# Patient Record
Sex: Female | Born: 1950 | Race: White | Hispanic: No | Marital: Married | Smoking: Never smoker
Health system: Southern US, Community
[De-identification: ages and names within clinical notes are randomized; demographics above are authoritative.]

## PROBLEM LIST (undated history)

## (undated) DIAGNOSIS — I10 Essential (primary) hypertension: Secondary | ICD-10-CM

## (undated) DIAGNOSIS — R233 Spontaneous ecchymoses: Secondary | ICD-10-CM

## (undated) DIAGNOSIS — Z98811 Dental restoration status: Secondary | ICD-10-CM

## (undated) DIAGNOSIS — I498 Other specified cardiac arrhythmias: Secondary | ICD-10-CM

## (undated) DIAGNOSIS — I499 Cardiac arrhythmia, unspecified: Secondary | ICD-10-CM

## (undated) DIAGNOSIS — Z8719 Personal history of other diseases of the digestive system: Secondary | ICD-10-CM

## (undated) DIAGNOSIS — R238 Other skin changes: Secondary | ICD-10-CM

## (undated) DIAGNOSIS — C50919 Malignant neoplasm of unspecified site of unspecified female breast: Secondary | ICD-10-CM

## (undated) DIAGNOSIS — J302 Other seasonal allergic rhinitis: Secondary | ICD-10-CM

## (undated) DIAGNOSIS — Z923 Personal history of irradiation: Secondary | ICD-10-CM

## (undated) DIAGNOSIS — N6022 Fibroadenosis of left breast: Secondary | ICD-10-CM

## (undated) HISTORY — DX: Other specified cardiac arrhythmias: I49.8

## (undated) HISTORY — PX: ABDOMINAL HYSTERECTOMY: SHX81

## (undated) HISTORY — DX: Cardiac arrhythmia, unspecified: I49.9

## (undated) HISTORY — PX: CATARACT EXTRACTION W/ INTRAOCULAR LENS  IMPLANT, BILATERAL: SHX1307

## (undated) HISTORY — DX: Essential (primary) hypertension: I10

## (undated) HISTORY — PX: TONSILLECTOMY: SUR1361

---

## 1999-04-29 ENCOUNTER — Other Ambulatory Visit: Admission: RE | Admit: 1999-04-29 | Discharge: 1999-04-29 | Payer: Self-pay | Admitting: Obstetrics and Gynecology

## 1999-07-04 ENCOUNTER — Encounter: Admission: RE | Admit: 1999-07-04 | Discharge: 1999-10-02 | Payer: Self-pay | Admitting: Obstetrics and Gynecology

## 2000-06-25 ENCOUNTER — Other Ambulatory Visit: Admission: RE | Admit: 2000-06-25 | Discharge: 2000-06-25 | Payer: Self-pay | Admitting: Obstetrics and Gynecology

## 2002-09-15 ENCOUNTER — Other Ambulatory Visit: Admission: RE | Admit: 2002-09-15 | Discharge: 2002-09-15 | Payer: Self-pay | Admitting: Obstetrics and Gynecology

## 2004-10-06 ENCOUNTER — Encounter: Admission: RE | Admit: 2004-10-06 | Discharge: 2004-10-06 | Payer: Self-pay | Admitting: Family Medicine

## 2006-01-18 ENCOUNTER — Other Ambulatory Visit: Admission: RE | Admit: 2006-01-18 | Discharge: 2006-01-18 | Payer: Self-pay | Admitting: Obstetrics and Gynecology

## 2008-10-16 ENCOUNTER — Encounter: Admission: RE | Admit: 2008-10-16 | Discharge: 2008-10-16 | Payer: Self-pay | Admitting: Emergency Medicine

## 2009-02-04 ENCOUNTER — Encounter: Admission: RE | Admit: 2009-02-04 | Discharge: 2009-02-04 | Payer: Self-pay | Admitting: Sports Medicine

## 2011-10-20 ENCOUNTER — Emergency Department (INDEPENDENT_AMBULATORY_CARE_PROVIDER_SITE_OTHER): Payer: BC Managed Care – PPO

## 2011-10-20 ENCOUNTER — Encounter (HOSPITAL_BASED_OUTPATIENT_CLINIC_OR_DEPARTMENT_OTHER): Payer: Self-pay | Admitting: *Deleted

## 2011-10-20 ENCOUNTER — Emergency Department (HOSPITAL_BASED_OUTPATIENT_CLINIC_OR_DEPARTMENT_OTHER)
Admission: EM | Admit: 2011-10-20 | Discharge: 2011-10-20 | Disposition: A | Discharge status: Home / Self Care | Payer: BC Managed Care – PPO | Attending: Emergency Medicine | Admitting: Emergency Medicine

## 2011-10-20 DIAGNOSIS — R1032 Left lower quadrant pain: Secondary | ICD-10-CM

## 2011-10-20 DIAGNOSIS — K5732 Diverticulitis of large intestine without perforation or abscess without bleeding: Secondary | ICD-10-CM | POA: Insufficient documentation

## 2011-10-20 DIAGNOSIS — Z79899 Other long term (current) drug therapy: Secondary | ICD-10-CM | POA: Insufficient documentation

## 2011-10-20 LAB — DIFFERENTIAL
Basophils Absolute: 0.1 10*3/uL (ref 0.0–0.1)
Basophils Relative: 2 % — ABNORMAL HIGH (ref 0–1)
Eosinophils Absolute: 0.2 10*3/uL (ref 0.0–0.7)
Monocytes Absolute: 0.5 10*3/uL (ref 0.1–1.0)
Monocytes Relative: 9 % (ref 3–12)
Neutrophils Relative %: 51 % (ref 43–77)

## 2011-10-20 LAB — CBC
Hemoglobin: 13.6 g/dL (ref 12.0–15.0)
MCH: 33.3 pg (ref 26.0–34.0)
MCHC: 34.3 g/dL (ref 30.0–36.0)
RDW: 11.9 % (ref 11.5–15.5)

## 2011-10-20 LAB — URINALYSIS, ROUTINE W REFLEX MICROSCOPIC
Bilirubin Urine: NEGATIVE
Ketones, ur: 15 mg/dL — AB
Leukocytes, UA: NEGATIVE
Nitrite: NEGATIVE
Protein, ur: NEGATIVE mg/dL
pH: 6.5 (ref 5.0–8.0)

## 2011-10-20 LAB — OCCULT BLOOD X 1 CARD TO LAB, STOOL: Fecal Occult Bld: NEGATIVE

## 2011-10-20 LAB — COMPREHENSIVE METABOLIC PANEL
AST: 25 U/L (ref 0–37)
Albumin: 4.4 g/dL (ref 3.5–5.2)
BUN: 9 mg/dL (ref 6–23)
Creatinine, Ser: 0.7 mg/dL (ref 0.50–1.10)
Potassium: 3.5 mEq/L (ref 3.5–5.1)
Total Protein: 7.7 g/dL (ref 6.0–8.3)

## 2011-10-20 MED ORDER — ONDANSETRON 8 MG PO TBDP: 8.0000 mg | ORAL_TABLET | Freq: Three times a day (TID) | ORAL | Status: AC | PRN

## 2011-10-20 MED ORDER — ONDANSETRON HCL 4 MG/2ML IJ SOLN
4.0000 mg | Freq: Once | INTRAMUSCULAR | Status: AC
Start: 2011-10-20 — End: 2011-10-20
  Administered 2011-10-20: 4 mg via INTRAVENOUS
  Filled 2011-10-20: qty 2

## 2011-10-20 MED ORDER — SODIUM CHLORIDE 0.9 % IV BOLUS (SEPSIS)
1000.0000 mL | Freq: Once | INTRAVENOUS | Status: AC
  Administered 2011-10-20: 1000 mL via INTRAVENOUS

## 2011-10-20 MED ORDER — HYDROMORPHONE HCL PF 1 MG/ML IJ SOLN
1.0000 mg | Freq: Once | INTRAMUSCULAR | Status: AC
  Administered 2011-10-20: 1 mg via INTRAVENOUS
  Filled 2011-10-20: qty 1

## 2011-10-20 MED ORDER — IOHEXOL 300 MG/ML  SOLN
100.0000 mL | Freq: Once | INTRAMUSCULAR | Status: AC | PRN
  Administered 2011-10-20: 100 mL via INTRAVENOUS

## 2011-10-20 MED ORDER — IOHEXOL 300 MG/ML  SOLN
40.0000 mL | Freq: Once | INTRAMUSCULAR | Status: AC | PRN
  Administered 2011-10-20: 40 mL via ORAL

## 2011-10-20 MED ORDER — OXYCODONE-ACETAMINOPHEN 5-325 MG PO TABS
1.0000 | ORAL_TABLET | Freq: Once | ORAL | Status: AC
  Administered 2011-10-20: 1 via ORAL
  Filled 2011-10-20: qty 1

## 2011-10-20 MED ORDER — OXYCODONE-ACETAMINOPHEN 5-325 MG PO TABS: 2.0000 | ORAL_TABLET | ORAL | Status: AC | PRN

## 2011-10-20 NOTE — ED Provider Notes (Addendum)
History     CSN: [id number]  Arrival date & time 10/20/11  2002   First MD Initiated Contact with Patient 10/20/11 2026      Chief Complaint  Patient presents with  . Abdominal Pain    (Consider location/radiation/quality/duration/timing/severity/associated sxs/prior treatment) HPI Patient with nausea and abdominal pain beginning 12/30.  Patient seen at [facility] walk in clinic that same day and diagnose with diverticulitis after blood work and urinalysis.  Patient placed on cipro and flagyl for 10 days.  The sharp pain subsided and continues to have llq pain.  Patient seen for follow up 1/11 by her pmd Dr. Paulino Rily.  Patient continued to have pain and was told to call in two weeks.  Yesterday pain worsened.  Called her doctor yesterday morning and they called back and told her to go to ed- patient did not go due to weather.  Pain continues sever todaya nd went to walk in clinic and they sent here for test.  Current pain 7, has been 8. Past Medical History  Diagnosis Date  . Diverticular disease     Past Surgical History  Procedure Date  . Abdominal hysterectomy   . Tonsillectomy     No family history on file.  History  Substance Use Topics  . Smoking status: Never Smoker   . Smokeless tobacco: Not on file  . Alcohol Use: No    OB History    Grav Para Term Preterm Abortions TAB SAB Ect Mult Living                  Review of Systems  Constitutional: Positive for appetite change. Negative for fever and unexpected weight change.  Respiratory: Negative for cough and shortness of breath.   Genitourinary: Negative for dysuria and difficulty urinating.  All other systems reviewed and are negative.    Allergies  Atenolol hydrochloride; Bisoprolol; and Prednisone  Home Medications   Current Outpatient Rx  Name Route Sig Dispense Refill  . ALPRAZOLAM 0.25 MG PO TABS Oral Take 0.125-0.25 mg by mouth 3 (three) times daily. Take 1/2 tab in the morning and afternoon and take 1  tab at bedtime    . B COMPLEX PO TABS Oral Take 1 tablet by mouth daily.    Marland Kitchen CALCIUM CITRATE 250 MG PO TABS Oral Take 250 mg by mouth 4 (four) times daily.    Marland Kitchen VITAMIN D-3 5000 UNITS PO TABS Oral Take 1 tablet by mouth daily.    Marland Kitchen ESTROGENS CONJUGATED 0.625 MG PO TABS Oral Take 0.625 mg by mouth daily. Take daily for 21 days then do not take for 7 days.    . RESTORA PO CAPS Oral Take 1 capsule by mouth daily.      BP 154/64  Pulse 80  Temp(Src) 97.8 F (36.6 C) (Oral)  Resp 20  SpO2 100%  Physical Exam  Nursing note and vitals reviewed. Constitutional: She is oriented to person, place, and time. She appears well-developed and well-nourished.  HENT:  Head: Normocephalic and atraumatic.  Cardiovascular: Normal rate.   Pulmonary/Chest: Effort normal and breath sounds normal.  Abdominal: There is tenderness.    Musculoskeletal: Normal range of motion.  Neurological: She is alert and oriented to person, place, and time. She has normal reflexes.  Skin: Skin is warm and dry.  Psychiatric: She has a normal mood and affect. Her behavior is normal. Judgment and thought content normal.    ED Course  Procedures (including critical care time)  Labs Reviewed  URINALYSIS, ROUTINE W REFLEX MICROSCOPIC - Abnormal; Notable for the following:    Ketones, ur 15 (*)    All other components within normal limits   No results found.   No diagnosis found.         Patient without acute abnormality on labs or ct- she feels much better after one dose of pain medicine and has taken po without emesis.     Hilario Quarry, MD 10/21/11 7829  Hilario Quarry, MD 10/21/11 5621  Hilario Quarry, MD 10/21/11 [phone]

## 2011-10-20 NOTE — ED Notes (Signed)
Abdominal pain lower left quad. Pain started December 30th. This is her 4th MD visit per pt. She was diagnosed with diverticulitis.

## 2012-04-15 ENCOUNTER — Other Ambulatory Visit: Payer: Self-pay

## 2012-05-29 ENCOUNTER — Telehealth: Payer: Self-pay | Admitting: Obstetrics and Gynecology

## 2012-05-29 MED ORDER — ESTROGENS CONJUGATED 0.625 MG PO TABS: 0.6250 mg | ORAL_TABLET | Freq: Every day | ORAL | Status: DC

## 2012-05-29 NOTE — Telephone Encounter (Signed)
Refill premarin. AEX sch for 06-18-12.  ld

## 2012-06-07 ENCOUNTER — Encounter: Payer: Self-pay | Admitting: Gastroenterology

## 2012-06-14 ENCOUNTER — Ambulatory Visit (INDEPENDENT_AMBULATORY_CARE_PROVIDER_SITE_OTHER): Payer: BC Managed Care – PPO | Admitting: Gastroenterology

## 2012-06-14 ENCOUNTER — Encounter: Payer: Self-pay | Admitting: Gastroenterology

## 2012-06-14 VITALS — BP 128/80 | HR 60 | Ht 61.0 in | Wt 115.0 lb

## 2012-06-14 DIAGNOSIS — N949 Unspecified condition associated with female genital organs and menstrual cycle: Secondary | ICD-10-CM

## 2012-06-14 DIAGNOSIS — R102 Pelvic and perineal pain: Secondary | ICD-10-CM

## 2012-06-14 MED ORDER — HYOSCYAMINE SULFATE ER 0.375 MG PO TB12: 0.3750 mg | ORAL_TABLET | Freq: Two times a day (BID) | ORAL | Status: DC

## 2012-06-14 NOTE — Progress Notes (Signed)
HPI: This is a  pleasant 61 year old woman whom I am meeting for the first time today  She had a Colonoscopy 03/2010 with Dr. Loreta Ave for [facility] screening.  Normal examination to TI, was recommended recall colonoscoyp at 10 year interval.  Has left sided abdominal burning, "on fire."  Worse with eating any types of food.   Hurts very lower left, almost low pelvis.  The burning is nearly constant, 80% of the time. Started December 30th.  Having a BM helps the pains.  No bowel changes.  No nausea or vomiting.    Was given an antibiotic for this pain 8-9 months ago at [facility] walk in clinic, was told she had an infection based on bloodwork.  2-3 weeks later the pains returned (was done with antibiotic course) ; repeat labs (cbc, cmet, UA were all normal) and CT scan then abd pelvis with IV and po contrast was all normal.  Was given pain med and sent home.  No fevers or chills, not constipated.  NO overt gi bleeding.      Had hysterectomy, ovaries still in place.  Feels scraping, burning, irritating feeling, clearly worse with eating.    Review of systems: Pertinent positive and negative review of systems were noted in the above HPI section. Complete review of systems was performed and was otherwise normal.    Past Medical History  Diagnosis Date  . Diverticular disease   . H/O varicella   . H/O sinusitis 2011  . Hayfever 2011  . Left hip pain 2011  . H/O mumps   . Yeast vaginitis 2006    Past Surgical History  Procedure Date  . Tonsillectomy   . Abdominal hysterectomy     Current Outpatient Prescriptions  Medication Sig Dispense Refill  . ALPRAZolam (XANAX) 0.25 MG tablet Take 0.125-0.25 mg by mouth 3 (three) times daily. Take 1/2 tab in the morning and afternoon and take 1 tab at bedtime      . b complex vitamins tablet Take 1 tablet by mouth daily.      . Calcium Citrate 250 MG TABS Take 250 mg by mouth 4 (four) times daily.      . Cholecalciferol (VITAMIN D-3) 5000 UNITS TABS  Take 1 tablet by mouth daily.      Marland Kitchen estrogens, conjugated, (PREMARIN) 0.625 MG tablet Take 1 tablet (0.625 mg total) by mouth daily. Take daily for 21 days then do not take for 7 days.  21 tablet  0    Allergies as of 06/14/2012 - Review Complete 06/14/2012  Allergen Reaction Noted  . Atenolol hydrochloride  10/20/2011  . Bisoprolol  10/20/2011  . Prednisone  10/20/2011    Family History  Problem Relation Age of Onset  . Hypertension Sister   . Dementia Father     History   Social History  . Marital Status: Single    Spouse Name: N/A    Number of Children: N/A  . Years of Education: N/A   Occupational History  . 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 Smoking status: Never Smoker   . Smokeless tobacco: Never Used  . Alcohol Use: No  . Drug Use: No  . Sexually Active:    Other Topics Concern  . Not on file   Social History Narrative  . No narrative on file       Physical Exam: BP 128/80  Pulse 60  Ht 5\' 1"  (1.549 m)  Wt 115 lb (52.164 kg)  BMI 21.73 kg/m2 Constitutional: generally well-appearing Psychiatric: alert and oriented x3 Eyes: extraocular movements intact Mouth: oral pharynx moist, no lesions Neck: supple no lymphadenopathy Cardiovascular: heart regular rate and rhythm Lungs: clear to auscultation bilaterally Abdomen: soft, very mildly tender in the low left pelvis, nondistended, no obvious ascites, no peritoneal signs, normal bowel sounds Extremities: no lower extremity edema bilaterally Skin: no lesions on visible extremities    Assessment and plan: 61 y.o. female with  left sided pelvic pain, worse after eating  It is pretty unusual to have a pelvic pain that is clearly worse after eating.  Stomach and pelvic organs are generally quite removed from each other. She is very clear that this is the case however. She is seeing a gynecologist next week and I'm recommending she mention her low pelvic pain to then to  consider pelvic ultrasound. He did have her uterus removed years ago but still has ovaries. Perhaps this is an ovarian etiology. I'm giving her antispasm medicine to take twice daily and she will return to see me in 4 weeks' time. She had a colonoscopy by a different gastroenterologist in town 2 years ago and I don't think that needs to be repeated just now. Since her pain is postprandial, I would consider EGD if there is still no clear etiology after gynecologic visit and trial of antispasm medicines.

## 2012-06-14 NOTE — Patient Instructions (Addendum)
Angela Lay, MD gynecologist appointment already set for next week, please go ahead with that appointment and mention your pelvic pain.  This may be gynecologic related, consider pelvic ultrasound. Trial of antispasm medicine, take one pill twice daily. Return to see Dr. Christella Hartigan in 4-5 weeks to follow up on your We will call Dr. Paulino Rily office to get your recent bloodwork sent over. You may need upper endoscopy for the post prandial symptoms.

## 2012-06-18 ENCOUNTER — Other Ambulatory Visit: Payer: Self-pay | Admitting: Obstetrics and Gynecology

## 2012-06-18 ENCOUNTER — Ambulatory Visit (INDEPENDENT_AMBULATORY_CARE_PROVIDER_SITE_OTHER): Payer: BC Managed Care – PPO

## 2012-06-18 ENCOUNTER — Encounter: Payer: Self-pay | Admitting: Obstetrics and Gynecology

## 2012-06-18 ENCOUNTER — Ambulatory Visit (INDEPENDENT_AMBULATORY_CARE_PROVIDER_SITE_OTHER): Payer: BC Managed Care – PPO | Admitting: Obstetrics and Gynecology

## 2012-06-18 VITALS — BP 104/58 | Ht 62.0 in | Wt 112.0 lb

## 2012-06-18 DIAGNOSIS — R102 Pelvic and perineal pain: Secondary | ICD-10-CM

## 2012-06-18 DIAGNOSIS — N949 Unspecified condition associated with female genital organs and menstrual cycle: Secondary | ICD-10-CM

## 2012-06-18 DIAGNOSIS — Z01419 Encounter for gynecological examination (general) (routine) without abnormal findings: Secondary | ICD-10-CM

## 2012-06-18 MED ORDER — ESTROGENS CONJUGATED 0.625 MG PO TABS: 0.6250 mg | ORAL_TABLET | Freq: Every day | ORAL | Status: DC

## 2012-06-18 NOTE — Progress Notes (Signed)
The patient is taking hormone replacement therapy The patient  is taking a Calcium supplement. Post-menopausal bleeding:no Hysterectomy   Last Pap: was normal February  2010 Last mammogram: was normal September  2012 Last DEXA scan : T= 2.03 Feb 2006  Last colonoscopy:normal May 2011  Urinary symptoms: none Normal bowel movements: Yes Reports abuse at home: No  Subjective:    Angela Buchanan is a 61 y.o. female No obstetric history on file. who presents for annual exam.  The patient c/o LLQ pain: since 09/30/12, burn,6/10, continuous, improved with Levbid, worsened by eating. Sometimes associated with nausea. Saw Dr Christella Hartigan last week, no testing, says he suspects it is GYN origin.  The following portions of the patient's history were reviewed and updated as appropriate: allergies, current medications, past family history, past medical history, past social history, past surgical history and problem list.  Review of Systems Pertinent items are noted in HPI. Gastrointestinal:No change in bowel habits, no abdominal pain, no rectal bleeding Genitourinary:negative for dysuria, frequency, hematuria, nocturia and urinary incontinence    Objective:     BP 104/58  Ht 5\' 2"  (1.575 m)  Wt 112 lb (50.803 kg)  BMI 20.49 kg/m2  Weight:  Wt Readings from Last 1 Encounters:  06/18/12 112 lb (50.803 kg)     BMI: Body mass index is 20.49 kg/(m^2). General Appearance: Alert, appropriate appearance for age. No acute distress HEENT: Grossly normal Neck / Thyroid: Supple, no masses, nodes or enlargement Lungs: clear to auscultation bilaterally Back: No CVA tenderness Breast Exam: No masses or nodes.No dimpling, nipple retraction or discharge. Cardiovascular: Regular rate and rhythm. S1, S2, no murmur Gastrointestinal: Soft, non-tender, no masses or organomegaly Pelvic Exam:VV normal. Pelvic reveals normal adnexa Rectovaginal: normal rectal, no masses Lymphatic Exam: Non-palpable nodes in neck,  clavicular, axillary, or inguinal regions Skin: no rash or abnormalities Neurologic: Normal gait and speech, no tremor  Psychiatric: Alert and oriented, appropriate affect.     Assessment:    Pelvic pain most likely GI in origin. Pelvic ultrasound today is normal   Plan:   mammogram return annually or prn Pt to return to GI for management of pain Follow-up:  for annual exam

## 2012-06-19 ENCOUNTER — Telehealth: Payer: Self-pay | Admitting: Obstetrics and Gynecology

## 2012-06-19 NOTE — Telephone Encounter (Signed)
LINDA/RECIEVED

## 2012-06-19 NOTE — Telephone Encounter (Signed)
TC TO PT PHARMACY REGARDING RX PREMARIN 0.625 MG. WITH THE CORRECTED DOSAGE. PREMARIN 0.625MG  #30 1 PO DAILY WITH REFILLS FOR ONE YEAR. PHARMACY UNDERSTANDS.

## 2012-06-21 ENCOUNTER — Telehealth: Payer: Self-pay | Admitting: Gastroenterology

## 2012-06-21 NOTE — Telephone Encounter (Signed)
Reviewed outside labs. Dated 06/06/2012. CBC was essentially normal, complete metabolic profile was normal

## 2012-07-23 ENCOUNTER — Ambulatory Visit: Payer: BC Managed Care – PPO | Admitting: Gastroenterology

## 2013-12-02 ENCOUNTER — Other Ambulatory Visit: Payer: Self-pay | Admitting: Cardiology

## 2013-12-02 MED ORDER — LISINOPRIL 20 MG PO TABS: 20.0000 mg | ORAL_TABLET | Freq: Every day | ORAL | Status: DC

## 2014-03-18 ENCOUNTER — Ambulatory Visit: Payer: BC Managed Care – PPO | Admitting: Neurology

## 2014-04-07 ENCOUNTER — Encounter: Payer: Self-pay | Admitting: Interventional Cardiology

## 2014-04-17 ENCOUNTER — Ambulatory Visit: Payer: BC Managed Care – PPO | Admitting: Cardiovascular Disease

## 2014-04-22 ENCOUNTER — Ambulatory Visit: Payer: BC Managed Care – PPO | Admitting: Cardiovascular Disease

## 2014-05-05 ENCOUNTER — Ambulatory Visit: Payer: BC Managed Care – PPO | Admitting: Interventional Cardiology

## 2014-06-03 ENCOUNTER — Ambulatory Visit: Payer: BC Managed Care – PPO | Admitting: Cardiovascular Disease

## 2014-07-14 ENCOUNTER — Other Ambulatory Visit: Payer: Self-pay | Admitting: Family Medicine

## 2014-07-14 ENCOUNTER — Ambulatory Visit
Admission: RE | Admit: 2014-07-14 | Discharge: 2014-07-14 | Disposition: A | Discharge status: Home / Self Care | Payer: BC Managed Care – PPO | Source: Ambulatory Visit | Attending: Family Medicine | Admitting: Family Medicine

## 2014-07-14 DIAGNOSIS — R079 Chest pain, unspecified: Secondary | ICD-10-CM

## 2014-07-22 ENCOUNTER — Ambulatory Visit (INDEPENDENT_AMBULATORY_CARE_PROVIDER_SITE_OTHER): Payer: BC Managed Care – PPO | Admitting: Cardiovascular Disease

## 2014-07-22 ENCOUNTER — Encounter: Payer: Self-pay | Admitting: Cardiovascular Disease

## 2014-07-22 VITALS — BP 148/70 | HR 61 | Ht 62.0 in | Wt 114.1 lb

## 2014-07-22 DIAGNOSIS — R079 Chest pain, unspecified: Secondary | ICD-10-CM | POA: Insufficient documentation

## 2014-07-22 DIAGNOSIS — I10 Essential (primary) hypertension: Secondary | ICD-10-CM

## 2014-07-22 DIAGNOSIS — Z8249 Family history of ischemic heart disease and other diseases of the circulatory system: Secondary | ICD-10-CM | POA: Insufficient documentation

## 2014-07-22 NOTE — Progress Notes (Signed)
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 She was referred by Angela Buchanan for cardiovascular evaluation because of chest pain. Risk factors include treated hypertension family history with a brother who had a myocardial infarction at age 34. She's never had a heart attack or stroke. She was evaluated by Angela Buchanan several years ago who did a an exercise stress test that showed ST segment depression. A followup imaging study was recommended but never pursued. Her pain occurs daily. It is substernal. It does not radiate.it is worse when she sits up and not to sleep on by exertion.   Current Outpatient Prescriptions  Medication Sig Dispense Refill  . ALPRAZolam (XANAX) 0.25 MG tablet Take 0.125-0.25 mg by mouth as needed.       Marland Kitchen b complex vitamins tablet Take 1 tablet by mouth daily.      . Calcium Citrate 250 MG TABS Take 250 mg by mouth 4 (four) times daily.      . Cholecalciferol (VITAMIN D-3) 5000 UNITS TABS Take 1 tablet by mouth daily.      Marland Kitchen estrogens, conjugated, (PREMARIN) 0.625 MG tablet Take 1 tablet (0.625 mg total) by mouth daily. Take daily for 21 days then do not take for 7 days.  21 tablet  11  . lisinopril (PRINIVIL,ZESTRIL) 20 MG tablet Take 1 tablet (20 mg total) by mouth daily.  90 tablet  1  . PAZEO 0.7 % SOLN Place 1 drop into the right eye as needed.       No current facility-administered medications for this visit.    Allergies  Allergen Reactions  . Atenolol Hydrochloride     Gas, heartburn, headache  . Bisoprolol     Feels weird  . Prednisone     Feels weird     History   Social History  . Marital Status: Single    Spouse Name: N/A    Number of Children: N/A  . Years of Education: N/A    Occupational History  . 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 Smoking status: Never Smoker   . Smokeless tobacco: Never Used  . Alcohol Use: No  . Drug Use: No  . Sexual Activity: No     Comment: hyst   Other Topics Concern  . Not on file   Social History Narrative  . No narrative on file     Review of Systems: General: negative for chills, fever, night sweats or weight changes.  Cardiovascular: negative for chest pain, dyspnea on exertion, edema, orthopnea, palpitations, paroxysmal nocturnal dyspnea or shortness of breath Dermatological: negative for rash Respiratory: negative for cough or wheezing Urologic: negative for hematuria Abdominal: negative for nausea, vomiting, diarrhea, bright red blood per rectum, melena, or hematemesis Neurologic: negative for visual changes, syncope, or dizziness All other systems reviewed and are otherwise negative except as noted above.    Blood pressure 148/70, pulse 61, height 5\' 2"  (1.575 m), weight 114 lb 1.6 oz (51.755 kg).  General appearance: alert and no distress Neck: no adenopathy, no carotid bruit, no JVD, supple, symmetrical, trachea midline and thyroid not enlarged, symmetric, no tenderness/mass/nodules Lungs: clear to auscultation bilaterally Heart: regular rate and rhythm, S1, S2 normal, no murmur, click, rub  or gallop Extremities: extremities normal, atraumatic, no cyanosis or edema  EKG normal sinus rhythm at 61 with bigeminal PVCs  ASSESSMENT AND PLAN:   Chest pain The patient has had chest pain for last several years. It occurs daily and worse when she sits up. She did have a exercise stress test performed by Angela Buchanan 10/29/12 that did show ST segment depression and a nuclear imaging study was recommended but never pursued. Her risk factors for heart disease include family history and hypertension. I'm going to get a from a cardiac Myoview stress test to rule out an ischemic  etiology.      Angela Harp MD FACP,FACC,FAHA, [facility] 07/22/2014 12:07 PM

## 2014-07-22 NOTE — Assessment & Plan Note (Signed)
The patient has had chest pain for last several years. It occurs daily and worse when she sits up. She did have a exercise stress test performed by Dr. Irish Lack 10/29/12 that did show ST segment depression and a nuclear imaging study was recommended but never pursued. Her risk factors for heart disease include family history and hypertension. I'm going to get a from a cardiac Myoview stress test to rule out an ischemic etiology.

## 2014-07-22 NOTE — Patient Instructions (Signed)
  We will see you back in follow up as needed.   Dr Berry has ordered: Lexiscan Myoview- this is a test that looks at the blood flow to your heart muscle.  It takes approximately 2 1/2 hours. Please follow instruction sheet, as given.      

## 2014-07-24 ENCOUNTER — Telehealth (HOSPITAL_COMMUNITY): Payer: Self-pay | Admitting: *Deleted

## 2014-07-28 ENCOUNTER — Telehealth (HOSPITAL_COMMUNITY): Payer: Self-pay

## 2014-07-28 NOTE — Telephone Encounter (Signed)
Encounter complete. 

## 2014-07-30 ENCOUNTER — Ambulatory Visit (HOSPITAL_COMMUNITY)
Admission: RE | Admit: 2014-07-30 | Discharge: 2014-07-30 | Disposition: A | Discharge status: Home / Self Care | Payer: BC Managed Care – PPO | Source: Ambulatory Visit | Attending: Cardiology | Admitting: Cardiology

## 2014-07-30 DIAGNOSIS — R079 Chest pain, unspecified: Secondary | ICD-10-CM | POA: Diagnosis present

## 2014-07-30 DIAGNOSIS — R42 Dizziness and giddiness: Secondary | ICD-10-CM | POA: Diagnosis not present

## 2014-07-30 DIAGNOSIS — R5383 Other fatigue: Secondary | ICD-10-CM | POA: Insufficient documentation

## 2014-07-30 DIAGNOSIS — R0600 Dyspnea, unspecified: Secondary | ICD-10-CM | POA: Diagnosis not present

## 2014-07-30 DIAGNOSIS — R002 Palpitations: Secondary | ICD-10-CM | POA: Diagnosis not present

## 2014-07-30 DIAGNOSIS — I10 Essential (primary) hypertension: Secondary | ICD-10-CM | POA: Diagnosis not present

## 2014-07-30 DIAGNOSIS — R0602 Shortness of breath: Secondary | ICD-10-CM | POA: Insufficient documentation

## 2014-07-30 DIAGNOSIS — Z8249 Family history of ischemic heart disease and other diseases of the circulatory system: Secondary | ICD-10-CM | POA: Diagnosis not present

## 2014-07-30 MED ORDER — TECHNETIUM TC 99M SESTAMIBI GENERIC - CARDIOLITE
10.6000 | Freq: Once | INTRAVENOUS | Status: AC | PRN
  Administered 2014-07-30: 11 via INTRAVENOUS

## 2014-07-30 MED ORDER — REGADENOSON 0.4 MG/5ML IV SOLN
0.4000 mg | Freq: Once | INTRAVENOUS | Status: AC
  Administered 2014-07-30: 0.4 mg via INTRAVENOUS

## 2014-07-30 MED ORDER — TECHNETIUM TC 99M SESTAMIBI GENERIC - CARDIOLITE
31.6000 | Freq: Once | INTRAVENOUS | Status: AC | PRN
  Administered 2014-07-30: 32 via INTRAVENOUS

## 2014-07-30 NOTE — Procedures (Addendum)
[place]  [facility] [address] [place] [address] [phone]  Cardiology Nuclear Med Study  Angela Buchanan is a 63 y.o. female     MRN : [id number]     DOB: 06/18/1951  Procedure Date: 07/30/2014  Nuclear Med Background Indication for Stress Test:  Evaluation for Ischemia History:  No prior respiratory or cardiac history reported;No priro NUC MPI for comparison. Cardiac Risk Factors: Family History - CAD and Hypertension  Symptoms:  Chest Pain, DOE, Fatigue, Light-Headedness, Palpitations and SOB   Nuclear Pre-Procedure Caffeine/Decaff Intake:  1:00am NPO After: 11am   IV Site: R Forearm  IV 0.9% NS with Angio Cath:  22g  Chest Size (in):  n/a IV Started by: Rolene Course, RN  Height: 5\' 2"  (1.575 m)  Cup Size: B  BMI:  Body mass index is 20.85 kg/(m^2). Weight:  114 lb (51.71 kg)   Tech Comments:  n/a    Nuclear Med Study 1 or 2 day study: 1 day  Stress Test Type:  [place] Provider:  Quay Burow, MD   Resting Radionuclide: Technetium 6m Sestamibi  Resting Radionuclide Dose: 10.6 mCi   Stress Radionuclide:  Technetium 26m Sestamibi  Stress Radionuclide Dose: 31.6 mCi           Stress Protocol Rest HR: 57 Stress HR: 107  Rest BP: 141/88 Stress BP: 152/82  Exercise Time (min): n/a METS: n/a          Dose of Adenosine (mg):  n/a Dose of Lexiscan: 0.4 mg  Dose of Atropine (mg): n/a Dose of Dobutamine: n/a mcg/kg/min (at max HR)  Stress Test Technologist: Mellody Memos, CCT Nuclear Technologist: Imagene Riches, CNMT   Rest Procedure:  Myocardial perfusion imaging was performed at rest 45 minutes following the intravenous administration of Technetium 68m Sestamibi. Stress Procedure:  The patient received IV Lexiscan 0.4 mg over 15-seconds.  Technetium 47m Sestamibi injected at 30-seconds.  There were no significant changes with Lexiscan.  Quantitative spect images were obtained after a 45 minute  delay.  Transient Ischemic Dilatation (Normal <1.22):  1.19  QGS EDV:  n/a ml QGS ESV:  n/a ml LV Ejection Fraction: Study not gated        Rest ECG: NSR - Normal EKG  Stress ECG: No significant change from baseline ECG  QPS Raw Data Images:  Normal; no motion artifact; normal heart/lung ratio. Stress Images:  Normal homogeneous uptake in all areas of the myocardium. Rest Images:  Normal homogeneous uptake in all areas of the myocardium. Subtraction (SDS):  No evidence of ischemia.  Impression Exercise Capacity:  Lexiscan with no exercise. BP Response:  Normal blood pressure response. Clinical Symptoms:  No significant symptoms noted. ECG Impression:  There are scattered PVCs. Comparison with Prior Nuclear Study: No images to compare  Overall Impression:  Normal stress nuclear study.  LV Wall Motion:  Gating not performed secondary to ectopy   Lorretta Harp, MD  07/30/2014 5:09 PM

## 2014-07-31 ENCOUNTER — Encounter: Payer: Self-pay | Admitting: *Deleted

## 2014-08-10 ENCOUNTER — Telehealth: Payer: Self-pay | Admitting: Cardiovascular Disease

## 2014-08-10 NOTE — Telephone Encounter (Signed)
Would like to know the results of her test. Please call  ° ° °Thanks  °

## 2014-08-10 NOTE — Telephone Encounter (Signed)
Left message to call back  

## 2014-08-11 NOTE — Telephone Encounter (Signed)
I spoke with patient.  She received letter saying that stress test was normal. She did request a copy of the test mailed to her. Results mailed to patient.

## 2014-09-17 ENCOUNTER — Encounter: Payer: Self-pay | Admitting: Neurology

## 2014-09-17 ENCOUNTER — Ambulatory Visit (INDEPENDENT_AMBULATORY_CARE_PROVIDER_SITE_OTHER): Payer: BC Managed Care – PPO | Admitting: Neurology

## 2014-09-17 VITALS — BP 144/83 | HR 67 | Ht 61.0 in | Wt 111.0 lb

## 2014-09-17 DIAGNOSIS — H532 Diplopia: Secondary | ICD-10-CM

## 2014-09-17 DIAGNOSIS — R42 Dizziness and giddiness: Secondary | ICD-10-CM

## 2014-09-17 NOTE — Progress Notes (Signed)
PATIENT: Angela Buchanan DOB: 05/04/1951  HISTORICAL  ALAJAH WITMAN is 63 yo right-handed female, referred by her primary care physician Dr. Justin Mend, accompanied by her Husband for evaluation of intermittent horizontal double vision   She had a history of hypertension since 2013, had bilateral cataract surgery in June 2015 by ophthalmologist Dr. Talbert Forest, right eye in June first 2015, she did reported improved vision, 2 weeks later around March 15 2014, while riding as a passenger, she suddenly noticed horizontal double vision of right [place], she continue have intermittent horizontal double vision on extreme gaze to the right, or left, she had left cataract surgery in March 18 2014, which improved her monocular vision,   She only has double vision with both eyes open, always horizontal, she has one episode of transient vertigo in early December 2015, lasting for few minutes, she denies hearing loss, no tinnitus, no droopy eyelid, no bulbar weakness, no gait difficulty,   Laboratory in September 2015 showed normal CBC, CMP, TSH, vitamin D 26.7, mild elevated LDL 122 class Chol 228  Over past 5 years, since 2010, she has intermittent horizontal double vision similar to what she has now, but much less frequent, less severe  REVIEW OF SYSTEMS: Full 14 system review of systems performed and notable only for as above   ALLERGIES: Allergies  Allergen Reactions  . Atenolol Hydrochloride     Gas, heartburn, headache  . Bisoprolol     Feels weird  . Prednisone     Feels weird     HOME MEDICATIONS: Current Outpatient Prescriptions on File Prior to Visit  Medication Sig Dispense Refill  . ALPRAZolam (XANAX) 0.25 MG tablet Take 0.125-0.25 mg by mouth as needed.     Marland Kitchen b complex vitamins tablet Take 1 tablet by mouth daily.    . Calcium Citrate 250 MG TABS Take 250 mg by mouth 4 (four) times daily.    . Cholecalciferol (VITAMIN D-3) 5000 UNITS TABS Take 1 tablet by mouth daily.    Marland Kitchen estrogens,  conjugated, (PREMARIN) 0.625 MG tablet Take 1 tablet (0.625 mg total) by mouth daily. Take daily for 21 days then do not take for 7 days. 21 tablet 11  . lisinopril (PRINIVIL,ZESTRIL) 20 MG tablet Take 1 tablet (20 mg total) by mouth daily. 90 tablet 1  . PAZEO 0.7 % SOLN Place 1 drop into the right eye as needed.     No current facility-administered medications on file prior to visit.    PAST MEDICAL HISTORY: Past Medical History  Diagnosis Date  . Diverticular disease   . H/O varicella   . H/O sinusitis 2011  . Hayfever 2011  . Left hip pain 2011  . H/O mumps   . Yeast vaginitis 2006  . Hypertension   . Family history of heart disease   . Chest pain     PAST SURGICAL HISTORY: Past Surgical History  Procedure Laterality Date  . Tonsillectomy    . Abdominal hysterectomy    . Cataract extraction Bilateral     FAMILY HISTORY: Family History  Problem Relation Age of Onset  . Hypertension Sister   . Dementia Father   . Stroke Sister     SOCIAL HISTORY:  History   Social History  . Marital Status: Single    Spouse Name: N/A    Number of Children: 0  . Years of Education: college   Occupational History  . 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  Smoking status: Never Smoker   . Smokeless tobacco: Never Used  . Alcohol Use: No  . Drug Use: No  . Sexual Activity: No     Comment: hyst   Other Topics Concern  . Not on file   Social History Narrative   Patient lives at home with husband  ([name]).    Patient is rtired caoll   Right handed.   Caffeine 8oz . Caffeine daily     PHYSICAL EXAM   Filed Vitals:   09/17/14 1240  BP: 144/83  Pulse: 67  Height: 5\' 1"  (1.549 m)  Weight: 111 lb (50.349 kg)    Not recorded      Body mass index is 20.98 kg/(m^2).   Generalized: In no acute distress  Neck: Supple, no carotid bruits   Cardiac: Regular rate rhythm  Pulmonary: Clear to auscultation bilaterally  Musculoskeletal: No  deformity  Neurological examination  Mentation: Alert oriented to time, place, history taking, and causual conversation  Cranial nerve II-XII: Pupils were equal round reactive to light. Extraocular movements were full.  Visual field were full on confrontational test. Bilateral fundi were sharp.  Facial sensation and strength were normal.  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 Uvula tongue midline.  Head turning and shoulder shrug and were normal and symmetric.Tongue protrusion into cheek strength was normal.  Motor: Normal tone, bulk and strength, mild neck flexion weakness   Sensory: Intact to fine touch, pinprick, preserved vibratory sensation, and proprioception at toes.  Coordination: Normal finger to nose, heel-to-shin bilaterally there was no truncal ataxia  Gait: Rising up from seated position without assistance, normal stance, without trunk ataxia, moderate stride, good arm swing, smooth turning, able to perform tiptoe, and heel walking without difficulty.   Romberg signs: Negative  Deep tendon reflexes: Brachioradialis 2/2, biceps 2/2, triceps 2/2, patellar 2/2, Achilles 2/2, plantar responses were flexor bilaterally.   DIAGNOSTIC DATA (LABS, IMAGING, TESTING) - I reviewed patient records, labs, notes, testing and imaging myself where available.  Lab Results  Component Value Date   WBC 4.9 10/20/2011   HGB 13.6 10/20/2011   HCT 39.6 10/20/2011   MCV 96.8 10/20/2011   PLT 402* 10/20/2011      Component Value Date/Time   NA 139 10/20/2011 2104   K 3.5 10/20/2011 2104   CL 103 10/20/2011 2104   CO2 25 10/20/2011 2104   GLUCOSE 98 10/20/2011 2104   BUN 9 10/20/2011 2104   CREATININE 0.70 10/20/2011 2104   CALCIUM 9.8 10/20/2011 2104   PROT 7.7 10/20/2011 2104   ALBUMIN 4.4 10/20/2011 2104   AST 25 10/20/2011 2104   ALT 15  10/20/2011 2104   ALKPHOS 63 10/20/2011 2104   BILITOT 0.3 10/20/2011 2104   GFRNONAA >90 10/20/2011 2104   GFRAA >90 10/20/2011 2104    ASSESSMENT AND PLAN  TALISHIA BETZLER is a 63 y.o. female complains of   intermittent binocular horizontal diplopia, especially for vision, consistent bilateral lateral rectus muscle weakness,    1, differentiation diagnosis including manifestation of previous esophoria, increased intracranial pressure, need to rule out central nervous system structural lesion 2, MRI of brain 3, laboratory evaluations including acetylcholine receptor antibody, 4. Return to clinic in 3 4 weeks    Marcial Pacas, M.D. Ph.D.  [facility] [address] [place], [place] [address] [phone]

## 2014-09-23 ENCOUNTER — Telehealth: Payer: Self-pay | Admitting: Neurology

## 2014-09-23 ENCOUNTER — Ambulatory Visit (INDEPENDENT_AMBULATORY_CARE_PROVIDER_SITE_OTHER): Payer: BC Managed Care – PPO

## 2014-09-23 DIAGNOSIS — R42 Dizziness and giddiness: Secondary | ICD-10-CM

## 2014-09-23 DIAGNOSIS — H532 Diplopia: Secondary | ICD-10-CM

## 2014-09-23 NOTE — Telephone Encounter (Signed)
Patient would like a call back regarding her recent lab results. Best number to call is [phone].

## 2014-09-28 ENCOUNTER — Telehealth: Payer: Self-pay | Admitting: Neurology

## 2014-09-28 NOTE — Telephone Encounter (Signed)
Patient is calling because she had an MRI on 09-23-14 and patient has an appointment on 09-29-14 and needs to know if she should reschedule the appointment tomorrow or do we have the MRI results back yet. Please call patient and advise.

## 2014-09-29 ENCOUNTER — Ambulatory Visit (INDEPENDENT_AMBULATORY_CARE_PROVIDER_SITE_OTHER): Payer: BC Managed Care – PPO | Admitting: Neurology

## 2014-09-29 ENCOUNTER — Encounter: Payer: Self-pay | Admitting: Neurology

## 2014-09-29 VITALS — BP 127/75 | HR 60 | Ht 61.0 in | Wt 111.0 lb

## 2014-09-29 DIAGNOSIS — I10 Essential (primary) hypertension: Secondary | ICD-10-CM

## 2014-09-29 DIAGNOSIS — H532 Diplopia: Secondary | ICD-10-CM

## 2014-09-29 NOTE — Progress Notes (Signed)
PATIENT: Angela Buchanan DOB: [birth year]  HISTORICAL  Angela Buchanan is 63 yo right-handed female, referred by her primary care physician Dr. Justin Mend, accompanied by her Husband for evaluation of intermittent horizontal double vision   She had a history of hypertension since 2013, had bilateral cataract surgery in June 2015 by ophthalmologist Dr. Talbert Forest, right eye in June first 2015, she did reported improved vision, 2 weeks later around March 15 2014, while riding as a passenger, she suddenly noticed horizontal double vision of right [place], she continue have intermittent horizontal double vision on extreme gaze to the right, or left, she had left cataract surgery in March 18 2014, which improved her monocular vision,   She only has double vision with both eyes open, always horizontal, she has one episode of transient vertigo in early December 2015, lasting for few minutes, she denies hearing loss, no tinnitus, no droopy eyelid, no bulbar weakness, no gait difficulty,   Laboratory in September 2015 showed normal CBC, CMP, TSH, vitamin D 26.7, mild elevated LDL 122 class Chol 228  Since 2010, she has intermittent horizontal double vision similar to what she has now, but much less frequent, less severe  UPDATE Dec 29th 2015: She continue have intermittent horizontal diplopia, especially when looking to far distance,  MRI of the brain reviewed with patient, mild atrophy, small vessel disease, no acute lesions  Laboratory evaluation showed no significant abnormality, negative acetylcholine receptor antibody,  REVIEW OF SYSTEMS: Full 14 system review of systems performed and notable only for as above   ALLERGIES: Allergies  Allergen Reactions  . Atenolol Hydrochloride     Gas, heartburn, headache  . Bisoprolol     Feels weird  . Prednisone     Feels weird     HOME MEDICATIONS: Current Outpatient Prescriptions on File Prior to Visit  Medication Sig Dispense Refill  . b complex vitamins  tablet Take 1 tablet by mouth daily.    . Calcium Citrate 250 MG TABS Take 250 mg by mouth 4 (four) times daily.    . Cholecalciferol (VITAMIN D-3) 5000 UNITS TABS Take 1 tablet by mouth daily.    Marland Kitchen estrogens, conjugated, (PREMARIN) 0.625 MG tablet Take 1 tablet (0.625 mg total) by mouth daily. Take daily for 21 days then do not take for 7 days. 21 tablet 11  . lisinopril (PRINIVIL,ZESTRIL) 20 MG tablet Take 1 tablet (20 mg total) by mouth daily. 90 tablet 1  . PAZEO 0.7 % SOLN Place 1 drop into the right eye as needed.     No current facility-administered medications on file prior to visit.    PAST MEDICAL HISTORY: Past Medical History  Diagnosis Date  . Diverticular disease   . H/O varicella   . H/O sinusitis 2011  . Hayfever 2011  . Left hip pain 2011  . H/O mumps   . Yeast vaginitis 2006  . Hypertension   . Family history of heart disease   . Chest pain     PAST SURGICAL HISTORY: Past Surgical History  Procedure Laterality Date  . Tonsillectomy    . Abdominal hysterectomy    . Cataract extraction Bilateral     FAMILY HISTORY: Family History  Problem Relation Age of Onset  . Hypertension Sister   . Dementia Father   . Stroke Sister     SOCIAL HISTORY:  History   Social History  . Marital Status: Single    Spouse Name: N/A    Number of Children: 0  .  Years of Education: college   Occupational History  . 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 Smokeless tobacco: Never Used  . Alcohol Use: No  . Drug Use: No  . Sexual Activity: No     Comment: hyst   Other Topics Concern  . Not on file   Social History Narrative   Patient lives at home with husband  ([name]).    Patient is rtired caoll   Right handed.   Caffeine 8oz . Caffeine daily     PHYSICAL EXAM   Filed Vitals:   09/29/14 1019  BP: 127/75  Pulse: 60  Height: 5\' 1"  (1.549 m)  Weight: 111 lb (50.349 kg)    Not recorded       Body mass index is 20.98 kg/(m^2).   Generalized: In no acute distress  Neck: Supple, no carotid bruits   Cardiac: Regular rate rhythm  Pulmonary: Clear to auscultation bilaterally  Musculoskeletal: No deformity  Neurological examination  Mentation: Alert oriented to time, place, history taking, and causual conversation  Cranial nerve II-XII: Pupils were equal round reactive to light. Extraocular movements were full.  Visual field were full on confrontational test. Bilateral fundi were sharp.  Facial sensation and strength were normal.  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 Uvula tongue midline.  Head turning and shoulder shrug and were normal and symmetric.Tongue protrusion into cheek strength was normal.  Motor: Normal tone, bulk and strength, mild neck flexion weakness   Sensory: Intact to fine touch, pinprick, preserved vibratory sensation, and proprioception at toes.  Coordination: Normal finger to nose, heel-to-shin bilaterally there was no truncal ataxia  Gait: Rising up from seated position without assistance, normal stance, without trunk ataxia, moderate stride, good arm swing, smooth turning, able to perform tiptoe, and heel walking without difficulty.   Romberg signs: Negative  Deep tendon reflexes: Brachioradialis 2/2, biceps 2/2, triceps 2/2, patellar 2/2, Achilles 2/2, plantar responses were flexor bilaterally.   DIAGNOSTIC DATA (LABS, IMAGING, TESTING) - I reviewed patient records, labs, notes, testing and imaging myself where available.  Lab Results  Component Value Date   WBC 4.9 10/20/2011   HGB 13.6 10/20/2011   HCT 39.6 10/20/2011   MCV 96.8 10/20/2011   PLT 402* 10/20/2011      Component Value Date/Time   NA 139 10/20/2011 2104   K 3.5 10/20/2011 2104   CL 103 10/20/2011 2104   CO2 25 10/20/2011 2104    GLUCOSE 98 10/20/2011 2104   BUN 9 10/20/2011 2104   CREATININE 0.70 10/20/2011 2104   CALCIUM 9.8 10/20/2011 2104   PROT 7.7 10/20/2011 2104   ALBUMIN 4.4 10/20/2011 2104   AST 25 10/20/2011 2104   ALT 15 10/20/2011 2104   ALKPHOS 63 10/20/2011 2104   BILITOT 0.3 10/20/2011 2104   GFRNONAA >90 10/20/2011 2104   GFRAA >90 10/20/2011 2104    ASSESSMENT AND PLAN  Angela Buchanan is a 63 y.o. female complains of   intermittent binocular horizontal diplopia, especially for vision, consistent bilateral lateral rectus muscle weakness,    1, differentiation diagnosis including manifestation of previous esophoria 2, I will refer her to [place] neuro-ophthalmologist Dr. Jolyn Nap 3. Return to clinic in 6 months   Marcial Pacas, M.D. Ph.D.  [facility] [address] [place], [place] [address] [phone]  [phone] 

## 2014-09-29 NOTE — Telephone Encounter (Signed)
Labs and MRI results were given to the patient.

## 2014-09-29 NOTE — Telephone Encounter (Signed)
Patient kept her follow with Dr.Yan 09-29-2014.

## 2014-10-05 LAB — SEDIMENTATION RATE: Sed Rate: 7 mm/hr (ref 0–40)

## 2014-10-05 LAB — VITAMIN B12: Vitamin B-12: 534 pg/mL (ref 211–946)

## 2014-10-05 LAB — C-REACTIVE PROTEIN: CRP: 3.3 mg/L (ref 0.0–4.9)

## 2014-10-05 LAB — TSH: TSH: 4.03 u[IU]/mL (ref 0.450–4.500)

## 2014-10-05 LAB — ACETYLCHOLINE RECEPTOR, BINDING: AChR Binding Ab, Serum: <0.03 nmol/L (ref 0.00–0.24)

## 2014-10-05 LAB — RPR: RPR: NONREACTIVE

## 2014-10-05 LAB — ACETYLCHOLINE RECEPTOR, MODULATING

## 2014-10-05 LAB — LYME, TOTAL AB TEST/REFLEX: Lyme IgG/IgM Ab: <0.91 {ISR} (ref 0.00–0.90)

## 2014-10-05 LAB — ANA W/REFLEX IF POSITIVE: ANA: NEGATIVE

## 2014-10-06 ENCOUNTER — Telehealth: Payer: Self-pay | Admitting: *Deleted

## 2014-10-06 NOTE — Telephone Encounter (Signed)
[place] calling from Dr Lorrene Reid office [phone]) stating that he reviewed patient's medical record and he cannot see patient any sooner than scheduled time 01/15/15, however if Dr Krista Blue feels that patient still needs to be seen sooner than this patient can be referred to Dr Johney Maine in [facility], which is the same specialty as Dr Hassell Done.

## 2014-10-06 NOTE — Telephone Encounter (Signed)
Dana: discuss with patient above info, if she wants to See Dr. Johney Maine,  [place], [place] [place], [place] [address] Phone: [phone] Fax: [phone]  Refax the refer, if she wants to stay with Dr. Hassell Done, the appt will be at April 2016, [place] to wait till then

## 2014-10-06 NOTE — Telephone Encounter (Signed)
Patient calling stating that someone called her, call was transferred to [facility]

## 2014-10-08 ENCOUNTER — Other Ambulatory Visit (HOSPITAL_COMMUNITY): Payer: Self-pay | Admitting: Obstetrics and Gynecology

## 2014-10-08 DIAGNOSIS — Z1231 Encounter for screening mammogram for malignant neoplasm of breast: Secondary | ICD-10-CM

## 2014-10-08 NOTE — Telephone Encounter (Signed)
Spoke to [place] and she has handled this Patient is aware of the process and she is fine.

## 2014-10-14 ENCOUNTER — Ambulatory Visit (HOSPITAL_COMMUNITY)
Admission: RE | Admit: 2014-10-14 | Discharge: 2014-10-14 | Disposition: A | Discharge status: Home / Self Care | Payer: BC Managed Care – PPO | Source: Ambulatory Visit | Attending: Obstetrics and Gynecology | Admitting: Obstetrics and Gynecology

## 2014-10-14 DIAGNOSIS — R928 Other abnormal and inconclusive findings on diagnostic imaging of breast: Secondary | ICD-10-CM | POA: Diagnosis not present

## 2014-10-14 DIAGNOSIS — Z1231 Encounter for screening mammogram for malignant neoplasm of breast: Secondary | ICD-10-CM | POA: Diagnosis present

## 2014-10-15 ENCOUNTER — Other Ambulatory Visit: Payer: Self-pay | Admitting: Obstetrics and Gynecology

## 2014-10-15 DIAGNOSIS — R928 Other abnormal and inconclusive findings on diagnostic imaging of breast: Secondary | ICD-10-CM

## 2014-10-26 ENCOUNTER — Other Ambulatory Visit: Payer: BC Managed Care – PPO

## 2014-11-03 ENCOUNTER — Other Ambulatory Visit: Payer: BC Managed Care – PPO

## 2015-01-25 DIAGNOSIS — H50011 Monocular esotropia, right eye: Secondary | ICD-10-CM | POA: Insufficient documentation

## 2015-03-31 ENCOUNTER — Ambulatory Visit (INDEPENDENT_AMBULATORY_CARE_PROVIDER_SITE_OTHER): Payer: BC Managed Care – PPO | Admitting: Neurology

## 2015-03-31 ENCOUNTER — Encounter: Payer: Self-pay | Admitting: Neurology

## 2015-03-31 VITALS — BP 126/70 | HR 72 | Resp 20 | Ht 61.42 in | Wt 117.0 lb

## 2015-03-31 DIAGNOSIS — R42 Dizziness and giddiness: Secondary | ICD-10-CM

## 2015-03-31 DIAGNOSIS — H532 Diplopia: Secondary | ICD-10-CM | POA: Diagnosis not present

## 2015-03-31 DIAGNOSIS — I10 Essential (primary) hypertension: Secondary | ICD-10-CM | POA: Diagnosis not present

## 2015-03-31 NOTE — Progress Notes (Signed)
Chief Complaint  Patient presents with  . Diplopia    She is here with her husband, Kennith Center. Her vision is unchanged.  She was evaluated by Dr. Jolyn Nap at [facility] and prescribed glasses with prisms.      PATIENT: Angela Buchanan DOB: [birth year]  HISTORICAL  Angela Buchanan is 64 yo right-handed female, referred by her primary care physician Dr. Justin Mend, accompanied by her Husband for evaluation of intermittent horizontal double vision   She had a history of hypertension since 2013, had bilateral cataract surgery in June 2015 by ophthalmologist Dr. Talbert Forest, right eye in June first 2015, she did reported improved vision, 2 weeks later around March 15 2014, while riding as a passenger, she suddenly noticed horizontal double vision of right [place], she continue have intermittent horizontal double vision on extreme gaze to the right, or left, she had left cataract surgery in March 18 2014, which improved her monocular vision,   She only has double vision with both eyes open, always horizontal, she has one episode of transient vertigo in early December 2015, lasting for few minutes, she denies hearing loss, no tinnitus, no droopy eyelid, no bulbar weakness, no gait difficulty,   Laboratory in September 2015 showed normal CBC, CMP, TSH, vitamin D 26.7, mild elevated LDL 122 class Chol 228  Since 2010, she has intermittent horizontal double vision similar to what she has now, but much less frequent, less severe  UPDATE Dec 29th 2015: She continue have intermittent horizontal diplopia, especially when looking to far distance,  MRI of the brain reviewed with patient, mild atrophy, small vessel disease, no acute lesions  Laboratory evaluation showed no significant abnormality, negative acetylcholine receptor antibody  UPDATE March 31 2015: She was evaluated by Dr. Nobie Putnam in January 25 2015, she is now prisma, which has helped her eye relaxed.   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 The distance esotropia increases in lateral gazes. Ocular rotations show mildly limited elevation, depression, adduction and abduction of each eye. She gains a large area of binocular single vision with 6 base out OD. 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 Also in the differential would be bilateral sixth nerve palsies and myasthenia gravis. There was no variability or fatigue present on exam today.   She was placed on Fresnel prism 6 base out on the right lens of plano glasses to be used for distance. She will continue to read with over-the-counter readers without prism.   Lab showed elevated thyroid peroxidase antibody 272 with normal being less than 9. Previously TSH was normal in December 2015, negative acetylcholine receptor binding and bodily antibody. She has appointment with endocrinologist Dr. Delrae Rend in April 08 2015   REVIEW OF SYSTEMS: Full 14 system review of systems performed and notable only for as above   ALLERGIES: Allergies  Allergen Reactions  . Atenolol Hydrochloride     Gas, heartburn, headache  . Bisoprolol     Feels weird  . Prednisone     Feels weird     HOME MEDICATIONS: Current Outpatient Prescriptions on File Prior to Visit  Medication Sig Dispense Refill  . aspirin 81 MG tablet Take 81 mg by mouth daily.    Marland Kitchen b complex vitamins tablet Take 1 tablet by mouth daily.    . Calcium Citrate 250 MG TABS Take 250  mg by mouth 4 (four) times daily.    . Cholecalciferol (VITAMIN D-3) 5000 UNITS TABS Take 1 tablet by mouth daily.    Marland Kitchen estrogens, conjugated, (PREMARIN) 0.625 MG tablet Take 1 tablet (0.625 mg total) by mouth daily. Take daily for 21 days then do not take for 7 days. 21 tablet 11  . lisinopril (PRINIVIL,ZESTRIL) 20 MG tablet Take 1 tablet (20 mg total) by mouth daily. 90 tablet 1  . PAZEO 0.7 % SOLN Place  1 drop into the right eye as needed.     No current facility-administered medications on file prior to visit.    PAST MEDICAL HISTORY: Past Medical History  Diagnosis Date  . Diverticular disease   . H/O varicella   . H/O sinusitis 2011  . Hayfever 2011  . Left hip pain 2011  . H/O mumps   . Yeast vaginitis 2006  . Hypertension   . Family history of heart disease   . Chest pain     PAST SURGICAL HISTORY: Past Surgical History  Procedure Laterality Date  . Tonsillectomy    . Abdominal hysterectomy    . Cataract extraction Bilateral     FAMILY HISTORY: Family History  Problem Relation Age of Onset  . Hypertension Sister   . Dementia Father   . Stroke Sister     SOCIAL HISTORY:  History   Social History  . Marital Status: Single    Spouse Name: N/A  . Number of Children: 0  . Years of Education: college   Occupational History  . 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 Smokeless tobacco: Never Used  . Alcohol Use: No  . Drug Use: No  . Sexual Activity: No     Comment: hyst   Other Topics Concern  . Not on file   Social History Narrative   Patient lives at home with husband  ([name]).    Patient is rtired caoll   Right handed.   Caffeine 8oz . Caffeine daily     PHYSICAL EXAM   Filed Vitals:   03/31/15 1119  BP: 126/70  Pulse: 72  Resp: 20  Height: 5' 1.42" (1.56 m)  Weight: 117 lb (53.071 kg)    Not recorded      Body mass index is 21.81 kg/(m^2).  PHYSICAL EXAMNIATION:  Gen: NAD, conversant, well nourised, obese, well groomed                     Cardiovascular: Regular rate rhythm, no peripheral edema, warm, nontender. Eyes: Conjunctivae clear without exudates or hemorrhage Neck: Supple, no carotid bruise. Pulmonary: Clear to auscultation bilaterally   NEUROLOGICAL EXAM:  MENTAL STATUS: Speech:    Speech is normal; fluent and spontaneous with normal comprehension.    Cognition:    The patient is oriented to person, place, and time;     recent and remote memory intact;     language fluent;     normal attention, concentration,     fund of knowledge.  CRANIAL NERVES: CN II: Visual fields are full to confrontation. Pupil equal round reactive to light CN III, IV, VI: extraocular movement are full, No ptosis. Cover and uncover testing showed mild bilateral esophoria CN V: Facial sensation is intact to pinprick in all 3 divisions bilaterally. Corneal responses are intact.  CN VII: Face is symmetric with normal eye closure and smile. CN VIII: Hearing is normal to rubbing fingers  CN IX, X: Palate elevates symmetrically. Phonation is normal. CN XI: Head turning and shoulder shrug are intact CN XII: Tongue is midline with normal movements and no atrophy.  MOTOR: There is no pronator drift of out-stretched arms. Muscle bulk and tone are normal. Muscle strength is normal.  REFLEXES: Reflexes are 2+ and symmetric at the biceps, triceps, knees, and ankles. Plantar responses are flexor.  SENSORY: Light touch, pinprick, position sense, and vibration sense are intact in fingers and toes.  COORDINATION: Rapid alternating movements and fine finger movements are intact. There is no dysmetria on finger-to-nose and heel-knee-shin. There are no abnormal or extraneous movements.   GAIT/STANCE: Posture is normal. Gait is steady with normal steps, base, arm swing, and turning. Heel and toe walking are normal. Tandem gait is normal.  Romberg is absent.   DIAGNOSTIC DATA (LABS, IMAGING, TESTING) - I reviewed patient records, labs, notes, testing and imaging myself where available.  Lab Results  Component Value Date   WBC 4.9 10/20/2011   HGB 13.6 10/20/2011   HCT 39.6 10/20/2011   MCV 96.8 10/20/2011   PLT 402* 10/20/2011      Component Value Date/Time   NA 139 10/20/2011 2104   K 3.5 10/20/2011 2104   CL 103 10/20/2011 2104   CO2 25 10/20/2011 2104    GLUCOSE 98 10/20/2011 2104   BUN 9 10/20/2011 2104   CREATININE 0.70 10/20/2011 2104   CALCIUM 9.8 10/20/2011 2104   PROT 7.7 10/20/2011 2104   ALBUMIN 4.4 10/20/2011 2104   AST 25 10/20/2011 2104   ALT 15 10/20/2011 2104   ALKPHOS 63 10/20/2011 2104   BILITOT 0.3 10/20/2011 2104   GFRNONAA >90 10/20/2011 2104   GFRAA >90 10/20/2011 2104    ASSESSMENT AND PLAN  ELENER CUSTODIO is a 64 y.o. female complains of   intermittent binocular horizontal diplopia, especially far vision, consistent bilateral lateral rectus muscle weakness,   1. Her symptoms has much improved with Prisma treatment, 2, laboratory showed elevated thyroid peroxidase antibody, continue evaluation with endocrinologist 3, return to clinic in 6-9 months  Marcial Pacas, M.D. Ph.D.  [facility] [address] [place], [place] [address] [phone]

## 2015-04-08 ENCOUNTER — Other Ambulatory Visit: Payer: Self-pay | Admitting: Internal Medicine

## 2015-04-08 DIAGNOSIS — E063 Autoimmune thyroiditis: Secondary | ICD-10-CM

## 2015-04-08 DIAGNOSIS — R946 Abnormal results of thyroid function studies: Secondary | ICD-10-CM

## 2015-04-12 ENCOUNTER — Ambulatory Visit
Admission: RE | Admit: 2015-04-12 | Discharge: 2015-04-12 | Disposition: A | Discharge status: Home / Self Care | Payer: BC Managed Care – PPO | Source: Ambulatory Visit | Attending: Internal Medicine | Admitting: Internal Medicine

## 2015-04-12 DIAGNOSIS — E063 Autoimmune thyroiditis: Secondary | ICD-10-CM

## 2015-04-12 DIAGNOSIS — R946 Abnormal results of thyroid function studies: Secondary | ICD-10-CM

## 2015-04-16 ENCOUNTER — Other Ambulatory Visit: Payer: Self-pay | Admitting: Internal Medicine

## 2015-04-16 DIAGNOSIS — E041 Nontoxic single thyroid nodule: Secondary | ICD-10-CM

## 2015-05-05 ENCOUNTER — Other Ambulatory Visit: Payer: BC Managed Care – PPO

## 2015-05-06 ENCOUNTER — Other Ambulatory Visit: Payer: BC Managed Care – PPO

## 2015-05-19 ENCOUNTER — Ambulatory Visit
Admission: RE | Admit: 2015-05-19 | Discharge: 2015-05-19 | Disposition: A | Discharge status: Home / Self Care | Payer: BC Managed Care – PPO | Source: Ambulatory Visit | Attending: Internal Medicine | Admitting: Internal Medicine

## 2015-05-19 ENCOUNTER — Other Ambulatory Visit (HOSPITAL_COMMUNITY)
Admission: RE | Admit: 2015-05-19 | Discharge: 2015-05-19 | Disposition: A | Discharge status: Home / Self Care | Payer: BC Managed Care – PPO | Source: Ambulatory Visit | Attending: Interventional Radiology | Admitting: Interventional Radiology

## 2015-05-19 DIAGNOSIS — E041 Nontoxic single thyroid nodule: Secondary | ICD-10-CM

## 2015-06-24 ENCOUNTER — Other Ambulatory Visit: Payer: Self-pay | Admitting: Obstetrics and Gynecology

## 2015-06-24 ENCOUNTER — Ambulatory Visit
Admission: RE | Admit: 2015-06-24 | Discharge: 2015-06-24 | Disposition: A | Discharge status: Home / Self Care | Payer: Medicare Other | Source: Ambulatory Visit | Attending: Obstetrics and Gynecology | Admitting: Obstetrics and Gynecology

## 2015-06-24 DIAGNOSIS — R928 Other abnormal and inconclusive findings on diagnostic imaging of breast: Secondary | ICD-10-CM

## 2015-07-02 ENCOUNTER — Other Ambulatory Visit: Payer: Self-pay

## 2015-07-28 ENCOUNTER — Other Ambulatory Visit: Payer: Self-pay | Admitting: Internal Medicine

## 2015-07-28 DIAGNOSIS — E041 Nontoxic single thyroid nodule: Secondary | ICD-10-CM

## 2015-08-02 ENCOUNTER — Other Ambulatory Visit: Payer: Medicare Other

## 2015-11-30 ENCOUNTER — Other Ambulatory Visit: Payer: Self-pay | Admitting: Family Medicine

## 2015-11-30 DIAGNOSIS — R1032 Left lower quadrant pain: Secondary | ICD-10-CM

## 2015-12-01 ENCOUNTER — Ambulatory Visit
Admission: RE | Admit: 2015-12-01 | Discharge: 2015-12-01 | Disposition: A | Discharge status: Home / Self Care | Payer: Medicare Other | Source: Ambulatory Visit | Attending: Family Medicine | Admitting: Family Medicine

## 2015-12-01 DIAGNOSIS — R1032 Left lower quadrant pain: Secondary | ICD-10-CM

## 2015-12-01 MED ORDER — IOPAMIDOL (ISOVUE-300) INJECTION 61%
100.0000 mL | Freq: Once | INTRAVENOUS | Status: AC | PRN
  Administered 2015-12-01: 100 mL via INTRAVENOUS

## 2016-01-11 ENCOUNTER — Ambulatory Visit: Payer: BC Managed Care – PPO | Admitting: Neurology

## 2016-01-12 ENCOUNTER — Other Ambulatory Visit: Payer: Medicare Other

## 2016-01-13 ENCOUNTER — Ambulatory Visit
Admission: RE | Admit: 2016-01-13 | Discharge: 2016-01-13 | Disposition: A | Discharge status: Home / Self Care | Payer: Medicare Other | Source: Ambulatory Visit | Attending: Internal Medicine | Admitting: Internal Medicine

## 2016-01-13 DIAGNOSIS — E041 Nontoxic single thyroid nodule: Secondary | ICD-10-CM

## 2016-01-26 ENCOUNTER — Encounter: Payer: Self-pay | Admitting: Neurology

## 2016-01-26 ENCOUNTER — Ambulatory Visit (INDEPENDENT_AMBULATORY_CARE_PROVIDER_SITE_OTHER): Payer: Medicare Other | Admitting: Neurology

## 2016-01-26 VITALS — BP 143/80 | HR 67 | Ht 61.5 in | Wt 110.0 lb

## 2016-01-26 DIAGNOSIS — H532 Diplopia: Secondary | ICD-10-CM

## 2016-01-26 NOTE — Progress Notes (Signed)
Chief Complaint  Patient presents with  . Diplopia    She is here with her husband, Kennith Center. She has had no further problems with dizziness or double vision since getting her glasses with prism correction.      PATIENT: Angela Buchanan DOB: 05-13-1951  HISTORICAL  Angela Buchanan is 65 yo right-handed female, referred by her primary care physician Dr. Justin Mend, accompanied by her Husband for evaluation of intermittent horizontal double vision   She had a history of hypertension since 2013, had bilateral cataract surgery in June 2015 by ophthalmologist Dr. Talbert Forest, right eye in June first 2015, she did reported improved vision, 2 weeks later around March 15 2014, while riding as a passenger, she suddenly noticed horizontal double vision of right [place], she continue have intermittent horizontal double vision on extreme gaze to the right, or left, she had left cataract surgery in March 18 2014, which improved her monocular vision,   She only has double vision with both eyes open, always horizontal, she has one episode of transient vertigo in early December 2015, lasting for few minutes, she denies hearing loss, no tinnitus, no droopy eyelid, no bulbar weakness, no gait difficulty,   Laboratory in September 2015 showed normal CBC, CMP, TSH, vitamin D 26.7, mild elevated LDL 122 class Chol 228  Since 2010, she has intermittent horizontal double vision similar to what she has now, but much less frequent, less severe  UPDATE Dec 29th 2015: She continue have intermittent horizontal diplopia, especially when looking to far distance,  MRI of the brain reviewed with patient, mild atrophy, small vessel disease, no acute lesions  Laboratory evaluation showed no significant abnormality, negative acetylcholine receptor antibody  UPDATE March 31 2015: She was evaluated by Dr. Nobie Putnam in January 25 2015, she is now on prisma, which has helped her eye relaxed.   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 The distance esotropia increases in lateral gazes. Ocular rotations show mildly limited elevation, depression, adduction and abduction of each eye. She gains a large area of binocular single vision with 6 base out OD. 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 Also in the differential would be bilateral sixth nerve palsies and myasthenia gravis. There was no variability or fatigue present on exam today.   She was placed on Fresnel prism 6 base out on the right lens of plano glasses to be used for distance. She will continue to read with over-the-counter readers without prism.   Lab showed elevated thyroid peroxidase antibody 272 with normal being less than 9. Previously TSH was normal in December 2015, negative acetylcholine receptor binding and bodily antibody. She has appointment with endocrinologist Dr. Delrae Rend in April 08 2015  UPDATE April 26th 2017: She now has new glasses with prisma, vision has much improved, she no longer has double vision with her glasses on, she was evaluated by endocrinologist, never has to take any medication for her thyroid, she has elevated thyroid peroxidase antibody  REVIEW OF SYSTEMS: Full 14 system review of systems performed and notable only for as above   ALLERGIES: Allergies  Allergen Reactions  . Atenolol Hydrochloride     Gas, heartburn, headache  . Bisoprolol     Feels weird  . Prednisone     Feels weird     HOME MEDICATIONS: Current Outpatient Prescriptions on File  Prior to Visit  Medication Sig Dispense Refill  . aspirin 81 MG tablet Take 81 mg by mouth daily.    Marland Kitchen b complex vitamins tablet Take 1 tablet by mouth daily.    . Calcium Citrate 250 MG TABS Take 250 mg by mouth 4 (four) times daily.    . Cholecalciferol (VITAMIN D-3) 5000 UNITS TABS Take 1 tablet by mouth daily.    . citalopram  (CELEXA) 10 MG tablet     . estrogens, conjugated, (PREMARIN) 0.625 MG tablet Take 1 tablet (0.625 mg total) by mouth daily. Take daily for 21 days then do not take for 7 days. 21 tablet 11  . fluticasone (FLONASE) 50 MCG/ACT nasal spray     . lisinopril (PRINIVIL,ZESTRIL) 20 MG tablet Take 1 tablet (20 mg total) by mouth daily. 90 tablet 1  . montelukast (SINGULAIR) 10 MG tablet     . PAZEO 0.7 % SOLN Place 1 drop into the right eye as needed.     No current facility-administered medications on file prior to visit.    PAST MEDICAL HISTORY: Past Medical History  Diagnosis Date  . Diverticular disease   . H/O varicella   . H/O sinusitis 2011  . Hayfever 2011  . Left hip pain 2011  . H/O mumps   . Yeast vaginitis 2006  . Hypertension   . Family history of heart disease   . Chest pain     PAST SURGICAL HISTORY: Past Surgical History  Procedure Laterality Date  . Tonsillectomy    . Abdominal hysterectomy    . Cataract extraction Bilateral     FAMILY HISTORY: Family History  Problem Relation Age of Onset  . Hypertension Sister   . Dementia Father   . Stroke Sister     SOCIAL HISTORY:  Social History   Social History  . Marital Status: Married    Spouse Name: N/A  . Number of Children: 0  . Years of Education: college   Occupational History  . Retried Teacher [employer] History Main Topics  . Smoking status: Never Smoker   . Smokeless tobacco: Never Used  . Alcohol Use: No  . Drug Use: No  . Sexual Activity: No     Comment: hyst   Other Topics Concern  . Not on file   Social History Narrative   Patient lives at home with husband  ([name]).    Patient is rtired caoll   Right handed.   Caffeine 8oz . Caffeine daily     PHYSICAL EXAM   Filed Vitals:   01/26/16 1403  BP: 143/80  Pulse: 67  Height: 5' 1.5" (1.562 m)  Weight: 110 lb (49.896 kg)    Not recorded      Body mass index is 20.45 kg/(m^2).  PHYSICAL EXAMNIATION:  Gen:  NAD, conversant, well nourised, obese, well groomed                     Cardiovascular: Regular rate rhythm, no peripheral edema, warm, nontender. Eyes: Conjunctivae clear without exudates or hemorrhage Neck: Supple, no carotid bruise. Pulmonary: Clear to auscultation bilaterally   NEUROLOGICAL EXAM:  MENTAL STATUS: Speech:    Speech is normal; fluent and spontaneous with normal comprehension.  Cognition:    The patient is oriented to person, place, and time;     recent and remote memory intact;     language fluent;     normal attention, concentration,     fund of  knowledge.  CRANIAL NERVES: CN II: Visual fields are full to confrontation. Pupil equal round reactive to light CN III, IV, VI: extraocular movement are full, No ptosis. Cover and uncover testing showed mild bilateral esophoria CN V: Facial sensation is intact to pinprick in all 3 divisions bilaterally. Corneal responses are intact.  CN VII: Face is symmetric with normal eye closure and smile. CN VIII: Hearing is normal to rubbing fingers CN IX, X: Palate elevates symmetrically. Phonation is normal. CN XI: Head turning and shoulder shrug are intact CN XII: Tongue is midline with normal movements and no atrophy.  MOTOR: There is no pronator drift of out-stretched arms. Muscle bulk and tone are normal. Muscle strength is normal.  REFLEXES: Reflexes are 2+ and symmetric at the biceps, triceps, knees, and ankles. Plantar responses are flexor.  SENSORY: Light touch, pinprick, position sense, and vibration sense are intact in fingers and toes.  COORDINATION: Rapid alternating movements and fine finger movements are intact. There is no dysmetria on finger-to-nose and heel-knee-shin. There are no abnormal or extraneous movements.   GAIT/STANCE: Posture is normal. Gait is steady with normal steps, base, arm swing, and turning. Heel and toe walking are normal. Tandem gait is normal.  Romberg is absent.   DIAGNOSTIC DATA  (LABS, IMAGING, TESTING) - I reviewed patient records, labs, notes, testing and imaging myself where available.  Lab Results  Component Value Date   WBC 4.9 10/20/2011   HGB 13.6 10/20/2011   HCT 39.6 10/20/2011   MCV 96.8 10/20/2011   PLT 402* 10/20/2011      Component Value Date/Time   NA 139 10/20/2011 2104   K 3.5 10/20/2011 2104   CL 103 10/20/2011 2104   CO2 25 10/20/2011 2104   GLUCOSE 98 10/20/2011 2104   BUN 9 10/20/2011 2104   CREATININE 0.70 10/20/2011 2104   CALCIUM 9.8 10/20/2011 2104   PROT 7.7 10/20/2011 2104   ALBUMIN 4.4 10/20/2011 2104   AST 25 10/20/2011 2104   ALT 15 10/20/2011 2104   ALKPHOS 63 10/20/2011 2104   BILITOT 0.3 10/20/2011 2104   GFRNONAA >90 10/20/2011 2104   GFRAA >90 10/20/2011 2104    ASSESSMENT AND PLAN  Angela Buchanan is a 65 y.o. female complains of   intermittent binocular horizontal diplopia, especially far vision, consistent bilateral lateral rectus muscle weakness,   1. Her symptoms has much improved with Prisma treatment, 2, laboratory showed elevated thyroid peroxidase antibody, 3. Continue daily aspirin for stroke prevention,  Marcial Pacas, M.D. Ph.D.  [facility] [address], [place], [place] [address] [phone]

## 2016-08-08 ENCOUNTER — Encounter: Payer: Self-pay | Admitting: Interventional Cardiology

## 2016-08-08 ENCOUNTER — Ambulatory Visit (INDEPENDENT_AMBULATORY_CARE_PROVIDER_SITE_OTHER): Payer: Medicare Other | Admitting: Interventional Cardiology

## 2016-08-08 ENCOUNTER — Encounter (INDEPENDENT_AMBULATORY_CARE_PROVIDER_SITE_OTHER): Payer: Self-pay

## 2016-08-08 VITALS — BP 134/80 | HR 58 | Ht 62.0 in | Wt 110.6 lb

## 2016-08-08 DIAGNOSIS — R002 Palpitations: Secondary | ICD-10-CM | POA: Diagnosis not present

## 2016-08-08 DIAGNOSIS — I10 Essential (primary) hypertension: Secondary | ICD-10-CM | POA: Diagnosis not present

## 2016-08-08 DIAGNOSIS — R0782 Intercostal pain: Secondary | ICD-10-CM | POA: Diagnosis not present

## 2016-08-08 NOTE — Patient Instructions (Signed)
**Note De-Identified [place] Rommel Obfuscation** Medication Instructions:  Same-no changes  Labwork: None  Testing/Procedures: Your physician has recommended that you wear a 30 day event monitor. Event monitors are medical devices that record the heart's electrical activity. Doctors most often [place] these monitors to diagnose arrhythmias. Arrhythmias are problems with the speed or rhythm of the heartbeat. The monitor is a small, portable device. You can wear one while you do your normal daily activities. This is usually used to diagnose what is causing palpitations/syncope (passing out).    Follow-Up: Based on results.     If you need a refill on your cardiac medications before your next appointment, please call your pharmacy.

## 2016-08-08 NOTE — Progress Notes (Signed)
Cardiology Office Note   Date:  08/08/2016   ID:  Angela Buchanan, DOB [birth year], MRN [id number]  PCP:  Jonathon Bellows, MD    No chief complaint on file. chest pain, palpitations   Wt Readings from Last 3 Encounters:  08/08/16 50.2 kg (110 lb 9.6 oz)  01/26/16 49.9 kg (110 lb)  03/31/15 53.1 kg (117 lb)       History of Present Illness: Angela Buchanan is a 65 y.o. female  Who has had some sharp chest pains.  It has occurred a few times per week over the last few weeks.  The pain is not related to exertion.  The pain lasts a few minutes at most.  At other times, it is only a few seconds in duration.   3-4 x/week, she has noted some rapid heart beats.  It happens with sitting or lying down, which can last up to a few minutes.   She had a cardiac w/u several years ago showing some mild mitral regurgitation.      Past Medical History:  Diagnosis Date  . Chest pain   . Diverticular disease   . Family history of heart disease   . H/O mumps   . H/O sinusitis 2011  . H/O varicella   . Hayfever 2011  . Hypertension   . Left hip pain 2011  . Yeast vaginitis 2006    Past Surgical History:  Procedure Laterality Date  . ABDOMINAL HYSTERECTOMY    . CATARACT EXTRACTION Bilateral   . TONSILLECTOMY       Current Outpatient Prescriptions  Medication Sig Dispense Refill  . aspirin 81 MG tablet Take 81 mg by mouth daily.    Marland Kitchen b complex vitamins tablet Take 1 tablet by mouth daily.    . Calcium Citrate 250 MG TABS Take 250 mg by mouth 4 (four) times daily.    . citalopram (CELEXA) 10 MG tablet Take 10 mg by mouth daily as needed (anxiety).     . fluticasone (FLONASE) 50 MCG/ACT nasal spray     . lisinopril (PRINIVIL,ZESTRIL) 20 MG tablet Take 1 tablet (20 mg total) by mouth daily. 90 tablet 1  . montelukast (SINGULAIR) 10 MG tablet Take 10 mg by mouth at bedtime.     Marland Kitchen PAZEO 0.7 % SOLN Place 1 drop into the right eye as needed.     No current facility-administered  medications for this visit.     Allergies:   Atenolol hydrochloride; Bisoprolol; and Prednisone    Social History:  The patient  reports that she has never smoked. She has never used smokeless tobacco. She reports that she does not drink alcohol or use drugs.   Family History:  The patient's family history includes Dementia in her father; Hypertension in her sister; Stroke in her sister.    ROS:  Please see the history of present illness.   Otherwise, review of systems are positive for palpitations.   All other systems are reviewed and negative.    PHYSICAL EXAM: VS:  BP 134/80   Pulse (!) 58   Ht 5\' 2"  (1.575 m)   Wt 50.2 kg (110 lb 9.6 oz)   LMP  (LMP Unknown)   BMI 20.23 kg/m  , BMI Body mass index is 20.23 kg/m. GEN: Well nourished, well developed, in no acute distress  HEENT: normal  Neck: no JVD, carotid bruits, or masses Cardiac: RRR; no murmurs, rubs, or gallops,no edema  Respiratory:  clear to auscultation  bilaterally, normal work of breathing GI: soft, nontender, nondistended, + BS MS: no deformity or atrophy  Skin: warm and dry, no rash Neuro:  Strength and sensation are intact Psych: euthymic mood, full affect   EKG:   The ekg ordered today demonstrates SB, no ST segment changes   Recent Labs: No results found for requested labs within last 8760 hours.   Lipid Panel No results found for: CHOL, TRIG, HDL, CHOLHDL, VLDL, LDLCALC, LDLDIRECT   Other studies Reviewed: Additional studies/ records that were reviewed today with results demonstrating: 2014 echo reviewed: normal LV function.   ASSESSMENT AND PLAN:  1. Palpitatons:  Plan for event montior.  No syncope.  Overall low risk symptoms.  EF normal in the past. 2. Chest pain: Atypical.  Negative nuclear stress test a few years ago.  No further w/u at this time.  3. HTN: COntinue lisinopril.  BP controlled.    Current medicines are reviewed at length with the patient today.  The patient concerns  regarding her medicines were addressed.  The following changes have been made:  No change  Labs/ tests ordered today include:  No orders of the defined types were placed in this encounter.   Recommend 150 minutes/week of aerobic exercise Low fat, low carb, high fiber diet recommended  Disposition:   FU in based on monitor results   Signed, Larae Grooms, MD  08/08/2016 11:59 AM    [address], [place], [place]  [address] Phone: [phone]; Fax: [phone]

## 2016-08-17 ENCOUNTER — Ambulatory Visit (INDEPENDENT_AMBULATORY_CARE_PROVIDER_SITE_OTHER): Payer: Medicare Other

## 2016-08-17 DIAGNOSIS — R002 Palpitations: Secondary | ICD-10-CM | POA: Diagnosis not present

## 2016-09-04 ENCOUNTER — Encounter: Payer: Self-pay | Admitting: *Deleted

## 2016-09-04 NOTE — Progress Notes (Signed)
Patient ID: Angela Buchanan, female   DOB: [birth year], 65 y.o.   MRN: [id number]  Patient brought in [employer] verite monitor and 2 chargers. She states monitor would not charge.  [employer] sent her another Games developer.  Monitor would still not charge.  Serial number of monitor brought in was [id number].  Mitch Danied from [employer] called. Gave patient a new monitor serial number [id number].  He will contact [employer] with new information.  Karn Pickler will pick up malfunctioning monitor from our office.

## 2016-09-21 ENCOUNTER — Telehealth: Payer: Self-pay | Admitting: *Deleted

## 2016-09-21 NOTE — Telephone Encounter (Signed)
Patient informed. 

## 2016-09-21 NOTE — Telephone Encounter (Signed)
-----   Message from Jettie Booze, MD sent at 09/21/2016  4:14 PM EST ----- No AFib. No other dangerous heart rhythm noted. Just some skipped beats as noted above.

## 2016-09-21 NOTE — Telephone Encounter (Signed)
Follow up  Pt voiced wanting nurse to contact her back.

## 2017-04-23 ENCOUNTER — Other Ambulatory Visit: Payer: Self-pay | Admitting: Family Medicine

## 2017-04-23 DIAGNOSIS — Z1231 Encounter for screening mammogram for malignant neoplasm of breast: Secondary | ICD-10-CM

## 2017-04-24 ENCOUNTER — Ambulatory Visit
Admission: RE | Admit: 2017-04-24 | Discharge: 2017-04-24 | Disposition: A | Discharge status: Home / Self Care | Payer: Medicare Other | Source: Ambulatory Visit | Attending: Family Medicine | Admitting: Family Medicine

## 2017-04-24 DIAGNOSIS — Z1231 Encounter for screening mammogram for malignant neoplasm of breast: Secondary | ICD-10-CM

## 2017-04-25 ENCOUNTER — Other Ambulatory Visit: Payer: Self-pay | Admitting: Family Medicine

## 2017-04-25 DIAGNOSIS — R928 Other abnormal and inconclusive findings on diagnostic imaging of breast: Secondary | ICD-10-CM

## 2017-05-01 ENCOUNTER — Ambulatory Visit
Admission: RE | Admit: 2017-05-01 | Discharge: 2017-05-01 | Disposition: A | Discharge status: Home / Self Care | Payer: Medicare Other | Source: Ambulatory Visit | Attending: Family Medicine | Admitting: Family Medicine

## 2017-05-01 ENCOUNTER — Other Ambulatory Visit: Payer: Self-pay | Admitting: Family Medicine

## 2017-05-01 DIAGNOSIS — R928 Other abnormal and inconclusive findings on diagnostic imaging of breast: Secondary | ICD-10-CM

## 2017-05-01 DIAGNOSIS — N6310 Unspecified lump in the right breast, unspecified quadrant: Secondary | ICD-10-CM

## 2017-05-01 DIAGNOSIS — N6489 Other specified disorders of breast: Secondary | ICD-10-CM

## 2017-05-04 ENCOUNTER — Other Ambulatory Visit: Payer: Medicare Other

## 2017-05-16 ENCOUNTER — Ambulatory Visit
Admission: RE | Admit: 2017-05-16 | Discharge: 2017-05-16 | Disposition: A | Discharge status: Home / Self Care | Payer: Medicare Other | Source: Ambulatory Visit | Attending: Family Medicine | Admitting: Family Medicine

## 2017-05-16 DIAGNOSIS — N6489 Other specified disorders of breast: Secondary | ICD-10-CM

## 2017-05-16 DIAGNOSIS — N6310 Unspecified lump in the right breast, unspecified quadrant: Secondary | ICD-10-CM

## 2017-05-18 ENCOUNTER — Telehealth: Payer: Self-pay | Admitting: *Deleted

## 2017-05-18 NOTE — Telephone Encounter (Signed)
Left message for a return phone call to schedule for [facility] 8/22.

## 2017-05-21 ENCOUNTER — Telehealth: Payer: Self-pay | Admitting: *Deleted

## 2017-05-21 NOTE — Telephone Encounter (Signed)
Confirmed [facility] for 05/23/17 at 1215pm. .  Instructions and contact information given.

## 2017-05-22 ENCOUNTER — Encounter: Payer: Self-pay | Admitting: General Surgery

## 2017-05-22 ENCOUNTER — Other Ambulatory Visit: Payer: Self-pay | Admitting: *Deleted

## 2017-05-22 DIAGNOSIS — Z170 Estrogen receptor positive status [ER+]: Secondary | ICD-10-CM | POA: Insufficient documentation

## 2017-05-22 DIAGNOSIS — C50411 Malignant neoplasm of upper-outer quadrant of right female breast: Secondary | ICD-10-CM

## 2017-05-23 ENCOUNTER — Encounter: Payer: Self-pay | Admitting: Physical Therapy

## 2017-05-23 ENCOUNTER — Ambulatory Visit (HOSPITAL_BASED_OUTPATIENT_CLINIC_OR_DEPARTMENT_OTHER): Payer: Medicare Other | Admitting: Hematology and Oncology

## 2017-05-23 ENCOUNTER — Other Ambulatory Visit (HOSPITAL_BASED_OUTPATIENT_CLINIC_OR_DEPARTMENT_OTHER): Payer: Medicare Other

## 2017-05-23 ENCOUNTER — Encounter: Payer: Self-pay | Admitting: Hematology and Oncology

## 2017-05-23 ENCOUNTER — Other Ambulatory Visit: Payer: Self-pay | Admitting: *Deleted

## 2017-05-23 ENCOUNTER — Ambulatory Visit
Admission: RE | Admit: 2017-05-23 | Discharge: 2017-05-23 | Disposition: A | Discharge status: Home / Self Care | Payer: Medicare Other | Source: Ambulatory Visit | Attending: Radiation Oncology | Admitting: Radiation Oncology

## 2017-05-23 ENCOUNTER — Ambulatory Visit: Payer: Medicare Other | Attending: General Surgery | Admitting: Physical Therapy

## 2017-05-23 ENCOUNTER — Other Ambulatory Visit: Payer: Self-pay | Admitting: General Surgery

## 2017-05-23 DIAGNOSIS — C50411 Malignant neoplasm of upper-outer quadrant of right female breast: Secondary | ICD-10-CM | POA: Insufficient documentation

## 2017-05-23 DIAGNOSIS — Z170 Estrogen receptor positive status [ER+]: Secondary | ICD-10-CM

## 2017-05-23 DIAGNOSIS — Z79899 Other long term (current) drug therapy: Secondary | ICD-10-CM | POA: Insufficient documentation

## 2017-05-23 DIAGNOSIS — Z888 Allergy status to other drugs, medicaments and biological substances status: Secondary | ICD-10-CM | POA: Insufficient documentation

## 2017-05-23 DIAGNOSIS — I10 Essential (primary) hypertension: Secondary | ICD-10-CM | POA: Insufficient documentation

## 2017-05-23 DIAGNOSIS — Z90710 Acquired absence of both cervix and uterus: Secondary | ICD-10-CM | POA: Insufficient documentation

## 2017-05-23 DIAGNOSIS — Z823 Family history of stroke: Secondary | ICD-10-CM | POA: Insufficient documentation

## 2017-05-23 DIAGNOSIS — N6489 Other specified disorders of breast: Secondary | ICD-10-CM | POA: Insufficient documentation

## 2017-05-23 DIAGNOSIS — Z9842 Cataract extraction status, left eye: Secondary | ICD-10-CM | POA: Insufficient documentation

## 2017-05-23 DIAGNOSIS — Z510 Encounter for antineoplastic radiation therapy: Secondary | ICD-10-CM | POA: Insufficient documentation

## 2017-05-23 DIAGNOSIS — Z7982 Long term (current) use of aspirin: Secondary | ICD-10-CM | POA: Insufficient documentation

## 2017-05-23 DIAGNOSIS — Z7951 Long term (current) use of inhaled steroids: Secondary | ICD-10-CM | POA: Insufficient documentation

## 2017-05-23 DIAGNOSIS — Z8249 Family history of ischemic heart disease and other diseases of the circulatory system: Secondary | ICD-10-CM | POA: Insufficient documentation

## 2017-05-23 DIAGNOSIS — R293 Abnormal posture: Secondary | ICD-10-CM

## 2017-05-23 DIAGNOSIS — Z961 Presence of intraocular lens: Secondary | ICD-10-CM | POA: Insufficient documentation

## 2017-05-23 DIAGNOSIS — R221 Localized swelling, mass and lump, neck: Secondary | ICD-10-CM

## 2017-05-23 DIAGNOSIS — Z9841 Cataract extraction status, right eye: Secondary | ICD-10-CM | POA: Insufficient documentation

## 2017-05-23 LAB — CBC WITH DIFFERENTIAL/PLATELET
BASO%: 1.4 % (ref 0.0–2.0)
Basophils Absolute: 0.1 10*3/uL (ref 0.0–0.1)
EOS%: 4.2 % (ref 0.0–7.0)
Eosinophils Absolute: 0.2 10*3/uL (ref 0.0–0.5)
HEMATOCRIT: 36.6 % (ref 34.8–46.6)
HEMOGLOBIN: 12.5 g/dL (ref 11.6–15.9)
LYMPH#: 1.7 10*3/uL (ref 0.9–3.3)
LYMPH%: 32.6 % (ref 14.0–49.7)
MCH: 33.4 pg (ref 25.1–34.0)
MCHC: 34.3 g/dL (ref 31.5–36.0)
MCV: 97.5 fL (ref 79.5–101.0)
MONO#: 0.5 10*3/uL (ref 0.1–0.9)
MONO%: 10.2 % (ref 0.0–14.0)
NEUT#: 2.7 10*3/uL (ref 1.5–6.5)
NEUT%: 51.6 % (ref 38.4–76.8)
Platelets: 433 10*3/uL — ABNORMAL HIGH (ref 145–400)
RBC: 3.75 10*6/uL (ref 3.70–5.45)
RDW: 12.9 % (ref 11.2–14.5)
WBC: 5.1 10*3/uL (ref 3.9–10.3)

## 2017-05-23 LAB — COMPREHENSIVE METABOLIC PANEL
ALT: 12 U/L (ref 0–55)
AST: 20 U/L (ref 5–34)
Albumin: 3.8 g/dL (ref 3.5–5.0)
Alkaline Phosphatase: 99 U/L (ref 40–150)
Anion Gap: 6 mEq/L (ref 3–11)
BUN: 11.9 mg/dL (ref 7.0–26.0)
CALCIUM: 9.5 mg/dL (ref 8.4–10.4)
CHLORIDE: 108 meq/L (ref 98–109)
CO2: 25 mEq/L (ref 22–29)
Creatinine: 0.8 mg/dL (ref 0.6–1.1)
EGFR: 73 mL/min/{1.73_m2} — ABNORMAL LOW (ref >90)
GLUCOSE: 101 mg/dL (ref 70–140)
Potassium: 3.8 mEq/L (ref 3.5–5.1)
Sodium: 138 mEq/L (ref 136–145)
Total Bilirubin: 0.5 mg/dL (ref 0.20–1.20)
Total Protein: 7.2 g/dL (ref 6.4–8.3)

## 2017-05-23 NOTE — Therapy (Signed)
[place], [place], [address] Phone: [phone]   Fax:  [phone]  Physical Therapy Evaluation  Patient Details  Name: CAELI LINEHAN MRN: [id number] Date of Birth: [birth year] Referring Provider: Dr. Fanny Skates  Encounter Date: 05/23/2017      PT End of Session - 05/23/17 1536    Visit Number 1   Number of Visits 1   PT Start Time 1422   PT Stop Time 1445   PT Time Calculation (min) 23 min   Activity Tolerance Patient tolerated treatment well   Behavior During Therapy [facility] for tasks assessed/performed      Past Medical History:  Diagnosis Date  . Bigeminy   . Chest pain   . Diverticular disease   . Family history of heart disease   . H/O mumps   . H/O sinusitis 2011  . H/O varicella   . Hayfever 2011  . Hypertension   . Left hip pain 2011  . Yeast vaginitis 2006    Past Surgical History:  Procedure Laterality Date  . ABDOMINAL HYSTERECTOMY    . CATARACT EXTRACTION Bilateral   . TONSILLECTOMY      There were no vitals filed for this visit.       Subjective Assessment - 05/23/17 1525    Subjective Patient reports she is here today to be seen by her medical team for her newly diagnosed right breast cancer. She also has an area in her left breast that is a complex sclerosing lesion.   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 She also has a complex sclerosing lesion in the upper inner quadrant of her left breast. The mass in her right breast measures 1.1 cm and is located in the upper outer quadrant. It is ER/PR positive and HER2 negative with a low Ki67.   Patient Stated Goals Reduce lymphedema risk and learn post op shoulder ROM HEP   Currently in Pain? No/denies            [facility] PT Assessment - 05/23/17 0001      Assessment   Medical Diagnosis Right  breast cancer   Referring Provider Dr. Fanny Skates   Onset Date/Surgical Date 04/24/17   Hand Dominance Right   Prior Therapy none     Precautions   Precautions Other (comment)   Precaution Comments active cancer     Restrictions   Weight Bearing Restrictions No     Balance Screen   Has the patient fallen in the past 6 months No   Has the patient had a decrease in activity level because of a fear of falling?  No   Is the patient reluctant to leave their home because of a fear of falling?  No     Home Ecologist residence   Living Arrangements Spouse/significant other   Available Help at Discharge Family     Prior Function   Level of [place] Retired   Biomedical scientist retired Pharmacist, hospital   Leisure She does Pilates 1x/week     Cognition   Overall Cognitive Status Within Functional Limits for tasks assessed     Posture/Postural Control   Posture/Postural Control Postural limitations   Postural Limitations Rounded Shoulders;Forward head;Increased thoracic kyphosis     ROM / Strength   AROM / PROM / Strength AROM;Strength  AROM   AROM Assessment Site Shoulder;Cervical   Right/Left Shoulder Right;Left   Right Shoulder Extension 61 Degrees   Right Shoulder Flexion 141 Degrees   Right Shoulder ABduction 147 Degrees   Right Shoulder Internal Rotation 65 Degrees   Right Shoulder External Rotation 80 Degrees   Left Shoulder Extension 55 Degrees   Left Shoulder Flexion 128 Degrees   Left Shoulder ABduction 145 Degrees   Left Shoulder Internal Rotation 73 Degrees   Left Shoulder External Rotation 70 Degrees   Cervical Flexion WNL   Cervical Extension WNL   Cervical - Right Side Bend WNL   Cervical - Left Side Bend WNL   Cervical - Right Rotation WNL   Cervical - Left Rotation WNL     Strength   Overall Strength Within functional limits for tasks performed           LYMPHEDEMA/ONCOLOGY QUESTIONNAIRE  - 05/23/17 1533      Type   Cancer Type Right breast cancer     Lymphedema Assessments   Lymphedema Assessments Upper extremities     Right Upper Extremity Lymphedema   10 cm Proximal to Olecranon Process 23 cm   Olecranon Process 21.4 cm   10 cm Proximal to Ulnar Styloid Process 18.7 cm   Just Proximal to Ulnar Styloid Process 13.5 cm   Across Hand at [employer] 15.9 cm   At [place] of 2nd Digit 5 cm     Left Upper Extremity Lymphedema   10 cm Proximal to Olecranon Process 23 cm   Olecranon Process 21.5 cm   10 cm Proximal to Ulnar Styloid Process 19 cm   Just Proximal to Ulnar Styloid Process 13.3 cm   Across Hand at [employer] 16 cm   At [place] of 2nd Digit 4.9 cm         Objective measurements completed on examination: See above findings.       Patient was instructed today in a home exercise program today for post op shoulder range of motion. These included active assist shoulder flexion in sitting, scapular retraction, wall walking with shoulder abduction, and hands behind head external rotation.  She was encouraged to do these twice a day, holding 3 seconds and repeating 5 times when permitted by her physician.               PT Education - 05/23/17 1536    Education provided Yes   Education Details Lymphedema risk reduction and post op shoulder ROM HEP   Person(s) Educated Patient;Spouse   Methods Explanation;Demonstration;Handout   Comprehension Returned demonstration;Verbalized understanding              Breast Clinic Goals - 05/23/17 1543      Patient will be able to verbalize understanding of pertinent lymphedema risk reduction practices relevant to her diagnosis specifically related to skin care.   Time 1   Period Days   Status Achieved     Patient will be able to return demonstrate and/or verbalize understanding of the post-op home exercise program related to regaining shoulder range of motion.   Time 1   Period Days   Status  Achieved     Patient will be able to verbalize understanding of the importance of attending the postoperative After Breast Cancer Class for further lymphedema risk reduction education and therapeutic exercise.   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 She also has a complex sclerosing lesion in the upper inner quadrant of her left breast. The mass in her right breast measures 1.1 cm and is located in the upper outer quadrant. It is ER/PR positive and HER2 negative with a low Ki67. Her multidisciplinary medical team met prior to her assessments to determine a recommended treatment plan. She is planning to have a right lumpectomy and sentinel node biopsy followed by Oncotype testing, radiation, and anti-estrogen therapy. She will benefit from post op PT to regain shoulder ROM and reduce lymphedema risk.   History and Personal Factors relevant to plan of care: Recently diagnosed with cancer; has osteoporosis and hypertension   Clinical Presentation Evolving   Clinical Presentation due to: Cancer newly diagnosed; awaiting surgery to determine from final pathology if she will need chemotherapy.   Clinical Decision Making Moderate   Rehab Potential Excellent   Clinical Impairments Affecting Rehab Potential None   PT Frequency One time visit   PT Treatment/Interventions Patient/family education;Therapeutic exercise   PT Next Visit Plan Will f/u after surgery to determine PT needs   PT Home Exercise Plan Post op shoulder ROM HEP   Consulted and Agree with Plan of Care Patient;Family member/caregiver   Family Member Consulted Husband      Patient will benefit from skilled therapeutic intervention in order to improve the following deficits and impairments:  Postural dysfunction, Decreased knowledge of precautions, Pain,  Impaired UE functional use, Decreased range of motion  Visit Diagnosis: Carcinoma of upper-outer quadrant of right breast in female, estrogen receptor positive ([place]) - Plan: PT plan of care cert/re-cert  Abnormal posture - Plan: PT plan of care cert/re-cert      G-Codes - 29/51/88 1543    Functional Assessment Tool Used (Outpatient Only) clinical judgement   Functional Limitation Other PT primary   Other PT Primary Current Status (C1660) At least 1 percent but less than 20 percent impaired, limited or restricted   Other PT Primary Goal Status (Y3016) At least 1 percent but less than 20 percent impaired, limited or restricted   Other PT Primary Discharge Status (W1093) At least 1 percent but less than 20 percent impaired, limited or restricted     Patient will follow up at outpatient cancer rehab if needed following surgery.  If the patient requires physical therapy at that time, a specific plan will be dictated and sent to the referring physician for approval. The patient was educated today on appropriate basic range of motion exercises to begin post operatively and the importance of attending the After Breast Cancer class following surgery.  Patient was educated today on lymphedema risk reduction practices as it pertains to recommendations that will benefit the patient immediately following surgery.  She verbalized good understanding.  No additional physical therapy is indicated at this time.     Problem List Patient Active Problem List   Diagnosis Date Noted  . Malignant neoplasm of upper-outer quadrant of right breast in female, estrogen receptor positive ([place]) 05/22/2017  . Diplopia 09/17/2014  . Dizziness and giddiness 09/17/2014  . Essential hypertension 07/22/2014  . Chest pain 07/22/2014  . Family history of heart disease 07/22/2014    Annia Friendly, PT 05/23/17 3:45 PM  [facility] [place],  [place], [address] Phone: [phone]   Fax:  [phone]  Name: SHAELA BOER MRN: [id number] Date of Birth: [birth year]

## 2017-05-23 NOTE — Assessment & Plan Note (Addendum)
05/16/2017: Left breast biopsy UIQ: Complex sclerosing lesion with calcifications and sclerosing adenosis, right breast biopsy 10:00: IDC grade 2, DCIS, ER/PR positive, HER-2 negative ratio 1.26,  11 mm lesion in the right breast at 10:00 position: T1b N0 stage IA clinical stage   Pathology and radiology counseling:Discussed with the patient, the details of pathology including the type of breast cancer,the clinical staging, the significance of ER, PR and HER-2/neu receptors and the implications for treatment. After reviewing the pathology in detail, we proceeded to discuss the different treatment options between surgery, radiation, chemotherapy, antiestrogen therapies.  Recommendations: 1. Breast conserving surgery (bilateral) followed by 2. Oncotype DX testing to determine if chemotherapy would be of any benefit followed by 3. Adjuvant radiation therapy followed by 4. Adjuvant antiestrogen therapy  Oncotype counseling: I discussed Oncotype DX test. I explained to the patient that this is a 21 gene panel to evaluate patient tumors DNA to calculate recurrence score. This would help determine whether patient has high risk or intermediate risk or low risk breast cancer. She understands that if her tumor was found to be high risk, she would benefit from systemic chemotherapy. If low risk, no need of chemotherapy. If she was found to be intermediate risk, we would need to evaluate the score as well as other risk factors and determine if an abbreviated chemotherapy may be of benefit.  Return to clinic after surgery to discuss final pathology report and then determine if Oncotype DX testing will need to be sent.

## 2017-05-23 NOTE — Progress Notes (Signed)
Radiation Oncology         [phone] ________________________________  Name: Angela Buchanan        MRN: [id number]  Date of Service: 05/23/2017 DOB: Oct 30, 1950  HW:EXHB, Arbie Cookey, MD  Fanny Skates, MD     REFERRING PHYSICIAN: Fanny Skates, MD   DIAGNOSIS: The encounter diagnosis was Malignant neoplasm of upper-outer quadrant of right breast in female, estrogen receptor positive ([place]).   HISTORY OF PRESENT ILLNESS: Angela Buchanan is a 66 y.o. female seen in the multidisciplinary breast clinic for a new diagnosis of right breast cancer. The patient was found to have a possible mass of the right breast and a distortion in the left breast. Diagnostic imaging on 05/01/17 revealed a 1.1 x .8 x 1.0 cm mass in the right breast at 10:00, and the axilla was negative for adenopathy. 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 She comes today to discuss options for treatment of her cancer.    PREVIOUS RADIATION THERAPY: No   PAST MEDICAL HISTORY:  Past Medical History:  Diagnosis Date  . Bigeminy   . Chest pain   . Diverticular disease   . Family history of heart disease   . H/O mumps   . H/O sinusitis 2011  . H/O varicella   . Hayfever 2011  . Hypertension   . Left hip pain 2011  . Yeast vaginitis 2006       PAST SURGICAL HISTORY: Past Surgical History:  Procedure Laterality Date  . ABDOMINAL HYSTERECTOMY    . CATARACT EXTRACTION Bilateral   . TONSILLECTOMY       FAMILY HISTORY:  Family History  Problem Relation Age of Onset  . Hypertension Sister   . Dementia Father   . Stroke Sister   . Breast cancer Neg Hx      SOCIAL HISTORY:  reports that she has never smoked. She has never used smokeless tobacco. She reports that she does not drink alcohol or use drugs.   ALLERGIES:  Atenolol hydrochloride; Bisoprolol; and Prednisone   MEDICATIONS:  Current Outpatient Prescriptions  Medication Sig Dispense Refill  . aspirin 81 MG tablet Take 81 mg by mouth daily.    Marland Kitchen b complex vitamins tablet Take 1 tablet by mouth daily.    . Calcium Citrate 250 MG TABS Take 250 mg by mouth 4 (four) times daily.    . cholecalciferol (VITAMIN D) 1000 units tablet Take 2,000 Units by mouth daily.    . citalopram (CELEXA) 10 MG tablet Take 10 mg by mouth daily as needed (anxiety).     . fluticasone (FLONASE) 50 MCG/ACT nasal spray     . lisinopril (PRINIVIL,ZESTRIL) 20 MG tablet Take 1 tablet (20 mg total) by mouth daily. 90 tablet 1  . montelukast (SINGULAIR) 10 MG tablet Take 10 mg by mouth at bedtime.     . Multiple Vitamin (MULTIVITAMIN WITH MINERALS) TABS tablet Take 1 tablet by mouth daily.    Marland Kitchen PAZEO 0.7 % SOLN Place 1 drop into the right eye as needed.     No current facility-administered medications for this encounter.      REVIEW OF SYSTEMS: On review of systems, the patient reports that she is doing well overall. She denies any chest pain, shortness of breath, cough, fevers, chills, night sweats, unintended weight changes.  She denies any bowel or bladder disturbances, and denies abdominal pain, nausea or vomiting. She denies any new musculoskeletal or joint aches or pains. A complete review of systems is obtained and is otherwise negative.     PHYSICAL EXAM:  Wt Readings from Last 3 Encounters:  05/23/17 115 lb 8 oz (52.4 kg)  08/08/16 110 lb 9.6 oz (50.2 kg)  01/26/16 110 lb (49.9 kg)   Temp Readings from Last 3 Encounters:  05/23/17 97.7 F (36.5 C) (Oral)  10/20/11 97.8 F (36.6 C) (Oral)   BP Readings from Last 3 Encounters:  05/23/17 (!) 149/61  08/08/16 134/80  01/26/16 (!) 143/80   Pulse Readings from Last 3 Encounters:  05/23/17 66  08/08/16 (!) 58  01/26/16 67      In general this is a well appearing caucasian female in no acute distress. She is  alert and oriented x4 and appropriate throughout the examination. HEENT reveals that the patient is normocephalic, atraumatic. EOMs are intact. PERRLA. Skin is intact without any evidence of gross lesions. Cardiovascular exam reveals a regular rate and rhythm, no clicks rubs or murmurs are auscultated. Chest is clear to auscultation bilaterally. Lymphatic assessment is performed and does not reveal any adenopathy in the cervical, or inguinal chains. There is a 1 cm mobile node in the right supraclavicular region on the right, none is appreciated on the left. Bilateral breast exam reveals ecchymotic changes over the majority of the lateral aspects of her breasts consistent with hematomas. No nipple bleeding or discharge is noted. Abdomen has active bowel sounds in all quadrants and is intact. The abdomen is soft, non tender, non distended. Lower extremities are negative for pretibial pitting edema, deep calf tenderness, cyanosis or clubbing.   ECOG = 1  0 - Asymptomatic (Fully active, able to carry on all predisease activities without restriction)  1 - Symptomatic but completely ambulatory (Restricted in physically strenuous activity but ambulatory and able to carry out work of a light or sedentary nature. For example, light housework, office work)  2 - Symptomatic, <50% in bed during the day (Ambulatory and capable of all self care but unable to carry out any work activities. Up and about more than 50% of waking hours)  3 - Symptomatic, >50% in bed, but not bedbound (Capable of only limited self-care, confined to bed or chair 50% or more of waking hours)  4 - Bedbound (Completely disabled. Cannot carry on any self-care. Totally confined to bed or chair)  5 - Death   Eustace Pen MM, Creech RH, Tormey DC, et al. [phone]). "Toxicity and response criteria of the [facility]". [place] Oncol. 5 (6): 649-55    LABORATORY DATA:  Lab Results  Component Value Date   WBC 5.1  05/23/2017   HGB 12.5 05/23/2017   HCT 36.6 05/23/2017   MCV 97.5 05/23/2017   PLT 433 (H) 05/23/2017   Lab Results  Component Value Date   NA 138 05/23/2017   K 3.8 05/23/2017   CL 103 10/20/2011   CO2 25 05/23/2017   Lab Results  Component Value Date   ALT 12 05/23/2017   AST 20 05/23/2017   ALKPHOS 99 05/23/2017   BILITOT 0.50 05/23/2017      RADIOGRAPHY: US Breast Ltd Uni Left Inc Axilla  Result Date: 05/01/2017 CLINICAL DATA:  Screening recall for a possible right breast mass and possible left breast distortion. EXAM: 2D DIGITAL DIAGNOSTIC BILATERAL MAMMOGRAM WITH CAD AND ADJUNCT TOMO BILATERAL BREAST ULTRASOUND  COMPARISON:  Previous exam(s). ACR Breast Density Category c: The breast tissue is heterogeneously dense, which may obscure small masses. FINDINGS: In the superficial upper outer quadrant of the right breast there is a small spiculated mass. In the upper slightly inner quadrant of the left breast there is a suspicious area of distortion. Mammographic images were processed with CAD. Physical exam of the upper-outer quadrant of the right breast demonstrates a firm fixed mass with indentation of the overlying skin. Physical exam of the upper inner left breast demonstrates no definite palpable masses. Ultrasound targeted to the right breast at 10 o'clock, 3 cm from the nipple demonstrates a superficial irregular hypoechoic vascular mass measuring 1.1 x 0.8 x 1.0 cm. Ultrasound of the right axilla demonstrates multiple normal-appearing lymph nodes. Ultrasound of the upper inner quadrant of the left breast demonstrates no definite mass or area of shadowing. Ultrasound of the left axilla demonstrates multiple normal-appearing lymph nodes. IMPRESSION: 1. There is a highly suspicious mass in the right breast at 10 o'clock. 2. There is a suspicious area of distortion without sonographic correlate in the upper inner left breast. 3.  No evidence of axillary lymphadenopathy. RECOMMENDATION:  1. Ultrasound-guided biopsy is recommended for the right breast mass at 10 o'clock. 2. Stereotactic biopsy is recommended for the left breast distortion. These procedures have been scheduled for 05/04/2017 at 1:45 p.m. I have discussed the findings and recommendations with the patient. Results were also provided in writing at the conclusion of the visit. If applicable, a reminder letter will be sent to the patient regarding the next appointment. BI-RADS CATEGORY  5: Highly suggestive of malignancy. Electronically Signed   By: Ammie Ferrier M.D.   On: 05/01/2017 16:15   US Breast Ltd Uni Right Inc Axilla  Result Date: 05/01/2017 CLINICAL DATA:  Screening recall for a possible right breast mass and possible left breast distortion. EXAM: 2D DIGITAL DIAGNOSTIC BILATERAL MAMMOGRAM WITH CAD AND ADJUNCT TOMO BILATERAL BREAST ULTRASOUND COMPARISON:  Previous exam(s). ACR Breast Density Category c: The breast tissue is heterogeneously dense, which may obscure small masses. FINDINGS: In the superficial upper outer quadrant of the right breast there is a small spiculated mass. In the upper slightly inner quadrant of the left breast there is a suspicious area of distortion. Mammographic images were processed with CAD. Physical exam of the upper-outer quadrant of the right breast demonstrates a firm fixed mass with indentation of the overlying skin. Physical exam of the upper inner left breast demonstrates no definite palpable masses. Ultrasound targeted to the right breast at 10 o'clock, 3 cm from the nipple demonstrates a superficial irregular hypoechoic vascular mass measuring 1.1 x 0.8 x 1.0 cm. Ultrasound of the right axilla demonstrates multiple normal-appearing lymph nodes. Ultrasound of the upper inner quadrant of the left breast demonstrates no definite mass or area of shadowing. Ultrasound of the left axilla demonstrates multiple normal-appearing lymph nodes. IMPRESSION: 1. There is a highly suspicious mass in  the right breast at 10 o'clock. 2. There is a suspicious area of distortion without sonographic correlate in the upper inner left breast. 3.  No evidence of axillary lymphadenopathy. RECOMMENDATION: 1. Ultrasound-guided biopsy is recommended for the right breast mass at 10 o'clock. 2. Stereotactic biopsy is recommended for the left breast distortion. These procedures have been scheduled for 05/04/2017 at 1:45 p.m. I have discussed the findings and recommendations with the patient. Results were also provided in writing at the conclusion of the visit. If applicable, a reminder letter will be sent  to the patient regarding the next appointment. BI-RADS CATEGORY  5: Highly suggestive of malignancy. Electronically Signed   By: Ammie Ferrier M.D.   On: 05/01/2017 16:15   Mm Diag Breast Tomo Bilateral  Result Date: 05/01/2017 CLINICAL DATA:  Screening recall for a possible right breast mass and possible left breast distortion. EXAM: 2D DIGITAL DIAGNOSTIC BILATERAL MAMMOGRAM WITH CAD AND ADJUNCT TOMO BILATERAL BREAST ULTRASOUND COMPARISON:  Previous exam(s). ACR Breast Density Category c: The breast tissue is heterogeneously dense, which may obscure small masses. FINDINGS: In the superficial upper outer quadrant of the right breast there is a small spiculated mass. In the upper slightly inner quadrant of the left breast there is a suspicious area of distortion. Mammographic images were processed with CAD. Physical exam of the upper-outer quadrant of the right breast demonstrates a firm fixed mass with indentation of the overlying skin. Physical exam of the upper inner left breast demonstrates no definite palpable masses. Ultrasound targeted to the right breast at 10 o'clock, 3 cm from the nipple demonstrates a superficial irregular hypoechoic vascular mass measuring 1.1 x 0.8 x 1.0 cm. Ultrasound of the right axilla demonstrates multiple normal-appearing lymph nodes. Ultrasound of the upper inner quadrant of the left  breast demonstrates no definite mass or area of shadowing. Ultrasound of the left axilla demonstrates multiple normal-appearing lymph nodes. IMPRESSION: 1. There is a highly suspicious mass in the right breast at 10 o'clock. 2. There is a suspicious area of distortion without sonographic correlate in the upper inner left breast. 3.  No evidence of axillary lymphadenopathy. RECOMMENDATION: 1. Ultrasound-guided biopsy is recommended for the right breast mass at 10 o'clock. 2. Stereotactic biopsy is recommended for the left breast distortion. These procedures have been scheduled for 05/04/2017 at 1:45 p.m. I have discussed the findings and recommendations with the patient. Results were also provided in writing at the conclusion of the visit. If applicable, a reminder letter will be sent to the patient regarding the next appointment. BI-RADS CATEGORY  5: Highly suggestive of malignancy. Electronically Signed   By: Ammie Ferrier M.D.   On: 05/01/2017 16:15   Mm Screening Breast Tomo Bilateral  Result Date: 04/24/2017 CLINICAL DATA:  Screening. EXAM: 2D DIGITAL SCREENING BILATERAL MAMMOGRAM WITH CAD AND ADJUNCT TOMO COMPARISON:  Previous exam(s). ACR Breast Density Category c: The breast tissue is heterogeneously dense, which may obscure small masses. FINDINGS: In the right breast a mass requires further evaluation. In the left breast an area of distortion requires further evaluation. Images were processed with CAD. IMPRESSION: Further evaluation is suggested for a mass in the right breast. Further evaluation is suggested for distortion in the left breast. RECOMMENDATION: Diagnostic mammogram and possibly ultrasound of both breasts. (Code:[id number]-[id number]) The patient will be contacted regarding the findings, and additional imaging will be scheduled. BI-RADS CATEGORY  0: Incomplete. Need additional imaging evaluation and/or prior mammograms for comparison. Electronically Signed   By: Claudie Revering M.D.   On: 04/24/2017  17:10   Mm Clip Placement Left  Result Date: 05/16/2017 CLINICAL DATA:  Status post 3D stereotactic guided core needle biopsy of an area of architectural distortion in upper inner quadrant of the left breast. EXAM: DIAGNOSTIC LEFT MAMMOGRAM POST STEREOTACTIC BIOPSY COMPARISON:  Previous exam(s). FINDINGS: Mammographic images were obtained following 3D stereotactic guided biopsy of the recently demonstrated architectural distortion in the upper inner quadrant of the left breast. These demonstrate a coil shaped biopsy marker clip 5 mm lateral to the center of the area of biopsied  distortion. There are post biopsy changes at the center of the biopsied area of distortion. IMPRESSION: Coil shaped biopsy marker clip 5 mm lateral to the center of biopsied distortion in the upper inner quadrant of the left breast. Final Assessment: Post Procedure Mammograms for Marker Placement Electronically Signed   By: Claudie Revering M.D.   On: 05/16/2017 13:39   Mm Clip Placement Right  Result Date: 05/16/2017 CLINICAL DATA:  Status post ultrasound-guided core needle biopsy of a 1.1 cm mass in the 10 o'clock position of the right breast. EXAM: DIAGNOSTIC RIGHT MAMMOGRAM POST ULTRASOUND BIOPSY COMPARISON:  Previous exam(s). FINDINGS: Mammographic images were obtained following ultrasound guided biopsy of the recently demonstrated 1.1 cm mass in the 10 o'clock position of the right breast. These demonstrate a ribbon shaped biopsy marker clip at the posterior margin of the biopsied mass. IMPRESSION: Appropriate clip deployment following right breast ultrasound-guided core needle biopsy. Final Assessment: Post Procedure Mammograms for Marker Placement Electronically Signed   By: Claudie Revering M.D.   On: 05/16/2017 14:18   Mm Lt Breast Bx W Loc Dev 1st Lesion Image Bx Spec Stereo Guide  Addendum Date: 05/18/2017   ADDENDUM REPORT: 05/18/2017 07:31 ADDENDUM: Pathology revealed a complex sclerosing lesion with calcifications and  sclerosing adenosis in the LEFT breast. Excision is recommended. Pathology reveals grade II invasive ductal carcinoma and ductal carcinoma in situ in the RIGHT breast. This was found to be concordant by Dr. Claudie Revering. Pathology results were discussed with the patient by telephone. The patient reported doing well after the biopsy. Post biopsy instructions and care were reviewed and questions were answered. The patient was encouraged to call [facility] for any additional concerns. The patient was referred to the [facility] at the [facility] on May 23, 2017. Pathology results reported by Susa Raring RN, BSN on 05/18/2017. Electronically Signed   By: Claudie Revering M.D.   On: 05/18/2017 07:31   Result Date: 05/18/2017 CLINICAL DATA:  Architectural distortion in the upper, slightly inner left breast at recent mammography, with no ultrasound correlate. EXAM: LEFT BREAST STEREOTACTIC CORE NEEDLE BIOPSY COMPARISON:  Previous exams. FINDINGS: The patient and I discussed the procedure of stereotactic-guided biopsy including benefits and alternatives. We discussed the high likelihood of a successful procedure. We discussed the risks of the procedure including infection, bleeding, tissue injury, clip migration, and inadequate sampling. Informed written consent was given. The usual time out protocol was performed immediately prior to the procedure. Using sterile technique and 1% Lidocaine as local anesthetic, under stereotactic guidance, a 9 gauge vacuum assisted device was used to perform core needle biopsy of the recently demonstrated architectural distortion in the upper, slightly inner left breast using a medial approach. Lesion quadrant: Upper inner quadrant At the conclusion of the procedure, a coil shaped tissue marker clip was deployed into the biopsy cavity. Follow-up 2-view mammogram was performed and dictated separately. IMPRESSION:  Stereotactic-guided biopsy of an area of architectural distortion in the upper, slightly inner left breast. No apparent complications. Electronically Signed: By: Claudie Revering M.D. On: 05/16/2017 13:24   [place] Rt Breast Bx W Loc Dev 1st Lesion Img Bx Spec US Guide  Addendum Date: 05/18/2017   ADDENDUM REPORT: 05/18/2017 07:32 ADDENDUM: Pathology revealed a complex sclerosing lesion with calcifications and sclerosing adenosis in the LEFT breast. Excision is recommended. Pathology reveals grade II invasive ductal carcinoma and ductal carcinoma in situ in the RIGHT breast. This was found  to be concordant by Dr. Claudie Revering. Pathology results were discussed with the patient by telephone. The patient reported doing well after the biopsy. Post biopsy instructions and care were reviewed and questions were answered. The patient was encouraged to call [facility] for any additional concerns. The patient was referred to the [facility] at the [facility] on May 23, 2017. Pathology results reported by Susa Raring RN, BSN on 05/18/2017. Electronically Signed   By: Claudie Revering M.D.   On: 05/18/2017 07:32   Result Date: 05/18/2017 CLINICAL DATA:  1.1 cm mass with recent imaging findings highly suspicious for malignancy in the 10 o'clock position of the right breast, 3 cm from the nipple. EXAM: ULTRASOUND GUIDED RIGHT BREAST CORE NEEDLE BIOPSY COMPARISON:  Previous exam(s). FINDINGS: I met with the patient and we discussed the procedure of ultrasound-guided biopsy, including benefits and alternatives. We discussed the high likelihood of a successful procedure. We discussed the risks of the procedure, including infection, bleeding, tissue injury, clip migration, and inadequate sampling. Informed written consent was given. The usual time-out protocol was performed immediately prior to the procedure. Lesion quadrant: Upper outer quadrant Using sterile  technique and 1% Lidocaine as local anesthetic, under direct ultrasound visualization, a 14 gauge spring-loaded device was used to perform biopsy of the recently demonstrated 1.1 cm mass in the 10 o'clock position of the right breast, 3 cm from the nipple, using a caudal approach. At the conclusion of the procedure a ribbon shaped tissue marker clip was deployed into the biopsy cavity. Follow up 2 view mammogram was performed and dictated separately. IMPRESSION: Ultrasound guided biopsy of a 1.1 cm mass in the 10 o'clock position of the right breast. No apparent complications. Electronically Signed: By: Claudie Revering M.D. On: 05/16/2017 14:02       IMPRESSION/PLAN: 1. Stage IA, cT1c, cN0, cM0, Grade 2, ER/PR positive invaisve ductal carcinoma of the right breast and a complex sclerosing lesion of the left breast. Dr. Lisbeth Renshaw discusses the pathology findings and reviews the nature of invasive breast cancer and sclerosing disease. The consensus from the breast conference include breast conservation with lumpectomy with sentinel mapping of the right breast, as well as a left lumpectomy to address the complex sclerosing lesion. Prior to this a CT neck will also be performed given the questionable right supraclavicular adenopathy. Oncotype testing will likely be performed depending on the final measurements of her specimen.  Provided that chemotherapy is not indicated, the patient's course would then be followed by external radiotherapy to the breast followed by antiestrogen therapy. We discussed the risks, benefits, short, and long term effects of radiotherapy, and the patient is interested in proceeding. Dr. Lisbeth Renshaw discusses the delivery and logistics of radiotherapy and would anticipate a course of 4 weeks of treatment. We will see her back about 2 weeks after surgery to move forward with the simulation and planning process and anticipate starting radiotherapy about 4 weeks after surgery.  2.  Possible right  supraclavicular adenopathy. Dr. Dalbert Batman recommends proceeding with CT of the neck to better classify if she has adenopathy.   The above documentation reflects my direct findings during this shared patient visit. Please see the separate note by Dr. Lisbeth Renshaw on this date for the remainder of the patient's plan of care.    Carola Rhine, PAC

## 2017-05-23 NOTE — Patient Instructions (Signed)

## 2017-05-23 NOTE — Progress Notes (Signed)
[place] NOTE  Patient Care Team: [name], MD as PCP - General (Family Medicine) Fanny Skates, MD as Consulting Physician (General Surgery) Nicholas Lose, MD as Consulting Physician (Hematology and Oncology) Kyung Rudd, MD as Consulting Physician (Radiation Oncology)  CHIEF COMPLAINTS/PURPOSE OF CONSULTATION:  Newly diagnosed breast cancer  HISTORY OF PRESENTING ILLNESS:  Angela Buchanan 65 y.o. female is here because of recent diagnosis of right breast cancer. Patient had a routine screening mammogram that detected a mass at 10:00 position measuring 11 mm in size. Axilla was negative. This was not a quadrant. She also had a left breast abnormality which on biopsy came back as complex sclerosing lesion. 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 She has bruising related to recent biopsy. She was presented this morning to the multidisciplinary tumor board and she is here today to discuss a treatment plan.  I reviewed her records extensively and collaborated the history with the patient.  SUMMARY OF ONCOLOGIC HISTORY:   Malignant neoplasm of upper-outer quadrant of right breast in female, estrogen receptor positive ([place])   05/16/2017 Initial Diagnosis    Left breast biopsy UIQ: Complex sclerosing lesion with calcifications and sclerosing adenosis, right breast biopsy 10:00: IDC grade 2, DCIS, ER/PR positive, HER-2 negative ratio 1.26,  11 mm lesion in the right breast at 10:00 position: T1b N0 stage IA clinical stage        MEDICAL HISTORY:  Past Medical History:  Diagnosis Date  . Bigeminy   . Chest pain   . Diverticular disease   . Family history of heart disease   . H/O mumps   . H/O sinusitis 2011  . H/O varicella   . Hayfever 2011  . Hypertension   . Left hip pain 2011  . Yeast vaginitis 2006    SURGICAL HISTORY: Past Surgical History:  Procedure  Laterality Date  . ABDOMINAL HYSTERECTOMY    . CATARACT EXTRACTION Bilateral   . TONSILLECTOMY      SOCIAL HISTORY: Social History   Social History  . Marital status: Married    Spouse name: N/A  . Number of children: 0  . Years of education: college   Occupational History  . 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 Smoking status: Never Smoker  . Smokeless tobacco: Never Used  . Alcohol use No  . Drug use: No  . Sexual activity: No     Comment: hyst   Other Topics Concern  . Not on file   Social History Narrative   Patient lives at home with husband  ([name]).    Patient is rtired caoll   Right handed.   Caffeine 8oz . Caffeine daily    FAMILY HISTORY: Family History  Problem Relation Age of Onset  . Hypertension Sister   . Dementia Father   . Stroke Sister   . Breast cancer Neg Hx     ALLERGIES:  is allergic to atenolol hydrochloride; bisoprolol; and prednisone.  MEDICATIONS:  Current Outpatient Prescriptions  Medication Sig Dispense Refill  . aspirin 81 MG tablet Take 81 mg by mouth daily.    . Calcium Citrate 250 MG TABS Take 250 mg by mouth 4 (four) times daily.    . cholecalciferol (VITAMIN D) 1000 units tablet Take 2,000 Units by mouth daily.    . citalopram (CELEXA) 10 MG tablet Take 10 mg by mouth  daily as needed (anxiety).     Marland Kitchen lisinopril (PRINIVIL,ZESTRIL) 20 MG tablet Take 1 tablet (20 mg total) by mouth daily. 90 tablet 1  . Multiple Vitamin (MULTIVITAMIN WITH MINERALS) TABS tablet Take 1 tablet by mouth daily.    Marland Kitchen b complex vitamins tablet Take 1 tablet by mouth daily.    . fluticasone (FLONASE) 50 MCG/ACT nasal spray     . montelukast (SINGULAIR) 10 MG tablet Take 10 mg by mouth at bedtime.     Marland Kitchen PAZEO 0.7 % SOLN Place 1 drop into the right eye as needed.     No current facility-administered medications for this visit.     REVIEW OF SYSTEMS:   Constitutional: Denies fevers, chills or abnormal night sweats Eyes:  Denies blurriness of vision, double vision or watery eyes Ears, nose, mouth, throat, and face: Denies mucositis or sore throat Respiratory: Denies cough, dyspnea or wheezes Cardiovascular: Denies palpitation, chest discomfort or lower extremity swelling Gastrointestinal:  Denies nausea, heartburn or change in bowel habits Skin: Denies abnormal skin rashes Lymphatics: Denies new lymphadenopathy or easy bruising Neurological:Denies numbness, tingling or new weaknesses Behavioral/Psych: Mood is stable, no new changes  Breast: Bruising related to recent biopsy All other systems were reviewed with the patient and are negative.  PHYSICAL EXAMINATION: ECOG PERFORMANCE STATUS: 1 - Symptomatic but completely ambulatory  Vitals:   05/23/17 1233  BP: (!) 149/61  Pulse: 66  Resp: 18  Temp: 97.7 F (36.5 C)  SpO2: 100%   Filed Weights   05/23/17 1233  Weight: 115 lb 8 oz (52.4 kg)    GENERAL:alert, no distress and comfortable SKIN: skin color, texture, turgor are normal, no rashes or significant lesions EYES: normal, conjunctiva are pink and non-injected, sclera clear OROPHARYNX:no exudate, no erythema and lips, buccal mucosa, and tongue normal  NECK: supple, thyroid normal size, non-tender, without nodularity LYMPH:  no palpable lymphadenopathy in the cervical, axillary or inguinal LUNGS: clear to auscultation and percussion with normal breathing effort HEART: regular rate & rhythm and no murmurs and no lower extremity edema ABDOMEN:abdomen soft, non-tender and normal bowel sounds Musculoskeletal:no cyanosis of digits and no clubbing  PSYCH: alert & oriented x 3 with fluent speech NEURO: no focal motor/sensory deficits BREAST:Bruising related to recent biopsy. No palpable axillary or supraclavicular lymphadenopathy (exam performed in the presence of a chaperone)   LABORATORY DATA:  I have reviewed the data as listed Lab Results  Component Value Date   WBC 5.1 05/23/2017   HGB 12.5  05/23/2017   HCT 36.6 05/23/2017   MCV 97.5 05/23/2017   PLT 433 (H) 05/23/2017   Lab Results  Component Value Date   NA 138 05/23/2017   K 3.8 05/23/2017   CL 103 10/20/2011   CO2 25 05/23/2017    RADIOGRAPHIC STUDIES: I have personally reviewed the radiological reports and agreed with the findings in the report.  ASSESSMENT AND PLAN:  Malignant neoplasm of upper-outer quadrant of right breast in female, estrogen receptor positive ([place]) 05/16/2017: Left breast biopsy UIQ: Complex sclerosing lesion with calcifications and sclerosing adenosis, right breast biopsy 10:00: IDC grade 2, DCIS, ER/PR positive, HER-2 negative ratio 1.26,  11 mm lesion in the right breast at 10:00 position: T1b N0 stage IA clinical stage   Pathology and radiology counseling:Discussed with the patient, the details of pathology including the type of breast cancer,the clinical staging, the significance of ER, PR and HER-2/neu receptors and the implications for treatment. After reviewing the pathology in detail, we proceeded  to discuss the different treatment options between surgery, radiation, chemotherapy, antiestrogen therapies.  Recommendations: 1. Breast conserving surgery (bilateral) followed by 2. Oncotype DX testing to determine if chemotherapy would be of any benefit followed by 3. Adjuvant radiation therapy followed by 4. Adjuvant antiestrogen therapy  Oncotype counseling: I discussed Oncotype DX test. I explained to the patient that this is a 21 gene panel to evaluate patient tumors DNA to calculate recurrence score. This would help determine whether patient has high risk or intermediate risk or low risk breast cancer. She understands that if her tumor was found to be high risk, she would benefit from systemic chemotherapy. If low risk, no need of chemotherapy. If she was found to be intermediate risk, we would need to evaluate the score as well as other risk factors and determine if an abbreviated  chemotherapy may be of benefit.  Return to clinic after surgery to discuss final pathology report and then determine if Oncotype DX testing will need to be sent.    All questions were answered. The patient knows to call the clinic with any problems, questions or concerns.    Rulon Eisenmenger, MD 05/23/17

## 2017-05-24 ENCOUNTER — Telehealth: Payer: Self-pay | Admitting: *Deleted

## 2017-05-24 ENCOUNTER — Other Ambulatory Visit: Payer: Self-pay | Admitting: General Surgery

## 2017-05-24 DIAGNOSIS — C50411 Malignant neoplasm of upper-outer quadrant of right female breast: Secondary | ICD-10-CM

## 2017-05-24 NOTE — Telephone Encounter (Signed)
  Oncology Nurse Navigator Documentation  Navigator Location: [facility] (05/24/17 1300)   )Navigator Encounter Type: Telephone (05/24/17 1300) Telephone: Outgoing Call;Clinic/[facility] Follow-up (05/24/17 1300)                                                  Time Spent with Patient: 15 (05/24/17 1300)

## 2017-05-25 ENCOUNTER — Ambulatory Visit (HOSPITAL_COMMUNITY)
Admission: RE | Admit: 2017-05-25 | Discharge: 2017-05-25 | Disposition: A | Discharge status: Home / Self Care | Payer: Medicare Other | Source: Ambulatory Visit | Attending: General Surgery | Admitting: General Surgery

## 2017-05-25 ENCOUNTER — Telehealth: Payer: Self-pay

## 2017-05-25 DIAGNOSIS — E041 Nontoxic single thyroid nodule: Secondary | ICD-10-CM | POA: Diagnosis present

## 2017-05-25 DIAGNOSIS — R221 Localized swelling, mass and lump, neck: Secondary | ICD-10-CM

## 2017-05-25 MED ORDER — IOPAMIDOL (ISOVUE-300) INJECTION 61%
75.0000 mL | Freq: Once | INTRAVENOUS | Status: AC | PRN
  Administered 2017-05-25: 75 mL via INTRAVENOUS

## 2017-05-25 MED ORDER — IOPAMIDOL (ISOVUE-300) INJECTION 61%
INTRAVENOUS | Status: AC
  Filled 2017-05-25: qty 75

## 2017-05-25 NOTE — Telephone Encounter (Signed)
Spoke with patient and she is aware of her f/u appt per 82/4 inbasket

## 2017-06-02 DIAGNOSIS — N6022 Fibroadenosis of left breast: Secondary | ICD-10-CM

## 2017-06-02 DIAGNOSIS — C50919 Malignant neoplasm of unspecified site of unspecified female breast: Secondary | ICD-10-CM

## 2017-06-02 HISTORY — DX: Fibroadenosis of left breast: N60.22

## 2017-06-02 HISTORY — DX: Malignant neoplasm of unspecified site of unspecified female breast: C50.919

## 2017-06-15 ENCOUNTER — Encounter (HOSPITAL_BASED_OUTPATIENT_CLINIC_OR_DEPARTMENT_OTHER): Payer: Self-pay | Admitting: *Deleted

## 2017-06-15 NOTE — Pre-Procedure Instructions (Signed)
To come pick up Ensure pre-surgery drink 10 oz. - to drink by 0515 DOS.

## 2017-06-21 ENCOUNTER — Ambulatory Visit
Admission: RE | Admit: 2017-06-21 | Discharge: 2017-06-21 | Disposition: A | Discharge status: Home / Self Care | Payer: Medicare Other | Source: Ambulatory Visit | Attending: General Surgery | Admitting: General Surgery

## 2017-06-21 DIAGNOSIS — C50411 Malignant neoplasm of upper-outer quadrant of right female breast: Secondary | ICD-10-CM

## 2017-06-21 NOTE — H&P (Signed)
Angela Buchanan Location: [facility] Patient #: [id number] DOB: [birth year] Married / Language: English / Race: White Female        History of Present Illness       The patient is a 66 year old female who presents with breast cancer. This is a 66 year old female who returns with her husband for a preoperative exam prior to bilateral breast surgery. She has cancer on the right and complex sclerosing lesion on the left and had large hematomas following image guided biopsies. She is scheduled for bilateral breast surgery on September 21. Dr. Lindi Adie and Dr. Lisbeth Renshaw or involved. Angela Buchanan is her PCP.     Recent imaging studies show a 1.1 cm spiculated mass in the right breast, upper outer quadrant, 1 o'clock position. Right axillary ultrasound was negative. Biopsy of the right breast mass shows invasive ductal carcinoma, grade 2, receptor positive, HER-2/neu negative, Ki-67 15%    Also noted was a suspicious distortion in the left breast, upper inner quadrant, posterior. Left axillary ultrasound negative. Stereotactic biopsy confirmed complex sclerosing lesion and excision recommended.      Thickening of the right supraclavicular area was question but CT scan is negative.     She desires breast conservation surgery and she is likely a good candidate for that   She had large hematomas bilaterally and so we postponed her surgery a little bit and reexamining her today. These include hypertension. TAH without BSO through Pfannenstiel incision. Mild anxiety Family history negative for cancer. Mother had a stroke. Father had dementia. 2 sisters living She is married and her husband is present with her. No children. Denies tobacco. Lives in [place]      Her exam today showed the hematomas were a good deal smaller. I think it's okay to go ahead with surgery on September 21. She is scheduled for injection blue dye right breast, right breast lumpectomy with RSL, right  axillary deep Sentinel lymph node biopsy, left breast lumpectomy with RSL.Marland Kitchen She understands the indications, techniques, and risk of the surgery in detail    Allergies  No Known Allergies (Marked as Inactive) Atenolol *BETA BLOCKERS*  PredniSONE (Pak) *CORTICOSTEROIDS*  Bisoprolol Fumarate *BETA BLOCKERS*  Allergies Reconciled   Medication History  Aspirin (81MG Tablet DR, Oral) Active. Calcium Citrate + D (250-200MG-UNIT Tablet, Oral) Active. Lisinopril (20MG Tablet, Oral) Active. Vitamin B Complex (Oral) Active. Vitamin D (2000UNIT Capsule, Oral) Active. Ibuprofen (600MG Tablet, Oral) Active. Selenium (100MCG Capsule, Oral) Active. CeleXA (10MG Tablet, Oral) Active. Flonase (50MCG/ACT Suspension, Nasal) Active. Singulair (10MG Tablet, Oral) Active. Medications Reconciled  Vitals  Weight: 113.13 lb Height: 62in Body Surface Area: 1.5 m Body Mass Index: 20.69 kg/m  Temp.: 98.83F  Pulse: 85 (Regular)  Resp.: 18 (Unlabored)  P.OX: 99% (Room air) BP: 128/82 (Sitting, Left Arm, Standard)   Physical Exam  General Mental Status-Alert. General Appearance-Not in acute distress. Build & Nutrition-Well nourished. Posture-Normal posture. Gait-Normal.  Head and Neck Head-normocephalic, atraumatic with no lesions or palpable masses. Trachea-midline. Thyroid Gland Characteristics - normal size and consistency and no palpable nodules.  Chest and Lung Exam Chest and lung exam reveals -on auscultation, normal breath sounds, no adventitious sounds and normal vocal resonance.  Breast Note: Breasts are small. 32-30 4B by history. 2 cm hematoma with ecchymoses left breast superiorly. 2 cm hematoma right breast upper outer quadrant. No axillary adenopathy.   Cardiovascular Cardiovascular examination reveals -normal heart sounds, regular rate and rhythm with no murmurs and femoral artery auscultation bilaterally reveals  normal pulses,  no bruits, no thrills.  Abdomen Inspection Inspection of the abdomen reveals - No Hernias. Palpation/Percussion Palpation and Percussion of the abdomen reveal - Soft, Non Tender, No Rigidity (guarding), No hepatosplenomegaly and No Palpable abdominal masses.  Neurologic Neurologic evaluation reveals -alert and oriented x 3 with no impairment of recent or remote memory, normal attention span and ability to concentrate, normal sensation and normal coordination.  Musculoskeletal Normal Exam - Bilateral-Upper Extremity Strength Normal and Lower Extremity Strength Normal.    Assessment & Plan PRIMARY CANCER OF UPPER OUTER QUADRANT OF RIGHT FEMALE BREAST (C50.411)   The hematomas in your right breast and a left breast are smaller I think it is safe to go ahead with your bilateral breast surgery on September 21 you will have radioactive seeds placed in both breasts before the surgery  You are scheduled for right breast lumpectomy and right axillary sentinel node biopsy as well as left breast lumpectomy on September 21 You will have bilateral radioactive seeds placed a day or 2 prior to the surgery We have discussed the indications, techniques, and risks of the surgery in detail  Decisions regarding chemotherapy and radiation therapy will be made later after we review all of the final pathology and testing.  ABNORMAL MAMMOGRAM OF LEFT BREAST (R92.8) HISTORY OF HYSTERECTOMY (Z90.710) HYPERTENSION, BENIGN (I10)    Angela Buchanan M. Dalbert Batman, M.D., [facility] General and Minimally invasive Surgery Breast and Colorectal Surgery Office:   [phone] Pager:   [phone]

## 2017-06-21 NOTE — Anesthesia Preprocedure Evaluation (Addendum)
Anesthesia Evaluation  Patient identified by MRN, date of birth, ID band Patient awake    Reviewed: Allergy & Precautions, NPO status , Patient's Chart, lab work & pertinent test results  Airway Mallampati: II  TM Distance: >3 FB Neck ROM: Full    Dental  (+) Dental Advisory Given   Pulmonary neg pulmonary ROS,    breath sounds clear to auscultation       Cardiovascular hypertension, Pt. on medications  Rhythm:Regular Rate:Normal     Neuro/Psych negative neurological ROS     GI/Hepatic negative GI ROS, Neg liver ROS,   Endo/Other  negative endocrine ROS  Renal/GU negative Renal ROS     Musculoskeletal   Abdominal   Peds  Hematology negative hematology ROS (+)   Anesthesia Other Findings   Reproductive/Obstetrics                            Anesthesia Physical Anesthesia Plan  ASA: II  Anesthesia Plan: General   Post-op Pain Management:  Regional for Post-op pain   Induction: Intravenous  PONV Risk Score and Plan: 3 and Ondansetron, Dexamethasone and Treatment may vary due to age or medical condition  Airway Management Planned: LMA  Additional Equipment:   Intra-op Plan:   Post-operative Plan: Extubation in OR  Informed Consent: I have reviewed the patients History and Physical, chart, labs and discussed the procedure including the risks, benefits and alternatives for the proposed anesthesia with the patient or authorized representative who has indicated his/her understanding and acceptance.   Dental advisory given  Plan Discussed with: CRNA  Anesthesia Plan Comments:        Anesthesia Quick Evaluation

## 2017-06-21 NOTE — Progress Notes (Signed)
Ensure pre surgery drink given with instructions to complete by 0515 dos, pt verbalized understanding. 

## 2017-06-22 ENCOUNTER — Ambulatory Visit (HOSPITAL_BASED_OUTPATIENT_CLINIC_OR_DEPARTMENT_OTHER): Payer: Medicare Other | Admitting: Anesthesiology

## 2017-06-22 ENCOUNTER — Encounter (HOSPITAL_BASED_OUTPATIENT_CLINIC_OR_DEPARTMENT_OTHER)
Admission: RE | Disposition: A | Discharge status: Home / Self Care | Payer: Self-pay | Source: Ambulatory Visit | Attending: General Surgery

## 2017-06-22 ENCOUNTER — Ambulatory Visit
Admission: RE | Admit: 2017-06-22 | Discharge: 2017-06-22 | Disposition: A | Discharge status: Home / Self Care | Payer: Medicare Other | Source: Ambulatory Visit | Attending: General Surgery | Admitting: General Surgery

## 2017-06-22 ENCOUNTER — Encounter (HOSPITAL_COMMUNITY)
Admission: RE | Admit: 2017-06-22 | Discharge: 2017-06-22 | Disposition: A | Discharge status: Home / Self Care | Payer: Medicare Other | Source: Ambulatory Visit | Attending: General Surgery | Admitting: General Surgery

## 2017-06-22 ENCOUNTER — Encounter (HOSPITAL_BASED_OUTPATIENT_CLINIC_OR_DEPARTMENT_OTHER): Payer: Self-pay | Admitting: Certified Registered"

## 2017-06-22 ENCOUNTER — Ambulatory Visit (HOSPITAL_BASED_OUTPATIENT_CLINIC_OR_DEPARTMENT_OTHER)
Admission: RE | Admit: 2017-06-22 | Discharge: 2017-06-22 | Disposition: A | Discharge status: Home / Self Care | Payer: Medicare Other | Source: Ambulatory Visit | Attending: General Surgery | Admitting: General Surgery

## 2017-06-22 DIAGNOSIS — Z90710 Acquired absence of both cervix and uterus: Secondary | ICD-10-CM | POA: Insufficient documentation

## 2017-06-22 DIAGNOSIS — N6022 Fibroadenosis of left breast: Secondary | ICD-10-CM | POA: Diagnosis present

## 2017-06-22 DIAGNOSIS — N6011 Diffuse cystic mastopathy of right breast: Secondary | ICD-10-CM | POA: Diagnosis not present

## 2017-06-22 DIAGNOSIS — C50411 Malignant neoplasm of upper-outer quadrant of right female breast: Secondary | ICD-10-CM

## 2017-06-22 DIAGNOSIS — Z823 Family history of stroke: Secondary | ICD-10-CM | POA: Insufficient documentation

## 2017-06-22 DIAGNOSIS — Z170 Estrogen receptor positive status [ER+]: Secondary | ICD-10-CM | POA: Diagnosis not present

## 2017-06-22 DIAGNOSIS — I10 Essential (primary) hypertension: Secondary | ICD-10-CM | POA: Insufficient documentation

## 2017-06-22 DIAGNOSIS — N6091 Unspecified benign mammary dysplasia of right breast: Secondary | ICD-10-CM | POA: Insufficient documentation

## 2017-06-22 DIAGNOSIS — N6012 Diffuse cystic mastopathy of left breast: Secondary | ICD-10-CM | POA: Diagnosis not present

## 2017-06-22 DIAGNOSIS — C773 Secondary and unspecified malignant neoplasm of axilla and upper limb lymph nodes: Secondary | ICD-10-CM | POA: Insufficient documentation

## 2017-06-22 DIAGNOSIS — Z91040 Latex allergy status: Secondary | ICD-10-CM | POA: Insufficient documentation

## 2017-06-22 DIAGNOSIS — Z79899 Other long term (current) drug therapy: Secondary | ICD-10-CM | POA: Insufficient documentation

## 2017-06-22 DIAGNOSIS — Z888 Allergy status to other drugs, medicaments and biological substances status: Secondary | ICD-10-CM | POA: Insufficient documentation

## 2017-06-22 DIAGNOSIS — Z7982 Long term (current) use of aspirin: Secondary | ICD-10-CM | POA: Diagnosis not present

## 2017-06-22 HISTORY — PX: BREAST LUMPECTOMY WITH RADIOACTIVE SEED LOCALIZATION: SHX6424

## 2017-06-22 HISTORY — DX: Dental restoration status: Z98.811

## 2017-06-22 HISTORY — DX: Spontaneous ecchymoses: R23.3

## 2017-06-22 HISTORY — PX: BREAST LUMPECTOMY: SHX2

## 2017-06-22 HISTORY — DX: Fibroadenosis of left breast: N60.22

## 2017-06-22 HISTORY — PX: BREAST LUMPECTOMY WITH RADIOACTIVE SEED AND SENTINEL LYMPH NODE BIOPSY: SHX6550

## 2017-06-22 HISTORY — PX: BREAST EXCISIONAL BIOPSY: SUR124

## 2017-06-22 HISTORY — PX: EXCISION OF BREAST BIOPSY: SHX5822

## 2017-06-22 HISTORY — DX: Other seasonal allergic rhinitis: J30.2

## 2017-06-22 HISTORY — DX: Personal history of other diseases of the digestive system: Z87.19

## 2017-06-22 HISTORY — DX: Other skin changes: R23.8

## 2017-06-22 HISTORY — DX: Malignant neoplasm of unspecified site of unspecified female breast: C50.919

## 2017-06-22 SURGERY — BREAST LUMPECTOMY WITH RADIOACTIVE SEED AND SENTINEL LYMPH NODE BIOPSY
Anesthesia: General | Site: Breast | Laterality: Right

## 2017-06-22 MED ORDER — EPHEDRINE SULFATE 50 MG/ML IJ SOLN
INTRAMUSCULAR | Status: DC | PRN
  Administered 2017-06-22: 10 mg via INTRAVENOUS

## 2017-06-22 MED ORDER — DEXAMETHASONE SODIUM PHOSPHATE 10 MG/ML IJ SOLN
INTRAMUSCULAR | Status: AC
  Filled 2017-06-22: qty 1

## 2017-06-22 MED ORDER — CHLORHEXIDINE GLUCONATE CLOTH 2 % EX PADS: 6.0000 | MEDICATED_PAD | Freq: Once | CUTANEOUS | Status: DC

## 2017-06-22 MED ORDER — CELECOXIB 400 MG PO CAPS
400.0000 mg | ORAL_CAPSULE | ORAL | Status: AC
  Administered 2017-06-22: 400 mg via ORAL

## 2017-06-22 MED ORDER — METHYLENE BLUE 0.5 % INJ SOLN
INTRAVENOUS | Status: DC | PRN
  Administered 2017-06-22: 5 mL via INTRAMUSCULAR

## 2017-06-22 MED ORDER — POTASSIUM CHLORIDE 2 MEQ/ML IV SOLN: INTRAVENOUS | Status: DC

## 2017-06-22 MED ORDER — BUPIVACAINE-EPINEPHRINE 0.5% -1:200000 IJ SOLN
INTRAMUSCULAR | Status: DC | PRN
  Administered 2017-06-22: 24 mL

## 2017-06-22 MED ORDER — CEFAZOLIN SODIUM-DEXTROSE 2-4 GM/100ML-% IV SOLN: 2.0000 g | INTRAVENOUS | Status: DC

## 2017-06-22 MED ORDER — PROPOFOL 10 MG/ML IV BOLUS
INTRAVENOUS | Status: AC
  Filled 2017-06-22: qty 20

## 2017-06-22 MED ORDER — MIDAZOLAM HCL 2 MG/2ML IJ SOLN
INTRAMUSCULAR | Status: AC
  Filled 2017-06-22: qty 2

## 2017-06-22 MED ORDER — FENTANYL CITRATE (PF) 100 MCG/2ML IJ SOLN
INTRAMUSCULAR | Status: AC
  Filled 2017-06-22: qty 2

## 2017-06-22 MED ORDER — HYDROMORPHONE HCL 1 MG/ML IJ SOLN
INTRAMUSCULAR | Status: AC
  Filled 2017-06-22: qty 0.5

## 2017-06-22 MED ORDER — SODIUM CHLORIDE 0.9 % IJ SOLN
INTRAMUSCULAR | Status: AC
  Filled 2017-06-22: qty 10

## 2017-06-22 MED ORDER — HYDROMORPHONE HCL 1 MG/ML IJ SOLN
0.2500 mg | INTRAMUSCULAR | Status: DC | PRN
  Administered 2017-06-22 (×2): 0.25 mg via INTRAVENOUS

## 2017-06-22 MED ORDER — SODIUM CHLORIDE 0.9% FLUSH: 3.0000 mL | INTRAVENOUS | Status: DC | PRN

## 2017-06-22 MED ORDER — GABAPENTIN 300 MG PO CAPS
ORAL_CAPSULE | ORAL | Status: AC
  Filled 2017-06-22: qty 1

## 2017-06-22 MED ORDER — ACETAMINOPHEN 325 MG PO TABS
ORAL_TABLET | ORAL | Status: AC
  Filled 2017-06-22: qty 3

## 2017-06-22 MED ORDER — BUPIVACAINE-EPINEPHRINE (PF) 0.5% -1:200000 IJ SOLN
INTRAMUSCULAR | Status: DC | PRN
  Administered 2017-06-22: 25 mL

## 2017-06-22 MED ORDER — EPHEDRINE 5 MG/ML INJ
INTRAVENOUS | Status: AC
  Filled 2017-06-22: qty 50

## 2017-06-22 MED ORDER — ACETAMINOPHEN 325 MG PO TABS: 650.0000 mg | ORAL_TABLET | ORAL | Status: DC | PRN

## 2017-06-22 MED ORDER — FENTANYL CITRATE (PF) 100 MCG/2ML IJ SOLN
50.0000 ug | INTRAMUSCULAR | Status: AC | PRN
  Administered 2017-06-22 (×2): 50 ug via INTRAVENOUS
  Administered 2017-06-22 (×5): 25 ug via INTRAVENOUS

## 2017-06-22 MED ORDER — SCOPOLAMINE 1 MG/3DAYS TD PT72: 1.0000 | MEDICATED_PATCH | Freq: Once | TRANSDERMAL | Status: DC | PRN

## 2017-06-22 MED ORDER — ONDANSETRON HCL 4 MG/2ML IJ SOLN
INTRAMUSCULAR | Status: DC | PRN
Start: 2017-06-22 — End: 2017-06-22
  Administered 2017-06-22: 4 mg via INTRAVENOUS

## 2017-06-22 MED ORDER — SODIUM CHLORIDE 0.9% FLUSH: 3.0000 mL | Freq: Two times a day (BID) | INTRAVENOUS | Status: DC

## 2017-06-22 MED ORDER — ACETAMINOPHEN 500 MG PO TABS
1000.0000 mg | ORAL_TABLET | ORAL | Status: AC
  Administered 2017-06-22: 975 mg via ORAL

## 2017-06-22 MED ORDER — PROPOFOL 10 MG/ML IV BOLUS
INTRAVENOUS | Status: DC | PRN
  Administered 2017-06-22: 100 mg via INTRAVENOUS

## 2017-06-22 MED ORDER — TECHNETIUM TC 99M SULFUR COLLOID FILTERED
1.0000 | Freq: Once | INTRAVENOUS | Status: AC | PRN
  Administered 2017-06-22: 1 via INTRADERMAL

## 2017-06-22 MED ORDER — MIDAZOLAM HCL 2 MG/2ML IJ SOLN
1.0000 mg | INTRAMUSCULAR | Status: DC | PRN
  Administered 2017-06-22: 0.5 mg via INTRAVENOUS

## 2017-06-22 MED ORDER — METHYLENE BLUE 0.5 % INJ SOLN
INTRAVENOUS | Status: AC
  Filled 2017-06-22: qty 10

## 2017-06-22 MED ORDER — HYDROCODONE-ACETAMINOPHEN 5-325 MG PO TABS: 1.0000 | ORAL_TABLET | Freq: Four times a day (QID) | ORAL | 0 refills | Status: DC | PRN

## 2017-06-22 MED ORDER — GABAPENTIN 300 MG PO CAPS
300.0000 mg | ORAL_CAPSULE | ORAL | Status: AC
  Administered 2017-06-22: 300 mg via ORAL

## 2017-06-22 MED ORDER — FENTANYL CITRATE (PF) 100 MCG/2ML IJ SOLN: 25.0000 ug | INTRAMUSCULAR | Status: DC | PRN

## 2017-06-22 MED ORDER — CEFAZOLIN SODIUM-DEXTROSE 2-3 GM-% IV SOLR
INTRAVENOUS | Status: DC | PRN
  Administered 2017-06-22: 2 g via INTRAVENOUS

## 2017-06-22 MED ORDER — ONDANSETRON HCL 4 MG/2ML IJ SOLN
INTRAMUSCULAR | Status: AC
  Filled 2017-06-22: qty 2

## 2017-06-22 MED ORDER — CELECOXIB 200 MG PO CAPS
ORAL_CAPSULE | ORAL | Status: AC
  Filled 2017-06-22: qty 2

## 2017-06-22 MED ORDER — BUPIVACAINE-EPINEPHRINE (PF) 0.5% -1:200000 IJ SOLN
INTRAMUSCULAR | Status: AC
  Filled 2017-06-22: qty 60

## 2017-06-22 MED ORDER — CEFAZOLIN SODIUM-DEXTROSE 2-4 GM/100ML-% IV SOLN
INTRAVENOUS | Status: AC
  Filled 2017-06-22: qty 100

## 2017-06-22 MED ORDER — ACETAMINOPHEN 650 MG RE SUPP: 650.0000 mg | RECTAL | Status: DC | PRN

## 2017-06-22 MED ORDER — OXYCODONE HCL 5 MG PO TABS
5.0000 mg | ORAL_TABLET | ORAL | Status: DC | PRN
Start: 2017-06-22 — End: 2017-06-22

## 2017-06-22 MED ORDER — PROPOFOL 500 MG/50ML IV EMUL
INTRAVENOUS | Status: DC | PRN
  Administered 2017-06-22: 25 ug/kg/min via INTRAVENOUS

## 2017-06-22 MED ORDER — GLYCOPYRROLATE 0.2 MG/ML IJ SOLN
INTRAMUSCULAR | Status: DC | PRN
  Administered 2017-06-22: 0.2 mg via INTRAVENOUS

## 2017-06-22 MED ORDER — SODIUM CHLORIDE 0.9 % IV SOLN
250.0000 mL | INTRAVENOUS | Status: DC | PRN
Start: 2017-06-22 — End: 2017-06-22

## 2017-06-22 MED ORDER — LACTATED RINGERS IV SOLN
INTRAVENOUS | Status: DC
  Administered 2017-06-22 (×2): via INTRAVENOUS

## 2017-06-22 MED ORDER — DEXAMETHASONE SODIUM PHOSPHATE 10 MG/ML IJ SOLN
INTRAMUSCULAR | Status: DC | PRN
  Administered 2017-06-22: 5 mg via INTRAVENOUS

## 2017-06-22 MED ORDER — LIDOCAINE 2% (20 MG/ML) 5 ML SYRINGE
INTRAMUSCULAR | Status: AC
  Filled 2017-06-22: qty 5

## 2017-06-22 MED ORDER — PROMETHAZINE HCL 25 MG/ML IJ SOLN: 6.2500 mg | INTRAMUSCULAR | Status: DC | PRN

## 2017-06-22 SURGICAL SUPPLY — 71 items
APPLIER CLIP 11 MED OPEN (CLIP)
APPLIER CLIP 9.375 MED OPEN (MISCELLANEOUS) ×3
BANDAGE ACE 6X5 VEL STRL LF (GAUZE/BANDAGES/DRESSINGS) IMPLANT
BENZOIN TINCTURE PRP APPL 2/3 (GAUZE/BANDAGES/DRESSINGS) IMPLANT
BINDER BREAST LRG (GAUZE/BANDAGES/DRESSINGS) IMPLANT
BINDER BREAST MEDIUM (GAUZE/BANDAGES/DRESSINGS) ×3 IMPLANT
BINDER BREAST XLRG (GAUZE/BANDAGES/DRESSINGS) IMPLANT
BINDER BREAST XXLRG (GAUZE/BANDAGES/DRESSINGS) IMPLANT
BLADE HEX COATED 2.75 (ELECTRODE) ×3 IMPLANT
BLADE SURG 10 STRL SS (BLADE) IMPLANT
BLADE SURG 15 STRL LF DISP TIS (BLADE) ×4 IMPLANT
BLADE SURG 15 STRL SS (BLADE) ×2
CANISTER SUC SOCK COL 7IN (MISCELLANEOUS) IMPLANT
CANISTER SUCT 1200ML W/VALVE (MISCELLANEOUS) ×3 IMPLANT
CHLORAPREP W/TINT 26ML (MISCELLANEOUS) ×3 IMPLANT
CLIP APPLIE 11 MED OPEN (CLIP) IMPLANT
CLIP APPLIE 9.375 MED OPEN (MISCELLANEOUS) ×2 IMPLANT
COVER BACK TABLE 60X90IN (DRAPES) ×3 IMPLANT
COVER MAYO STAND STRL (DRAPES) ×3 IMPLANT
COVER PROBE W GEL 5X96 (DRAPES) IMPLANT
DECANTER SPIKE VIAL GLASS SM (MISCELLANEOUS) IMPLANT
DERMABOND ADVANCED (GAUZE/BANDAGES/DRESSINGS) ×2
DERMABOND ADVANCED .7 DNX12 (GAUZE/BANDAGES/DRESSINGS) ×4 IMPLANT
DEVICE DUBIN W/COMP PLATE 8390 (MISCELLANEOUS) ×6 IMPLANT
DRAIN CHANNEL 19F RND (DRAIN) IMPLANT
DRAIN HEMOVAC 1/8 X 5 (WOUND CARE) IMPLANT
DRAPE LAPAROSCOPIC ABDOMINAL (DRAPES) ×3 IMPLANT
DRAPE UTILITY XL STRL (DRAPES) ×3 IMPLANT
DRSG PAD ABDOMINAL 8X10 ST (GAUZE/BANDAGES/DRESSINGS) ×6 IMPLANT
ELECT REM PT RETURN 9FT ADLT (ELECTROSURGICAL) ×3
ELECTRODE REM PT RTRN 9FT ADLT (ELECTROSURGICAL) ×2 IMPLANT
EVACUATOR SILICONE 100CC (DRAIN) IMPLANT
GAUZE SPONGE 4X4 12PLY STRL (GAUZE/BANDAGES/DRESSINGS) ×3 IMPLANT
GAUZE SPONGE 4X4 12PLY STRL LF (GAUZE/BANDAGES/DRESSINGS) IMPLANT
GAUZE SPONGE 4X4 16PLY XRAY LF (GAUZE/BANDAGES/DRESSINGS) IMPLANT
GLOVE EUDERMIC 7 POWDERFREE (GLOVE) IMPLANT
GLOVE EXAM NITRILE MD LF STRL (GLOVE) ×3 IMPLANT
GLOVE SURG SS PI 7.0 STRL IVOR (GLOVE) ×9 IMPLANT
GOWN STRL REUS W/ TWL LRG LVL3 (GOWN DISPOSABLE) IMPLANT
GOWN STRL REUS W/ TWL XL LVL3 (GOWN DISPOSABLE) ×4 IMPLANT
GOWN STRL REUS W/TWL LRG LVL3 (GOWN DISPOSABLE)
GOWN STRL REUS W/TWL XL LVL3 (GOWN DISPOSABLE) ×2
ILLUMINATOR WAVEGUIDE N/F (MISCELLANEOUS) IMPLANT
KIT MARKER MARGIN INK (KITS) ×6 IMPLANT
LIGHT WAVEGUIDE WIDE FLAT (MISCELLANEOUS) IMPLANT
NDL SAFETY ECLIPSE 18X1.5 (NEEDLE) IMPLANT
NEEDLE HYPO 18GX1.5 SHARP (NEEDLE)
NEEDLE HYPO 25X1 1.5 SAFETY (NEEDLE) ×6 IMPLANT
NS IRRIG 1000ML POUR BTL (IV SOLUTION) ×3 IMPLANT
PACK BASIN DAY SURGERY FS (CUSTOM PROCEDURE TRAY) ×3 IMPLANT
PAD ALCOHOL SWAB (MISCELLANEOUS) ×3 IMPLANT
PENCIL BUTTON HOLSTER BLD 10FT (ELECTRODE) ×3 IMPLANT
PIN SAFETY STERILE (MISCELLANEOUS) IMPLANT
SHEET MEDIUM DRAPE 40X70 STRL (DRAPES) ×3 IMPLANT
SLEEVE SCD COMPRESS KNEE MED (MISCELLANEOUS) ×3 IMPLANT
SPONGE LAP 18X18 X RAY DECT (DISPOSABLE) ×6 IMPLANT
SPONGE LAP 4X18 X RAY DECT (DISPOSABLE) IMPLANT
STRIP CLOSURE SKIN 1/2X4 (GAUZE/BANDAGES/DRESSINGS) IMPLANT
SUT ETHILON 3 0 FSL (SUTURE) IMPLANT
SUT MNCRL AB 4-0 PS2 18 (SUTURE) ×9 IMPLANT
SUT SILK 2 0 SH (SUTURE) ×6 IMPLANT
SUT VIC AB 2-0 CT1 27 (SUTURE)
SUT VIC AB 2-0 CT1 TAPERPNT 27 (SUTURE) IMPLANT
SUT VIC AB 3-0 SH 27 (SUTURE)
SUT VIC AB 3-0 SH 27X BRD (SUTURE) IMPLANT
SUT VICRYL 3-0 CR8 SH (SUTURE) ×6 IMPLANT
SYR 10ML LL (SYRINGE) ×6 IMPLANT
TOWEL OR 17X24 6PK STRL BLUE (TOWEL DISPOSABLE) ×3 IMPLANT
TOWEL OR NON WOVEN STRL DISP B (DISPOSABLE) ×3 IMPLANT
TUBE CONNECTING 20X1/4 (TUBING) ×3 IMPLANT
YANKAUER SUCT BULB TIP NO VENT (SUCTIONS) ×3 IMPLANT

## 2017-06-22 NOTE — Anesthesia Postprocedure Evaluation (Signed)
Anesthesia Post Note  Patient: Angela Buchanan  Procedure(s) Performed: Procedure(s) (LRB): RIGHT BREAST LUMPECTOMY WITH RADIOACTIVE SEED AND RIGHT AXILLARY DEEP SENTINEL LYMPH NODE BIOPSY WITH BLUE DYE INJECTION (Right) LEFT BREAST LUMPECTOMY WITH RADIOACTIVE SEED LOCALIZATION (Left)     Patient location during evaluation: PACU Anesthesia Type: General Level of consciousness: awake and alert Pain management: pain level controlled Vital Signs Assessment: post-procedure vital signs reviewed and stable Respiratory status: spontaneous breathing, nonlabored ventilation, respiratory function stable and patient connected to nasal cannula oxygen Cardiovascular status: blood pressure returned to baseline and stable Postop Assessment: no apparent nausea or vomiting Anesthetic complications: no    Last Vitals:  Vitals:   06/22/17 1238 06/22/17 1300  BP:  (!) 145/76  Pulse: 94 89  Resp:  16  Temp:  36.4 C  SpO2: 100% 100%    Last Pain:  Vitals:   06/22/17 1300  TempSrc:   PainSc: 2                  Tiajuana Amass

## 2017-06-22 NOTE — Progress Notes (Signed)
Nuclear Medicine at bedside. Performed time out.  Additional sedation medication given for comfort.  Patient tolerated procedure well.  At bedside to offer support and comfort.

## 2017-06-22 NOTE — Transfer of Care (Signed)
Immediate Anesthesia Transfer of Care Note  Patient: Angela Buchanan  Procedure(s) Performed: Procedure(s): RIGHT BREAST LUMPECTOMY WITH RADIOACTIVE SEED AND RIGHT AXILLARY DEEP SENTINEL LYMPH NODE BIOPSY WITH BLUE DYE INJECTION (Right) LEFT BREAST LUMPECTOMY WITH RADIOACTIVE SEED LOCALIZATION (Left)  Patient Location: PACU  Anesthesia Type:General  Level of Consciousness: sedated  Airway & Oxygen Therapy: Patient Spontanous Breathing and Patient connected to face mask oxygen  Post-op Assessment: Report given to RN  Post vital signs: Reviewed and stable  Last Vitals:  Vitals:   06/22/17 0835 06/22/17 0840  BP: (!) 152/79 (!) 146/69  Pulse: 63 84  Resp: (!) 22 13  Temp:    SpO2: 100% 100%    Last Pain:  Vitals:   06/22/17 0840  TempSrc:   PainSc: 0-No pain         Complications: No apparent anesthesia complications

## 2017-06-22 NOTE — Discharge Instructions (Signed)
[facility] °Office Phone Number [phone] ° °BREAST BIOPSY/ PARTIAL MASTECTOMY: POST OP INSTRUCTIONS ° °Always review your discharge instruction sheet given to you by the facility where your surgery was performed. ° °IF YOU HAVE DISABILITY OR FAMILY LEAVE FORMS, YOU MUST BRING THEM TO THE OFFICE FOR PROCESSING.  DO NOT GIVE THEM TO YOUR DOCTOR. ° °1. A prescription for pain medication may be given to you upon discharge.  Take your pain medication as prescribed, if needed.  If narcotic pain medicine is not needed, then you may take acetaminophen (Tylenol) or ibuprofen (Advil) as needed. °2. Take your usually prescribed medications unless otherwise directed °3. If you need a refill on your pain medication, please contact your pharmacy.  They will contact our office to request authorization.  Prescriptions will not be filled after 5pm or on week-ends. °4. You should eat very light the first 24 hours after surgery, such as soup, crackers, pudding, etc.  Resume your normal diet the day after surgery. °5. Most patients will experience some swelling and bruising in the breast.  Ice packs and a good support bra will help.  Swelling and bruising can take several days to resolve.  °6. It is common to experience some constipation if taking pain medication after surgery.  Increasing fluid intake and taking a stool softener will usually help or prevent this problem from occurring.  A mild laxative (Milk of Magnesia or Miralax) should be taken according to package directions if there are no bowel movements after 48 hours. °7. Unless discharge instructions indicate otherwise, you may remove your bandages 24-48 hours after surgery, and you may shower at that time.  You may have steri-strips (small skin tapes) in place directly over the incision.  These strips should be left on the skin for 7-10 days.  If your surgeon used skin glue on the incision, you may shower in 24 hours.  The glue will flake off over the  next 2-3 weeks.  Any sutures or staples will be removed at the office during your follow-up visit. °8. ACTIVITIES:  You may resume regular daily activities (gradually increasing) beginning the next day.  Wearing a good support bra or sports bra minimizes pain and swelling.  You may have sexual intercourse when it is comfortable. °a. You may drive when you no longer are taking prescription pain medication, you can comfortably wear a seatbelt, and you can safely maneuver your car and apply brakes. °b. RETURN TO WORK:  ______________________________________________________________________________________ °9. You should see your doctor in the office for a follow-up appointment approximately two weeks after your surgery.  Your doctor’s nurse will typically make your follow-up appointment when she calls you with your pathology report.  Expect your pathology report 2-3 business days after your surgery.  You may call to check if you do not hear from us after three days. °10. OTHER INSTRUCTIONS: _______________________________________________________________________________________________ _____________________________________________________________________________________________________________________________________ °_____________________________________________________________________________________________________________________________________ °_____________________________________________________________________________________________________________________________________ ° °WHEN TO CALL YOUR DOCTOR: °1. Fever over 101.0 °2. Nausea and/or vomiting. °3. Extreme swelling or bruising. °4. Continued bleeding from incision. °5. Increased pain, redness, or drainage from the incision. ° °The clinic staff is available to answer your questions during regular business hours.  Please don’t hesitate to call and ask to speak to one of the nurses for clinical concerns.  If you have a medical emergency, go to the nearest  emergency room or call 911.  A surgeon from [facility] is always on call at the hospital. ° °For further questions, please visit [email or web address]  ° ° ° ° °  Post Anesthesia Home Care Instructions ° °Activity: °Get plenty of rest for the remainder of the day. A responsible individual must stay with you for 24 hours following the procedure.  °For the next 24 hours, DO NOT: °-Drive a car °-Operate machinery °-Drink alcoholic beverages °-Take any medication unless instructed by your physician °-Make any legal decisions or sign important papers. ° °Meals: °Start with liquid foods such as gelatin or soup. Progress to regular foods as tolerated. Avoid greasy, spicy, heavy foods. If nausea and/or vomiting occur, drink only clear liquids until the nausea and/or vomiting subsides. Call your physician if vomiting continues. ° °Special Instructions/Symptoms: °Your throat may feel dry or sore from the anesthesia or the breathing tube placed in your throat during surgery. If this causes discomfort, gargle with warm salt water. The discomfort should disappear within 24 hours. ° °If you had a scopolamine patch placed behind your ear for the management of post- operative nausea and/or vomiting: ° °1. The medication in the patch is effective for 72 hours, after which it should be removed.  Wrap patch in a tissue and discard in the trash. Wash hands thoroughly with soap and water. °2. You may remove the patch earlier than 72 hours if you experience unpleasant side effects which may include dry mouth, dizziness or visual disturbances. °3. Avoid touching the patch. Wash your hands with soap and water after contact with the patch. °  ° °

## 2017-06-22 NOTE — Anesthesia Procedure Notes (Signed)
Procedure Name: LMA Insertion Performed by: Verita Lamb Pre-anesthesia Checklist: Patient identified, Emergency Drugs available, Suction available, Patient being monitored and Timeout performed Patient Re-evaluated:Patient Re-evaluated prior to induction Oxygen Delivery Method: Circle system utilized Preoxygenation: Pre-oxygenation with 100% oxygen Induction Type: IV induction LMA: LMA inserted LMA Size: 3.0 Number of attempts: 1 Placement Confirmation: positive ETCO2,  CO2 detector and breath sounds checked- equal and bilateral Tube secured with: Tape Dental Injury: Teeth and Oropharynx as per pre-operative assessment

## 2017-06-22 NOTE — Anesthesia Procedure Notes (Signed)
Anesthesia Regional Block: Pectoralis block   Pre-Anesthetic Checklist: ,, timeout performed, Correct Patient, Correct Site, Correct Laterality, Correct Procedure, Correct Position, site marked, Risks and benefits discussed,  Surgical consent,  Pre-op evaluation,  At surgeon's request and post-op pain management  Laterality: Right  Prep: chloraprep       Needles:  Injection technique: Single-shot  Needle Type: Echogenic Needle     Needle Length: 9cm  Needle Gauge: 21     Additional Needles:   Procedures:,,,, ultrasound used (permanent image in chart),,,,  Narrative:  Start time: 06/22/2017 8:34 AM End time: 06/22/2017 8:40 AM Injection made incrementally with aspirations every 5 mL.  Performed by: Personally  Anesthesiologist: Suzette Battiest

## 2017-06-22 NOTE — Op Note (Signed)
Patient Name:           Angela Buchanan   Date of Surgery:        06/22/2017  Pre op Diagnosis:      Complex sclerosing lesion left breast, central upper inner quadrant                                       Invasive ductal carcinoma right breast, upper outer quadrant, receptor positive  Post op Diagnosis:    Same  Procedure:                 Left breast lumpectomy with radioactive seed localization and margin assessment                                      Inject blue dye right breast                                      Right breast lumpectomy with radioactive seed localization and margin assessment                                      Right axillary deep sentinel lymph node mapping and biopsy  Surgeon:                     Edsel Petrin. Dalbert Batman, M.D., FACS  Assistant:                      OR staff   Indication for Assistant: N/A  Operative Indications:   . This is a 66 year old female who returns with her husband for a preoperative exam prior to bilateral breast surgery. She has cancer on the right and complex sclerosing lesion on the left and had large hematomas following image guided biopsies. She is scheduled for bilateral breast surgery on September 21. Dr. Lindi Adie and Dr. Lisbeth Renshaw or involved. Ileene Rubens is her PCP.     Recent imaging studies showed a 1.1 cm spiculated mass in the right breast, upper outer quadrant, 1 o'clock position. Right axillary ultrasound was negative. Biopsy of the right breast mass shows invasive ductal carcinoma, grade 2, receptor positive, HER-2/neu negative, Ki-67 15%    Also noted was a suspicious distortion in the left breast, upper inner quadrant, posterior. Left axillary ultrasound negative. Stereotactic biopsy confirmed complex sclerosing lesion and excision recommended.      Thickening of the right supraclavicular area was question but CT scan is negative.     She desires breast conservation surgery and she is likely a good candidate for that    She had large hematomas bilaterally and so we postponed her surgery a little bit.  The hematomas have mostly resolved. Family history negative for cancer.  She is scheduled for injection blue dye right breast, right breast lumpectomy with RSL, right axillary deep Sentinel lymph node biopsy, left breast lumpectomy with RSL.Marland Kitchen She understands the indications, techniques, and risk of the surgery in detail  Operative Findings:       On the right side, the cancer was fairly close to the skin and more at the 9:00 position.  This required  a radial ellipse incision to ensure an adequate anterior margin.  The breast were small so the posterior margin was pretty much the pectoralis fascia.   On the right side I found 3 sentinel lymph nodes.  On the left side the marker clip and radioactive seed were fairly central, just above the areolar margin at the 1:00 position.  On the left side the resected area was a little bit deeper and the broad posterior margin of the left side was the pectoralis muscle.  Both specimen mammogram looked very good with marker clip and seed in the center of the specimen and it looked like an adequate margin for both.  Procedure in Detail:          The patient underwent right pectoral block and injection of radionuclide into the right breast by the nuclear medicine technician.  She was taken to the operating room and underwent general anesthesia with LMA device.  Surgical timeout was performed.  Intravenous antibiotics were given.  Following alcohol prep I injected 5 mL of dilute methylene blue into the right breast subareolar area and massaged the breast for a few minutes.     The neck, entire chest bilateral chest and chest wall and axilla were then prepped and draped in a sterile fashion.  0.5% Marcaine with epinephrine was used as a local infiltration anesthetic.  Using the neoprobe I identified the radioactive signal on the left side which was just  above the areolar margin by about 1 cm.   I chose to make a circumareolar incision superiorly, a hidden scar technique.  Lumpectomy was performed with the neoprobe and electrocautery.  The specimen on the left side was removed with and marked with silk sutures and a 6 color ink kit  To orient the pathologist.  On the left of the specimen mammogram looked very good as described above.  Metallic marker clips were placed in the walls of the lumpectomy cavity.  The wound was irrigated and hemostasis was excellent.  The left lumpectomy breast tissue was closed in layers with interrupted sutures of 3-0 Vicryl and the skin was closed with a running subcuticular 4-0 Monocryl and Dermabond.    Attention was directed to the right side.  The radioactive seed in the cancer were actually at about the 9:00 position and was almost palpable.  I made a transverse radial elliptical incision with the knife and performed lumpectomy with electrocautery and the neoprobe.  The specimen mammogram looked very good as described above.  This wound was irrigated.  Hemostasis was excellent.  The walls of the lumpectomy cavity were marked with 5 metal marker clips.  I undermined the breast tissue superiorly and in fairly selective sliding together.  The breast tissues on the right side were closed with multiple layers of 3-0 Vicryl and the skin was closed with a running subcuticular 4-0 Monocryl and Dermabond.    I made a transverse incision in the right axilla at the hairline.  Dissection was carried down through the subcutaneous tissue and the clavipectoral fascia was incised and the axillary space entered.  Using the neoprobe on Tc-99  I mapped out and removed 3 sentinel lymph nodes, all very hot and very blue.  That was all that I found.  They were sent as separate specimens.  Hemostasis in the axilla was excellent.  The wound was irrigated.  The clavipectoral fascia was closed with 3-0 Vicryl sutures and skin closed with a running subcuticular 4-0 Monocryl and Dermabond.  All the  incisions were covered with 4 x 4's ABDs and a breast binder.  Patient tolerated the procedure well was taken to PACU in stable condition.  EBL 30 mL.  Counts correct.  Complications none.      Edsel Petrin. Dalbert Batman, M.D., FACS General and Minimally Invasive Surgery Breast and Colorectal Surgery   Addendum: I logged onto the [facility] website and reviewed her prescription medication history  06/22/2017 11:43 AM

## 2017-06-22 NOTE — Progress Notes (Signed)
Assisted Dr. Rob Fitzgerald with right, ultrasound guided, pectoralis block. Side rails up, monitors on throughout procedure. See vital signs in flow sheet. Tolerated Procedure well. 

## 2017-06-22 NOTE — Interval H&P Note (Signed)
History and Physical Interval Note:  06/22/2017 8:58 AM  Angela Buchanan  has presented today for surgery, with the diagnosis of RIGHT BREAST CANCER, LEFT BREAST COMPLEX SCLEROSING LESION  The various methods of treatment have been discussed with the patient and family. After consideration of risks, benefits and other options for treatment, the patient has consented to  Procedure(s): RIGHT BREAST LUMPECTOMY WITH RADIOACTIVE SEED AND RIGHT AXILLARY DEEP SENTINEL LYMPH NODE BIOPSY WITH BLUE DYE INJECTION (Right) LEFT BREAST LUMPECTOMY WITH RADIOACTIVE SEED LOCALIZATION (Left) as a surgical intervention .  The patient's history has been reviewed, patient examined, no change in status, stable for surgery.  I have reviewed the patient's chart and labs.  Questions were answered to the patient's satisfaction.     Adin Hector

## 2017-06-25 ENCOUNTER — Encounter (HOSPITAL_BASED_OUTPATIENT_CLINIC_OR_DEPARTMENT_OTHER): Payer: Self-pay | Admitting: General Surgery

## 2017-06-25 NOTE — Addendum Note (Signed)
Addendum  created 06/25/17 0910 by Tawni Millers, CRNA   Charge Capture section accepted

## 2017-07-02 ENCOUNTER — Ambulatory Visit (HOSPITAL_BASED_OUTPATIENT_CLINIC_OR_DEPARTMENT_OTHER): Payer: Medicare Other | Admitting: Hematology and Oncology

## 2017-07-02 ENCOUNTER — Telehealth: Payer: Self-pay | Admitting: *Deleted

## 2017-07-02 DIAGNOSIS — Z170 Estrogen receptor positive status [ER+]: Secondary | ICD-10-CM

## 2017-07-02 DIAGNOSIS — C50411 Malignant neoplasm of upper-outer quadrant of right female breast: Secondary | ICD-10-CM

## 2017-07-02 NOTE — Telephone Encounter (Signed)
Received order for oncotype testing. Requisition sent to pathology 

## 2017-07-02 NOTE — Assessment & Plan Note (Signed)
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 cm, 1/3 micrometastatic disease in lymph nodes ; Left lumpectomy: Complex sclerosing lesion; grade 2, ER +, PR 50%, HER-2 negative ratio 1.26, Ki-67 15%, T1 cN1 mic M0 stage IA AJCC 8  Pathology counseling: I discussed the final pathology report of the patient provided  a copy of this report. I discussed the margins as well as lymph node surgeries. We also discussed the final staging along with previously performed ER/PR and HER-2/neu testing.  Recommendation: 1. Oncotype DX testing to determine if chemotherapy would be of any benefit followed by 2. Adjuvant radiation therapy followed by 3. Adjuvant antiestrogen therapy   return to clinic based on Oncotype DX testing

## 2017-07-02 NOTE — Progress Notes (Signed)
 Patient Care Team: Webb, Carol, MD as PCP - General (Family Medicine) Ingram, Haywood, MD as Consulting Physician (General Surgery) Gudena, Vinay, MD as Consulting Physician (Hematology and Oncology) Moody, John, MD as Consulting Physician (Radiation Oncology)  DIAGNOSIS:  Encounter Diagnosis  Name Primary?  . Malignant neoplasm of upper-outer quadrant of right breast in female, estrogen receptor positive (HCC)     SUMMARY OF ONCOLOGIC HISTORY:   Malignant neoplasm of upper-outer quadrant of right breast in female, estrogen receptor positive (HCC)   05/16/2017 Initial Diagnosis    Left breast biopsy UIQ: Complex sclerosing lesion with calcifications and sclerosing adenosis, right breast biopsy 10:00: IDC grade 2, DCIS, ER/PR positive, HER-2 negative ratio 1.26,  11 mm lesion in the right breast at 10:00 position: T1b N0 stage IA clinical stage       06/22/2017 Surgery    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 cm, 1/3 micrometastatic disease in lymph nodes ; Left lumpectomy: Complex sclerosing lesion; grade 2, ER +, PR 50%, HER-2 negative ratio 1.26, Ki-67 15%, T1 cN1 mic M0 stage IA AJCC 8        CHIEF COMPLIANT: Follow-up after recent lumpectomy  INTERVAL HISTORY: Equilla B Summerhill is a 66-year-old with above-mentioned history of right breast cancer, who underwent lumpectomy and is here to discuss the pathology report. She tolerated surgery extremely well. She is healing very well from the recent surgery.  REVIEW OF SYSTEMS:   Constitutional: Denies fevers, chills or abnormal weight loss Eyes: Denies blurriness of vision Ears, nose, mouth, throat, and face: Denies mucositis or sore throat Respiratory: Denies cough, dyspnea or wheezes Cardiovascular: Denies palpitation, chest discomfort Gastrointestinal:  Denies nausea, heartburn or change in bowel habits Skin: Denies abnormal skin rashes Lymphatics: Denies new lymphadenopathy or easy bruising Neurological:Denies numbness, tingling  or new weaknesses Behavioral/Psych: Mood is stable, no new changes  Extremities: No lower extremity edema Breast:  denies any pain or lumps or nodules in either breasts All other systems were reviewed with the patient and are negative.  I have reviewed the past medical history, past surgical history, social history and family history with the patient and they are unchanged from previous note.  ALLERGIES:  is allergic to atenolol; bisoprolol; prednisone; and latex.  MEDICATIONS:  Current Outpatient Prescriptions  Medication Sig Dispense Refill  . aspirin 81 MG tablet Take 81 mg by mouth daily.    . b complex vitamins tablet Take 1 tablet by mouth daily.    . Calcium Citrate 250 MG TABS Take 250 mg by mouth 4 (four) times daily.    . cholecalciferol (VITAMIN D) 1000 units tablet Take 2,000 Units by mouth daily.    . citalopram (CELEXA) 10 MG tablet Take 10 mg by mouth daily as needed (anxiety).     . fluticasone (FLONASE) 50 MCG/ACT nasal spray     . HYDROcodone-acetaminophen (NORCO) 5-325 MG tablet Take 1-2 tablets by mouth every 6 (six) hours as needed for moderate pain or severe pain. 30 tablet 0  . lisinopril (PRINIVIL,ZESTRIL) 20 MG tablet Take 1 tablet (20 mg total) by mouth daily. 90 tablet 1  . montelukast (SINGULAIR) 10 MG tablet Take 10 mg by mouth at bedtime.     . Multiple Vitamin (MULTIVITAMIN WITH MINERALS) TABS tablet Take 1 tablet by mouth daily.     No current facility-administered medications for this visit.     PHYSICAL EXAMINATION: ECOG PERFORMANCE STATUS: 1 - Symptomatic but completely ambulatory  Vitals:   07/02/17 1459  BP: (!)   148/58  Pulse: (!) 55  Resp: 18  Temp: 98 F (36.7 C)  SpO2: 100%   Filed Weights   07/02/17 1459  Weight: 115 lb 3.2 oz (52.3 kg)    GENERAL:alert, no distress and comfortable SKIN: skin color, texture, turgor are normal, no rashes or significant lesions EYES: normal, Conjunctiva are pink and non-injected, sclera  clear OROPHARYNX:no exudate, no erythema and lips, buccal mucosa, and tongue normal  NECK: supple, thyroid normal size, non-tender, without nodularity LYMPH:  no palpable lymphadenopathy in the cervical, axillary or inguinal LUNGS: clear to auscultation and percussion with normal breathing effort HEART: regular rate & rhythm and no murmurs and no lower extremity edema ABDOMEN:abdomen soft, non-tender and normal bowel sounds MUSCULOSKELETAL:no cyanosis of digits and no clubbing  NEURO: alert & oriented x 3 with fluent speech, no focal motor/sensory deficits EXTREMITIES: No lower extremity edema BREAST: No palpable masses or nodules in either right or left breasts. No palpable axillary supraclavicular or infraclavicular adenopathy no breast tenderness or nipple discharge. (exam performed in the presence of a chaperone)  LABORATORY DATA:  I have reviewed the data as listed   Chemistry      Component Value Date/Time   NA 138 05/23/2017 1209   K 3.8 05/23/2017 1209   CL 103 10/20/2011 2104   CO2 25 05/23/2017 1209   BUN 11.9 05/23/2017 1209   CREATININE 0.8 05/23/2017 1209      Component Value Date/Time   CALCIUM 9.5 05/23/2017 1209   ALKPHOS 99 05/23/2017 1209   AST 20 05/23/2017 1209   ALT 12 05/23/2017 1209   BILITOT 0.50 05/23/2017 1209       Lab Results  Component Value Date   WBC 5.1 05/23/2017   HGB 12.5 05/23/2017   HCT 36.6 05/23/2017   MCV 97.5 05/23/2017   PLT 433 (H) 05/23/2017   NEUTROABS 2.7 05/23/2017    ASSESSMENT & PLAN:  Malignant neoplasm of upper-outer quadrant of right breast in female, estrogen receptor positive ([place]) 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 cm, 1/3 micrometastatic disease in lymph nodes ; Left lumpectomy: Complex sclerosing lesion; grade 2, ER +, PR 50%, HER-2 negative ratio 1.26, Ki-67 15%, T1 cN1 mic M0 stage IA AJCC 8  Pathology counseling: I discussed the final pathology report of the patient provided  a copy of this report. I  discussed the margins as well as lymph node surgeries. We also discussed the final staging along with previously performed ER/PR and HER-2/neu testing.  Recommendation: 1. Oncotype DX testing to determine if chemotherapy would be of any benefit followed by 2. Adjuvant radiation therapy followed by 3. Adjuvant antiestrogen therapy   return to clinic based on Oncotype DX testing     I spent 25 minutes talking to the patient of which more than half was spent in counseling and coordination of care.  No orders of the defined types were placed in this encounter.  The patient has a good understanding of the overall plan. she agrees with it. she will call with any problems that may develop before the next visit here.   Rulon Eisenmenger, MD 07/02/17

## 2017-07-17 ENCOUNTER — Telehealth: Payer: Self-pay | Admitting: *Deleted

## 2017-07-17 NOTE — Telephone Encounter (Signed)
Received oncotype results of 27.  Spoke with patient and informed her.  She will come in 07/19/17 at 3pm to discuss with Dr. Lindi Adie.

## 2017-07-18 ENCOUNTER — Encounter: Payer: Self-pay | Admitting: Hematology and Oncology

## 2017-07-19 ENCOUNTER — Ambulatory Visit (HOSPITAL_BASED_OUTPATIENT_CLINIC_OR_DEPARTMENT_OTHER): Payer: Medicare Other | Admitting: Hematology and Oncology

## 2017-07-19 DIAGNOSIS — Z170 Estrogen receptor positive status [ER+]: Secondary | ICD-10-CM | POA: Diagnosis not present

## 2017-07-19 DIAGNOSIS — C50411 Malignant neoplasm of upper-outer quadrant of right female breast: Secondary | ICD-10-CM

## 2017-07-19 NOTE — Assessment & Plan Note (Signed)
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 cm, 1/3 micrometastatic disease in lymph nodes ; Left lumpectomy: Complex sclerosing lesion; grade 2, ER +, PR 50%, HER-2 negative ratio 1.26, Ki-67 15%, T1 cN1 mic M0 stage IA AJCC 8  Recommendation: 1. Oncotype DX testing to determine if chemotherapy would be of any benefit followed by 2. Adjuvant radiation therapy followed by 3. Adjuvant antiestrogen therapy  ----------------------------------------------------------------------------------------------------------------------- Oncotype DX score 27: Intermediate risk, 10 year risk of distant recurrence with tamoxifen alone 12% and chemotherapy plus tamoxifen 8%  I discussed with the patient and there is about a 4% decrease in recurrence rate with chemotherapy. I discussed the pros and cons of adjuvant chemotherapy with Taxotere and Cytoxan every 3 weeks 4 cycles. Interestingly the Oncotype DX suggested that the progesterone receptors negative, that explains the intermediate risk prognostic score.   

## 2017-07-19 NOTE — Progress Notes (Signed)
Patient Care Team: [name], MD as PCP - General (Family Medicine) Fanny Skates, MD as Consulting Physician (General Surgery) Nicholas Lose, MD as Consulting Physician (Hematology and Oncology) Kyung Rudd, MD as Consulting Physician (Radiation Oncology)  DIAGNOSIS:  Encounter Diagnosis  Name Primary?  . Malignant neoplasm of upper-outer quadrant of right breast in female, estrogen receptor positive ([place])     SUMMARY OF ONCOLOGIC HISTORY:   Malignant neoplasm of upper-outer quadrant of right breast in female, estrogen receptor positive ([place])   05/16/2017 Initial Diagnosis    Left breast biopsy UIQ: Complex sclerosing lesion with calcifications and sclerosing adenosis, right breast biopsy 10:00: IDC grade 2, DCIS, ER/PR positive, HER-2 negative ratio 1.26,  11 mm lesion in the right breast at 10:00 position: T1b N0 stage IA clinical stage       06/22/2017 Surgery    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 cm, 1/3 micrometastatic disease in lymph nodes ; Left lumpectomy: Complex sclerosing lesion; grade 2, ER +, PR 50%, HER-2 negative ratio 1.26, Ki-67 15%, T1 cN1 mic M0 stage IA AJCC 8        CHIEF COMPLIANT: patient is here to discuss Oncotype test result  INTERVAL HISTORY: Angela Buchanan is a 66 year old with above-mentioned is right breast cancer treated with lumpectomy and had Oncotype DX testing done and is here today to discuss results. Oncotype DX came back as intermediate risk with a score of 27. She is here recommended by her husband to discuss the pros and cons of systemic chemotherapy.  REVIEW OF SYSTEMS:   Constitutional: Denies fevers, chills or abnormal weight loss Eyes: Denies blurriness of vision Ears, nose, mouth, throat, and face: Denies mucositis or sore throat Respiratory: Denies cough, dyspnea or wheezes Cardiovascular: Denies palpitation, chest discomfort Gastrointestinal:  Denies nausea, heartburn or change in bowel habits Skin: Denies abnormal skin  rashes Lymphatics: Denies new lymphadenopathy or easy bruising Neurological:Denies numbness, tingling or new weaknesses Behavioral/Psych: Mood is stable, no new changes  Extremities: No lower extremity edema Breast:  denies any pain or lumps or nodules in either breasts All other systems were reviewed with the patient and are negative.  I have reviewed the past medical history, past surgical history, social history and family history with the patient and they are unchanged from previous note.  ALLERGIES:  is allergic to atenolol; bisoprolol; prednisone; and latex.  MEDICATIONS:  Current Outpatient Prescriptions  Medication Sig Dispense Refill  . aspirin 81 MG tablet Take 81 mg by mouth daily.    Marland Kitchen b complex vitamins tablet Take 1 tablet by mouth daily.    . Calcium Citrate 250 MG TABS Take 250 mg by mouth 4 (four) times daily.    . cholecalciferol (VITAMIN D) 1000 units tablet Take 2,000 Units by mouth daily.    . citalopram (CELEXA) 10 MG tablet Take 10 mg by mouth daily as needed (anxiety).     . fluticasone (FLONASE) 50 MCG/ACT nasal spray     . HYDROcodone-acetaminophen (NORCO) 5-325 MG tablet Take 1-2 tablets by mouth every 6 (six) hours as needed for moderate pain or severe pain. 30 tablet 0  . lisinopril (PRINIVIL,ZESTRIL) 20 MG tablet Take 1 tablet (20 mg total) by mouth daily. 90 tablet 1  . montelukast (SINGULAIR) 10 MG tablet Take 10 mg by mouth at bedtime.     . Multiple Vitamin (MULTIVITAMIN WITH MINERALS) TABS tablet Take 1 tablet by mouth daily.     No current facility-administered medications for this visit.  PHYSICAL EXAMINATION: ECOG PERFORMANCE STATUS: 0 - Asymptomatic  Vitals:   07/19/17 1455  BP: (!) 144/51  Pulse: 60  Resp: 18  Temp: 98 F (36.7 C)  SpO2: 100%   Filed Weights   07/19/17 1455  Weight: 117 lb 8 oz (53.3 kg)    GENERAL:alert, no distress and comfortable SKIN: skin color, texture, turgor are normal, no rashes or significant  lesions EYES: normal, Conjunctiva are pink and non-injected, sclera clear OROPHARYNX:no exudate, no erythema and lips, buccal mucosa, and tongue normal  NECK: supple, thyroid normal size, non-tender, without nodularity LYMPH:  no palpable lymphadenopathy in the cervical, axillary or inguinal LUNGS: clear to auscultation and percussion with normal breathing effort HEART: regular rate & rhythm and no murmurs and no lower extremity edema ABDOMEN:abdomen soft, non-tender and normal bowel sounds MUSCULOSKELETAL:no cyanosis of digits and no clubbing  NEURO: alert & oriented x 3 with fluent speech, no focal motor/sensory deficits EXTREMITIES: No lower extremity edema  LABORATORY DATA:  I have reviewed the data as listed   Chemistry      Component Value Date/Time   NA 138 05/23/2017 1209   K 3.8 05/23/2017 1209   CL 103 10/20/2011 2104   CO2 25 05/23/2017 1209   BUN 11.9 05/23/2017 1209   CREATININE 0.8 05/23/2017 1209      Component Value Date/Time   CALCIUM 9.5 05/23/2017 1209   ALKPHOS 99 05/23/2017 1209   AST 20 05/23/2017 1209   ALT 12 05/23/2017 1209   BILITOT 0.50 05/23/2017 1209       Lab Results  Component Value Date   WBC 5.1 05/23/2017   HGB 12.5 05/23/2017   HCT 36.6 05/23/2017   MCV 97.5 05/23/2017   PLT 433 (H) 05/23/2017   NEUTROABS 2.7 05/23/2017    ASSESSMENT & PLAN:  Malignant neoplasm of upper-outer quadrant of right breast in female, estrogen receptor positive ([facility]) 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 cm, 1/3 micrometastatic disease in lymph nodes ; Left lumpectomy: Complex sclerosing lesion; grade 2, ER +, PR 50%, HER-2 negative ratio 1.26, Ki-67 15%, T1 cN1 mic M0 stage IA AJCC 8  Recommendation: 1. Oncotype DX testing to determine if chemotherapy would be of any benefit followed by 2. Adjuvant radiation therapy followed by 3. Adjuvant antiestrogen therapy   ----------------------------------------------------------------------------------------------------------------------- Oncotype DX score 27: Intermediate risk, 10 year risk of distant recurrence with tamoxifen alone 12% and chemotherapy plus tamoxifen 8%  I discussed with the patient and there is about a 4% decrease in recurrence rate with chemotherapy. I discussed the pros and cons of adjuvant chemotherapy with Taxotere and Cytoxan every 3 weeks 4 cycles. Interestingly the Oncotype DX suggested that the progesterone receptors negative, that explains the intermediate risk prognostic score.  Patient will inform us next Monday about her decision. We may be able to treat her with a peripheral IV. She understands the risks of peripheral intravenous chemotherapy.  I spent 25 minutes talking to the patient of which more than half was spent in counseling and coordination of care.  No orders of the defined types were placed in this encounter.  The patient has a good understanding of the overall plan. she agrees with it. she will call with any problems that may develop before the next visit here.   Rulon Eisenmenger, MD 07/19/17

## 2017-07-23 ENCOUNTER — Telehealth: Payer: Self-pay | Admitting: *Deleted

## 2017-07-23 ENCOUNTER — Other Ambulatory Visit: Payer: Self-pay | Admitting: *Deleted

## 2017-07-23 ENCOUNTER — Encounter: Payer: Self-pay | Admitting: Radiation Oncology

## 2017-07-23 DIAGNOSIS — Z170 Estrogen receptor positive status [ER+]: Secondary | ICD-10-CM

## 2017-07-23 DIAGNOSIS — C50411 Malignant neoplasm of upper-outer quadrant of right female breast: Secondary | ICD-10-CM

## 2017-07-23 NOTE — Telephone Encounter (Signed)
Spoke with patient and she has decided not to pursue chemotherapy.  I have referred her to Dr. Lisbeth Renshaw for XRT and Dr. Lindi Adie will see after XRT complete/

## 2017-07-25 NOTE — Progress Notes (Signed)
Location of Breast Cancer: upper-outer quadrant of right breast in female  Histology per Pathology Report:   FINAL DIAGNOSIS Diagnosis  06/22/2017 1. Breast, lumpectomy, Left w/seed - COMPLEX SCLEROSING LESION WITH CALCIFICATIONS AND SCLEROSING ADENOSIS. - FIBROCYSTIC CHANGES WITH CALCIFICATIONS AND SCLEROSING ADENOSIS. - PREVIOUS BIOPSY SITE. - NO EVIDENCE OF MALIGNANCY. 2. Breast, lumpectomy, Right w/seed - INVASIVE AND IN SITU DUCTAL CARCINOMA, 1.5 CM. - INVASIVE CARCINOMA FOCALLY 0.1 CM FROM ANTERIOR MARGIN. - FIBROCYSTIC CHANGES WITH CALCIFICATIONS AND USUAL DUCTAL HYPERPLASIA. - PREVIOUS BIOPSY SITE. 3. Lymph node, sentinel, biopsy, Right Axillary #1 - MICROSCOPIC FOCUS OF METASTATIC CARCINOMA (0.1 CM) IN ONE LYMPH NODE. 4. Lymph node, sentinel, biopsy, Right Axillary #2 - ONE BENIGN LYMPH NODE (0/1). 5. Lymph node, sentinel, biopsy, Right Axillary #3 - ONE BENIGN LYMPH NODE (0/1).  Receptor Status: ER(100% +), PR (15% +), Her2-neu (+), Ki-(15%)   Diagnosis   05-16-17 1. Breast, left, needle core biopsy, UIQ - COMPLEX SCLEROSING LESION WITH CALCIFICATIONS AND SCLEROSING ADENOSIS. - FIBROCYSTIC CHANGES. 2. Breast, right, needle core biopsy, 10:00 o'clock - INVASIVE DUCTAL CARCINOMA, GRADE 2. - DUCTAL CARCINOMA IN SITU.  Receptor Status: ER(100%), PR (15%), Her2-neu (NEG), Ki-(15%)  Did patient present with symptoms (if so, please note symptoms) or was this found on screening mammography?: Patient had a routine screening mammogram that detected a mass at 10:00 position measuring 11 mm in size.  Past/Anticipated interventions by surgeon, if any:Dr.  Fanny Skates 06/22/2017  Left breast lumpectomy with radioactive seed localization and margin assessment                                      Inject blue dye right breast                                      Right breast lumpectomy with radioactive seed localization and margin assessment                                       Right axillary deep sentinel lymph node mapping and biopsy  Past/Anticipated interventions by medical oncology, if any: 07/19/2017  Dr Lindi Adie Adjuvant radiation therapy, Adjuvant radiation therapy,07/04/2017 Oncotype DX score 27: Intermediate risk,   Lymphedema issues, if any:  No  Pain issues, if any: No  SAFETY ISSUES:  Prior radiation? No  Pacemaker/ICD? No  Possible current pregnancy? No  Is the patient on methotrexate?  No  Menarche 11  G0 P0    BC 14 years   Menopause 38 had hysterectomy    HRT Yes Current Complaints / other details:  She is married. No children.   05/23/2017  Dr. Lisbeth Renshaw discusses the delivery and logistics of radiotherapy and would anticipate a course of 4 weeks of treatment. We will see her back about 2 weeks after surgery to move forward with the simulation and planning process and anticipate starting radiotherapy about 4 weeks after surgery.  Wt Readings from Last 3 Encounters:  07/31/17 116 lb 12.8 oz (53 kg)  07/19/17 117 lb 8 oz (53.3 kg)  07/02/17 115 lb 3.2 oz (52.3 kg)  BP (!) 168/94   Pulse 75   Temp 97.8 F (36.6 C) (Oral)   Resp 18   Ht '5\' 2"'  (1.575 m)  Wt 116 lb 12.8 oz (53 kg)   LMP  (LMP Unknown)   SpO2 98%   BMI 21.36 kg/m  Mardene Sayer, LPN 63/81/7711,6:57 AM

## 2017-07-31 ENCOUNTER — Ambulatory Visit
Admission: RE | Admit: 2017-07-31 | Discharge: 2017-07-31 | Disposition: A | Discharge status: Home / Self Care | Payer: Medicare Other | Source: Ambulatory Visit | Attending: Radiation Oncology | Admitting: Radiation Oncology

## 2017-07-31 ENCOUNTER — Encounter: Payer: Self-pay | Admitting: Radiation Oncology

## 2017-07-31 VITALS — BP 168/94 | HR 75 | Temp 97.8°F | Resp 18 | Ht 62.0 in | Wt 116.8 lb

## 2017-07-31 DIAGNOSIS — Z510 Encounter for antineoplastic radiation therapy: Secondary | ICD-10-CM | POA: Diagnosis present

## 2017-07-31 DIAGNOSIS — Z8249 Family history of ischemic heart disease and other diseases of the circulatory system: Secondary | ICD-10-CM | POA: Diagnosis not present

## 2017-07-31 DIAGNOSIS — N6489 Other specified disorders of breast: Secondary | ICD-10-CM | POA: Diagnosis not present

## 2017-07-31 DIAGNOSIS — Z7951 Long term (current) use of inhaled steroids: Secondary | ICD-10-CM | POA: Diagnosis not present

## 2017-07-31 DIAGNOSIS — Z170 Estrogen receptor positive status [ER+]: Secondary | ICD-10-CM

## 2017-07-31 DIAGNOSIS — Z961 Presence of intraocular lens: Secondary | ICD-10-CM | POA: Diagnosis not present

## 2017-07-31 DIAGNOSIS — Z7982 Long term (current) use of aspirin: Secondary | ICD-10-CM | POA: Diagnosis not present

## 2017-07-31 DIAGNOSIS — Z90710 Acquired absence of both cervix and uterus: Secondary | ICD-10-CM | POA: Diagnosis not present

## 2017-07-31 DIAGNOSIS — Z79899 Other long term (current) drug therapy: Secondary | ICD-10-CM | POA: Diagnosis not present

## 2017-07-31 DIAGNOSIS — Z823 Family history of stroke: Secondary | ICD-10-CM | POA: Diagnosis not present

## 2017-07-31 DIAGNOSIS — C50411 Malignant neoplasm of upper-outer quadrant of right female breast: Secondary | ICD-10-CM

## 2017-07-31 DIAGNOSIS — Z9842 Cataract extraction status, left eye: Secondary | ICD-10-CM | POA: Diagnosis not present

## 2017-07-31 DIAGNOSIS — Z888 Allergy status to other drugs, medicaments and biological substances status: Secondary | ICD-10-CM | POA: Diagnosis not present

## 2017-07-31 DIAGNOSIS — Z9841 Cataract extraction status, right eye: Secondary | ICD-10-CM | POA: Diagnosis not present

## 2017-07-31 DIAGNOSIS — I10 Essential (primary) hypertension: Secondary | ICD-10-CM | POA: Diagnosis not present

## 2017-07-31 NOTE — Progress Notes (Signed)
Radiation Oncology         [phone] ________________________________  Name: Angela Buchanan        MRN: [id number]  Date of Service: 07/31/2017 DOB: Jan 15, 1951  SE:GBTD, Angela Cookey, MD  Angela Lose, MD     REFERRING PHYSICIAN: Nicholas Lose, MD   DIAGNOSIS: The encounter diagnosis was Malignant neoplasm of upper-outer quadrant of right breast in female, estrogen receptor positive ([place]).   HISTORY OF PRESENT ILLNESS: Angela Buchanan is a 66 y.o. female seen in the multidisciplinary breast clinic for a new diagnosis of right breast cancer. The patient was found to have a possible mass of the right breast and a distortion in the left breast. Diagnostic imaging on 05/01/17 revealed a 1.1 x .8 x 1.0 cm mass in the right breast at 10:00, and the axilla was negative for adenopathy. 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 She was noted to have a palpable nodule in the neck and underwent CT of the neck on 05/25/2017 which was negative for adenopathy. She subsequently proceeded with surgery on 06/22/2017 with bilateral lumpectomy. The left reveals a complex sclerosing lesion which is negative for malignancy. Her right lumpectomy revealed a 1.5 cm invasive ductal carcinoma with DCIS, grade 2. 1 of the 3 stable lymph nodes reveals micro-metastasis. She had an Oncotype performed and this was 27. She was offered chemotherapy but elected to forego this. She comes today to discuss starting adjuvant therapy with radiation. She is planning to take antiestrogen therapy as well  PREVIOUS RADIATION THERAPY: No   PAST MEDICAL HISTORY:  Past Medical History:  Diagnosis Date  . Bigeminy    no current med.  . Breast cancer ([place]) 06/2017   right  . Bruises easily   . Dental crowns present   . History of diverticulitis   .  Hypertension    states under control with med., has been on med. x 5 yr.  . 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 Seasonal allergies        PAST SURGICAL HISTORY: Past Surgical History:  Procedure Laterality Date  . ABDOMINAL HYSTERECTOMY     partial  . BREAST LUMPECTOMY WITH RADIOACTIVE SEED AND SENTINEL LYMPH NODE BIOPSY Right 06/22/2017   Procedure: RIGHT BREAST LUMPECTOMY WITH RADIOACTIVE SEED AND RIGHT AXILLARY DEEP SENTINEL LYMPH NODE BIOPSY WITH BLUE DYE INJECTION;  Surgeon: Fanny Skates, MD;  Location: [facility];  Service: General;  Laterality: Right;  . BREAST LUMPECTOMY WITH RADIOACTIVE SEED LOCALIZATION Left 06/22/2017   Procedure: LEFT BREAST LUMPECTOMY WITH RADIOACTIVE SEED LOCALIZATION;  Surgeon: Fanny Skates, MD;  Location: [facility];  Service: General;  Laterality: Left;  . CATARACT EXTRACTION W/ INTRAOCULAR LENS  IMPLANT, BILATERAL Bilateral   . TONSILLECTOMY     age 25     FAMILY HISTORY:  Family History  Problem Relation Age of Onset  . Hypertension Sister   . Dementia Father   . Stroke Sister      SOCIAL HISTORY:  reports that she has never smoked. She has never used smokeless tobacco. She reports that she does not drink alcohol or use drugs. The patient is married and resides in [place]. She is accompanied by her husband.   ALLERGIES: Atenolol; Bisoprolol; Prednisone; and Latex   MEDICATIONS:  Current Outpatient Prescriptions  Medication Sig Dispense Refill  . aspirin 81 MG tablet Take 81 mg by mouth daily.    Marland Kitchen b complex vitamins tablet Take 1 tablet by mouth daily.    . Calcium Citrate 250 MG TABS Take 250 mg by mouth 4 (four) times daily.    . cholecalciferol (VITAMIN D) 1000 units tablet Take 2,000 Units by mouth daily.    . citalopram (CELEXA) 10 MG tablet Take 10 mg by mouth daily as needed (anxiety).     Marland Kitchen lisinopril (PRINIVIL,ZESTRIL) 20 MG tablet Take 1 tablet (20 mg total) by mouth daily. 90  tablet 1  . montelukast (SINGULAIR) 10 MG tablet Take 10 mg by mouth at bedtime.     . Multiple Vitamin (MULTIVITAMIN WITH MINERALS) TABS tablet Take 1 tablet by mouth daily.    . fluticasone (FLONASE) 50 MCG/ACT nasal spray     . HYDROcodone-acetaminophen (NORCO) 5-325 MG tablet Take 1-2 tablets by mouth every 6 (six) hours as needed for moderate pain or severe pain. (Patient not taking: Reported on 07/31/2017) 30 tablet 0   No current facility-administered medications for this encounter.      REVIEW OF SYSTEMS: On review of systems, the patient reports that she is doing well overall. She denies any chest pain, shortness of breath, cough, fevers, chills, night sweats, unintended weight changes. She denies any bowel or bladder disturbances, and denies abdominal pain, nausea or vomiting. She denies any new musculoskeletal or joint aches or pains. A complete review of systems is obtained and is otherwise negative.     PHYSICAL EXAM:  Wt Readings from Last 3 Encounters:  07/31/17 116 lb 12.8 oz (53 kg)  07/19/17 117 lb 8 oz (53.3 kg)  07/02/17 115 lb 3.2 oz (52.3 kg)   Temp Readings from Last 3 Encounters:  07/31/17 97.8 F (36.6 C) (Oral)  07/19/17 98 F (36.7 C) (Oral)  07/02/17 98 F (36.7 C) (Oral)   BP Readings from Last 3 Encounters:  07/31/17 (!) 168/94  07/19/17 (!) 144/51  07/02/17 (!) 148/58   Pulse Readings from Last 3 Encounters:  07/31/17 75  07/19/17 60  07/02/17 (!) 55   Pain Assessment Pain Score: 0-No pain (left ankle for b/p)  In general this is a well appearing caucasian female in no acute distress. She's alert and oriented x4 and appropriate throughout the examination. Cardiopulmonary assessment is negative for acute distress and she exhibits normal effort. The right breast is evaluated in a well-healed lumpectomy scar as well as rectal well-healed axillary incision are noted. No evidence of cellulitic streaking bleeding or drainage is noted.    ECOG =  1  0 - Asymptomatic (Fully active, able to carry on all predisease activities without restriction)  1 - Symptomatic but completely ambulatory (Restricted in physically strenuous activity but ambulatory and able to carry out work of a light or sedentary nature. For example, light housework, office work)  2 - Symptomatic, <50% in bed during the day (Ambulatory and capable of all self care but unable to carry out any work activities. Up and about more than 50% of waking hours)  3 - Symptomatic, >50% in bed, but not bedbound (Capable of only limited self-care, confined to bed or chair 50% or more of waking hours)  4 - Bedbound (Completely disabled. Cannot carry on any self-care. Totally confined to bed or chair)  5 - Death   Eustace Pen MM, Creech RH, Tormey DC, et al. [phone]). "Toxicity and response criteria of the [facility]  Group". [place] Oncol. 5 (6): 649-55    LABORATORY DATA:  Lab Results  Component Value Date   WBC 5.1 05/23/2017   HGB 12.5 05/23/2017   HCT 36.6 05/23/2017   MCV 97.5 05/23/2017   PLT 433 (H) 05/23/2017   Lab Results  Component Value Date   NA 138 05/23/2017   K 3.8 05/23/2017   CL 103 10/20/2011   CO2 25 05/23/2017   Lab Results  Component Value Date   ALT 12 05/23/2017   AST 20 05/23/2017   ALKPHOS 99 05/23/2017   BILITOT 0.50 05/23/2017      RADIOGRAPHY: No results found.     IMPRESSION/PLAN: 1. 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 The patient's pathology was reviewed and we discussed the findings from her Oncotype testing. She is not complaining of chemotherapy and at this point would be ready to move forward with adjuvant radiotherapy. Although initially we thought that she would be a candidate for 4 weeks, given the  Micrometastasis,Dr. Lisbeth Renshaw would recommend 6 1/2 weeks to the right breast and regional lymph nodes. We discussed the risks, benefits, short, and long term  effects of radiotherapy, and the patient is interested in proceeding. Written consent is obtained and placed in the chart, a copy was provided to the patient. She will return for simulation tomorrow.  In a visit lasting 25 minutes, greater than 50% of the time was spent face to face discussing her pathology, and coordinating the patient's care.   The above documentation reflects my direct findings during this shared patient visit. Please see the separate note by Dr. Lisbeth Renshaw on this date for the remainder of the patient's plan of care.    Carola Rhine, PAC

## 2017-08-01 ENCOUNTER — Ambulatory Visit
Admission: RE | Admit: 2017-08-01 | Discharge: 2017-08-01 | Disposition: A | Discharge status: Home / Self Care | Payer: Medicare Other | Source: Ambulatory Visit | Attending: Radiation Oncology | Admitting: Radiation Oncology

## 2017-08-01 DIAGNOSIS — C50411 Malignant neoplasm of upper-outer quadrant of right female breast: Secondary | ICD-10-CM

## 2017-08-01 DIAGNOSIS — Z170 Estrogen receptor positive status [ER+]: Secondary | ICD-10-CM

## 2017-08-01 DIAGNOSIS — Z510 Encounter for antineoplastic radiation therapy: Secondary | ICD-10-CM | POA: Diagnosis not present

## 2017-08-02 NOTE — Progress Notes (Signed)
  Radiation Oncology         [phone] ________________________________  Name: Angela Buchanan MRN: [id number]  Date: 08/01/2017  DOB: 1951-01-29  Optical Surface Tracking Plan:  Since intensity modulated radiotherapy (IMRT) and 3D conformal radiation treatment methods are predicated on accurate and precise positioning for treatment, intrafraction motion monitoring is medically necessary to ensure accurate and safe treatment delivery.  The ability to quantify intrafraction motion without excessive ionizing radiation dose can only be performed with optical surface tracking. Accordingly, surface imaging offers the opportunity to obtain 3D measurements of patient position throughout IMRT and 3D treatments without excessive radiation exposure.  I am ordering optical surface tracking for this patient's upcoming course of radiotherapy. ________________________________  Kyung Rudd, MD 08/02/2017 6:49 PM    Reference:   Particia Jasper, et al. Surface imaging-based analysis of intrafraction motion for breast radiotherapy patients.Journal of [place], n. 6, nov. 2014. ISSN [id number].   Available at: <http://www.jacmp.org/index.php/jacmp/article/view/4957>.

## 2017-08-02 NOTE — Progress Notes (Signed)
  Radiation Oncology         [phone] ________________________________  Name: Angela Buchanan MRN: [id number]  Date: 08/01/2017  DOB: [birth year]  DIAGNOSIS:     ICD-10-CM   1. Malignant neoplasm of upper-outer quadrant of right breast in female, estrogen receptor positive ([place]) C50.411    Z17.0      SIMULATION AND TREATMENT PLANNING NOTE  The patient presented for simulation prior to beginning her course of radiation treatment for her diagnosis of right-sided breast cancer. The patient was placed in a supine position on a breast board. A customized vac-lock bag was also constructed and this complex treatment device will be used on a daily basis during her treatment. In this fashion, a CT scan was obtained through the chest area and an isocenter was placed near the chest wall at the upper aspect of the right chest.  The patient will be planned to receive a course of radiation initially to a dose of 50.4 gray. This will consist of a 4 field technique targeting the right chest wall as well as the supraclavicular region. Therefore 2 customized medial and lateral tangent fields have been created targeting the right breast, and also 2 additional customized fields have been designed to treat the supraclavicular region both with a right supraclavicular field and a right posterior axillary boost field. A forward planning/reduced field technique will also be evaluated to determine if this significantly improves the dose homogeneity of the overall plan. Therefore, additional customized blocks/fields may be necessary.  This initial treatment will be accomplished at 1.8 gray per fraction.   The initial plan will consist of a 3-D conformal technique. The target volume/scar, heart and lungs have been contoured and dose volume histograms of each of these structures will be evaluated as part of the 3-D conformal treatment planning process.   It is anticipated that the patient will then receive a 10 gray boost  to the surgical scar. This will be accomplished at 2 gray per fraction. The final anticipated total dose therefore will correspond to 60.4 gray.    _______________________________   Jodelle Gross, MD, PhD

## 2017-08-04 ENCOUNTER — Telehealth: Payer: Self-pay

## 2017-08-04 NOTE — Telephone Encounter (Signed)
Called and left a message with appt per inbasket

## 2017-08-07 DIAGNOSIS — Z510 Encounter for antineoplastic radiation therapy: Secondary | ICD-10-CM | POA: Diagnosis not present

## 2017-08-08 ENCOUNTER — Ambulatory Visit
Admission: RE | Admit: 2017-08-08 | Discharge: 2017-08-08 | Disposition: A | Discharge status: Home / Self Care | Payer: Medicare Other | Source: Ambulatory Visit | Attending: Radiation Oncology | Admitting: Radiation Oncology

## 2017-08-08 DIAGNOSIS — Z510 Encounter for antineoplastic radiation therapy: Secondary | ICD-10-CM | POA: Diagnosis not present

## 2017-08-09 ENCOUNTER — Ambulatory Visit
Admission: RE | Admit: 2017-08-09 | Discharge: 2017-08-09 | Disposition: A | Discharge status: Home / Self Care | Payer: Medicare Other | Source: Ambulatory Visit | Attending: Radiation Oncology | Admitting: Radiation Oncology

## 2017-08-09 DIAGNOSIS — Z510 Encounter for antineoplastic radiation therapy: Secondary | ICD-10-CM | POA: Diagnosis not present

## 2017-08-09 DIAGNOSIS — C50411 Malignant neoplasm of upper-outer quadrant of right female breast: Secondary | ICD-10-CM

## 2017-08-09 DIAGNOSIS — Z170 Estrogen receptor positive status [ER+]: Secondary | ICD-10-CM

## 2017-08-09 MED ORDER — RADIAPLEXRX EX GEL
Freq: Once | CUTANEOUS | Status: AC
  Administered 2017-08-09: 14:00:00 via TOPICAL

## 2017-08-09 MED ORDER — ALRA NON-METALLIC DEODORANT (RAD-ONC)
1.0000 "application " | Freq: Once | TOPICAL | Status: AC
  Administered 2017-08-09: 1 via TOPICAL

## 2017-08-09 NOTE — Progress Notes (Signed)
Pt education done, my business card, alra deodorant,radiaplex gel, Radiation thrapy and you book given, discussed skin irritation pain,fatigue, apply radiaplex to breast after rad tx and bedtimes, to moisturize and soothe, , alra after rad tx and prn, luke warm shower/barh,no rubbing,scrubbing, or scratching treated breast, pat dry,, no shaving axilla unless electric shaver,, nounder wire bras,  unscented soap, mild,  increase protein in diet,sty hydrated, exercise, and get plenty rest/sleep, Md sees patient weekly and prn, 1:44 PM

## 2017-08-10 ENCOUNTER — Ambulatory Visit
Admission: RE | Admit: 2017-08-10 | Discharge: 2017-08-10 | Disposition: A | Discharge status: Home / Self Care | Payer: Medicare Other | Source: Ambulatory Visit | Attending: Radiation Oncology | Admitting: Radiation Oncology

## 2017-08-10 DIAGNOSIS — Z510 Encounter for antineoplastic radiation therapy: Secondary | ICD-10-CM | POA: Diagnosis not present

## 2017-08-13 ENCOUNTER — Ambulatory Visit
Admission: RE | Admit: 2017-08-13 | Discharge: 2017-08-13 | Disposition: A | Discharge status: Home / Self Care | Payer: Medicare Other | Source: Ambulatory Visit | Attending: Radiation Oncology | Admitting: Radiation Oncology

## 2017-08-13 DIAGNOSIS — Z510 Encounter for antineoplastic radiation therapy: Secondary | ICD-10-CM | POA: Diagnosis not present

## 2017-08-14 ENCOUNTER — Ambulatory Visit
Admission: RE | Admit: 2017-08-14 | Discharge: 2017-08-14 | Disposition: A | Discharge status: Home / Self Care | Payer: Medicare Other | Source: Ambulatory Visit | Attending: Radiation Oncology | Admitting: Radiation Oncology

## 2017-08-14 DIAGNOSIS — Z510 Encounter for antineoplastic radiation therapy: Secondary | ICD-10-CM | POA: Diagnosis not present

## 2017-08-15 ENCOUNTER — Ambulatory Visit
Admission: RE | Admit: 2017-08-15 | Discharge: 2017-08-15 | Disposition: A | Discharge status: Home / Self Care | Payer: Medicare Other | Source: Ambulatory Visit | Attending: Radiation Oncology | Admitting: Radiation Oncology

## 2017-08-15 DIAGNOSIS — Z510 Encounter for antineoplastic radiation therapy: Secondary | ICD-10-CM | POA: Diagnosis not present

## 2017-08-16 ENCOUNTER — Ambulatory Visit
Admission: RE | Admit: 2017-08-16 | Discharge: 2017-08-16 | Disposition: A | Discharge status: Home / Self Care | Payer: Medicare Other | Source: Ambulatory Visit | Attending: Radiation Oncology | Admitting: Radiation Oncology

## 2017-08-16 DIAGNOSIS — Z510 Encounter for antineoplastic radiation therapy: Secondary | ICD-10-CM | POA: Diagnosis not present

## 2017-08-17 ENCOUNTER — Ambulatory Visit
Admission: RE | Admit: 2017-08-17 | Discharge: 2017-08-17 | Disposition: A | Discharge status: Home / Self Care | Payer: Medicare Other | Source: Ambulatory Visit | Attending: Radiation Oncology | Admitting: Radiation Oncology

## 2017-08-17 DIAGNOSIS — Z510 Encounter for antineoplastic radiation therapy: Secondary | ICD-10-CM | POA: Diagnosis not present

## 2017-08-19 ENCOUNTER — Ambulatory Visit
Admission: RE | Admit: 2017-08-19 | Discharge: 2017-08-19 | Disposition: A | Discharge status: Home / Self Care | Payer: Medicare Other | Source: Ambulatory Visit | Attending: Radiation Oncology | Admitting: Radiation Oncology

## 2017-08-19 DIAGNOSIS — Z510 Encounter for antineoplastic radiation therapy: Secondary | ICD-10-CM | POA: Diagnosis not present

## 2017-08-20 ENCOUNTER — Ambulatory Visit
Admission: RE | Admit: 2017-08-20 | Discharge: 2017-08-20 | Disposition: A | Discharge status: Home / Self Care | Payer: Medicare Other | Source: Ambulatory Visit | Attending: Radiation Oncology | Admitting: Radiation Oncology

## 2017-08-20 DIAGNOSIS — Z510 Encounter for antineoplastic radiation therapy: Secondary | ICD-10-CM | POA: Diagnosis not present

## 2017-08-21 ENCOUNTER — Ambulatory Visit
Admission: RE | Admit: 2017-08-21 | Discharge: 2017-08-21 | Disposition: A | Discharge status: Home / Self Care | Payer: Medicare Other | Source: Ambulatory Visit | Attending: Radiation Oncology | Admitting: Radiation Oncology

## 2017-08-21 DIAGNOSIS — Z170 Estrogen receptor positive status [ER+]: Secondary | ICD-10-CM | POA: Insufficient documentation

## 2017-08-21 DIAGNOSIS — C50411 Malignant neoplasm of upper-outer quadrant of right female breast: Secondary | ICD-10-CM | POA: Insufficient documentation

## 2017-08-21 DIAGNOSIS — Z510 Encounter for antineoplastic radiation therapy: Secondary | ICD-10-CM | POA: Insufficient documentation

## 2017-08-22 ENCOUNTER — Ambulatory Visit
Admission: RE | Admit: 2017-08-22 | Discharge: 2017-08-22 | Disposition: A | Discharge status: Home / Self Care | Payer: Medicare Other | Source: Ambulatory Visit | Attending: Radiation Oncology | Admitting: Radiation Oncology

## 2017-08-22 DIAGNOSIS — Z510 Encounter for antineoplastic radiation therapy: Secondary | ICD-10-CM | POA: Diagnosis not present

## 2017-08-27 ENCOUNTER — Ambulatory Visit
Admission: RE | Admit: 2017-08-27 | Discharge: 2017-08-27 | Disposition: A | Discharge status: Home / Self Care | Payer: Medicare Other | Source: Ambulatory Visit | Attending: Radiation Oncology | Admitting: Radiation Oncology

## 2017-08-27 DIAGNOSIS — Z510 Encounter for antineoplastic radiation therapy: Secondary | ICD-10-CM | POA: Diagnosis not present

## 2017-08-28 ENCOUNTER — Ambulatory Visit
Admission: RE | Admit: 2017-08-28 | Discharge: 2017-08-28 | Disposition: A | Discharge status: Home / Self Care | Payer: Medicare Other | Source: Ambulatory Visit | Attending: Radiation Oncology | Admitting: Radiation Oncology

## 2017-08-28 DIAGNOSIS — Z510 Encounter for antineoplastic radiation therapy: Secondary | ICD-10-CM | POA: Diagnosis not present

## 2017-08-29 ENCOUNTER — Ambulatory Visit
Admission: RE | Admit: 2017-08-29 | Discharge: 2017-08-29 | Disposition: A | Discharge status: Home / Self Care | Payer: Medicare Other | Source: Ambulatory Visit | Attending: Radiation Oncology | Admitting: Radiation Oncology

## 2017-08-29 DIAGNOSIS — Z510 Encounter for antineoplastic radiation therapy: Secondary | ICD-10-CM | POA: Diagnosis not present

## 2017-08-30 ENCOUNTER — Ambulatory Visit
Admission: RE | Admit: 2017-08-30 | Discharge: 2017-08-30 | Disposition: A | Discharge status: Home / Self Care | Payer: Medicare Other | Source: Ambulatory Visit | Attending: Radiation Oncology | Admitting: Radiation Oncology

## 2017-08-30 ENCOUNTER — Encounter: Payer: Self-pay | Admitting: General Practice

## 2017-08-30 DIAGNOSIS — Z510 Encounter for antineoplastic radiation therapy: Secondary | ICD-10-CM | POA: Diagnosis not present

## 2017-08-30 NOTE — Progress Notes (Addendum)
[facility] [place] Psychosocial Distress Screening Clinical Social Work  Clinical Social Work was referred by distress screening protocol.  The patient scored a 5 on the Psychosocial Distress Thermometer which indicates moderate distress. Clinical Social Worker Edwyna Shell to assess for distress and other psychosocial needs. CSW spoke w husband who stated patient was in Radiation and could be contacted there or tomorrow AM, CSW will follow up.    ONCBCN DISTRESS SCREENING 07/31/2017  Screening Type Initial Screening  Distress experienced in past week (1-10) 5  Emotional problem type Nervousness/Anxiety;Adjusting to illness  Physical Problem type Sleep/insomnia  Physician notified of physical symptoms Yes    Clinical Social Worker follow up needed: Yes.    If yes, follow up plan:  10/31/16:  Spoke w patient, is halfway through treatments.  "Its been a lot to deal with, husband has also been diagnosed w cancer as well."  "We only have each other, we are private and don't share our information."  Patient is unsure what needs will be after husband has defined treatment plan.  Will speak w MD about possible needs for home health assistance after husband enters treatment, sent  resource packet including Road to Recovery in case transportation becomes an issue for patient and/or husband.      Edwyna Shell, LCSW Clinical Social Worker Phone:  [phone]

## 2017-08-31 ENCOUNTER — Ambulatory Visit
Admission: RE | Admit: 2017-08-31 | Discharge: 2017-08-31 | Disposition: A | Discharge status: Home / Self Care | Payer: Medicare Other | Source: Ambulatory Visit | Attending: Radiation Oncology | Admitting: Radiation Oncology

## 2017-08-31 DIAGNOSIS — Z510 Encounter for antineoplastic radiation therapy: Secondary | ICD-10-CM | POA: Diagnosis not present

## 2017-09-03 ENCOUNTER — Ambulatory Visit
Admission: RE | Admit: 2017-09-03 | Discharge: 2017-09-03 | Disposition: A | Discharge status: Home / Self Care | Payer: Medicare Other | Source: Ambulatory Visit | Attending: Radiation Oncology | Admitting: Radiation Oncology

## 2017-09-03 DIAGNOSIS — Z510 Encounter for antineoplastic radiation therapy: Secondary | ICD-10-CM | POA: Diagnosis not present

## 2017-09-04 ENCOUNTER — Ambulatory Visit
Admission: RE | Admit: 2017-09-04 | Discharge: 2017-09-04 | Disposition: A | Discharge status: Home / Self Care | Payer: Medicare Other | Source: Ambulatory Visit | Attending: Radiation Oncology | Admitting: Radiation Oncology

## 2017-09-04 DIAGNOSIS — Z510 Encounter for antineoplastic radiation therapy: Secondary | ICD-10-CM | POA: Diagnosis not present

## 2017-09-04 DIAGNOSIS — C50411 Malignant neoplasm of upper-outer quadrant of right female breast: Secondary | ICD-10-CM

## 2017-09-04 DIAGNOSIS — Z170 Estrogen receptor positive status [ER+]: Secondary | ICD-10-CM

## 2017-09-04 MED ORDER — RADIAPLEXRX EX GEL
Freq: Two times a day (BID) | CUTANEOUS | Status: DC
  Administered 2017-09-04: 16:00:00 via TOPICAL

## 2017-09-05 ENCOUNTER — Ambulatory Visit
Admission: RE | Admit: 2017-09-05 | Discharge: 2017-09-05 | Disposition: A | Discharge status: Home / Self Care | Payer: Medicare Other | Source: Ambulatory Visit | Attending: Radiation Oncology | Admitting: Radiation Oncology

## 2017-09-05 DIAGNOSIS — Z510 Encounter for antineoplastic radiation therapy: Secondary | ICD-10-CM | POA: Diagnosis not present

## 2017-09-06 ENCOUNTER — Ambulatory Visit
Admission: RE | Admit: 2017-09-06 | Discharge: 2017-09-06 | Disposition: A | Discharge status: Home / Self Care | Payer: Medicare Other | Source: Ambulatory Visit | Attending: Radiation Oncology | Admitting: Radiation Oncology

## 2017-09-06 DIAGNOSIS — Z510 Encounter for antineoplastic radiation therapy: Secondary | ICD-10-CM | POA: Diagnosis not present

## 2017-09-07 ENCOUNTER — Ambulatory Visit
Admission: RE | Admit: 2017-09-07 | Discharge: 2017-09-07 | Disposition: A | Discharge status: Home / Self Care | Payer: Medicare Other | Source: Ambulatory Visit | Attending: Radiation Oncology | Admitting: Radiation Oncology

## 2017-09-07 DIAGNOSIS — Z510 Encounter for antineoplastic radiation therapy: Secondary | ICD-10-CM | POA: Diagnosis not present

## 2017-09-10 ENCOUNTER — Ambulatory Visit: Payer: Medicare Other

## 2017-09-11 ENCOUNTER — Ambulatory Visit
Admission: RE | Admit: 2017-09-11 | Discharge: 2017-09-11 | Disposition: A | Discharge status: Home / Self Care | Payer: Medicare Other | Source: Ambulatory Visit | Attending: Radiation Oncology | Admitting: Radiation Oncology

## 2017-09-11 DIAGNOSIS — Z510 Encounter for antineoplastic radiation therapy: Secondary | ICD-10-CM | POA: Diagnosis not present

## 2017-09-12 ENCOUNTER — Ambulatory Visit
Admission: RE | Admit: 2017-09-12 | Discharge: 2017-09-12 | Disposition: A | Discharge status: Home / Self Care | Payer: Medicare Other | Source: Ambulatory Visit | Attending: Radiation Oncology | Admitting: Radiation Oncology

## 2017-09-12 DIAGNOSIS — Z510 Encounter for antineoplastic radiation therapy: Secondary | ICD-10-CM | POA: Diagnosis not present

## 2017-09-13 ENCOUNTER — Ambulatory Visit
Admission: RE | Admit: 2017-09-13 | Discharge: 2017-09-13 | Disposition: A | Discharge status: Home / Self Care | Payer: Medicare Other | Source: Ambulatory Visit | Attending: Radiation Oncology | Admitting: Radiation Oncology

## 2017-09-13 DIAGNOSIS — Z510 Encounter for antineoplastic radiation therapy: Secondary | ICD-10-CM | POA: Diagnosis not present

## 2017-09-14 ENCOUNTER — Ambulatory Visit
Admission: RE | Admit: 2017-09-14 | Discharge: 2017-09-14 | Disposition: A | Discharge status: Home / Self Care | Payer: Medicare Other | Source: Ambulatory Visit | Attending: Radiation Oncology | Admitting: Radiation Oncology

## 2017-09-14 DIAGNOSIS — Z170 Estrogen receptor positive status [ER+]: Secondary | ICD-10-CM

## 2017-09-14 DIAGNOSIS — C50411 Malignant neoplasm of upper-outer quadrant of right female breast: Secondary | ICD-10-CM

## 2017-09-14 DIAGNOSIS — Z510 Encounter for antineoplastic radiation therapy: Secondary | ICD-10-CM | POA: Diagnosis not present

## 2017-09-17 ENCOUNTER — Ambulatory Visit
Admission: RE | Admit: 2017-09-17 | Discharge: 2017-09-17 | Disposition: A | Discharge status: Home / Self Care | Payer: Medicare Other | Source: Ambulatory Visit | Attending: Radiation Oncology | Admitting: Radiation Oncology

## 2017-09-17 DIAGNOSIS — Z510 Encounter for antineoplastic radiation therapy: Secondary | ICD-10-CM | POA: Diagnosis not present

## 2017-09-18 ENCOUNTER — Ambulatory Visit
Admission: RE | Admit: 2017-09-18 | Discharge: 2017-09-18 | Disposition: A | Discharge status: Home / Self Care | Payer: Medicare Other | Source: Ambulatory Visit | Attending: Radiation Oncology | Admitting: Radiation Oncology

## 2017-09-18 DIAGNOSIS — Z510 Encounter for antineoplastic radiation therapy: Secondary | ICD-10-CM | POA: Diagnosis not present

## 2017-09-19 ENCOUNTER — Ambulatory Visit: Payer: Medicare Other

## 2017-09-19 ENCOUNTER — Ambulatory Visit
Admission: RE | Admit: 2017-09-19 | Discharge: 2017-09-19 | Disposition: A | Discharge status: Home / Self Care | Payer: Medicare Other | Source: Ambulatory Visit | Attending: Radiation Oncology | Admitting: Radiation Oncology

## 2017-09-19 DIAGNOSIS — Z510 Encounter for antineoplastic radiation therapy: Secondary | ICD-10-CM | POA: Diagnosis not present

## 2017-09-20 ENCOUNTER — Ambulatory Visit: Payer: Medicare Other

## 2017-09-20 ENCOUNTER — Ambulatory Visit
Admission: RE | Admit: 2017-09-20 | Discharge: 2017-09-20 | Disposition: A | Discharge status: Home / Self Care | Payer: Medicare Other | Source: Ambulatory Visit | Attending: Radiation Oncology | Admitting: Radiation Oncology

## 2017-09-20 DIAGNOSIS — Z510 Encounter for antineoplastic radiation therapy: Secondary | ICD-10-CM | POA: Diagnosis not present

## 2017-09-21 ENCOUNTER — Ambulatory Visit: Payer: Medicare Other

## 2017-09-21 ENCOUNTER — Ambulatory Visit
Admission: RE | Admit: 2017-09-21 | Discharge: 2017-09-21 | Disposition: A | Discharge status: Home / Self Care | Payer: Medicare Other | Source: Ambulatory Visit | Attending: Radiation Oncology | Admitting: Radiation Oncology

## 2017-09-21 DIAGNOSIS — Z510 Encounter for antineoplastic radiation therapy: Secondary | ICD-10-CM | POA: Diagnosis not present

## 2017-09-24 ENCOUNTER — Ambulatory Visit: Payer: Medicare Other

## 2017-09-24 ENCOUNTER — Ambulatory Visit
Admission: RE | Admit: 2017-09-24 | Discharge: 2017-09-24 | Disposition: A | Discharge status: Home / Self Care | Payer: Medicare Other | Source: Ambulatory Visit | Attending: Radiation Oncology | Admitting: Radiation Oncology

## 2017-09-24 DIAGNOSIS — Z510 Encounter for antineoplastic radiation therapy: Secondary | ICD-10-CM | POA: Diagnosis not present

## 2017-09-26 ENCOUNTER — Ambulatory Visit: Payer: Medicare Other

## 2017-09-26 ENCOUNTER — Ambulatory Visit
Admission: RE | Admit: 2017-09-26 | Discharge: 2017-09-26 | Disposition: A | Discharge status: Home / Self Care | Payer: Medicare Other | Source: Ambulatory Visit | Attending: Radiation Oncology | Admitting: Radiation Oncology

## 2017-09-26 DIAGNOSIS — Z510 Encounter for antineoplastic radiation therapy: Secondary | ICD-10-CM | POA: Diagnosis not present

## 2017-09-27 ENCOUNTER — Encounter: Payer: Self-pay | Admitting: Radiation Oncology

## 2017-09-27 ENCOUNTER — Ambulatory Visit (HOSPITAL_BASED_OUTPATIENT_CLINIC_OR_DEPARTMENT_OTHER): Payer: Medicare Other | Admitting: Hematology and Oncology

## 2017-09-27 ENCOUNTER — Ambulatory Visit: Payer: Medicare Other

## 2017-09-27 ENCOUNTER — Ambulatory Visit
Admission: RE | Admit: 2017-09-27 | Discharge: 2017-09-27 | Disposition: A | Discharge status: Home / Self Care | Payer: Medicare Other | Source: Ambulatory Visit | Attending: Radiation Oncology | Admitting: Radiation Oncology

## 2017-09-27 ENCOUNTER — Telehealth: Payer: Self-pay | Admitting: Hematology and Oncology

## 2017-09-27 VITALS — HR 55 | Temp 98.1°F | Resp 18 | Ht 62.0 in | Wt 118.0 lb

## 2017-09-27 DIAGNOSIS — C50411 Malignant neoplasm of upper-outer quadrant of right female breast: Secondary | ICD-10-CM | POA: Diagnosis not present

## 2017-09-27 DIAGNOSIS — Z170 Estrogen receptor positive status [ER+]: Secondary | ICD-10-CM | POA: Diagnosis not present

## 2017-09-27 DIAGNOSIS — Z510 Encounter for antineoplastic radiation therapy: Secondary | ICD-10-CM | POA: Diagnosis not present

## 2017-09-27 DIAGNOSIS — Z780 Asymptomatic menopausal state: Secondary | ICD-10-CM

## 2017-09-27 MED ORDER — LETROZOLE 2.5 MG PO TABS: 2.5000 mg | ORAL_TABLET | Freq: Every day | ORAL | 3 refills | Status: DC

## 2017-09-27 NOTE — Assessment & Plan Note (Signed)
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 cm, 1/3 micrometastatic disease in lymph nodes ; Left lumpectomy: Complex sclerosing lesion; grade 2, ER +, PR 50%, HER-2 negative ratio 1.26, Ki-67 15%, T1 cN1 mic M0 stage IA AJCC 8  Oncotype DX score 27: Intermediate risk, ROR 12% with tamoxifen alone and 8% with tamoxifen with chemo  Adjuvant radiation therapy completed 09/27/2017 Treatment plan: Adjuvant letrozole 2.5 mg daily To be started 10/16/2017.  Letrozole counseling: We discussed the risks and benefits of anti-estrogen therapy with aromatase inhibitors. These include but not limited to insomnia, hot flashes, mood changes, vaginal dryness, bone density loss, and weight gain. We strongly believe that the benefits far outweigh the risks. Patient understands these risks and consented to starting treatment. Planned treatment duration is 5-7 years. Return to clinic in 3 months for survivorship care plan was

## 2017-09-27 NOTE — Progress Notes (Signed)
Patient Care Team: [name], MD as PCP - General (Family Medicine) Fanny Skates, MD as Consulting Physician (General Surgery) Nicholas Lose, MD as Consulting Physician (Hematology and Oncology) Kyung Rudd, MD as Consulting Physician (Radiation Oncology)  DIAGNOSIS:  Encounter Diagnoses  Name Primary?  . Post-menopausal Yes  . Malignant neoplasm of upper-outer quadrant of right breast in female, estrogen receptor positive ([facility])     SUMMARY OF ONCOLOGIC HISTORY:   Malignant neoplasm of upper-outer quadrant of right breast in female, estrogen receptor positive ([facility])   05/16/2017 Initial Diagnosis    Left breast biopsy UIQ: Complex sclerosing lesion with calcifications and sclerosing adenosis, right breast biopsy 10:00: IDC grade 2, DCIS, ER/PR positive, HER-2 negative ratio 1.26,  11 mm lesion in the right breast at 10:00 position: T1b N0 stage IA clinical stage       06/22/2017 Surgery    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 cm, 1/3 micrometastatic disease in lymph nodes ; Left lumpectomy: Complex sclerosing lesion; grade 2, ER +, PR 50%, HER-2 negative ratio 1.26, Ki-67 15%, T1 cN1 mic M0 stage IA AJCC 8       08/07/2017 Oncotype testing    Oncotype DX score 27: Intermediate risk, 10 year risk of distant recurrence with tamoxifen alone 12% and chemotherapy plus tamoxifen 8%        08/09/2017 - 09/27/2017 Radiation Therapy    Adjuvant radiation therapy       CHIEF COMPLIANT: Follow-up after adjuvant radiation  INTERVAL HISTORY: Angela Buchanan is a 66 year old with above-mentioned history of right breast cancer treated with lumpectomy and she completed adjuvant radiation therapy today.  She is here to discuss the role of adjuvant antiestrogen therapy.  She did not want to undergo chemotherapy with intermediate risk Oncotype DX score.  Her husband is going to head and neck cancer chemotherapy and radiation.  She is under a lot of stress.  REVIEW OF SYSTEMS:     Constitutional: Denies fevers, chills or abnormal weight loss Eyes: Denies blurriness of vision Ears, nose, mouth, throat, and face: Denies mucositis or sore throat Respiratory: Denies cough, dyspnea or wheezes Cardiovascular: Denies palpitation, chest discomfort Gastrointestinal:  Denies nausea, heartburn or change in bowel habits Skin: Denies abnormal skin rashes Lymphatics: Denies new lymphadenopathy or easy bruising Neurological:Denies numbness, tingling or new weaknesses Behavioral/Psych: Mood is stable, no new changes  Extremities: No lower extremity edema All other systems were reviewed with the patient and are negative.  I have reviewed the past medical history, past surgical history, social history and family history with the patient and they are unchanged from previous note.  ALLERGIES:  is allergic to atenolol; bisoprolol; prednisone; and latex.  MEDICATIONS:  Current Outpatient Medications  Medication Sig Dispense Refill  . aspirin 81 MG tablet Take 81 mg by mouth daily.    Marland Kitchen b complex vitamins tablet Take 1 tablet by mouth daily.    . Calcium Citrate 250 MG TABS Take 250 mg by mouth 4 (four) times daily.    . cholecalciferol (VITAMIN D) 1000 units tablet Take 2,000 Units by mouth daily.    . citalopram (CELEXA) 10 MG tablet Take 10 mg by mouth daily as needed (anxiety).     . fluticasone (FLONASE) 50 MCG/ACT nasal spray     . HYDROcodone-acetaminophen (NORCO) 5-325 MG tablet Take 1-2 tablets by mouth every 6 (six) hours as needed for moderate pain or severe pain. (Patient not taking: Reported on 07/31/2017) 30 tablet 0  . letrozole (FEMARA) 2.5  MG tablet Take 1 tablet (2.5 mg total) by mouth daily. 90 tablet 3  . lisinopril (PRINIVIL,ZESTRIL) 20 MG tablet Take 1 tablet (20 mg total) by mouth daily. 90 tablet 1  . montelukast (SINGULAIR) 10 MG tablet Take 10 mg by mouth at bedtime.     . Multiple Vitamin (MULTIVITAMIN WITH MINERALS) TABS tablet Take 1 tablet by mouth  daily.     No current facility-administered medications for this visit.     PHYSICAL EXAMINATION: ECOG PERFORMANCE STATUS: 1 - Symptomatic but completely ambulatory  Vitals:   09/27/17 1553  Pulse: (!) 55  Resp: 18  Temp: 98.1 F (36.7 C)  SpO2: 99%   Filed Weights   09/27/17 1553  Weight: 118 lb (53.5 kg)    GENERAL:alert, no distress and comfortable SKIN: skin color, texture, turgor are normal, no rashes or significant lesions EYES: normal, Conjunctiva are pink and non-injected, sclera clear OROPHARYNX:no exudate, no erythema and lips, buccal mucosa, and tongue normal  NECK: supple, thyroid normal size, non-tender, without nodularity LYMPH:  no palpable lymphadenopathy in the cervical, axillary or inguinal LUNGS: clear to auscultation and percussion with normal breathing effort HEART: regular rate & rhythm and no murmurs and no lower extremity edema ABDOMEN:abdomen soft, non-tender and normal bowel sounds MUSCULOSKELETAL:no cyanosis of digits and no clubbing  NEURO: alert & oriented x 3 with fluent speech, no focal motor/sensory deficits EXTREMITIES: No lower extremity edema  LABORATORY DATA:  I have reviewed the data as listed   Chemistry      Component Value Date/Time   NA 138 05/23/2017 1209   K 3.8 05/23/2017 1209   CL 103 10/20/2011 2104   CO2 25 05/23/2017 1209   BUN 11.9 05/23/2017 1209   CREATININE 0.8 05/23/2017 1209      Component Value Date/Time   CALCIUM 9.5 05/23/2017 1209   ALKPHOS 99 05/23/2017 1209   AST 20 05/23/2017 1209   ALT 12 05/23/2017 1209   BILITOT 0.50 05/23/2017 1209       Lab Results  Component Value Date   WBC 5.1 05/23/2017   HGB 12.5 05/23/2017   HCT 36.6 05/23/2017   MCV 97.5 05/23/2017   PLT 433 (H) 05/23/2017   NEUTROABS 2.7 05/23/2017    ASSESSMENT & PLAN:  Malignant neoplasm of upper-outer quadrant of right breast in female, estrogen receptor positive ([facility]) 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 cm, 1/3  micrometastatic disease in lymph nodes ; Left lumpectomy: Complex sclerosing lesion; grade 2, ER +, PR 50%, HER-2 negative ratio 1.26, Ki-67 15%, T1 cN1 mic M0 stage IA AJCC 8  Oncotype DX score 27: Intermediate risk, ROR 12% with tamoxifen alone and 8% with tamoxifen with chemo  Adjuvant radiation therapy completed 09/27/2017 Treatment plan: Adjuvant letrozole 2.5 mg daily To be started 10/16/2017.  Letrozole counseling: We discussed the risks and benefits of anti-estrogen therapy with aromatase inhibitors. These include but not limited to insomnia, hot flashes, mood changes, vaginal dryness, bone density loss, and weight gain. We strongly believe that the benefits far outweigh the risks. Patient understands these risks and consented to starting treatment. Planned treatment duration is 5-7 years.  Return to clinic in 3 months for survivorship care plan visit    I spent 25 minutes talking to the patient of which more than half was spent in counseling and coordination of care.  Orders Placed This Encounter  Procedures  . DG Bone Density    Standing Status:   Future    Standing Expiration Date:  09/27/2018    Order Specific Question:   Reason for Exam (SYMPTOM  OR DIAGNOSIS REQUIRED)    Answer:   Post menopausal on anti estrogen therapy    Order Specific Question:   Preferred imaging location?    Answer:   [facility]   The patient has a good understanding of the overall plan. she agrees with it. she will call with any problems that may develop before the next visit here.   Harriette Ohara, MD 09/27/17

## 2017-09-27 NOTE — Telephone Encounter (Signed)
Gave patient AVS and calendar of upcoming March appointments.  °

## 2017-10-05 ENCOUNTER — Other Ambulatory Visit: Payer: Self-pay

## 2017-10-05 MED ORDER — LETROZOLE 2.5 MG PO TABS: 2.5000 mg | ORAL_TABLET | Freq: Every day | ORAL | 3 refills | Status: DC

## 2017-10-05 NOTE — Telephone Encounter (Signed)
Received fax from pt pharmacy stating letrozole was on backorder for them. Called pt to see if there was another pharmacy she would like to use. Sent a script to [facility] on [address].  Cyndia Bent RN

## 2017-10-12 NOTE — Progress Notes (Signed)
  Radiation Oncology         [phone] ________________________________  Name: Angela Buchanan MRN: [id number]  Date: 09/27/2017  DOB: April 06, 1951  End of Treatment Note  Diagnosis:   67 y.o. female with Stage [id number], Grade 2, ER/PR positive invasive ductal carcinoma of the right breast    Indication for treatment:  Curative       Radiation treatment dates:   08/09/2017 - 09/27/2017  Site/dose:   The patient initially received a dose of 50.4 Gy in 28 fractions to the right breast and supraclavicular region using whole-breast tangent fields. This was delivered using a 3-D conformal technique. The patient then received a boost to the seroma. This delivered an additional 10 Gy in 5 fractions using a 3D technique. The total dose was 60.4 Gy.  Narrative: The patient tolerated radiation treatment relatively well.   The patient had some expected fatigue and skin irritation as she progressed during treatment. Moist desquamation was not present at the end of treatment.  Plan: The patient has completed radiation treatment.The patient will continue her skin care routine and return to radiation oncology clinic for routine followup in one month. I advised the patient to call or return sooner if they have any questions or concerns related to their recovery or treatment. ________________________________  Jodelle Gross, MD, PhD  This document serves as a record of services personally performed by Kyung Rudd, MD. It was created on his behalf by Rae Lips, a trained medical scribe. The creation of this record is based on the scribe's personal observations and the provider's statements to them. This document has been checked and approved by the attending provider.

## 2017-10-29 ENCOUNTER — Telehealth: Payer: Self-pay | Admitting: Radiation Oncology

## 2017-10-29 DIAGNOSIS — Z170 Estrogen receptor positive status [ER+]: Secondary | ICD-10-CM

## 2017-10-29 DIAGNOSIS — C50411 Malignant neoplasm of upper-outer quadrant of right female breast: Secondary | ICD-10-CM

## 2017-10-29 NOTE — Telephone Encounter (Signed)
I left a message for the patient and realized that she was here right now with her husband who is being treated by Dr. Isidore Moos. I examined her and she has some palpable cording of her right axilla. She has been taking ibuprofen without significant relief. I let her know that there was not evidence of edema or cellulitic appearing changes, and rather, we would recommend PT evaluation and no need for medication. She will try to alternate ibuprofen and tylenol and also consider using her pain medication if she has severe pain from her surgery.      Angela Buchanan, PAC

## 2017-10-30 ENCOUNTER — Inpatient Hospital Stay: Admission: RE | Admit: 2017-10-30 | Payer: Self-pay | Source: Ambulatory Visit | Admitting: Radiation Oncology

## 2017-10-30 ENCOUNTER — Other Ambulatory Visit: Payer: Self-pay | Admitting: Hematology and Oncology

## 2017-10-30 DIAGNOSIS — E2839 Other primary ovarian failure: Secondary | ICD-10-CM

## 2017-11-01 ENCOUNTER — Other Ambulatory Visit: Payer: Medicare Other

## 2017-11-01 NOTE — Progress Notes (Signed)
Simulation note The patient was brought to the treatment room for simulation for the patient's upcoming electron treatment. The patient was setup in the treatment position and the target region was delineated. The patient will receive treatment to the right breast/ chest wall using an en face electron field. One customized block/complex treatment device has been constructed for this purpose, and this will be used on a daily basis during the patient's treatment. After appropriate set up was confirmed, skin markings were placed to allow accurate targeting of the treatment area during the patient's course of therapy.  ------------------------------------------------  Jodelle Gross, MD, PhD

## 2017-11-01 NOTE — Addendum Note (Signed)
Encounter addended by: Kyung Rudd, MD on: 11/01/2017 3:04 PM  Actions taken: Visit diagnoses modified, Sign clinical note

## 2017-11-06 ENCOUNTER — Other Ambulatory Visit: Payer: Self-pay

## 2017-11-06 ENCOUNTER — Encounter: Payer: Self-pay | Admitting: Physical Therapy

## 2017-11-06 ENCOUNTER — Ambulatory Visit: Payer: Medicare Other | Attending: Radiation Oncology | Admitting: Physical Therapy

## 2017-11-06 DIAGNOSIS — L599 Disorder of the skin and subcutaneous tissue related to radiation, unspecified: Secondary | ICD-10-CM

## 2017-11-06 DIAGNOSIS — M25611 Stiffness of right shoulder, not elsewhere classified: Secondary | ICD-10-CM | POA: Diagnosis present

## 2017-11-06 DIAGNOSIS — R293 Abnormal posture: Secondary | ICD-10-CM | POA: Diagnosis not present

## 2017-11-06 DIAGNOSIS — M25511 Pain in right shoulder: Secondary | ICD-10-CM | POA: Insufficient documentation

## 2017-11-06 NOTE — Therapy (Signed)
[place], [place], [address] Phone: [phone]   Fax:  [phone]  Physical Therapy Evaluation  Patient Details  Name: Angela Buchanan MRN: [id number] Date of Birth: Sep 15, 1951 Referring Provider: Shona Simpson    Encounter Date: 11/06/2017  PT End of Session - 11/06/17 1750    Visit Number  1    Number of Visits  9    Date for PT Re-Evaluation  12/07/17    PT Start Time  0932    PT Stop Time  1600    PT Time Calculation (min)  45 min    Activity Tolerance  Patient tolerated treatment well    Behavior During Therapy  [facility] for tasks assessed/performed       Past Medical History:  Diagnosis Date  . Bigeminy    no current med.  . Breast cancer ([place]) 06/2017   right  . Bruises easily   . Dental crowns present   . History of diverticulitis   . Hypertension    states under control with med., has been on med. x 5 yr.  . 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 Seasonal allergies     Past Surgical History:  Procedure Laterality Date  . ABDOMINAL HYSTERECTOMY     partial  . BREAST LUMPECTOMY WITH RADIOACTIVE SEED AND SENTINEL LYMPH NODE BIOPSY Right 06/22/2017   Procedure: RIGHT BREAST LUMPECTOMY WITH RADIOACTIVE SEED AND RIGHT AXILLARY DEEP SENTINEL LYMPH NODE BIOPSY WITH BLUE DYE INJECTION;  Surgeon: Fanny Skates, MD;  Location: [place];  Service: General;  Laterality: Right;  . BREAST LUMPECTOMY WITH RADIOACTIVE SEED LOCALIZATION Left 06/22/2017   Procedure: LEFT BREAST LUMPECTOMY WITH RADIOACTIVE SEED LOCALIZATION;  Surgeon: Fanny Skates, MD;  Location: [place];  Service: General;  Laterality: Left;  . CATARACT EXTRACTION W/ INTRAOCULAR LENS  IMPLANT, BILATERAL Bilateral   . TONSILLECTOMY     age 67    There were no vitals filed for this visit.   Subjective Assessment - 11/06/17 1529    Subjective  Pain in right lateral chest  and axilla when  she tries to move her right arm     Pertinent History  right breast cancer with lumpectomy with 3 deep nodes removed and left breast sclerotic lesion with lumpectomy on 06/22/2017 followed by radition completed 09/27/2017 ( no chemotherapy)  Husband is now undergoing treatment for head and neck cancer and is having a difficulty time.  Pt has developed pain and limited range of motion of right shoulder     Patient Stated Goals  to get rid of pain and use her right arm normally     Currently in Pain?  Yes    Pain Score  7     Pain Location  Axilla    Pain Orientation  Right    Pain Descriptors / Indicators  Sharp    Pain Type  Acute pain    Pain Radiating Towards  towards arm to hands     Pain Onset  1 to 4 weeks ago    Pain Frequency  Intermittent    Aggravating Factors   pulling on her arm with or anything that requires effort with her right arm     Pain Relieving Factors  over the counter medicine helps     Effect of Pain on Daily Activities  pt has difficulty driving her husband to her appointments          [facility] PT  Assessment - 11/06/17 0001      Assessment   Medical Diagnosis  right breast cancer     Referring Provider  Shona Simpson     Onset Date/Surgical Date  06/22/17    Hand Dominance  Right      Precautions   Precautions  None    Precaution Comments  pt husbands is undergoing treatment for head and neck cancer  and requires significant physical assistance       Restrictions   Weight Bearing Restrictions  No      Balance Screen   Has the patient fallen in the past 6 months  No    Has the patient had a decrease in activity level because of a fear of falling?   No    Is the patient reluctant to leave their home because of a fear of falling?   No      Home Film/video editor residence    Living Arrangements  Spouse/significant other    Available Help at Discharge  -- no other help from family and friends at this time       Prior Function    Level of Independence  Independent    Vocation  Retired    Leisure  pt is taking care of husband who has a feeding tube and she has to help him get dressed  husband is very weak and gets confused at times       Cognition   Overall Cognitive Status  Within Functional Limits for tasks assessed      Observation/Other Assessments   Observations   thin woman with well healed scars in right axilla and breast  large skin tag at anterior axilla     Skin Integrity  no open areas     Quick DASH   56.82      Sensation   Light Touch  Not tested      Coordination   Gross Motor Movements are Fluid and Coordinated  Yes      Posture/Postural Control   Posture/Postural Control  Postural limitations    Postural Limitations  Rounded Shoulders;Forward head    Posture Comments  glenohumeral joints sit forward on trunk       ROM / Strength   AROM / PROM / Strength  AROM;PROM;Strength      AROM   Overall AROM Comments  right shoulder movements limited by pain     AROM Assessment Site  Shoulder    Right/Left Shoulder  Right;Left    Right Shoulder Flexion  110 Degrees    Right Shoulder ABduction  102 Degrees visible cording in axilla     Left Shoulder Flexion  135 Degrees    Left Shoulder ABduction  160 Degrees      PROM   PROM Assessment Site  --      Strength   Overall Strength  Deficits    Overall Strength Comments  generalized muscle atrophy,    Strength Assessment Site  Shoulder    Right/Left Shoulder  Right;Left    Right Shoulder Flexion  2+/5 limited by pain     Right Shoulder ABduction  2+/5 limited by pain     Left Shoulder Flexion  3+/5    Left Shoulder ABduction  3+/5      Palpation   Palpation comment  tender tightness and axillary cording in right axilla and right lateral chest near scars  very tight trigger points at right lateral scapula  LYMPHEDEMA/ONCOLOGY QUESTIONNAIRE - 11/06/17 1549      Right Upper Extremity Lymphedema   10 cm Proximal to Olecranon Process   23 cm    Olecranon Process  21.5 cm    10 cm Proximal to Ulnar Styloid Process  20 cm    Just Proximal to Ulnar Styloid Process  13.5 cm    Across Hand at [employer]  16 cm    At [place] of 2nd Digit  4.7 cm      Left Upper Extremity Lymphedema   10 cm Proximal to Olecranon Process  23 cm    Olecranon Process  22 cm    10 cm Proximal to Ulnar Styloid Process  19 cm    Just Proximal to Ulnar Styloid Process  13.5 cm    Across Hand at [employer]  16 cm    At [place] of 2nd Digit  4.7 cm        Quick Dash - 11/06/17 0001    Open a tight or new jar  Mild difficulty    Do heavy household chores (wash walls, wash floors)  Severe difficulty    Carry a shopping bag or briefcase  Moderate difficulty    Wash your back  Severe difficulty    Use a knife to cut food  Mild difficulty    Recreational activities in which you take some force or impact through your arm, shoulder, or hand (golf, hammering, tennis)  Severe difficulty    During the past week, to what extent has your arm, shoulder or hand problem interfered with your normal social activities with family, friends, neighbors, or groups?  Quite a bit    During the past week, to what extent has your arm, shoulder or hand problem limited your work or other regular daily activities  Quite a bit    Arm, shoulder, or hand pain.  Moderate    Tingling (pins and needles) in your arm, shoulder, or hand  Moderate    Difficulty Sleeping  Moderate difficulty    DASH Score  56.82 %       Objective measurements completed on examination: See above findings.      [facility] Adult PT Treatment/Exercise - 11/06/17 0001      Shoulder Exercises: Supine   Flexion  AAROM;Right;Left;5 reps with dowel     ABduction  AAROM;Right;5 reps with dowel              PT Education - 11/06/17 1749    Education provided  Yes    Education Details  supine dowel rod flexion and abduction     Person(s) Educated  Patient    Methods  Explanation;Demonstration     Comprehension  Verbalized understanding;Returned demonstration          PT Long Term Goals - 11/06/17 1758      PT LONG TERM GOAL #1   Title  Pt will report the pain in her right upper quadrant is decreased so that she can turn the steering wheel without pain while she is driving     Time  4    Period  Weeks    Status  New      PT LONG TERM GOAL #2   Title  Pt will be independent in a home exercise program     Time  4    Period  Weeks    Status  New      PT LONG TERM GOAL #3   Title  Pt will have 150 degrees of right shoulder abduction so that she can perform her household activities easier     Baseline  102 on eval     Time  4    Period  Weeks    Status  New             Plan - 11/06/17 1750    Clinical Impression Statement  67 yo female s/p surgery and radiation for right breast cancer and also lumpectomy on the left who presents with pain and tightness in rigth axilla with , weakness and decreased range of motion of right  shoulder     History and Personal Factors relevant to plan of care:  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 needs significant hands on care after treatment for head and neck cancer     Clinical Presentation  Stable    Clinical Presentation due to:  no more treatment     Clinical Decision Making  Low    Rehab Potential  Good    Clinical Impairments Affecting Rehab Potential  3 deep nodes removed on right side, radiation to right upper quadrant     PT Frequency  2x / week    PT Duration  4 weeks    PT Treatment/Interventions  ADLs/Self Care Home Management;Compression bandaging;Scar mobilization;Passive range of motion;Patient/family education;Therapeutic exercise;Therapeutic activities;Manual techniques;Taping;Orthotic Fit/Training;DME Instruction    PT Next Visit Plan  teach Meeks decompression and progress HEP.  try pulley, wall stretches, A/AA/ PROM, to right shoulder and scapula, myofascial techniqus and soft tissue work to right axilla and lateral  scapula     PT Home Exercise Plan  supine dowel for shoulder flexion and abduction     Consulted and Agree with Plan of Care  Patient       Patient will benefit from skilled therapeutic intervention in order to improve the following deficits and impairments:  Decreased scar mobility, Decreased knowledge of precautions, Decreased knowledge of use of DME, Decreased strength, Increased fascial restricitons, Impaired UE functional use, Pain, Decreased range of motion, Impaired perceived functional ability, Postural dysfunction  Visit Diagnosis: Abnormal posture - Plan: PT plan of care cert/re-cert  Disorder of the skin and subcutaneous tissue related to radiation, unspecified - Plan: PT plan of care cert/re-cert  Acute pain of right shoulder - Plan: PT plan of care cert/re-cert  Stiffness of right shoulder, not elsewhere classified - Plan: PT plan of care cert/re-cert     Problem List Patient Active Problem List   Diagnosis Date Noted  . Malignant neoplasm of upper-outer quadrant of right breast in female, estrogen receptor positive ([place]) 05/22/2017  . Diplopia 09/17/2014  . Dizziness and giddiness 09/17/2014  . Essential hypertension 07/22/2014  . Chest pain 07/22/2014  . Family history of heart disease 07/22/2014   Angela Buchanan PT  Angela Buchanan 11/06/2017, 6:02 PM  [place], [place], [address] Phone: [phone]   Fax:  [phone]  Name: Angela Buchanan MRN: [id number] Date of Birth: 1951-01-28

## 2017-11-06 NOTE — Patient Instructions (Signed)
SHOULDER: Flexion - Supine (Cane)        Cancer Rehab [phone]    Hold cane in both hands. Raise arms up overhead. Do not allow back to arch. Hold _5__ seconds. Do __5-10__ times; __1-2__ times a day.   SELF ASSISTED WITH OBJECT: Shoulder Abduction / Adduction - Supine    Hold cane with both hands. Move both arms from side to side, keep elbows straight.  Hold when stretch felt for __5__ seconds. Repeat __5-10__ times; __1-2__ times a day. Once this becomes easier progress to third picture bringing affected arm towards ear by staying out to side. Same hold for _5_seconds. Repeat  _5-10_ times, _1-2_ times/day.   

## 2017-11-08 ENCOUNTER — Ambulatory Visit: Payer: Medicare Other | Admitting: Physical Therapy

## 2017-11-08 ENCOUNTER — Encounter: Payer: Self-pay | Admitting: Physical Therapy

## 2017-11-08 ENCOUNTER — Ambulatory Visit: Payer: Self-pay | Admitting: Radiation Oncology

## 2017-11-08 DIAGNOSIS — M25511 Pain in right shoulder: Secondary | ICD-10-CM

## 2017-11-08 DIAGNOSIS — R293 Abnormal posture: Secondary | ICD-10-CM

## 2017-11-08 DIAGNOSIS — M25611 Stiffness of right shoulder, not elsewhere classified: Secondary | ICD-10-CM

## 2017-11-08 DIAGNOSIS — L599 Disorder of the skin and subcutaneous tissue related to radiation, unspecified: Secondary | ICD-10-CM

## 2017-11-08 NOTE — Therapy (Signed)
[place], [place], [address] Phone: [phone]   Fax:  [phone]  Physical Therapy Treatment  Patient Details  Name: Angela Buchanan MRN: [id number] Date of Birth: Mar 29, 1951 Referring Provider: Shona Simpson    Encounter Date: 11/08/2017  PT End of Session - 11/08/17 1728    Visit Number  2    Number of Visits  9    Date for PT Re-Evaluation  12/07/17    PT Start Time  1430    PT Stop Time  1515    PT Time Calculation (min)  45 min    Activity Tolerance  Patient tolerated treatment well    Behavior During Therapy  [facility] for tasks assessed/performed       Past Medical History:  Diagnosis Date  . Bigeminy    no current med.  . Breast cancer ([place]) 06/2017   right  . Bruises easily   . Dental crowns present   . History of diverticulitis   . Hypertension    states under control with med., has been on med. x 5 yr.  . 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 Seasonal allergies     Past Surgical History:  Procedure Laterality Date  . ABDOMINAL HYSTERECTOMY     partial  . BREAST LUMPECTOMY WITH RADIOACTIVE SEED AND SENTINEL LYMPH NODE BIOPSY Right 06/22/2017   Procedure: RIGHT BREAST LUMPECTOMY WITH RADIOACTIVE SEED AND RIGHT AXILLARY DEEP SENTINEL LYMPH NODE BIOPSY WITH BLUE DYE INJECTION;  Surgeon: Fanny Skates, MD;  Location: [place];  Service: General;  Laterality: Right;  . BREAST LUMPECTOMY WITH RADIOACTIVE SEED LOCALIZATION Left 06/22/2017   Procedure: LEFT BREAST LUMPECTOMY WITH RADIOACTIVE SEED LOCALIZATION;  Surgeon: Fanny Skates, MD;  Location: [place];  Service: General;  Laterality: Left;  . CATARACT EXTRACTION W/ INTRAOCULAR LENS  IMPLANT, BILATERAL Bilateral   . TONSILLECTOMY     age 67    There were no vitals filed for this visit.  Subjective Assessment - 11/08/17 1437    Subjective  Pt states she has been doing the exercises at  home "They are okay" she has lots of trouble with the vacuming at home and reaching     Pertinent History  right breast cancer with lumpectomy with 3 deep nodes removed and left breast sclerotic lesion with lumpectomy on 06/22/2017 followed by radition completed 09/27/2017 ( no chemotherapy)  Husband is now undergoing treatment for head and neck cancer and is having a difficulty time.  Pt has developed pain and limited range of motion of right shoulder     Patient Stated Goals  to get rid of pain and use her right arm normally     Currently in Pain?  Yes    Pain Score  5     Pain Location  Axilla    Pain Orientation  Right    Pain Type  Acute pain    Pain Radiating Towards  was radiating this morning.     Pain Onset  1 to 4 weeks ago    Aggravating Factors   reaching and vacuuming     Pain Relieving Factors  over the counter mediine                       [facility] Adult PT Treatment/Exercise - 11/08/17 0001      Shoulder Exercises: Supine   Protraction  AROM;Right;10 reps    Horizontal ABduction  AROM;5 reps  Flexion  AAROM;Right;Left;5 reps with dowel     ABduction  AAROM;Right;5 reps with dowel     Other Supine Exercises  meeks decompression,     Other Supine Exercises  small controlled circles X 10 in each direction       Shoulder Exercises: Sidelying   ABduction  AROM;Right overhead stretch with deep breath at the top     Other Sidelying Exercises  small circles in each direction       Manual Therapy   Manual Therapy  Soft tissue mobilization;Myofascial release    Manual therapy comments  --    Soft tissue mobilization  with biotone in in supine and left sidelying to lateral chest near axilla and scapular border     Myofascial Release  prolonged pressure to tender trigger points at lateral chest near axilla                   PT Long Term Goals - 11/06/17 1758      PT LONG TERM GOAL #1   Title  Pt will report the pain in her right upper quadrant is  decreased so that she can turn the steering wheel without pain while she is driving     Time  4    Period  Weeks    Status  New      PT LONG TERM GOAL #2   Title  Pt will be independent in a home exercise program     Time  4    Period  Weeks    Status  New      PT LONG TERM GOAL #3   Title  Pt will have 150 degrees of right shoulder abduction so that she can perform her household activities easier     Baseline  102 on eval     Time  4    Period  Weeks    Status  New            Plan - 11/08/17 1731    Clinical Impression Statement  Pt had some release of trigger points with treatment with increased painfree AROM  She was pleased  and felt better as she left.      Rehab Potential  Good    Clinical Impairments Affecting Rehab Potential  3 deep nodes removed on right side, radiation to right upper quadrant     PT Treatment/Interventions  ADLs/Self Care Home Management;Compression bandaging;Scar mobilization;Passive range of motion;Patient/family education;Therapeutic exercise;Therapeutic activities;Manual techniques;Taping;Orthotic Fit/Training;DME Instruction    PT Next Visit Plan    try pulley, wall stretches, A/AA/ PROM, to right shoulder and scapula, myofascial techniqus and soft tissue work to right axilla and lateral scapula     PT Home Exercise Plan  supine dowel for shoulder flexion and abduction , Meeks decompresssion     Consulted and Agree with Plan of Care  Patient       Patient will benefit from skilled therapeutic intervention in order to improve the following deficits and impairments:  Decreased scar mobility, Decreased knowledge of precautions, Decreased knowledge of use of DME, Decreased strength, Increased fascial restricitons, Impaired UE functional use, Pain, Decreased range of motion, Impaired perceived functional ability, Postural dysfunction  Visit Diagnosis: Abnormal posture  Disorder of the skin and subcutaneous tissue related to radiation,  unspecified  Acute pain of right shoulder  Stiffness of right shoulder, not elsewhere classified     Problem List Patient Active Problem List   Diagnosis Date Noted  . Malignant neoplasm  of upper-outer quadrant of right breast in female, estrogen receptor positive ([place]) 05/22/2017  . Diplopia 09/17/2014  . Dizziness and giddiness 09/17/2014  . Essential hypertension 07/22/2014  . Chest pain 07/22/2014  . Family history of heart disease 07/22/2014   Donato Heinz. Owens Shark PT  Norwood Levo 11/08/2017, 5:47 PM  [place], [place], [address] Phone: [phone]   Fax:  [phone]  Name: Angela Buchanan MRN: [id number] Date of Birth: [birth year]

## 2017-11-08 NOTE — Patient Instructions (Signed)

## 2017-11-13 ENCOUNTER — Encounter: Payer: Self-pay | Admitting: Physical Therapy

## 2017-11-13 ENCOUNTER — Ambulatory Visit: Payer: Medicare Other | Admitting: Physical Therapy

## 2017-11-13 DIAGNOSIS — R293 Abnormal posture: Secondary | ICD-10-CM | POA: Diagnosis not present

## 2017-11-13 DIAGNOSIS — M25511 Pain in right shoulder: Secondary | ICD-10-CM

## 2017-11-13 DIAGNOSIS — M25611 Stiffness of right shoulder, not elsewhere classified: Secondary | ICD-10-CM

## 2017-11-13 DIAGNOSIS — L599 Disorder of the skin and subcutaneous tissue related to radiation, unspecified: Secondary | ICD-10-CM

## 2017-11-13 NOTE — Therapy (Signed)
[place], [place], [address] Phone: [phone]   Fax:  [phone]  Physical Therapy Treatment  Patient Details  Name: Angela Buchanan MRN: [id number] Date of Birth: 05-26-1951 Referring Provider: Shona Simpson    Encounter Date: 11/13/2017  PT End of Session - 11/13/17 1233    Visit Number  3    Number of Visits  9    Date for PT Re-Evaluation  12/07/17    PT Start Time  1105    PT Stop Time  1145    PT Time Calculation (min)  40 min    Activity Tolerance  Patient tolerated treatment well    Behavior During Therapy  [facility] for tasks assessed/performed       Past Medical History:  Diagnosis Date  . Bigeminy    no current med.  . Breast cancer ([place]) 06/2017   right  . Bruises easily   . Dental crowns present   . History of diverticulitis   . Hypertension    states under control with med., has been on med. x 5 yr.  . 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 Seasonal allergies     Past Surgical History:  Procedure Laterality Date  . ABDOMINAL HYSTERECTOMY     partial  . BREAST LUMPECTOMY WITH RADIOACTIVE SEED AND SENTINEL LYMPH NODE BIOPSY Right 06/22/2017   Procedure: RIGHT BREAST LUMPECTOMY WITH RADIOACTIVE SEED AND RIGHT AXILLARY DEEP SENTINEL LYMPH NODE BIOPSY WITH BLUE DYE INJECTION;  Surgeon: Fanny Skates, MD;  Location: [place];  Service: General;  Laterality: Right;  . BREAST LUMPECTOMY WITH RADIOACTIVE SEED LOCALIZATION Left 06/22/2017   Procedure: LEFT BREAST LUMPECTOMY WITH RADIOACTIVE SEED LOCALIZATION;  Surgeon: Fanny Skates, MD;  Location: [place];  Service: General;  Laterality: Left;  . CATARACT EXTRACTION W/ INTRAOCULAR LENS  IMPLANT, BILATERAL Bilateral   . TONSILLECTOMY     age 67    There were no vitals filed for this visit.  Subjective Assessment - 11/13/17 1110    Subjective  Pt states she got relief from the last  treatment for at least 2 days.  She started to have symptoms again whenever she had to raise her arms and pull on anything   (Pended)     Pertinent History  right breast cancer with lumpectomy with 3 deep nodes removed and left breast sclerotic lesion with lumpectomy on 06/22/2017 followed by radition completed 09/27/2017 ( no chemotherapy)  Husband is now undergoing treatment for head and neck cancer and is having a difficulty time.  Pt has developed pain and limited range of motion of right shoulder   (Pended)     Patient Stated Goals  to get rid of pain and use her right arm normally   (Pended)     Currently in Pain?  Yes  (Pended)     Pain Score  5   (Pended)  pt took one tylenol this moring.     Pain Location  Axilla  (Pended)     Pain Orientation  Right  (Pended)     Pain Descriptors / Indicators  Aching  (Pended)     Pain Type  Acute pain  (Pended)                       [facility] Adult PT Treatment/Exercise - 11/13/17 0001      Lumbar Exercises: Supine   Other Supine Lumbar Exercises  lower trunk rotation  with goal post arms       Shoulder Exercises: Sidelying   ABduction  AROM;Right overhead stretch with deep breath at the top     Other Sidelying Exercises  small circles in each direction       Shoulder Exercises: Stretch   Other Shoulder Stretches  back up against the wall for scapular retraction and anterior chest openeing.  Pt needed significant cueing to keep scapula down away from ears and to relax neck and shoulders       Manual Therapy   Manual Therapy  Soft tissue mobilization;Myofascial release    Soft tissue mobilization  with biotone in in supine and left sidelying to lateral chest near axilla and scapular border  siginificant persistent rigger point in axillasubscapularis     Myofascial Release  prolonged pressure to tender trigger points at lateral chest near axilla                   PT Long Term Goals - 11/06/17 1758      PT LONG TERM GOAL #1    Title  Pt will report the pain in her right upper quadrant is decreased so that she can turn the steering wheel without pain while she is driving     Time  4    Period  Weeks    Status  New      PT LONG TERM GOAL #2   Title  Pt will be independent in a home exercise program     Time  4    Period  Weeks    Status  New      PT LONG TERM GOAL #3   Title  Pt will have 150 degrees of right shoulder abduction so that she can perform her household activities easier     Baseline  102 on eval     Time  4    Period  Weeks    Status  New            Plan - 11/13/17 1234    Clinical Impression Statement  Pt felt better at end of session.  Still has persistent trigger points that needs continued work. She needs frequent cueing for posture and is ready to progress exercise     Clinical Impairments Affecting Rehab Potential  3 deep nodes removed on right side, radiation to right upper quadrant     PT Treatment/Interventions  ADLs/Self Care Home Management;Compression bandaging;Scar mobilization;Passive range of motion;Patient/family education;Therapeutic exercise;Therapeutic activities;Manual techniques;Taping;Orthotic Fit/Training;DME Instruction    PT Next Visit Plan    try pulley, wall stretches, A/AA/ PROM, to right shoulder and scapula, myofascial techniqus and soft tissue work to right axilla and lateral scapula     PT Home Exercise Plan  supine dowel for shoulder flexion and abduction , Meeks decompresssion , standing posture and scapular retraction     Consulted and Agree with Plan of Care  Patient       Patient will benefit from skilled therapeutic intervention in order to improve the following deficits and impairments:  Decreased scar mobility, Decreased knowledge of precautions, Decreased knowledge of use of DME, Decreased strength, Increased fascial restricitons, Impaired UE functional use, Pain, Decreased range of motion, Impaired perceived functional ability, Postural  dysfunction  Visit Diagnosis: Abnormal posture  Disorder of the skin and subcutaneous tissue related to radiation, unspecified  Acute pain of right shoulder  Stiffness of right shoulder, not elsewhere classified     Problem List Patient Active Problem List  Diagnosis Date Noted  . Malignant neoplasm of upper-outer quadrant of right breast in female, estrogen receptor positive ([place]) 05/22/2017  . Diplopia 09/17/2014  . Dizziness and giddiness 09/17/2014  . Essential hypertension 07/22/2014  . Chest pain 07/22/2014  . Family history of heart disease 07/22/2014   Donato Heinz. Owens Shark PT  Norwood Levo 11/13/2017, 12:38 PM  [place], [place], [address] Phone: [phone]   Fax:  [phone]  Name: Angela Buchanan MRN: [id number] Date of Birth: Jun 23, 1951

## 2017-11-15 ENCOUNTER — Encounter: Payer: Self-pay | Admitting: Physical Therapy

## 2017-11-15 ENCOUNTER — Ambulatory Visit: Payer: Medicare Other | Admitting: Physical Therapy

## 2017-11-15 DIAGNOSIS — M25511 Pain in right shoulder: Secondary | ICD-10-CM

## 2017-11-15 DIAGNOSIS — R293 Abnormal posture: Secondary | ICD-10-CM

## 2017-11-15 DIAGNOSIS — L599 Disorder of the skin and subcutaneous tissue related to radiation, unspecified: Secondary | ICD-10-CM

## 2017-11-15 DIAGNOSIS — M25611 Stiffness of right shoulder, not elsewhere classified: Secondary | ICD-10-CM

## 2017-11-15 NOTE — Therapy (Signed)
[place], [place], [address] Phone: [phone]   Fax:  [phone]  Physical Therapy Treatment  Patient Details  Name: Angela Buchanan MRN: [id number] Date of Birth: 1951-04-01 Referring Provider: Shona Simpson    Encounter Date: 11/15/2017  PT End of Session - 11/15/17 1701    Visit Number  4    Number of Visits  9    Date for PT Re-Evaluation  12/07/17    PT Start Time  6546    PT Stop Time  1345    PT Time Calculation (min)  40 min    Activity Tolerance  Patient tolerated treatment well    Behavior During Therapy  [facility] for tasks assessed/performed       Past Medical History:  Diagnosis Date  . Bigeminy    no current med.  . Breast cancer ([place]) 06/2017   right  . Bruises easily   . Dental crowns present   . History of diverticulitis   . Hypertension    states under control with med., has been on med. x 5 yr.  . 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 Seasonal allergies     Past Surgical History:  Procedure Laterality Date  . ABDOMINAL HYSTERECTOMY     partial  . BREAST LUMPECTOMY WITH RADIOACTIVE SEED AND SENTINEL LYMPH NODE BIOPSY Right 06/22/2017   Procedure: RIGHT BREAST LUMPECTOMY WITH RADIOACTIVE SEED AND RIGHT AXILLARY DEEP SENTINEL LYMPH NODE BIOPSY WITH BLUE DYE INJECTION;  Surgeon: Fanny Skates, MD;  Location: [facility];  Service: General;  Laterality: Right;  . BREAST LUMPECTOMY WITH RADIOACTIVE SEED LOCALIZATION Left 06/22/2017   Procedure: LEFT BREAST LUMPECTOMY WITH RADIOACTIVE SEED LOCALIZATION;  Surgeon: Fanny Skates, MD;  Location: [facility];  Service: General;  Laterality: Left;  . CATARACT EXTRACTION W/ INTRAOCULAR LENS  IMPLANT, BILATERAL Bilateral   . TONSILLECTOMY     age 31    There were no vitals filed for this visit.  Subjective Assessment - 11/15/17 1308    Subjective  Pt states she was doing well until she had to  turn the steering wheel and pulled right axilla     Pertinent History  right breast cancer with lumpectomy with 3 deep nodes removed and left breast sclerotic lesion with lumpectomy on 06/22/2017 followed by radition completed 09/27/2017 ( no chemotherapy)  Husband is now undergoing treatment for head and neck cancer and is having a difficulty time.  Pt has developed pain and limited range of motion of right shoulder     Patient Stated Goals  to get rid of pain and use her right arm normally     Currently in Pain?  Yes    Pain Score  5     Pain Orientation  Right    Pain Descriptors / Indicators  Aching    Pain Type  Acute pain                      [facility] Adult PT Treatment/Exercise - 11/15/17 0001      Shoulder Exercises: Supine   Other Supine Exercises  purple ball under thoracic spine for anterior chest stretch  and also sacrum for core lower core strech     Other Supine Exercises  supine scapular series with yellow theraband x 5 reps       Shoulder Exercises: Pulleys   Flexion  2 minutes    ABduction  2 minutes  Shoulder Exercises: ROM/Strengthening   Ball on Wall  up into flexion with both hands on ball with extra stretch at the top     Other ROM/Strengthening Exercises  right forearm on ball for right lateral trunk stretch     Other ROM/Strengthening Exercises  low rows with elbow straight with yellow theraband x 10 reps       Shoulder Exercises: Stretch   Other Shoulder Stretches  back up against the wall for scapular retraction and anterior chest openeing.  Pt needed significant cueing to keep scapula down away from ears and to relax neck and shoulders  added purple ball at thoracic spine for more stretch .       Manual Therapy   Manual Therapy  Soft tissue mobilization;Myofascial release    Soft tissue mobilization  with biotone in in supine and left sidelying to lateral chest near axilla and scapular border  siginificant persistent rigger point in  axillasubscapularis                   PT Long Term Goals - 11/06/17 1758      PT LONG TERM GOAL #1   Title  Pt will report the pain in her right upper quadrant is decreased so that she can turn the steering wheel without pain while she is driving     Time  4    Period  Weeks    Status  New      PT LONG TERM GOAL #2   Title  Pt will be independent in a home exercise program     Time  4    Period  Weeks    Status  New      PT LONG TERM GOAL #3   Title  Pt will have 150 degrees of right shoulder abduction so that she can perform her household activities easier     Baseline  102 on eval     Time  4    Period  Weeks    Status  New            Plan - 11/15/17 1701    Clinical Impression Statement  Pt reports she is doing better.  Upgraded exercise for range of motion and strength and added to HEP. She continues to have some tightness in her right lateral chest and shoulder     Rehab Potential  Good    Clinical Impairments Affecting Rehab Potential  3 deep nodes removed on right side, radiation to right upper quadrant     PT Frequency  2x / week    PT Duration  4 weeks    PT Treatment/Interventions  ADLs/Self Care Home Management;Compression bandaging;Scar mobilization;Passive range of motion;Patient/family education;Therapeutic exercise;Therapeutic activities;Manual techniques;Taping;Orthotic Fit/Training;DME Instruction    PT Next Visit Plan    Assess effects of  new exercise and continue  pulley, wall stretches, A/AA/ PROM, to right shoulder and scapula, myofascial techniqus and soft tissue work to right axilla and lateral scapula     PT Home Exercise Plan  supine dowel for shoulder flexion and abduction , Meeks decompresssion , standing posture and scapular retraction  supine scapular series     Consulted and Agree with Plan of Care  Patient       Patient will benefit from skilled therapeutic intervention in order to improve the following deficits and impairments:   Decreased scar mobility, Decreased knowledge of precautions, Decreased knowledge of use of DME, Decreased strength, Increased fascial restricitons, Impaired UE functional use,  Pain, Decreased range of motion, Impaired perceived functional ability, Postural dysfunction  Visit Diagnosis: Abnormal posture  Disorder of the skin and subcutaneous tissue related to radiation, unspecified  Acute pain of right shoulder  Stiffness of right shoulder, not elsewhere classified     Problem List Patient Active Problem List   Diagnosis Date Noted  . Malignant neoplasm of upper-outer quadrant of right breast in female, estrogen receptor positive ([place]) 05/22/2017  . Diplopia 09/17/2014  . Dizziness and giddiness 09/17/2014  . Essential hypertension 07/22/2014  . Chest pain 07/22/2014  . Family history of heart disease 07/22/2014   Donato Heinz. Owens Shark PT  [place] Levo 11/15/2017, 5:04 PM  [place], [place], [address] Phone: [phone]   Fax:  [phone]  Name: Angela Buchanan MRN: [id number] Date of Birth: Sep 09, 1951

## 2017-11-16 ENCOUNTER — Encounter: Payer: Medicare Other | Admitting: Physical Therapy

## 2017-11-20 ENCOUNTER — Ambulatory Visit: Payer: Medicare Other | Admitting: Physical Therapy

## 2017-11-20 ENCOUNTER — Encounter: Payer: Self-pay | Admitting: Physical Therapy

## 2017-11-20 ENCOUNTER — Other Ambulatory Visit: Payer: Medicare Other

## 2017-11-20 DIAGNOSIS — R293 Abnormal posture: Secondary | ICD-10-CM

## 2017-11-20 DIAGNOSIS — M25611 Stiffness of right shoulder, not elsewhere classified: Secondary | ICD-10-CM

## 2017-11-20 DIAGNOSIS — L599 Disorder of the skin and subcutaneous tissue related to radiation, unspecified: Secondary | ICD-10-CM

## 2017-11-20 DIAGNOSIS — M25511 Pain in right shoulder: Secondary | ICD-10-CM

## 2017-11-20 NOTE — Therapy (Signed)
[place], [place], [address] Phone: [phone]   Fax:  [phone]  Physical Therapy Treatment  Patient Details  Name: Angela Buchanan MRN: [id number] Date of Birth: [birth year] Referring Provider: Shona Simpson    Encounter Date: 11/20/2017  PT End of Session - 11/20/17 1635    Visit Number  5    Number of Visits  9    Date for PT Re-Evaluation  12/07/17    PT Start Time  1430    PT Stop Time  1515    PT Time Calculation (min)  45 min       Past Medical History:  Diagnosis Date  . Bigeminy    no current med.  . Breast cancer ([place]) 06/2017   right  . Bruises easily   . Dental crowns present   . History of diverticulitis   . Hypertension    states under control with med., has been on med. x 5 yr.  . 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 Seasonal allergies     Past Surgical History:  Procedure Laterality Date  . ABDOMINAL HYSTERECTOMY     partial  . BREAST LUMPECTOMY WITH RADIOACTIVE SEED AND SENTINEL LYMPH NODE BIOPSY Right 06/22/2017   Procedure: RIGHT BREAST LUMPECTOMY WITH RADIOACTIVE SEED AND RIGHT AXILLARY DEEP SENTINEL LYMPH NODE BIOPSY WITH BLUE DYE INJECTION;  Surgeon: Fanny Skates, MD;  Location: [facility];  Service: General;  Laterality: Right;  . BREAST LUMPECTOMY WITH RADIOACTIVE SEED LOCALIZATION Left 06/22/2017   Procedure: LEFT BREAST LUMPECTOMY WITH RADIOACTIVE SEED LOCALIZATION;  Surgeon: Fanny Skates, MD;  Location: [facility];  Service: General;  Laterality: Left;  . CATARACT EXTRACTION W/ INTRAOCULAR LENS  IMPLANT, BILATERAL Bilateral   . TONSILLECTOMY     age 33    There were no vitals filed for this visit.  Subjective Assessment - 11/20/17 1440    Subjective  Pt states she was fine Friday and Saturday but on Sunday she hurt all the way down to her fingers.  she still has some tightness in her axilla     Pertinent History   right breast cancer with lumpectomy with 3 deep nodes removed and left breast sclerotic lesion with lumpectomy on 06/22/2017 followed by radition completed 09/27/2017 ( no chemotherapy)  Husband is now undergoing treatment for head and neck cancer and is having a difficulty time.  Pt has developed pain and limited range of motion of right shoulder     Patient Stated Goals  to get rid of pain and use her right arm normally     Currently in Pain?  Yes    Pain Score  4     Pain Location  Shoulder    Pain Type  Acute pain                      [facility] Adult PT Treatment/Exercise - 11/20/17 0001      Shoulder Exercises: Sidelying   ABduction  AROM;Right overhead stretch with deep breath at the top     Other Sidelying Exercises  small circles in each direction       Shoulder Exercises: Standing   Protraction  AROM;Right;10 reps      Shoulder Exercises: Pulleys   Flexion  2 minutes verbal cues to keep shoulders down     ABduction  2 minutes      Shoulder Exercises: Stretch   Other Shoulder Stretches  back  up against the wall for scapular retraction and anterior chest openeing.  Pt needed significant cueing to keep scapula down away from ears and to relax neck and shoulders  added purple ball at thoracic spine for more stretch .     Other Shoulder Stretches  hand low on doorframe and turn away to stretch the front of each shoulder       Manual Therapy   Manual Therapy  Soft tissue mobilization;Myofascial release    Soft tissue mobilization  with biotone in in supine and left sidelying to lateral chest near axilla and scapular border  siginificant persistent rigger point in axillasubscapularis     Myofascial Release  prolonged pressure to tender trigger points at lateral chest near axilla                   PT Long Term Goals - 11/06/17 1758      PT LONG TERM GOAL #1   Title  Pt will report the pain in her right upper quadrant is decreased so that she can turn the  steering wheel without pain while she is driving     Time  4    Period  Weeks    Status  New      PT LONG TERM GOAL #2   Title  Pt will be independent in a home exercise program     Time  4    Period  Weeks    Status  New      PT LONG TERM GOAL #3   Title  Pt will have 150 degrees of right shoulder abduction so that she can perform her household activities easier     Baseline  102 on eval     Time  4    Period  Weeks    Status  New            Plan - 11/20/17 1635    Clinical Impression Statement  Pt continues to do better with less pain, but has persistent trigger points in left posterior scapula.  She gets relief with soft tissue work and feels better at end of session     Rehab Potential  Good    Clinical Impairments Affecting Rehab Potential  3 deep nodes removed on right side, radiation to right upper quadrant     PT Frequency  2x / week    PT Treatment/Interventions  ADLs/Self Care Home Management;Compression bandaging;Scar mobilization;Passive range of motion;Patient/family education;Therapeutic exercise;Therapeutic activities;Manual techniques;Taping;Orthotic Fit/Training;DME Instruction    PT Next Visit Plan  review supine scapular series  and continue  pulley, wall stretches, A/AA/ PROM, to right shoulder and scapula, myofascial techniqus and soft tissue work to right axilla and lateral scapula     Consulted and Agree with Plan of Care  Patient       Patient will benefit from skilled therapeutic intervention in order to improve the following deficits and impairments:  Decreased scar mobility, Decreased knowledge of precautions, Decreased knowledge of use of DME, Decreased strength, Increased fascial restricitons, Impaired UE functional use, Pain, Decreased range of motion, Impaired perceived functional ability, Postural dysfunction  Visit Diagnosis: Abnormal posture  Disorder of the skin and subcutaneous tissue related to radiation, unspecified  Acute pain of right  shoulder  Stiffness of right shoulder, not elsewhere classified     Problem List Patient Active Problem List   Diagnosis Date Noted  . Malignant neoplasm of upper-outer quadrant of right breast in female, estrogen receptor positive ([place]) 05/22/2017  .  Diplopia 09/17/2014  . Dizziness and giddiness 09/17/2014  . Essential hypertension 07/22/2014  . Chest pain 07/22/2014  . Family history of heart disease 07/22/2014   Donato Heinz. Owens Shark PT  [place] Levo 11/20/2017, 4:37 PM  [place], [place], [address] Phone: [phone]   Fax:  [phone]  Name: ANIYIAH ZELL MRN: [id number] Date of Birth: 1950/12/03

## 2017-11-21 ENCOUNTER — Other Ambulatory Visit: Payer: Medicare Other

## 2017-11-23 ENCOUNTER — Ambulatory Visit: Payer: Medicare Other | Admitting: Physical Therapy

## 2017-11-23 ENCOUNTER — Encounter: Payer: Self-pay | Admitting: Physical Therapy

## 2017-11-23 DIAGNOSIS — M25611 Stiffness of right shoulder, not elsewhere classified: Secondary | ICD-10-CM

## 2017-11-23 DIAGNOSIS — M25511 Pain in right shoulder: Secondary | ICD-10-CM

## 2017-11-23 DIAGNOSIS — R293 Abnormal posture: Secondary | ICD-10-CM | POA: Diagnosis not present

## 2017-11-23 DIAGNOSIS — L599 Disorder of the skin and subcutaneous tissue related to radiation, unspecified: Secondary | ICD-10-CM

## 2017-11-23 NOTE — Patient Instructions (Signed)

## 2017-11-23 NOTE — Therapy (Signed)
[place], [place], [address] Phone: [phone]   Fax:  [phone]  Physical Therapy Treatment  Patient Details  Name: Angela Buchanan MRN: [id number] Date of Birth: 1951/09/20 Referring Provider: Shona Simpson    Encounter Date: 11/23/2017  PT End of Session - 11/23/17 1118    Visit Number  6    Number of Visits  9    Date for PT Re-Evaluation  12/07/17    PT Start Time  1020    PT Stop Time  1110    PT Time Calculation (min)  50 min    Activity Tolerance  Patient tolerated treatment well    Behavior During Therapy  [facility] for tasks assessed/performed       Past Medical History:  Diagnosis Date  . Bigeminy    no current med.  . Breast cancer ([place]) 06/2017   right  . Bruises easily   . Dental crowns present   . History of diverticulitis   . Hypertension    states under control with med., has been on med. x 5 yr.  . 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 Seasonal allergies     Past Surgical History:  Procedure Laterality Date  . ABDOMINAL HYSTERECTOMY     partial  . BREAST LUMPECTOMY WITH RADIOACTIVE SEED AND SENTINEL LYMPH NODE BIOPSY Right 06/22/2017   Procedure: RIGHT BREAST LUMPECTOMY WITH RADIOACTIVE SEED AND RIGHT AXILLARY DEEP SENTINEL LYMPH NODE BIOPSY WITH BLUE DYE INJECTION;  Surgeon: Fanny Skates, MD;  Location: [facility];  Service: General;  Laterality: Right;  . BREAST LUMPECTOMY WITH RADIOACTIVE SEED LOCALIZATION Left 06/22/2017   Procedure: LEFT BREAST LUMPECTOMY WITH RADIOACTIVE SEED LOCALIZATION;  Surgeon: Fanny Skates, MD;  Location: [place];  Service: General;  Laterality: Left;  . CATARACT EXTRACTION W/ INTRAOCULAR LENS  IMPLANT, BILATERAL Bilateral   . TONSILLECTOMY     age 67    There were no vitals filed for this visit.      [facility] PT Assessment - 11/23/17 0001      AROM   Right Shoulder ABduction  155 Degrees                   [facility] Adult PT Treatment/Exercise - 11/23/17 0001      Shoulder Exercises: Supine   Other Supine Exercises  dowel rod stretching x 10 reps       Shoulder Exercises: Standing   Row  Strengthening;Right;Left;20 reps;Theraband elbows straight     Theraband Level (Shoulder Row)  Level 2 (Red)      Shoulder Exercises: Pulleys   Flexion  2 minutes verbal cues to keep shoulders down     ABduction  -- Pt is able to do this more easily       Shoulder Exercises: ROM/Strengthening   Ball on Wall  up into flexion with both hands on ball with extra stretch at the top       Shoulder Exercises: Isometric Strengthening   Flexion  3X5"    Extension  3X5"    ABduction  3X5"    ABduction Limitations  needed extra verbal and tacitile cues for positon and performance with pt using fingers of opposite hand on shoulder to feel muscle contraction              PT Education - 11/23/17 1115    Education provided  Yes    Education Details  shoulder isometrics and low row  Person(s) Educated  Patient    Methods  Explanation;Demonstration;Handout    Comprehension  Verbalized understanding;Returned demonstration          PT Long Term Goals - 11/23/17 1122      PT LONG TERM GOAL #1   Title  Pt will report the pain in her right upper quadrant is decreased so that she can turn the steering wheel without pain while she is driving     Time  4    Period  Weeks    Status  On-going      PT LONG TERM GOAL #2   Title  Pt will be independent in a home exercise program     Time  4    Period  Weeks    Status  On-going      PT LONG TERM GOAL #3   Title  Pt will have 150 degrees of right shoulder abduction so that she can perform her household activities easier     Baseline  102 on eval, 155 on 11/23/2017    Status  Achieved            Plan - 11/23/17 1119    Clinical Impression Statement  Pt reports continued improvement in symptoms.  Upgraded home program today  with isometrics and low rows with red theraband She continues to have tightness in right posterior shoulder and lateral scapular area     Clinical Impairments Affecting Rehab Potential  3 deep nodes removed on right side, radiation to right upper quadrant     PT Treatment/Interventions  ADLs/Self Care Home Management;Compression bandaging;Scar mobilization;Passive range of motion;Patient/family education;Therapeutic exercise;Therapeutic activities;Manual techniques;Taping;Orthotic Fit/Training;DME Instruction    PT Next Visit Plan  review supine scapular series and isometrics  and continue  pulley, wall stretches, A/AA/ PROM, to right shoulder and scapula, myofascial techniqus and soft tissue work to right axilla and lateral scapula     PT Home Exercise Plan  supine dowel for shoulder flexion and abduction , Meeks decompresssion , standing posture and scapular retraction  supine scapular series , shoulder isometrics     Consulted and Agree with Plan of Care  Patient       Patient will benefit from skilled therapeutic intervention in order to improve the following deficits and impairments:  Decreased scar mobility, Decreased knowledge of precautions, Decreased knowledge of use of DME, Decreased strength, Increased fascial restricitons, Impaired UE functional use, Pain, Decreased range of motion, Impaired perceived functional ability, Postural dysfunction  Visit Diagnosis: Abnormal posture  Disorder of the skin and subcutaneous tissue related to radiation, unspecified  Acute pain of right shoulder  Stiffness of right shoulder, not elsewhere classified     Problem List Patient Active Problem List   Diagnosis Date Noted  . Malignant neoplasm of upper-outer quadrant of right breast in female, estrogen receptor positive ([place]) 05/22/2017  . Diplopia 09/17/2014  . Dizziness and giddiness 09/17/2014  . Essential hypertension 07/22/2014  . Chest pain 07/22/2014  . Family history of heart disease  07/22/2014  Donato Heinz. Owens Shark PT   Norwood Levo 11/23/2017, 11:24 AM  [place], [place], [address] Phone: [phone]   Fax:  [phone]  Name: Angela Buchanan MRN: [id number] Date of Birth: 06-01-1951

## 2017-11-27 ENCOUNTER — Ambulatory Visit: Payer: Medicare Other | Admitting: Physical Therapy

## 2017-11-27 ENCOUNTER — Encounter: Payer: Self-pay | Admitting: Physical Therapy

## 2017-11-27 DIAGNOSIS — L599 Disorder of the skin and subcutaneous tissue related to radiation, unspecified: Secondary | ICD-10-CM

## 2017-11-27 DIAGNOSIS — R293 Abnormal posture: Secondary | ICD-10-CM

## 2017-11-27 DIAGNOSIS — M25511 Pain in right shoulder: Secondary | ICD-10-CM

## 2017-11-27 NOTE — Therapy (Signed)
[place], [place], [address] Phone: [phone]   Fax:  [phone]  Physical Therapy Treatment  Patient Details  Name: Angela Buchanan MRN: [id number] Date of Birth: [birth year] Referring Provider: Shona Simpson    Encounter Date: 11/27/2017  PT End of Session - 11/27/17 1729    Visit Number  7    Number of Visits  9    Date for PT Re-Evaluation  12/07/17    PT Start Time  1351    PT Stop Time  1431    PT Time Calculation (min)  40 min    Activity Tolerance  Patient tolerated treatment well    Behavior During Therapy  [facility] for tasks assessed/performed       Past Medical History:  Diagnosis Date  . Bigeminy    no current med.  . Breast cancer ([place]) 06/2017   right  . Bruises easily   . Dental crowns present   . History of diverticulitis   . Hypertension    states under control with med., has been on med. x 5 yr.  . 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 Seasonal allergies     Past Surgical History:  Procedure Laterality Date  . ABDOMINAL HYSTERECTOMY     partial  . BREAST LUMPECTOMY WITH RADIOACTIVE SEED AND SENTINEL LYMPH NODE BIOPSY Right 06/22/2017   Procedure: RIGHT BREAST LUMPECTOMY WITH RADIOACTIVE SEED AND RIGHT AXILLARY DEEP SENTINEL LYMPH NODE BIOPSY WITH BLUE DYE INJECTION;  Surgeon: Fanny Skates, MD;  Location: [facility];  Service: General;  Laterality: Right;  . BREAST LUMPECTOMY WITH RADIOACTIVE SEED LOCALIZATION Left 06/22/2017   Procedure: LEFT BREAST LUMPECTOMY WITH RADIOACTIVE SEED LOCALIZATION;  Surgeon: Fanny Skates, MD;  Location: [facility];  Service: General;  Laterality: Left;  . CATARACT EXTRACTION W/ INTRAOCULAR LENS  IMPLANT, BILATERAL Bilateral   . TONSILLECTOMY     age 67    There were no vitals filed for this visit.  Subjective Assessment - 11/27/17 1354    Subjective  Pt has been rushing to get pt to get fluids.   She found out that her husband has an infection and will need to be on an antibiotic  She noticed that she was able to get dressed easier with less shoulder apin     Pertinent History  right breast cancer with lumpectomy with 3 deep nodes removed and left breast sclerotic lesion with lumpectomy on 06/22/2017 followed by radition completed 09/27/2017 ( no chemotherapy)  Husband is now undergoing treatment for head and neck cancer and is having a difficulty time.  Pt has developed pain and limited range of motion of right shoulder     Patient Stated Goals  to get rid of pain and use her right arm normally     Currently in Pain?  Yes    Pain Score  4     Pain Location  Shoulder    Pain Orientation  Right    Pain Descriptors / Indicators  Aching    Pain Type  Chronic pain    Pain Frequency  Intermittent                      [facility] Adult PT Treatment/Exercise - 11/27/17 0001      Manual Therapy   Soft tissue mobilization  with biotone in in supine and left sidelying to lateral chest near axilla and scapular border  siginificant persistent rigger point  in axillasubscapularis     Myofascial Release  prolonged pressure to tender trigger points at lateral chest near axilla                   PT Long Term Goals - 11/23/17 1122      PT LONG TERM GOAL #1   Title  Pt will report the pain in her right upper quadrant is decreased so that she can turn the steering wheel without pain while she is driving     Time  4    Period  Weeks    Status  On-going      PT LONG TERM GOAL #2   Title  Pt will be independent in a home exercise program     Time  4    Period  Weeks    Status  On-going      PT LONG TERM GOAL #3   Title  Pt will have 150 degrees of right shoulder abduction so that she can perform her household activities easier     Baseline  102 on eval, 155 on 11/23/2017    Status  Achieved            Plan - 11/27/17 1729    Clinical Impression Statement  Pt appears  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  She is doing the exercises at home.  Palpable improvement in soft tissue tightness with soft tissue work today     Rehab Potential  Good    Clinical Impairments Affecting Rehab Potential  3 deep nodes removed on right side, radiation to right upper quadrant     PT Frequency  2x / week    PT Duration  4 weeks    PT Treatment/Interventions  ADLs/Self Care Home Management;Compression bandaging;Scar mobilization;Passive range of motion;Patient/family education;Therapeutic exercise;Therapeutic activities;Manual techniques;Taping;Orthotic Fit/Training;DME Instruction    PT Next Visit Plan  review supine scapular series and isometrics  and continue  pulley, wall stretches, A/AA/ PROM, to right shoulder and scapula, myofascial techniqus and soft tissue work to right axilla and lateral scapula     Consulted and Agree with Plan of Care  Patient       Patient will benefit from skilled therapeutic intervention in order to improve the following deficits and impairments:  Decreased scar mobility, Decreased knowledge of precautions, Decreased knowledge of use of DME, Decreased strength, Increased fascial restricitons, Impaired UE functional use, Pain, Decreased range of motion, Impaired perceived functional ability, Postural dysfunction  Visit Diagnosis: Abnormal posture  Disorder of the skin and subcutaneous tissue related to radiation, unspecified  Acute pain of right shoulder     Problem List Patient Active Problem List   Diagnosis Date Noted  . Malignant neoplasm of upper-outer quadrant of right breast in female, estrogen receptor positive ([place]) 05/22/2017  . Diplopia 09/17/2014  . Dizziness and giddiness 09/17/2014  . Essential hypertension 07/22/2014  . Chest pain 07/22/2014  . Family history of heart disease 07/22/2014   Donato Heinz. Owens Shark PT  Norwood Levo 11/27/2017, 5:31 PM  [place], [place], [address] Phone: [phone]   Fax:  [phone]  Name: Angela Buchanan MRN: [id number] Date of Birth: 10/07/1950

## 2017-11-29 ENCOUNTER — Encounter: Payer: Self-pay | Admitting: Physical Therapy

## 2017-11-29 ENCOUNTER — Ambulatory Visit: Payer: Medicare Other | Admitting: Physical Therapy

## 2017-11-29 DIAGNOSIS — M25511 Pain in right shoulder: Secondary | ICD-10-CM

## 2017-11-29 DIAGNOSIS — L599 Disorder of the skin and subcutaneous tissue related to radiation, unspecified: Secondary | ICD-10-CM

## 2017-11-29 DIAGNOSIS — R293 Abnormal posture: Secondary | ICD-10-CM

## 2017-11-29 DIAGNOSIS — M25611 Stiffness of right shoulder, not elsewhere classified: Secondary | ICD-10-CM

## 2017-11-29 NOTE — Therapy (Signed)
[place], [place], [address] Phone: [phone]   Fax:  [phone]  Physical Therapy Treatment  Patient Details  Name: Angela Buchanan MRN: [id number] Date of Birth: 25-Mar-1951 Referring Provider: Shona Simpson    Encounter Date: 11/29/2017  PT End of Session - 11/29/17 1435    Visit Number  8    Number of Visits  9    Date for PT Re-Evaluation  12/07/17    PT Start Time  9767    PT Stop Time  1430    PT Time Calculation (min)  45 min    Activity Tolerance  Patient tolerated treatment well    Behavior During Therapy  [facility] for tasks assessed/performed       Past Medical History:  Diagnosis Date  . Bigeminy    no current med.  . Breast cancer ([place]) 06/2017   right  . Bruises easily   . Dental crowns present   . History of diverticulitis   . Hypertension    states under control with med., has been on med. x 5 yr.  . 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 Seasonal allergies     Past Surgical History:  Procedure Laterality Date  . ABDOMINAL HYSTERECTOMY     partial  . BREAST LUMPECTOMY WITH RADIOACTIVE SEED AND SENTINEL LYMPH NODE BIOPSY Right 06/22/2017   Procedure: RIGHT BREAST LUMPECTOMY WITH RADIOACTIVE SEED AND RIGHT AXILLARY DEEP SENTINEL LYMPH NODE BIOPSY WITH BLUE DYE INJECTION;  Surgeon: Fanny Skates, MD;  Location: [facility];  Service: General;  Laterality: Right;  . BREAST LUMPECTOMY WITH RADIOACTIVE SEED LOCALIZATION Left 06/22/2017   Procedure: LEFT BREAST LUMPECTOMY WITH RADIOACTIVE SEED LOCALIZATION;  Surgeon: Fanny Skates, MD;  Location: [facility];  Service: General;  Laterality: Left;  . CATARACT EXTRACTION W/ INTRAOCULAR LENS  IMPLANT, BILATERAL Bilateral   . TONSILLECTOMY     age 67    There were no vitals filed for this visit.  Subjective Assessment - 11/29/17 1347    Subjective  Pt states she is good.  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  Pt has developed pain and limited range of motion of right shoulder     Patient Stated Goals  to get rid of pain and use her right arm normally     Currently in Pain?  No/denies                      [facility] Adult PT Treatment/Exercise - 11/29/17 0001      Shoulder Exercises: Standing   External Rotation  Strengthening;Right;10 reps;Theraband    Theraband Level (Shoulder External Rotation)  Level 2 (Red)    Internal Rotation  Strengthening;Right;10 reps;Theraband    Theraband Level (Shoulder Internal Rotation)  Level 2 (Red)    Flexion  Strengthening;Right;10 reps;Theraband    Theraband Level (Shoulder Flexion)  Level 2 (Red)    Extension  Strengthening;Right;10 reps;Theraband    Theraband Level (Shoulder Extension)  Level 2 (Red)    Row  Strengthening;Right;Left;10 reps    Theraband Level (Shoulder Row)  Level 3 (Green)  Shoulder Exercises: Pulleys   Flexion  2 minutes verbal cues to keep shoulders down     ABduction  2 minutes      Shoulder Exercises: ROM/Strengthening   Wall Wash  with towel in all direction     Ball on Wall  up into flexion with both hands on ball with extra stretch at the top       Manual Therapy   Soft tissue mobilization  with biotone in in supine and left sidelying to lateral chest near axilla and scapular border  siginificant persistent rigger point in axillasubscapularis     Myofascial Release  prolonged pressure to tender trigger points at lateral chest near axilla              PT Education - 11/29/17 1434    Education Details  rockwood series with red theraband, low rows     Person(s) Educated   Patient    Methods  Explanation;Demonstration;Handout    Comprehension  Verbalized understanding;Returned demonstration          PT Long Term Goals - 11/23/17 1122      PT LONG TERM GOAL #1   Title  Pt will report the pain in her right upper quadrant is decreased so that she can turn the steering wheel without pain while she is driving     Time  4    Period  Weeks    Status  On-going      PT LONG TERM GOAL #2   Title  Pt will be independent in a home exercise program     Time  4    Period  Weeks    Status  On-going      PT LONG TERM GOAL #3   Title  Pt will have 150 degrees of right shoulder abduction so that she can perform her household activities easier     Baseline  102 on eval, 155 on 11/23/2017    Status  Achieved            Plan - 11/29/17 1619    Clinical Impression Statement  Pt is doing better and notices that she can do much more with her arm than she could before.  Upgraded strengthening.  Pt still has some tightness in right posterior axilla, but it appears to be better overall.     Clinical Impairments Affecting Rehab Potential  3 deep nodes removed on right side, radiation to right upper quadrant     PT Frequency  2x / week    PT Duration  4 weeks    PT Next Visit Plan  Assess for goals and discharge or renew as needed review rockwoods,  supine scapular series and isometrics  and continue  pulley, wall stretches,as needed  A/AA/ PROM, to right shoulder and scapula, myofascial techniqus and soft tissue work to right axilla and lateral scapula     PT Home Exercise Plan  supine dowel for shoulder flexion and abduction , Meeks decompresssion , standing posture and scapular retraction  supine scapular series , shoulder isometrics        Patient will benefit from skilled therapeutic intervention in order to improve the following deficits and impairments:  Decreased scar mobility, Decreased knowledge of precautions, Decreased knowledge of use of DME, Decreased  strength, Increased fascial restricitons, Impaired UE functional use, Pain, Decreased range of motion, Impaired perceived functional ability, Postural dysfunction  Visit Diagnosis: Abnormal posture  Disorder of the skin and subcutaneous tissue related to radiation, unspecified  Acute pain of right shoulder  Stiffness of right shoulder, not elsewhere classified     Problem List Patient Active Problem List   Diagnosis Date Noted  . Malignant neoplasm of upper-outer quadrant of right breast in female, estrogen receptor positive ([place]) 05/22/2017  . Diplopia 09/17/2014  . Dizziness and giddiness 09/17/2014  . Essential hypertension 07/22/2014  . Chest pain 07/22/2014  . Family history of heart disease 07/22/2014   Donato Heinz. Owens Shark PT  [place] Levo 11/29/2017, 4:22 PM  [place], [place], [address] Phone: [phone]   Fax:  [phone]  Name: PAILYNN Buchanan MRN: [id number] Date of Birth: October 10, 1950

## 2017-11-29 NOTE — Patient Instructions (Signed)
Strengthening: Resisted Flexion   Cancer Rehab [phone]    Hold tubing with right arm at side. Pull forward and up. Move shoulder through pain-free range of motion. Repeat __5-10__ times per set. Do _1-2___ sessions per day.  Strengthening: Resisted Internal Rotation    Hold tubing in right hand, elbow at side and forearm out. Rotate forearm in across body. Repeat __5-10__ times per set. Do _1-2___ sessions per day.  Strengthening: Resisted Extension    Hold tubing in right hand, arm forward. Pull arm back, elbow straight. Repeat _5-10___ times per set. Do _1-2___ sessions per day.  Strengthening: Resisted External Rotation    Hold tubing in right hand, elbow at side and forearm across body. Rotate forearm out. Repeat _5-10___ times per set. Do __1-2__ sessions per day.     

## 2017-12-05 ENCOUNTER — Ambulatory Visit: Payer: Medicare Other | Attending: Radiation Oncology

## 2017-12-05 DIAGNOSIS — R293 Abnormal posture: Secondary | ICD-10-CM | POA: Insufficient documentation

## 2017-12-05 DIAGNOSIS — M25611 Stiffness of right shoulder, not elsewhere classified: Secondary | ICD-10-CM | POA: Diagnosis present

## 2017-12-05 DIAGNOSIS — L599 Disorder of the skin and subcutaneous tissue related to radiation, unspecified: Secondary | ICD-10-CM | POA: Diagnosis present

## 2017-12-05 DIAGNOSIS — M25511 Pain in right shoulder: Secondary | ICD-10-CM

## 2017-12-05 NOTE — Therapy (Signed)
[place], [place], [address] Phone: [phone]   Fax:  [phone]  Physical Therapy Treatment  Patient Details  Name: Angela Buchanan MRN: [id number] Date of Birth: December 12, 1950 Referring Provider: Shona Simpson    Encounter Date: 12/05/2017  PT End of Session - 12/05/17 1606    Visit Number  9    Number of Visits  9    Date for PT Re-Evaluation  12/07/17    PT Start Time  1520    PT Stop Time  1605    PT Time Calculation (min)  45 min    Activity Tolerance  Patient tolerated treatment well    Behavior During Therapy  [facility] for tasks assessed/performed       Past Medical History:  Diagnosis Date  . Bigeminy    no current med.  . Breast cancer ([place]) 06/2017   right  . Bruises easily   . Dental crowns present   . History of diverticulitis   . Hypertension    states under control with med., has been on med. x 5 yr.  . 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 Seasonal allergies     Past Surgical History:  Procedure Laterality Date  . ABDOMINAL HYSTERECTOMY     partial  . BREAST LUMPECTOMY WITH RADIOACTIVE SEED AND SENTINEL LYMPH NODE BIOPSY Right 06/22/2017   Procedure: RIGHT BREAST LUMPECTOMY WITH RADIOACTIVE SEED AND RIGHT AXILLARY DEEP SENTINEL LYMPH NODE BIOPSY WITH BLUE DYE INJECTION;  Surgeon: Fanny Skates, MD;  Location: [place];  Service: General;  Laterality: Right;  . BREAST LUMPECTOMY WITH RADIOACTIVE SEED LOCALIZATION Left 06/22/2017   Procedure: LEFT BREAST LUMPECTOMY WITH RADIOACTIVE SEED LOCALIZATION;  Surgeon: Fanny Skates, MD;  Location: [place];  Service: General;  Laterality: Left;  . CATARACT EXTRACTION W/ INTRAOCULAR LENS  IMPLANT, BILATERAL Bilateral   . TONSILLECTOMY     age 51    There were no vitals filed for this visit.  Subjective Assessment - 12/05/17 1523    Subjective  Been doing my HEP, but I have a couple  questions about the [place] ones. Biggest c/o is I still can not sleep on my Rt side. If I end up on it then I wake up in pain.     Pertinent History  right breast cancer with lumpectomy with 3 deep nodes removed and left breast sclerotic lesion with lumpectomy on 06/22/2017 followed by radition completed 09/27/2017 ( no chemotherapy)  Husband is now undergoing treatment for head and neck cancer and is having a difficulty time.  Pt has developed pain and limited range of motion of right shoulder     Patient Stated Goals  to get rid of pain and use her right arm normally     Currently in Pain?  No/denies         [facility] PT Assessment - 12/05/17 0001      AROM   Right Shoulder Flexion  152 Degrees    Right Shoulder ABduction  156 Degrees                  [facility] Adult PT Treatment/Exercise - 12/05/17 0001      Shoulder Exercises: Standing   External Rotation  Strengthening;Right;Theraband;5 reps    Theraband Level (Shoulder External Rotation)  Level 2 (Red)    Internal Rotation  Strengthening;Right;Theraband;5 reps    Theraband Level (Shoulder Internal Rotation)  Level 2 (Red)    Flexion  Strengthening;Right;Theraband;5 reps    Theraband Level (Shoulder Flexion)  Level 2 (Red)    Extension  Strengthening;Right;Theraband;5 reps    Theraband Level (Shoulder Extension)  Level 2 (Red)    Row  Strengthening;Right;Left;5 reps    Theraband Level (Shoulder Row)  Level 2 (Red)      Manual Therapy   Manual Therapy  Passive ROM;Soft tissue mobilization;Myofascial release    Soft tissue mobilization  with biotone in supine to lateral chest near axilla and scapular border     Myofascial Release  prolonged pressure to tender trigger points at lateral chest near axilla     Passive ROM  In Supine to Rt shoulder into flexion, abduction and D2 to assist with myofascial release and trigger point release                  PT Long Term Goals - 11/23/17 1122      PT LONG TERM GOAL #1    Title  Pt will report the pain in her right upper quadrant is decreased so that she can turn the steering wheel without pain while she is driving     Time  4    Period  Weeks    Status  On-going      PT LONG TERM GOAL #2   Title  Pt will be independent in a home exercise program     Time  4    Period  Weeks    Status  On-going      PT LONG TERM GOAL #3   Title  Pt will have 150 degrees of right shoulder abduction so that she can perform her household activities easier     Baseline  102 on eval, 155 on 11/23/2017    Status  Achieved            Plan - 12/05/17 1606    Clinical Impression Statement  Pt cotniues to show excellent progress. Her A/ROM has improved well since last time measured and she is mostly independent with HEP. Required review today with [place] and minor cuing for correct technqiue but she tolreated this well. Her end ROM is still limited and cording, though improved from previous notes, is still palpable. Pt would benefit from continued therapy to help pt attain end ROM and to further decrease cording and trigger points along lateral border of Rt scapula that are interferring with pts sleep.      Rehab Potential  Good    Clinical Impairments Affecting Rehab Potential  3 deep nodes removed on right side, radiation to right upper quadrant     PT Frequency  2x / week    PT Duration  4 weeks    PT Treatment/Interventions  ADLs/Self Care Home Management;Compression bandaging;Scar mobilization;Passive range of motion;Patient/family education;Therapeutic exercise;Therapeutic activities;Manual techniques;Taping;Orthotic Fit/Training;DME Instruction    PT Next Visit Plan  Assess for goals and renew (pt would like to cont 1x/up to 4 wks) review rockwoods prn and cotnieu with focus on end ROM/decreasing cording and trigger points with manual therapies    Consulted and Agree with Plan of Care  Patient       Patient will benefit from skilled therapeutic intervention in  order to improve the following deficits and impairments:  Decreased scar mobility, Decreased knowledge of precautions, Decreased knowledge of use of DME, Decreased strength, Increased fascial restricitons, Impaired UE functional use, Pain, Decreased range of motion, Impaired perceived functional ability, Postural dysfunction  Visit Diagnosis: Abnormal posture  Disorder of the skin  and subcutaneous tissue related to radiation, unspecified  Acute pain of right shoulder  Stiffness of right shoulder, not elsewhere classified     Problem List Patient Active Problem List   Diagnosis Date Noted  . Malignant neoplasm of upper-outer quadrant of right breast in female, estrogen receptor positive ([place]) 05/22/2017  . Diplopia 09/17/2014  . Dizziness and giddiness 09/17/2014  . Essential hypertension 07/22/2014  . Chest pain 07/22/2014  . Family history of heart disease 07/22/2014    Otelia Limes, PTA 12/05/2017, 4:10 PM  [place], [place], [address] Phone: [phone]   Fax:  [phone]  Name: Angela Buchanan MRN: [id number] Date of Birth: [birth year]

## 2017-12-06 ENCOUNTER — Encounter: Payer: Medicare Other | Admitting: Physical Therapy

## 2017-12-11 ENCOUNTER — Encounter: Payer: Medicare Other | Admitting: Physical Therapy

## 2017-12-13 ENCOUNTER — Ambulatory Visit: Payer: Medicare Other | Admitting: Physical Therapy

## 2017-12-13 ENCOUNTER — Encounter: Payer: Self-pay | Admitting: Physical Therapy

## 2017-12-13 DIAGNOSIS — L599 Disorder of the skin and subcutaneous tissue related to radiation, unspecified: Secondary | ICD-10-CM

## 2017-12-13 DIAGNOSIS — R293 Abnormal posture: Secondary | ICD-10-CM | POA: Diagnosis not present

## 2017-12-13 DIAGNOSIS — M25511 Pain in right shoulder: Secondary | ICD-10-CM

## 2017-12-13 DIAGNOSIS — M25611 Stiffness of right shoulder, not elsewhere classified: Secondary | ICD-10-CM

## 2017-12-13 NOTE — Therapy (Signed)
[place], [place], [address] Phone: [phone]   Fax:  [phone]  Physical Therapy Treatment  Patient Details  Name: Angela Buchanan MRN: [id number] Date of Birth: May 01, 1951 Referring Provider: Shona Simpson    Encounter Date: 12/13/2017  PT End of Session - 12/13/17 1447    Visit Number  10    Number of Visits  17    Date for PT Re-Evaluation  01/14/18    PT Start Time  7741    PT Stop Time  1430    PT Time Calculation (min)  45 min    Activity Tolerance  Patient tolerated treatment well    Behavior During Therapy  [facility] for tasks assessed/performed       Past Medical History:  Diagnosis Date  . Bigeminy    no current med.  . Breast cancer ([place]) 06/2017   right  . Bruises easily   . Dental crowns present   . History of diverticulitis   . Hypertension    states under control with med., has been on med. x 5 yr.  . 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 Seasonal allergies     Past Surgical History:  Procedure Laterality Date  . ABDOMINAL HYSTERECTOMY     partial  . BREAST LUMPECTOMY WITH RADIOACTIVE SEED AND SENTINEL LYMPH NODE BIOPSY Right 06/22/2017   Procedure: RIGHT BREAST LUMPECTOMY WITH RADIOACTIVE SEED AND RIGHT AXILLARY DEEP SENTINEL LYMPH NODE BIOPSY WITH BLUE DYE INJECTION;  Surgeon: Fanny Skates, MD;  Location: [place];  Service: General;  Laterality: Right;  . BREAST LUMPECTOMY WITH RADIOACTIVE SEED LOCALIZATION Left 06/22/2017   Procedure: LEFT BREAST LUMPECTOMY WITH RADIOACTIVE SEED LOCALIZATION;  Surgeon: Fanny Skates, MD;  Location: [place];  Service: General;  Laterality: Left;  . CATARACT EXTRACTION W/ INTRAOCULAR LENS  IMPLANT, BILATERAL Bilateral   . TONSILLECTOMY     age 67    There were no vitals filed for this visit.  Subjective Assessment - 12/13/17 1348    Subjective  Pt states "things are looking up"   [name]  reprots has made significant improvement from when she first came but she feels she still is having some pain in her right posteior axilla and is not able to do all the things she used to do because her arm gets tired.  She wants to keep coming twice a week to realy work on increasing her strength  Pt states she always feels better when she leaves PT     Pertinent History  right breast cancer with lumpectomy with 3 deep nodes removed and left breast sclerotic lesion with lumpectomy on 06/22/2017 followed by radition completed 09/27/2017 ( no chemotherapy)  Husband is now undergoing treatment for head and neck cancer and is having a difficulty time.  Pt has developed pain and limited range of motion of right shoulder     Patient Stated Goals  to get rid of pain and use her right arm normally     Currently in Pain?  Yes    Pain Score  2     Pain Location  Shoulder    Pain Orientation  Right;Posterior    Pain Descriptors / Indicators  Aching    Pain Type  Chronic pain    Pain Radiating Towards  sometimes radiates to arm     Pain Onset  More than a month ago    Pain Frequency  Intermittent    Aggravating  Factors   cannot sleep on right side     Pain Relieving Factors  PT helps          [facility] PT Assessment - 12/13/17 0001      Assessment   Medical Diagnosis  right breast cancer     Referring Provider  Shona Simpson     Onset Date/Surgical Date  06/22/17      Prior Function   Level of Independence  Independent                  [facility] Adult PT Treatment/Exercise - 12/13/17 0001      Shoulder Exercises: Seated   Other Seated Exercises  long red band around shoulders with end in each handl forearm on purple ball to push down and reach other arm forward in scapular protraction execise 10 times with each arm     Other Seated Exercises  standing with theraband under right foot for resisted elbow flexion, shoulder flexion, scaption and abudction 5-10 reps to shoulder height only         Shoulder Exercises: Pulleys   Flexion  2 minutes verbal cues to keep shoulders down     ABduction  2 minutes      Manual Therapy   Manual Therapy  Joint mobilization;Soft tissue mobilization;Myofascial release;Scapular mobilization    Joint Mobilization  passve stretch to posterior gleno-humeral joint     Soft tissue mobilization  with biotone in in supine and left sidelying to lateral chest near axilla and scapular border  some reduction in trigger point at posterior axilla     Scapular Mobilization  to posterior tilt and depression                   PT Long Term Goals - 12/13/17 1349      PT LONG TERM GOAL #1   Title  Pt will report the pain in her right upper quadrant is decreased so that she can turn the steering wheel without pain while she is driving     Status  Achieved      PT LONG TERM GOAL #2   Title  Pt will be independent in a home exercise program     Baseline  Pt is ready to have HEP upgraded     Status  Partially Met      PT LONG TERM GOAL #3   Title  Pt will have 150 degrees of right shoulder abduction so that she can perform her household activities easier     Baseline  102 on eval, 155 on 11/23/2017    Status  Achieved      PT LONG TERM GOAL #4   Title  Pt reports that she wants to strengthen her arm so that she doesn't fatigue as easliy and can do more of the things she used to do before she got sick     Time  4    Period  Weeks    Status  New      PT LONG TERM GOAL #5   Title  Pt will tightness in right posterior shoulder decreased by 75%     Time  4    Period  Weeks    Status  New            Plan - 12/13/17 1448    Clinical Impression Statement  Angela Buchanan reports significant progress but feels that she still has more improvment to make.  She wants to get stronger and still  has persistent trigger points in posterior left shoulder muscles.  Upgraded exercises today and will send for recert for 4 more weeks.     Rehab Potential  Good     Clinical Impairments Affecting Rehab Potential  3 deep nodes removed on right side, radiation to right upper quadrant     PT Frequency  2x / week    PT Duration  4 weeks    PT Treatment/Interventions  ADLs/Self Care Home Management;Compression bandaging;Scar mobilization;Passive range of motion;Patient/family education;Therapeutic exercise;Therapeutic activities;Manual techniques;Taping;Orthotic Fit/Training;DME Instruction    PT Next Visit Plan  Continue with upgrades of scapular strengthening and joint and scapular mobilizaion  with focus on end ROM/decreasing cording and trigger points with manual therapies Ongoing postural work as well.     PT Home Exercise Plan  supine dowel for shoulder flexion and abduction , Meeks decompresssion , standing posture and scapular retraction  supine scapular series , shoulder isometrics  [place], scapular strengthening     Consulted and Agree with Plan of Care  Patient       Patient will benefit from skilled therapeutic intervention in order to improve the following deficits and impairments:  Decreased scar mobility, Decreased knowledge of precautions, Decreased knowledge of use of DME, Decreased strength, Increased fascial restricitons, Impaired UE functional use, Pain, Decreased range of motion, Impaired perceived functional ability, Postural dysfunction  Visit Diagnosis: Abnormal posture - Plan: PT plan of care cert/re-cert  Disorder of the skin and subcutaneous tissue related to radiation, unspecified - Plan: PT plan of care cert/re-cert  Acute pain of right shoulder - Plan: PT plan of care cert/re-cert  Stiffness of right shoulder, not elsewhere classified - Plan: PT plan of care cert/re-cert     Problem List Patient Active Problem List   Diagnosis Date Noted  . Malignant neoplasm of upper-outer quadrant of right breast in female, estrogen receptor positive ([place]) 05/22/2017  . Diplopia 09/17/2014  . Dizziness and giddiness 09/17/2014  .  Essential hypertension 07/22/2014  . Chest pain 07/22/2014  . Family history of heart disease 07/22/2014   Donato Heinz. Owens Shark PT  [place] Levo 12/13/2017, 2:55 PM  [place], [place], [address] Phone: [phone]   Fax:  [phone]  Name: Angela Buchanan MRN: [id number] Date of Birth: [birth year]

## 2017-12-19 ENCOUNTER — Encounter: Payer: Self-pay | Admitting: Physical Therapy

## 2017-12-19 ENCOUNTER — Ambulatory Visit: Payer: Medicare Other | Admitting: Physical Therapy

## 2017-12-19 DIAGNOSIS — R293 Abnormal posture: Secondary | ICD-10-CM

## 2017-12-19 DIAGNOSIS — M25611 Stiffness of right shoulder, not elsewhere classified: Secondary | ICD-10-CM

## 2017-12-19 DIAGNOSIS — L599 Disorder of the skin and subcutaneous tissue related to radiation, unspecified: Secondary | ICD-10-CM

## 2017-12-19 DIAGNOSIS — M25511 Pain in right shoulder: Secondary | ICD-10-CM

## 2017-12-19 NOTE — Therapy (Signed)
[place], [place], [address] Phone: [phone]   Fax:  [phone]  Physical Therapy Treatment  Patient Details  Name: Angela Buchanan MRN: [id number] Date of Birth: 17-Apr-1951 Referring Provider: Shona Simpson    Encounter Date: 12/19/2017  PT End of Session - 12/19/17 1159    Visit Number  11    Number of Visits  17    Date for PT Re-Evaluation  01/14/18    PT Start Time  1100    PT Stop Time  1145    PT Time Calculation (min)  45 min       Past Medical History:  Diagnosis Date  . Bigeminy    no current med.  . Breast cancer ([place]) 06/2017   right  . Bruises easily   . Dental crowns present   . History of diverticulitis   . Hypertension    states under control with med., has been on med. x 5 yr.  . 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 Seasonal allergies     Past Surgical History:  Procedure Laterality Date  . ABDOMINAL HYSTERECTOMY     partial  . BREAST LUMPECTOMY WITH RADIOACTIVE SEED AND SENTINEL LYMPH NODE BIOPSY Right 06/22/2017   Procedure: RIGHT BREAST LUMPECTOMY WITH RADIOACTIVE SEED AND RIGHT AXILLARY DEEP SENTINEL LYMPH NODE BIOPSY WITH BLUE DYE INJECTION;  Surgeon: Fanny Skates, MD;  Location: [place];  Service: General;  Laterality: Right;  . BREAST LUMPECTOMY WITH RADIOACTIVE SEED LOCALIZATION Left 06/22/2017   Procedure: LEFT BREAST LUMPECTOMY WITH RADIOACTIVE SEED LOCALIZATION;  Surgeon: Fanny Skates, MD;  Location: [place];  Service: General;  Laterality: Left;  . CATARACT EXTRACTION W/ INTRAOCULAR LENS  IMPLANT, BILATERAL Bilateral   . TONSILLECTOMY     age 67    There were no vitals filed for this visit.  Subjective Assessment - 12/19/17 1059    Subjective  Pt is doing well today, No pain except for sometimes when she gets up, "that's from age"  She says she feels better every time she leaves here     Pertinent  History  right breast cancer with lumpectomy with 3 deep nodes removed and left breast sclerotic lesion with lumpectomy on 06/22/2017 followed by radition completed 09/27/2017 ( no chemotherapy)  Husband is now undergoing treatment for head and neck cancer and is having a difficulty time.  Pt has developed pain and limited range of motion of right shoulder     Patient Stated Goals  to get rid of pain and use her right arm normally     Currently in Pain?  Yes    Pain Score  3     Pain Location  Axilla    Pain Orientation  Right;Posterior    Pain Descriptors / Indicators  Aching    Pain Type  Chronic pain    Pain Onset  More than a month ago    Pain Frequency  Intermittent    Aggravating Factors   still has trouble vaccuming                       [facility] Adult PT Treatment/Exercise - 12/19/17 0001      Shoulder Exercises: Seated   Other Seated Exercises  towel rolls under hands and "push up to activate scapular muscles, then did it with each hand on purple ball to create and unstable surface       Shoulder Exercises:  Sidelying   Other Sidelying Exercises  "open and close book" for thoracic stretch and chest opening. 5 reps on each side       Shoulder Exercises: Standing   Other Standing Exercises  forearms on foller roller and slide up and down to activate scapular muscles       Shoulder Exercises: Pulleys   Flexion  2 minutes verbal cues to keep shoulders down     ABduction  2 minutes      Manual Therapy   Soft tissue mobilization  with biotone in in supine and left sidelying to lateral chest near axilla and scapular border  some reduction in trigger point at posterior axilla     Myofascial Release  instructed in myofascial self release with tennis ball in doorframe     Scapular Mobilization  to posterior tilt and depression                   PT Long Term Goals - 12/13/17 1349      PT LONG TERM GOAL #1   Title  Pt will report the pain in her right upper  quadrant is decreased so that she can turn the steering wheel without pain while she is driving     Status  Achieved      PT LONG TERM GOAL #2   Title  Pt will be independent in a home exercise program     Baseline  Pt is ready to have HEP upgraded     Status  Partially Met      PT LONG TERM GOAL #3   Title  Pt will have 150 degrees of right shoulder abduction so that she can perform her household activities easier     Baseline  102 on eval, 155 on 11/23/2017    Status  Achieved      PT LONG TERM GOAL #4   Title  Pt reports that she wants to strengthen her arm so that she doesn't fatigue as easliy and can do more of the things she used to do before she got sick     Time  4    Period  Weeks    Status  New      PT LONG TERM GOAL #5   Title  Pt will tightness in right posterior shoulder decreased by 75%     Time  4    Period  Weeks    Status  New            Plan - 12/19/17 1159    Clinical Impression Statement  Pt is doing home exercises and notes functional improvement such as putting jacket on without pain and reaching into the back of the dryer.  She feels better after each treatment, but trigger points persist     Rehab Potential  Good    Clinical Impairments Affecting Rehab Potential  3 deep nodes removed on right side, radiation to right upper quadrant     PT Frequency  2x / week    PT Duration  4 weeks    PT Next Visit Plan  Continue with upgrades of scapular strengthening and joint and scapular mobilizaion  with focus on end ROM/decreasing cording and trigger points with manual therapies Ongoing postural work as well.     PT Home Exercise Plan  supine dowel for shoulder flexion and abduction , Meeks decompresssion , standing posture and scapular retraction  supine scapular series , shoulder isometrics  [place], scapular strengthening , self myofascial  release        Patient will benefit from skilled therapeutic intervention in order to improve the following deficits  and impairments:     Visit Diagnosis: Abnormal posture  Disorder of the skin and subcutaneous tissue related to radiation, unspecified  Acute pain of right shoulder  Stiffness of right shoulder, not elsewhere classified     Problem List Patient Active Problem List   Diagnosis Date Noted  . Malignant neoplasm of upper-outer quadrant of right breast in female, estrogen receptor positive ([place]) 05/22/2017  . Diplopia 09/17/2014  . Dizziness and giddiness 09/17/2014  . Essential hypertension 07/22/2014  . Chest pain 07/22/2014  . Family history of heart disease 07/22/2014   Donato Heinz. Owens Shark PT  [place] Levo 12/19/2017, 12:02 PM  [place], [place], [address] Phone: [phone]   Fax:  [phone]  Name: Angela Buchanan MRN: [id number] Date of Birth: 1951-07-28

## 2017-12-20 ENCOUNTER — Encounter: Payer: Self-pay | Admitting: Adult Health

## 2017-12-20 ENCOUNTER — Inpatient Hospital Stay: Payer: Medicare Other | Attending: Adult Health | Admitting: Adult Health

## 2017-12-20 ENCOUNTER — Ambulatory Visit: Payer: Medicare Other | Admitting: Physical Therapy

## 2017-12-20 VITALS — BP 144/77 | HR 62 | Temp 98.0°F | Resp 18 | Ht 62.0 in | Wt 118.8 lb

## 2017-12-20 DIAGNOSIS — M2550 Pain in unspecified joint: Secondary | ICD-10-CM | POA: Diagnosis not present

## 2017-12-20 DIAGNOSIS — Z7982 Long term (current) use of aspirin: Secondary | ICD-10-CM | POA: Diagnosis not present

## 2017-12-20 DIAGNOSIS — C50411 Malignant neoplasm of upper-outer quadrant of right female breast: Secondary | ICD-10-CM

## 2017-12-20 DIAGNOSIS — Z9221 Personal history of antineoplastic chemotherapy: Secondary | ICD-10-CM | POA: Diagnosis not present

## 2017-12-20 DIAGNOSIS — I82B11 Acute embolism and thrombosis of right subclavian vein: Secondary | ICD-10-CM | POA: Diagnosis not present

## 2017-12-20 DIAGNOSIS — Z79811 Long term (current) use of aromatase inhibitors: Secondary | ICD-10-CM

## 2017-12-20 DIAGNOSIS — Z923 Personal history of irradiation: Secondary | ICD-10-CM | POA: Diagnosis not present

## 2017-12-20 DIAGNOSIS — R221 Localized swelling, mass and lump, neck: Secondary | ICD-10-CM | POA: Diagnosis not present

## 2017-12-20 DIAGNOSIS — I10 Essential (primary) hypertension: Secondary | ICD-10-CM | POA: Diagnosis not present

## 2017-12-20 DIAGNOSIS — Z170 Estrogen receptor positive status [ER+]: Secondary | ICD-10-CM

## 2017-12-20 DIAGNOSIS — Z79899 Other long term (current) drug therapy: Secondary | ICD-10-CM | POA: Diagnosis not present

## 2017-12-20 DIAGNOSIS — Z7901 Long term (current) use of anticoagulants: Secondary | ICD-10-CM | POA: Diagnosis not present

## 2017-12-20 NOTE — Progress Notes (Signed)
CLINIC:  Survivorship   REASON FOR VISIT:  Routine follow-up post-treatment for a recent history of breast cancer.  BRIEF ONCOLOGIC HISTORY:    Malignant neoplasm of upper-outer quadrant of right breast in female, estrogen receptor positive ([place])   05/16/2017 Initial Diagnosis    Left breast biopsy UIQ: Complex sclerosing lesion with calcifications and sclerosing adenosis, right breast biopsy 10:00: IDC grade 2, DCIS, ER/PR positive, HER-2 negative ratio 1.26,  11 mm lesion in the right breast at 10:00 position: T1b N0 stage IA clinical stage       06/22/2017 Surgery    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 cm, 1/3 micrometastatic disease in lymph nodes ; Left lumpectomy: Complex sclerosing lesion; grade 2, ER +, PR 50%, HER-2 negative ratio 1.26, Ki-67 15%, T1 cN1 mic M0 stage IA AJCC 8       08/07/2017 Oncotype testing    Oncotype DX score 27: Intermediate risk, 10 year risk of distant recurrence with tamoxifen alone 12% and chemotherapy plus tamoxifen 8%        08/09/2017 - 09/27/2017 Radiation Therapy    Adjuvant radiation therapy      10/2017 -  Anti-estrogen oral therapy    Letrozole daily       INTERVAL HISTORY:  Angela Buchanan presents to the [facility] today for our initial meeting to review her survivorship care plan detailing her treatment course for breast cancer, as well as monitoring long-term side effects of that treatment, education regarding health maintenance, screening, and overall wellness and health promotion.     Overall, Angela Buchanan reports feeling quite well.  She is having difficulty with taking Letrozole.  Her joints in her hands are aching and she is having difficulty gripping things.  Her feet are aching as well.  Otherwise she is well and is without questions concerns.      REVIEW OF SYSTEMS:  Review of Systems  Constitutional: Negative for appetite change, chills, diaphoresis, fatigue and fever.  HENT:   Negative for hearing loss and lump/mass.    Eyes: Negative for eye problems and icterus.  Respiratory: Negative for chest tightness, cough and shortness of breath.   Cardiovascular: Negative for chest pain, leg swelling and palpitations.  Gastrointestinal: Negative for abdominal distention, abdominal pain, constipation, diarrhea, nausea and vomiting.  Endocrine: Negative for hot flashes.  Skin: Negative for itching and rash.  Neurological: Negative for dizziness, extremity weakness, headaches and numbness.  Hematological: Negative for adenopathy. Does not bruise/bleed easily.  Psychiatric/Behavioral: Negative for depression. The patient is not nervous/anxious.    Breast: Denies any new nodularity, masses, tenderness, nipple changes, or nipple discharge.      ONCOLOGY TREATMENT TEAM:  1. Surgeon:  Dr. Dalbert Batman at [facility] 2. Medical Oncologist: Dr. Lindi Adie  3. Radiation Oncologist: Dr. Lisbeth Renshaw    PAST MEDICAL/SURGICAL HISTORY:  Past Medical History:  Diagnosis Date  . Bigeminy    no current med.  . Breast cancer ([place]) 06/2017   right  . Bruises easily   . Dental crowns present   . History of diverticulitis   . Hypertension    states under control with med., has been on med. x 5 yr.  . 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 Seasonal allergies    Past Surgical History:  Procedure Laterality Date  . ABDOMINAL HYSTERECTOMY     partial  . BREAST LUMPECTOMY WITH RADIOACTIVE SEED AND SENTINEL LYMPH NODE BIOPSY Right 06/22/2017   Procedure: RIGHT BREAST LUMPECTOMY WITH RADIOACTIVE SEED AND  RIGHT AXILLARY DEEP SENTINEL LYMPH NODE BIOPSY WITH BLUE DYE INJECTION;  Surgeon: Fanny Skates, MD;  Location: [place];  Service: General;  Laterality: Right;  . BREAST LUMPECTOMY WITH RADIOACTIVE SEED LOCALIZATION Left 06/22/2017   Procedure: LEFT BREAST LUMPECTOMY WITH RADIOACTIVE SEED LOCALIZATION;  Surgeon: Fanny Skates, MD;  Location: [place];  Service: General;  Laterality:  Left;  . CATARACT EXTRACTION W/ INTRAOCULAR LENS  IMPLANT, BILATERAL Bilateral   . TONSILLECTOMY     age 79     ALLERGIES:  Allergies  Allergen Reactions  . Atenolol Other (See Comments)    CHEST PAIN  . Bisoprolol Other (See Comments)    "FEELS WEIRD"  . Prednisone Other (See Comments)    UNABLE TO FOCUS  . Latex Rash     CURRENT MEDICATIONS:  Outpatient Encounter Medications as of 12/20/2017  Medication Sig  . aspirin 81 MG tablet Take 81 mg by mouth daily.  Marland Kitchen b complex vitamins tablet Take 1 tablet by mouth daily.  . Calcium Citrate 250 MG TABS Take 250 mg by mouth 4 (four) times daily.  . cholecalciferol (VITAMIN D) 1000 units tablet Take 2,000 Units by mouth daily.  . citalopram (CELEXA) 10 MG tablet Take 10 mg by mouth daily as needed (anxiety).   . fluticasone (FLONASE) 50 MCG/ACT nasal spray   . letrozole (FEMARA) 2.5 MG tablet Take 1 tablet (2.5 mg total) by mouth daily.  Marland Kitchen lisinopril (PRINIVIL,ZESTRIL) 20 MG tablet Take 1 tablet (20 mg total) by mouth daily.  . montelukast (SINGULAIR) 10 MG tablet Take 10 mg by mouth at bedtime.   . Multiple Vitamin (MULTIVITAMIN WITH MINERALS) TABS tablet Take 1 tablet by mouth daily.  . [DISCONTINUED] HYDROcodone-acetaminophen (NORCO) 5-325 MG tablet Take 1-2 tablets by mouth every 6 (six) hours as needed for moderate pain or severe pain. (Patient not taking: Reported on 07/31/2017)   No facility-administered encounter medications on file as of 12/20/2017.      ONCOLOGIC FAMILY HISTORY:  Family History  Problem Relation Age of Onset  . Hypertension Sister   . Dementia Father   . Stroke Sister      GENETIC COUNSELING/TESTING:   SOCIAL HISTORY:  Social History   Socioeconomic History  . Marital status: Married    Spouse name: Not on file  . Number of children: 0  . Years of education: college  . Highest education level: Not on file  Occupational History  . Occupation: Retried Product manager: [employer]  . Financial resource strain: Not on file  . Food insecurity:    Worry: Not on file    Inability: Not on file  . Transportation needs:    Medical: Not on file    Non-medical: Not on file  Tobacco Use  . Smoking status: Never Smoker  . Smokeless tobacco: Never Used  Substance and Sexual Activity  . Alcohol use: No    Alcohol/week: 0.0 oz  . Drug use: No  . Sexual activity: Never    Birth control/protection: Surgical    Comment: hyst  Lifestyle  . Physical activity:    Days per week: Not on file    Minutes per session: Not on file  . Stress: Not on file  Relationships  . Social connections:    Talks on phone: Not on file    Gets together: Not on file    Attends religious service: Not on file    Active member of  club or organization: Not on file    Attends meetings of clubs or organizations: Not on file    Relationship status: Not on file  . Intimate partner violence:    Fear of current or ex partner: Not on file    Emotionally abused: Not on file    Physically abused: Not on file    Forced sexual activity: Not on file  Other Topics Concern  . Not on file  Social History Narrative   Patient lives at home with husband  ([name]).    Patient is rtired caoll   Right handed.   Caffeine 8oz . Caffeine daily      PHYSICAL EXAMINATION:  Vital Signs:   Vitals:   12/20/17 1418  BP: (!) 144/77  Pulse: 62  Resp: 18  Temp: 98 F (36.7 C)  SpO2: 100%   Filed Weights   12/20/17 1418  Weight: 118 lb 12.8 oz (53.9 kg)   General: Well-nourished, well-appearing female in no acute distress.  She is unaccompanied today.   HEENT: Head is normocephalic.  Pupils equal and reactive to light. Conjunctivae clear without exudate.  Sclerae anicteric. Oral mucosa is pink, moist.  Oropharynx is pink without lesions or erythema. + right lateral neck mass Lymph: No cervical, supraclavicular, or infraclavicular lymphadenopathy noted on palpation.  Cardiovascular: Regular  rate and rhythm.Marland Kitchen Respiratory: Clear to auscultation bilaterally. Chest expansion symmetric; breathing non-labored.  GI: Abdomen soft and round; non-tender, non-distended. Bowel sounds normoactive.  GU: Deferred.  Neuro: No focal deficits. Steady gait.  Psych: Mood and affect normal and appropriate for situation.  Extremities: No edema. MSK: No focal spinal tenderness to palpation.  Full range of motion in bilateral upper extremities Skin: Warm and dry.  LABORATORY DATA:  None for this visit.  DIAGNOSTIC IMAGING:  None for this visit.      ASSESSMENT AND PLAN:  Angela Buchanan is a pleasant 67 y.o. female with Stage IA right breast invasive ductal carcinoma, ER+/PR+/HER2-, diagnosed in 05/2017, treated with lumpectomy, adjuvant radiation therapy, and anti-estrogen therapy with Letrozole beginning in 10/2017.  She presents to the Survivorship Clinic for our initial meeting and routine follow-up post-completion of treatment for breast cancer.    1. Stage IA right breast cancer:  Angela Buchanan is continuing to recover from definitive treatment for breast cancer. She will follow-up with her medical oncologist, Dr. Lindi Adie in 6 months with history and physical exam per surveillance protocol.  See #2 today.   Today, a comprehensive survivorship care plan and treatment summary was reviewed with the patient today detailing her breast cancer diagnosis, treatment course, potential late/long-term effects of treatment, appropriate follow-up care with recommendations for the future, and patient education resources.  A copy of this summary, along with a letter will be sent to the patient's primary care provider via mail/fax/In Basket message after today's visit.    2.  Joint aches and pains: She will take a vacation from her Letrozole for a couple of weeks and see if it is the etiology of her pain.  If so, she will call us and we can change her anti estrogen to Anastrozole.  I reviewed this with her in detail.   3.  Right sided neck mass: ? Hear bruit, CT on 05/25/2017 was normal, patient states she feels more pain in that area however.  Will get a doppler.    4. Bone health:  Given Angela Buchanan age/history of breast cancer and her current treatment regimen including anti-estrogen therapy with Letrozole, she  is at risk for bone demineralization.  She is due for bone density testing.  This was ordered, and a couple of things have come up, and she is about to reschedule it.   In the meantime, she was encouraged to increase her consumption of foods rich in calcium, as well as increase her weight-bearing activities.  She was given education on specific activities to promote bone health.  5. Cancer screening:  Due to Angela Buchanan's history and her age, she should receive screening for skin cancers, colon cancer, and gynecologic cancers.  The information and recommendations are listed on the patient's comprehensive care plan/treatment summary and were reviewed in detail with the patient.    6. Health maintenance and wellness promotion: Angela Buchanan was encouraged to consume 5-7 servings of fruits and vegetables per day. We reviewed the "Nutrition Rainbow" handout, as well as the handout "Take Control of Your Health and Reduce Your Cancer Risk" from the [place].  She was also encouraged to engage in moderate to vigorous exercise for 30 minutes per day most days of the week. We discussed the [employer] fitness program, which is designed for cancer survivors to help them become more physically fit after cancer treatments.  She was instructed to limit her alcohol consumption and continue to abstain from tobacco use.     7. Support services/counseling: It is not uncommon for this period of the patient's cancer care trajectory to be one of many emotions and stressors.  We discussed an opportunity for her to participate in the next session of [facility] ("Finding Your New Normal") support group series designed for patients  after they have completed treatment.   Angela Buchanan was encouraged to take advantage of our many other support services programs, support groups, and/or counseling in coping with her new life as a cancer survivor after completing anti-cancer treatment.  She was offered support today through active listening and expressive supportive counseling.  She was given information regarding our available services and encouraged to contact me with any questions or for help enrolling in any of our support group/programs.    Dispo:   -Return to cancer center in 6 months for f/u  -Mammogram due in 04/2018  -Follow up with Dr. Dalbert Batman in 12/2017 -She is welcome to return back to the Survivorship Clinic at any time; no additional follow-up needed at this time.  -Consider referral back to survivorship as a long-term survivor for continued surveillance  A total of (30) minutes of face-to-face time was spent with this patient with greater than 50% of that time in counseling and care-coordination.   Gardenia Phlegm, [place] [phone]   Note: PRIMARY CARE PROVIDER [name], [place] [phone]

## 2017-12-21 ENCOUNTER — Ambulatory Visit: Payer: Medicare Other | Admitting: Physical Therapy

## 2017-12-21 ENCOUNTER — Encounter: Payer: Self-pay | Admitting: Physical Therapy

## 2017-12-21 DIAGNOSIS — M25511 Pain in right shoulder: Secondary | ICD-10-CM

## 2017-12-21 DIAGNOSIS — R293 Abnormal posture: Secondary | ICD-10-CM

## 2017-12-21 DIAGNOSIS — M25611 Stiffness of right shoulder, not elsewhere classified: Secondary | ICD-10-CM

## 2017-12-21 DIAGNOSIS — L599 Disorder of the skin and subcutaneous tissue related to radiation, unspecified: Secondary | ICD-10-CM

## 2017-12-21 NOTE — Therapy (Signed)
[place], [place], [address] Phone: [phone]   Fax:  [phone]  Physical Therapy Treatment  Patient Details  Name: Angela Buchanan MRN: [id number] Date of Birth: 04/29/1951 Referring Provider: Shona Simpson    Encounter Date: 12/21/2017  PT End of Session - 12/21/17 1215    Visit Number  12    Number of Visits  17    PT Start Time  0930    PT Stop Time  1015    PT Time Calculation (min)  45 min    Activity Tolerance  Patient tolerated treatment well    Behavior During Therapy  [facility] for tasks assessed/performed       Past Medical History:  Diagnosis Date  . Bigeminy    no current med.  . Breast cancer ([place]) 06/2017   right  . Bruises easily   . Dental crowns present   . History of diverticulitis   . Hypertension    states under control with med., has been on med. x 5 yr.  . 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 Seasonal allergies     Past Surgical History:  Procedure Laterality Date  . ABDOMINAL HYSTERECTOMY     partial  . BREAST LUMPECTOMY WITH RADIOACTIVE SEED AND SENTINEL LYMPH NODE BIOPSY Right 06/22/2017   Procedure: RIGHT BREAST LUMPECTOMY WITH RADIOACTIVE SEED AND RIGHT AXILLARY DEEP SENTINEL LYMPH NODE BIOPSY WITH BLUE DYE INJECTION;  Surgeon: Fanny Skates, MD;  Location: [place];  Service: General;  Laterality: Right;  . BREAST LUMPECTOMY WITH RADIOACTIVE SEED LOCALIZATION Left 06/22/2017   Procedure: LEFT BREAST LUMPECTOMY WITH RADIOACTIVE SEED LOCALIZATION;  Surgeon: Fanny Skates, MD;  Location: [place];  Service: General;  Laterality: Left;  . CATARACT EXTRACTION W/ INTRAOCULAR LENS  IMPLANT, BILATERAL Bilateral   . TONSILLECTOMY     age 67    There were no vitals filed for this visit.  Subjective Assessment - 12/21/17 1214    Subjective  Pt reports she is doing well, but she still have very tender tight areas in anterior  chest and around right shoulder and she noted her right shoulder tends to pull forward even though she frequently works on bringing the shoulder blade back and down     Pertinent History  right breast cancer with lumpectomy with 3 deep nodes removed and left breast sclerotic lesion with lumpectomy on 06/22/2017 followed by radition completed 09/27/2017 ( no chemotherapy)  Husband is now undergoing treatment for head and neck cancer and is having a difficulty time.  Pt has developed pain and limited range of motion of right shoulder     Currently in Pain?  Yes    Pain Score  3     Pain Location  Axilla                No data recorded       [facility] Adult PT Treatment/Exercise - 12/21/17 0001      Self-Care   Self-Care  Other Self-Care Comments    Other Self-Care Comments   showed pt compression bra to allow pressure to foam patch to pec major muscle.  Script sent to [employer]       Manual Therapy   Manual therapy comments  instructed pt in self myofascial release technique to pec major with tennis ball in supine and standing     Soft tissue mobilization  with biotone in supine to lateral chest near axilla  and scapular border  extra time spent on pec major as well     Scapular Mobilization  to posterior tilt and depression  in sidelying emphasis on inferior glide as well     Passive ROM  passive strech to pec minor in supine                   PT Long Term Goals - 12/13/17 1349      PT LONG TERM GOAL #1   Title  Pt will report the pain in her right upper quadrant is decreased so that she can turn the steering wheel without pain while she is driving     Status  Achieved      PT LONG TERM GOAL #2   Title  Pt will be independent in a home exercise program     Baseline  Pt is ready to have HEP upgraded     Status  Partially Met      PT LONG TERM GOAL #3   Title  Pt will have 150 degrees of right shoulder abduction so that she can perform her household activities  easier     Baseline  102 on eval, 155 on 11/23/2017    Status  Achieved      PT LONG TERM GOAL #4   Title  Pt reports that she wants to strengthen her arm so that she doesn't fatigue as easliy and can do more of the things she used to do before she got sick     Time  4    Period  Weeks    Status  New      PT LONG TERM GOAL #5   Title  Pt will tightness in right posterior shoulder decreased by 75%     Time  4    Period  Weeks    Status  New            Plan - 12/21/17 1216    Clinical Impression Statement  Focued on myofascial release to pec major and improvement in scapular mobility.  Pt is still having diffuculty with this despite PT treatment and home exercise.  Discussed compression pads to area held in place by compression bra and pt is willing to try this     PT Treatment/Interventions  ADLs/Self Care Home Management;Compression bandaging;Scar mobilization;Passive range of motion;Patient/family education;Therapeutic exercise;Therapeutic activities;Manual techniques;Taping;Orthotic Fit/Training;DME Instruction    PT Next Visit Plan  Check to see if script for bra is returned Continue with upgrades of scapular strengthening and joint and scapular mobilizaion  with focus on end ROM/decreasing cording and trigger points with manual therapies Ongoing postural work as well.     PT Home Exercise Plan  supine dowel for shoulder flexion and abduction , Meeks decompresssion , standing posture and scapular retraction  supine scapular series , shoulder isometrics  [place], scapular strengthening , self myofascial release     Consulted and Agree with Plan of Care  Patient       Patient will benefit from skilled therapeutic intervention in order to improve the following deficits and impairments:  Decreased scar mobility, Decreased knowledge of precautions, Decreased knowledge of use of DME, Decreased strength, Increased fascial restricitons, Impaired UE functional use, Pain, Decreased range of  motion, Impaired perceived functional ability, Postural dysfunction  Visit Diagnosis: Abnormal posture  Disorder of the skin and subcutaneous tissue related to radiation, unspecified  Stiffness of right shoulder, not elsewhere classified  Acute pain of right shoulder  Problem List Patient Active Problem List   Diagnosis Date Noted  . Malignant neoplasm of upper-outer quadrant of right breast in female, estrogen receptor positive ([place]) 05/22/2017  . Diplopia 09/17/2014  . Dizziness and giddiness 09/17/2014  . Essential hypertension 07/22/2014  . Chest pain 07/22/2014  . Family history of heart disease 07/22/2014   Donato Heinz. Owens Shark PT  [place] Levo 12/21/2017, 12:19 PM  [place], [place], [address] Phone: [phone]   Fax:  [phone]  Name: Angela Buchanan MRN: [id number] Date of Birth: 06/20/1951

## 2017-12-25 ENCOUNTER — Ambulatory Visit: Payer: Medicare Other | Admitting: Physical Therapy

## 2017-12-27 ENCOUNTER — Other Ambulatory Visit: Payer: Self-pay | Admitting: Adult Health

## 2017-12-27 ENCOUNTER — Ambulatory Visit: Payer: Medicare Other | Admitting: Physical Therapy

## 2017-12-27 DIAGNOSIS — M25611 Stiffness of right shoulder, not elsewhere classified: Secondary | ICD-10-CM

## 2017-12-27 DIAGNOSIS — M25511 Pain in right shoulder: Secondary | ICD-10-CM

## 2017-12-27 DIAGNOSIS — R293 Abnormal posture: Secondary | ICD-10-CM

## 2017-12-27 DIAGNOSIS — R221 Localized swelling, mass and lump, neck: Secondary | ICD-10-CM

## 2017-12-27 DIAGNOSIS — L599 Disorder of the skin and subcutaneous tissue related to radiation, unspecified: Secondary | ICD-10-CM

## 2017-12-27 NOTE — Therapy (Addendum)
[place], [place], [address] Phone: [phone]   Fax:  [phone]  Physical Therapy Treatment  Patient Details  Name: Angela Buchanan MRN: [id number] Date of Birth: [birth year] Referring Provider: Shona Simpson    Encounter Date: 12/27/2017  PT End of Session - 12/27/17 1435    Visit Number  13    Number of Visits  17    Date for PT Re-Evaluation  01/14/18    PT Start Time  1344    PT Stop Time  1434    PT Time Calculation (min)  50 min    Activity Tolerance  Patient tolerated treatment well;Patient limited by pain    Behavior During Therapy  [facility] for tasks assessed/performed       Past Medical History:  Diagnosis Date  . Bigeminy    no current med.  . Breast cancer ([place]) 06/2017   right  . Bruises easily   . Dental crowns present   . History of diverticulitis   . Hypertension    states under control with med., has been on med. x 5 yr.  . 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 Seasonal allergies     Past Surgical History:  Procedure Laterality Date  . ABDOMINAL HYSTERECTOMY     partial  . BREAST LUMPECTOMY WITH RADIOACTIVE SEED AND SENTINEL LYMPH NODE BIOPSY Right 06/22/2017   Procedure: RIGHT BREAST LUMPECTOMY WITH RADIOACTIVE SEED AND RIGHT AXILLARY DEEP SENTINEL LYMPH NODE BIOPSY WITH BLUE DYE INJECTION;  Surgeon: Fanny Skates, MD;  Location: [place];  Service: General;  Laterality: Right;  . BREAST LUMPECTOMY WITH RADIOACTIVE SEED LOCALIZATION Left 06/22/2017   Procedure: LEFT BREAST LUMPECTOMY WITH RADIOACTIVE SEED LOCALIZATION;  Surgeon: Fanny Skates, MD;  Location: [facility];  Service: General;  Laterality: Left;  . CATARACT EXTRACTION W/ INTRAOCULAR LENS  IMPLANT, BILATERAL Bilateral   . TONSILLECTOMY     age 67    There were no vitals filed for this visit.  Subjective Assessment - 12/27/17 1347    Subjective  "My shoulder hurts.  I'm trying to drop this down. It really hurt Saturday; I was supposed to come Tuesday but I didn't come, it hurt so bad."    Pertinent History  right breast cancer with lumpectomy with 3 deep nodes removed and left breast sclerotic lesion with lumpectomy on 06/22/2017 followed by radition completed 09/27/2017 ( no chemotherapy)  Husband is now undergoing treatment for head and neck cancer and is having a difficulty time.  Pt has developed pain and limited range of motion of right shoulder     Currently in Pain?  Yes    Pain Score  5     Pain Location  Shoulder    Pain Orientation  Right    Pain Descriptors / Indicators  Other (Comment) painful    Pain Radiating Towards  into the hand    Aggravating Factors   doesn't know    Pain Relieving Factors  Tylenol                No data recorded       [facility] Adult PT Treatment/Exercise - 12/27/17 0001      Shoulder Exercises: Supine   Other Supine Exercises  supine over towel roll, had patient stretch arms out in "T" to get stretch on right, but she had limited tolerance for this. Then tried this in supine withOUT towel roll, but still got uncomfortable with  arms outstretched.      Manual Therapy   Manual Therapy  Other (comment)    Myofascial Release  tried pulling of right arm, but some pain in forearm with this    Scapular Mobilization  In left sidelying, right scapular protraction and depression with rhythmic gentle movements for scapular mobility and muscle relaxation    Passive ROM  In supine, right shoulder into er, abduction, and flexion to patient's tolerance, but limited significantly by pain.    Other Manual Therapy  In sitting, soft tissue work with Biotone to right upper trap, neck, and right upper arm for muscle relaxation.                  PT Long Term Goals - 12/27/17 1350      PT LONG TERM GOAL #1   Title  Pt will report the pain in her right upper quadrant is decreased so that she can turn the steering  wheel without pain while she is driving     Status  Achieved      PT LONG TERM GOAL #2   Title  Pt will be independent in a home exercise program     Baseline  On 12/27/17, says she is doing stretching, Theraband (rows, right shoulder abduction, protraction)    Status  Partially Met      PT LONG TERM GOAL #3   Title  Pt will have 150 degrees of right shoulder abduction so that she can perform her household activities easier     Status  Achieved      PT LONG TERM GOAL #4   Title  Pt reports that she wants to strengthen her arm so that she doesn't fatigue as easliy and can do more of the things she used to do before she got sick     Status  On-going      PT LONG TERM GOAL #5   Title  Pt will tightness in right posterior shoulder decreased by 75%     Baseline  about 20% as of 12/27/17    Status  Partially Met            Plan - 12/27/17 1435    Clinical Impression Statement  Patient's right shoulder er, flexion, abduction, and horizontal abduction all limited by pain today.  Gentle stretching tried to try to release some tightness, but with limited results. Then worked on muscle relaxation and pain relieving with rhythmic scapular motions and soft tissue work. Pt. reported feeling better at end of session. Goals remain only partially met.    Rehab Potential  Good    Clinical Impairments Affecting Rehab Potential  3 deep nodes removed on right side, radiation to right upper quadrant     PT Frequency  2x / week    PT Duration  4 weeks    PT Treatment/Interventions  ADLs/Self Care Home Management;Compression bandaging;Scar mobilization;Passive range of motion;Patient/family education;Therapeutic exercise;Therapeutic activities;Manual techniques;Taping;Orthotic Fit/Training;DME Instruction    PT Next Visit Plan  Continue manual work to improved flexibility at right shoulder as well as decrease pain in the area.    PT Home Exercise Plan  supine dowel for shoulder flexion and abduction ,  [place]ks decompresssion , standing posture and scapular retraction  supine scapular series , shoulder isometrics  [place], scapular strengthening , self myofascial release     Consulted and Agree with Plan of Care  Patient       Patient will benefit from skilled therapeutic intervention in order  to improve the following deficits and impairments:  Decreased scar mobility, Decreased knowledge of precautions, Decreased knowledge of use of DME, Decreased strength, Increased fascial restricitons, Impaired UE functional use, Pain, Decreased range of motion, Impaired perceived functional ability, Postural dysfunction  Visit Diagnosis: Abnormal posture  Disorder of the skin and subcutaneous tissue related to radiation, unspecified  Stiffness of right shoulder, not elsewhere classified  Acute pain of right shoulder     Problem List Patient Active Problem List   Diagnosis Date Noted  . Malignant neoplasm of upper-outer quadrant of right breast in female, estrogen receptor positive ([place]) 05/22/2017  . Diplopia 09/17/2014  . Dizziness and giddiness 09/17/2014  . Essential hypertension 07/22/2014  . Chest pain 07/22/2014  . Family history of heart disease 07/22/2014    SALISBURY,DONNA 12/27/2017, 2:39 PM  [place], [place], [address] Phone: [phone]   Fax:  [phone]  Name: Angela Buchanan MRN: [id number] Date of Birth: 07/05/1951  Serafina Royals, PT 12/27/17 2:39 PM PHYSICAL THERAPY DISCHARGE SUMMARY  Visits from Start of Care: 13  Current functional level related to goals / functional outcomes: unknown   Remaining deficits: unknown   Education / Equipment: Home exercise  Plan: Patient agrees to discharge.  Patient goals were partially met. Patient is being discharged due to not returning since the last visit.  ?????    Donato Heinz. Owens Shark, PT

## 2017-12-28 ENCOUNTER — Encounter: Payer: Self-pay | Admitting: Adult Health

## 2017-12-28 ENCOUNTER — Other Ambulatory Visit: Payer: Self-pay | Admitting: Adult Health

## 2017-12-28 ENCOUNTER — Inpatient Hospital Stay (HOSPITAL_BASED_OUTPATIENT_CLINIC_OR_DEPARTMENT_OTHER): Payer: Medicare Other | Admitting: Adult Health

## 2017-12-28 ENCOUNTER — Ambulatory Visit (HOSPITAL_COMMUNITY)
Admission: RE | Admit: 2017-12-28 | Discharge: 2017-12-28 | Disposition: A | Discharge status: Home / Self Care | Payer: Medicare Other | Source: Ambulatory Visit | Attending: Adult Health | Admitting: Adult Health

## 2017-12-28 ENCOUNTER — Ambulatory Visit (HOSPITAL_BASED_OUTPATIENT_CLINIC_OR_DEPARTMENT_OTHER)
Admission: RE | Admit: 2017-12-28 | Discharge: 2017-12-28 | Disposition: A | Discharge status: Home / Self Care | Payer: Medicare Other | Source: Ambulatory Visit | Attending: Adult Health | Admitting: Adult Health

## 2017-12-28 ENCOUNTER — Telehealth: Payer: Self-pay | Admitting: Adult Health

## 2017-12-28 VITALS — BP 162/74 | HR 57 | Temp 97.9°F | Resp 98 | Ht 62.0 in | Wt 119.4 lb

## 2017-12-28 DIAGNOSIS — Z7982 Long term (current) use of aspirin: Secondary | ICD-10-CM

## 2017-12-28 DIAGNOSIS — Z79811 Long term (current) use of aromatase inhibitors: Secondary | ICD-10-CM

## 2017-12-28 DIAGNOSIS — Z7901 Long term (current) use of anticoagulants: Secondary | ICD-10-CM

## 2017-12-28 DIAGNOSIS — I82621 Acute embolism and thrombosis of deep veins of right upper extremity: Secondary | ICD-10-CM | POA: Diagnosis not present

## 2017-12-28 DIAGNOSIS — Z923 Personal history of irradiation: Secondary | ICD-10-CM

## 2017-12-28 DIAGNOSIS — R221 Localized swelling, mass and lump, neck: Secondary | ICD-10-CM | POA: Diagnosis not present

## 2017-12-28 DIAGNOSIS — Z170 Estrogen receptor positive status [ER+]: Secondary | ICD-10-CM

## 2017-12-28 DIAGNOSIS — C50411 Malignant neoplasm of upper-outer quadrant of right female breast: Secondary | ICD-10-CM

## 2017-12-28 DIAGNOSIS — M2550 Pain in unspecified joint: Secondary | ICD-10-CM | POA: Diagnosis not present

## 2017-12-28 DIAGNOSIS — I10 Essential (primary) hypertension: Secondary | ICD-10-CM

## 2017-12-28 DIAGNOSIS — I82B11 Acute embolism and thrombosis of right subclavian vein: Secondary | ICD-10-CM | POA: Diagnosis not present

## 2017-12-28 DIAGNOSIS — Z9221 Personal history of antineoplastic chemotherapy: Secondary | ICD-10-CM

## 2017-12-28 DIAGNOSIS — Z79899 Other long term (current) drug therapy: Secondary | ICD-10-CM

## 2017-12-28 MED ORDER — RIVAROXABAN (XARELTO) VTE STARTER PACK (15 & 20 MG): ORAL_TABLET | ORAL | 0 refills | Status: DC

## 2017-12-28 MED ORDER — ANASTROZOLE 1 MG PO TABS: 1.0000 mg | ORAL_TABLET | Freq: Every day | ORAL | 2 refills | Status: DC

## 2017-12-28 NOTE — Assessment & Plan Note (Signed)
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  Although she isn't happy that she developed a right subclavian DVT, she is happy to know what has been causing this pain for so many months.  I reviewed this with Dr. Lindi Adie.  She will start on Xarelto 40m po BID x 21 days, then go to 273[facility]aily with food.  I gave her detailed info on Xarelto in her AVS.  She verbalizes understanding.    LaOluwatomisinlso notes that her pain is better without Letrozole.  I told her that we would change her anti estrogen therapy to Anastrozole.  I sent in a prescription for Anastrozole to her pharmacy, however I told her to wait about 2-4 weeks before she starts taking it.  She agrees.    Per Dr. GuLindi Adieshe does not need any expedited or sooner follow up than 6 month f/u which is already scheduled.

## 2017-12-28 NOTE — Telephone Encounter (Signed)
Per 3/29 no los 

## 2017-12-28 NOTE — Progress Notes (Addendum)
Bilateral carotid duplex completed. No evidence of significant ICA stenosis. Vertebral artery flow is antegrade. Incidental finding - subclavian vein thrombus. [facility], RVS  12/28/2017,2:17 PM

## 2017-12-28 NOTE — Progress Notes (Signed)
[facility] Follow up:    Angela Small, MD [address] [place] [address]   DIAGNOSIS: Cancer Staging Malignant neoplasm of upper-outer quadrant of right breast in female, estrogen receptor positive ([place]) Staging form: Breast, AJCC 8th Edition - Clinical stage from 05/23/2017: Stage IA (cT1c, cN0, cM0, G2, ER: Positive, PR: Positive, HER2: Negative) - Unsigned - Pathologic: Stage IA (pT1c, pN41m, cM0, G2, ER: Positive, PR: Positive, HER2: Negative) - Signed by CGardenia Phlegm NP on 07/04/2017   SUMMARY OF ONCOLOGIC HISTORY:   Malignant neoplasm of upper-outer quadrant of right breast in female, estrogen receptor positive ([place]   05/16/2017 Initial Diagnosis    Left breast biopsy UIQ: Complex sclerosing lesion with calcifications and sclerosing adenosis, right breast biopsy 10:00: IDC grade 2, DCIS, ER/PR positive, HER-2 negative ratio 1.26,  11 mm lesion in the right breast at 10:00 position: T1b N0 stage IA clinical stage       06/22/2017 Surgery    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 cm, 1/3 micrometastatic disease in lymph nodes ; Left lumpectomy: Complex sclerosing lesion; grade 2, ER +, PR 50%, HER-2 negative ratio 1.26, Ki-67 15%, T1 cN1 mic M0 stage IA AJCC 8       08/07/2017 Oncotype testing    Oncotype DX score 27: Intermediate risk, 10 year risk of distant recurrence with tamoxifen alone 12% and chemotherapy plus tamoxifen 8%        08/09/2017 - 09/27/2017 Radiation Therapy    Adjuvant radiation therapy      10/2017 -  Anti-estrogen oral therapy    Letrozole daily       CURRENT THERAPY: Letrozole  INTERVAL HISTORY: Angela STEPP67y.o. female returns for evaluation and f/u after having doppler on neck and right arm  For right neck pain and fullness, and being found to have right subclavian vein thrombus.  LNancyleenotes that at her last appointment on 12/20/17 she was having difficulties with Letrozole.  I instructed  her to stop taking Letrozole.  Her hand grip issues, and feet aches are much improved.  She is happy that it is better.     Patient Active Problem List   Diagnosis Date Noted  . Malignant neoplasm of upper-outer quadrant of right breast in female, estrogen receptor positive ([place] 05/22/2017  . Diplopia 09/17/2014  . Dizziness and giddiness 09/17/2014  . Essential hypertension 07/22/2014  . Chest pain 07/22/2014  . Family history of heart disease 07/22/2014    is allergic to atenolol; bisoprolol; prednisone; and latex.  MEDICAL HISTORY: Past Medical History:  Diagnosis Date  . Bigeminy    no current med.  . Breast cancer ([place] 06[place]   right  . Bruises easily   . Dental crowns present   . History of diverticulitis   . Hypertension    states under control with med., has been on med. x 5 yr.  . 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 Seasonal allergies     SURGICAL HISTORY: Past Surgical History:  Procedure Laterality Date  . ABDOMINAL HYSTERECTOMY     partial  . BREAST LUMPECTOMY WITH RADIOACTIVE SEED AND SENTINEL LYMPH NODE BIOPSY Right 06/22/2017   Procedure: RIGHT BREAST LUMPECTOMY WITH RADIOACTIVE SEED AND RIGHT AXILLARY DEEP SENTINEL LYMPH NODE BIOPSY WITH BLUE DYE INJECTION;  Surgeon: IFanny Skates MD;  [place] [facility]  Service: General;  Laterality: Right;  . BREAST LUMPECTOMY WITH RADIOACTIVE SEED LOCALIZATION Left 06/22/2017   Procedure:  LEFT BREAST LUMPECTOMY WITH RADIOACTIVE SEED LOCALIZATION;  Surgeon: Fanny Skates, MD;  Location: [facility];  Service: General;  Laterality: Left;  . CATARACT EXTRACTION W/ INTRAOCULAR LENS  IMPLANT, BILATERAL Bilateral   . TONSILLECTOMY     age 49    SOCIAL HISTORY: Social History   Socioeconomic History  . Marital status: Married    Spouse name: Not on file  . Number of children: 0  . Years of education: college  . Highest education level: Not on file  Occupational History   . Occupation: Retried Product manager: [employer]  . Financial resource strain: Not on file  . Food insecurity:    Worry: Not on file    Inability: Not on file  . Transportation needs:    Medical: Not on file    Non-medical: Not on file  Tobacco Use  . Smoking status: Never Smoker  . Smokeless tobacco: Never Used  Substance and Sexual Activity  . Alcohol use: No    Alcohol/week: 0.0 oz  . Drug use: No  . Sexual activity: Never    Birth control/protection: Surgical    Comment: hyst  Lifestyle  . Physical activity:    Days per week: Not on file    Minutes per session: Not on file  . Stress: Not on file  Relationships  . Social connections:    Talks on phone: Not on file    Gets together: Not on file    Attends religious service: Not on file    Active member of club or organization: Not on file    Attends meetings of clubs or organizations: Not on file    Relationship status: Not on file  . Intimate partner violence:    Fear of current or ex partner: Not on file    Emotionally abused: Not on file    Physically abused: Not on file    Forced sexual activity: Not on file  Other Topics Concern  . Not on file  Social History Narrative   Patient lives at home with husband  (Angela Buchanan).    Patient is rtired caoll   Right handed.   Caffeine 8oz . Caffeine daily    FAMILY HISTORY: Family History  Problem Relation Age of Onset  . Hypertension Sister   . Dementia Father   . Stroke Sister     Review of Systems  Constitutional: Negative for appetite change, chills, fatigue, fever and unexpected weight change.  HENT:   Negative for hearing loss, lump/mass and trouble swallowing.   Eyes: Negative for eye problems and icterus.  Respiratory: Negative for chest tightness, cough and shortness of breath.   Cardiovascular: Negative for chest pain, leg swelling and palpitations.  Gastrointestinal: Negative for abdominal distention and abdominal pain.   Endocrine: Negative for hot flashes.  Skin: Negative for itching and rash.  Neurological: Negative for dizziness, extremity weakness, headaches and numbness.  Hematological: Negative for adenopathy. Does not bruise/bleed easily.  Psychiatric/Behavioral: Negative for depression. The patient is not nervous/anxious.       PHYSICAL EXAMINATION  ECOG PERFORMANCE STATUS: 1 - Symptomatic but completely ambulatory  Vitals:   12/28/17 1515  BP: (!) 162/74  Pulse: (!) 57  Resp: (!) 98  Temp: 97.9 F (36.6 C)  SpO2: 98%    Physical Exam  Constitutional: She is oriented to person, place, and time and well-developed, well-nourished, and in no distress.  HENT:  Head: Normocephalic and atraumatic.  Mouth/Throat: Oropharynx is  clear and moist. No oropharyngeal exudate.  Eyes: Pupils are equal, round, and reactive to light. No scleral icterus.  Neck: Neck supple.  Continued right lower supraclavicular swelling  Cardiovascular: Normal rate, regular rhythm and normal heart sounds.  Pulmonary/Chest: Effort normal and breath sounds normal. No respiratory distress. She has no rales.  Abdominal: Soft. Bowel sounds are normal. She exhibits no distension and no mass. There is no tenderness. There is no guarding.  Musculoskeletal: She exhibits no edema.  Lymphadenopathy:    She has no cervical adenopathy.  Neurological: She is alert and oriented to person, place, and time.  Skin: Skin is warm and dry. No rash noted.  Psychiatric: Mood and affect normal.    LABORATORY DATA:  CBC    Component Value Date/Time   WBC 5.1 05/23/2017 1209   WBC 4.9 10/20/2011 2104   RBC 3.75 05/23/2017 1209   RBC 4.09 10/20/2011 2104   HGB 12.5 05/23/2017 1209   HCT 36.6 05/23/2017 1209   PLT 433 (H) 05/23/2017 1209   MCV 97.5 05/23/2017 1209   MCH 33.4 05/23/2017 1209   MCH 33.3 10/20/2011 2104   MCHC 34.3 05/23/2017 1209   MCHC 34.3 10/20/2011 2104   RDW 12.9 05/23/2017 1209   LYMPHSABS 1.7 05/23/2017  1209   MONOABS 0.5 05/23/2017 1209   EOSABS 0.2 05/23/2017 1209   BASOSABS 0.1 05/23/2017 1209    CMP     Component Value Date/Time   NA 138 05/23/2017 1209   K 3.8 05/23/2017 1209   CL 103 10/20/2011 2104   CO2 25 05/23/2017 1209   GLUCOSE 101 05/23/2017 1209   BUN 11.9 05/23/2017 1209   CREATININE 0.8 05/23/2017 1209   CALCIUM 9.5 05/23/2017 1209   PROT 7.2 05/23/2017 1209   ALBUMIN 3.8 05/23/2017 1209   AST 20 05/23/2017 1209   ALT 12 05/23/2017 1209   ALKPHOS 99 05/23/2017 1209   BILITOT 0.50 05/23/2017 1209   GFRNONAA >90 10/20/2011 2104   GFRAA >90 10/20/2011 2104      ASSESSMENT and PLAN:   Malignant neoplasm of upper-outer quadrant of right breast in female, estrogen receptor positive ([place]) 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 cm, 1/3 micrometastatic disease in lymph nodes ; Left lumpectomy: Complex sclerosing lesion; grade 2, ER +, PR 50%, HER-2 negative ratio 1.26, Ki-67 15%, T1 cN1 mic M0 stage IA AJCC 8  Oncotype DX score 27: Intermediate risk, ROR 12% with tamoxifen alone and 8% with tamoxifen with chemo  Adjuvant radiation therapy completed 09/27/2017 Treatment plan: Adjuvant letrozole 2.5 mg daily (stopped 12/20/17 due to arthralgias) 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  Although she isn't happy that she developed a right subclavian DVT, she is happy to know what has been causing this pain for so many months.  I reviewed this with Dr. Lindi Adie.  She will start on Xarelto 79m po BID x 21 days, then go to 256mdaily with food.  I gave her detailed info on Xarelto in her AVS.  She verbalizes understanding.    LaOmuniquelso notes that her pain is better without Letrozole.  I told her that we would change her anti estrogen therapy to Anastrozole.  I sent in a prescription for Anastrozole to her pharmacy, however I told her to wait about 2-4  weeks before she starts taking it.  She agrees.    Per Dr. GuLindi Adieshe does not need any expedited or sooner follow up than  6 month f/u which is already scheduled.      All questions were answered. The patient knows to call the clinic with any problems, questions or concerns. We can certainly see the patient much sooner if necessary.  A total of (30) minutes of face-to-face time was spent with this patient with greater than 50% of that time in counseling and care-coordination.  This note was electronically signed. Scot Dock, NP 12/28/2017

## 2017-12-28 NOTE — Patient Instructions (Signed)
Rivaroxaban oral tablets °What is this medicine? °RIVAROXABAN (ri va ROX a ban) is an anticoagulant (blood thinner). It is used to treat blood clots in the lungs or in the veins. It is also used after knee or hip surgeries to prevent blood clots. It is also used to lower the chance of stroke in people with a medical condition called atrial fibrillation. °This medicine may be used for other purposes; ask your health care provider or pharmacist if you have questions. °COMMON BRAND NAME(S): Xarelto, Xarelto Starter Pack °What should I tell my health care provider before I take this medicine? °They need to know if you have any of these conditions: °-bleeding disorders °-bleeding in the brain °-blood in your stools (black or tarry stools) or if you have blood in your vomit °-history of stomach bleeding °-kidney disease °-liver disease °-low blood counts, like low white cell, platelet, or red cell counts °-recent or planned spinal or epidural procedure °-take medicines that treat or prevent blood clots °-an unusual or allergic reaction to rivaroxaban, other medicines, foods, dyes, or preservatives °-pregnant or trying to get pregnant °-breast-feeding °How should I use this medicine? °Take this medicine by mouth with a glass of water. Follow the directions on the prescription label. Take your medicine at regular intervals. Do not take it more often than directed. Do not stop taking except on your doctor's advice. Stopping this medicine may increase your risk of a blood clot. Be sure to refill your prescription before you run out of medicine. °If you are taking this medicine after hip or knee replacement surgery, take it with or without food. If you are taking this medicine for atrial fibrillation, take it with your evening meal. If you are taking this medicine to treat blood clots, take it with food at the same time each day. If you are unable to swallow your tablet, you may crush the tablet and mix it in applesauce. Then,  immediately eat the applesauce. You should eat more food right after you eat the applesauce containing the crushed tablet. °Talk to your pediatrician regarding the use of this medicine in children. Special care may be needed. °Overdosage: If you think you have taken too much of this medicine contact a poison control center or emergency room at once. °NOTE: This medicine is only for you. Do not share this medicine with others. °What if I miss a dose? °If you take your medicine once a day and miss a dose, take the missed dose as soon as you remember. If you take your medicine twice a day and miss a dose, take the missed dose immediately. In this instance, 2 tablets may be taken at the same time. The next day you should take 1 tablet twice a day as directed. °What may interact with this medicine? °Do not take this medicine with any of the following medications: °-defibrotide °This medicine may also interact with the following medications: °-aspirin and aspirin-like medicines °-certain antibiotics like erythromycin, azithromycin, and clarithromycin °-certain medicines for fungal infections like ketoconazole and itraconazole °-certain medicines for irregular heart beat like amiodarone, quinidine, dronedarone °-certain medicines for seizures like carbamazepine, phenytoin °-certain medicines that treat or prevent blood clots like warfarin, enoxaparin, and dalteparin °-conivaptan °-diltiazem °-felodipine °-indinavir °-lopinavir; ritonavir °-NSAIDS, medicines for pain and inflammation, like ibuprofen or naproxen °-ranolazine °-rifampin °-ritonavir °-SNRIs, medicines for depression, like desvenlafaxine, duloxetine, levomilnacipran, venlafaxine °-SSRIs, medicines for depression, like citalopram, escitalopram, fluoxetine, fluvoxamine, paroxetine, sertraline °-St. John's wort °-verapamil °This list may not describe all   possible interactions. Give your health care provider a list of all the medicines, herbs, non-prescription  drugs, or dietary supplements you use. Also tell them if you smoke, drink alcohol, or use illegal drugs. Some items may interact with your medicine. °What should I watch for while using this medicine? °Visit your doctor or health care professional for regular checks on your progress. °Notify your doctor or health care professional and seek emergency treatment if you develop breathing problems; changes in vision; chest pain; severe, sudden headache; pain, swelling, warmth in the leg; trouble speaking; sudden numbness or weakness of the face, arm or leg. These can be signs that your condition has gotten worse. °If you are going to have surgery or other procedure, tell your doctor that you are taking this medicine. °What side effects may I notice from receiving this medicine? °Side effects that you should report to your doctor or health care professional as soon as possible: °-allergic reactions like skin rash, itching or hives, swelling of the face, lips, or tongue °-back pain °-redness, blistering, peeling or loosening of the skin, including inside the mouth °-signs and symptoms of bleeding such as bloody or black, tarry stools; red or dark-brown urine; spitting up blood or brown material that looks like coffee grounds; red spots on the skin; unusual bruising or bleeding from the eye, gums, or nose °Side effects that usually do not require medical attention (report to your doctor or health care professional if they continue or are bothersome): °-dizziness °-muscle pain °This list may not describe all possible side effects. Call your doctor for medical advice about side effects. You may report side effects to FDA at 1-800-FDA-1088. °Where should I keep my medicine? °Keep out of the reach of children. °Store at room temperature between 15 and 30 degrees C (59 and 86 degrees F). Throw away any unused medicine after the expiration date. °NOTE: This sheet is a summary. It may not cover all possible information. If you  have questions about this medicine, talk to your doctor, pharmacist, or health care provider. °© 2018 Elsevier/Gold Standard (2016-06-07 16:29:33) ° °

## 2017-12-28 NOTE — Progress Notes (Signed)
*  Preliminary Results* Right upper extremity venous duplex completed. Right upper extremity is positive for acute deep vein thrombosis involving the right subclavian vein.  Preliminary results discussed with Mendel Ryder.  12/28/2017 2:34 PM  Maudry Mayhew, BS, RVT, RDCS, RDMS

## 2018-01-01 ENCOUNTER — Encounter: Payer: Medicare Other | Admitting: Physical Therapy

## 2018-01-04 ENCOUNTER — Encounter: Payer: Medicare Other | Admitting: Physical Therapy

## 2018-01-08 ENCOUNTER — Encounter: Payer: Medicare Other | Admitting: Physical Therapy

## 2018-01-10 ENCOUNTER — Encounter: Payer: Medicare Other | Admitting: Adult Health

## 2018-01-10 ENCOUNTER — Encounter: Payer: Medicare Other | Admitting: Physical Therapy

## 2018-01-15 ENCOUNTER — Encounter: Payer: Medicare Other | Admitting: Physical Therapy

## 2018-01-17 ENCOUNTER — Encounter: Payer: Medicare Other | Admitting: Physical Therapy

## 2018-01-22 ENCOUNTER — Ambulatory Visit: Payer: Medicare Other | Admitting: Physical Therapy

## 2018-01-24 ENCOUNTER — Encounter: Payer: Medicare Other | Admitting: Physical Therapy

## 2018-01-24 ENCOUNTER — Other Ambulatory Visit: Payer: Self-pay | Admitting: Adult Health

## 2018-01-24 MED ORDER — RIVAROXABAN 20 MG PO TABS: 20.0000 mg | ORAL_TABLET | Freq: Every day | ORAL | 5 refills | Status: DC

## 2018-01-25 ENCOUNTER — Encounter: Payer: Self-pay | Admitting: Hematology and Oncology

## 2018-01-25 NOTE — Progress Notes (Signed)
Received staff message regarding patient wanting information for copay assistance for Xarelto.  Called patient and left voicemail with contact number for Wynetta Emery and Wynetta Emery whom may be able to assist if she meets the specific FPL income requirements. Left my contact name and number as well.  [facility] Patient Assistance Program This program provides brand name medications at no or low cost    *Some Medicare Part D patients who cannot afford their medicines, and who meet certain financial criteria, may also be eligible for assistance. Please Contact the program for more information ([phone]).   **Please call [phone] or visit Program website for specific FPL income requirements.

## 2018-01-29 ENCOUNTER — Encounter: Payer: Medicare Other | Admitting: Physical Therapy

## 2018-01-31 ENCOUNTER — Encounter: Payer: Medicare Other | Admitting: Physical Therapy

## 2018-02-11 ENCOUNTER — Ambulatory Visit: Payer: Medicare Other | Admitting: Hematology and Oncology

## 2018-03-19 ENCOUNTER — Inpatient Hospital Stay: Admission: RE | Admit: 2018-03-19 | Payer: Medicare Other | Source: Ambulatory Visit

## 2018-04-25 ENCOUNTER — Ambulatory Visit
Admission: RE | Admit: 2018-04-25 | Discharge: 2018-04-25 | Disposition: A | Discharge status: Home / Self Care | Payer: Medicare Other | Source: Ambulatory Visit | Attending: Adult Health | Admitting: Adult Health

## 2018-04-25 DIAGNOSIS — Z170 Estrogen receptor positive status [ER+]: Secondary | ICD-10-CM

## 2018-04-25 DIAGNOSIS — C50411 Malignant neoplasm of upper-outer quadrant of right female breast: Secondary | ICD-10-CM

## 2018-04-25 HISTORY — DX: Personal history of irradiation: Z92.3

## 2018-04-30 ENCOUNTER — Other Ambulatory Visit: Payer: Self-pay | Admitting: General Surgery

## 2018-04-30 DIAGNOSIS — C50411 Malignant neoplasm of upper-outer quadrant of right female breast: Secondary | ICD-10-CM

## 2018-05-02 ENCOUNTER — Telehealth: Payer: Self-pay | Admitting: Hematology and Oncology

## 2018-05-02 ENCOUNTER — Inpatient Hospital Stay: Payer: Medicare Other | Attending: Adult Health | Admitting: Adult Health

## 2018-05-02 ENCOUNTER — Encounter: Payer: Self-pay | Admitting: Adult Health

## 2018-05-02 ENCOUNTER — Inpatient Hospital Stay: Payer: Medicare Other

## 2018-05-02 VITALS — BP 129/70 | HR 60 | Temp 98.2°F | Resp 18 | Ht 62.0 in | Wt 117.9 lb

## 2018-05-02 DIAGNOSIS — I82B11 Acute embolism and thrombosis of right subclavian vein: Secondary | ICD-10-CM

## 2018-05-02 DIAGNOSIS — Z923 Personal history of irradiation: Secondary | ICD-10-CM | POA: Diagnosis not present

## 2018-05-02 DIAGNOSIS — C50411 Malignant neoplasm of upper-outer quadrant of right female breast: Secondary | ICD-10-CM | POA: Insufficient documentation

## 2018-05-02 DIAGNOSIS — M898X9 Other specified disorders of bone, unspecified site: Secondary | ICD-10-CM

## 2018-05-02 DIAGNOSIS — Z170 Estrogen receptor positive status [ER+]: Secondary | ICD-10-CM

## 2018-05-02 DIAGNOSIS — Z79811 Long term (current) use of aromatase inhibitors: Secondary | ICD-10-CM | POA: Diagnosis not present

## 2018-05-02 DIAGNOSIS — Z7901 Long term (current) use of anticoagulants: Secondary | ICD-10-CM

## 2018-05-02 DIAGNOSIS — I82621 Acute embolism and thrombosis of deep veins of right upper extremity: Secondary | ICD-10-CM

## 2018-05-02 LAB — CBC WITH DIFFERENTIAL (CANCER CENTER ONLY)
Basophils Absolute: 0 10*3/uL (ref 0.0–0.1)
Basophils Relative: 1 %
Eosinophils Absolute: 0.1 10*3/uL (ref 0.0–0.5)
Eosinophils Relative: 2 %
HEMATOCRIT: 36.5 % (ref 34.8–46.6)
HEMOGLOBIN: 12.6 g/dL (ref 11.6–15.9)
LYMPHS PCT: 13 %
Lymphs Abs: 0.8 10*3/uL — ABNORMAL LOW (ref 0.9–3.3)
MCH: 33.8 pg (ref 25.1–34.0)
MCHC: 34.5 g/dL (ref 31.5–36.0)
MCV: 97.9 fL (ref 79.5–101.0)
Monocytes Absolute: 0.6 10*3/uL (ref 0.1–0.9)
Monocytes Relative: 10 %
NEUTROS ABS: 4.2 10*3/uL (ref 1.5–6.5)
NEUTROS PCT: 74 %
PLATELETS: 292 10*3/uL (ref 145–400)
RBC: 3.73 MIL/uL (ref 3.70–5.45)
RDW: 12.5 % (ref 11.2–14.5)
WBC: 5.7 10*3/uL (ref 3.9–10.3)

## 2018-05-02 LAB — CMP (CANCER CENTER ONLY)
ALT: 12 U/L (ref 0–44)
ANION GAP: 6 (ref 5–15)
AST: 24 U/L (ref 15–41)
Albumin: 4.2 g/dL (ref 3.5–5.0)
Alkaline Phosphatase: 99 U/L (ref 38–126)
BILIRUBIN TOTAL: 0.4 mg/dL (ref 0.3–1.2)
BUN: 12 mg/dL (ref 8–23)
CHLORIDE: 104 mmol/L (ref 98–111)
CO2: 27 mmol/L (ref 22–32)
Calcium: 9.6 mg/dL (ref 8.9–10.3)
Creatinine: 0.81 mg/dL (ref 0.44–1.00)
GFR, Est AFR Am: >60 mL/min (ref >60)
Glucose, Bld: 93 mg/dL (ref 70–99)
Potassium: 4.2 mmol/L (ref 3.5–5.1)
Sodium: 137 mmol/L (ref 135–145)
Total Protein: 7.4 g/dL (ref 6.5–8.1)

## 2018-05-02 MED ORDER — TRAMADOL HCL 50 MG PO TABS: 25.0000 mg | ORAL_TABLET | Freq: Four times a day (QID) | ORAL | 0 refills | Status: DC | PRN

## 2018-05-02 NOTE — Assessment & Plan Note (Addendum)
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 cm, 1/3 micrometastatic disease in lymph nodes ; Left lumpectomy: Complex sclerosing lesion; grade 2, ER +, PR 50%, HER-2 negative ratio 1.26, Ki-67 15%, T1 cN1 mic M0 stage IA AJCC 8  Oncotype DX score 27: Intermediate risk, ROR 12% with tamoxifen alone and 8% with tamoxifen with chemo  Adjuvant radiation therapy completed 09/27/2017 Treatment plan: Adjuvant letrozole 2.5 mg daily (stopped 12/20/17 due to arthralgias) changed to Anastrozole Carotid and RUE doppler notes right subclavian DVT started Xarelto in 11/2017  ______________________________________________________________________________________  Angela Buchanan continues on Anastrozole daily, and Xarelto.  She is tolerating both of these medications.  However, she is experiencing bone pain.  This bone pain is different and substantially worse than the arthralgias she noted while taking the Letrozole.  She says that she is taking tylenol with minimal relief.  She would also like to know if the Xarelto is helping her blood clot.  She notes that she has family members who have had strokes and she doesn't want to stop the xarelto to soon.  I reviewed this with Dr. Gudena and we will do the following.    1. Continue Anastrozole and Xarelto 2. Bone scan to further evaluate bone pain 3. Repeat RUE doppler to eval clot 4. F/u with Dr. Gudena in 2-4 weeks once the above scans have been completed to review scans and discuss medications.   5. Tramadol 25-50mg po Q6H PRN pain.  Risks/benefits reviewed with patient.   The above plan was reviewed in detail with Angela Buchanan and she is in agreement.    

## 2018-05-02 NOTE — Progress Notes (Signed)
Angela Buchanan Follow up:    Angela Small, MD [place] [place] [address] [id number]   DIAGNOSIS: Buchanan Staging Malignant neoplasm of upper-outer quadrant of right breast in female, estrogen receptor positive ([place]) Staging form: Breast, AJCC 8th Edition - Clinical stage from 05/23/2017: Stage IA (cT1c, cN0, cM0, G2, ER: Positive, PR: Positive, HER2: Negative) - Unsigned - Pathologic: Stage IA (pT1c, pN34m, cM0, G2, ER: Positive, PR: Positive, HER2: Negative) - Signed by CGardenia Phlegm NP on 07/04/2017   SUMMARY OF ONCOLOGIC HISTORY:   Malignant neoplasm of upper-outer quadrant of right breast in female, estrogen receptor positive ([place]   05/16/2017 Initial Diagnosis    Left breast biopsy UIQ: Complex sclerosing lesion with calcifications and sclerosing adenosis, right breast biopsy 10:00: IDC grade 2, DCIS, ER/PR positive, HER-2 negative ratio 1.26,  11 mm lesion in the right breast at 10:00 position: T1b N0 stage IA clinical stage       06/22/2017 Surgery    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 cm, 1/3 micrometastatic disease in lymph nodes ; Left lumpectomy: Complex sclerosing lesion; grade 2, ER +, PR 50%, HER-2 negative ratio 1.26, Ki-67 15%, T1 cN1 mic M0 stage IA AJCC 8       08/07/2017 Oncotype testing    Oncotype DX score 27: Intermediate risk, 10 year risk of distant recurrence with tamoxifen alone 12% and chemotherapy plus tamoxifen 8%        08/09/2017 - 09/27/2017 Radiation Therapy    Adjuvant radiation therapy      10/2017 -  Anti-estrogen oral therapy    Letrozole daily, couldn't tolerate due to arthralgias, changed to Anastrozole in 11/2017       CURRENT THERAPY: Anastrozole  INTERVAL HISTORY: Angela [id number].o. female returns for evaluation and f/u of her estrogen positive breast Buchanan.  She is taking Anastrozole daily and is tolerating it well.  She is also taking Xarelto daily and is tolerating it well.  About 3  weeks ago LCoumbadeveloped body aches over her entire body.  These are different from the joint aches from the Letrozole that she experienced.     Patient Active Problem List   Diagnosis Date Noted  . Malignant neoplasm of upper-outer quadrant of right breast in female, estrogen receptor positive ([place] 05/22/2017  . Diplopia 09/17/2014  . Dizziness and giddiness 09/17/2014  . Essential hypertension 07/22/2014  . Chest pain 07/22/2014  . Family history of heart disease 07/22/2014    is allergic to atenolol; bisoprolol; prednisone; and latex.  MEDICAL HISTORY: Past Medical History:  Diagnosis Date  . Bigeminy    no current med.  . Breast [place] 06[place]   right  . Bruises easily   . Dental crowns present   . History of diverticulitis   . Hypertension    states under control with med., has been on med. x 5 yr.  . Personal history of radiation therapy    2018  . 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 Seasonal allergies     SURGICAL HISTORY: Past Surgical History:  Procedure Laterality Date  . ABDOMINAL HYSTERECTOMY     partial  . BREAST EXCISIONAL BIOPSY Right 06/22/2017   Malignant  . BREAST LUMPECTOMY Right 06/22/2017   Malignant  . BREAST LUMPECTOMY WITH RADIOACTIVE SEED AND SENTINEL LYMPH NODE BIOPSY Right 06/22/2017   Procedure: RIGHT BREAST LUMPECTOMY WITH RADIOACTIVE SEED AND RIGHT AXILLARY DEEP SENTINEL LYMPH NODE BIOPSY WITH BLUE DYE INJECTION;  Surgeon: Fanny Skates, MD;  Location: [facility];  Service: General;  Laterality: Right;  . BREAST LUMPECTOMY WITH RADIOACTIVE SEED LOCALIZATION Left 06/22/2017   Procedure: LEFT BREAST LUMPECTOMY WITH RADIOACTIVE SEED LOCALIZATION;  Surgeon: Fanny Skates, MD;  Location: [facility];  Service: General;  Laterality: Left;  . CATARACT EXTRACTION W/ INTRAOCULAR LENS  IMPLANT, BILATERAL Bilateral   . EXCISION OF BREAST BIOPSY Left 06/22/2017   benign  . TONSILLECTOMY     age 67     SOCIAL HISTORY: Social History   Socioeconomic History  . Marital status: Married    Spouse name: Not on file  . Number of children: 0  . Years of education: college  . Highest education level: Not on file  Occupational History  . Occupation: Retried Product manager: [employer]  . Financial resource strain: Not on file  . Food insecurity:    Worry: Not on file    Inability: Not on file  . Transportation needs:    Medical: Not on file    Non-medical: Not on file  Tobacco Use  . Smoking status: Never Smoker  . Smokeless tobacco: Never Used  Substance and Sexual Activity  . Alcohol use: No    Alcohol/week: 0.0 oz  . Drug use: No  . Sexual activity: Never    Birth control/protection: Surgical    Comment: hyst  Lifestyle  . Physical activity:    Days per week: Not on file    Minutes per session: Not on file  . Stress: Not on file  Relationships  . Social connections:    Talks on phone: Not on file    Gets together: Not on file    Attends religious service: Not on file    Active member of club or organization: Not on file    Attends meetings of clubs or organizations: Not on file    Relationship status: Not on file  . Intimate partner violence:    Fear of current or ex partner: Not on file    Emotionally abused: Not on file    Physically abused: Not on file    Forced sexual activity: Not on file  Other Topics Concern  . Not on file  Social History Narrative   Patient lives at home with husband  (Angela Buchanan).    Patient is rtired caoll   Right handed.   Caffeine 8oz . Caffeine daily    FAMILY HISTORY: Family History  Problem Relation Age of Onset  . Hypertension Sister   . Dementia Father   . Stroke Sister     Review of Systems  Constitutional: Negative for appetite change, chills, fatigue, fever and unexpected weight change.  HENT:   Negative for hearing loss, lump/mass, sore throat and trouble swallowing.   Eyes: Negative for eye  problems and icterus.  Respiratory: Negative for chest tightness, cough and shortness of breath.   Cardiovascular: Negative for chest pain, leg swelling and palpitations.  Gastrointestinal: Negative for abdominal distention, abdominal pain, constipation, diarrhea, nausea and vomiting.  Endocrine: Negative for hot flashes.  Neurological: Negative for dizziness and headaches.  Hematological: Negative for adenopathy. Does not bruise/bleed easily.  Psychiatric/Behavioral: Negative for depression. The patient is not nervous/anxious.       PHYSICAL EXAMINATION  ECOG PERFORMANCE STATUS: 1 - Symptomatic but completely ambulatory  Vitals:   05/02/18 1410  BP: 129/70  Pulse: 60  Resp: 18  Temp: 98.2 F (36.8 C)  SpO2: 99%  Physical Exam  Constitutional: She is oriented to person, place, and time. She appears well-developed and well-nourished.  HENT:  Head: Normocephalic and atraumatic.  Mouth/Throat: Oropharynx is clear and moist. No oropharyngeal exudate.  Swelling is much improved on right neck  Eyes: Pupils are equal, round, and reactive to light. No scleral icterus.  Neck: Neck supple.  Cardiovascular: Normal rate, regular rhythm and normal heart sounds.  Pulmonary/Chest: Effort normal.  Abdominal: Soft. Bowel sounds are normal.  Musculoskeletal: She exhibits no edema.  Lymphadenopathy:    She has no cervical adenopathy.  Neurological: She is alert and oriented to person, place, and time.  Skin: Skin is warm and dry. Capillary refill takes less than 2 seconds.  Psychiatric: She has a normal mood and affect.    LABORATORY DATA:  CBC    Component Value Date/Time   WBC 5.7 05/02/2018 1539   WBC 5.1 05/23/2017 1209   WBC 4.9 10/20/2011 2104   RBC 3.73 05/02/2018 1539   HGB 12.6 05/02/2018 1539   HGB 12.5 05/23/2017 1209   HCT 36.5 05/02/2018 1539   HCT 36.6 05/23/2017 1209   PLT 292 05/02/2018 1539   PLT 433 (H) 05/23/2017 1209   MCV 97.9 05/02/2018 1539   MCV 97.5  05/23/2017 1209   MCH 33.8 05/02/2018 1539   MCHC 34.5 05/02/2018 1539   RDW 12.5 05/02/2018 1539   RDW 12.9 05/23/2017 1209   LYMPHSABS 0.8 (L) 05/02/2018 1539   LYMPHSABS 1.7 05/23/2017 1209   MONOABS 0.6 05/02/2018 1539   MONOABS 0.5 05/23/2017 1209   EOSABS 0.1 05/02/2018 1539   EOSABS 0.2 05/23/2017 1209   BASOSABS 0.0 05/02/2018 1539   BASOSABS 0.1 05/23/2017 1209    CMP     Component Value Date/Time   NA 137 05/02/2018 1539   NA 138 05/23/2017 1209   K 4.2 05/02/2018 1539   K 3.8 05/23/2017 1209   CL 104 05/02/2018 1539   CO2 27 05/02/2018 1539   CO2 25 05/23/2017 1209   GLUCOSE 93 05/02/2018 1539   GLUCOSE 101 05/23/2017 1209   BUN 12 05/02/2018 1539   BUN 11.9 05/23/2017 1209   CREATININE 0.81 05/02/2018 1539   CREATININE 0.8 05/23/2017 1209   CALCIUM 9.6 05/02/2018 1539   CALCIUM 9.5 05/23/2017 1209   PROT 7.4 05/02/2018 1539   PROT 7.2 05/23/2017 1209   ALBUMIN 4.2 05/02/2018 1539   ALBUMIN 3.8 05/23/2017 1209   AST 24 05/02/2018 1539   AST 20 05/23/2017 1209   ALT 12 05/02/2018 1539   ALT 12 05/23/2017 1209   ALKPHOS 99 05/02/2018 1539   ALKPHOS 99 05/23/2017 1209   BILITOT 0.4 05/02/2018 1539   BILITOT 0.50 05/23/2017 1209   GFRNONAA >60 05/02/2018 1539   GFRAA >60 05/02/2018 1539     ASSESSMENT and PLAN:   Malignant neoplasm of upper-outer quadrant of right breast in female, estrogen receptor positive ([place]) 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 cm, 1/3 micrometastatic disease in lymph nodes ; Left lumpectomy: Complex sclerosing lesion; grade 2, ER +, PR 50%, HER-2 negative ratio 1.26, Ki-67 15%, T1 cN1 mic M0 stage IA AJCC 8  Oncotype DX score 27: Intermediate risk, ROR 12% with tamoxifen alone and 8% with tamoxifen with chemo  Adjuvant radiation therapy completed 09/27/2017 Treatment plan: Adjuvant letrozole 2.5 mg daily (stopped 12/20/17 due to arthralgias) changed to Anastrozole Carotid and RUE doppler notes right subclavian DVT started Xarelto  in 11/2017  ______________________________________________________________________________________  Mickel Baas continues on Anastrozole daily, and Xarelto.  She is tolerating both of these medications.  However, she is experiencing bone pain.  This bone pain is different and substantially worse than the arthralgias she noted while taking the Letrozole.  She says that she is taking tylenol with minimal relief.  She would also like to know if the Xarelto is helping her blood clot.  She notes that she has family members who have had strokes and she doesn't want to stop the xarelto to soon.  I reviewed this with Dr. Lindi Adie and we will do the following.    1. Continue Anastrozole and Xarelto 2. Bone scan to further evaluate bone pain 3. Repeat RUE doppler to eval clot 4. F/u with Dr. Lindi Adie in 2-4 weeks once the above scans have been completed to review scans and discuss medications.   5. Tramadol 25-70m po Q6H PRN pain.  Risks/benefits reviewed with patient.   The above plan was reviewed in detail with LHenessyand she is in agreement.      Orders Placed This Encounter  Procedures  . NM Bone Scan Whole Body    Standing Status:   Future    Standing Expiration Date:   05/02/2019    Order Specific Question:   If indicated for the ordered procedure, I authorize the administration of a radiopharmaceutical per Radiology protocol    Answer:   Yes    Order Specific Question:   [facility]    [facility]   Order Specific Question:   Radiology Contrast Protocol - do NOT remove file path    Answer:   [email or web address]  . CBC with Differential ([facility])    Standing Status:   Future    Number of Occurrences:   1    Standing Expiration Date:   05/03/2019  . CMP ([facility])    Standing Status:   Future    Number of Occurrences:   1    Standing Expiration Date:   05/03/2019    All questions were answered. The patient knows to call  the clinic with any problems, questions or concerns. We can certainly see the patient much sooner if necessary.  A total of (30) minutes of face-to-face time was spent with this patient with greater than 50% of that time in counseling and care-coordination.   This note was electronically signed. LScot Dock NP 05/03/2018

## 2018-05-02 NOTE — Patient Instructions (Signed)
Bone Health Bones protect organs, store calcium, and anchor muscles. Good health habits, such as eating nutritious foods and exercising regularly, are important for maintaining healthy bones. They can also help to prevent a condition that causes bones to lose density and become weak and brittle (osteoporosis). Why is bone mass important? Bone mass refers to the amount of bone tissue that you have. The higher your bone mass, the stronger your bones. An important step toward having healthy bones throughout life is to have strong and dense bones during childhood. A young adult who has a high bone mass is more likely to have a high bone mass later in life. Bone mass at its greatest it is called peak bone mass. A large decline in bone mass occurs in older adults. In women, it occurs about the time of menopause. During this time, it is important to practice good health habits, because if more bone is lost than what is replaced, the bones will become less healthy and more likely to break (fracture). If you find that you have a low bone mass, you may be able to prevent osteoporosis or further bone loss by changing your diet and lifestyle. How can I find out if my bone mass is low? Bone mass can be measured with an X-ray test that is called a bone mineral density (BMD) test. This test is recommended for all women who are age 65 or older. It may also be recommended for men who are age 70 or older, or for people who are more likely to develop osteoporosis due to:  Having bones that break easily.  Having a long-term disease that weakens bones, such as kidney disease or rheumatoid arthritis.  Having menopause earlier than normal.  Taking medicine that weakens bones, such as steroids, thyroid hormones, or hormone treatment for breast cancer or prostate cancer.  Smoking.  Drinking three or more alcoholic drinks each day.  What are the nutritional recommendations for healthy bones? To have healthy bones, you  need to get enough of the right minerals and vitamins. Most nutrition experts recommend getting these nutrients from the foods that you eat. Nutritional recommendations vary from person to person. Ask your health care provider what is healthy for you. Here are some general guidelines. Calcium Recommendations Calcium is the most important (essential) mineral for bone health. Most people can get enough calcium from their diet, but supplements may be recommended for people who are at risk for osteoporosis. Good sources of calcium include:  Dairy products, such as low-fat or nonfat milk, cheese, and yogurt.  Dark green leafy vegetables, such as bok choy and broccoli.  Calcium-fortified foods, such as orange juice, cereal, bread, soy beverages, and tofu products.  Nuts, such as almonds.  Follow these recommended amounts for daily calcium intake:  Children, age 1?3: 700 mg.  Children, age 4?8: 1,000 mg.  Children, age 9?13: 1,300 mg.  Teens, age 14?18: 1,300 mg.  Adults, age 19?50: 1,000 mg.  Adults, age 51?70: ? Men: 1,000 mg. ? Women: 1,200 mg.  Adults, age 71 or older: 1,200 mg.  Pregnant and breastfeeding females: ? Teens: 1,300 mg. ? Adults: 1,000 mg.  Vitamin D Recommendations Vitamin D is the most essential vitamin for bone health. It helps the body to absorb calcium. Sunlight stimulates the skin to make vitamin D, so be sure to get enough sunlight. If you live in a cold climate or you do not get outside often, your health care provider may recommend that you take vitamin   D supplements. Good sources of vitamin D in your diet include:  Egg yolks.  Saltwater fish.  Milk and cereal fortified with vitamin D.  Follow these recommended amounts for daily vitamin D intake:  Children and teens, age 1?18: 600 international units.  Adults, age 50 or younger: 400-800 international units.  Adults, age 51 or older: 800-1,000 international units.  Other Nutrients Other nutrients  for bone health include:  Phosphorus. This mineral is found in meat, poultry, dairy foods, nuts, and legumes. The recommended daily intake for adult men and adult women is 700 mg.  Magnesium. This mineral is found in seeds, nuts, dark green vegetables, and legumes. The recommended daily intake for adult men is 400?420 mg. For adult women, it is 310?320 mg.  Vitamin K. This vitamin is found in green leafy vegetables. The recommended daily intake is 120 mg for adult men and 90 mg for adult women.  What type of physical activity is best for building and maintaining healthy bones? Weight-bearing and strength-building activities are important for building and maintaining peak bone mass. Weight-bearing activities cause muscles and bones to work against gravity. Strength-building activities increases muscle strength that supports bones. Weight-bearing and muscle-building activities include:  Walking and hiking.  Jogging and running.  Dancing.  Gym exercises.  Lifting weights.  Tennis and racquetball.  Climbing stairs.  Aerobics.  Adults should get at least 30 minutes of moderate physical activity on most days. Children should get at least 60 minutes of moderate physical activity on most days. Ask your health care provide what type of exercise is best for you. Where can I find more information? For more information, check out the following websites:  National Osteoporosis Foundation: http://nof.org/learn/basics  National Institutes of Health: http://www.niams.nih.gov/Health_Info/Bone/Bone_Health/bone_health_for_life.asp  This information is not intended to replace advice given to you by your health care provider. Make sure you discuss any questions you have with your health care provider. Document Released: 12/09/2003 Document Revised: 04/07/2016 Document Reviewed: 09/23/2014 Elsevier Interactive Patient Education  2018 Elsevier Inc.  

## 2018-05-02 NOTE — Telephone Encounter (Signed)
Gave avs and calendar ° °

## 2018-05-07 ENCOUNTER — Ambulatory Visit (HOSPITAL_COMMUNITY)
Admission: RE | Admit: 2018-05-07 | Discharge: 2018-05-07 | Disposition: A | Discharge status: Home / Self Care | Payer: Medicare Other | Source: Ambulatory Visit | Attending: Adult Health | Admitting: Adult Health

## 2018-05-07 DIAGNOSIS — I82621 Acute embolism and thrombosis of deep veins of right upper extremity: Secondary | ICD-10-CM | POA: Insufficient documentation

## 2018-05-07 NOTE — Progress Notes (Signed)
*  Preliminary Results* Right upper extremity venous duplex completed. Right upper extremity is positive for acute deep vein thrombosis involving the right subclavian vein. When compared to the previous study from 12/28/2017, there does not appear to be any change.  Attempted to call Wilber Bihari, [place], Dr. Lindi Adie, Dr. Geralyn Flash nurse, and [facility] triage, all with no answer. The patient will be discharged and can be reached by phone for further instructions.  05/07/2018 10:40 AM  Maudry Mayhew, BS, RVT, RDCS, RDMS

## 2018-05-16 ENCOUNTER — Encounter (HOSPITAL_COMMUNITY)
Admission: RE | Admit: 2018-05-16 | Discharge: 2018-05-16 | Disposition: A | Discharge status: Home / Self Care | Payer: Medicare Other | Source: Ambulatory Visit | Attending: Adult Health | Admitting: Adult Health

## 2018-05-16 DIAGNOSIS — Z170 Estrogen receptor positive status [ER+]: Secondary | ICD-10-CM | POA: Diagnosis present

## 2018-05-16 DIAGNOSIS — C50411 Malignant neoplasm of upper-outer quadrant of right female breast: Secondary | ICD-10-CM | POA: Insufficient documentation

## 2018-05-16 DIAGNOSIS — M898X9 Other specified disorders of bone, unspecified site: Secondary | ICD-10-CM | POA: Diagnosis present

## 2018-05-16 MED ORDER — TECHNETIUM TC 99M MEDRONATE IV KIT
20.0000 | PACK | Freq: Once | INTRAVENOUS | Status: DC | PRN
Start: 2018-05-16 — End: 2018-05-22

## 2018-05-21 ENCOUNTER — Telehealth: Payer: Self-pay

## 2018-05-21 NOTE — Telephone Encounter (Signed)
-----   Message from Gardenia Phlegm, NP sent at 05/21/2018  1:17 PM EDT ----- Please tell patient that her bone scan is normal there is no cancer in the bone ----- Message ----- From: Interface, Rad Results In Sent: 05/17/2018   7:14 AM EDT To: Gardenia Phlegm, NP

## 2018-05-21 NOTE — Telephone Encounter (Signed)
Spoke with patient informing of normal bone scan results.  Patient very happy to hear.  Voiced understanding of information.  Knows to call center with any questions/concerns.

## 2018-05-23 ENCOUNTER — Inpatient Hospital Stay (HOSPITAL_BASED_OUTPATIENT_CLINIC_OR_DEPARTMENT_OTHER): Payer: Medicare Other | Admitting: Hematology and Oncology

## 2018-05-23 ENCOUNTER — Telehealth: Payer: Self-pay | Admitting: Hematology and Oncology

## 2018-05-23 VITALS — BP 151/73 | HR 67 | Temp 98.3°F | Resp 17 | Ht 62.0 in | Wt 115.6 lb

## 2018-05-23 DIAGNOSIS — M898X9 Other specified disorders of bone, unspecified site: Secondary | ICD-10-CM | POA: Diagnosis not present

## 2018-05-23 DIAGNOSIS — Z79811 Long term (current) use of aromatase inhibitors: Secondary | ICD-10-CM

## 2018-05-23 DIAGNOSIS — C50411 Malignant neoplasm of upper-outer quadrant of right female breast: Secondary | ICD-10-CM

## 2018-05-23 DIAGNOSIS — I82621 Acute embolism and thrombosis of deep veins of right upper extremity: Secondary | ICD-10-CM

## 2018-05-23 DIAGNOSIS — Z170 Estrogen receptor positive status [ER+]: Secondary | ICD-10-CM

## 2018-05-23 DIAGNOSIS — I82B11 Acute embolism and thrombosis of right subclavian vein: Secondary | ICD-10-CM | POA: Diagnosis not present

## 2018-05-23 DIAGNOSIS — Z7901 Long term (current) use of anticoagulants: Secondary | ICD-10-CM

## 2018-05-23 DIAGNOSIS — Z923 Personal history of irradiation: Secondary | ICD-10-CM

## 2018-05-23 MED ORDER — APIXABAN 5 MG PO TABS: 5.0000 mg | ORAL_TABLET | Freq: Two times a day (BID) | ORAL | 5 refills | Status: DC

## 2018-05-23 NOTE — Telephone Encounter (Signed)
Gave avs and calendar ° °

## 2018-05-23 NOTE — Progress Notes (Signed)
Patient Care Team: [name], MD as PCP - General (Family Medicine) Fanny Skates, MD as Consulting Physician (General Surgery) Nicholas Lose, MD as Consulting Physician (Hematology and Oncology) Kyung Rudd, MD as Consulting Physician (Radiation Oncology) Delice Bison Charlestine Massed, NP as Nurse Practitioner (Hematology and Oncology)  DIAGNOSIS:  Encounter Diagnoses  Name Primary?  . Malignant neoplasm of upper-outer quadrant of right breast in female, estrogen receptor positive ([place])   . Acute deep vein thrombosis (DVT) of other vein of right upper extremity (HCC) Yes    SUMMARY OF ONCOLOGIC HISTORY:   Malignant neoplasm of upper-outer quadrant of right breast in female, estrogen receptor positive ([place])   05/16/2017 Initial Diagnosis    Left breast biopsy UIQ: Complex sclerosing lesion with calcifications and sclerosing adenosis, right breast biopsy 10:00: IDC grade 2, DCIS, ER/PR positive, HER-2 negative ratio 1.26,  11 mm lesion in the right breast at 10:00 position: T1b N0 stage IA clinical stage     06/22/2017 Surgery    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 cm, 1/3 micrometastatic disease in lymph nodes ; Left lumpectomy: Complex sclerosing lesion; grade 2, ER +, PR 50%, HER-2 negative ratio 1.26, Ki-67 15%, T1 cN1 mic M0 stage IA AJCC 8     08/07/2017 Oncotype testing    Oncotype DX score 27: Intermediate risk, 10 year risk of distant recurrence with tamoxifen alone 12% and chemotherapy plus tamoxifen 8%      08/09/2017 - 09/27/2017 Radiation Therapy    Adjuvant radiation therapy    10/2017 -  Anti-estrogen oral therapy    Letrozole daily, couldn't tolerate due to arthralgias, changed to Anastrozole in 11/2017     CHIEF COMPLIANT: Follow-up to review the results of bone scan as well as ultrasound of the right upper extremity  INTERVAL HISTORY: Angela Buchanan is a 67 year old with above-mentioned history of right breast cancer who developed a DVT of the right upper extremity  and has been on Xarelto.  She had a repeat ultrasound which showed persistence of the blood clot.  She reports that Xarelto has been causing her diffuse aches and pains.  She would like to switch to a different anticoagulant.  REVIEW OF SYSTEMS:   Constitutional: Denies fevers, chills or abnormal weight loss Eyes: Denies blurriness of vision Ears, nose, mouth, throat, and face: Denies mucositis or sore throat Respiratory: Denies cough, dyspnea or wheezes Cardiovascular: Denies palpitation, chest discomfort Gastrointestinal:  Denies nausea, heartburn or change in bowel habits Skin: Denies abnormal skin rashes Lymphatics: Denies new lymphadenopathy or easy bruising Neurological:Denies numbness, tingling or new weaknesses Behavioral/Psych: Mood is stable, no new changes  Extremities: No lower extremity edema Breast:  denies any pain or lumps or nodules in either breasts All other systems were reviewed with the patient and are negative.  I have reviewed the past medical history, past surgical history, social history and family history with the patient and they are unchanged from previous note.  ALLERGIES:  is allergic to atenolol; bisoprolol; prednisone; and latex.  MEDICATIONS:  Current Outpatient Medications  Medication Sig Dispense Refill  . anastrozole (ARIMIDEX) 1 MG tablet Take 1 tablet (1 mg total) by mouth daily. 30 tablet 2  . apixaban (ELIQUIS) 5 MG TABS tablet Take 1 tablet (5 mg total) by mouth 2 (two) times daily. 60 tablet 5  . b complex vitamins tablet Take 1 tablet by mouth daily.    . Calcium Citrate 250 MG TABS Take 250 mg by mouth 4 (four) times daily.    Marland Kitchen  cholecalciferol (VITAMIN D) 1000 units tablet Take 2,000 Units by mouth daily.    . citalopram (CELEXA) 10 MG tablet Take 10 mg by mouth daily as needed (anxiety).     . fluticasone (FLONASE) 50 MCG/ACT nasal spray     . lisinopril (PRINIVIL,ZESTRIL) 20 MG tablet Take 1 tablet (20 mg total) by mouth daily. 90 tablet 1   . montelukast (SINGULAIR) 10 MG tablet Take 10 mg by mouth at bedtime.     . Multiple Vitamin (MULTIVITAMIN WITH MINERALS) TABS tablet Take 1 tablet by mouth daily.    . traMADol (ULTRAM) 50 MG tablet Take 0.5-1 tablets (25-50 mg total) by mouth every 6 (six) hours as needed. 30 tablet 0   No current facility-administered medications for this visit.     PHYSICAL EXAMINATION: ECOG PERFORMANCE STATUS: 1 - Symptomatic but completely ambulatory  Vitals:   05/23/18 1445  BP: (!) 151/73  Pulse: 67  Resp: 17  Temp: 98.3 F (36.8 C)  SpO2: 99%   Filed Weights   05/23/18 1445  Weight: 115 lb 9.6 oz (52.4 kg)    GENERAL:alert, no distress and comfortable SKIN: skin color, texture, turgor are normal, no rashes or significant lesions EYES: normal, Conjunctiva are pink and non-injected, sclera clear OROPHARYNX:no exudate, no erythema and lips, buccal mucosa, and tongue normal  NECK: supple, thyroid normal size, non-tender, without nodularity LYMPH:  no palpable lymphadenopathy in the cervical, axillary or inguinal LUNGS: clear to auscultation and percussion with normal breathing effort HEART: regular rate & rhythm and no murmurs and no lower extremity edema ABDOMEN:abdomen soft, non-tender and normal bowel sounds MUSCULOSKELETAL:no cyanosis of digits and no clubbing  NEURO: alert & oriented x 3 with fluent speech, no focal motor/sensory deficits EXTREMITIES: No lower extremity edema   LABORATORY DATA:  I have reviewed the data as listed CMP Latest Ref Rng & Units 05/02/2018 05/23/2017 10/20/2011  Glucose 70 - 99 mg/dL 93 101 98  BUN 8 - 23 mg/dL 12 11.9 9  Creatinine 0.44 - 1.00 mg/dL 0.81 0.8 0.70  Sodium 135 - 145 mmol/L 137 138 139  Potassium 3.5 - 5.1 mmol/L 4.2 3.8 3.5  Chloride 98 - 111 mmol/L 104 - 103  CO2 22 - 32 mmol/L '27 25 25  ' Calcium 8.9 - 10.3 mg/dL 9.6 9.5 9.8  Total Protein 6.5 - 8.1 g/dL 7.4 7.2 7.7  Total Bilirubin 0.3 - 1.2 mg/dL 0.4 0.50 0.3  Alkaline Phos 38 -  126 U/L 99 99 63  AST 15 - 41 U/L '24 20 25  ' ALT 0 - 44 U/L '12 12 15    ' Lab Results  Component Value Date   WBC 5.7 05/02/2018   HGB 12.6 05/02/2018   HCT 36.5 05/02/2018   MCV 97.9 05/02/2018   PLT 292 05/02/2018   NEUTROABS 4.2 05/02/2018    ASSESSMENT & PLAN:  Malignant neoplasm of upper-outer quadrant of right breast in female, estrogen receptor positive (HCC) 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 cm, 1/3 micrometastatic disease in lymph nodes ; Left lumpectomy: Complex sclerosing lesion; grade 2, ER +, PR 50%, HER-2 negative ratio 1.26, Ki-67 15%, T1 cN1 mic M0 stage IA AJCC 8  Oncotype DX score 27: Intermediate risk, ROR 12% with tamoxifen alone and 8% with tamoxifen with chemo  Adjuvant radiation therapy completed 09/27/2017 Current treatment: Adjuvant letrozole 2.5 mg daily (stopped 12/20/17 due to arthralgias) changed to Anastrozole Carotid and RUE doppler notes right subclavian DVT started Xarelto in 11/2017  ______________________________________________________________________________________ Bone scan 05/16/2018:  No evidence of bone metastases  1.  Mammogram 04/25/2018: No evidence of malignancy 2. Repeat RUE doppler 05/07/2018: Evidence of acute DVT in the subclavian vein unchanged from before.  Xarelto toxicities: Diffuse body aches and pains.  She would like to switch to a different anticoagulant. I recommended Eliquis.  Patient's creatinine is normal.  She will take 5 mg twice a day for 6 months. We will recheck ultrasound in 6 months and follow-up to decide if she needs to stay on it further.  We will recheck with another ultrasound at that time.     No orders of the defined types were placed in this encounter.  The patient has a good understanding of the overall plan. she agrees with it. she will call with any problems that may develop before the next visit here.   Harriette Ohara, MD 05/23/18

## 2018-05-23 NOTE — Assessment & Plan Note (Signed)
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 cm, 1/3 micrometastatic disease in lymph nodes ; Left lumpectomy: Complex sclerosing lesion; grade 2, ER +, PR 50%, HER-2 negative ratio 1.26, Ki-67 15%, T1 cN1 mic M0 stage IA AJCC 8  Oncotype DX score 27: Intermediate risk, ROR 12% with tamoxifen alone and 8% with tamoxifen with chemo  Adjuvant radiation therapy completed 09/27/2017 Current treatment: Adjuvant letrozole 2.5 mg daily (stopped 12/20/17 due to arthralgias) changed to Anastrozole Carotid and RUE doppler notes right subclavian DVT started Xarelto in 11/2017  ______________________________________________________________________________________ Bone scan 05/16/2018: No evidence of bone metastases  1.  Mammogram 04/25/2018: No evidence of malignancy 2. Repeat RUE doppler 05/07/2018: Evidence of acute DVT in the subclavian vein unchanged from before.  Continue with Xarelto for another 6 more months. We will recheck with another ultrasound at that time.

## 2018-06-24 ENCOUNTER — Ambulatory Visit: Payer: Medicare Other | Admitting: Hematology and Oncology

## 2018-07-31 ENCOUNTER — Other Ambulatory Visit: Payer: Self-pay | Admitting: Family Medicine

## 2018-07-31 DIAGNOSIS — E041 Nontoxic single thyroid nodule: Secondary | ICD-10-CM

## 2018-08-06 ENCOUNTER — Ambulatory Visit
Admission: RE | Admit: 2018-08-06 | Discharge: 2018-08-06 | Disposition: A | Discharge status: Home / Self Care | Payer: Medicare Other | Source: Ambulatory Visit | Attending: Family Medicine | Admitting: Family Medicine

## 2018-08-06 DIAGNOSIS — E041 Nontoxic single thyroid nodule: Secondary | ICD-10-CM

## 2018-11-18 ENCOUNTER — Ambulatory Visit (HOSPITAL_COMMUNITY)
Admission: RE | Admit: 2018-11-18 | Discharge: 2018-11-18 | Disposition: A | Discharge status: Home / Self Care | Payer: Medicare Other | Source: Ambulatory Visit | Attending: Hematology and Oncology | Admitting: Hematology and Oncology

## 2018-11-18 ENCOUNTER — Encounter (INDEPENDENT_AMBULATORY_CARE_PROVIDER_SITE_OTHER): Payer: Self-pay

## 2018-11-18 DIAGNOSIS — I82621 Acute embolism and thrombosis of deep veins of right upper extremity: Secondary | ICD-10-CM | POA: Diagnosis not present

## 2018-11-18 NOTE — Progress Notes (Signed)
Right upper extremity venous duplex completed. Refer to "CV Proc" under chart review to view preliminary results.  11/18/2018 10:05 AM Maudry Mayhew, MHA, RVT, RDCS, RDMS

## 2018-11-26 ENCOUNTER — Inpatient Hospital Stay: Payer: Medicare Other | Admitting: Hematology and Oncology

## 2018-12-19 NOTE — Progress Notes (Signed)
HEMATOLOGY-ONCOLOGY TELEPHONE VISIT PROGRESS NOTE  I connected with Angela Buchanan on 12/24/18 at  1:45 PM EDT by telephone and verified that I am speaking with the correct person using two identifiers.  I discussed the limitations, risks, security and privacy concerns of performing an evaluation and management service by telephone and the availability of in person appointments.  I also discussed with the patient that there may be a patient responsible charge related to this service. The patient expressed understanding and agreed to proceed.   History of Present Illness: Follow-up of DVT and breast cancer    Malignant neoplasm of upper-outer quadrant of right breast in female, estrogen receptor positive ([place])   05/16/2017 Initial Diagnosis    Left breast biopsy UIQ: Complex sclerosing lesion with calcifications and sclerosing adenosis, right breast biopsy 10:00: IDC grade 2, DCIS, ER/PR positive, HER-2 negative ratio 1.26,  11 mm lesion in the right breast at 10:00 position: T1b N0 stage IA clinical stage     06/22/2017 Surgery    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 cm, 1/3 micrometastatic disease in lymph nodes ; Left lumpectomy: Complex sclerosing lesion; grade 2, ER +, PR 50%, HER-2 negative ratio 1.26, Ki-67 15%, T1 cN1 mic M0 stage IA AJCC 8     08/07/2017 Oncotype testing    Oncotype DX score 27: Intermediate risk, 10 year risk of distant recurrence with tamoxifen alone 12% and chemotherapy plus tamoxifen 8%      08/09/2017 - 09/27/2017 Radiation Therapy    Adjuvant radiation therapy    10/2017 -  Anti-estrogen oral therapy    Letrozole daily, couldn't tolerate due to arthralgias, changed to Anastrozole in 11/2017     Observations/Objective: Joint pains , Shingles in summer (profound) Patient had severe shingles which led to discontinuation of antiestrogen therapy.  She is finally recovering from shingles.   Assessment Plan:  Malignant neoplasm of upper-outer quadrant of right breast  in female, estrogen receptor positive ([place]) 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 cm, 1/3 micrometastatic disease in lymph nodes ; Left lumpectomy: Complex sclerosing lesion; grade 2, ER +, PR 50%, HER-2 negative ratio 1.26, Ki-67 15%, T1 cN1 mic M0 stage IA AJCC 8  Oncotype DX score 27: Intermediate risk, ROR 12% with tamoxifen alone and 8% with tamoxifen with chemo  Adjuvant radiation therapy completed 09/27/2017 Current treatment: Adjuvant letrozole 2.5 mg daily (stopped 12/20/17 due to arthralgias) changed to Anastrozole Patient has not been taking anastrozole for the past 6 months.  She would like to resume taking anastrozole at this time. She had mild to moderate arthralgias.  Bone scan 05/16/2018: No evidence of bone metastases ______________________________________________________________________________________  Carotid and RUE doppler notes right subclavian DVT started Xarelto in 11/2017 1.  Mammogram 04/25/2018: No evidence of malignancy 2. Ultrasound right upper extremity 11/18/2018: No evidence of DVT in the upper extremity: We will discontinue Eliquis at this time.  Because of extensive family history of strokes, I recommended that she start taking aspirin 81 mg daily. Mammogram has been scheduled for July 2020.  Return to clinic in 1 year for follow-up    I discussed the assessment and treatment plan with the patient. The patient was provided an opportunity to ask questions and all were answered. The patient agreed with the plan and demonstrated an understanding of the instructions. The patient was advised to call back or seek an in-person evaluation if the symptoms worsen or if the condition fails to improve as anticipated.   I provided 15 minutes of non-face-to-face time  during this encounter. Harriette Ohara, MD

## 2018-12-23 ENCOUNTER — Other Ambulatory Visit: Payer: Self-pay | Admitting: Adult Health

## 2018-12-23 DIAGNOSIS — Z853 Personal history of malignant neoplasm of breast: Secondary | ICD-10-CM

## 2018-12-24 ENCOUNTER — Inpatient Hospital Stay: Payer: Medicare Other | Attending: Hematology and Oncology | Admitting: Hematology and Oncology

## 2018-12-24 DIAGNOSIS — C50411 Malignant neoplasm of upper-outer quadrant of right female breast: Secondary | ICD-10-CM

## 2018-12-24 DIAGNOSIS — Z170 Estrogen receptor positive status [ER+]: Secondary | ICD-10-CM | POA: Diagnosis not present

## 2018-12-24 MED ORDER — ANASTROZOLE 1 MG PO TABS: 1.0000 mg | ORAL_TABLET | Freq: Every day | ORAL | 2 refills | Status: DC

## 2018-12-24 MED ORDER — ASPIRIN EC 81 MG PO TBEC: 81.0000 mg | DELAYED_RELEASE_TABLET | Freq: Every day | ORAL | Status: DC

## 2018-12-24 NOTE — Assessment & Plan Note (Addendum)
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 cm, 1/3 micrometastatic disease in lymph nodes ; Left lumpectomy: Complex sclerosing lesion; grade 2, ER +, PR 50%, HER-2 negative ratio 1.26, Ki-67 15%, T1 cN1 mic M0 stage IA AJCC 8  Oncotype DX score 27: Intermediate risk, ROR 12% with tamoxifen alone and 8% with tamoxifen with chemo  Adjuvant radiation therapy completed 09/27/2017 Current treatment: Adjuvant letrozole 2.5 mg daily (stopped 12/20/17 due to arthralgias) changed to Anastrozole Carotid and RUE doppler notes right subclavian DVT started Xarelto in 11/2017  ______________________________________________________________________________________ Bone scan 05/16/2018: No evidence of bone metastases  1.  Mammogram 04/25/2018: No evidence of malignancy 2. Repeat RUE doppler 05/07/2018: Evidence of acute DVT in the subclavian vein unchanged from before.  Xarelto toxicities: Diffuse body aches and pains.    Switched to [employer].  Ultrasound right upper extremity 11/18/2018: No evidence of DVT in the upper extremity  I recommended discontinuation of anticoagulation.  Return to clinic in 1 year for follow-up

## 2018-12-24 NOTE — Addendum Note (Signed)
Addended by: Nicholas Lose on: 12/24/2018 01:56 PM   Modules accepted: Level of Service

## 2019-04-29 ENCOUNTER — Ambulatory Visit
Admission: RE | Admit: 2019-04-29 | Discharge: 2019-04-29 | Disposition: A | Discharge status: Home / Self Care | Payer: Medicare Other | Source: Ambulatory Visit | Attending: Adult Health | Admitting: Adult Health

## 2019-04-29 ENCOUNTER — Other Ambulatory Visit: Payer: Self-pay

## 2019-04-29 DIAGNOSIS — Z853 Personal history of malignant neoplasm of breast: Secondary | ICD-10-CM

## 2019-07-31 ENCOUNTER — Other Ambulatory Visit: Payer: Self-pay | Admitting: Family Medicine

## 2019-07-31 DIAGNOSIS — Z853 Personal history of malignant neoplasm of breast: Secondary | ICD-10-CM

## 2019-10-28 ENCOUNTER — Ambulatory Visit: Payer: Medicare PPO | Admitting: Cardiovascular Disease

## 2019-11-13 ENCOUNTER — Other Ambulatory Visit: Payer: Self-pay

## 2019-11-13 ENCOUNTER — Encounter: Payer: Self-pay | Admitting: Cardiovascular Disease

## 2019-11-13 ENCOUNTER — Ambulatory Visit: Payer: Medicare PPO | Admitting: Cardiovascular Disease

## 2019-11-13 ENCOUNTER — Telehealth: Payer: Self-pay | Admitting: Radiology

## 2019-11-13 VITALS — BP 164/80 | HR 62 | Temp 95.0°F | Ht 62.0 in | Wt 120.8 lb

## 2019-11-13 DIAGNOSIS — R0989 Other specified symptoms and signs involving the circulatory and respiratory systems: Secondary | ICD-10-CM

## 2019-11-13 DIAGNOSIS — R002 Palpitations: Secondary | ICD-10-CM

## 2019-11-13 DIAGNOSIS — I10 Essential (primary) hypertension: Secondary | ICD-10-CM

## 2019-11-13 DIAGNOSIS — I498 Other specified cardiac arrhythmias: Secondary | ICD-10-CM

## 2019-11-13 DIAGNOSIS — R42 Dizziness and giddiness: Secondary | ICD-10-CM

## 2019-11-13 DIAGNOSIS — E7800 Pure hypercholesterolemia, unspecified: Secondary | ICD-10-CM

## 2019-11-13 HISTORY — DX: Pure hypercholesterolemia, unspecified: E78.00

## 2019-11-13 HISTORY — DX: Other specified symptoms and signs involving the circulatory and respiratory systems: R09.89

## 2019-11-13 HISTORY — DX: Other specified cardiac arrhythmias: I49.8

## 2019-11-13 MED ORDER — LISINOPRIL 40 MG PO TABS: 40.0000 mg | ORAL_TABLET | Freq: Every day | ORAL | 3 refills | Status: DC

## 2019-11-13 NOTE — Progress Notes (Signed)
Cardiology Office Note   Date:  11/13/2019   ID:  Angela Buchanan, DOB Apr 18, 1951, MRN [id number]  PCP:  [name], MD  Cardiologist:   Skeet Latch, MD   No chief complaint on file.    History of Present Illness: Angela Buchanan is a 69 y.o. female with Celiac disease, breast cancer s/p lumpectomy and XRT, R subclavian DVT,  who is being seen today for the evaluation of palpitations and CV risk assessmetn at the request of [name], MD.  Angela Buchanan reports that she feels her heart fluttering randomly.  It lasts for several minutes at a time.  There is no associated chest or shortness of breath.  However it does make her lightheaded at times.  She also has episodes of lightheadness that are not associated with the palpitations.  She gets plenty of exercise by walking regularly.  She has no exertional chest pain or shortness of breath.  In time she has palpitations with exertion.  She denies lower extremity edema, orthopnea, or PND.  She has been under a lot of stress lately.  Her husband is sick with cancer currently getting treatment.  She notes that her blood pressure has been high lately.  She thinks this is because of the stress.  She has several family members who have had strokes.  She is very fearful of this happening to her as well  In 2017 Angela Buchanan saw Dr. Irish Lack for palpitations.  She wore an ambulatory monitor that revealed sinus rhythm with frequent PVCs.  She also had ventricular bigeminy and trigeminy.  She had a Lexiscan Myoview in 2015 that was negative for ischemia.  It was not gated 2/2 PVCs.    Past Medical History:  Diagnosis Date  . Bigeminy    no current med.  . Breast cancer ([place]) 06/2017   right  . Bruises easily   . Dental crowns present   . History of diverticulitis   . Hypertension    states under control with med., has been on med. x 5 yr.  . Personal history of radiation therapy    2018  . 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 Seasonal  allergies     Past Surgical History:  Procedure Laterality Date  . ABDOMINAL HYSTERECTOMY     partial  . BREAST EXCISIONAL BIOPSY Right 06/22/2017   Malignant  . BREAST LUMPECTOMY Right 06/22/2017   Malignant  . BREAST LUMPECTOMY WITH RADIOACTIVE SEED AND SENTINEL LYMPH NODE BIOPSY Right 06/22/2017   Procedure: RIGHT BREAST LUMPECTOMY WITH RADIOACTIVE SEED AND RIGHT AXILLARY DEEP SENTINEL LYMPH NODE BIOPSY WITH BLUE DYE INJECTION;  Surgeon: Fanny Skates, MD;  Location: [facility];  Service: General;  Laterality: Right;  . BREAST LUMPECTOMY WITH RADIOACTIVE SEED LOCALIZATION Left 06/22/2017   Procedure: LEFT BREAST LUMPECTOMY WITH RADIOACTIVE SEED LOCALIZATION;  Surgeon: Fanny Skates, MD;  Location: [facility];  Service: General;  Laterality: Left;  . CATARACT EXTRACTION W/ INTRAOCULAR LENS  IMPLANT, BILATERAL Bilateral   . EXCISION OF BREAST BIOPSY Left 06/22/2017   benign  . TONSILLECTOMY     age 62     Current Outpatient Medications  Medication Sig Dispense Refill  . anastrozole (ARIMIDEX) 1 MG tablet Take 1 tablet (1 mg total) by mouth daily. 30 tablet 2  . aspirin EC 81 MG tablet Take 1 tablet (81 mg total) by mouth daily.    Marland Kitchen b complex vitamins tablet Take 1 tablet by mouth daily.    Marland Kitchen  Calcium Citrate 250 MG TABS Take 250 mg by mouth 4 (four) times daily.    . cholecalciferol (VITAMIN D) 1000 units tablet Take 2,000 Units by mouth daily.    . citalopram (CELEXA) 10 MG tablet Take 10 mg by mouth daily as needed (anxiety).     . fluticasone (FLONASE) 50 MCG/ACT nasal spray     . levocetirizine (XYZAL) 5 MG tablet levocetirizine 5 mg tablet  TAKE 1 TABLET BY MOUTH IN THE EVENING    . lisinopril (PRINIVIL,ZESTRIL) 20 MG tablet Take 1 tablet (20 mg total) by mouth daily. 90 tablet 1  . montelukast (SINGULAIR) 10 MG tablet Take 10 mg by mouth at bedtime.     . Multiple Vitamin (MULTIVITAMIN WITH MINERALS) TABS tablet Take 1 tablet by mouth daily.     . traMADol (ULTRAM) 50 MG tablet Take 0.5-1 tablets (25-50 mg total) by mouth every 6 (six) hours as needed. 30 tablet 0  . levothyroxine (SYNTHROID) 25 MCG tablet levothyroxine 25 mcg tablet  TAKE 1 TABLET BY MOUTH EVERY DAY     No current facility-administered medications for this visit.    Allergies:   Atenolol, Bisoprolol, Prednisone, and Latex    Social History:  The patient  reports that she has never smoked. She has never used smokeless tobacco. She reports that she does not drink alcohol or use drugs.   Family History:  The patient's family history includes Dementia in her father; Hypertension in her sister; Stroke in her sister.    ROS:  Please see the history of present illness.   Otherwise, review of systems are positive for none.   All other systems are reviewed and negative.    PHYSICAL EXAM: VS:  BP (!) 156/80   Pulse 62   Temp (!) 95 F (35 C)   Ht 5\' 2"  (1.575 m)   Wt 120 lb 12.8 oz (54.8 kg)   LMP  (LMP Unknown)   SpO2 99%   BMI 22.09 kg/m  , BMI Body mass index is 22.09 kg/m. GENERAL:  Well appearing HEENT:  Pupils equal round and reactive, fundi not visualized, oral mucosa unremarkable NECK:  No jugular venous distention, waveform within normal limits, carotid upstroke brisk and symmetric, R carotid bruit, no thyromegaly LYMPHATICS:  No cervical adenopathy LUNGS:  Clear to auscultation bilaterally HEART:  RRR.  PMI not displaced or sustained,S1 and S2 within normal limits, no S3, no S4, no clicks, no rubs, no murmurs ABD:  Flat, positive bowel sounds normal in frequency in pitch, no bruits, no rebound, no guarding, no midline pulsatile mass, no hepatomegaly, no splenomegaly EXT:  2 plus pulses throughout, no edema, no cyanosis no clubbing SKIN:  No rashes no nodules NEURO:  Cranial nerves II through XII grossly intact, motor grossly intact throughout PSYCH:  Cognitively intact, oriented to person place and time   EKG:  EKG is ordered today. The ekg  ordered today demonstrates sinus rhythm.  Rate 62 bpm. Nonspecific ST changes.  Ambulatory monitor 08/17/16:  NSR with PVCs, trigeminy, bigeminy intermittently.  No AFib or pathologic tachyarrhythmias noted.   Lexiscan Myoview 07/30/14:  Scattered PVCs.  No ischemia.  Not gated due to frequent ectopy.  Recent Labs: No results found for requested labs within last 8760 hours.    Lipid Panel No results found for: CHOL, TRIG, HDL, CHOLHDL, VLDL, LDLCALC, LDLDIRECT    Wt Readings from Last 3 Encounters:  11/13/19 120 lb 12.8 oz (54.8 kg)  05/23/18 115 lb 9.6 oz (52.4  kg)  05/02/18 117 lb 14.4 oz (53.5 kg)      ASSESSMENT AND PLAN:  # Palpitations:  # PVCs: # Ventricular bigeminy/trigeminy:  Angela Buchanan has a history of frequent PVCs.  However her episodes now seem to last more than a few seconds at a time.  Given her family history of recurrent strokes we will get an ambulatory monitor to ensure she is not having atrial fibrillation.  We will also check a CMP, magnesium, TSH, free T4, and CBC.  # Bruits:   Carotid bruit noted on exam.  Check carotid Dopplers.  Continue aspirin 81 mg.  # Essential hypertension: Blood pressure is poorly-controlled.  Increase lisinopril to 40 mg.  Check CMP in 1 week.  # CV Disease Prevention: Check fasting lipids.  Current medicines are reviewed at length with the patient today.  The patient does not have concerns regarding medicines.  The following changes have been made: Increase lisinopril  Labs/ tests ordered today include:  No orders of the defined types were placed in this encounter.    Disposition:   FU with Angela Matura C. Oval Linsey, MD, [facility] in 2 months.     Signed, Angela Caldron C. Oval Linsey, MD, [facility]  11/13/2019 2:12 PM    [facility]

## 2019-11-13 NOTE — Telephone Encounter (Signed)
Enrolled patient for a 30 day [employer] Event monitor to be mailed to patients home.  

## 2019-11-13 NOTE — Patient Instructions (Addendum)
Medication Instructions:  INCREASE LISINOPRIL TO 40 MG DAILY  *If you need a refill on your cardiac medications before your next appointment, please call your pharmacy*  Lab Work: FASTING LP/CMET/THS/FT4/CBC/MAGNESIUM IN 1 WEEK   If you have labs (blood work) drawn today and your tests are completely normal, you will receive your results only by: Marland Kitchen MyChart Message (if you have MyChart) OR . A paper copy in the mail If you have any lab test that is abnormal or we need to change your treatment, we will call you to review the results.  Testing/Procedures: Your physician has requested that you have an echocardiogram. Echocardiography is a painless test that uses sound waves to create images of your heart. It provides your doctor with information about the size and shape of your heart and how well your heart's chambers and valves are working. This procedure takes approximately one hour. There are no restrictions for this procedure. [address]   Your physician has requested that you have a carotid duplex. This test is an ultrasound of the carotid arteries in your neck. It looks at blood flow through these arteries that supply the brain with blood. Allow one hour for this exam. There are no restrictions or special instructions.  Your physician has recommended that you wear an event monitor. Event monitors are medical devices that record the heart's electrical activity. Doctors most often [place] these monitors to diagnose arrhythmias. Arrhythmias are problems with the speed or rhythm of the heartbeat. The monitor is a small, portable device. You can wear one while you do your normal daily activities. This is usually used to diagnose what is causing palpitations/syncope (passing out). 7 DAY, THE OFFICE WILL CALL YOU REGARDING THE MONITOR   Follow-Up: At [facility], you and your health needs are our priority.  As part of our continuing mission to provide you with  exceptional heart care, we have created designated Provider Care Teams.  These Care Teams include your primary Cardiologist (physician) and Advanced Practice Providers (APPs -  Physician Assistants and Nurse Practitioners) who all work together to provide you with the care you need, when you need it.  Your next appointment:   2 month(s)  The format for your next appointment:   In Person  Provider:   You may see DR [facility]  or one of the following Advanced Practice Providers on your designated Care Team:    Kerin Ransom, PA-C  [place], [place]  Coletta Memos, [place]   Other Instructions   Ambulatory Cardiac Monitoring An ambulatory cardiac monitor is a small recording device that is used to detect abnormal heart rhythms (arrhythmias). Most monitors are connected by wires to flat, sticky disks (electrodes) that are then attached to your chest. You may need to wear a monitor if you have had symptoms such as:  Fast heartbeats (palpitations).  Dizziness.  Fainting or light-headedness.  Unexplained weakness.  Shortness of breath. There are several types of monitors. Some common monitors include:  Holter monitor. This records your heart rhythm continuously, usually for 24-48 hours.  Event (episodic) monitor. This monitor has a symptoms button, and when pushed, it will begin recording. You need to activate this monitor to record when you have a heart-related symptom.  Automatic detection monitor. This monitor will begin recording when it detects an abnormal heartbeat. What are the risks? Generally, these devices are safe to use. However, it is possible that the skin under the electrodes will become irritated.  How to prepare for monitoring Your health care provider will prepare your chest for the electrode placement and show you how to use the monitor.  Do not apply lotions to your chest before monitoring.  Follow directions on how to care for the monitor, and how to return  the monitor when the testing period is complete. How to use your cardiac monitor  Follow directions about how long to wear the monitor, and if you can take the monitor off in order to shower or bathe. ? Do not let the monitor get wet. ? Do not bathe, swim, or use a hot tub while wearing the monitor.  Keep your skin clean. Do not put body lotion or moisturizer on your chest.  Change the electrodes as told by your health care provider, or any time they stop sticking to your skin. You may need to use medical tape to keep them on.  Try to put the electrodes in slightly different places on your chest to help prevent skin irritation. Follow directions from your health care provider about where to place the electrodes.  Make sure the monitor is safely clipped to your clothing or in a location close to your body as recommended by your health care provider.  If your monitor has a symptoms button, press the button to mark an event as soon as you feel a heart-related symptom, such as: ? Dizziness. ? Weakness. ? Light-headedness. ? Palpitations. ? Thumping or pounding in your chest. ? Shortness of breath. ? Unexplained weakness.  Keep a diary of your activities, such as walking, doing chores, and taking medicine. It is very important to note what you were doing when you pushed the button to record your symptoms. This will help your health care provider determine what might be contributing to your symptoms.  Send the recorded information as recommended by your health care provider. It may take some time for your health care provider to process the results.  Change the batteries as told by your health care provider.  Keep electronic devices away from your monitor. These include: ? Tablets. ? MP3 players. ? Cell phones.  While wearing your monitor you should avoid: ? Electric blankets. ? Armed forces operational officer. ? Electric toothbrushes. ? Microwave ovens. ? Magnets. ? Metal detectors. Get help  right away if:  You have chest pain.  You have shortness of breath or extreme difficulty breathing.  You develop a very fast heartbeat that does not get better.  You develop dizziness that does not go away.  You faint or constantly feel like you are about to faint. Summary  An ambulatory cardiac monitor is a small recording device that is used to detect abnormal heart rhythms (arrhythmias).  Make sure you understand how to send the information from the monitor to your health care provider.  It is important to press the button on the monitor when you have any heart-related symptoms.  Keep a diary of your activities, such as walking, doing chores, and taking medicine. It is very important to note what you were doing when you pushed the button to record your symptoms. This will help your health care provider learn what might be causing your symptoms. This information is not intended to replace advice given to you by your health care provider. Make sure you discuss any questions you have with your health care provider. Document Revised: 08/31/2017 Document Reviewed: 09/02/2016 Elsevier Patient Education  [place].  Echocardiogram An echocardiogram is a procedure that uses painless sound waves (  ultrasound) to produce an image of the heart. Images from an echocardiogram can provide important information about:  Signs of coronary artery disease (CAD).  Aneurysm detection. An aneurysm is a weak or damaged part of an artery wall that bulges out from the normal force of blood pumping through the body.  Heart size and shape. Changes in the size or shape of the heart can be associated with certain conditions, including heart failure, aneurysm, and CAD.  Heart muscle function.  Heart valve function.  Signs of a past heart attack.  Fluid buildup around the heart.  Thickening of the heart muscle.  A tumor or infectious growth around the heart valves. Tell a health care provider  about:  Any allergies you have.  All medicines you are taking, including vitamins, herbs, eye drops, creams, and over-the-counter medicines.  Any blood disorders you have.  Any surgeries you have had.  Any medical conditions you have.  Whether you are pregnant or may be pregnant. What are the risks? Generally, this is a safe procedure. However, problems may occur, including:  Allergic reaction to dye (contrast) that may be used during the procedure. What happens before the procedure? No specific preparation is needed. You may eat and drink normally. What happens during the procedure?   An IV tube may be inserted into one of your veins.  You may receive contrast through this tube. A contrast is an injection that improves the quality of the pictures from your heart.  A gel will be applied to your chest.  A wand-like tool (transducer) will be moved over your chest. The gel will help to transmit the sound waves from the transducer.  The sound waves will harmlessly bounce off of your heart to allow the heart images to be captured in real-time motion. The images will be recorded on a computer. The procedure may vary among health care providers and hospitals. What happens after the procedure?  You may return to your normal, everyday life, including diet, activities, and medicines, unless your health care provider tells you not to do that. Summary  An echocardiogram is a procedure that uses painless sound waves (ultrasound) to produce an image of the heart.  Images from an echocardiogram can provide important information about the size and shape of your heart, heart muscle function, heart valve function, and fluid buildup around your heart.  You do not need to do anything to prepare before this procedure. You may eat and drink normally.  After the echocardiogram is completed, you may return to your normal, everyday life, unless your health care provider tells you not to do  that. This information is not intended to replace advice given to you by your health care provider. Make sure you discuss any questions you have with your health care provider. Document Revised: 01/09/2019 Document Reviewed: 10/21/2016 Elsevier Patient Education  [place].

## 2019-11-14 ENCOUNTER — Ambulatory Visit: Payer: Medicare PPO

## 2019-11-25 LAB — CBC WITH DIFFERENTIAL/PLATELET
Basophils Absolute: 0.1 10*3/uL (ref 0.0–0.2)
Basos: 2 %
EOS (ABSOLUTE): 0.2 10*3/uL (ref 0.0–0.4)
Eos: 5 %
Hematocrit: 38.7 % (ref 34.0–46.6)
Hemoglobin: 13.7 g/dL (ref 11.1–15.9)
Immature Grans (Abs): 0 10*3/uL (ref 0.0–0.1)
Immature Granulocytes: 0 %
Lymphocytes Absolute: 1 10*3/uL (ref 0.7–3.1)
Lymphs: 30 %
MCH: 34.4 pg — ABNORMAL HIGH (ref 26.6–33.0)
MCHC: 35.4 g/dL (ref 31.5–35.7)
MCV: 97 fL (ref 79–97)
Monocytes Absolute: 0.4 10*3/uL (ref 0.1–0.9)
Monocytes: 12 %
Neutrophils Absolute: 1.7 10*3/uL (ref 1.4–7.0)
Neutrophils: 51 %
Platelets: 421 10*3/uL (ref 150–450)
RBC: 3.98 x10E6/uL (ref 3.77–5.28)
RDW: 11.8 % (ref 11.7–15.4)
WBC: 3.2 10*3/uL — ABNORMAL LOW (ref 3.4–10.8)

## 2019-11-25 LAB — LIPID PANEL
Chol/HDL Ratio: 3 ratio (ref 0.0–4.4)
Cholesterol, Total: 225 mg/dL — ABNORMAL HIGH (ref 100–199)
HDL: 76 mg/dL (ref >39)
LDL Chol Calc (NIH): 132 mg/dL — ABNORMAL HIGH (ref 0–99)
Triglycerides: 98 mg/dL (ref 0–149)
VLDL Cholesterol Cal: 17 mg/dL (ref 5–40)

## 2019-11-25 LAB — COMPREHENSIVE METABOLIC PANEL
ALT: 17 IU/L (ref 0–32)
AST: 26 IU/L (ref 0–40)
Albumin/Globulin Ratio: 1.8 (ref 1.2–2.2)
Albumin: 4.4 g/dL (ref 3.8–4.8)
Alkaline Phosphatase: 122 IU/L — ABNORMAL HIGH (ref 39–117)
BUN/Creatinine Ratio: 16 (ref 12–28)
BUN: 13 mg/dL (ref 8–27)
Bilirubin Total: 0.4 mg/dL (ref 0.0–1.2)
CO2: 24 mmol/L (ref 20–29)
Calcium: 9.5 mg/dL (ref 8.7–10.3)
Chloride: 101 mmol/L (ref 96–106)
Creatinine, Ser: 0.81 mg/dL (ref 0.57–1.00)
GFR calc Af Amer: 86 mL/min/{1.73_m2} (ref >59)
GFR calc non Af Amer: 74 mL/min/{1.73_m2} (ref >59)
Globulin, Total: 2.4 g/dL (ref 1.5–4.5)
Glucose: 88 mg/dL (ref 65–99)
Potassium: 4.6 mmol/L (ref 3.5–5.2)
Sodium: 138 mmol/L (ref 134–144)
Total Protein: 6.8 g/dL (ref 6.0–8.5)

## 2019-11-25 LAB — TSH: TSH: 3 u[IU]/mL (ref 0.450–4.500)

## 2019-11-25 LAB — MAGNESIUM: Magnesium: 2 mg/dL (ref 1.6–2.3)

## 2019-11-25 LAB — T4, FREE: Free T4: 1.04 ng/dL (ref 0.82–1.77)

## 2019-11-25 NOTE — Telephone Encounter (Signed)
Pt came into the office and states that she has not received heart monitor.

## 2019-11-26 ENCOUNTER — Other Ambulatory Visit (HOSPITAL_COMMUNITY): Payer: Medicare PPO

## 2019-11-27 ENCOUNTER — Ambulatory Visit (INDEPENDENT_AMBULATORY_CARE_PROVIDER_SITE_OTHER): Payer: Medicare PPO

## 2019-11-27 DIAGNOSIS — R002 Palpitations: Secondary | ICD-10-CM | POA: Diagnosis not present

## 2019-11-27 DIAGNOSIS — I498 Other specified cardiac arrhythmias: Secondary | ICD-10-CM

## 2019-11-28 ENCOUNTER — Telehealth: Payer: Self-pay | Admitting: Cardiovascular Disease

## 2019-11-28 ENCOUNTER — Telehealth: Payer: Self-pay | Admitting: *Deleted

## 2019-11-28 DIAGNOSIS — E7800 Pure hypercholesterolemia, unspecified: Secondary | ICD-10-CM

## 2019-11-28 DIAGNOSIS — I10 Essential (primary) hypertension: Secondary | ICD-10-CM

## 2019-11-28 DIAGNOSIS — Z5181 Encounter for therapeutic drug level monitoring: Secondary | ICD-10-CM

## 2019-11-28 MED ORDER — ROSUVASTATIN CALCIUM 20 MG PO TABS: 20.0000 mg | ORAL_TABLET | Freq: Every day | ORAL | 3 refills | Status: DC

## 2019-11-28 NOTE — Telephone Encounter (Signed)
Spoke with patient. Patient advised to bring an extra adherent strip to her echo appointment in case the heart monitor has to be removed for the procedure. Patient verbalized understanding.

## 2019-11-28 NOTE — Telephone Encounter (Signed)
-----   Message from Skeet Latch, MD sent at 11/25/2019  8:39 AM EST ----- Normal kidney function, liver function and electrolytes. Thyroid function is normal.  Cholesterol is elevated.  10 year risk of heart attack or stroke is 16%.  Recommend starting rosuvastatin 20mg  to lower this risk.  Repeat lipids/CMP in 6-8 weeks.

## 2019-11-28 NOTE — Telephone Encounter (Signed)
New Message    Pt is calling and says she is wearing a monitor and is scheduled for an Echo on Tuesday. She is wondering if this would cause a conflict for the appt     Please call

## 2019-11-28 NOTE — Telephone Encounter (Signed)
Advised patient of lab results. Order placed for repeat labs, Rx sent to [facility]

## 2019-12-02 ENCOUNTER — Other Ambulatory Visit: Payer: Self-pay

## 2019-12-02 ENCOUNTER — Ambulatory Visit (HOSPITAL_BASED_OUTPATIENT_CLINIC_OR_DEPARTMENT_OTHER): Payer: Medicare PPO

## 2019-12-02 DIAGNOSIS — Z8249 Family history of ischemic heart disease and other diseases of the circulatory system: Secondary | ICD-10-CM | POA: Diagnosis not present

## 2019-12-02 DIAGNOSIS — I498 Other specified cardiac arrhythmias: Secondary | ICD-10-CM | POA: Diagnosis present

## 2019-12-02 DIAGNOSIS — C50919 Malignant neoplasm of unspecified site of unspecified female breast: Secondary | ICD-10-CM | POA: Diagnosis not present

## 2019-12-02 DIAGNOSIS — I10 Essential (primary) hypertension: Secondary | ICD-10-CM | POA: Diagnosis not present

## 2019-12-02 DIAGNOSIS — R0989 Other specified symptoms and signs involving the circulatory and respiratory systems: Secondary | ICD-10-CM | POA: Diagnosis not present

## 2019-12-02 DIAGNOSIS — R002 Palpitations: Secondary | ICD-10-CM | POA: Diagnosis not present

## 2019-12-03 ENCOUNTER — Encounter (INDEPENDENT_AMBULATORY_CARE_PROVIDER_SITE_OTHER): Payer: Self-pay

## 2019-12-03 ENCOUNTER — Ambulatory Visit (HOSPITAL_COMMUNITY)
Admission: RE | Admit: 2019-12-03 | Discharge: 2019-12-03 | Disposition: A | Discharge status: Home / Self Care | Payer: Medicare PPO | Source: Ambulatory Visit | Attending: Internal Medicine | Admitting: Internal Medicine

## 2019-12-03 DIAGNOSIS — I498 Other specified cardiac arrhythmias: Secondary | ICD-10-CM | POA: Diagnosis not present

## 2019-12-03 DIAGNOSIS — R002 Palpitations: Secondary | ICD-10-CM | POA: Insufficient documentation

## 2019-12-03 DIAGNOSIS — C50919 Malignant neoplasm of unspecified site of unspecified female breast: Secondary | ICD-10-CM | POA: Insufficient documentation

## 2019-12-03 DIAGNOSIS — I10 Essential (primary) hypertension: Secondary | ICD-10-CM | POA: Insufficient documentation

## 2019-12-03 DIAGNOSIS — R0989 Other specified symptoms and signs involving the circulatory and respiratory systems: Secondary | ICD-10-CM | POA: Diagnosis not present

## 2019-12-03 DIAGNOSIS — Z8249 Family history of ischemic heart disease and other diseases of the circulatory system: Secondary | ICD-10-CM | POA: Insufficient documentation

## 2019-12-24 ENCOUNTER — Ambulatory Visit: Payer: Medicare Other | Admitting: Hematology and Oncology

## 2020-01-13 ENCOUNTER — Ambulatory Visit: Payer: Medicare PPO | Admitting: Cardiovascular Disease

## 2020-01-13 ENCOUNTER — Telehealth: Payer: Self-pay | Admitting: Cardiovascular Disease

## 2020-01-13 NOTE — Telephone Encounter (Signed)
Pt c/o medication issue:  1. Name of Medication: rosuvastatin (CRESTOR) 20 MG tablet  2. How are you currently taking this medication (dosage and times per day)? As directed  3. Are you having a reaction (difficulty breathing--STAT)? no  4. What is your medication issue? Pt reports joint pain all over, especially her feet. Gradual pain over time

## 2020-01-13 NOTE — Telephone Encounter (Signed)
Spoke with patient. Patient reports joint pain all over especially in her feet. She reports she started Crestor on 2/26 and since then the joint pain began and has gradually gotten worse.   Patient is going to come in for her lab work today and then she is going to go on a "statin holiday." Patient will stop her Crestor for 2 weeks and at the end of the 2 weeks she will notify the office on if her symptoms have resolved or not. Patient will resume crestor if the symptoms do not go away. Patient will make an appointment with her PCP for joint pain if she is still having pain that has not improved in a few days.   Will route to MD and primary nurse as an Juluis Rainier.

## 2020-01-14 DIAGNOSIS — I10 Essential (primary) hypertension: Secondary | ICD-10-CM | POA: Diagnosis not present

## 2020-01-14 DIAGNOSIS — E7800 Pure hypercholesterolemia, unspecified: Secondary | ICD-10-CM | POA: Diagnosis not present

## 2020-01-14 DIAGNOSIS — Z5181 Encounter for therapeutic drug level monitoring: Secondary | ICD-10-CM | POA: Diagnosis not present

## 2020-01-14 NOTE — Telephone Encounter (Signed)
OK thank you 

## 2020-01-15 LAB — LIPID PANEL
Chol/HDL Ratio: 2.3 ratio (ref 0.0–4.4)
Cholesterol, Total: 174 mg/dL (ref 100–199)
HDL: 75 mg/dL (ref >39)
LDL Chol Calc (NIH): 72 mg/dL (ref 0–99)
Triglycerides: 160 mg/dL — ABNORMAL HIGH (ref 0–149)
VLDL Cholesterol Cal: 27 mg/dL (ref 5–40)

## 2020-01-15 LAB — COMPREHENSIVE METABOLIC PANEL
ALT: 29 IU/L (ref 0–32)
AST: 39 IU/L (ref 0–40)
Albumin/Globulin Ratio: 2 (ref 1.2–2.2)
Albumin: 4.7 g/dL (ref 3.8–4.8)
Alkaline Phosphatase: 126 IU/L — ABNORMAL HIGH (ref 39–117)
BUN/Creatinine Ratio: 9 — ABNORMAL LOW (ref 12–28)
BUN: 7 mg/dL — ABNORMAL LOW (ref 8–27)
Bilirubin Total: 0.4 mg/dL (ref 0.0–1.2)
CO2: 23 mmol/L (ref 20–29)
Calcium: 9.6 mg/dL (ref 8.7–10.3)
Chloride: 105 mmol/L (ref 96–106)
Creatinine, Ser: 0.81 mg/dL (ref 0.57–1.00)
GFR calc Af Amer: 86 mL/min/{1.73_m2} (ref >59)
GFR calc non Af Amer: 74 mL/min/{1.73_m2} (ref >59)
Globulin, Total: 2.3 g/dL (ref 1.5–4.5)
Glucose: 100 mg/dL — ABNORMAL HIGH (ref 65–99)
Potassium: 4.4 mmol/L (ref 3.5–5.2)
Sodium: 141 mmol/L (ref 134–144)
Total Protein: 7 g/dL (ref 6.0–8.5)

## 2020-01-23 ENCOUNTER — Encounter: Payer: Self-pay | Admitting: Cardiovascular Disease

## 2020-01-23 ENCOUNTER — Ambulatory Visit: Payer: Medicare PPO | Admitting: Cardiovascular Disease

## 2020-01-23 ENCOUNTER — Other Ambulatory Visit: Payer: Self-pay

## 2020-01-23 VITALS — BP 188/96 | HR 69 | Ht 62.0 in | Wt 121.6 lb

## 2020-01-23 DIAGNOSIS — E7800 Pure hypercholesterolemia, unspecified: Secondary | ICD-10-CM | POA: Diagnosis not present

## 2020-01-23 DIAGNOSIS — I10 Essential (primary) hypertension: Secondary | ICD-10-CM

## 2020-01-23 DIAGNOSIS — R0989 Other specified symptoms and signs involving the circulatory and respiratory systems: Secondary | ICD-10-CM

## 2020-01-23 DIAGNOSIS — Z8249 Family history of ischemic heart disease and other diseases of the circulatory system: Secondary | ICD-10-CM | POA: Diagnosis not present

## 2020-01-23 DIAGNOSIS — Z5181 Encounter for therapeutic drug level monitoring: Secondary | ICD-10-CM

## 2020-01-23 MED ORDER — METOPROLOL SUCCINATE ER 25 MG PO TB24: 25.0000 mg | ORAL_TABLET | Freq: Every day | ORAL | 1 refills | Status: DC

## 2020-01-23 MED ORDER — HYDROCHLOROTHIAZIDE 12.5 MG PO TABS: 12.5000 mg | ORAL_TABLET | Freq: Every day | ORAL | 1 refills | Status: DC

## 2020-01-23 NOTE — Patient Instructions (Signed)
Medication Instructions:  START METOPROLOL SUCC 25 MG DAILY   START HYDROCHLORITHIAZIDE 12.5 MG DAILY   DECREASE YOUR ROSUVASTATIN TO 10 MG EVERY OTHER DAY CALL IF YOU HAVE ANY ISSUES WITH THIS DOSE   *If you need a refill on your cardiac medications before your next appointment, please call your pharmacy*  Lab Work: BMET IN 1 WEEK   If you have labs (blood work) drawn today and your tests are completely normal, you will receive your results only by: Marland Kitchen MyChart Message (if you have MyChart) OR . A paper copy in the mail If you have any lab test that is abnormal or we need to change your treatment, we will call you to review the results.  Testing/Procedures: NONE   Follow-Up: At [facility], you and your health needs are our priority.  As part of our continuing mission to provide you with exceptional heart care, we have created designated Provider Care Teams.  These Care Teams include your primary Cardiologist (physician) and Advanced Practice Providers (APPs -  Physician Assistants and Nurse Practitioners) who all work together to provide you with the care you need, when you need it.  We recommend signing up for the patient portal called "MyChart".  Sign up information is provided on this After Visit Summary.  MyChart is used to connect with patients for Virtual Visits (Telemedicine).  Patients are able to view lab/test results, encounter notes, upcoming appointments, etc.  Non-urgent messages can be sent to your provider as well.   To learn more about what you can do with MyChart, go to [email or web address].    Your next appointment:   3 month(s)  The format for your next appointment:   In Person  Provider:   You may see DR [facility]  or one of the following Advanced Practice Providers on your designated Care Team:    Kerin Ransom, PA-C  [place], [place]  Coletta Memos, [place]  Your physician recommends that you schedule a follow-up appointment in: [place]

## 2020-01-23 NOTE — Progress Notes (Signed)
Cardiology Office Note   Date:  01/23/2020   ID:  Erminia, Nasuti Jun 04, 1951, MRN [id number]  PCP:  [name], MD  Cardiologist:   Skeet Latch, MD   No chief complaint on file.  History of Present Illness: Angela Buchanan is a 69 y.o. female with Celiac disease, breast cancer s/p lumpectomy and XRT, R subclavian DVT,  who is being seen today for the evaluation of palpitations and CV risk assessmetn at the request of [name], MD.  Angela Buchanan reports that she feels her heart fluttering randomly.  It lasts for several minutes at a time.  There is no associated chest or shortness of breath.  However it does make her lightheaded at times.  She also has episodes of lightheadness that are not associated with the palpitations.  She gets plenty of exercise by walking regularly.  She has no exertional chest pain or shortness of breath.  In time she has palpitations with exertion.  She denies lower extremity edema, orthopnea, or PND.  She has been under a lot of stress lately.  Her husband is sick with cancer currently getting treatment.  She notes that her blood pressure has been high lately.  She thinks this is because of the stress.  She has several family members who have had strokes.  She is very fearful of this happening to her as well  In 2017 Angela Buchanan saw Dr. Irish Lack for palpitations.  She wore an ambulatory monitor that revealed sinus rhythm with frequent PVCs.  She also had ventricular bigeminy and trigeminy.  She had a Lexiscan Myoview in 2015 that was negative for ischemia.  It was not gated 2/2 PVCs.   At her last appointment lisinopril was increased to 40mg .  Lipids were elevated so rosuvastatin was started 11/2019.  She developed severe muscle discomfort.  It felt like something was pressing on her all over.  She stopped taking it and has been feeling better.  Her blood pressure continues to run high at home.  It averages in the 160s.  She notes that she has been taking  pseudoephedrine with her allergy medicine.  She is also been eating out now that Covid restrictions are lifting.  She continues to walk her dog daily and has no exertional chest pain or shortness of breath.  She has no lower extremity IMA, orthopnea, or PND.  She wore a 30-day monitor that revealed some PACs and PVCs but was otherwise unremarkable.  She had an echo 12/2019 that revealed normal EF and grade 1 diastolic dysfunction.  Carotid Dopplers were normal.   Past Medical History:  Diagnosis Date  . Bigeminy    no current med.  . Breast cancer ([place]) 06/2017   right  . Bruises easily   . Carotid bruit 11/13/2019  . Dental crowns present   . History of diverticulitis   . Hypertension    states under control with med., has been on med. x 5 yr.  . Personal history of radiation therapy    2018  . Pure hypercholesterolemia 11/13/2019  . 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 Seasonal allergies   . 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    Past Surgical History:  Procedure Laterality Date  . ABDOMINAL HYSTERECTOMY     partial  . BREAST EXCISIONAL BIOPSY Right 06/22/2017   Malignant  . BREAST LUMPECTOMY Right 06/22/2017   Malignant  . BREAST LUMPECTOMY WITH RADIOACTIVE SEED AND SENTINEL LYMPH NODE BIOPSY Right 06/22/2017   Procedure:  RIGHT BREAST LUMPECTOMY WITH RADIOACTIVE SEED AND RIGHT AXILLARY DEEP SENTINEL LYMPH NODE BIOPSY WITH BLUE DYE INJECTION;  Surgeon: Fanny Skates, MD;  Location: [facility];  Service: General;  Laterality: Right;  . BREAST LUMPECTOMY WITH RADIOACTIVE SEED LOCALIZATION Left 06/22/2017   Procedure: LEFT BREAST LUMPECTOMY WITH RADIOACTIVE SEED LOCALIZATION;  Surgeon: Fanny Skates, MD;  Location: [facility];  Service: General;  Laterality: Left;  . CATARACT EXTRACTION W/ INTRAOCULAR LENS  IMPLANT, BILATERAL Bilateral   . EXCISION OF BREAST BIOPSY Left 06/22/2017   benign  . TONSILLECTOMY     age 76     Current Outpatient  Medications  Medication Sig Dispense Refill  . anastrozole (ARIMIDEX) 1 MG tablet Take 1 tablet (1 mg total) by mouth daily. 30 tablet 2  . aspirin EC 81 MG tablet Take 1 tablet (81 mg total) by mouth daily.    Marland Kitchen b complex vitamins tablet Take 1 tablet by mouth daily.    . Calcium Citrate 250 MG TABS Take 250 mg by mouth 4 (four) times daily.    . cholecalciferol (VITAMIN D) 1000 units tablet Take 2,000 Units by mouth daily.    . citalopram (CELEXA) 10 MG tablet Take 10 mg by mouth daily as needed (anxiety).     . fluticasone (FLONASE) 50 MCG/ACT nasal spray     . levocetirizine (XYZAL) 5 MG tablet levocetirizine 5 mg tablet  TAKE 1 TABLET BY MOUTH IN THE EVENING    . levothyroxine (SYNTHROID) 25 MCG tablet levothyroxine 25 mcg tablet  TAKE 1 TABLET BY MOUTH EVERY DAY    . lisinopril (ZESTRIL) 40 MG tablet Take 1 tablet (40 mg total) by mouth daily. 90 tablet 3  . montelukast (SINGULAIR) 10 MG tablet Take 10 mg by mouth at bedtime.     . Multiple Vitamin (MULTIVITAMIN WITH MINERALS) TABS tablet Take 1 tablet by mouth daily.    . rosuvastatin (CRESTOR) 10 MG tablet Take 10 mg by mouth every other day.    . traMADol (ULTRAM) 50 MG tablet Take 0.5-1 tablets (25-50 mg total) by mouth every 6 (six) hours as needed. 30 tablet 0  . hydrochlorothiazide (HYDRODIURIL) 12.5 MG tablet Take 1 tablet (12.5 mg total) by mouth daily. 90 tablet 1  . metoprolol succinate (TOPROL XL) 25 MG 24 hr tablet Take 1 tablet (25 mg total) by mouth daily. 90 tablet 1   No current facility-administered medications for this visit.    Allergies:   Atenolol, Bisoprolol, Prednisone, Amoxicillin-pot clavulanate, and Latex    Social History:  The patient  reports that she has never smoked. She has never used smokeless tobacco. She reports that she does not drink alcohol or use drugs.   Family History:  The patient's family history includes Dementia in her brother and father; Heart attack in her brother; Hypertension in her  sister; Stroke in her brother, maternal grandfather, maternal grandmother, mother, paternal grandfather, paternal grandmother, and sister.    ROS:  Please see the history of present illness.   Otherwise, review of systems are positive for none.   All other systems are reviewed and negative.    PHYSICAL EXAM: VS:  BP (!) 188/96   Pulse 69   Ht 5\' 2"  (1.575 m)   Wt 121 lb 9.6 oz (55.2 kg)   LMP  (LMP Unknown)   SpO2 97%   BMI 22.24 kg/m  , BMI Body mass index is 22.24 kg/m. GENERAL:  Well appearing HEENT: Pupils equal round and  reactive, fundi not visualized, oral mucosa unremarkable NECK:  No jugular venous distention, waveform within normal limits, carotid upstroke brisk and symmetric, no bruits LUNGS:  Clear to auscultation bilaterally HEART:  RRR.  PMI not displaced or sustained,S1 and S2 within normal limits, no S3, no S4, no clicks, no rubs, no murmurs ABD:  Flat, positive bowel sounds normal in frequency in pitch, no bruits, no rebound, no guarding, no midline pulsatile mass, no hepatomegaly, no splenomegaly EXT:  2 plus pulses throughout, no edema, no cyanosis no clubbing SKIN:  No rashes no nodules NEURO:  Cranial nerves II through XII grossly intact, motor grossly intact throughout PSYCH:  Cognitively intact, oriented to person place and time   EKG:  EKG is ordered today. The ekg ordered 11/14/19 demonstrates sinus rhythm.  Rate 62 bpm. Nonspecific ST changes.   Echo 12/02/19:  1. Left ventricular ejection fraction, by estimation, is 55 to 60%. The  left ventricle has normal function. The left ventricle has no regional  wall motion abnormalities. Left ventricular diastolic parameters are  consistent with Grade I diastolic  dysfunction (impaired relaxation). The average left ventricular global  longitudinal strain is -20.4 %.  2. Right ventricular systolic function is normal. The right ventricular  size is normal.  3. The mitral valve is abnormal. Trivial mitral valve  regurgitation.  4. The aortic valve is tricuspid. Aortic valve regurgitation is not  visualized.  5. The inferior vena cava is normal in size with greater than 50%  respiratory variability, suggesting right atrial pressure of 3 mmHg.   Carotid Doppler 12/03/19: Normal  30 Day Event Monitor 01/2020:  Quality: Fair.  Baseline artifact. Predominant rhythm: sinus rhythm Average heart rate: 69 bpm Max heart rate: 131 bpm Min heart rate: 47 bpm Pauses >2.5 seconds: none  Patient did submit a symptom diary.  She reported multiple episodes of chest pain and fluttering.  At which time most of the time sinus rhythm with noted.  She did have occasional PACs or PVCs. 4 beats of atrial tachycardia. Ambulatory monitor 08/17/16:  NSR with PVCs, trigeminy, bigeminy intermittently.  No AFib or pathologic tachyarrhythmias noted.   Lexiscan Myoview 07/30/14:  Scattered PVCs.  No ischemia.  Not gated due to frequent ectopy.  Recent Labs: 11/24/2019: Hemoglobin 13.7; Magnesium 2.0; Platelets 421; TSH 3.000 01/14/2020: ALT 29; BUN 7; Creatinine, Ser 0.81; Potassium 4.4; Sodium 141    Lipid Panel    Component Value Date/Time   CHOL 174 01/14/2020 1535   TRIG 160 (H) 01/14/2020 1535   HDL 75 01/14/2020 1535   CHOLHDL 2.3 01/14/2020 1535   LDLCALC 72 01/14/2020 1535      Wt Readings from Last 3 Encounters:  01/23/20 121 lb 9.6 oz (55.2 kg)  11/13/19 120 lb 12.8 oz (54.8 kg)  05/23/18 115 lb 9.6 oz (52.4 kg)      ASSESSMENT AND PLAN:  # Palpitations:  # PVCs: # Ventricular bigeminy/trigeminy: Angela Buchanan has a history of frequent PVCs that were also noted on her ambulatory monitor.  We will add metoprolol succinate 25 mg daily.  # Bruits:   Carotid bruit noted on exam.   Carotids were normal on Doppler 12/2019.  # Essential hypertension: Blood pressure remains poorly controlled.  Add HCTZ 12.5 and metoprolol succinate 25 mg daily.  Check a BMP in a week.  Continue lisinopril.  #  Hyperlipdemia:   She had myalgias on rosuvastatin.  We will reduce the dose to 10 mg and try taking it every other  day.  Current medicines are reviewed at length with the patient today.  The patient does not have concerns regarding medicines.  The following changes have been made: Add hydrochlorothiazide and metoprolol.  Switch rosuvastatin to 10 mg 3 days weekly.  Labs/ tests ordered today include:   Orders Placed This Encounter  Procedures  . Basic metabolic panel     Disposition:   FU with Partick Musselman C. Oval Linsey, MD, [facility] in 2 months.     Signed, Koby Pickup C. Oval Linsey, MD, [facility]  01/23/2020 5:18 PM    [facility]

## 2020-02-24 DIAGNOSIS — I10 Essential (primary) hypertension: Secondary | ICD-10-CM | POA: Diagnosis not present

## 2020-02-24 DIAGNOSIS — Z5181 Encounter for therapeutic drug level monitoring: Secondary | ICD-10-CM | POA: Diagnosis not present

## 2020-02-25 LAB — BASIC METABOLIC PANEL
BUN/Creatinine Ratio: 7 — ABNORMAL LOW (ref 12–28)
BUN: 6 mg/dL — ABNORMAL LOW (ref 8–27)
CO2: 22 mmol/L (ref 20–29)
Calcium: 9.6 mg/dL (ref 8.7–10.3)
Chloride: 98 mmol/L (ref 96–106)
Creatinine, Ser: 0.81 mg/dL (ref 0.57–1.00)
GFR calc Af Amer: 86 mL/min/{1.73_m2} (ref >59)
GFR calc non Af Amer: 74 mL/min/{1.73_m2} (ref >59)
Glucose: 94 mg/dL (ref 65–99)
Potassium: 4.5 mmol/L (ref 3.5–5.2)
Sodium: 135 mmol/L (ref 134–144)

## 2020-03-02 ENCOUNTER — Other Ambulatory Visit: Payer: Self-pay

## 2020-03-02 ENCOUNTER — Ambulatory Visit (INDEPENDENT_AMBULATORY_CARE_PROVIDER_SITE_OTHER): Payer: Medicare PPO | Admitting: Pharmacist Clinician (PhC)/ Clinical Pharmacy Specialist

## 2020-03-02 DIAGNOSIS — I10 Essential (primary) hypertension: Secondary | ICD-10-CM | POA: Diagnosis not present

## 2020-03-02 NOTE — Patient Instructions (Signed)
Return for a a follow up appointment July 13  Check your blood pressure at home daily and keep record of the readings.  Take your BP meds as follows:  Cut metoprolol dose in half to 12.5 mg daily  Increase hydrochlorothiazide to 2 tablets (25 mg total) daily  Continue with all other medications  If you wish to purchase a home BP cuff, [employer] is a good quality brand, but even the store brands tend to work well.  We prefer arm cuffs over wrist models.  Save your receipt and bring the device to your next appointment so we can verify its accuracy.  Bring all of your meds, your BP cuff and your record of home blood pressures to your next appointment.  Exercise as you're able, try to walk approximately 30 minutes per day.  Keep salt intake to a minimum, especially watch canned and prepared boxed foods.  Eat more fresh fruits and vegetables and fewer canned items.  Avoid eating in fast food restaurants.    HOW TO TAKE YOUR BLOOD PRESSURE: . Rest 5 minutes before taking your blood pressure. .  Don't smoke or drink caffeinated beverages for at least 30 minutes before. . Take your blood pressure before (not after) you eat. . Sit comfortably with your back supported and both feet on the floor (don't cross your legs). . Elevate your arm to heart level on a table or a desk. . Use the proper sized cuff. It should fit smoothly and snugly around your bare upper arm. There should be enough room to slip a fingertip under the cuff. The bottom edge of the cuff should be 1 inch above the crease of the elbow. . Ideally, take 3 measurements at one sitting and record the average.

## 2020-03-02 NOTE — Progress Notes (Signed)
03/04/2020 [name] 1950/10/25 [id number]   HPI:  Angela Buchanan is a 69 y.o. female patient of Dr Oval Linsey, with a [place] below who presents today for hypertension clinic evaluation.  She was seen by Dr. Oval Linsey on April 23 and found to have a BP of 188/96 on lisinopril 40 mg.  Patient noted that her husband has cancer and this has brought about a lot of stress in her life.  She also has a strong family history of stroke and is nervous about this.  She also notes that the rosuvastatin causes her to have pains in her hands and feet.  Continued despite cutting the 10 mg tablets in half, so quit taking altogether.    Today she is feeling well.  She has no complaints of chest pain, shortness of breath or lower extremity edema.  She does not have a home BP cuff, but has checked twice at her nearby [place] store in the past few weeks.     Past Medical History: hyperlipidemia  - 4/21:  TC 174, TG 160, HDL 75, LDL 72 - on rosuvastatin 10 mg (down from LDL 132)  Breast cancer S/p lumpectomy and radiation treatment, now on anti-estrogen tx - anastrozole 1 mg  DVT Found in March 2019 - was on xarelto then Eliquis; repeat scan 11/2018 clear     Blood Pressure Goal:  130/80  Current Medications: lisinopril 40 mg qd, hctz 12.5 mg qd, metoprolol succ 25 mg qd  Family Hx:  Father died at 49 - dementia; mother died at 62 stroke; all 4 grandparents had strokes, brother with MI, ascvd and stroke died at 41, hypertension in sister died at 4 from heart disease; no children  Social Hx: no tobacco, no alcohol; occasional caffeinated soda  Diet: husband on feeding tube so when she cooks, it's just for herself; eating out only once or twice weekly; eats salads mainly in the evenings, adds chicken for protein; notes her appetite decreased with addition of metoprolol   Exercise: walks daily with dog - miniature poodle 3 x per day at least 20 minutes  Home BP readings:  Two readings at [employer] stores 139/77  (02/22/20) and 122/69 (02/29/20)  Intolerances: atenolol, bisoprolol  Labs: 5/21:  Na 135, K 4.5, Glu 94, BUN 6, SCr 0.81, GFR 74  Wt Readings from Last 3 Encounters:  01/23/20 121 lb 9.6 oz (55.2 kg)  11/13/19 120 lb 12.8 oz (54.8 kg)  05/23/18 115 lb 9.6 oz (52.4 kg)   BP Readings from Last 3 Encounters:  03/02/20 (!) 172/90  01/23/20 (!) 188/96  11/13/19 (!) 164/80   Pulse Readings from Last 3 Encounters:  03/02/20 (!) 56  01/23/20 69  11/13/19 62    Current Outpatient Medications  Medication Sig Dispense Refill   anastrozole (ARIMIDEX) 1 MG tablet Take 1 tablet (1 mg total) by mouth daily. 30 tablet 2   aspirin EC 81 MG tablet Take 1 tablet (81 mg total) by mouth daily.     b complex vitamins tablet Take 1 tablet by mouth daily.     Biotin 1000 MCG tablet Take 1,000 mcg by mouth daily.     Calcium-Magnesium-Vitamin D 600-300-400 LIQD Take 1 tablet by mouth daily.     cholecalciferol (VITAMIN D) 1000 units tablet Take 2,000 Units by mouth daily.     citalopram (CELEXA) 10 MG tablet Take 10 mg by mouth daily as needed (anxiety).      fluticasone (FLONASE) 50 MCG/ACT nasal  spray      levocetirizine (XYZAL) 5 MG tablet levocetirizine 5 mg tablet  TAKE 1 TABLET BY MOUTH IN THE EVENING     levothyroxine (SYNTHROID) 25 MCG tablet levothyroxine 25 mcg tablet  TAKE 1 TABLET BY MOUTH EVERY DAY     lisinopril (ZESTRIL) 40 MG tablet Take 1 tablet (40 mg total) by mouth daily. 90 tablet 3   montelukast (SINGULAIR) 10 MG tablet Take 10 mg by mouth at bedtime.      Multiple Vitamin (MULTIVITAMIN WITH MINERALS) TABS tablet Take 1 tablet by mouth daily.     traMADol (ULTRAM) 50 MG tablet Take 0.5-1 tablets (25-50 mg total) by mouth every 6 (six) hours as needed. 30 tablet 0   hydrochlorothiazide (HYDRODIURIL) 25 MG tablet Take 1 tablet (25 mg total) by mouth daily. 90 tablet 3   metoprolol succinate (TOPROL-XL) 25 MG 24 hr tablet Take 0.5 tablets (12.5 mg total) by mouth  daily. 45 tablet 3   No current facility-administered medications for this visit.    Allergies  Allergen Reactions   Atenolol Other (See Comments)    CHEST PAIN   Bisoprolol Other (See Comments)    "FEELS WEIRD"   Prednisone Other (See Comments)    UNABLE TO FOCUS   Amoxicillin-Pot Clavulanate Diarrhea and Other (See Comments)   Latex Rash    Past Medical History:  Diagnosis Date   Bigeminy    no current med.   Breast cancer ([place]) 06/2017   right   Bruises easily    Carotid bruit 11/13/2019   Dental crowns present    History of diverticulitis    Hypertension    states under control with med., has been on med. x 5 yr.   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  Essential hypertension Patient with systolic/diastolic hypertension currently not well controlled.  She is having some side effect issues with the metoprolol, so will cut that dose in half to 12.5 mg daily.  Will increase the hydrochlorothiazide to 25 mg and have her continue with lisinopril 40 mg daily.  We discussed the option of her purchasing a home BP monitor and discussed different brands and prices.  Encouraged her to save receipt until we can validate any home cuff she purchases.  Also offered that if cost were an issue, we would be happy to supply her with a home monitor.  Explained the importance of home BP readings as a more accurate way of monitoring her need for medication adjustments.  We will see her back in one month for follow up.     Tommy Medal PharmD CPP [facility] [address] [place], [place] [address] [phone]

## 2020-03-04 MED ORDER — HYDROCHLOROTHIAZIDE 25 MG PO TABS
25.0000 mg | ORAL_TABLET | Freq: Every day | ORAL | 3 refills | Status: DC
Start: 2020-03-04 — End: 2020-05-11

## 2020-03-04 MED ORDER — METOPROLOL SUCCINATE ER 25 MG PO TB24: 12.5000 mg | ORAL_TABLET | Freq: Every day | ORAL | 3 refills | Status: DC

## 2020-03-04 NOTE — Assessment & Plan Note (Signed)
Patient with systolic/diastolic hypertension currently not well controlled.  She is having some side effect issues with the metoprolol, so will cut that dose in half to 12.5 mg daily.  Will increase the hydrochlorothiazide to 25 mg and have her continue with lisinopril 40 mg daily.  We discussed the option of her purchasing a home BP monitor and discussed different brands and prices.  Encouraged her to save receipt until we can validate any home cuff she purchases.  Also offered that if cost were an issue, we would be happy to supply her with a home monitor.  Explained the importance of home BP readings as a more accurate way of monitoring her need for medication adjustments.  We will see her back in one month for follow up.

## 2020-04-07 DIAGNOSIS — J0100 Acute maxillary sinusitis, unspecified: Secondary | ICD-10-CM | POA: Diagnosis not present

## 2020-04-13 ENCOUNTER — Ambulatory Visit: Payer: Medicare PPO

## 2020-04-29 DIAGNOSIS — E041 Nontoxic single thyroid nodule: Secondary | ICD-10-CM | POA: Diagnosis not present

## 2020-04-29 DIAGNOSIS — Z1211 Encounter for screening for malignant neoplasm of colon: Secondary | ICD-10-CM | POA: Diagnosis not present

## 2020-04-29 DIAGNOSIS — I10 Essential (primary) hypertension: Secondary | ICD-10-CM | POA: Diagnosis not present

## 2020-04-29 DIAGNOSIS — M810 Age-related osteoporosis without current pathological fracture: Secondary | ICD-10-CM | POA: Diagnosis not present

## 2020-04-29 DIAGNOSIS — Z0000 Encounter for general adult medical examination without abnormal findings: Secondary | ICD-10-CM | POA: Diagnosis not present

## 2020-04-29 DIAGNOSIS — E785 Hyperlipidemia, unspecified: Secondary | ICD-10-CM | POA: Diagnosis not present

## 2020-05-03 ENCOUNTER — Other Ambulatory Visit: Payer: Self-pay | Admitting: Adult Health

## 2020-05-03 DIAGNOSIS — Z853 Personal history of malignant neoplasm of breast: Secondary | ICD-10-CM

## 2020-05-05 ENCOUNTER — Other Ambulatory Visit: Payer: Self-pay

## 2020-05-05 ENCOUNTER — Ambulatory Visit
Admission: RE | Admit: 2020-05-05 | Discharge: 2020-05-05 | Disposition: A | Discharge status: Home / Self Care | Payer: Medicare PPO | Source: Ambulatory Visit | Attending: Family Medicine | Admitting: Family Medicine

## 2020-05-05 ENCOUNTER — Ambulatory Visit: Payer: Medicare PPO | Admitting: Cardiovascular Disease

## 2020-05-05 DIAGNOSIS — Z853 Personal history of malignant neoplasm of breast: Secondary | ICD-10-CM | POA: Diagnosis not present

## 2020-05-05 DIAGNOSIS — R922 Inconclusive mammogram: Secondary | ICD-10-CM | POA: Diagnosis not present

## 2020-05-05 NOTE — Progress Notes (Signed)
Patient Care Team: [name], MD as PCP - General (Family Medicine) Fanny Skates, MD as Consulting Physician (General Surgery) Nicholas Lose, MD as Consulting Physician (Hematology and Oncology) Kyung Rudd, MD as Consulting Physician (Radiation Oncology) Gardenia Phlegm, NP as Nurse Practitioner (Hematology and Oncology)  DIAGNOSIS:    ICD-10-CM   1. Malignant neoplasm of upper-outer quadrant of right breast in female, estrogen receptor positive ([place])  C50.411    Z17.0     SUMMARY OF ONCOLOGIC HISTORY: Oncology History  Malignant neoplasm of upper-outer quadrant of right breast in female, estrogen receptor positive ([place])  05/16/2017 Initial Diagnosis   Left breast biopsy UIQ: Complex sclerosing lesion with calcifications and sclerosing adenosis, right breast biopsy 10:00: IDC grade 2, DCIS, ER/PR positive, HER-2 negative ratio 1.26,  11 mm lesion in the right breast at 10:00 position: T1b N0 stage IA clinical stage    06/22/2017 Surgery   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 cm, 1/3 micrometastatic disease in lymph nodes ; Left lumpectomy: Complex sclerosing lesion; grade 2, ER +, PR 50%, HER-2 negative ratio 1.26, Ki-67 15%, T1 cN1 mic M0 stage IA AJCC 8    08/07/2017 Oncotype testing   Oncotype DX score 27: Intermediate risk, 10 year risk of distant recurrence with tamoxifen alone 12% and chemotherapy plus tamoxifen 8%     08/09/2017 - 09/27/2017 Radiation Therapy   Adjuvant radiation therapy   10/2017 -  Anti-estrogen oral therapy   Letrozole daily, couldn't tolerate due to arthralgias, changed to Anastrozole in 11/2017     CHIEF COMPLIANT: Follow-up of DVT and breast cancer  INTERVAL HISTORY: Angela MCVICAR is a 69 y.o. with above-mentioned history of right breast cancer who developed a DVT of the right upper extremity and is currently on '81mg'$  aspirin daily. Mammogram on 05/06/19 showed no evidence of malignancy bilaterally. She presents to the clinic today for  follow-up.     ALLERGIES:  is allergic to atenolol, bisoprolol, prednisone, amoxicillin-pot clavulanate, and latex.  MEDICATIONS:  Current Outpatient Medications  Medication Sig Dispense Refill  . anastrozole (ARIMIDEX) 1 MG tablet Take 1 tablet (1 mg total) by mouth daily. 30 tablet 2  . aspirin EC 81 MG tablet Take 1 tablet (81 mg total) by mouth daily.    Marland Kitchen b complex vitamins tablet Take 1 tablet by mouth daily.    . Biotin 1000 MCG tablet Take 1,000 mcg by mouth daily.    . Calcium-Magnesium-Vitamin D [phone] LIQD Take 1 tablet by mouth daily.    . cholecalciferol (VITAMIN D) 1000 units tablet Take 2,000 Units by mouth daily.    . citalopram (CELEXA) 10 MG tablet Take 10 mg by mouth daily as needed (anxiety).     . fluticasone (FLONASE) 50 MCG/ACT nasal spray     . hydrochlorothiazide (HYDRODIURIL) 25 MG tablet Take 1 tablet (25 mg total) by mouth daily. 90 tablet 3  . levocetirizine (XYZAL) 5 MG tablet levocetirizine 5 mg tablet  TAKE 1 TABLET BY MOUTH IN THE EVENING    . levothyroxine (SYNTHROID) 25 MCG tablet levothyroxine 25 mcg tablet  TAKE 1 TABLET BY MOUTH EVERY DAY    . lisinopril (ZESTRIL) 40 MG tablet Take 1 tablet (40 mg total) by mouth daily. 90 tablet 3  . metoprolol succinate (TOPROL-XL) 25 MG 24 hr tablet Take 0.5 tablets (12.5 mg total) by mouth daily. 45 tablet 3  . montelukast (SINGULAIR) 10 MG tablet Take 10 mg by mouth at bedtime.     . Multiple  Vitamin (MULTIVITAMIN WITH MINERALS) TABS tablet Take 1 tablet by mouth daily.    . traMADol (ULTRAM) 50 MG tablet Take 0.5-1 tablets (25-50 mg total) by mouth every 6 (six) hours as needed. 30 tablet 0   No current facility-administered medications for this visit.    PHYSICAL EXAMINATION: ECOG PERFORMANCE STATUS: 1 - Symptomatic but completely ambulatory  Vitals:   05/06/20 1403  BP: 119/71  Pulse: 64  Resp: 18  Temp: 98.9 F (37.2 C)  SpO2: 100%   Filed Weights   05/06/20 1403  Weight: 112 lb 1.6 oz  (50.8 kg)    BREAST: No palpable masses or nodules in either right or left breasts. No palpable axillary supraclavicular or infraclavicular adenopathy no breast tenderness or nipple discharge. (exam performed in the presence of a chaperone)  LABORATORY DATA:  I have reviewed the data as listed CMP Latest Ref Rng & Units 02/24/2020 01/14/2020 11/24/2019  Glucose 65 - 99 mg/dL 94 100(H) 88  BUN 8 - 27 mg/dL 6(L) 7(L) 13  Creatinine 0.57 - 1.00 mg/dL 0.81 0.81 0.81  Sodium 134 - 144 mmol/L 135 141 138  Potassium 3.5 - 5.2 mmol/L 4.5 4.4 4.6  Chloride 96 - 106 mmol/L 98 105 101  CO2 20 - 29 mmol/L '22 23 24  '$ Calcium 8.7 - 10.3 mg/dL 9.6 9.6 9.5  Total Protein 6.0 - 8.5 g/dL - 7.0 6.8  Total Bilirubin 0.0 - 1.2 mg/dL - 0.4 0.4  Alkaline Phos 39 - 117 IU/L - 126(H) 122(H)  AST 0 - 40 IU/L - 39 26  ALT 0 - 32 IU/L - 29 17    Lab Results  Component Value Date   WBC 3.2 (L) 11/24/2019   HGB 13.7 11/24/2019   HCT 38.7 11/24/2019   MCV 97 11/24/2019   PLT 421 11/24/2019   NEUTROABS 1.7 11/24/2019    ASSESSMENT & PLAN:  Malignant neoplasm of upper-outer quadrant of right breast in female, estrogen receptor positive ([place]) 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 cm, 1/3 micrometastatic disease in lymph nodes ; Left lumpectomy: Complex sclerosing lesion; grade 2, ER +, PR 50%, HER-2 negative ratio 1.26, Ki-67 15%, T1 cN1 mic M0 stage IA AJCC 8  Oncotype DX score 27: Intermediate risk, ROR 12% with tamoxifen alone and 8% with tamoxifen with chemo  Adjuvant radiation therapy completed 09/27/2017 Current treatment: Adjuvant letrozole 2.5 mg daily (stopped 12/20/17 due to arthralgias) changed to Anastrozole Stopped anastrozole in 2019 and has not resumed it. She agreed to start back 05/06/2020.  Bone scan 05/16/2018: No evidence of bone metastases ______________________________________________________________________________________  Carotid and RUE doppler notes right subclavian DVT started  Xarelto in 11/2017 1.Mammogram 05/05/2020: No evidence of malignancy 2. Ultrasound right upper extremity 11/18/2018: No evidence of DVT in the upper extremity: Eliquis discontinued  Because of extensive family history of strokes, I recommended that she start taking aspirin 81 mg daily.  Return to clinic in 1 year for follow-up    No orders of the defined types were placed in this encounter.  The patient has a good understanding of the overall plan. she agrees with it. she will call with any problems that may develop before the next visit here.  Total time spent: 20 mins including face to face time and time spent for planning, charting and coordination of care  Nicholas Lose, MD 05/06/2020  I, Cloyde Reams Dorshimer, am acting as scribe for Dr. Nicholas Lose.  I have reviewed the above documentation for accuracy and completeness, and I agree  with the above.

## 2020-05-06 ENCOUNTER — Telehealth: Payer: Self-pay | Admitting: Hematology and Oncology

## 2020-05-06 ENCOUNTER — Other Ambulatory Visit: Payer: Self-pay

## 2020-05-06 ENCOUNTER — Inpatient Hospital Stay: Payer: Medicare PPO | Attending: Hematology and Oncology | Admitting: Hematology and Oncology

## 2020-05-06 DIAGNOSIS — Z86718 Personal history of other venous thrombosis and embolism: Secondary | ICD-10-CM | POA: Diagnosis not present

## 2020-05-06 DIAGNOSIS — Z79811 Long term (current) use of aromatase inhibitors: Secondary | ICD-10-CM | POA: Diagnosis not present

## 2020-05-06 DIAGNOSIS — Z79899 Other long term (current) drug therapy: Secondary | ICD-10-CM | POA: Diagnosis not present

## 2020-05-06 DIAGNOSIS — Z7982 Long term (current) use of aspirin: Secondary | ICD-10-CM | POA: Diagnosis not present

## 2020-05-06 DIAGNOSIS — Z170 Estrogen receptor positive status [ER+]: Secondary | ICD-10-CM | POA: Diagnosis not present

## 2020-05-06 DIAGNOSIS — Z923 Personal history of irradiation: Secondary | ICD-10-CM | POA: Diagnosis not present

## 2020-05-06 DIAGNOSIS — C50411 Malignant neoplasm of upper-outer quadrant of right female breast: Secondary | ICD-10-CM | POA: Diagnosis not present

## 2020-05-06 MED ORDER — ANASTROZOLE 1 MG PO TABS: 1.0000 mg | ORAL_TABLET | Freq: Every day | ORAL | 3 refills | Status: DC

## 2020-05-06 NOTE — Assessment & Plan Note (Signed)
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 cm, 1/3 micrometastatic disease in lymph nodes ; Left lumpectomy: Complex sclerosing lesion; grade 2, ER +, PR 50%, HER-2 negative ratio 1.26, Ki-67 15%, T1 cN1 mic M0 stage IA AJCC 8  Oncotype DX score 27: Intermediate risk, ROR 12% with tamoxifen alone and 8% with tamoxifen with chemo  Adjuvant radiation therapy completed 09/27/2017 Current treatment: Adjuvant letrozole 2.5 mg daily (stopped 12/20/17 due to arthralgias) changed to Anastrozole Stopped anastrozole and resumed anastrozole 12/24/2018   Bone scan 05/16/2018: No evidence of bone metastases ______________________________________________________________________________________  Carotid and RUE doppler notes right subclavian DVT started Xarelto in 11/2017 1.Mammogram 05/05/2020: No evidence of malignancy 2. Ultrasound right upper extremity 11/18/2018: No evidence of DVT in the upper extremity: We will discontinue Eliquis at this time.  Because of extensive family history of strokes, I recommended that she start taking aspirin 81 mg daily.  Return to clinic in 1 year for follow-up

## 2020-05-06 NOTE — Telephone Encounter (Signed)
Scheduled per 8/5 los. Printed avs and calendar for pt.  

## 2020-05-07 DIAGNOSIS — Z1211 Encounter for screening for malignant neoplasm of colon: Secondary | ICD-10-CM | POA: Diagnosis not present

## 2020-05-10 DIAGNOSIS — I10 Essential (primary) hypertension: Secondary | ICD-10-CM | POA: Diagnosis not present

## 2020-05-11 ENCOUNTER — Other Ambulatory Visit: Payer: Self-pay

## 2020-05-11 ENCOUNTER — Ambulatory Visit: Payer: Medicare PPO | Admitting: Cardiovascular Disease

## 2020-05-11 ENCOUNTER — Encounter: Payer: Self-pay | Admitting: Cardiovascular Disease

## 2020-05-11 VITALS — BP 144/72 | HR 59 | Ht 62.0 in | Wt 111.4 lb

## 2020-05-11 DIAGNOSIS — I493 Ventricular premature depolarization: Secondary | ICD-10-CM | POA: Diagnosis not present

## 2020-05-11 DIAGNOSIS — I10 Essential (primary) hypertension: Secondary | ICD-10-CM

## 2020-05-11 DIAGNOSIS — E7800 Pure hypercholesterolemia, unspecified: Secondary | ICD-10-CM | POA: Diagnosis not present

## 2020-05-11 DIAGNOSIS — I491 Atrial premature depolarization: Secondary | ICD-10-CM | POA: Diagnosis not present

## 2020-05-11 HISTORY — DX: Atrial premature depolarization: I49.1

## 2020-05-11 HISTORY — DX: Ventricular premature depolarization: I49.3

## 2020-05-11 MED ORDER — AMLODIPINE BESYLATE 5 MG PO TABS
5.0000 mg | ORAL_TABLET | Freq: Every day | ORAL | 1 refills | Status: DC
Start: 2020-05-11 — End: 2020-12-02

## 2020-05-11 NOTE — Patient Instructions (Signed)
Medication Instructions:  STOP HYDROCHLOROTHIAZIDE   START AMLODIPINE 5 MG DAILY   *If you need a refill on your cardiac medications before your next appointment, please call your pharmacy*  Lab Work: NONE   Testing/Procedures: NONE  Follow-Up: At [facility], you and your health needs are our priority.  As part of our continuing mission to provide you with exceptional heart care, we have created designated Provider Care Teams.  These Care Teams include your primary Cardiologist (physician) and Advanced Practice Providers (APPs -  Physician Assistants and Nurse Practitioners) who all work together to provide you with the care you need, when you need it.  We recommend signing up for the patient portal called "MyChart".  Sign up information is provided on this After Visit Summary.  MyChart is used to connect with patients for Virtual Visits (Telemedicine).  Patients are able to view lab/test results, encounter notes, upcoming appointments, etc.  Non-urgent messages can be sent to your provider as well.   To learn more about what you can do with MyChart, go to [email or web address].    Your next appointment:   2 month(s)  The format for your next appointment:   In Person  Provider:   You may see DR [facility]  or one of the following Advanced Practice Providers on your designated Care Team:    Kerin Ransom, PA-C  Sierra Brooks, [place]  Coletta Memos, [place]

## 2020-05-11 NOTE — Progress Notes (Signed)
Cardiology Office Note   Date:  05/11/2020   ID:  Angela Buchanan, DOB 04-01-1951, MRN [id number]  PCP:  Angela Small, MD  Cardiologist:   Angela Latch, MD   No chief complaint on file.  History of Present Illness: Angela Buchanan is a 69 y.o. female with Celiac disease, breast cancer s/p lumpectomy and XRT, R subclavian DVT here for follow-up.  In 2017 [name] saw Angela Buchanan for palpitations.  She wore an ambulatory monitor that revealed sinus rhythm with frequent PVCs.  She also had ventricular bigeminy and trigeminy.  She had a Lexiscan Myoview in 2015 that was negative for ischemia.  It was not gated 2/2 PVCs. She was initially seen 11/2019 for the evaluation of palpitations and CV risk assessment.  Her blood pressure is elevated so lisinopril was increased to 40mg .  Lipids were elevated so rosuvastatin was started 11/2019.  She developed severe muscle discomfort.  At her last appointment she was asked to reduce the dose and take it every other day.  She wore a 30-day monitor that revealed some PACs and PVCs but was otherwise unremarkable.  She was started on metoprolol.  She had an echo 12/2019 that revealed normal EF and grade 1 diastolic dysfunction.  Carotid Dopplers were normal.  Since starting the metoprolol she has been feeling much better.  She no longer has chest pain or pressure.  She followed up with our pharmacis and her BP was poorly controlled.  HCTZ was increased and metoprolol was reduced.  However she never actually started taking the 25 mg dose.  She had an episode of severe nausea.  She also just wasn't generally feeling well.  She had a wellness visit with her PCP and her sodium was 129 on 7/29.  She was instructed to drink Gatorade, chicken broth and increase her salt.  She had follow up labs and things were reportedly back to normal.  She is starting to feel back to baseline.  She continues to walk her dog for exercise.  She has no chest pain and her breathing is stable.  At  home her BP has ranged from 130-136 mmHg.  She notes that her appetite has been poor and she is losing weight.  She continues to walk her dog daily for several miles and has no exertional chest pain or shortness of breath.   Past Medical History:  Diagnosis Date  . Bigeminy    no current med.  . Breast cancer ([place]) 06/2017   right  . Bruises easily   . Carotid bruit 11/13/2019  . Dental crowns present   . History of diverticulitis   . Hypertension    states under control with med., has been on med. x 5 yr.  Marland Kitchen PAC (premature atrial contraction) 05/11/2020  . Personal history of radiation therapy    2018  . Pure hypercholesterolemia 11/13/2019  . PVC (premature ventricular contraction) 05/11/2020  . 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 Seasonal allergies   . 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    Past Surgical History:  Procedure Laterality Date  . ABDOMINAL HYSTERECTOMY     partial  . BREAST EXCISIONAL BIOPSY Right 06/22/2017   Malignant  . BREAST LUMPECTOMY Right 06/22/2017   Malignant  . BREAST LUMPECTOMY WITH RADIOACTIVE SEED AND SENTINEL LYMPH NODE BIOPSY Right 06/22/2017   Procedure: RIGHT BREAST LUMPECTOMY WITH RADIOACTIVE SEED AND RIGHT AXILLARY DEEP SENTINEL LYMPH NODE BIOPSY WITH BLUE DYE INJECTION;  Surgeon: Fanny Skates, MD;  Location:  [place];  Service: General;  Laterality: Right;  . BREAST LUMPECTOMY WITH RADIOACTIVE SEED LOCALIZATION Left 06/22/2017   Procedure: LEFT BREAST LUMPECTOMY WITH RADIOACTIVE SEED LOCALIZATION;  Surgeon: Fanny Skates, MD;  Location: [place];  Service: General;  Laterality: Left;  . CATARACT EXTRACTION W/ INTRAOCULAR LENS  IMPLANT, BILATERAL Bilateral   . EXCISION OF BREAST BIOPSY Left 06/22/2017   benign  . TONSILLECTOMY     age 11     Current Outpatient Medications  Medication Sig Dispense Refill  . anastrozole (ARIMIDEX) 1 MG tablet Take 1 tablet (1 mg total) by mouth daily. 90 tablet 3   . aspirin EC 81 MG tablet Take 1 tablet (81 mg total) by mouth daily.    Marland Kitchen b complex vitamins tablet Take 1 tablet by mouth daily.    . Biotin 1000 MCG tablet Take 1,000 mcg by mouth daily.    . Calcium-Magnesium-Vitamin D [phone] LIQD Take 1 tablet by mouth daily.    . cholecalciferol (VITAMIN D) 1000 units tablet Take 2,000 Units by mouth daily.    . citalopram (CELEXA) 10 MG tablet Take 10 mg by mouth daily as needed (anxiety).     . fluticasone (FLONASE) 50 MCG/ACT nasal spray     . levocetirizine (XYZAL) 5 MG tablet levocetirizine 5 mg tablet  TAKE 1 TABLET BY MOUTH IN THE EVENING    . levothyroxine (SYNTHROID) 25 MCG tablet levothyroxine 25 mcg tablet  TAKE 1 TABLET BY MOUTH EVERY DAY    . lisinopril (ZESTRIL) 40 MG tablet Take 1 tablet (40 mg total) by mouth daily. 90 tablet 3  . metoprolol succinate (TOPROL-XL) 25 MG 24 hr tablet Take 0.5 tablets (12.5 mg total) by mouth daily. 45 tablet 3  . montelukast (SINGULAIR) 10 MG tablet Take 10 mg by mouth at bedtime.     . Multiple Vitamin (MULTIVITAMIN WITH MINERALS) TABS tablet Take 1 tablet by mouth daily.    . traMADol (ULTRAM) 50 MG tablet Take 0.5-1 tablets (25-50 mg total) by mouth every 6 (six) hours as needed. 30 tablet 0  . amLODipine (NORVASC) 5 MG tablet Take 1 tablet (5 mg total) by mouth daily. 90 tablet 1   No current facility-administered medications for this visit.    Allergies:   Atenolol, Bisoprolol, Prednisone, Amoxicillin-pot clavulanate, Hctz [hydrochlorothiazide], and Latex    Social History:  The patient  reports that she has never smoked. She has never used smokeless tobacco. She reports that she does not drink alcohol and does not use drugs.   Family History:  The patient's family history includes Dementia in her brother and father; Heart attack in her brother; Hypertension in her sister; Stroke in her brother, maternal grandfather, maternal grandmother, mother, paternal grandfather, paternal grandmother,  and sister.    ROS:  Please see the history of present illness.   Otherwise, review of systems are positive for none.   All other systems are reviewed and negative.    PHYSICAL EXAM: VS:  BP (!) 144/72   Pulse (!) 59   Ht 5\' 2"  (1.575 m)   Wt 111 lb 6.4 oz (50.5 kg)   LMP  (LMP Unknown)   SpO2 96%   BMI 20.38 kg/m  , BMI Body mass index is 20.38 kg/m. GENERAL:  Well appearing HEENT: Pupils equal round and reactive, fundi not visualized, oral mucosa unremarkable NECK:  No jugular venous distention, waveform within normal limits, carotid upstroke brisk and symmetric, no bruits, no thyromegaly LYMPHATICS:  No cervical adenopathy LUNGS:  Clear to auscultation bilaterally HEART:  RRR.  PMI not displaced or sustained,S1 and S2 within normal limits, no S3, no S4, no clicks, no rubs, no murmurs ABD:  Flat, positive bowel sounds normal in frequency in pitch, no bruits, no rebound, no guarding, no midline pulsatile mass, no hepatomegaly, no splenomegaly EXT:  2 plus pulses throughout, no edema, no cyanosis no clubbing SKIN:  No rashes no nodules NEURO:  Cranial nerves II through XII grossly intact, motor grossly intact throughout PSYCH:  Cognitively intact, oriented to person place and time  EKG:  EKG is not ordered today. The ekg ordered 11/14/19 demonstrates sinus rhythm.  Rate 62 bpm. Nonspecific ST changes.   Echo 12/02/19:  1. Left ventricular ejection fraction, by estimation, is 55 to 60%. The  left ventricle has normal function. The left ventricle has no regional  wall motion abnormalities. Left ventricular diastolic parameters are  consistent with Grade I diastolic  dysfunction (impaired relaxation). The average left ventricular global  longitudinal strain is -20.4 %.  2. Right ventricular systolic function is normal. The right ventricular  size is normal.  3. The mitral valve is abnormal. Trivial mitral valve regurgitation.  4. The aortic valve is tricuspid. Aortic valve  regurgitation is not  visualized.  5. The inferior vena cava is normal in size with greater than 50%  respiratory variability, suggesting right atrial pressure of 3 mmHg.   Carotid Doppler 12/03/19: Normal  30 Day Event Monitor 01/2020:  Quality: Fair.  Baseline artifact. Predominant rhythm: sinus rhythm Average heart rate: 69 bpm Max heart rate: 131 bpm Min heart rate: 47 bpm Pauses >2.5 seconds: none  Patient did submit a symptom diary.  She reported multiple episodes of chest pain and fluttering.  At which time most of the time sinus rhythm with noted.  She did have occasional PACs or PVCs. 4 beats of atrial tachycardia. Ambulatory monitor 08/17/16:  NSR with PVCs, trigeminy, bigeminy intermittently.  No AFib or pathologic tachyarrhythmias noted.   Lexiscan Myoview 07/30/14:  Scattered PVCs.  No ischemia.  Not gated due to frequent ectopy.  Recent Labs: 11/24/2019: Hemoglobin 13.7; Magnesium 2.0; Platelets 421; TSH 3.000 01/14/2020: ALT 29 02/24/2020: BUN 6; Creatinine, Ser 0.81; Potassium 4.5; Sodium 135    Lipid Panel    Component Value Date/Time   CHOL 174 01/14/2020 1535   TRIG 160 (H) 01/14/2020 1535   HDL 75 01/14/2020 1535   CHOLHDL 2.3 01/14/2020 1535   LDLCALC 72 01/14/2020 1535      Wt Readings from Last 3 Encounters:  05/11/20 111 lb 6.4 oz (50.5 kg)  05/06/20 112 lb 1.6 oz (50.8 kg)  01/23/20 121 lb 9.6 oz (55.2 kg)      ASSESSMENT AND PLAN:  # Palpitations:  # PVCs: # Ventricular bigeminy/trigeminy: Angela Buchanan has a history of frequent PVCs that were also noted on her ambulatory monitor.  Symptoms have been improved on metoprolol.  # Bruits:  Carotid bruit noted on exam.   Carotids were normal on Doppler 12/2019.  # Essential hypertension: Blood pressure is poorly controlled.  She developed hyponatremia on hydrochlorothiazide.  This is proved with high salt intake and increase fluids.  However this is not a good long-term solution.  We will  stop the hydrochlorothiazide.  Start amlodipine 5 mg daily.  Continue lisinopril and metoprolol.  She will track her blood pressures and bring to follow-up.  She had repeat lab work with her PCP this week and was told  that everything was back to normal.  # Hyperlipdemia:   She had myalgias on rosuvastatin.  She is tolerating intermittent dosing of rosuvastatin.  We will plan to check her lipids when she follows up next time.  Current medicines are reviewed at length with the patient today.  The patient does not have concerns regarding medicines.  The following changes have been made: Stop HCTZ.  Start amlodipine  Labs/ tests ordered today include:   No orders of the defined types were placed in this encounter.    Disposition:   FU with Sherrita Riederer C. Oval Linsey, MD, [facility] in 2 months.     Signed, Juno Alers C. Oval Linsey, MD, [facility]  05/11/2020 5:13 PM    [facility]

## 2020-05-18 ENCOUNTER — Encounter (HOSPITAL_COMMUNITY)
Admission: RE | Admit: 2020-05-18 | Discharge: 2020-05-18 | Disposition: A | Discharge status: Home / Self Care | Payer: Medicare PPO | Source: Ambulatory Visit | Attending: Hematology and Oncology | Admitting: Hematology and Oncology

## 2020-05-18 ENCOUNTER — Other Ambulatory Visit: Payer: Self-pay

## 2020-05-18 DIAGNOSIS — M25512 Pain in left shoulder: Secondary | ICD-10-CM | POA: Diagnosis not present

## 2020-05-18 DIAGNOSIS — C50411 Malignant neoplasm of upper-outer quadrant of right female breast: Secondary | ICD-10-CM | POA: Diagnosis not present

## 2020-05-18 DIAGNOSIS — Z853 Personal history of malignant neoplasm of breast: Secondary | ICD-10-CM | POA: Diagnosis not present

## 2020-05-18 DIAGNOSIS — Z170 Estrogen receptor positive status [ER+]: Secondary | ICD-10-CM | POA: Insufficient documentation

## 2020-05-18 MED ORDER — TECHNETIUM TC 99M MEDRONATE IV KIT
21.0000 | PACK | Freq: Once | INTRAVENOUS | Status: AC
  Administered 2020-05-18: 21 via INTRAVENOUS

## 2020-05-19 ENCOUNTER — Ambulatory Visit (HOSPITAL_COMMUNITY)
Admission: RE | Admit: 2020-05-19 | Discharge: 2020-05-19 | Disposition: A | Discharge status: Home / Self Care | Payer: Medicare PPO | Source: Ambulatory Visit | Attending: Hematology and Oncology | Admitting: Hematology and Oncology

## 2020-05-19 ENCOUNTER — Telehealth: Payer: Self-pay | Admitting: Hematology and Oncology

## 2020-05-19 DIAGNOSIS — C50411 Malignant neoplasm of upper-outer quadrant of right female breast: Secondary | ICD-10-CM

## 2020-05-19 DIAGNOSIS — Z170 Estrogen receptor positive status [ER+]: Secondary | ICD-10-CM | POA: Diagnosis not present

## 2020-05-19 DIAGNOSIS — R918 Other nonspecific abnormal finding of lung field: Secondary | ICD-10-CM | POA: Diagnosis not present

## 2020-05-19 DIAGNOSIS — R948 Abnormal results of function studies of other organs and systems: Secondary | ICD-10-CM | POA: Diagnosis not present

## 2020-05-19 NOTE — Telephone Encounter (Signed)
I discussed results of the bone scan. We are getting an x-ray of the chest as well as the hip to evaluate this further. There is no abnormality of the site of her discomfort.

## 2020-05-20 ENCOUNTER — Other Ambulatory Visit: Payer: Self-pay | Admitting: Hematology and Oncology

## 2020-05-20 DIAGNOSIS — Z170 Estrogen receptor positive status [ER+]: Secondary | ICD-10-CM

## 2020-05-20 DIAGNOSIS — C50411 Malignant neoplasm of upper-outer quadrant of right female breast: Secondary | ICD-10-CM

## 2020-05-20 DIAGNOSIS — M899 Disorder of bone, unspecified: Secondary | ICD-10-CM

## 2020-05-20 NOTE — Progress Notes (Signed)
I discussed with her the results of the chest x-ray and the hip x-ray.  The chest x-ray is felt to be benign.  The hip x-ray suggested a subtle lucency in the right greater trochanter.  The recommendations get an MRI with and without contrast.  I ordered the MRI to be done in the next 1 to 2 weeks.  We will call her with the results of the MRI.  If necessary she might need a biopsy.

## 2020-05-21 ENCOUNTER — Other Ambulatory Visit: Payer: Self-pay | Admitting: *Deleted

## 2020-05-21 DIAGNOSIS — Z170 Estrogen receptor positive status [ER+]: Secondary | ICD-10-CM

## 2020-05-21 DIAGNOSIS — C50411 Malignant neoplasm of upper-outer quadrant of right female breast: Secondary | ICD-10-CM

## 2020-05-27 ENCOUNTER — Other Ambulatory Visit: Payer: Self-pay | Admitting: Adult Health

## 2020-05-27 DIAGNOSIS — Z9889 Other specified postprocedural states: Secondary | ICD-10-CM

## 2020-06-03 ENCOUNTER — Other Ambulatory Visit: Payer: Self-pay

## 2020-06-03 ENCOUNTER — Ambulatory Visit
Admission: RE | Admit: 2020-06-03 | Discharge: 2020-06-03 | Disposition: A | Discharge status: Home / Self Care | Payer: Medicare PPO | Source: Ambulatory Visit | Attending: Hematology and Oncology | Admitting: Hematology and Oncology

## 2020-06-03 DIAGNOSIS — M898X5 Other specified disorders of bone, thigh: Secondary | ICD-10-CM | POA: Diagnosis not present

## 2020-06-03 DIAGNOSIS — Z170 Estrogen receptor positive status [ER+]: Secondary | ICD-10-CM

## 2020-06-03 DIAGNOSIS — C50411 Malignant neoplasm of upper-outer quadrant of right female breast: Secondary | ICD-10-CM

## 2020-06-03 MED ORDER — GADOBENATE DIMEGLUMINE 529 MG/ML IV SOLN
9.0000 mL | Freq: Once | INTRAVENOUS | Status: AC | PRN
  Administered 2020-06-03: 9 mL via INTRAVENOUS

## 2020-06-04 ENCOUNTER — Telehealth: Payer: Self-pay | Admitting: Hematology and Oncology

## 2020-06-04 NOTE — Telephone Encounter (Signed)
I informed the patient that the hip MRI did not show any clear-cut evidence of bone metastases.  It is felt to be favoring benign etiology.  The recommendation is to get another x-ray in 3 to 6 months. There is some mild T2 hyperintense marrow enhancement within the superior pubic ramus likely stress mediated and may cause the right hip pain.

## 2020-06-16 ENCOUNTER — Other Ambulatory Visit: Payer: Medicare PPO

## 2020-06-17 DIAGNOSIS — L84 Corns and callosities: Secondary | ICD-10-CM | POA: Diagnosis not present

## 2020-06-17 DIAGNOSIS — H6983 Other specified disorders of Eustachian tube, bilateral: Secondary | ICD-10-CM | POA: Diagnosis not present

## 2020-07-09 ENCOUNTER — Telehealth: Payer: Self-pay | Admitting: Cardiovascular Disease

## 2020-07-09 NOTE — Telephone Encounter (Signed)
Tylenol is the best over the counter option.

## 2020-07-09 NOTE — Telephone Encounter (Signed)
Patient has an earache she is wondering what she can take for the pain she she had high blood pressure.

## 2020-07-09 NOTE — Telephone Encounter (Signed)
Advised patient, verbalized understanding  

## 2020-07-09 NOTE — Telephone Encounter (Signed)
Instructed pt may take Ibuprofen as directed on bottle and maybe alternate with ES Tylenol as directed to see if this would help with ear pain will forward to Dr Oval Linsey for review and recommendations ./cy

## 2020-07-09 NOTE — Telephone Encounter (Signed)
Went to PCP and was given Amoxil, two rounds and still having pain She will use Tylenol and Ibuprofen as needed and try to get in to see ENT

## 2020-07-13 DIAGNOSIS — Z23 Encounter for immunization: Secondary | ICD-10-CM | POA: Diagnosis not present

## 2020-07-16 DIAGNOSIS — M26629 Arthralgia of temporomandibular joint, unspecified side: Secondary | ICD-10-CM | POA: Insufficient documentation

## 2020-07-16 DIAGNOSIS — H9203 Otalgia, bilateral: Secondary | ICD-10-CM | POA: Diagnosis not present

## 2020-07-16 DIAGNOSIS — M26623 Arthralgia of bilateral temporomandibular joint: Secondary | ICD-10-CM | POA: Diagnosis not present

## 2020-07-22 ENCOUNTER — Ambulatory Visit: Payer: Medicare PPO | Admitting: Cardiovascular Disease

## 2020-09-21 ENCOUNTER — Ambulatory Visit: Payer: Medicare PPO | Admitting: Cardiovascular Disease

## 2020-09-21 ENCOUNTER — Other Ambulatory Visit: Payer: Self-pay

## 2020-09-21 ENCOUNTER — Encounter: Payer: Self-pay | Admitting: Cardiovascular Disease

## 2020-09-21 VITALS — BP 142/82 | HR 64 | Ht 62.0 in | Wt 113.6 lb

## 2020-09-21 DIAGNOSIS — R079 Chest pain, unspecified: Secondary | ICD-10-CM | POA: Diagnosis not present

## 2020-09-21 DIAGNOSIS — R0782 Intercostal pain: Secondary | ICD-10-CM | POA: Diagnosis not present

## 2020-09-21 DIAGNOSIS — E871 Hypo-osmolality and hyponatremia: Secondary | ICD-10-CM | POA: Diagnosis not present

## 2020-09-21 DIAGNOSIS — I491 Atrial premature depolarization: Secondary | ICD-10-CM

## 2020-09-21 DIAGNOSIS — I493 Ventricular premature depolarization: Secondary | ICD-10-CM | POA: Diagnosis not present

## 2020-09-21 DIAGNOSIS — Z5181 Encounter for therapeutic drug level monitoring: Secondary | ICD-10-CM

## 2020-09-21 DIAGNOSIS — I10 Essential (primary) hypertension: Secondary | ICD-10-CM

## 2020-09-21 MED ORDER — DICLOFENAC SODIUM 1 % EX GEL: 2.0000 g | Freq: Four times a day (QID) | CUTANEOUS | 1 refills | Status: DC

## 2020-09-21 MED ORDER — DICLOFENAC SODIUM 1 % EX GEL: 2.0000 g | Freq: Four times a day (QID) | CUTANEOUS | 1 refills | Status: DC | PRN

## 2020-09-21 MED ORDER — METOPROLOL SUCCINATE ER 25 MG PO TB24: 25.0000 mg | ORAL_TABLET | Freq: Every day | ORAL | 3 refills | Status: DC

## 2020-09-21 MED ORDER — NITROGLYCERIN 0.4 MG SL SUBL
0.4000 mg | SUBLINGUAL_TABLET | SUBLINGUAL | 99 refills | Status: DC | PRN
Start: 2020-09-21 — End: 2022-05-17

## 2020-09-21 NOTE — Patient Instructions (Addendum)
Medication Instructions:  INCREASE YOUR METOPROLOL TO FULL TABLET   Use your NTG under your tongue for recurrent chest pain. May take one tablet every 5 minutes. If you are still having discomfort after 3 tablets in 15 minutes, call 911.   TRY VOLTAREN GEL 4 TIMES A DAY AS NEEDED FOR PAIN   *If you need a refill on your cardiac medications before your next appointment, please call your pharmacy*  Lab Work: BMET SOON    Testing/Procedures: Your physician has requested that you have a lexiscan myoview. For further information please visit [email or web address]. Please follow instruction sheet, as given.  Follow-Up: At [facility], you and your health needs are our priority.  As part of our continuing mission to provide you with exceptional heart care, we have created designated Provider Care Teams.  These Care Teams include your primary Cardiologist (physician) and Advanced Practice Providers (APPs -  Physician Assistants and Nurse Practitioners) who all work together to provide you with the care you need, when you need it.  We recommend signing up for the patient portal called "MyChart".  Sign up information is provided on this After Visit Summary.  MyChart is used to connect with patients for Virtual Visits (Telemedicine).  Patients are able to view lab/test results, encounter notes, upcoming appointments, etc.  Non-urgent messages can be sent to your provider as well.   To learn more about what you can do with MyChart, go to [email or web address].    Your next appointment:   2 month(s)  The format for your next appointment:   In Person  Provider:   You may see DR [facility]  or one of the following Advanced Practice Providers on your designated Care Team:    Kerin Ransom, PA-C  [place], [place]  Coletta Memos, [place]  Other Instructions   GO TO OR CALL DR GRAMIG'S OFFICE FOR AN APPOINTMENT [phone]  MONITOR YOUR BLOOD PRESSURE TWICE A DAY, LOG AND BRING TO YOUR FOLLOW  UP. BRING YOUR BLOOD PRESSURE MACHINE AS WELL.

## 2020-09-21 NOTE — Progress Notes (Signed)
Cardiology Office Note   Date:  09/21/2020   ID:  Angela Buchanan, DOB 15-Oct-1950, MRN [id number]  PCP:  [name], MD  Cardiologist:   Skeet Latch, MD   No chief complaint on file.  History of Present Illness: Angela Buchanan is a 69 y.o. female with Celiac disease, breast cancer s/p lumpectomy and XRT, R subclavian DVT here for follow-up.  In 2017 Angela Buchanan saw Dr. Irish Lack for palpitations.  She wore an ambulatory monitor that revealed sinus rhythm with frequent PVCs.  She also had ventricular bigeminy and trigeminy.  She had a Lexiscan Myoview in 2015 that was negative for ischemia.  It was not gated 2/2 PVCs. She was initially seen 11/2019 for the evaluation of palpitations and CV risk assessment.  Her blood pressure is elevated so lisinopril was increased to 40mg .  Lipids were elevated so rosuvastatin was started 11/2019.  She developed severe muscle discomfort.  At herlastappointment Angela Buchanan she was asked to reduce the dose and take it every other day.  She wore a 30-day monitor that revealed some PACs and PVCs but was otherwise unremarkable.  She was started on metoprolol.  She had an echo 12/2019 that revealed normal EF and grade 1 diastolic dysfunction.  Carotid Dopplers were normal.  Since starting the metoprolol she has been feeling much better.  She no longer has chest pain or pressure.  She followed up with our pharmacist and her BP was poorly controlled.  HCTZ was increased and metoprolol was reduced.  However she never actually started taking the 25 mg dose.  She had an episode of severe nausea.  She also just wasn't generally feeling well.  She had a wellness visit with her PCP and her sodium was 129 on 7/29.  She was instructed to drink Gatorade, chicken broth and increase her salt.  She had follow up labs and things were reportedly back to normal.  She is starting to feel back to baseline.  She continues to walk her dog for exercise.  She has no chest pain and her breathing is  stable.  At home her BP has ranged from 130-136 mmHg.  She notes that her appetite has been poor and she is losing weight.  She continues to walk her dog daily for several miles and has no exertional chest pain or shortness of breath.  At her last appointment Angela Buchanan had hyponatremia and poorly controlled blood pressure. Hydrochlorothiazide was discontinued and she was started on amlodipine. At home her BP has been fluctuating.  It ranges from 100/72 to 174/84.  She usually checks her BP in the afternoon.  She has been under a lot of stress lately.  Her husband has stage IV cancer.  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  Sometimes the pain is so excruciating she wants to pull her fingers off.  Saturday night she had an episode of severe chest pressure.  She was cleaning her kitchen and had substernal chest discomfort.  It was hard for her to take a deep breath.  There is no associated nausea or diaphoresis.  The episode lasted for over an hour.  When she laid down it was better by morning.  She decided not to go to the hospital because she is afraid of Covid and because she did not leave her husband alone.  Since then she has not had any recurrent episodes.  She does walk  her dog a few times every day she has not noted any lower extremity edema, orthopnea, or PND.   Past Medical History:  Diagnosis Date  . Bigeminy    no current med.  . Breast cancer ([place]) 06/2017   right  . Bruises easily   . Carotid bruit 11/13/2019  . Dental crowns present   . History of diverticulitis   . Hypertension    states under control with med., has been on med. x 5 yr.  Marland Kitchen PAC (premature atrial contraction) 05/11/2020  . Personal history of radiation therapy    2018  . Pure hypercholesterolemia 11/13/2019  . PVC (premature ventricular contraction) 05/11/2020  . 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 Seasonal allergies   . Ventricular bigeminy  11/13/2019    Past Surgical History:  Procedure Laterality Date  . ABDOMINAL HYSTERECTOMY     partial  . BREAST EXCISIONAL BIOPSY Right 06/22/2017   Malignant  . BREAST LUMPECTOMY Right 06/22/2017   Malignant  . BREAST LUMPECTOMY WITH RADIOACTIVE SEED AND SENTINEL LYMPH NODE BIOPSY Right 06/22/2017   Procedure: RIGHT BREAST LUMPECTOMY WITH RADIOACTIVE SEED AND RIGHT AXILLARY DEEP SENTINEL LYMPH NODE BIOPSY WITH BLUE DYE INJECTION;  Surgeon: Fanny Skates, MD;  Location: [facility];  Service: General;  Laterality: Right;  . BREAST LUMPECTOMY WITH RADIOACTIVE SEED LOCALIZATION Left 06/22/2017   Procedure: LEFT BREAST LUMPECTOMY WITH RADIOACTIVE SEED LOCALIZATION;  Surgeon: Fanny Skates, MD;  Location: [facility];  Service: General;  Laterality: Left;  . CATARACT EXTRACTION W/ INTRAOCULAR LENS  IMPLANT, BILATERAL Bilateral   . EXCISION OF BREAST BIOPSY Left 06/22/2017   benign  . TONSILLECTOMY     age 87     Current Outpatient Medications  Medication Sig Dispense Refill  . ALPRAZolam (XANAX) 0.25 MG tablet 1 tablet    . amLODipine (NORVASC) 5 MG tablet Take 1 tablet (5 mg total) by mouth daily. 90 tablet 1  . anastrozole (ARIMIDEX) 1 MG tablet Take 1 tablet (1 mg total) by mouth daily. 90 tablet 3  . aspirin EC 81 MG tablet Take 1 tablet (81 mg total) by mouth daily.    Marland Kitchen b complex vitamins tablet Take 1 tablet by mouth daily.    . Biotin 1000 MCG tablet Take 1,000 mcg by mouth daily.    . Calcium-Magnesium-Vitamin D [id number] LIQD Take 1 tablet by mouth daily.    . cholecalciferol (VITAMIN D) 1000 units tablet Take 2,000 Units by mouth daily.    . citalopram (CELEXA) 10 MG tablet Take 10 mg by mouth daily as needed (anxiety).     . fluticasone (FLONASE) 50 MCG/ACT nasal spray     . levocetirizine (XYZAL) 5 MG tablet levocetirizine 5 mg tablet  TAKE 1 TABLET BY MOUTH IN THE EVENING    . levothyroxine (SYNTHROID) 25 MCG tablet levothyroxine 25 mcg  tablet  TAKE 1 TABLET BY MOUTH EVERY DAY    . lisinopril (ZESTRIL) 40 MG tablet Take 1 tablet (40 mg total) by mouth daily. 90 tablet 3  . montelukast (SINGULAIR) 10 MG tablet Take 10 mg by mouth at bedtime.     . Multiple Vitamin (MULTIVITAMIN WITH MINERALS) TABS tablet Take 1 tablet by mouth daily.    . traMADol (ULTRAM) 50 MG tablet Take 0.5-1 tablets (25-50 mg total) by mouth every 6 (six) hours as needed. 30 tablet 0  . diclofenac Sodium (VOLTAREN) 1 % GEL Apply 2 g topically 4 (four) times daily as needed. 50 g  1  . metoprolol succinate (TOPROL-XL) 25 MG 24 hr tablet Take 1 tablet (25 mg total) by mouth daily. 90 tablet 3  . nitroGLYCERIN (NITROSTAT) 0.4 MG SL tablet Place 1 tablet (0.4 mg total) under the tongue every 5 (five) minutes as needed for chest pain. 25 tablet PRN   No current facility-administered medications for this visit.    Allergies:   Atenolol, Bisoprolol, Prednisone, Amoxicillin-pot clavulanate, Hctz [hydrochlorothiazide], and Latex    Social History:  The patient  reports that she has never smoked. She has never used smokeless tobacco. She reports that she does not drink alcohol and does not use drugs.   Family History:  The patient's family history includes Dementia in her brother and father; Heart attack in her brother; Hypertension in her sister; Stroke in her brother, maternal grandfather, maternal grandmother, mother, paternal grandfather, paternal grandmother, and sister.    ROS:  Please see the history of present illness.   Otherwise, review of systems are positive for none.   All other systems are reviewed and negative.    PHYSICAL EXAM: VS:  BP (!) 142/82   Pulse 64   Ht 5\' 2"  (1.575 m)   Wt 113 lb 9.6 oz (51.5 kg)   LMP  (LMP Unknown)   SpO2 98%   BMI 20.78 kg/m  , BMI Body mass index is 20.78 kg/m. GENERAL:  Well appearing HEENT: Pupils equal round and reactive, fundi not visualized, oral mucosa unremarkable NECK:  No jugular venous distention,  waveform within normal limits, carotid upstroke brisk and symmetric, no bruits LUNGS:  Clear to auscultation bilaterally HEART:  RRR.  PMI not displaced or sustained,S1 and S2 within normal limits, no S3, no S4, no clicks, no rubs, no murmurs ABD:  Flat, positive bowel sounds normal in frequency in pitch, no bruits, no rebound, no guarding, no midline pulsatile mass, no hepatomegaly, no splenomegaly EXT:  2 plus pulses throughout, no edema, no cyanosis no clubbing SKIN:  No rashes no nodules NEURO:  Cranial nerves II through XII grossly intact, motor grossly intact throughout PSYCH:  Cognitively intact, oriented to person place and time   EKG:  EKG is not ordered today. The ekg ordered 11/14/19 demonstrates sinus rhythm.  Rate 62 bpm. Nonspecific ST changes.   Echo 12/02/19: 1. Left ventricular ejection fraction, by estimation, is 55 to 60%. The  left ventricle has normal function. The left ventricle has no regional  wall motion abnormalities. Left ventricular diastolic parameters are  consistent with Grade I diastolic  dysfunction (impaired relaxation). The average left ventricular global  longitudinal strain is -20.4 %.  2. Right ventricular systolic function is normal. The right ventricular  size is normal.  3. The mitral valve is abnormal. Trivial mitral valve regurgitation.  4. The aortic valve is tricuspid. Aortic valve regurgitation is not  visualized.  5. The inferior vena cava is normal in size with greater than 50%  respiratory variability, suggesting right atrial pressure of 3 mmHg.   Carotid Doppler 12/03/19: Normal  30 Day Event Monitor 01/2020:  Quality: Fair.  Baseline artifact. Predominant rhythm: sinus rhythm Average heart rate: 69 bpm Max heart rate: 131 bpm Min heart rate: 47 bpm Pauses >2.5 seconds: none  Patient did submit a symptom diary.  She reported multiple episodes of chest pain and fluttering.  At which time most of the time sinus rhythm with  noted.  She did have occasional PACs or PVCs. 4 beats of atrial tachycardia.  Ambulatory monitor 08/17/16:  NSR  with PVCs, trigeminy, bigeminy intermittently.  No AFib or pathologic tachyarrhythmias noted.   Lexiscan Myoview 07/30/14:  Scattered PVCs.  No ischemia.  Not gated due to frequent ectopy.  Recent Labs: 11/24/2019: Hemoglobin 13.7; Magnesium 2.0; Platelets 421; TSH 3.000 01/14/2020: ALT 29 02/24/2020: BUN 6; Creatinine, Ser 0.81; Potassium 4.5; Sodium 135    Lipid Panel    Component Value Date/Time   CHOL 174 01/14/2020 1535   TRIG 160 (H) 01/14/2020 1535   HDL 75 01/14/2020 1535   CHOLHDL 2.3 01/14/2020 1535   LDLCALC 72 01/14/2020 1535      Wt Readings from Last 3 Encounters:  09/21/20 113 lb 9.6 oz (51.5 kg)  05/11/20 111 lb 6.4 oz (50.5 kg)  05/06/20 112 lb 1.6 oz (50.8 kg)      ASSESSMENT AND PLAN: # Chest pain:  Ms. Latshaw had an episode of exertional chest pain.  Since that time she has not had any recurrent symptoms.  We did discuss the importance of going to the hospital she has another episode.  We will give her some nitroglycerin to take 5 mg sublingual as needed up to 3 doses.  Continue aspirin.  We will get a Lexiscan Myoview to assess evaluate for ischemia.  We discussed the risk and benefits of Lexiscan and she is amenable with proceeding.  She understands that should this be positive the neck step would be cardiac catheterization.  We will increase the dose of metoprolol to 25 mg daily.  # Palpitations:  # PVCs: # Ventricular bigeminy/trigeminy: Ms. Hunte has a history of frequent PVCs that were also noted on her ambulatory monitor.  Symptoms have been improved on metoprolol.  Increase the dose to 25mg .   # Bruits:  Carotid bruit noted on exam.   Carotids were normal on Doppler 12/2019.  # Essential hypertension: Blood pressure is poorly controlled.  She developed hyponatremia on hydrochlorothiazide.  Check BMP.  Her blood pressure has been  labile.  Suspect some of this is attributable to her pain and stress.  We will increase metoprolol to 25 mg daily.  I am hesitant to increase too much because she is also had some low blood pressures at home.  Advised her to check her blood pressure twice daily and bring to follow-up.  Continue amlodipine, lisinopril, and increase metoprolol as above.  # Hyperlipdemia:   She had myalgias on rosuvastatin.  She is tolerating intermittent dosing of rosuvastatin.  We will plan to check her lipids when she follows up next time.  Current medicines are reviewed at length with the patient today.  The patient does not have concerns regarding medicines.  The following changes have been made: Increase metoprolol to 25mg   Labs/ tests ordered today include:   Orders Placed This Encounter  Procedures  . Basic metabolic panel  . MYOCARDIAL PERFUSION IMAGING     Disposition:   FU with Ivoree Felmlee C. Oval Linsey, MD, [facility] in 2 months.     Signed, Thoams Siefert C. Oval Linsey, MD, [facility]  09/21/2020 5:05 PM    [place]

## 2020-09-22 DIAGNOSIS — H26493 Other secondary cataract, bilateral: Secondary | ICD-10-CM | POA: Diagnosis not present

## 2020-09-22 DIAGNOSIS — Z961 Presence of intraocular lens: Secondary | ICD-10-CM | POA: Diagnosis not present

## 2020-09-22 DIAGNOSIS — H5000 Unspecified esotropia: Secondary | ICD-10-CM | POA: Diagnosis not present

## 2020-09-22 DIAGNOSIS — H532 Diplopia: Secondary | ICD-10-CM | POA: Diagnosis not present

## 2020-09-27 DIAGNOSIS — M67834 Other specified disorders of tendon, left wrist: Secondary | ICD-10-CM | POA: Diagnosis not present

## 2020-09-27 DIAGNOSIS — M13842 Other specified arthritis, left hand: Secondary | ICD-10-CM | POA: Diagnosis not present

## 2020-09-28 ENCOUNTER — Telehealth: Payer: Self-pay | Admitting: Cardiovascular Disease

## 2020-09-28 NOTE — Telephone Encounter (Signed)
    Dr. Allena Katz calling to follow up prior auth. Per nurse only Dr. Duke Salvia can do peer to peer since she is the ordering provider. Advised she will be back on monday

## 2020-09-28 NOTE — Telephone Encounter (Signed)
[employer] on behalf of [employer] called. They requested a Prior Auth for the patient's upcoming Nuclear Stress Test.  They inquired if the patient has any orthopedic or vascular issues that would prevent the patient from exercising.  Please call [phone] and use case # [id number] for reference.  Any information can be faxed to [phone]

## 2020-09-30 ENCOUNTER — Telehealth (HOSPITAL_COMMUNITY): Payer: Self-pay | Admitting: *Deleted

## 2020-09-30 NOTE — Telephone Encounter (Signed)
Close encounter 

## 2020-10-04 NOTE — Telephone Encounter (Signed)
Good from 1-4 to 2-3 has been approved per [employer] [id number]

## 2020-10-05 ENCOUNTER — Ambulatory Visit (HOSPITAL_COMMUNITY)
Admission: RE | Admit: 2020-10-05 | Discharge: 2020-10-05 | Disposition: A | Discharge status: Home / Self Care | Payer: Medicare PPO | Source: Ambulatory Visit | Attending: Cardiovascular Disease | Admitting: Cardiovascular Disease

## 2020-10-05 ENCOUNTER — Other Ambulatory Visit: Payer: Self-pay

## 2020-10-05 DIAGNOSIS — R079 Chest pain, unspecified: Secondary | ICD-10-CM

## 2020-10-05 LAB — MYOCARDIAL PERFUSION IMAGING
LV dias vol: 62 mL (ref 46–106)
LV sys vol: 18 mL
Peak HR: 96 {beats}/min
Rest HR: 51 {beats}/min
SDS: 7
SRS: 2
SSS: 9
TID: 0.86

## 2020-10-05 MED ORDER — TECHNETIUM TC 99M TETROFOSMIN IV KIT
10.9000 | PACK | Freq: Once | INTRAVENOUS | Status: AC | PRN
  Administered 2020-10-05: 10.9 via INTRAVENOUS
  Filled 2020-10-05: qty 11

## 2020-10-05 MED ORDER — REGADENOSON 0.4 MG/5ML IV SOLN
0.4000 mg | Freq: Once | INTRAVENOUS | Status: AC
  Administered 2020-10-05: 0.4 mg via INTRAVENOUS

## 2020-10-05 MED ORDER — TECHNETIUM TC 99M TETROFOSMIN IV KIT
31.0000 | PACK | Freq: Once | INTRAVENOUS | Status: AC | PRN
  Administered 2020-10-05: 31 via INTRAVENOUS
  Filled 2020-10-05: qty 31

## 2020-10-06 NOTE — Telephone Encounter (Signed)
Thank you :)

## 2020-10-08 ENCOUNTER — Telehealth: Payer: Self-pay | Admitting: Cardiovascular Disease

## 2020-10-08 MED ORDER — METOPROLOL SUCCINATE ER 25 MG PO TB24: 25.0000 mg | ORAL_TABLET | Freq: Every day | ORAL | 3 refills | Status: DC

## 2020-10-08 NOTE — Telephone Encounter (Signed)
Pt c/o medication issue:  1. Name of Medication: metoprolol succinate (TOPROL-XL) 25 MG 24 hr tablet  2. How are you currently taking this medication (dosage and times per day)? Has not started  3. Are you having a reaction (difficulty breathing--STAT)? no  4. What is your medication issue? Patient states the new medication was not sent to the pharmacy.

## 2020-10-08 NOTE — Telephone Encounter (Signed)
Patient aware Rx sent in again today Reviewed stress test results No further assistance needed

## 2020-11-11 ENCOUNTER — Telehealth: Payer: Self-pay | Admitting: Family Medicine

## 2020-11-11 NOTE — Telephone Encounter (Signed)
   SHARNESE HEATH DOB: 07-21-1951 MRN: [id number]   RIDER WAIVER AND RELEASE OF LIABILITY  For purposes of improving physical access to our facilities, [facility] is pleased to partner with third parties to provide [facility] patients or other authorized individuals the option of convenient, on-demand ground transportation services (the Technical bre[employer]") through use of the technology service that enables users to request on-demand ground transportation from independent third-party providers.  By opting to use and accept these [employer], I, the undersigned, hereby agree on behalf of myself, and on behalf of any minor child using the [employer] for whom I am the parent or legal guardian, as follows:  1. Government social research officer provided to me are provided by independent third-party transportation providers who are not [employer] or employees and who are unaffiliated with [employer]. 2. [facility] is neither a transportation carrier nor a common or public carrier. 3. [facility] has no control over the quality or safety of the transportation that occurs as a result of the [employer]. 4. [facility] [place] [facility] cannot guarantee that any third-party transportation provider will complete any arranged transportation service. 5. [facility] makes no representation, warranty, or guarantee regarding the reliability, timeliness, quality, safety, suitability, or availability of any of the Transport Services or that they will be error free. 6. I fully understand that traveling by vehicle involves risks and dangers of serious bodily injury, including permanent disability, paralysis, and death. I agree, on behalf of myself and on behalf of any minor child using the Transport Services for whom I am the parent or legal guardian, that the entire risk arising out of my use of the [employer] remains solely with me, to the maximum extent permitted under applicable law. 7. The [employer] are provided "as is" and "as available." [facility] disclaims all representations and warranties, express, implied or statutory, not expressly set out in these terms, including the implied warranties of merchantability and fitness for a particular purpose. 8. I hereby waive and release [facility], its agents, employees, officers, directors, representatives, insurers, attorneys, assigns, successors, subsidiaries, and affiliates from any and all past, present, or future claims, demands, liabilities, actions, causes of action, or suits of any kind directly or indirectly arising from acceptance and use of the [employer]. 9. I further waive and release [facility] and its affiliates from all present and future liability and responsibility for any injury or death to persons or damages to property caused by or related to the use of the [employer]. 10. I have read this Waiver and Release of Liability, and I understand the terms used in it and their legal significance. This Waiver is freely and voluntarily given with the understanding that my right (as well as the right of any minor child for whom I am the parent or legal guardian using the [employer]) to legal recourse against  in connection with the [employer] is knowingly surrendered in return for use of these services.   I attest that I read the consent document to Leretha Dykes, gave Ms. Fojtik the opportunity to ask questions and answered the questions asked (if any). I affirm that Leretha Dykes then provided consent for she's participation in this program.     Katy Apo

## 2020-11-16 ENCOUNTER — Telehealth: Payer: Self-pay | Admitting: Cardiovascular Disease

## 2020-11-16 NOTE — Telephone Encounter (Signed)
Results have been mailed

## 2020-11-16 NOTE — Telephone Encounter (Signed)
Patient had a stress test done on 10/05/20 and she would like those results mailed to her house.   [place] [id number]

## 2020-12-02 ENCOUNTER — Telehealth: Payer: Self-pay | Admitting: Cardiovascular Disease

## 2020-12-02 ENCOUNTER — Encounter: Payer: Self-pay | Admitting: Cardiovascular Disease

## 2020-12-02 ENCOUNTER — Ambulatory Visit: Payer: Medicare PPO | Admitting: Cardiovascular Disease

## 2020-12-02 ENCOUNTER — Other Ambulatory Visit: Payer: Self-pay

## 2020-12-02 VITALS — BP 128/72 | HR 72 | Ht 62.0 in | Wt 115.8 lb

## 2020-12-02 DIAGNOSIS — R0989 Other specified symptoms and signs involving the circulatory and respiratory systems: Secondary | ICD-10-CM

## 2020-12-02 DIAGNOSIS — I491 Atrial premature depolarization: Secondary | ICD-10-CM | POA: Diagnosis not present

## 2020-12-02 DIAGNOSIS — I10 Essential (primary) hypertension: Secondary | ICD-10-CM

## 2020-12-02 DIAGNOSIS — I493 Ventricular premature depolarization: Secondary | ICD-10-CM | POA: Diagnosis not present

## 2020-12-02 DIAGNOSIS — Z5181 Encounter for therapeutic drug level monitoring: Secondary | ICD-10-CM | POA: Diagnosis not present

## 2020-12-02 DIAGNOSIS — E7800 Pure hypercholesterolemia, unspecified: Secondary | ICD-10-CM | POA: Diagnosis not present

## 2020-12-02 MED ORDER — PRAVASTATIN SODIUM 10 MG PO TABS: 10.0000 mg | ORAL_TABLET | Freq: Every day | ORAL | 3 refills | Status: DC

## 2020-12-02 NOTE — Telephone Encounter (Signed)
3.3.22  Pt. Placed on wl for follow up w/Dr. Oval Linsey. Only wants to see Dr. Oval Linsey, not APP's. Declined morning appointment, only wants afternoon. lp

## 2020-12-02 NOTE — Patient Instructions (Signed)
Medication Instructions:  STOP ROSUVASTATIN   START PRAVASTATIN 10 MG DAILY   *If you need a refill on your cardiac medications before your next appointment, please call your pharmacy*  Lab Work: FASTING LP/CMET IN 3 MONTHS  If you have labs (blood work) drawn today and your tests are completely normal, you will receive your results only by: Marland Kitchen MyChart Message (if you have MyChart) OR . A paper copy in the mail If you have any lab test that is abnormal or we need to change your treatment, we will call you to review the results.  Testing/Procedures: NONE  Follow-Up: At [facility], you and your health needs are our priority.  As part of our continuing mission to provide you with exceptional heart care, we have created designated Provider Care Teams.  These Care Teams include your primary Cardiologist (physician) and Advanced Practice Providers (APPs -  Physician Assistants and Nurse Practitioners) who all work together to provide you with the care you need, when you need it.  We recommend signing up for the patient portal called "MyChart".  Sign up information is provided on this After Visit Summary.  MyChart is used to connect with patients for Virtual Visits (Telemedicine).  Patients are able to view lab/test results, encounter notes, upcoming appointments, etc.  Non-urgent messages can be sent to your provider as well.   To learn more about what you can do with MyChart, go to [email or web address].    Your next appointment:   3-4 month(s)  The format for your next appointment:   In Person  Provider:   You may see DR [facility]  or one of the following Advanced Practice Providers on your designated Care Team:    Sande Rives, PA-C  Coletta Memos, FNP  Other Instructions  AFTER YOU GET YOUR NEW BLOOD PRESSURE MACHINE  MONITOR YOUR BLOOD PRESSURE AND BRING YOUR READINGS TO YOUR FOLLOW UP APPOINTMENT

## 2020-12-02 NOTE — Progress Notes (Signed)
Cardiology Office Note   Date:  12/02/2020   ID:  Angela Buchanan, DOB [birth year], MRN [id number]  PCP:  [name], MD  Cardiologist:   Skeet Latch, MD   No chief complaint on file.  History of Present Illness: Angela Buchanan is a 70 y.o. female with Celiac disease, breast cancer s/p lumpectomy and XRT, R subclavian DVT here for follow-up.  In 2017 Angela Buchanan was seen for palpitations.  She wore an ambulatory monitor that revealed sinus rhythm with frequent PVCs.  She also had ventricular bigeminy and trigeminy.  She had a Lexiscan Myoview in 2015 that was negative for ischemia.  It was not gated due to PVCs. She was again seen 11/2019 for the evaluation of palpitations and CV risk assessment.  Her blood pressure is elevated so lisinopril was increased to 40mg .  Lipids were elevated so rosuvastatin was started 11/2019.  She developed severe muscle discomfort. She was asked to reduce the dose and take it every other day.  She wore a 30-day monitor that revealed some PACs and PVCs but was otherwise unremarkable.  She was started on metoprolol.  She had an echo 12/2019 that revealed normal EF and grade 1 diastolic dysfunction.  Carotid Dopplers were normal.  Since starting the metoprolol she has been feeling much better.  She followed up with our pharmacist and her BP was poorly controlled.  HCTZ was increased and metoprolol was reduced.    Angela Buchanan developed hyponatremia and poorly controlled blood pressure. Hydrochlorothiazide was discontinued and she was started on amlodipine.  Her BP was then labile ranging from 100/72 to 174/84.  She was under a lot of stress caring for her husband was stage IV cancer.  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  Sometimes the pain is so excruciating she wants to pull her fingers off.  She had an episode of chest pain and shortness of breath and was referred for [facility] 10/2020 that revealed LVEF 70%  with a fixed perfusion defect at the apex, apical anterior, lateral, and inferior walls not to be artifact.  There is no ischemia.  Metoprolol was increased.  Lately Angela Buchanan has been feeling tired.  It is intermittant.  She struggles with insomnia.  She walks a lot for exercise daily and has no exertional chest pain or shortness of breath.  She tried taking rosuvastatin but had myalgias. She saw Dr. Amedeo Plenty about her hand and was diagnosed with osteoarthritis.  He recommended tumeric and fish oil.  She has less discomfort in her fingers.  Lately she has not been taking amlodipine.  She does not think that her machine at home is accurate.  Her blood pressure at home is ranging in the 130s to 180s.  She otherwise feels well and has not had any recurrent chest pain.  Her breathing has been stable.   Past Medical History:  Diagnosis Date  . Bigeminy    no current med.  . Breast cancer ([place]) 06/2017   right  . Bruises easily   . Carotid bruit 11/13/2019  . Dental crowns present   . History of diverticulitis   . Hypertension    states under control with med., has been on med. x 5 yr.  Marland Kitchen PAC (premature atrial contraction) 05/11/2020  . Personal history of radiation therapy    2018  . Pure hypercholesterolemia 11/13/2019  . PVC (premature ventricular contraction) 05/11/2020  .  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 Seasonal allergies   . 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    Past Surgical History:  Procedure Laterality Date  . ABDOMINAL HYSTERECTOMY     partial  . BREAST EXCISIONAL BIOPSY Right 06/22/2017   Malignant  . BREAST LUMPECTOMY Right 06/22/2017   Malignant  . BREAST LUMPECTOMY WITH RADIOACTIVE SEED AND SENTINEL LYMPH NODE BIOPSY Right 06/22/2017   Procedure: RIGHT BREAST LUMPECTOMY WITH RADIOACTIVE SEED AND RIGHT AXILLARY DEEP SENTINEL LYMPH NODE BIOPSY WITH BLUE DYE INJECTION;  Surgeon: Fanny Skates, MD;  Location: [facility];  Service: General;  Laterality:  Right;  . BREAST LUMPECTOMY WITH RADIOACTIVE SEED LOCALIZATION Left 06/22/2017   Procedure: LEFT BREAST LUMPECTOMY WITH RADIOACTIVE SEED LOCALIZATION;  Surgeon: Fanny Skates, MD;  Location: [place];  Service: General;  Laterality: Left;  . CATARACT EXTRACTION W/ INTRAOCULAR LENS  IMPLANT, BILATERAL Bilateral   . EXCISION OF BREAST BIOPSY Left 06/22/2017   benign  . TONSILLECTOMY     age 30     Current Outpatient Medications  Medication Sig Dispense Refill  . ALPRAZolam (XANAX) 0.25 MG tablet 1 tablet    . anastrozole (ARIMIDEX) 1 MG tablet Take 1 tablet (1 mg total) by mouth daily. 90 tablet 3  . aspirin EC 81 MG tablet Take 1 tablet (81 mg total) by mouth daily.    Marland Kitchen b complex vitamins tablet Take 1 tablet by mouth daily.    . Biotin 1000 MCG tablet Take 1,000 mcg by mouth daily.    . Calcium-Magnesium-Vitamin D [phone] LIQD Take 1 tablet by mouth daily.    . cholecalciferol (VITAMIN D) 1000 units tablet Take 2,000 Units by mouth daily.    . citalopram (CELEXA) 10 MG tablet Take 10 mg by mouth daily as needed (anxiety).     Marland Kitchen diclofenac Sodium (VOLTAREN) 1 % GEL Apply 2 g topically 4 (four) times daily as needed. 50 g 1  . fluticasone (FLONASE) 50 MCG/ACT nasal spray     . ibuprofen (ADVIL) 800 MG tablet TAKE 1 TABLET BY MOUTH THREE TIMES DAILY WITH MEALS FOR 14 DAYS    . levocetirizine (XYZAL) 5 MG tablet levocetirizine 5 mg tablet  TAKE 1 TABLET BY MOUTH IN THE EVENING    . levothyroxine (SYNTHROID) 25 MCG tablet levothyroxine 25 mcg tablet  TAKE 1 TABLET BY MOUTH EVERY DAY    . lisinopril (ZESTRIL) 40 MG tablet Take 1 tablet (40 mg total) by mouth daily. 90 tablet 3  . meloxicam (MOBIC) 15 MG tablet meloxicam 15 mg tablet    . metoprolol succinate (TOPROL-XL) 25 MG 24 hr tablet Take 1 tablet (25 mg total) by mouth daily. 90 tablet 3  . montelukast (SINGULAIR) 10 MG tablet Take 10 mg by mouth at bedtime.     . Multiple Vitamin (MULTIVITAMIN WITH MINERALS)  TABS tablet Take 1 tablet by mouth daily.    . nitroGLYCERIN (NITROSTAT) 0.4 MG SL tablet Place 1 tablet (0.4 mg total) under the tongue every 5 (five) minutes as needed for chest pain. 25 tablet PRN  . rosuvastatin (CRESTOR) 20 MG tablet rosuvastatin 20 mg tablet    . traMADol (ULTRAM) 50 MG tablet Take 0.5-1 tablets (25-50 mg total) by mouth every 6 (six) hours as needed. 30 tablet 0  . amLODipine (NORVASC) 5 MG tablet Take 1 tablet (5 mg total) by mouth daily. 90 tablet 1   No current facility-administered medications for this visit.    Allergies:   Atenolol,  Bisoprolol, Prednisone, Amoxicillin-pot clavulanate, Hctz [hydrochlorothiazide], and Latex    Social History:  The patient  reports that she has never smoked. She has never used smokeless tobacco. She reports that she does not drink alcohol and does not use drugs.   Family History:  The patient's family history includes Dementia in her brother and father; Heart attack in her brother; Hypertension in her sister; Stroke in her brother, maternal grandfather, maternal grandmother, mother, paternal grandfather, paternal grandmother, and sister.    ROS:  Please see the history of present illness.   Otherwise, review of systems are positive for none.   All other systems are reviewed and negative.    PHYSICAL EXAM: VS:  BP 128/72   Pulse 72   Ht 5\' 2"  (1.575 m)   Wt 115 lb 12.8 oz (52.5 kg)   LMP  (LMP Unknown)   SpO2 98%   BMI 21.18 kg/m  , BMI Body mass index is 21.18 kg/m. GENERAL:  Well appearing HEENT: Pupils equal round and reactive, fundi not visualized, oral mucosa unremarkable NECK:  No jugular venous distention, waveform within normal limits, carotid upstroke brisk and symmetric, no bruits LUNGS:  Clear to auscultation bilaterally HEART:  RRR.  PMI not displaced or sustained,S1 and S2 within normal limits, no S3, no S4, no clicks, no rubs, no murmurs ABD:  Flat, positive bowel sounds normal in frequency in pitch, no  bruits, no rebound, no guarding, no midline pulsatile mass, no hepatomegaly, no splenomegaly EXT:  2 plus pulses throughout, no edema, no cyanosis no clubbing SKIN:  No rashes no nodules NEURO:  Cranial nerves II through XII grossly intact, motor grossly intact throughout PSYCH:  Cognitively intact, oriented to person place and time   EKG:  EKG is not ordered today. The ekg ordered 11/14/19 demonstrates sinus rhythm.  Rate 62 bpm. Nonspecific ST changes.   Echo 12/02/19: 1. Left ventricular ejection fraction, by estimation, is 55 to 60%. The  left ventricle has normal function. The left ventricle has no regional  wall motion abnormalities. Left ventricular diastolic parameters are  consistent with Grade I diastolic  dysfunction (impaired relaxation). The average left ventricular global  longitudinal strain is -20.4 %.  2. Right ventricular systolic function is normal. The right ventricular  size is normal.  3. The mitral valve is abnormal. Trivial mitral valve regurgitation.  4. The aortic valve is tricuspid. Aortic valve regurgitation is not  visualized.  5. The inferior vena cava is normal in size with greater than 50%  respiratory variability, suggesting right atrial pressure of 3 mmHg.   Carotid Doppler 12/03/19: Normal  30 Day Event Monitor 01/2020:  Quality: Fair.  Baseline artifact. Predominant rhythm: sinus rhythm Average heart rate: 69 bpm Max heart rate: 131 bpm Min heart rate: 47 bpm Pauses >2.5 seconds: none  Patient did submit a symptom diary.  She reported multiple episodes of chest pain and fluttering.  At which time most of the time sinus rhythm with noted.  She did have occasional PACs or PVCs. 4 beats of atrial tachycardia.  Ambulatory monitor 08/17/16:  NSR with PVCs, trigeminy, bigeminy intermittently.  No AFib or pathologic tachyarrhythmias noted.   Lexiscan Myoview 07/30/14:  Scattered PVCs.  No ischemia.  Not gated due to frequent  ectopy.  Lexiscan Myoview 10/2020:  There was no significant ST segment deviation noted during stress. <1mm ST depression in V3/4  The left ventricular ejection fraction is hyperdynamic (>65%).  Nuclear stress EF: 70%.  Defect 1: There is  a small defect of moderate severity present in the apical anterior, apical inferior, apical lateral and apex location.  The study is normal.  This is a low risk study.   1. Moderate fixed perfusion defect at apex and apical anterior/lateral/inferior walls, with normal wall motion in this area, suggestive of artifact 2. Low risk study   Recent Labs: 01/14/2020: ALT 29 02/24/2020: BUN 6; Creatinine, Ser 0.81; Potassium 4.5; Sodium 135    Lipid Panel    Component Value Date/Time   CHOL 174 01/14/2020 1535   TRIG 160 (H) 01/14/2020 1535   HDL 75 01/14/2020 1535   CHOLHDL 2.3 01/14/2020 1535   LDLCALC 72 01/14/2020 1535      Wt Readings from Last 3 Encounters:  12/02/20 115 lb 12.8 oz (52.5 kg)  10/05/20 113 lb (51.3 kg)  09/21/20 113 lb 9.6 oz (51.5 kg)      ASSESSMENT AND PLAN:  # Chest pain:  Resolved.  Lexiscan Myoview was negative for ischemia.  She has been able to walk recently with no issues of chest pain or shortness of breath.  # Palpitations:  # PVCs: # Ventricular bigeminy/trigeminy: Ms. Cabell has a history of frequent PVCs that were also noted on her ambulatory monitor.  Symptoms have been improved on metoprolol.  Continue metoprolol 25 mg daily..   # Bruits:  Carotid bruit noted on exam.   Carotids were normal on Doppler 12/2019.  # Essential hypertension: Blood pressure here does not correlate with the readings she is getting at home.  Her blood pressure here was well-controlled and her machine was much higher.  She is going to get a new machine.  Keep taking metoprolol and lisinopril.  Will take amlodipine off of her list for now.  Her goal is less than 130/80.  # Hyperlipdemia:   She had myalgias on rosuvastatin.   We will try pravastatin 10 mg daily.  Check lipids and a CMP in 2 to 3 months.  Current medicines are reviewed at length with the patient today.  The patient does not have concerns regarding medicines.  The following changes have been made: Increase metoprolol to 25mg   Labs/ tests ordered today include:   No orders of the defined types were placed in this encounter.    Disposition:   FU with Scottie Stanish C. Oval Linsey, MD, [facility] in 2 months.     Signed, Atticus Lemberger C. Oval Linsey, MD, [facility]  12/02/2020 3:24 PM    [facility]

## 2020-12-24 ENCOUNTER — Encounter: Payer: Self-pay | Admitting: Cardiovascular Disease

## 2021-01-14 ENCOUNTER — Other Ambulatory Visit: Payer: Self-pay | Admitting: Cardiovascular Disease

## 2021-01-14 ENCOUNTER — Telehealth: Payer: Self-pay | Admitting: Cardiovascular Disease

## 2021-01-14 NOTE — Telephone Encounter (Signed)
*  STAT* If patient is at the pharmacy, call can be transferred to refill team.   1. Which medications need to be refilled? (please list name of each medication and dose if known) lisinopril (ZESTRIL) 40 MG tablet  2. Which pharmacy/location (including street and city if local pharmacy) is medication to be sent to? [place], [place]  3. Do they need a 30 day or 90 day supply? 90  Patient states she is out of medication

## 2021-03-17 DIAGNOSIS — R5382 Chronic fatigue, unspecified: Secondary | ICD-10-CM | POA: Diagnosis not present

## 2021-03-17 DIAGNOSIS — R399 Unspecified symptoms and signs involving the genitourinary system: Secondary | ICD-10-CM | POA: Diagnosis not present

## 2021-03-17 DIAGNOSIS — H8110 Benign paroxysmal vertigo, unspecified ear: Secondary | ICD-10-CM | POA: Diagnosis not present

## 2021-03-17 DIAGNOSIS — E538 Deficiency of other specified B group vitamins: Secondary | ICD-10-CM | POA: Diagnosis not present

## 2021-03-25 DIAGNOSIS — E538 Deficiency of other specified B group vitamins: Secondary | ICD-10-CM | POA: Diagnosis not present

## 2021-04-01 DIAGNOSIS — E538 Deficiency of other specified B group vitamins: Secondary | ICD-10-CM | POA: Diagnosis not present

## 2021-04-08 DIAGNOSIS — E538 Deficiency of other specified B group vitamins: Secondary | ICD-10-CM | POA: Diagnosis not present

## 2021-04-15 DIAGNOSIS — E538 Deficiency of other specified B group vitamins: Secondary | ICD-10-CM | POA: Diagnosis not present

## 2021-04-26 DIAGNOSIS — E538 Deficiency of other specified B group vitamins: Secondary | ICD-10-CM | POA: Diagnosis not present

## 2021-05-05 ENCOUNTER — Ambulatory Visit: Payer: Medicare PPO | Admitting: Hematology and Oncology

## 2021-05-10 ENCOUNTER — Ambulatory Visit
Admission: RE | Admit: 2021-05-10 | Discharge: 2021-05-10 | Disposition: A | Discharge status: Home / Self Care | Payer: Medicare PPO | Source: Ambulatory Visit | Attending: Adult Health | Admitting: Adult Health

## 2021-05-10 ENCOUNTER — Other Ambulatory Visit: Payer: Self-pay

## 2021-05-10 DIAGNOSIS — R922 Inconclusive mammogram: Secondary | ICD-10-CM | POA: Diagnosis not present

## 2021-05-10 DIAGNOSIS — Z9889 Other specified postprocedural states: Secondary | ICD-10-CM

## 2021-05-19 ENCOUNTER — Ambulatory Visit (HOSPITAL_BASED_OUTPATIENT_CLINIC_OR_DEPARTMENT_OTHER): Payer: Medicare PPO | Admitting: Cardiovascular Disease

## 2021-06-08 DIAGNOSIS — M810 Age-related osteoporosis without current pathological fracture: Secondary | ICD-10-CM | POA: Diagnosis not present

## 2021-06-08 DIAGNOSIS — E538 Deficiency of other specified B group vitamins: Secondary | ICD-10-CM | POA: Diagnosis not present

## 2021-06-08 DIAGNOSIS — E039 Hypothyroidism, unspecified: Secondary | ICD-10-CM | POA: Diagnosis not present

## 2021-06-08 DIAGNOSIS — E785 Hyperlipidemia, unspecified: Secondary | ICD-10-CM | POA: Diagnosis not present

## 2021-06-08 DIAGNOSIS — Z0000 Encounter for general adult medical examination without abnormal findings: Secondary | ICD-10-CM | POA: Diagnosis not present

## 2021-06-08 DIAGNOSIS — I10 Essential (primary) hypertension: Secondary | ICD-10-CM | POA: Diagnosis not present

## 2021-06-08 DIAGNOSIS — E041 Nontoxic single thyroid nodule: Secondary | ICD-10-CM | POA: Diagnosis not present

## 2021-06-15 DIAGNOSIS — E538 Deficiency of other specified B group vitamins: Secondary | ICD-10-CM | POA: Diagnosis not present

## 2021-07-12 DIAGNOSIS — Z23 Encounter for immunization: Secondary | ICD-10-CM | POA: Diagnosis not present

## 2021-07-13 DIAGNOSIS — J069 Acute upper respiratory infection, unspecified: Secondary | ICD-10-CM | POA: Diagnosis not present

## 2021-07-13 DIAGNOSIS — J0100 Acute maxillary sinusitis, unspecified: Secondary | ICD-10-CM | POA: Diagnosis not present

## 2021-07-13 DIAGNOSIS — Z03818 Encounter for observation for suspected exposure to other biological agents ruled out: Secondary | ICD-10-CM | POA: Diagnosis not present

## 2021-07-15 DIAGNOSIS — E538 Deficiency of other specified B group vitamins: Secondary | ICD-10-CM | POA: Diagnosis not present

## 2021-07-19 ENCOUNTER — Ambulatory Visit (HOSPITAL_BASED_OUTPATIENT_CLINIC_OR_DEPARTMENT_OTHER): Payer: Medicare PPO | Admitting: Cardiovascular Disease

## 2021-07-19 ENCOUNTER — Other Ambulatory Visit: Payer: Self-pay

## 2021-07-19 ENCOUNTER — Encounter (HOSPITAL_BASED_OUTPATIENT_CLINIC_OR_DEPARTMENT_OTHER): Payer: Self-pay | Admitting: Cardiovascular Disease

## 2021-07-19 DIAGNOSIS — I491 Atrial premature depolarization: Secondary | ICD-10-CM | POA: Diagnosis not present

## 2021-07-19 DIAGNOSIS — R0789 Other chest pain: Secondary | ICD-10-CM | POA: Diagnosis not present

## 2021-07-19 DIAGNOSIS — I10 Essential (primary) hypertension: Secondary | ICD-10-CM | POA: Diagnosis not present

## 2021-07-19 DIAGNOSIS — E7800 Pure hypercholesterolemia, unspecified: Secondary | ICD-10-CM | POA: Diagnosis not present

## 2021-07-19 HISTORY — DX: Other chest pain: R07.89

## 2021-07-19 MED ORDER — AMLODIPINE BESYLATE 5 MG PO TABS
5.0000 mg | ORAL_TABLET | Freq: Every day | ORAL | 3 refills | Status: DC
Start: 2021-07-19 — End: 2021-12-30

## 2021-07-19 NOTE — Assessment & Plan Note (Signed)
Blood pressure elevated both here and at home.  She developed hyponatremia on hydrochlorothiazide.  Add amlodipine 5 mg daily.  Continue lisinopril and metoprolol.

## 2021-07-19 NOTE — Assessment & Plan Note (Signed)
Stable on metoprolol. °

## 2021-07-19 NOTE — Patient Instructions (Signed)
Medication Instructions:  START AMLODIPINE 5 MG DAILY   *If you need a refill on your cardiac medications before your next appointment, please call your pharmacy*  Lab Work: BMET [address] CT  If you have labs (blood work) drawn today and your tests are completely normal, you will receive your results only by: [facility] (if you have MyChart) OR A paper copy in the mail If you have any lab test that is abnormal or we need to change your treatment, we will call you to review the results.  Testing/Procedures: Your physician has requested that you have cardiac CT. Cardiac computed tomography (CT) is a painless test that uses an x-ray machine to take clear, detailed pictures of your heart. For further information please visit [email or web address]. Please follow instruction sheet as given. ONCE INSURANCE HAS BEEN REVIEWED THE OFFICE WILL CALL YOU TO SCHEDULE   Follow-Up: At [facility], you and your health needs are our priority.  As part of our continuing mission to provide you with exceptional heart care, we have created designated Provider Care Teams.  These Care Teams include your primary Cardiologist (physician) and Advanced Practice Providers (APPs -  Physician Assistants and Nurse Practitioners) who all work together to provide you with the care you need, when you need it.  We recommend signing up for the patient portal called "MyChart".  Sign up information is provided on this After Visit Summary.  MyChart is used to connect with patients for Virtual Visits (Telemedicine).  Patients are able to view lab/test results, encounter notes, upcoming appointments, etc.  Non-urgent messages can be sent to your provider as well.   To learn more about what you can do with MyChart, go to [email or web address].    Your next appointment:   2 month(s)  The format for your next appointment:   In Person  Provider:   Laurann Montana, NP  Your physician recommends that you  schedule a follow-up appointment in: [address] [facility]   Other Instructions    Your cardiac CT will be scheduled at one of the below locations:   [facility] [address] [place], [place] [address] [phone]  [place] [address] [place], [place] [address] [phone]  If scheduled at [facility], please arrive at the [facility] main entrance (entrance A) of [facility] 30 minutes prior to test start time. You can use the FREE valet parking offered at the main entrance (encouraged to control the heart rate for the test) Proceed to the [facility] Radiology Department (first floor) to check-in and test prep.  If scheduled at [facility], please arrive 15 mins early for check-in and test prep.  Please follow these instructions carefully (unless otherwise directed):  Hold all erectile dysfunction medications at least 3 days (72 hrs) prior to test.  On the Night Before the Test: Be sure to Drink plenty of water. Do not consume any caffeinated/decaffeinated beverages or chocolate 12 hours prior to your test. Do not take any antihistamines 12 hours prior to your test. If the patient has contrast allergy: Patient will need a prescription for Prednisone and very clear instructions (as follows): Prednisone 50 mg - take 13 hours prior to test Take another Prednisone 50 mg 7 hours prior to test Take another Prednisone 50 mg 1 hour prior to test Take Benadryl 50 mg 1 hour prior to test Patient must complete all four  doses of above prophylactic medications. Patient will need a ride after test due to Benadryl.  On the Day of the Test: Drink plenty of water until 1 hour prior to the test. Do not eat any food 4 hours prior to the test. You may take your regular medications prior to the test.  Take metoprolol (Lopressor) two hours prior to test. HOLD  Furosemide/Hydrochlorothiazide morning of the test. FEMALES- please wear underwire-free bra if available, avoid dresses & tight clothing  After the Test: Drink plenty of water. After receiving IV contrast, you may experience a mild flushed feeling. This is normal. On occasion, you may experience a mild rash up to 24 hours after the test. This is not dangerous. If this occurs, you can take Benadryl 25 mg and increase your fluid intake. If you experience trouble breathing, this can be serious. If it is severe call 911 IMMEDIATELY. If it is mild, please call our office. If you take any of these medications: Glipizide/Metformin, Avandament, Glucavance, please do not take 48 hours after completing test unless otherwise instructed.  Please allow 2-4 weeks for scheduling of routine cardiac CTs. Some insurance companies require a pre-authorization which may delay scheduling of this test.   For non-scheduling related questions, please contact the cardiac imaging nurse navigator should you have any questions/concerns: Marchia Bond, Cardiac Imaging Nurse Navigator Gordy Clement, Cardiac Imaging Nurse Navigator [facility] Direct Office Dial: [phone]   For scheduling needs, including cancellations and rescheduling, please call [place], [phone].  Cardiac CT Angiogram A cardiac CT angiogram is a procedure to look at the heart and the area around the heart. It may be done to help find the cause of chest pains or other symptoms of heart disease. During this procedure, a substance called contrast dye is injected into the blood vessels in the area to be checked. A large X-ray machine, called a CT scanner, then takes detailed pictures of the heart and the surrounding area. The procedure is also sometimes called a coronary CT angiogram, coronary artery scanning, or CTA. A cardiac CT angiogram allows the health care provider to see how well blood is flowing to and from the  heart. The health care provider will be able to see if there are any problems, such as: Blockage or narrowing of the coronary arteries in the heart. Fluid around the heart. Signs of weakness or disease in the muscles, valves, and tissues of the heart. Tell a health care provider about: Any allergies you have. This is especially important if you have had a previous allergic reaction to contrast dye. All medicines you are taking, including vitamins, herbs, eye drops, creams, and over-the-counter medicines. Any blood disorders you have. Any surgeries you have had. Any medical conditions you have. Whether you are pregnant or may be pregnant. Any anxiety disorders, chronic pain, or other conditions you have that may increase your stress or prevent you from lying still. What are the risks? Generally, this is a safe procedure. However, problems may occur, including: Bleeding. Infection. Allergic reactions to medicines or dyes. Damage to other structures or organs. Kidney damage from the contrast dye that is used. Increased risk of cancer from radiation exposure. This risk is low. Talk with your health care provider about: The risks and benefits of testing. How you can receive the lowest dose of radiation. What happens before the procedure? Wear comfortable clothing and remove any jewelry, glasses, dentures, and hearing aids. Follow instructions from your health care provider about eating  and drinking. This may include: For 12 hours before the procedure -- avoid caffeine. This includes tea, coffee, soda, energy drinks, and diet pills. Drink plenty of water or other fluids that do not have caffeine in them. Being well hydrated can prevent complications. For 4-6 hours before the procedure -- stop eating and drinking. The contrast dye can cause nausea, but this is less likely if your stomach is empty. Ask your health care provider about changing or stopping your regular medicines. This is especially  important if you are taking diabetes medicines, blood thinners, or medicines to treat problems with erections (erectile dysfunction). What happens during the procedure?  Hair on your chest may need to be removed so that small sticky patches called electrodes can be placed on your chest. These will transmit information that helps to monitor your heart during the procedure. An IV will be inserted into one of your veins. You might be given a medicine to control your heart rate during the procedure. This will help to ensure that good images are obtained. You will be asked to lie on an exam table. This table will slide in and out of the CT machine during the procedure. Contrast dye will be injected into the IV. You might feel warm, or you may get a metallic taste in your mouth. You will be given a medicine called nitroglycerin. This will relax or dilate the arteries in your heart. The table that you are lying on will move into the CT machine tunnel for the scan. The person running the machine will give you instructions while the scans are being done. You may be asked to: Keep your arms above your head. Hold your breath. Stay very still, even if the table is moving. When the scanning is complete, you will be moved out of the machine. The IV will be removed. The procedure may vary among health care providers and hospitals. What can I expect after the procedure? After your procedure, it is common to have: A metallic taste in your mouth from the contrast dye. A feeling of warmth. A headache from the nitroglycerin. Follow these instructions at home: Take over-the-counter and prescription medicines only as told by your health care provider. If you are told, drink enough fluid to keep your urine pale yellow. This will help to flush the contrast dye out of your body. Most people can return to their normal activities right after the procedure. Ask your health care provider what activities are safe for  you. It is up to you to get the results of your procedure. Ask your health care provider, or the department that is doing the procedure, when your results will be ready. Keep all follow-up visits as told by your health care provider. This is important. Contact a health care provider if: You have any symptoms of allergy to the contrast dye. These include: Shortness of breath. Rash or hives. A racing heartbeat. Summary A cardiac CT angiogram is a procedure to look at the heart and the area around the heart. It may be done to help find the cause of chest pains or other symptoms of heart disease. During this procedure, a large X-ray machine, called a CT scanner, takes detailed pictures of the heart and the surrounding area after a contrast dye has been injected into blood vessels in the area. Ask your health care provider about changing or stopping your regular medicines before the procedure. This is especially important if you are taking diabetes medicines, blood thinners, or medicines  to treat erectile dysfunction. If you are told, drink enough fluid to keep your urine pale yellow. This will help to flush the contrast dye out of your body. This information is not intended to replace advice given to you by your health care provider. Make sure you discuss any questions you have with your health care provider. Document Revised: 05/14/2019 Document Reviewed: 05/14/2019 Elsevier Patient Education  [place].

## 2021-07-19 NOTE — Assessment & Plan Note (Signed)
She is tolerating pravastatin better than rosuvastatin.  She did have some joint discomfort but it didn't improve when she stopped pravastatin, so she will restart it.

## 2021-07-19 NOTE — Assessment & Plan Note (Signed)
Patient is atypical.  However her nuclear stress test previously was mildly abnormal with evidence of prior infarct versus artifact.  We will get a coronary CTA to ensure that she does not have obstructive coronary disease.  This would also inform her lipid goals.

## 2021-07-19 NOTE — Progress Notes (Signed)
Cardiology Office Note   Date:  07/19/2021   ID:  [name], DOB April 11, 1951, MRN [id number]  PCP:  Pcp, No  Cardiologist:   Skeet Latch, MD   No chief complaint on file.  History of Present Illness: Angela Buchanan is a 70 y.o. female with Celiac disease, breast cancer s/p lumpectomy and XRT, R subclavian DVT here for follow-up.  In 2017 Angela Buchanan was seen for palpitations.  She wore an ambulatory monitor that revealed sinus rhythm with frequent PVCs.  She also had ventricular bigeminy and trigeminy.  She had a Lexiscan Myoview in 2015 that was negative for ischemia.  It was not gated due to PVCs. She was again seen 11/2019 for the evaluation of palpitations and CV risk assessment.  Her blood pressure is elevated so lisinopril was increased to 40mg .  Lipids were elevated so rosuvastatin was started 11/2019.  She developed severe muscle discomfort. She was asked to reduce the dose and take it every other day.  She wore a 30-day monitor that revealed some PACs and PVCs but was otherwise unremarkable.  She was started on metoprolol.  She had an echo 12/2019 that revealed normal EF and grade 1 diastolic dysfunction.  Carotid Dopplers were normal.  Since starting the metoprolol she has been feeling much better.  She followed up with our pharmacist and her BP was poorly controlled.  HCTZ was increased and metoprolol was reduced.    Angela Buchanan developed hyponatremia and poorly controlled blood pressure. Hydrochlorothiazide was discontinued and she was started on amlodipine.  Her BP was then labile ranging from 100/72 to 174/84.  She was under a lot of stress caring for her husband was stage IV cancer.  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  Sometimes the pain is so excruciating she wants to pull her fingers off.  She had an episode of chest pain and shortness of breath and was referred for [facility] 10/2020 that revealed LVEF 70% with a  fixed perfusion defect at the apex, apical anterior, lateral, and inferior walls not to be artifact.  There is no ischemia.  Metoprolol was increased.  At her last appointment she was not taking her HCTZ. She noted her blood pressure was controlled at home but not in her office.  She was switched to pravastatin due to myalgias on rosuvastatin. Today, she appears to be doing fine. She has been having problems with her B12 which recently measured 36. She has been experiencing joint pain, primarily in her L ankle, which she relates to her pravastatin. She discontinued her statin 1 month ago but still feels joint pain. From 10/16/202, she reports 3 episodes sharp L chest pain she describes as electrical shocks that occurred in rapid succession while she was cleaning. Occasionally, she also feels pressure in her L chest she describes as a tightness or a "balloon about to pop". During these episode, she experiences discomfort that she is unsure is related to shortness of breath or panic attacks. Amlodipine was stopped last visit when her sodium dropped. She tracks her blood pressure at home and records measurements of 450T-888K systolic. For exercise, she walks 2-3 times a day with her dog. She denies any palpitations, lightheadedness, headaches, syncope, orthopnea, PND,  or lower extremity edema.   Past Medical History:  Diagnosis Date   Atypical chest pain 07/19/2021   Bigeminy    no current med.   Breast  cancer ([place]) 06/2017   right   Bruises easily    Carotid bruit 11/13/2019   Dental crowns present    History of diverticulitis    Hypertension    states under control with med., has been on med. x 5 yr.   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    Past  Surgical History:  Procedure Laterality Date   ABDOMINAL HYSTERECTOMY     partial   BREAST LUMPECTOMY Right 06/22/2017   Malignant   BREAST LUMPECTOMY WITH RADIOACTIVE SEED AND SENTINEL LYMPH NODE BIOPSY Right 06/22/2017   Procedure: RIGHT BREAST LUMPECTOMY WITH RADIOACTIVE SEED AND RIGHT AXILLARY DEEP SENTINEL LYMPH NODE BIOPSY WITH BLUE DYE INJECTION;  Surgeon: Fanny Skates, MD;  Location: [place];  Service: General;  Laterality: Right;   BREAST LUMPECTOMY WITH RADIOACTIVE SEED LOCALIZATION Left 06/22/2017   Procedure: LEFT BREAST LUMPECTOMY WITH RADIOACTIVE SEED LOCALIZATION;  Surgeon: Fanny Skates, MD;  Location: [place];  Service: General;  Laterality: Left;   CATARACT EXTRACTION W/ INTRAOCULAR LENS  IMPLANT, BILATERAL Bilateral    EXCISION OF BREAST BIOPSY Left 06/22/2017   benign   TONSILLECTOMY     age 36     Current Outpatient Medications  Medication Sig Dispense Refill   ALPRAZolam (XANAX) 0.25 MG tablet 1 tablet     amLODipine (NORVASC) 5 MG tablet Take 1 tablet (5 mg total) by mouth daily. 90 tablet 3   anastrozole (ARIMIDEX) 1 MG tablet Take 1 tablet (1 mg total) by mouth daily. 90 tablet 3   aspirin EC 81 MG tablet Take 1 tablet (81 mg total) by mouth daily.     b complex vitamins tablet Take 1 tablet by mouth daily.     Biotin 1000 MCG tablet Take 1,000 mcg by mouth daily.     Calcium-Magnesium-Vitamin D 600-300-400 LIQD Take 1 tablet by mouth daily.     cholecalciferol (VITAMIN D) 1000 units tablet Take 2,000 Units by mouth daily.     citalopram (CELEXA) 10 MG tablet Take 10 mg by mouth daily as needed (anxiety).      diclofenac Sodium (VOLTAREN) 1 % GEL Apply 2 g topically 4 (four) times daily as needed. 50 g 1   fluticasone (FLONASE) 50 MCG/ACT nasal spray      ibuprofen (ADVIL) 800 MG tablet TAKE 1 TABLET BY MOUTH THREE TIMES DAILY WITH MEALS FOR 14 DAYS     levocetirizine (XYZAL) 5 MG tablet levocetirizine 5 mg tablet   TAKE 1 TABLET BY MOUTH IN THE EVENING     levothyroxine (SYNTHROID) 25 MCG tablet levothyroxine 25 mcg tablet  TAKE 1 TABLET BY MOUTH EVERY DAY     lisinopril (ZESTRIL) 40 MG tablet Take 1 tablet by mouth once daily 90 tablet 2   meloxicam (MOBIC) 15 MG tablet meloxicam 15 mg tablet     metoprolol succinate (TOPROL-XL) 25 MG 24 hr tablet Take 1 tablet (25 mg total) by mouth daily. 90 tablet 3   montelukast (SINGULAIR) 10 MG tablet Take 10 mg by mouth at bedtime.      Multiple Vitamin (MULTIVITAMIN WITH MINERALS) TABS tablet Take 1 tablet by mouth daily.     pravastatin (PRAVACHOL) 10 MG tablet Take 1 tablet (10 mg total) by mouth daily.  90 tablet 3   traMADol (ULTRAM) 50 MG tablet Take 0.5-1 tablets (25-50 mg total) by mouth every 6 (six) hours as needed. 30 tablet 0   nitroGLYCERIN (NITROSTAT) 0.4 MG SL tablet Place 1 tablet (0.4 mg total) under the tongue every 5 (five) minutes as needed for chest pain. 25 tablet PRN   No current facility-administered medications for this visit.    Allergies:   Atenolol, Bisoprolol, Prednisone, Amoxicillin-pot clavulanate, Hctz [hydrochlorothiazide], Rosuvastatin, and Latex    Social History:  The patient  reports that she has never smoked. She has never used smokeless tobacco. She reports that she does not drink alcohol and does not use drugs.   Family History:  The patient's family history includes Dementia in her brother and father; Heart attack in her brother; Hypertension in her sister; Stroke in her brother, maternal grandfather, maternal grandmother, mother, paternal grandfather, paternal grandmother, and sister.    ROS:  Please see the history of present illness.   (+) Arthralgia (+) Chest pain (+) Shortness of breath All other systems are reviewed and negative.    PHYSICAL EXAM: VS:  BP (!) 160/90 (BP Location: Left Arm, Patient Position: Sitting, Cuff Size: Normal)   Pulse (!) 52   Ht 5\' 2"  (1.575 m)   Wt 118 lb 6.4 oz (53.7 kg)   LMP   (LMP Unknown)   BMI 21.66 kg/m  , BMI Body mass index is 21.66 kg/m. GENERAL:  Well appearing HEENT: Pupils equal round and reactive, fundi not visualized, oral mucosa unremarkable NECK:  No jugular venous distention, waveform within normal limits, carotid upstroke brisk and symmetric, no bruits LUNGS:  Clear to auscultation bilaterally HEART:  RRR.  PMI not displaced or sustained,S1 and S2 within normal limits, no S3, no S4, no clicks, no rubs, no murmurs ABD:  Flat, positive bowel sounds normal in frequency in pitch, no bruits, no rebound, no guarding, no midline pulsatile mass, no hepatomegaly, no splenomegaly EXT:  2 plus pulses throughout, no edema, no cyanosis no clubbing SKIN:  No rashes no nodules NEURO:  Cranial nerves II through XII grossly intact, motor grossly intact throughout PSYCH:  Cognitively intact, oriented to person place and time  EKG:   11/14/19 sinus rhythm.  Rate 62 bpm. Nonspecific ST changes. 07/19/2021: Sinus bradycardia Rate 52 bpm   Echo 12/02/19:  1. Left ventricular ejection fraction, by estimation, is 55 to 60%. The  left ventricle has normal function. The left ventricle has no regional  wall motion abnormalities. Left ventricular diastolic parameters are  consistent with Grade I diastolic  dysfunction (impaired relaxation). The average left ventricular global  longitudinal strain is -20.4 %.   2. Right ventricular systolic function is normal. The right ventricular  size is normal.   3. The mitral valve is abnormal. Trivial mitral valve regurgitation.   4. The aortic valve is tricuspid. Aortic valve regurgitation is not  visualized.   5. The inferior vena cava is normal in size with greater than 50%  respiratory variability, suggesting right atrial pressure of 3 mmHg.   Carotid Doppler 12/03/19: Normal  30 Day Event Monitor 01/2020:   Quality: Fair.  Baseline artifact. Predominant rhythm: sinus rhythm Average heart rate: 69 bpm Max heart rate: 131  bpm Min heart rate: 47 bpm Pauses >2.5 seconds: none   Patient did submit a symptom diary.  She reported multiple episodes of chest pain and fluttering.  At which time most of the time sinus rhythm with noted.  She did have  occasional PACs or PVCs. 4 beats of atrial tachycardia.  Ambulatory monitor 08/17/16: NSR with PVCs, trigeminy, bigeminy intermittently. No AFib or pathologic tachyarrhythmias noted.   Lexiscan Myoview 07/30/14:  Scattered PVCs.  No ischemia.  Not gated due to frequent ectopy.  Lexiscan Myoview 10/2020: There was no significant ST segment deviation noted during stress. <32mm ST depression in V3/4 The left ventricular ejection fraction is hyperdynamic (>65%). Nuclear stress EF: 70%. Defect 1: There is a small defect of moderate severity present in the apical anterior, apical inferior, apical lateral and apex location. The study is normal. This is a low risk study.   1. Moderate fixed perfusion defect at apex and apical anterior/lateral/inferior walls, with normal wall motion in this area, suggestive of artifact 2. Low risk study   Recent Labs: No results found for requested labs within last 8760 hours.    Lipid Panel    Component Value Date/Time   CHOL 174 01/14/2020 1535   TRIG 160 (H) 01/14/2020 1535   HDL 75 01/14/2020 1535   CHOLHDL 2.3 01/14/2020 1535   LDLCALC 72 01/14/2020 1535      Wt Readings from Last 3 Encounters:  07/19/21 118 lb 6.4 oz (53.7 kg)  12/02/20 115 lb 12.8 oz (52.5 kg)  10/05/20 113 lb (51.3 kg)      ASSESSMENT AND PLAN: Pure hypercholesterolemia She is tolerating pravastatin better than rosuvastatin.  She did have some joint discomfort but it didn't improve when she stopped pravastatin, so she will restart it.    Essential hypertension Blood pressure elevated both here and at home.  She developed hyponatremia on hydrochlorothiazide.  Add amlodipine 5 mg daily.  Continue lisinopril and metoprolol.  PAC (premature atrial  contraction) Stable on metoprolol.  Atypical chest pain Patient is atypical.  However her nuclear stress test previously was mildly abnormal with evidence of prior infarct versus artifact.  We will get a coronary CTA to ensure that she does not have obstructive coronary disease.  This would also inform her lipid goals.   Current medicines are reviewed at length with the patient today.  The patient does not have concerns regarding medicines.  The following changes have been made: Add amlodipine 5mg   Labs/ tests ordered today include:   Orders Placed This Encounter  Procedures   CT CORONARY MORPH W/CTA COR W/SCORE W/CA W/CM &/OR WO/CM   Basic metabolic panel   EKG 78-MVEH      Disposition:   FU with Shari Natt C. Oval Linsey, MD, [facility] in 6 months.    I,Mykaella Javier,acting as a scribe for Skeet Latch, MD.,have documented all relevant documentation on the behalf of Skeet Latch, MD,as directed by  Skeet Latch, MD while in the presence of Skeet Latch, MD.  I, [place] Oval Linsey, MD have reviewed all documentation for this visit.  The documentation of the exam, diagnosis, procedures, and orders on 07/19/2021 are all accurate and complete.   Signed, Gleason Ardoin C. Oval Linsey, MD, [facility]  07/19/2021 6:49 PM    [place]

## 2021-07-20 ENCOUNTER — Encounter (HOSPITAL_BASED_OUTPATIENT_CLINIC_OR_DEPARTMENT_OTHER): Payer: Self-pay

## 2021-07-27 DIAGNOSIS — I10 Essential (primary) hypertension: Secondary | ICD-10-CM | POA: Diagnosis not present

## 2021-07-27 DIAGNOSIS — B070 Plantar wart: Secondary | ICD-10-CM | POA: Diagnosis not present

## 2021-07-29 ENCOUNTER — Telehealth (HOSPITAL_COMMUNITY): Payer: Self-pay | Admitting: Emergency Medicine

## 2021-07-29 ENCOUNTER — Telehealth (HOSPITAL_COMMUNITY): Payer: Self-pay | Admitting: *Deleted

## 2021-07-29 NOTE — Telephone Encounter (Signed)
Patient returning call regarding upcoming cardiac imaging study; pt verbalizes understanding of appt date/time, parking situation and where to check in, pre-test NPO status and medications ordered, and verified current allergies; name and call back number provided for further questions should they arise  Delecia Vastine RN Navigator Cardiac Imaging [facility] [phone] office [phone] cell   

## 2021-07-29 NOTE — Telephone Encounter (Signed)
Attempted to call patient regarding upcoming cardiac CT appointment. Left message on voicemail with name and callback number Angela Bond RN [facility] [phone] Office [phone] Cell  Reminded to get lab work!

## 2021-08-01 ENCOUNTER — Telehealth (HOSPITAL_COMMUNITY): Payer: Self-pay | Admitting: Emergency Medicine

## 2021-08-01 ENCOUNTER — Telehealth (HOSPITAL_BASED_OUTPATIENT_CLINIC_OR_DEPARTMENT_OTHER): Payer: Self-pay | Admitting: Cardiovascular Disease

## 2021-08-01 DIAGNOSIS — I10 Essential (primary) hypertension: Secondary | ICD-10-CM | POA: Diagnosis not present

## 2021-08-01 DIAGNOSIS — E7800 Pure hypercholesterolemia, unspecified: Secondary | ICD-10-CM | POA: Diagnosis not present

## 2021-08-01 DIAGNOSIS — I491 Atrial premature depolarization: Secondary | ICD-10-CM | POA: Diagnosis not present

## 2021-08-01 DIAGNOSIS — R0789 Other chest pain: Secondary | ICD-10-CM | POA: Diagnosis not present

## 2021-08-01 NOTE — Telephone Encounter (Signed)
Advised pt she is ok to have labs drawn today prior to CT scan.

## 2021-08-01 NOTE — Telephone Encounter (Signed)
New Message:     Patient is scheduled for a CT tomorrow. She needs lab work, if she have lab work today, will that be in time for tomorrow?

## 2021-08-01 NOTE — Telephone Encounter (Signed)
Pt questioning whether her labs will result in time for CCTA. I assured her they will and she was relieved.   Marchia Bond RN Navigator Cardiac Imaging [facility] [phone] Office  [phone] Cell

## 2021-08-02 ENCOUNTER — Ambulatory Visit (HOSPITAL_COMMUNITY)
Admission: RE | Admit: 2021-08-02 | Discharge: 2021-08-02 | Disposition: A | Discharge status: Home / Self Care | Payer: Medicare PPO | Source: Ambulatory Visit | Attending: Cardiovascular Disease | Admitting: Cardiovascular Disease

## 2021-08-02 ENCOUNTER — Other Ambulatory Visit: Payer: Self-pay

## 2021-08-02 DIAGNOSIS — I700 Atherosclerosis of aorta: Secondary | ICD-10-CM | POA: Insufficient documentation

## 2021-08-02 DIAGNOSIS — I2510 Atherosclerotic heart disease of native coronary artery without angina pectoris: Secondary | ICD-10-CM | POA: Insufficient documentation

## 2021-08-02 DIAGNOSIS — E7800 Pure hypercholesterolemia, unspecified: Secondary | ICD-10-CM | POA: Insufficient documentation

## 2021-08-02 DIAGNOSIS — I10 Essential (primary) hypertension: Secondary | ICD-10-CM | POA: Diagnosis not present

## 2021-08-02 DIAGNOSIS — I491 Atrial premature depolarization: Secondary | ICD-10-CM | POA: Insufficient documentation

## 2021-08-02 DIAGNOSIS — R0789 Other chest pain: Secondary | ICD-10-CM | POA: Diagnosis not present

## 2021-08-02 LAB — BASIC METABOLIC PANEL
BUN/Creatinine Ratio: 16 (ref 12–28)
BUN: 12 mg/dL (ref 8–27)
CO2: 25 mmol/L (ref 20–29)
Calcium: 9.6 mg/dL (ref 8.7–10.3)
Chloride: 100 mmol/L (ref 96–106)
Creatinine, Ser: 0.73 mg/dL (ref 0.57–1.00)
Glucose: 90 mg/dL (ref 70–99)
Potassium: 4.8 mmol/L (ref 3.5–5.2)
Sodium: 138 mmol/L (ref 134–144)
eGFR: 88 mL/min/{1.73_m2} (ref >59)

## 2021-08-02 MED ORDER — IOHEXOL 350 MG/ML SOLN
100.0000 mL | Freq: Once | INTRAVENOUS | Status: AC | PRN
  Administered 2021-08-02: 100 mL via INTRAVENOUS

## 2021-08-02 MED ORDER — NITROGLYCERIN 0.4 MG SL SUBL
SUBLINGUAL_TABLET | SUBLINGUAL | Status: AC
  Filled 2021-08-02: qty 2

## 2021-08-02 MED ORDER — NITROGLYCERIN 0.4 MG SL SUBL
0.8000 mg | SUBLINGUAL_TABLET | Freq: Once | SUBLINGUAL | Status: AC
  Administered 2021-08-02: 0.8 mg via SUBLINGUAL

## 2021-08-05 ENCOUNTER — Telehealth (HOSPITAL_BASED_OUTPATIENT_CLINIC_OR_DEPARTMENT_OTHER): Payer: Self-pay | Admitting: Cardiovascular Disease

## 2021-08-05 NOTE — Telephone Encounter (Signed)
Patient was calling to get results from the CT scan she had 08/02/21

## 2021-08-05 NOTE — Telephone Encounter (Signed)
Advised patient of lab results but advised Dr Oval Linsey has not reviewed, will call when she does

## 2021-08-11 NOTE — Telephone Encounter (Signed)
  Pt is calling back to follow up, she asked if she can also get a copy of the CT result

## 2021-08-17 ENCOUNTER — Telehealth (HOSPITAL_BASED_OUTPATIENT_CLINIC_OR_DEPARTMENT_OTHER): Payer: Self-pay | Admitting: Cardiovascular Disease

## 2021-08-17 DIAGNOSIS — E538 Deficiency of other specified B group vitamins: Secondary | ICD-10-CM | POA: Diagnosis not present

## 2021-08-17 NOTE — Telephone Encounter (Signed)
Patient came into  office regarding Heart CT results . Patient states she called a few times . Patient would like to get results in person today . I reached out to Cma Naya to help  with matter.

## 2021-08-23 NOTE — Telephone Encounter (Signed)
Earvin Hansen, LPN  31/25/0871  9:94 PM EST     Advised patient of results

## 2021-08-31 DIAGNOSIS — Z961 Presence of intraocular lens: Secondary | ICD-10-CM | POA: Diagnosis not present

## 2021-08-31 DIAGNOSIS — H532 Diplopia: Secondary | ICD-10-CM | POA: Diagnosis not present

## 2021-08-31 DIAGNOSIS — H26493 Other secondary cataract, bilateral: Secondary | ICD-10-CM | POA: Diagnosis not present

## 2021-09-06 DIAGNOSIS — Z23 Encounter for immunization: Secondary | ICD-10-CM | POA: Diagnosis not present

## 2021-09-16 DIAGNOSIS — E538 Deficiency of other specified B group vitamins: Secondary | ICD-10-CM | POA: Diagnosis not present

## 2021-09-20 ENCOUNTER — Ambulatory Visit (HOSPITAL_BASED_OUTPATIENT_CLINIC_OR_DEPARTMENT_OTHER): Payer: Medicare PPO | Admitting: Family

## 2021-10-05 DIAGNOSIS — I10 Essential (primary) hypertension: Secondary | ICD-10-CM | POA: Diagnosis not present

## 2021-10-05 DIAGNOSIS — F411 Generalized anxiety disorder: Secondary | ICD-10-CM | POA: Diagnosis not present

## 2021-10-05 DIAGNOSIS — T148XXA Other injury of unspecified body region, initial encounter: Secondary | ICD-10-CM | POA: Diagnosis not present

## 2021-10-06 ENCOUNTER — Other Ambulatory Visit: Payer: Self-pay | Admitting: Adult Health

## 2021-10-06 DIAGNOSIS — Z853 Personal history of malignant neoplasm of breast: Secondary | ICD-10-CM

## 2021-10-07 ENCOUNTER — Other Ambulatory Visit: Payer: Self-pay | Admitting: Cardiovascular Disease

## 2021-10-21 DIAGNOSIS — E538 Deficiency of other specified B group vitamins: Secondary | ICD-10-CM | POA: Diagnosis not present

## 2021-11-03 ENCOUNTER — Other Ambulatory Visit: Payer: Self-pay | Admitting: Cardiovascular Disease

## 2021-11-03 NOTE — Telephone Encounter (Signed)
Rx(s) sent to pharmacy electronically.  

## 2021-11-09 DIAGNOSIS — Z9889 Other specified postprocedural states: Secondary | ICD-10-CM | POA: Diagnosis not present

## 2021-11-09 DIAGNOSIS — Z961 Presence of intraocular lens: Secondary | ICD-10-CM | POA: Diagnosis not present

## 2021-11-09 DIAGNOSIS — H532 Diplopia: Secondary | ICD-10-CM | POA: Diagnosis not present

## 2021-11-09 DIAGNOSIS — H5000 Unspecified esotropia: Secondary | ICD-10-CM | POA: Diagnosis not present

## 2021-11-09 DIAGNOSIS — H26491 Other secondary cataract, right eye: Secondary | ICD-10-CM | POA: Diagnosis not present

## 2021-11-16 DIAGNOSIS — E538 Deficiency of other specified B group vitamins: Secondary | ICD-10-CM | POA: Diagnosis not present

## 2021-12-02 ENCOUNTER — Telehealth (HOSPITAL_BASED_OUTPATIENT_CLINIC_OR_DEPARTMENT_OTHER): Payer: Self-pay | Admitting: Cardiovascular Disease

## 2021-12-02 MED ORDER — LISINOPRIL 40 MG PO TABS
40.0000 mg | ORAL_TABLET | Freq: Every day | ORAL | 0 refills | Status: DC
Start: 2021-12-02 — End: 2021-12-30

## 2021-12-02 NOTE — Telephone Encounter (Signed)
Rx(s) sent to pharmacy electronically.  

## 2021-12-02 NOTE — Telephone Encounter (Signed)
?*  STAT* If patient is at the pharmacy, call can be transferred to refill team. ? ? ?1. Which medications need to be refilled? (please list name of each medication and dose if known) Lisinopril ? ?2. Which pharmacy/location (including street and city if local pharmacy) is medication to be sent to?[place] [facility] [address] [facility] [place] ? ? ?3. Do they need a 30 day or 90 day supply? Enough until her appointment  on 12-30-21- please call today, completely out of it ? ?

## 2021-12-30 ENCOUNTER — Encounter (HOSPITAL_BASED_OUTPATIENT_CLINIC_OR_DEPARTMENT_OTHER): Payer: Self-pay | Admitting: Family

## 2021-12-30 ENCOUNTER — Ambulatory Visit (HOSPITAL_BASED_OUTPATIENT_CLINIC_OR_DEPARTMENT_OTHER): Payer: Medicare PPO | Admitting: Family

## 2021-12-30 VITALS — BP 130/74 | HR 61 | Ht 62.0 in | Wt 121.6 lb

## 2021-12-30 DIAGNOSIS — I10 Essential (primary) hypertension: Secondary | ICD-10-CM | POA: Diagnosis not present

## 2021-12-30 DIAGNOSIS — I25118 Atherosclerotic heart disease of native coronary artery with other forms of angina pectoris: Secondary | ICD-10-CM

## 2021-12-30 DIAGNOSIS — E785 Hyperlipidemia, unspecified: Secondary | ICD-10-CM

## 2021-12-30 MED ORDER — AMLODIPINE BESYLATE 5 MG PO TABS: 5.0000 mg | ORAL_TABLET | Freq: Every day | ORAL | 3 refills | Status: DC

## 2021-12-30 MED ORDER — METOPROLOL SUCCINATE ER 25 MG PO TB24: 25.0000 mg | ORAL_TABLET | Freq: Every day | ORAL | 3 refills | Status: DC

## 2021-12-30 MED ORDER — LISINOPRIL 40 MG PO TABS: 40.0000 mg | ORAL_TABLET | Freq: Every day | ORAL | 3 refills | Status: DC

## 2021-12-30 MED ORDER — PRAVASTATIN SODIUM 10 MG PO TABS: 10.0000 mg | ORAL_TABLET | Freq: Every day | ORAL | 3 refills | Status: DC

## 2021-12-30 NOTE — Patient Instructions (Addendum)
Medication Instructions:  ?Continue your current medications.  ? ?*If you need a refill on your cardiac medications before your next appointment, please call your pharmacy* ? ? ?Lab Work: ?Your physician recommends that you return for lab work in 1-2 weeks for fasting lipid panel and CMET. ? ?If you have labs (blood work) drawn today and your tests are completely normal, you will receive your results only by: ?MyChart Message (if you have MyChart) OR ?A paper copy in the mail ?If you have any lab test that is abnormal or we need to change your treatment, we will call you to review the results. ? ? ?Testing/Procedures: ?Your EKG today showed normal sinus rhythm which is a good result! ? ? ?Follow-Up: ?At [facility], you and your health needs are our priority.  As part of our continuing mission to provide you with exceptional heart care, we have created designated Provider Care Teams.  These Care Teams include your primary Cardiologist (physician) and Advanced Practice Providers (APPs -  Physician Assistants and Nurse Practitioners) who all work together to provide you with the care you need, when you need it. ? ?We recommend signing up for the patient portal called "MyChart".  Sign up information is provided on this After Visit Summary.  MyChart is used to connect with patients for Virtual Visits (Telemedicine).  Patients are able to view lab/test results, encounter notes, upcoming appointments, etc.  Non-urgent messages can be sent to your provider as well.   ?To learn more about what you can do with MyChart, go to [email or web address].   ? ?Your next appointment:   ?6 month(s) ? ?The format for your next appointment:   ?In Person ? ?Provider:   ?Skeet Latch, MD or Loel Dubonnet, NP   ? ? ?Other Instructions ? ?Heart Healthy Diet Recommendations: ?A low-salt diet is recommended. Meats should be grilled, baked, or boiled. Avoid fried foods. Focus on lean protein sources like fish or chicken with  vegetables and fruits. The American Heart Association is a Microbiologist!  American Heart Association Diet and Lifeystyle Recommendations   ? ?Exercise recommendations: ?The American Heart Association recommends 150 minutes of moderate intensity exercise weekly. ?Try 30 minutes of moderate intensity exercise 4-5 times per week. ?This could include walking, jogging, or swimming. ?   ?

## 2021-12-30 NOTE — Progress Notes (Signed)
? ?Office Visit  ?  ?Patient Name: Angela Buchanan ?Date of Encounter: 01/01/2022 ? ?PCP:  Pcp, No ?  ?[place]  ?Cardiologist:  Skeet Latch, MD  ?Advanced Practice Provider:  No care team member to display ?Electrophysiologist:  None  ?   ? ?Chief Complaint  ?  ?Angela Buchanan is a 71 y.o. female with a hx of celiac, CAD, breast cancer s/p lumpectomy and XRT, R subclavian DVT, HTN, palpitations, PAC/PVC, HLD presents today for hypertension follow up  ? ?Past Medical History  ?  ?Past Medical History:  ?Diagnosis Date  ? Atypical chest pain 07/19/2021  ? Bigeminy   ? no current med.  ? Breast cancer ([place]) 06/2017  ? right  ? Bruises easily   ? Carotid bruit 11/13/2019  ? Dental crowns present   ? History of diverticulitis   ? Hypertension   ? states under control with med., has been on med. x 5 yr.  ? PAC (premature atrial contraction) 05/11/2020  ? Personal history of radiation therapy   ? 2018  ? Pure hypercholesterolemia 11/13/2019  ? PVC (premature ventricular contraction) 05/11/2020  ? 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 Seasonal allergies   ? 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  ? ?Past Surgical History:  ?Procedure Laterality Date  ? ABDOMINAL HYSTERECTOMY    ? partial  ? BREAST LUMPECTOMY Right 06/22/2017  ? Malignant  ? BREAST LUMPECTOMY WITH RADIOACTIVE SEED AND SENTINEL LYMPH NODE BIOPSY Right 06/22/2017  ? Procedure: RIGHT BREAST LUMPECTOMY WITH RADIOACTIVE SEED AND RIGHT AXILLARY DEEP SENTINEL LYMPH NODE BIOPSY WITH BLUE DYE INJECTION;  Surgeon: Fanny Skates, MD;  Location: [facility];  Service: General;  Laterality: Right;  ? BREAST LUMPECTOMY WITH RADIOACTIVE SEED LOCALIZATION Left 06/22/2017  ? Procedure: LEFT BREAST LUMPECTOMY WITH RADIOACTIVE SEED LOCALIZATION;  Surgeon: Fanny Skates, MD;  Location: [place];  Service: General;  Laterality: Left;  ? CATARACT EXTRACTION W/ INTRAOCULAR LENS  IMPLANT, BILATERAL Bilateral   ? EXCISION  OF BREAST BIOPSY Left 06/22/2017  ? benign  ? TONSILLECTOMY    ? age 30  ? ? ?Allergies ? ?Allergies  ?Allergen Reactions  ? Atenolol Other (See Comments)  ?  CHEST PAIN  ? Bisoprolol Other (See Comments)  ?  "FEELS WEIRD"  ? Prednisone Other (See Comments)  ?  UNABLE TO FOCUS  ? Amoxicillin-Pot Clavulanate Diarrhea and Other (See Comments)  ? Hctz [Hydrochlorothiazide]   ?  Hyponatremia ?  ? Rosuvastatin   ?  myalgias  ? Latex Rash  ? ? ?History of Present Illness  ?  ?Angela Buchanan is a 71 y.o. female with a hx of celiac, breast cancer s/p lumpectomy and XRT, R subclavian DVT, HTN, palpitations, PAC/PVC, HLD, CAD last seen 07/19/2021 by Dr. Oval Linsey. ? ?Prior monitor 2017 for palpitations revealed frequent PVCs. Myoview 2015 with no ischemia. Seen 11/2019 for palpitations and CV risk assessment. Lisinopril increased for hypertension and Rosuvastatin initiated. She developed myalgias and reduced to every other day. Repeat 30 day monitor with some PAC and PVC which were treated by Metoprolol. Echo 12/2019 normal LVEF, gr1DD and normal carotid dopplers. Palpitations much improved on Metoprolol. Due to need for BP control, pharmacy team increased HCTZ and reduced Metoprolol. HCTZ then transitioned to Amlodipine due to hyponatremia. Myoview 10/2020 with LVEF 70%, fixed perfusion defect at apex, apical anterior, lateral and inferior walls to felt to be artifact with no ischemia. She had been transitioned from Rosuvastatin to  Pravasatatin due to myalgias. When last seen 07/2021 she was recommended for cardiac CTA due to reports of chest pain which showed coronary calcium score of 14.4 with minimal nonobstructive coronary disease.  ? 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 Doing well form a cardiac perspective. We reviewed her cardiac CTA and she is reassured by the result. Reports no shortness of breath nor dyspnea on exertion.  Reports no chest pain, pressure, or tightness. No edema, orthopnea, PND. Reports no palpitations.  ? ?EKGs/Labs/Other Studies Reviewed:  ? ?The following studies were reviewed today: ? ?Cardiac CTA 08/2021 ?IMPRESSION: ?1. Coronary calcium score of 14.4. This was 26 percentile for age-, ?sex, and race-matched controls. ?  ?2. 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 ?  ?3. Minimal plaque in the PDA. ?  ?4. Mild aortic atherosclerosis. ?  ?RECOMMENDATIONS: ?CAD-RADS 1: Minimal non-obstructive CAD (0-24%). Consider ?non-atherosclerotic causes of chest pain. Consider preventive ?therapy and risk factor modification. ?  ? ?EKG:  EKG is ordered today.  The ekg ordered today demonstrates NSR 61 bpm with no acute St/T wave changes.  ? ?Recent Labs: ?08/01/2021: BUN 12; Creatinine, Ser 0.73; Potassium 4.8; Sodium 138  ?Recent Lipid Panel ?   ?Component Value Date/Time  ? CHOL 174 01/14/2020 1535  ? TRIG 160 (H) 01/14/2020 1535  ? HDL 75 01/14/2020 1535  ? CHOLHDL 2.3 01/14/2020 1535  ? [place] 72 01/14/2020 1535  ? ? ?Home Medications  ? ?Current Meds  ?Medication Sig  ? ALPRAZolam (XANAX) 0.25 MG tablet 1 tablet  ? anastrozole (ARIMIDEX) 1 MG tablet Take 1 tablet (1 mg total) by mouth daily.  ? aspirin EC 81 MG tablet Take 1 tablet (81 mg total) by mouth daily.  ? b complex vitamins tablet Take 1 tablet by mouth daily.  ? Biotin 1000 MCG tablet Take 1,000 mcg by mouth daily.  ? Calcium-Magnesium-Vitamin D 600-300-400 LIQD Take 1 tablet by mouth daily.  ? cholecalciferol (VITAMIN D) 1000 units tablet Take 2,000 Units by mouth daily.  ? citalopram (CELEXA) 20 MG tablet Take 1 tablet by mouth daily at 12 noon.  ? Cyanocobalamin (VITAMIN B12) 1000 MCG TBCR Take 1 tablet by mouth daily at 12 noon.  ? diclofenac Sodium (VOLTAREN) 1 % GEL Apply 2 g topically 4 (four) times daily as needed.   ? fluticasone (FLONASE) 50 MCG/ACT nasal spray   ? ibuprofen (ADVIL) 800 MG tablet TAKE 1 TABLET BY MOUTH THREE TIMES DAILY WITH MEALS FOR 14 DAYS  ? levocetirizine (XYZAL) 5 MG tablet levocetirizine 5 mg tablet ? TAKE 1 TABLET BY MOUTH IN THE EVENING  ? levothyroxine (SYNTHROID) 25 MCG tablet levothyroxine 25 mcg tablet ? TAKE 1 TABLET BY MOUTH EVERY DAY  ? meloxicam (MOBIC) 15 MG tablet meloxicam 15 mg tablet  ? montelukast (SINGULAIR) 10 MG tablet Take 10 mg by mouth at bedtime.   ? Multiple Vitamin (MULTIVITAMIN WITH MINERALS) TABS tablet Take 1 tablet by mouth daily.  ? traMADol (ULTRAM) 50 MG tablet Take 0.5-1 tablets (25-50 mg total) by mouth every 6 (six) hours as needed.  ? [DISCONTINUED] amLODipine (NORVASC) 5 MG tablet Take 1 tablet (5 mg total) by mouth daily.  ? [DISCONTINUED] lisinopril (ZESTRIL) 40 MG tablet Take 1 tablet (40 mg total) by mouth daily. Patient is overdue for follow  up. Please call the office to schedule an appointment to ensure further refills attempt #1  ? [DISCONTINUED] metoprolol succinate (TOPROL-XL) 25 MG 24 hr tablet Take 1 tablet by mouth once daily  ? [DISCONTINUED] pravastatin (PRAVACHOL) 10 MG tablet Take 1 tablet (10 mg total) by mouth daily.  ?  ? ?Review of Systems  ?    ?All other systems reviewed and are otherwise negative except as noted above. ? ?Physical Exam  ?  ?VS:  BP 130/74 (BP Location: Left Arm, Patient Position: Sitting, Cuff Size: Normal)   Pulse 61   Ht '5\' 2"'$  (1.575 m)   Wt 121 lb 9.6 oz (55.2 kg)   LMP  (LMP Unknown)   BMI 22.24 kg/m?  , BMI Body mass index is 22.24 kg/m?. ? ?Wt Readings from Last 3 Encounters:  ?12/30/21 121 lb 9.6 oz (55.2 kg)  ?07/19/21 118 lb 6.4 oz (53.7 kg)  ?12/02/20 115 lb 12.8 oz (52.5 kg)  ?  ? ?GEN: Well nourished, well developed, in no acute distress. ?HEENT: normal. ?Neck: Supple, no JVD, carotid bruits, or masses. ?Cardiac: RRR, no murmurs, rubs, or gallops. No clubbing, cyanosis, edema.  Radials/PT 2+ and equal  bilaterally.  ?Respiratory:  Respirations regular and unlabored, clear to auscultation bilaterally. ?GI: Soft, nontender, nondistended. ?MS: No deformity or atrophy. ?Skin: Warm and dry, no rash. ?Neuro:  Strength and

## 2022-01-02 NOTE — Addendum Note (Signed)
Addended by: Georgana Curio D on: 01/02/2022 10:09 AM ? ? Modules accepted: Orders ? ?

## 2022-01-04 ENCOUNTER — Inpatient Hospital Stay: Payer: Medicare PPO | Attending: Adult Health | Admitting: Adult Health

## 2022-01-04 ENCOUNTER — Other Ambulatory Visit: Payer: Self-pay

## 2022-01-04 VITALS — BP 139/67 | HR 58 | Temp 98.1°F | Resp 16 | Ht 62.0 in | Wt 120.5 lb

## 2022-01-04 DIAGNOSIS — Z90710 Acquired absence of both cervix and uterus: Secondary | ICD-10-CM | POA: Insufficient documentation

## 2022-01-04 DIAGNOSIS — E2839 Other primary ovarian failure: Secondary | ICD-10-CM | POA: Diagnosis not present

## 2022-01-04 DIAGNOSIS — Z923 Personal history of irradiation: Secondary | ICD-10-CM | POA: Insufficient documentation

## 2022-01-04 DIAGNOSIS — I10 Essential (primary) hypertension: Secondary | ICD-10-CM | POA: Diagnosis not present

## 2022-01-04 DIAGNOSIS — C50411 Malignant neoplasm of upper-outer quadrant of right female breast: Secondary | ICD-10-CM | POA: Diagnosis not present

## 2022-01-04 DIAGNOSIS — Z170 Estrogen receptor positive status [ER+]: Secondary | ICD-10-CM | POA: Insufficient documentation

## 2022-01-04 MED ORDER — ANASTROZOLE 1 MG PO TABS: 1.0000 mg | ORAL_TABLET | Freq: Every day | ORAL | 3 refills | Status: DC

## 2022-01-04 NOTE — Assessment & Plan Note (Signed)
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 cm, 1/3 micrometastatic disease in lymph nodes ; Left lumpectomy: Complex sclerosing lesion; grade 2, ER +, PR 50%, HER-2 negative ratio 1.26, Ki-67 15%, T1 cN1 mic M0 stage IA AJCC 8 ?? ?Bone scan 05/16/2018: No evidence of bone metastases?? ?Oncotype DX score 27: Intermediate risk, ROR 12% with tamoxifen alone and 8% with tamoxifen with chemo ? ?Carotid and RUE doppler notes right subclavian DVT started Xarelto in 11/2017? ?1.??Mammogram 05/05/2020: No evidence of malignancy ?2. Ultrasound right upper extremity 11/18/2018: No evidence of DVT in the upper extremity: We will discontinue Eliquis in 2021. ?? ?Adjuvant radiation therapy completed 09/27/2017 ? ?Current treatment: Adjuvant letrozole 2.5 mg daily (stopped 12/20/17 due to arthralgias) changed to Anastrozole ? ?Angela Buchanan is doing well today.  She does have new right upper outer breast pain.  She missed her appointment last year and has not been taking anastrozole for about 6 months.  We will get a diagnostic mammogram and breast ultrasound to evaluate this new onset of breast pain and tenderness along with the right upper outer quadrant change in breast density. ? ?I recent in Angela Buchanan's anastrozole for her to restart.  I also placed orders for bone density testing.  Her next bilateral diagnostic mammogram is due in August 2023.  I placed orders for this today as well.  And I will see her back in the office in September 2023 for her follow-up. ?

## 2022-01-04 NOTE — Progress Notes (Signed)
[facility] Follow up: ?  ? ?Pcp, No ?No address on file ? ? ?DIAGNOSIS:  Cancer Staging  ?Malignant neoplasm of upper-outer quadrant of right breast in female, estrogen receptor positive ([place]) ?Staging form: Breast, AJCC 8th Edition ?- Clinical stage from 05/23/2017: Stage IA (cT1c, cN0, cM0, G2, ER+, PR+, HER2-) - Unsigned ?Histologic grading system: 3 grade system ?Laterality: Right ?Staged by: Pathologist and managing physician ?Stage used in treatment planning: Yes ?National guidelines used in treatment planning: Yes ?Type of national guideline used in treatment planning: NCCN ?- Pathologic: Stage IA (pT1c, pN61m, cM0, G2, ER+, PR+, HER2-) - Signed by CGardenia Phlegm NP on 07/04/2017 ?Histologic grading system: 3 grade system ? ? ?SUMMARY OF ONCOLOGIC HISTORY: ?Oncology History  ?Malignant neoplasm of upper-outer quadrant of right breast in female, estrogen receptor positive ([place]  ?05/16/2017 Initial Diagnosis  ? Left breast biopsy UIQ: Complex sclerosing lesion with calcifications and sclerosing adenosis, right breast biopsy 10:00: IDC grade 2, DCIS, ER/PR positive, HER-2 negative ratio 1.26,  11 mm lesion in the right breast at 10:00 position: T1b N0 stage IA clinical stage  ?  ?06/22/2017 Surgery  ? 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 cm, 1/3 micrometastatic disease in lymph nodes ; Left lumpectomy: Complex sclerosing lesion; grade 2, ER +, PR 50%, HER-2 negative ratio 1.26, Ki-67 15%, T1 cN1 mic M0 stage IA AJCC 8 ? ?  ?08/07/2017 Oncotype testing  ? Oncotype DX score 27: Intermediate risk, 10 year risk of distant recurrence with tamoxifen alone 12% and chemotherapy plus tamoxifen 8% ? ? ?  ?08/09/2017 - 09/27/2017 Radiation Therapy  ? Adjuvant radiation therapy ?  ?10/2017 -  Anti-estrogen oral therapy  ? Letrozole daily, couldn't tolerate due to arthralgias, changed to Anastrozole in 11/2017 ?  ? ? ?CURRENT THERAPY: Anastrozole ? ?INTERVAL HISTORY: ?Angela ESTRADA71y.o. female  returns for follow-up of her estrogen positive breast cancer.  She has a new right breast pain and she is concerned about this and would like to be seen.  She is doing moderately well other than this right breast pain.  She says its been going on for 2 weeks and there is nothing she can remember that she did differently.  Her most recent mammogram was completed on May 10, 2021 and showed no mammographic evidence of malignancy and breast density category B. ? ?She tells me that she ran out of her anastrozole and opted to forego taking it.  She is agreeable to start taking it again if sent in. ? ? ? ? ?Patient Active Problem List  ? Diagnosis Date Noted  ? Atypical chest pain 07/19/2021  ? PAC (premature atrial contraction) 05/11/2020  ? PVC (premature ventricular contraction) 05/11/2020  ? 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  ? Carotid bruit 11/13/2019  ? Pure hypercholesterolemia 11/13/2019  ? Malignant neoplasm of upper-outer quadrant of right breast in female, estrogen receptor positive ([place] 05/22/2017  ? Diplopia 09/17/2014  ? Dizziness and giddiness 09/17/2014  ? Essential hypertension 07/22/2014  ? Chest pain 07/22/2014  ? Family history of heart disease 07/22/2014  ? ? ?is allergic to atenolol, bisoprolol, prednisone, amoxicillin-pot clavulanate, hctz [hydrochlorothiazide], rosuvastatin, and latex. ? ?MEDICAL HISTORY: ?Past Medical History:  ?Diagnosis Date  ? Atypical chest pain 07/19/2021  ? Bigeminy   ? no current med.  ? [place]  ? [place]  ? Bruises easily   ? Carotid bruit 11/13/2019  ? Dental crowns present   ? History of diverticulitis   ?  Hypertension   ? states under control with med., has been on med. x 5 yr.  ? PAC (premature atrial contraction) 05/11/2020  ? Personal history of radiation therapy   ? 2018  ? Pure hypercholesterolemia 11/13/2019  ? PVC (premature ventricular contraction) 05/11/2020  ? 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 Seasonal allergies   ? Ventricular  bigeminy 11/13/2019  ? ? ?SURGICAL HISTORY: ?Past Surgical History:  ?Procedure Laterality Date  ? ABDOMINAL HYSTERECTOMY    ? partial  ? BREAST LUMPECTOMY Right 06/22/2017  ? Malignant  ? BREAST LUMPECTOMY WITH RADIOACTIVE SEED AND SENTINEL LYMPH NODE BIOPSY Right 06/22/2017  ? Procedure: RIGHT BREAST LUMPECTOMY WITH RADIOACTIVE SEED AND RIGHT AXILLARY DEEP SENTINEL LYMPH NODE BIOPSY WITH BLUE DYE INJECTION;  Surgeon: Fanny Skates, MD;  Location: [place];  Service: General;  Laterality: Right;  ? BREAST LUMPECTOMY WITH RADIOACTIVE SEED LOCALIZATION Left 06/22/2017  ? Procedure: LEFT BREAST LUMPECTOMY WITH RADIOACTIVE SEED LOCALIZATION;  Surgeon: Fanny Skates, MD;  Location: [place];  Service: General;  Laterality: Left;  ? CATARACT EXTRACTION W/ INTRAOCULAR LENS  IMPLANT, BILATERAL Bilateral   ? EXCISION OF BREAST BIOPSY Left 06/22/2017  ? benign  ? TONSILLECTOMY    ? age 38  ? ? ?SOCIAL HISTORY: ?Social History  ? ?Socioeconomic History  ? Marital status: Married  ?  Spouse name: Not on file  ? Number of children: 0  ? Years of education: college  ? Highest education level: Not on file  ?Occupational History  ? Occupation: Retried Pharmacist, hospital  ?  Employer: [employer]  ?Tobacco Use  ? Smoking status: Never  ? Smokeless tobacco: Never  ?Vaping Use  ? Vaping Use: Never used  ?Substance and Sexual Activity  ? Alcohol use: No  ?  Alcohol/week: 0.0 standard drinks  ? Drug use: No  ? Sexual activity: Never  ?  Birth control/protection: Surgical  ?  Comment: hyst  ?Other Topics Concern  ? Not on file  ?Social History Narrative  ? Patient lives at home with husband  ([name]).   ? Patient is rtired caoll  ? Right handed.  ? Caffeine 8oz . Caffeine daily  ? ?Social Determinants of Health  ? ?Financial Resource Strain: Not on file  ?Food Insecurity: Not on file  ?Transportation Needs: Not on file  ?Physical Activity: Not on file  ?Stress: Not on file  ?Social Connections: Not on file   ?Intimate Partner Violence: Not on file  ? ? ?FAMILY HISTORY: ?Family History  ?Problem Relation Age of Onset  ? Hypertension Sister   ? Dementia Father   ? Stroke Mother   ? Stroke Maternal Grandmother   ? Stroke Maternal Grandfather   ? Stroke Paternal Grandmother   ? Stroke Paternal Grandfather   ? Stroke Sister   ? Stroke Brother   ? Dementia Brother   ? Heart attack Brother   ? ? ?Review of Systems  ?Constitutional:  Negative for appetite change, chills, fatigue, fever and unexpected weight change.  ?HENT:   Negative for hearing loss, lump/mass and trouble swallowing.   ?Eyes:  Negative for eye problems and icterus.  ?Respiratory:  Negative for chest tightness, cough and shortness of breath.   ?Cardiovascular:  Negative for chest pain, leg swelling and palpitations.  ?Gastrointestinal:  Negative for abdominal distention, abdominal pain, constipation, diarrhea, nausea and vomiting.  ?Endocrine: Negative for hot flashes.  ?Genitourinary:  Negative for difficulty urinating.   ?Musculoskeletal:  Negative for arthralgias.  ?  Skin:  Negative for itching and rash.  ?Neurological:  Negative for dizziness, extremity weakness, headaches and numbness.  ?Hematological:  Negative for adenopathy. Does not bruise/bleed easily.  ?Psychiatric/Behavioral:  Negative for depression. The patient is not nervous/anxious.    ? ? ?PHYSICAL EXAMINATION ? ?ECOG PERFORMANCE STATUS: 1 - Symptomatic but completely ambulatory ? ?Vitals:  ? 01/04/22 1110  ?BP: 139/67  ?Pulse: (!) 58  ?Resp: 16  ?Temp: 98.1 ?F (36.7 ?C)  ?SpO2: 100%  ? ? ?Physical Exam ?Constitutional:   ?   General: She is not in acute distress. ?   Appearance: Normal appearance. She is not toxic-appearing.  ?HENT:  ?   Head: Normocephalic and atraumatic.  ?Eyes:  ?   General: No scleral icterus. ?Cardiovascular:  ?   Rate and Rhythm: Normal rate and regular rhythm.  ?   Pulses: Normal pulses.  ?   Heart sounds: Normal heart sounds.  ?Pulmonary:  ?   Effort: Pulmonary effort  is normal.  ?   Breath sounds: Normal breath sounds.  ?Chest:  ?   Comments: Right breast is status postlumpectomy and radiation there is a slight thickness in the upper outer quadrant of the right breast no d

## 2022-01-11 ENCOUNTER — Ambulatory Visit (HOSPITAL_BASED_OUTPATIENT_CLINIC_OR_DEPARTMENT_OTHER)
Admission: RE | Admit: 2022-01-11 | Discharge: 2022-01-11 | Disposition: A | Discharge status: Home / Self Care | Payer: Medicare PPO | Source: Ambulatory Visit | Attending: Adult Health | Admitting: Adult Health

## 2022-01-11 DIAGNOSIS — M810 Age-related osteoporosis without current pathological fracture: Secondary | ICD-10-CM | POA: Diagnosis not present

## 2022-01-11 DIAGNOSIS — Z780 Asymptomatic menopausal state: Secondary | ICD-10-CM | POA: Diagnosis not present

## 2022-01-11 DIAGNOSIS — E2839 Other primary ovarian failure: Secondary | ICD-10-CM | POA: Diagnosis not present

## 2022-01-16 ENCOUNTER — Telehealth: Payer: Self-pay | Admitting: Adult Health

## 2022-01-16 NOTE — Telephone Encounter (Signed)
Per 4/17 in basket called pt to schedule telephone visit.  Left pt a message with appointment and call back number was left if changes are needed  ?

## 2022-01-24 ENCOUNTER — Ambulatory Visit
Admission: RE | Admit: 2022-01-24 | Discharge: 2022-01-24 | Disposition: A | Discharge status: Home / Self Care | Payer: Medicare PPO | Source: Ambulatory Visit | Attending: Adult Health | Admitting: Adult Health

## 2022-01-24 ENCOUNTER — Telehealth: Payer: Medicare PPO | Admitting: Adult Health

## 2022-01-24 ENCOUNTER — Inpatient Hospital Stay (HOSPITAL_BASED_OUTPATIENT_CLINIC_OR_DEPARTMENT_OTHER): Payer: Medicare PPO | Admitting: Adult Health

## 2022-01-24 DIAGNOSIS — Z170 Estrogen receptor positive status [ER+]: Secondary | ICD-10-CM | POA: Diagnosis not present

## 2022-01-24 DIAGNOSIS — C50411 Malignant neoplasm of upper-outer quadrant of right female breast: Secondary | ICD-10-CM

## 2022-01-24 DIAGNOSIS — M810 Age-related osteoporosis without current pathological fracture: Secondary | ICD-10-CM | POA: Insufficient documentation

## 2022-01-24 DIAGNOSIS — Z853 Personal history of malignant neoplasm of breast: Secondary | ICD-10-CM | POA: Diagnosis not present

## 2022-01-24 DIAGNOSIS — M816 Localized osteoporosis [Lequesne]: Secondary | ICD-10-CM | POA: Diagnosis not present

## 2022-01-24 NOTE — Progress Notes (Signed)
[facility] Follow up: ?  ? ?Pcp, No ?No address on file ? ? ?DIAGNOSIS:  Cancer Staging  ?Malignant neoplasm of upper-outer quadrant of right breast in female, estrogen receptor positive ([place]) ?Staging form: Breast, AJCC 8th Edition ?- Clinical stage from 05/23/2017: Stage IA (cT1c, cN0, cM0, G2, ER+, PR+, HER2-) - Unsigned ?Histologic grading system: 3 grade system ?Laterality: Right ?Staged by: Pathologist and managing physician ?Stage used in treatment planning: Yes ?National guidelines used in treatment planning: Yes ?Type of national guideline used in treatment planning: NCCN ?- Pathologic: Stage IA (pT1c, pN58m, cM0, G2, ER+, PR+, HER2-) - Signed by CGardenia Phlegm NP on 07/04/2017 ?Histologic grading system: 3 grade system ? ?I connected with Angela Buchanan [birth year] at  9:00 AM EDT by telephone and verified that I am speaking with the correct person using two identifiers.  ?I discussed the limitations, risks, security and privacy concerns of performing an evaluation and management service by telephone and the availability of in person appointments.  ?I also discussed with the patient that there may be a patient responsible charge related to this service. The patient expressed understanding and agreed to proceed.  ? ?Patient Location at [place] in [place]?Provider Location [facility] private office ? ?SUMMARY OF ONCOLOGIC HISTORY: ?Oncology History  ?Malignant neoplasm of upper-outer quadrant of right breast in female, estrogen receptor positive ([place]  ?05/16/2017 Initial Diagnosis  ? Left breast biopsy UIQ: Complex sclerosing lesion with calcifications and sclerosing adenosis, right breast biopsy 10:00: IDC grade 2, DCIS, ER/PR positive, HER-2 negative ratio 1.26,  11 mm lesion in the right breast at 10:00 position: T1b N0 stage IA clinical stage  ? ?  ?06/22/2017 Surgery  ? 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 cm, 1/3 micrometastatic disease in lymph nodes ; Left lumpectomy: Complex  sclerosing lesion; grade 2, ER +, PR 50%, HER-2 negative ratio 1.26, Ki-67 15%, T1 cN1 mic M0 stage IA AJCC 8 ? ? ?  ?08/07/2017 Oncotype testing  ? Oncotype DX score 27: Intermediate risk, 10 year risk of distant recurrence with tamoxifen alone 12% and chemotherapy plus tamoxifen 8% ? ? ?  ?08/09/2017 - 09/27/2017 Radiation Therapy  ? Adjuvant radiation therapy ? ?  ?10/2017 -  Anti-estrogen oral therapy  ? Letrozole daily, couldn't tolerate due to arthralgias, changed to Anastrozole in 11/2017 ? ?  ? ? ?CURRENT THERAPY: ? ?INTERVAL HISTORY: ?Angela [id number].o. female returns for f/u of her breast pain and osteoporosis.  Her new breast pain has remained in her right upper out breast and she takes tylenol during the day and advil at night.  I saw her back on April 5 and she was recommended to undergo mammogram and ultrasound to further evaluate and based on those results we could make a plan about her pain.  No swelling or redness is noted.  No lymphadenopathy.  She has mammogram and ultrasound scheduled for May 2 and has been unable to get in sooner. ? ?She underwent bone density testing on 01/11/2022 that demonstrated significant osteoporosis.  She tells me that 10 years ago she tried fosamax.  She had significant difficulty with Fosamax she says that she experienced severe esophagitis and felt like she was having a heart attack.  She has stopped any bisphosphonate therapy since then and is hesitant to try another oral medication because of the degree of esophagitis she experienced initially. ? ? ?Patient Active Problem List  ? Diagnosis Date Noted  ? Osteoporosis 01/24/2022  ?  Atypical chest pain 07/19/2021  ? PAC (premature atrial contraction) 05/11/2020  ? PVC (premature ventricular contraction) 05/11/2020  ? 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  ? Carotid bruit 11/13/2019  ? Pure hypercholesterolemia 11/13/2019  ? Malignant neoplasm of upper-outer quadrant of right breast in female, estrogen receptor positive ([place])  05/22/2017  ? Diplopia 09/17/2014  ? Dizziness and giddiness 09/17/2014  ? Essential hypertension 07/22/2014  ? Chest pain 07/22/2014  ? Family history of heart disease 07/22/2014  ? ? ?is allergic to atenolol, bisoprolol, prednisone, amoxicillin-pot clavulanate, hctz [hydrochlorothiazide], rosuvastatin, and latex. ? ?MEDICAL HISTORY: ?Past Medical History:  ?Diagnosis Date  ? Atypical chest pain 07/19/2021  ? Bigeminy   ? no current med.  ? Breast cancer ([place]) 06/2017  ? right  ? Bruises easily   ? Carotid bruit 11/13/2019  ? Dental crowns present   ? History of diverticulitis   ? Hypertension   ? states under control with med., has been on med. x 5 yr.  ? PAC (premature atrial contraction) 05/11/2020  ? Personal history of radiation therapy   ? 2018  ? Pure hypercholesterolemia 11/13/2019  ? PVC (premature ventricular contraction) 05/11/2020  ? 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 Seasonal allergies   ? 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  ? ? ?SURGICAL HISTORY: ?Past Surgical History:  ?Procedure Laterality Date  ? ABDOMINAL HYSTERECTOMY    ? partial  ? BREAST LUMPECTOMY Right 06/22/2017  ? Malignant  ? BREAST LUMPECTOMY WITH RADIOACTIVE SEED AND SENTINEL LYMPH NODE BIOPSY Right 06/22/2017  ? Procedure: RIGHT BREAST LUMPECTOMY WITH RADIOACTIVE SEED AND RIGHT AXILLARY DEEP SENTINEL LYMPH NODE BIOPSY WITH BLUE DYE INJECTION;  Surgeon: Fanny Skates, MD;  Location: [facility];  Service: General;  Laterality: Right;  ? BREAST LUMPECTOMY WITH RADIOACTIVE SEED LOCALIZATION Left 06/22/2017  ? Procedure: LEFT BREAST LUMPECTOMY WITH RADIOACTIVE SEED LOCALIZATION;  Surgeon: Fanny Skates, MD;  Location: [facility];  Service: General;  Laterality: Left;  ? CATARACT EXTRACTION W/ INTRAOCULAR LENS  IMPLANT, BILATERAL Bilateral   ? EXCISION OF BREAST BIOPSY Left 06/22/2017  ? benign  ? TONSILLECTOMY    ? age 71  ? ? ?SOCIAL HISTORY: ?Social History  ? ?Socioeconomic History  ? Marital status:  Married  ?  Spouse name: Not on file  ? Number of children: 0  ? Years of education: college  ? Highest education level: Not on file  ?Occupational History  ? Occupation: Retried Pharmacist, hospital  ?  Employer: [employer]  ?Tobacco Use  ? Smoking status: Never  ? Smokeless tobacco: Never  ?Vaping Use  ? Vaping Use: Never used  ?Substance and Sexual Activity  ? Alcohol use: No  ?  Alcohol/week: 0.0 standard drinks  ? Drug use: No  ? Sexual activity: Never  ?  Birth control/protection: Surgical  ?  Comment: hyst  ?Other Topics Concern  ? Not on file  ?Social History Narrative  ? Patient lives at home with husband  ([name]).   ? Patient is rtired caoll  ? Right handed.  ? Caffeine 8oz . Caffeine daily  ? ?Social Determinants of Health  ? ?Financial Resource Strain: Not on file  ?Food Insecurity: Not on file  ?Transportation Needs: Not on file  ?Physical Activity: Not on file  ?Stress: Not on file  ?Social Connections: Not on file  ?Intimate Partner Violence: Not on file  ? ? ?FAMILY HISTORY: ?Family History  ?Problem Relation Age of Onset  ? Hypertension Sister   ? Dementia Father   ?  Stroke Mother   ? Stroke Maternal Grandmother   ? Stroke Maternal Grandfather   ? Stroke Paternal Grandmother   ? Stroke Paternal Grandfather   ? Stroke Sister   ? Stroke Brother   ? Dementia Brother   ? Heart attack Brother   ? ? ?Review of Systems  ?Constitutional:  Negative for appetite change, chills, fatigue, fever and unexpected weight change.  ?HENT:   Negative for hearing loss, lump/mass and trouble swallowing.   ?Eyes:  Negative for eye problems and icterus.  ?Respiratory:  Negative for chest tightness, cough and shortness of breath.   ?Cardiovascular:  Negative for chest pain, leg swelling and palpitations.  ?Gastrointestinal:  Negative for abdominal distention, abdominal pain, constipation, diarrhea, nausea and vomiting.  ?Endocrine: Negative for hot flashes.  ?Genitourinary:  Negative for difficulty urinating.   ?Musculoskeletal:   Negative for arthralgias.  ?Skin:  Negative for itching and rash.  ?Neurological:  Negative for dizziness, extremity weakness, headaches and numbness.  ?Hematological:  Negative for adenopathy. Does not

## 2022-01-24 NOTE — Assessment & Plan Note (Signed)
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 cm, 1/3 micrometastatic disease in lymph nodes ; Left lumpectomy: Complex sclerosing lesion; grade 2, ER +, PR 50%, HER-2 negative ratio 1.26, Ki-67 15%, T1 cN1 mic M0 stage IA AJCC 8 ?? ?Bone scan 05/16/2018: No evidence of bone metastases?? ?Oncotype DX score 27: Intermediate risk, ROR 12% with tamoxifen alone and 8% with tamoxifen with chemo ? ?Carotid and RUE doppler notes right subclavian DVT started Xarelto in 11/2017? ?1.??Mammogram 05/05/2020: No evidence of malignancy ?2. Ultrasound right upper extremity 11/18/2018: No evidence of DVT in the upper extremity: We will discontinue Eliquis in 2021. ?? ?Adjuvant radiation therapy completed 09/27/2017 ? ?Current treatment: Adjuvant letrozole 2.5 mg daily (stopped 12/20/17 due to arthralgias) changed to Anastrozole ? ?Lauren I followed up today about her breast pain and osteoporosis.  She is taking anastrozole which can continue to decrease her bone density.  After reviewing her DEXA results which show osteoporosis I have recommended that she receive Prolia every 6 months.  We reviewed risks and benefits in detail.  I printed out the [place] and my nurses mailing this to her.  She is following up with her dentist this Wednesday and is going to review the Prolia with them since there is a slight rare chance of osteonecrosis of the jaw. ? ?Her breast pain continues and she is continuing to take Tylenol and Advil for this.  I recommended that she continue to take this for her pain.  I have followed up with [facility] imaging to see if there is any sooner appointment that she could have to get her imaging. ? ?Lanae will follow-up in 4 weeks for labs, an appointment to discuss any questions she may have about the Prolia, and her injection. ?

## 2022-01-25 ENCOUNTER — Telehealth: Payer: Self-pay | Admitting: Adult Health

## 2022-01-25 NOTE — Telephone Encounter (Signed)
Scheduled appointment per 4/25 los. Patient is aware. ?

## 2022-01-26 ENCOUNTER — Telehealth: Payer: Self-pay

## 2022-01-26 DIAGNOSIS — C50411 Malignant neoplasm of upper-outer quadrant of right female breast: Secondary | ICD-10-CM

## 2022-01-26 NOTE — Telephone Encounter (Signed)
-----   Message from Gardenia Phlegm, NP sent at 01/26/2022  7:56 AM EDT ----- ?Regarding: FW: ?Please let patient know that I am delighted it?s negative.  Please send referral to PT for Evan of breast lymphedema.  Thank you! ?----- Message ----- ?From: Interface, Rad Results In ?Sent: 01/24/2022   5:09 PM EDT ?To: Gardenia Phlegm, NP ? ?

## 2022-01-26 NOTE — Telephone Encounter (Signed)
Attempted to call pt regarding results. Orders placed for lymphedema eval per NP. LVM for pt to call back for results. ?

## 2022-01-31 ENCOUNTER — Other Ambulatory Visit: Payer: Medicare PPO

## 2022-02-07 ENCOUNTER — Ambulatory Visit: Payer: Medicare PPO

## 2022-02-09 ENCOUNTER — Ambulatory Visit: Payer: Medicare PPO | Attending: Adult Health

## 2022-02-09 DIAGNOSIS — R293 Abnormal posture: Secondary | ICD-10-CM | POA: Diagnosis not present

## 2022-02-09 DIAGNOSIS — C50411 Malignant neoplasm of upper-outer quadrant of right female breast: Secondary | ICD-10-CM | POA: Insufficient documentation

## 2022-02-09 DIAGNOSIS — M25611 Stiffness of right shoulder, not elsewhere classified: Secondary | ICD-10-CM | POA: Insufficient documentation

## 2022-02-09 DIAGNOSIS — Z170 Estrogen receptor positive status [ER+]: Secondary | ICD-10-CM | POA: Diagnosis not present

## 2022-02-09 NOTE — Therapy (Addendum)
?OUTPATIENT PHYSICAL THERAPY ONCOLOGY EVALUATION ? ?Patient Name: Angela Buchanan ?MRN: [id number] ?DOB:04-Feb-1951, 71 y.o., female ?Today's Date: 02/10/2022 ? ? PT End of Session - 02/09/22 1548   ? ? Visit Number 1   ? Number of Visits 12   ? Date for PT Re-Evaluation 03/23/22   ? PT Start Time 1505   ? PT Stop Time 6734   ? PT Time Calculation (min) 43 min   ? Activity Tolerance Patient tolerated treatment well   ? Behavior During Therapy [facility] [place] for tasks assessed/performed   ? ?  ?  ? ?  ? ? ?Past Medical History:  ?Diagnosis Date  ? Atypical chest pain 07/19/2021  ? Bigeminy   ? no current med.  ? Breast cancer ([place]) 06/2017  ? right  ? Bruises easily   ? Carotid bruit 11/13/2019  ? Dental crowns present   ? History of diverticulitis   ? Hypertension   ? states under control with med., has been on med. x 5 yr.  ? PAC (premature atrial contraction) 05/11/2020  ? Personal history of radiation therapy   ? 2018  ? Pure hypercholesterolemia 11/13/2019  ? PVC (premature ventricular contraction) 05/11/2020  ? 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 Seasonal allergies   ? 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  ? ?Past Surgical History:  ?Procedure Laterality Date  ? ABDOMINAL HYSTERECTOMY    ? partial  ? BREAST LUMPECTOMY Right 06/22/2017  ? Malignant  ? BREAST LUMPECTOMY WITH RADIOACTIVE SEED AND SENTINEL LYMPH NODE BIOPSY Right 06/22/2017  ? Procedure: RIGHT BREAST LUMPECTOMY WITH RADIOACTIVE SEED AND RIGHT AXILLARY DEEP SENTINEL LYMPH NODE BIOPSY WITH BLUE DYE INJECTION;  Surgeon: Fanny Skates, MD;  Location: [facility];  Service: General;  Laterality: Right;  ? BREAST LUMPECTOMY WITH RADIOACTIVE SEED LOCALIZATION Left 06/22/2017  ? Procedure: LEFT BREAST LUMPECTOMY WITH RADIOACTIVE SEED LOCALIZATION;  Surgeon: Fanny Skates, MD;  Location: [facility];  Service: General;  Laterality: Left;  ? CATARACT EXTRACTION W/ INTRAOCULAR LENS  IMPLANT, BILATERAL Bilateral   ? EXCISION OF BREAST BIOPSY  Left 06/22/2017  ? benign  ? TONSILLECTOMY    ? age 20  ? ?Patient Active Problem List  ? Diagnosis Date Noted  ? Osteoporosis 01/24/2022  ? Atypical chest pain 07/19/2021  ? PAC (premature atrial contraction) 05/11/2020  ? PVC (premature ventricular contraction) 05/11/2020  ? 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  ? Carotid bruit 11/13/2019  ? Pure hypercholesterolemia 11/13/2019  ? Malignant neoplasm of upper-outer quadrant of right breast in female, estrogen receptor positive ([place]) 05/22/2017  ? Diplopia 09/17/2014  ? Dizziness and giddiness 09/17/2014  ? Essential hypertension 07/22/2014  ? Chest pain 07/22/2014  ? Family history of heart disease 07/22/2014  ? ? ?PCP: unknown ? ?REFERRING PROVIDER: Wilber Bihari, NP ? ?REFERRING DIAG: Malignant neoplasm of upper-outer quadrant of right breast in female, estrogen receptor positive ([place]) ? ?THERAPY DIAG:  ?Malignant neoplasm of upper-outer quadrant of right breast in female, estrogen receptor positive ([place]) - Plan: PT plan of care cert/re-cert ? ?Abnormal posture - Plan: PT plan of care cert/re-cert ? ?Stiffness of right shoulder, not elsewhere classified - Plan: PT plan of care cert/re-cert ? ?ONSET DATE: 3 weeks ago ? ?SUBJECTIVE                                                                                                                                                                                          ? ?  SUBJECTIVE STATEMENT:Pt with complaints of Right lateral trunk/axilla pain.  It feels like a hot poker sticking in her right side. It comes and goes all day. Recent negative mammogram and [place]. ? ?PERTINENT HISTORY:  ? ?2018 Surgery  ?  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 cm, 1/3 micrometastatic disease in lymph nodes ; Left lumpectomy: Complex sclerosing lesion; grade 2, ER +, PR 50%, HER-2 negative ratio 1.26, Ki-67 15%, completed radiation in 09/2017, and is currently on Anastrazole. She has known osteoporosis. Treated previously for similar symptoms   ? ? ?PAIN:  ?Are you having pain? Yes ?NPRS scale: 6-7/10 ?Pain location: right lateral breast ?Pain orientation: Right and Lateral  ?PAIN TYPE: burning, sharp, and tight ?Pain description: intermittent  ?Aggravating factors: not sure ?Relieving factors: advil, tylenol helps ? ?PRECAUTIONS: Other: lymphedema risk , osteoporosis ? ?WEIGHT BEARING RESTRICTIONS No ? ?FALLS:  ?Has patient fallen in last 6 months? No ? ?LIVING ENVIRONMENT: ?Lives with: lives with their spouse ?Lives in: House/apartment ?Stairs: No; External: 3 steps; none ? ? ?OCCUPATION: retired Pharmacist, hospital ? ?LEISURE: walk a lot, ? ?HAND DOMINANCE : right  ? ?PRIOR LEVEL OF FUNCTION: Independent ? ?PATIENT GOALS Make the pain go away ? ? ?OBJECTIVE ? ?COGNITION: ? Overall cognitive status: Within functional limits for tasks assessed  ? ?PALPATION: Diffusely tender throughout entire right upper quarter ? ?OBSERVATIONS / OTHER ASSESSMENTS: incisions nicely healed. No signs of axillary cording presently ? ?SENSATION: ? Light touch: Appears intact ?  ? ? ?POSTURE: forward head, rounded shoulders ? ?UPPER EXTREMITY AROM/PROM: ? ?A/PROM RIGHT  02/09/2022 ?  ?Shoulder extension 29, tight  ?Shoulder flexion 115 tight  ?Shoulder abduction 89 tight  ?Shoulder internal rotation   ?Shoulder external rotation   ?  (Blank rows = not tested) ? ?A/PROM LEFT  02/09/2022  ?Shoulder extension 67  ?Shoulder flexion 150  ?Shoulder abduction 165  ?Shoulder internal rotation 77  ?Shoulder external rotation 97  ?  (Blank rows = not tested) ? ? ?CERVICAL AROM: ?All within functional limits:  ? ? ? ?UPPER EXTREMITY STRENGTH: WNL ? ? ?LYMPHEDEMA ASSESSMENTS:  ? ?SURGERY TYPE/DATE: Right lumpectomy with deep axillary SLNB 06/22/2017 ? ?NUMBER OF LYMPH NODES REMOVED: 3 ? ?CHEMOTHERAPY: no ? ?RADIATION:yes, ended 09/27/2017 ? ?HORMONE TREATMENT: yes, currently Anastrazole ? ?INFECTIONS: no ? ?LYMPHEDEMA ASSESSMENTS:  ? ?[place] RIGHT  02/09/2022  ?10 cm proximal to olecranon process 24.1   ?Olecranon process 22.6  ?10 cm proximal to ulnar styloid process 19.45  ?Just proximal to ulnar styloid process 14.7  ?Across hand at thumb web space 16.8  ?At base of 2nd digit 5.0  ?(Blank rows = not tested) ? ?[place] LEFT  02/09/2022  ?10 cm proximal to olecranon process 24.2  ?Olecranon process 22.6  ?10 cm proximal to ulnar styloid process 19.0  ?Just proximal to ulnar styloid process 14.5  ?Across hand at thumb web space 16.6  ?At base of 2nd digit 5.1  ?(Blank rows = not tested) ? ? ? ? ?QUICK DASH SURVEY: 39% ? ? ?TODAY'S TREATMENT  ?Pt educated in 4 post op exercises to begin for HEP. Supine AA flexion, stargazer, scapular retraction, and wall climbs for abduction to restore ROM ? ?PATIENT EDUCATION:  ?Education details: 4 post op exs ?Person educated: Patient ?Education method: Explanation, handouts ?Education comprehension: verbalized understanding ? ? ?HOME EXERCISE PROGRAM: ?4 post op exercises with flex and star gazer in supine ? ?ASSESSMENT: ? ?CLINICAL IMPRESSION: ?Patient is a 71 y.o. female who was seen today for physical  therapy evaluation and treatment for limitations in right shoulder ROM, and increased right upper quarter pain and tenderness,with negative mammogram/US. Surgery was performed on 06/22/2017 and there were 3 LN's removed. Pain developed about 3 weeks ago. She will benefit from skilled PT to address deficits and return to PLOF ? ? ?OBJECTIVE IMPAIRMENTS : limitations in ROM, strength,functional use of right UE  ? ?ACTIVITY LIMITATIONS cleaning and activities requiring use of right UE especially overhead .  ? ?PERSONAL FACTORS 1 comorbidity: right breast cancer s/p radiation  are also affecting patient's functional outcome.  ? ? ?REHAB POTENTIAL: Good ? ?CLINICAL DECISION MAKING: Stable/uncomplicated ? ?EVALUATION COMPLEXITY: Low ? ?GOALS: ?Goals reviewed with patient? Yes ? ?SHORT TERM GOALS: Target date:   03/02/2022 ? ?Pt will be independent in HEP to improve right shoulder  ROM ?Baseline: ?Goal status: INITIAL ? ?2.  Pt will have decreased pain by 25% ?Baseline:  ?Goal status: INITIAL ? ? ?LONG TERM GOALS: Target date: 03/24/2022  (Remove Blue Hyperlink) ? ?Pt will have decreased ri

## 2022-02-13 ENCOUNTER — Ambulatory Visit: Payer: Medicare PPO

## 2022-02-13 DIAGNOSIS — R293 Abnormal posture: Secondary | ICD-10-CM

## 2022-02-13 DIAGNOSIS — C50411 Malignant neoplasm of upper-outer quadrant of right female breast: Secondary | ICD-10-CM | POA: Diagnosis not present

## 2022-02-13 DIAGNOSIS — M25611 Stiffness of right shoulder, not elsewhere classified: Secondary | ICD-10-CM

## 2022-02-13 DIAGNOSIS — Z170 Estrogen receptor positive status [ER+]: Secondary | ICD-10-CM

## 2022-02-13 NOTE — Therapy (Signed)
?OUTPATIENT PHYSICAL THERAPY ONCOLOGY EVALUATION ? ?Patient Name: Angela Buchanan ?MRN: [id number] ?DOB:04-19-1951, 71 y.o., female ?Today's Date: 02/13/2022 ? ? PT End of Session - 02/13/22 1404   ? ? Visit Number 2   ? Number of Visits 12   ? Date for PT Re-Evaluation 03/23/22   ? PT Start Time 1405   ? PT Stop Time 1455   ? PT Time Calculation (min) 50 min   ? Activity Tolerance Patient tolerated treatment well   ? Behavior During Therapy [facility] for tasks assessed/performed   ? ?  ?  ? ?  ? ? ?Past Medical History:  ?Diagnosis Date  ? Atypical chest pain 07/19/2021  ? Bigeminy   ? no current med.  ? Breast cancer ([place]) 06/2017  ? right  ? Bruises easily   ? Carotid bruit 11/13/2019  ? Dental crowns present   ? History of diverticulitis   ? Hypertension   ? states under control with med., has been on med. x 5 yr.  ? PAC (premature atrial contraction) 05/11/2020  ? Personal history of radiation therapy   ? 2018  ? Pure hypercholesterolemia 11/13/2019  ? PVC (premature ventricular contraction) 05/11/2020  ? 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 Seasonal allergies   ? 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  ? ?Past Surgical History:  ?Procedure Laterality Date  ? ABDOMINAL HYSTERECTOMY    ? partial  ? BREAST LUMPECTOMY Right 06/22/2017  ? Malignant  ? BREAST LUMPECTOMY WITH RADIOACTIVE SEED AND SENTINEL LYMPH NODE BIOPSY Right 06/22/2017  ? Procedure: RIGHT BREAST LUMPECTOMY WITH RADIOACTIVE SEED AND RIGHT AXILLARY DEEP SENTINEL LYMPH NODE BIOPSY WITH BLUE DYE INJECTION;  Surgeon: Fanny Skates, MD;  Location: [place];  Service: General;  Laterality: Right;  ? BREAST LUMPECTOMY WITH RADIOACTIVE SEED LOCALIZATION Left 06/22/2017  ? Procedure: LEFT BREAST LUMPECTOMY WITH RADIOACTIVE SEED LOCALIZATION;  Surgeon: Fanny Skates, MD;  Location: [place];  Service: General;  Laterality: Left;  ? CATARACT EXTRACTION W/ INTRAOCULAR LENS  IMPLANT, BILATERAL Bilateral   ? EXCISION OF BREAST BIOPSY  Left 06/22/2017  ? benign  ? TONSILLECTOMY    ? age 49  ? ?Patient Active Problem List  ? Diagnosis Date Noted  ? Osteoporosis 01/24/2022  ? Atypical chest pain 07/19/2021  ? PAC (premature atrial contraction) 05/11/2020  ? PVC (premature ventricular contraction) 05/11/2020  ? 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  ? Carotid bruit 11/13/2019  ? Pure hypercholesterolemia 11/13/2019  ? Malignant neoplasm of upper-outer quadrant of right breast in female, estrogen receptor positive ([place]) 05/22/2017  ? Diplopia 09/17/2014  ? Dizziness and giddiness 09/17/2014  ? Essential hypertension 07/22/2014  ? Chest pain 07/22/2014  ? Family history of heart disease 07/22/2014  ? ? ?PCP: unknown ? ?REFERRING PROVIDER: Wilber Bihari, NP ? ?REFERRING DIAG: Malignant neoplasm of upper-outer quadrant of right breast in female, estrogen receptor positive ([place]) ? ?THERAPY DIAG:  ?Malignant neoplasm of upper-outer quadrant of right breast in female, estrogen receptor positive ([place]) ? ?Abnormal posture ? ?Stiffness of right shoulder, not elsewhere classified ? ?ONSET DATE: 3 weeks ago ? ?SUBJECTIVE                                                                                                                                                                                          ? ?  SUBJECTIVE STATEMENT: I have been doing the exercses a couple times a day.  The behind the head stretch is the hardest.  I don't see any real change yet with ROM. ? ?PERTINENT HISTORY:  ? ?2018 Surgery  ?  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 cm, 1/3 micrometastatic disease in lymph nodes ; Left lumpectomy: Complex sclerosing lesion; grade 2, ER +, PR 50%, HER-2 negative ratio 1.26, Ki-67 15%, completed radiation in 09/2017, and is currently on Anastrazole. She has known osteoporosis. Treated previously for similar symptoms  ? ? ?PAIN:  ?Are you having pain? Yes ?NPRS scale: 6/10 ?Pain location: right lateral breast ?Pain orientation: Right and Lateral  ?PAIN TYPE:  burning, sharp, and tight ?Pain description: intermittent  ?Aggravating factors: not sure ?Relieving factors: advil, tylenol helps ? ?PRECAUTIONS: Other: lymphedema risk , osteoporosis ? ?WEIGHT BEARING RESTRICTIONS No ? ?FALLS:  ?Has patient fallen in last 6 months? No ? ?LIVING ENVIRONMENT: ?Lives with: lives with their spouse ?Lives in: House/apartment ?Stairs: No; External: 3 steps; none ? ? ?OCCUPATION: retired Pharmacist, hospital ? ?LEISURE: walk a lot, ? ?HAND DOMINANCE : right  ? ?PRIOR LEVEL OF FUNCTION: Independent ? ?PATIENT GOALS Make the pain go away ? ? ?OBJECTIVE ? ?COGNITION: ? Overall cognitive status: Within functional limits for tasks assessed  ? ?PALPATION: Diffusely tender throughout entire right upper quarter ? ?OBSERVATIONS / OTHER ASSESSMENTS: incisions nicely healed. No signs of axillary cording presently ? ?SENSATION: ? Light touch: Appears intact ?  ? ? ?POSTURE: forward head, rounded shoulders ? ?UPPER EXTREMITY AROM/PROM: ? ?A/PROM RIGHT  02/09/2022 ?  ?Shoulder extension 29, tight  ?Shoulder flexion 115 tight  ?Shoulder abduction 89 tight  ?Shoulder internal rotation   ?Shoulder external rotation   ?  (Blank rows = not tested) ? ?A/PROM LEFT  02/09/2022  ?Shoulder extension 67  ?Shoulder flexion 150  ?Shoulder abduction 165  ?Shoulder internal rotation 77  ?Shoulder external rotation 97  ?  (Blank rows = not tested) ? ? ?CERVICAL AROM: ?All within functional limits:  ? ? ? ?UPPER EXTREMITY STRENGTH: WNL ? ? ?LYMPHEDEMA ASSESSMENTS:  ? ?SURGERY TYPE/DATE: Right lumpectomy with deep axillary SLNB 06/22/2017 ? ?NUMBER OF LYMPH NODES REMOVED: 3 ? ?CHEMOTHERAPY: no ? ?RADIATION:yes, ended 09/27/2017 ? ?HORMONE TREATMENT: yes, currently Anastrazole ? ?INFECTIONS: no ? ?LYMPHEDEMA ASSESSMENTS:  ? ?[place] RIGHT  02/09/2022  ?10 cm proximal to olecranon process 24.1  ?Olecranon process 22.6  ?10 cm proximal to ulnar styloid process 19.45  ?Just proximal to ulnar styloid process 14.7  ?Across hand at thumb web  space 16.8  ?At base of 2nd digit 5.0  ?(Blank rows = not tested) ? ?[place] LEFT  02/09/2022  ?10 cm proximal to olecranon process 24.2  ?Olecranon process 22.6  ?10 cm proximal to ulnar styloid process 19.0  ?Just proximal to ulnar styloid process 14.5  ?Across hand at thumb web space 16.6  ?At base of 2nd digit 5.1  ?(Blank rows = not tested) ? ? ? ? ?QUICK DASH SURVEY: 39% ? ? ?TODAY'S TREATMENT  ?02/13/2022 ?MANUAL ?Soft tissue mobilization in supine to right pectorals, lats, UT  in supine  and to right UT/lats, peri scapular area in left SL with cocoa butter. ?PROM to right shoulder flexion, scaption, abd, D2 Flexion and ER ?THERAPEUTIC EXERCISES ?AAROM flexion with clasped hands, stargazer and wall walks x 5 ?Lower trunk rotation knees to left to stretch right trunk  x5, Standing lat stretch with hands on counter x 2 ? ?02/09/2022 ?Pt educated in  4 post op exercises to begin for HEP. Supine AA flexion, stargazer, scapular retraction, and wall climbs for abduction to restore ROM ? ?PATIENT EDUCATION:  ?Education details: Access Code: [email or web address] ?URL: [email or web address] ?Date: 02/13/2022 ?Prepared by: Cheral Almas ? ?Exercises ?- Supine Lower Trunk Rotation  - 1-2 x daily - 7 x weekly - 1 sets - 5 reps - 5 hold ?- Standing Shoulder and Trunk Flexion at Table  - 1-2 x daily - 7 x weekly - 1 sets - 3-5 reps - 5 hold ?Person educated: Patient ?Education method: Explanation, handouts ?Education comprehension: verbalized understanding ? ? ?HOME EXERCISE PROGRAM: ?4 post op exercises with flex and star gazer in supine ? Supine Lower Trunk Rotation  - 1-2 x daily - 7 x weekly - 1 sets - 5 reps - 5 hold ?- Standing Shoulder and Trunk Flexion at Table  - 1-2 x daily - 7 x weekly - 1 sets - 3-5 reps - 5 hold ? ?ASSESSMENT: ? ?CLINICAL IMPRESSION: ?Less tenderness noted today in right upper quarter since pt has been doing the exs. She relaxed well with PROM today and visually had some good gains.  She felt  good stretch in the right lateral trunk with trunk rotation to the left.  HEP was updated ? ? ?OBJECTIVE IMPAIRMENTS : limitations in ROM, strength,functional use of right UE  ? ?ACTIVITY LIMITATIONS cl

## 2022-02-14 ENCOUNTER — Other Ambulatory Visit (HOSPITAL_BASED_OUTPATIENT_CLINIC_OR_DEPARTMENT_OTHER): Payer: Self-pay | Admitting: *Deleted

## 2022-02-14 DIAGNOSIS — I25118 Atherosclerotic heart disease of native coronary artery with other forms of angina pectoris: Secondary | ICD-10-CM

## 2022-02-14 MED ORDER — LISINOPRIL 40 MG PO TABS: 40.0000 mg | ORAL_TABLET | Freq: Every day | ORAL | 2 refills | Status: DC

## 2022-02-14 NOTE — Telephone Encounter (Signed)
Rx(s) sent to pharmacy electronically.  

## 2022-02-15 ENCOUNTER — Encounter: Payer: Medicare PPO | Admitting: Rehabilitation

## 2022-02-15 DIAGNOSIS — H524 Presbyopia: Secondary | ICD-10-CM | POA: Diagnosis not present

## 2022-02-15 DIAGNOSIS — H52203 Unspecified astigmatism, bilateral: Secondary | ICD-10-CM | POA: Diagnosis not present

## 2022-02-15 DIAGNOSIS — H5203 Hypermetropia, bilateral: Secondary | ICD-10-CM | POA: Diagnosis not present

## 2022-02-17 ENCOUNTER — Other Ambulatory Visit: Payer: Self-pay

## 2022-02-17 NOTE — Progress Notes (Signed)
Patient insurance will not cover Prolia, received orders to discontinue Prolia and change to zometa 4 mg IVPB q 6 months.  Orders updated.  T.O. Wilber Bihari, NP/[facility] Ronnald Ramp, PharmD

## 2022-02-20 ENCOUNTER — Ambulatory Visit: Payer: Medicare PPO

## 2022-02-20 DIAGNOSIS — C50411 Malignant neoplasm of upper-outer quadrant of right female breast: Secondary | ICD-10-CM | POA: Diagnosis not present

## 2022-02-20 DIAGNOSIS — M25611 Stiffness of right shoulder, not elsewhere classified: Secondary | ICD-10-CM

## 2022-02-20 DIAGNOSIS — Z170 Estrogen receptor positive status [ER+]: Secondary | ICD-10-CM | POA: Diagnosis not present

## 2022-02-20 DIAGNOSIS — R293 Abnormal posture: Secondary | ICD-10-CM | POA: Diagnosis not present

## 2022-02-20 NOTE — Patient Instructions (Signed)
SHOULDER: Flexion - Supine (Cane)        Cancer Rehab [phone]    Hold cane in both hands. Raise arms up overhead. Do not allow back to arch. Hold _5__ seconds. Do __5__ times; __2__ times a day.   Hands shoulder width apart  2 Hands slightly wider than shoulder width apart      .  Copyright  VHI. All rights reserved.

## 2022-02-20 NOTE — Therapy (Signed)
OUTPATIENT PHYSICAL THERAPY ONCOLOGY TREATMENT  Patient Name: Angela Buchanan MRN: [id number] DOB:1950-11-27, 71 y.o., female Today's Date: 02/20/2022   PT End of Session - 02/20/22 1456     Visit Number 3    Number of Visits 12    Date for PT Re-Evaluation 03/23/22    PT Start Time 1500    PT Stop Time 1553    PT Time Calculation (min) 53 min    Activity Tolerance Patient tolerated treatment well    Behavior During Therapy [facility] for tasks assessed/performed             Past Medical History:  Diagnosis Date   Atypical chest pain 07/19/2021   Bigeminy    no current med.   Breast cancer ([place]) 06/2017   right   Bruises easily    Carotid bruit 11/13/2019   Dental crowns present    History of diverticulitis    Hypertension    states under control with med., has been on med. x 5 yr.   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   Past Surgical History:  Procedure Laterality Date   ABDOMINAL HYSTERECTOMY     partial   BREAST LUMPECTOMY Right 06/22/2017   Malignant   BREAST LUMPECTOMY WITH RADIOACTIVE SEED AND SENTINEL LYMPH NODE BIOPSY Right 06/22/2017   Procedure: RIGHT BREAST LUMPECTOMY WITH RADIOACTIVE SEED AND RIGHT AXILLARY DEEP SENTINEL LYMPH NODE BIOPSY WITH BLUE DYE INJECTION;  Surgeon: Fanny Skates, MD;  Location: [place];  Service: General;  Laterality: Right;   BREAST LUMPECTOMY WITH RADIOACTIVE SEED LOCALIZATION Left 06/22/2017   Procedure: LEFT BREAST LUMPECTOMY WITH RADIOACTIVE SEED LOCALIZATION;  Surgeon: Fanny Skates, MD;  Location: [place];  Service: General;  Laterality: Left;   CATARACT EXTRACTION W/ INTRAOCULAR LENS  IMPLANT, BILATERAL Bilateral    EXCISION OF BREAST BIOPSY  Left 06/22/2017   benign   TONSILLECTOMY     age 83   Patient Active Problem List   Diagnosis Date Noted   Osteoporosis 01/24/2022   Atypical chest pain 07/19/2021   PAC (premature atrial contraction) 05/11/2020   PVC (premature ventricular contraction) 05/11/2020   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   Carotid bruit 11/13/2019   Pure hypercholesterolemia 11/13/2019   Malignant neoplasm of upper-outer quadrant of right breast in female, estrogen receptor positive ([place]) 05/22/2017   Diplopia 09/17/2014   Dizziness and giddiness 09/17/2014   Essential hypertension 07/22/2014   Chest pain 07/22/2014   Family history of heart disease 07/22/2014    PCP: unknown  REFERRING PROVIDER: Wilber Bihari, NP  REFERRING DIAG: Malignant neoplasm of upper-outer quadrant of right breast in female, estrogen receptor positive ([place])  THERAPY DIAG:  Malignant neoplasm of upper-outer quadrant of right breast in female, estrogen receptor positive ([place])  Abnormal posture  Stiffness of right shoulder, not elsewhere classified  ONSET DATE: 3 weeks ago  SUBJECTIVE  SUBJECTIVE STATEMENT:  I was so happy because I was improving so I decided to over do it on my chores and had pain on Friday night at right lateral breast/trunk. I got a little over zealous. Feeling better now  PERTINENT HISTORY:   2018 Surgery    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 cm, 1/3 micrometastatic disease in lymph nodes ; Left lumpectomy: Complex sclerosing lesion; grade 2, ER +, PR 50%, HER-2 negative ratio 1.26, Ki-67 15%, completed radiation in 09/2017, and is currently on Anastrazole. She has known osteoporosis. Treated previously for similar symptoms    PAIN:  Are you having pain? Yes NPRS scale: 5/10 Pain location: right lateral breast,  right shoulder Pain orientation: Right and Lateral  PAIN TYPE: burning, sharp, and tight Pain description: intermittent  Aggravating factors: not sure Relieving factors: advil, tylenol helps  PRECAUTIONS: Other: lymphedema risk , osteoporosis  WEIGHT BEARING RESTRICTIONS No  FALLS:  Has patient fallen in last 6 months? No  LIVING ENVIRONMENT: Lives with: lives with their spouse Lives in: House/apartment Stairs: No; External: 3 steps; none   OCCUPATION: retired Conservation officer, historic buildings: walk a lot,  HAND DOMINANCE : right   PRIOR LEVEL OF FUNCTION: Independent  PATIENT GOALS Make the pain go away   OBJECTIVE  COGNITION:  Overall cognitive status: Within functional limits for tasks assessed   PALPATION: Diffusely tender throughout entire right upper quarter  OBSERVATIONS / OTHER ASSESSMENTS: incisions nicely healed. No signs of axillary cording presently  SENSATION:  Light touch: Appears intact     POSTURE: forward head, rounded shoulders  UPPER EXTREMITY AROM/PROM:  A/PROM RIGHT  02/09/2022   Shoulder extension 29, tight  Shoulder flexion 115 tight  Shoulder abduction 89 tight  Shoulder internal rotation   Shoulder external rotation     (Blank rows = not tested)  A/PROM LEFT  02/09/2022  Shoulder extension 67  Shoulder flexion 150  Shoulder abduction 165  Shoulder internal rotation 77  Shoulder external rotation 97    (Blank rows = not tested)   CERVICAL AROM: All within functional limits:     UPPER EXTREMITY STRENGTH: WNL   LYMPHEDEMA ASSESSMENTS:   SURGERY TYPE/DATE: Right lumpectomy with deep axillary SLNB 06/22/2017  NUMBER OF LYMPH NODES REMOVED: 3  CHEMOTHERAPY: no  RADIATION:yes, ended 09/27/2017  HORMONE TREATMENT: yes, currently Anastrazole  INFECTIONS: no  LYMPHEDEMA ASSESSMENTS:   [place] RIGHT  02/09/2022  10 cm proximal to olecranon process 24.1  Olecranon process 22.6  10 cm proximal to ulnar styloid process 19.45  Just  proximal to ulnar styloid process 14.7  Across hand at thumb web space 16.8  At base of 2nd digit 5.0  (Blank rows = not tested)  [place] LEFT  02/09/2022  10 cm proximal to olecranon process 24.2  Olecranon process 22.6  10 cm proximal to ulnar styloid process 19.0  Just proximal to ulnar styloid process 14.5  Across hand at thumb web space 16.6  At base of 2nd digit 5.1  (Blank rows = not tested)     QUICK DASH SURVEY: 39%   TODAY'S TREATMENT   02/20/2022 Soft tissue mobilization in supine to right pectorals, lats, UT  in supine  and to right UT/lats, peri scapular area in left SL with cocoa butter. PROM to right shoulder flexion, scaption, abd, D2 Flexion and ER THERAPEUTIC EXERCISES AAROM flexion with clasped hands x 3, stargazer x 5, Supine wand flexion and scaption x 4 ea Lower trunk rotation knees to left and right to  stretch trunk  x5 with arms outstretched, and arms in goal post position.,   02/13/2022 MANUAL Soft tissue mobilization in supine to right pectorals, lats, UT  in supine  and to right UT/lats, peri scapular area in left SL with cocoa butter. PROM to right shoulder flexion, scaption, abd, D2 Flexion and ER THERAPEUTIC EXERCISES AAROM flexion with clasped hands, stargazer and wall walks x 5 Lower trunk rotation knees to left to stretch right trunk  x5, Standing lat stretch with hands on counter x 2  02/09/2022 Pt educated in 4 post op exercises to begin for HEP. Supine AA flexion, stargazer, scapular retraction, and wall climbs for abduction to restore ROM  PATIENT EDUCATION:  Education details: supine wand flexion and scaption Education method: Explanation, handouts Education comprehension: verbalized understanding, demonstrated understanding   HOME EXERCISE PROGRAM: 4 post op exercises with flex and star gazer in supine  Supine Lower Trunk Rotation  - 1-2 x daily - 7 x weekly - 1 sets - 5 reps - 5 hold - Standing Shoulder and Trunk Flexion at Table   - 1-2 x daily - 7 x weekly - 1 sets - 3-5 reps - 5 hold, Supine wand flexion and scaption  ASSESSMENT:  CLINICAL IMPRESSION: Pt felt better today after therapy. Added supine wand exercises for flexion and scaption to HEP and will remove clasped hands flexion, continued tightness in right upper quarter. Pt reminded about importance of proper posture  OBJECTIVE IMPAIRMENTS : limitations in ROM, strength,functional use of right UE   ACTIVITY LIMITATIONS cleaning and activities requiring use of right UE especially overhead .   PERSONAL FACTORS 1 comorbidity: right breast cancer s/p radiation  are also affecting patient's functional outcome.    REHAB POTENTIAL: Good  CLINICAL DECISION MAKING: Stable/uncomplicated  EVALUATION COMPLEXITY: Low  GOALS: Goals reviewed with patient? Yes  SHORT TERM GOALS: Target date:   03/02/2022  Pt will be independent in HEP to improve right shoulder ROM Baseline: Goal status: INITIAL  2.  Pt will have decreased pain by 25% Baseline:  Goal status: INITIAL   LONG TERM GOALS: Target date: 04/03/2022  (Remove Blue Hyperlink)  Pt will have decreased right upper quarter pain by 50% or greater Baseline:  Goal status: INITIAL  2.  Pt will have right shoulder flexion and abduction atleast 145 degrees for improved ability to reach Baseline:  Goal status: INITIAL  3.  Pts. Quick dash will be no greater than 20% to demonstrate improved function Baseline:  Goal status: INITIAL  4.  Pt will be able to resume usual household activities with Right UE Baseline:  Goal status: INITIAL      PLAN: PT FREQUENCY: 2x/week  PT DURATION: 6 weeks  PLANNED INTERVENTIONS: Therapeutic exercises, Therapeutic activity, Neuromuscular re-education, Patient/Family education, Joint mobilization, Orthotic/Fit training, Dry Needling, Manual lymph drainage, and Manual therapy  PLAN FOR NEXT SESSION: STM to right upper quarter, PROM, review HEP, check again for signs of  cording to right axilla, progress to strength as tolerated   Claris Pong, PT 02/20/2022, 3:57 PM

## 2022-02-21 ENCOUNTER — Other Ambulatory Visit: Payer: Medicare PPO

## 2022-02-21 ENCOUNTER — Ambulatory Visit: Payer: Medicare PPO | Admitting: Adult Health

## 2022-02-21 ENCOUNTER — Ambulatory Visit: Payer: Medicare PPO

## 2022-02-22 ENCOUNTER — Ambulatory Visit: Payer: Medicare PPO

## 2022-02-22 DIAGNOSIS — R293 Abnormal posture: Secondary | ICD-10-CM

## 2022-02-22 DIAGNOSIS — M25611 Stiffness of right shoulder, not elsewhere classified: Secondary | ICD-10-CM | POA: Diagnosis not present

## 2022-02-22 DIAGNOSIS — C50411 Malignant neoplasm of upper-outer quadrant of right female breast: Secondary | ICD-10-CM | POA: Diagnosis not present

## 2022-02-22 DIAGNOSIS — Z170 Estrogen receptor positive status [ER+]: Secondary | ICD-10-CM | POA: Diagnosis not present

## 2022-02-22 NOTE — Therapy (Signed)
OUTPATIENT PHYSICAL THERAPY ONCOLOGY TREATMENT  Patient Name: Angela Buchanan MRN: [id number] DOB:[birth year], 71 y.o., female Today's Date: 02/22/2022   PT End of Session - 02/22/22 1400     Visit Number 4    Number of Visits 12    Date for PT Re-Evaluation 03/23/22    PT Start Time 1401    Activity Tolerance Patient tolerated treatment well    Behavior During Therapy [facility] for tasks assessed/performed             Past Medical History:  Diagnosis Date   Atypical chest pain 07/19/2021   Bigeminy    no current med.   Breast cancer ([place]) 06/2017   right   Bruises easily    Carotid bruit 11/13/2019   Dental crowns present    History of diverticulitis    Hypertension    states under control with med., has been on med. x 5 yr.   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   Past Surgical History:  Procedure Laterality Date   ABDOMINAL HYSTERECTOMY     partial   BREAST LUMPECTOMY Right 06/22/2017   Malignant   BREAST LUMPECTOMY WITH RADIOACTIVE SEED AND SENTINEL LYMPH NODE BIOPSY Right 06/22/2017   Procedure: RIGHT BREAST LUMPECTOMY WITH RADIOACTIVE SEED AND RIGHT AXILLARY DEEP SENTINEL LYMPH NODE BIOPSY WITH BLUE DYE INJECTION;  Surgeon: Fanny Skates, MD;  Location: [place];  Service: General;  Laterality: Right;   BREAST LUMPECTOMY WITH RADIOACTIVE SEED LOCALIZATION Left 06/22/2017   Procedure: LEFT BREAST LUMPECTOMY WITH RADIOACTIVE SEED LOCALIZATION;  Surgeon: Fanny Skates, MD;  Location: [place];  Service: General;  Laterality: Left;   CATARACT EXTRACTION W/ INTRAOCULAR LENS  IMPLANT, BILATERAL Bilateral    EXCISION OF BREAST BIOPSY Left 06/22/2017   benign   TONSILLECTOMY     age 73    Patient Active Problem List   Diagnosis Date Noted   Osteoporosis 01/24/2022   Atypical chest pain 07/19/2021   PAC (premature atrial contraction) 05/11/2020   PVC (premature ventricular contraction) 05/11/2020   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   Carotid bruit 11/13/2019   Pure hypercholesterolemia 11/13/2019   Malignant neoplasm of upper-outer quadrant of right breast in female, estrogen receptor positive ([place]) 05/22/2017   Diplopia 09/17/2014   Dizziness and giddiness 09/17/2014   Essential hypertension 07/22/2014   Chest pain 07/22/2014   Family history of heart disease 07/22/2014    PCP: Gena Fray, NP  REFERRING PROVIDER: Wilber Bihari, NP  REFERRING DIAG: Malignant neoplasm of upper-outer quadrant of right breast in female, estrogen receptor positive ([place])  THERAPY DIAG:  Malignant neoplasm of upper-outer quadrant of right breast in female, estrogen receptor positive ([place])  Abnormal posture  Stiffness of right shoulder, not elsewhere classified  ONSET DATE: 3 weeks ago  SUBJECTIVE  SUBJECTIVE STATEMENT:   I was sore/achy all over my body yesterday but I don't know why.  I had a sharp pain yesterday in the breast area but its fine now. I feel like I am 50% better overall. I am getting more things done now and I feel better doing it.   PERTINENT HISTORY:   2018 Surgery    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 cm, 1/3 micrometastatic disease in lymph nodes ; Left lumpectomy: Complex sclerosing lesion; grade 2, ER +, PR 50%, HER-2 negative ratio 1.26, Ki-67 15%, completed radiation in 09/2017, and is currently on Anastrazole. She has known osteoporosis. Treated previously for similar symptoms    PAIN:  Are you having pain? no NPRS scale: o/10 Pain location: right lateral breast, right  shoulder Pain orientation: Right and Lateral  PAIN TYPE: burning, sharp, and tight Pain description: intermittent  Aggravating factors: not sure Relieving factors: advil, tylenol helps  PRECAUTIONS: Other: lymphedema risk , osteoporosis  WEIGHT BEARING RESTRICTIONS No  FALLS:  Has patient fallen in last 6 months? No  LIVING ENVIRONMENT: Lives with: lives with their spouse Lives in: House/apartment Stairs: No; External: 3 steps; none   OCCUPATION: retired Conservation officer, historic buildings: walk a lot,  HAND DOMINANCE : right   PRIOR LEVEL OF FUNCTION: Independent  PATIENT GOALS Make the pain go away   OBJECTIVE  COGNITION:  Overall cognitive status: Within functional limits for tasks assessed   PALPATION: Diffusely tender throughout entire right upper quarter  OBSERVATIONS / OTHER ASSESSMENTS: incisions nicely healed. No signs of axillary cording presently  SENSATION:  Light touch: Appears intact     POSTURE: forward head, rounded shoulders  UPPER EXTREMITY AROM/PROM:  A/PROM RIGHT  02/09/2022  Right 02/22/2022  Shoulder extension 29, tight 37  Shoulder flexion 115 tight 142  Shoulder abduction 89 tight 124  Shoulder internal rotation    Shoulder external rotation      (Blank rows = not tested)  A/PROM LEFT  02/09/2022  Shoulder extension 67  Shoulder flexion 150  Shoulder abduction 165  Shoulder internal rotation 77  Shoulder external rotation 97    (Blank rows = not tested)   CERVICAL AROM: All within functional limits:     UPPER EXTREMITY STRENGTH: WNL   LYMPHEDEMA ASSESSMENTS:   SURGERY TYPE/DATE: Right lumpectomy with deep axillary SLNB 06/22/2017  NUMBER OF LYMPH NODES REMOVED: 3  CHEMOTHERAPY: no  RADIATION:yes, ended 09/27/2017  HORMONE TREATMENT: yes, currently Anastrazole  INFECTIONS: no  LYMPHEDEMA ASSESSMENTS:   LANDMARK RIGHT  02/09/2022  10 cm proximal to olecranon process 24.1  Olecranon process 22.6  10 cm proximal to ulnar  styloid process 19.45  Just proximal to ulnar styloid process 14.7  Across hand at thumb web space 16.8  At base of 2nd digit 5.0  (Blank rows = not tested)  LANDMARK LEFT  02/09/2022  10 cm proximal to olecranon process 24.2  Olecranon process 22.6  10 cm proximal to ulnar styloid process 19.0  Just proximal to ulnar styloid process 14.5  Across hand at thumb web space 16.6  At base of 2nd digit 5.1  (Blank rows = not tested)     QUICK DASH SURVEY: 39%   TODAY'S TREATMENT  02/22/2022  Therapeutic exercise Pulleys x 2 min flexion and abduction, 1 min scaption. , ball rolls for flexion x 5, AROM bilateral flexion, scaption, horizontal abd x 5 MANUAL Soft tissue mobilization in supine to right pectorals, lats, UT  in supine  and to right UT/lats,  peri scapular area in left SL with cocoa butter. PROM to right shoulder flexion, scaption, abd, D2 Flexion and ER Measured AROM right shoulder  02/20/2022 Soft tissue mobilization in supine to right pectorals, lats, UT  in supine  and to right UT/lats, peri scapular area in left SL with cocoa butter. PROM to right shoulder flexion, scaption, abd, D2 Flexion and ER THERAPEUTIC EXERCISES AAROM flexion with clasped hands x 3, stargazer x 5, Supine wand flexion and scaption x 4 ea Lower trunk rotation knees to left and right to stretch trunk  x5 with arms outstretched, and arms in goal post position.,   02/13/2022 MANUAL Soft tissue mobilization in supine to right pectorals, lats, UT  in supine  and to right UT/lats, peri scapular area in left SL with cocoa butter. PROM to right shoulder flexion, scaption, abd, D2 Flexion and ER THERAPEUTIC EXERCISES AAROM flexion with clasped hands, stargazer and wall walks x 5 Lower trunk rotation knees to left to stretch right trunk  x5, Standing lat stretch with hands on counter x 2  02/09/2022 Pt educated in 4 post op exercises to begin for HEP. Supine AA flexion, stargazer, scapular retraction, and  wall climbs for abduction to restore ROM  PATIENT EDUCATION:  Education details: supine wand flexion and scaption Education method: Explanation, handouts Education comprehension: verbalized understanding, demonstrated understanding   HOME EXERCISE PROGRAM: 4 post op exercises with flex and star gazer in supine  Supine Lower Trunk Rotation  - 1-2 x daily - 7 x weekly - 1 sets - 5 reps - 5 hold - Standing Shoulder and Trunk Flexion at Table  - 1-2 x daily - 7 x weekly - 1 sets - 3-5 reps - 5 hold, Supine wand flexion and scaption  ASSESSMENT:  CLINICAL IMPRESSION: Pt made good improvements in AROM of the Right shoulder. She is still limited with shoulder ext and mostly with Abd,but has made good gains with all. She is compliant with HEP. OBJECTIVE IMPAIRMENTS : limitations in ROM, strength,functional use of right UE   ACTIVITY LIMITATIONS cleaning and activities requiring use of right UE especially overhead .   PERSONAL FACTORS 1 comorbidity: right breast cancer s/p radiation  are also affecting patient's functional outcome.    REHAB POTENTIAL: Good  CLINICAL DECISION MAKING: Stable/uncomplicated  EVALUATION COMPLEXITY: Low  GOALS: Goals reviewed with patient? Yes  SHORT TERM GOALS: Target date:   03/02/2022  Pt will be independent in HEP to improve right shoulder ROM Baseline: Goal status: MET5/24/2023  2.  Pt will have decreased pain by 25% Baseline:  Goal status: MET 02/22/2022   LONG TERM GOALS: Target date: 04/05/2022  (Remove Blue Hyperlink)  Pt will have decreased right upper quarter pain by 50% or greater Baseline:  Goal status: MET 02/22/2022  2.  Pt will have right shoulder flexion and abduction atleast 145 degrees for improved ability to reach Baseline:  Goal status: INITIAL  3.  Pts. Quick dash will be no greater than 20% to demonstrate improved function Baseline:  Goal status: INITIAL  4.  Pt will be able to resume usual household activities with Right  UE Baseline:  Goal status: INITIAL      PLAN: PT FREQUENCY: 2x/week  PT DURATION: 6 weeks  PLANNED INTERVENTIONS: Therapeutic exercises, Therapeutic activity, Neuromuscular re-education, Patient/Family education, Joint mobilization, Orthotic/Fit training, Dry Needling, Manual lymph drainage, and Manual therapy  PLAN FOR NEXT SESSION: STM to right upper quarter, PROM, review HEP, check again for signs of cording to right  axilla, progress to strength as tolerated   Claris Pong, PT 02/22/2022, 2:00 PM

## 2022-03-01 ENCOUNTER — Ambulatory Visit: Payer: Medicare PPO

## 2022-03-01 DIAGNOSIS — M25611 Stiffness of right shoulder, not elsewhere classified: Secondary | ICD-10-CM | POA: Diagnosis not present

## 2022-03-01 DIAGNOSIS — R293 Abnormal posture: Secondary | ICD-10-CM

## 2022-03-01 DIAGNOSIS — C50411 Malignant neoplasm of upper-outer quadrant of right female breast: Secondary | ICD-10-CM | POA: Diagnosis not present

## 2022-03-01 DIAGNOSIS — Z170 Estrogen receptor positive status [ER+]: Secondary | ICD-10-CM | POA: Diagnosis not present

## 2022-03-01 NOTE — Patient Instructions (Signed)

## 2022-03-01 NOTE — Therapy (Signed)
OUTPATIENT PHYSICAL THERAPY ONCOLOGY TREATMENT  Patient Name: Angela Buchanan MRN: [id number] DOB:09-15-1951, 71 y.o., female Today's Date: 03/01/2022   PT End of Session - 03/01/22 1256     Visit Number 5    Number of Visits 12    Date for PT Re-Evaluation 03/23/22    PT Start Time 4259    PT Stop Time 1351    PT Time Calculation (min) 52 min    Activity Tolerance Patient tolerated treatment well    Behavior During Therapy [facility] for tasks assessed/performed             Past Medical History:  Diagnosis Date   Atypical chest pain 07/19/2021   Bigeminy    no current med.   Breast cancer ([place]) 06/2017   right   Bruises easily    Carotid bruit 11/13/2019   Dental crowns present    History of diverticulitis    Hypertension    states under control with med., has been on med. x 5 yr.   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   Past Surgical History:  Procedure Laterality Date   ABDOMINAL HYSTERECTOMY     partial   BREAST LUMPECTOMY Right 06/22/2017   Malignant   BREAST LUMPECTOMY WITH RADIOACTIVE SEED AND SENTINEL LYMPH NODE BIOPSY Right 06/22/2017   Procedure: RIGHT BREAST LUMPECTOMY WITH RADIOACTIVE SEED AND RIGHT AXILLARY DEEP SENTINEL LYMPH NODE BIOPSY WITH BLUE DYE INJECTION;  Surgeon: Fanny Skates, MD;  Location: [place];  Service: General;  Laterality: Right;   BREAST LUMPECTOMY WITH RADIOACTIVE SEED LOCALIZATION Left 06/22/2017   Procedure: LEFT BREAST LUMPECTOMY WITH RADIOACTIVE SEED LOCALIZATION;  Surgeon: Fanny Skates, MD;  Location: [place];  Service: General;  Laterality: Left;   CATARACT EXTRACTION W/ INTRAOCULAR LENS  IMPLANT, BILATERAL Bilateral    EXCISION OF BREAST BIOPSY  Left 06/22/2017   benign   TONSILLECTOMY     age 59   Patient Active Problem List   Diagnosis Date Noted   Osteoporosis 01/24/2022   Atypical chest pain 07/19/2021   PAC (premature atrial contraction) 05/11/2020   PVC (premature ventricular contraction) 05/11/2020   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   Carotid bruit 11/13/2019   Pure hypercholesterolemia 11/13/2019   Malignant neoplasm of upper-outer quadrant of right breast in female, estrogen receptor positive ([place]) 05/22/2017   Diplopia 09/17/2014   Dizziness and giddiness 09/17/2014   Essential hypertension 07/22/2014   Chest pain 07/22/2014   Family history of heart disease 07/22/2014    PCP: Gena Fray, NP  REFERRING PROVIDER: Wilber Bihari, NP  REFERRING DIAG: Malignant neoplasm of upper-outer quadrant of right breast in female, estrogen receptor positive ([place])  THERAPY DIAG:  Malignant neoplasm of upper-outer quadrant of right breast in female, estrogen receptor positive ([place])  Abnormal posture  Stiffness of right shoulder, not elsewhere classified  ONSET DATE: 3 weeks ago  SUBJECTIVE  SUBJECTIVE STATEMENT:   The burning pain hasn't happened since last Wednesday. I am doing more at home and getting back to my normal activities and housework.  PERTINENT HISTORY:   2018 Surgery    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 cm, 1/3 micrometastatic disease in lymph nodes ; Left lumpectomy: Complex sclerosing lesion; grade 2, ER +, PR 50%, HER-2 negative ratio 1.26, Ki-67 15%, completed radiation in 09/2017, and is currently on Anastrazole. She has known osteoporosis. Treated previously for similar symptoms    PAIN:  Are you having pain? Yes left hip NPRS scale: 5/10 Pain location: left hip Pain orientation: Left LB/buttocks PAIN TYPE:  sharp Pain description: constant Aggravating factors: going over bumps in car Relieving factors: advil, tylenol helps  PRECAUTIONS: Other: lymphedema risk , osteoporosis  WEIGHT BEARING RESTRICTIONS No  FALLS:  Has patient fallen in last 6 months? No  LIVING ENVIRONMENT: Lives with: lives with their spouse Lives in: House/apartment Stairs: No; External: 3 steps; none   OCCUPATION: retired Conservation officer, historic buildings: walk a lot,  HAND DOMINANCE : right   PRIOR LEVEL OF FUNCTION: Independent  PATIENT GOALS Make the pain go away   OBJECTIVE  COGNITION:  Overall cognitive status: Within functional limits for tasks assessed   PALPATION: Diffusely tender throughout entire right upper quarter  OBSERVATIONS / OTHER ASSESSMENTS: incisions nicely healed. No signs of axillary cording presently  SENSATION:  Light touch: Appears intact     POSTURE: forward head, rounded shoulders  UPPER EXTREMITY AROM/PROM:  A/PROM RIGHT  02/09/2022  Right 02/22/2022  Shoulder extension 29, tight 37  Shoulder flexion 115 tight 142  Shoulder abduction 89 tight 124  Shoulder internal rotation    Shoulder external rotation      (Blank rows = not tested)  A/PROM LEFT  02/09/2022  Shoulder extension 67  Shoulder flexion 150  Shoulder abduction 165  Shoulder internal rotation 77  Shoulder external rotation 97    (Blank rows = not tested)   CERVICAL AROM: All within functional limits:     UPPER EXTREMITY STRENGTH: WNL   LYMPHEDEMA ASSESSMENTS:   SURGERY TYPE/DATE: Right lumpectomy with deep axillary SLNB 06/22/2017  NUMBER OF LYMPH NODES REMOVED: 3  CHEMOTHERAPY: no  RADIATION:yes, ended 09/27/2017  HORMONE TREATMENT: yes, currently Anastrazole  INFECTIONS: no  LYMPHEDEMA ASSESSMENTS:   LANDMARK RIGHT  02/09/2022  10 cm proximal to olecranon process 24.1  Olecranon process 22.6  10 cm proximal to ulnar styloid process 19.45  Just proximal to ulnar styloid process 14.7   Across hand at thumb web space 16.8  At base of 2nd digit 5.0  (Blank rows = not tested)  LANDMARK LEFT  02/09/2022  10 cm proximal to olecranon process 24.2  Olecranon process 22.6  10 cm proximal to ulnar styloid process 19.0  Just proximal to ulnar styloid process 14.5  Across hand at thumb web space 16.6  At base of 2nd digit 5.1  (Blank rows = not tested)     QUICK DASH SURVEY: 39%   TODAY'S TREATMENT  03/01/2022 Pulleys x 1:30 for flexion, scaption, abduction in chair with foam pad under her feet. Ball rolls flexion x 10 Standing AROM flexion and scaption x 10 with back against wall Soft tissue mobilization in supine to right pectorals, lats, UT  in supine  and to right UT/lats, peri scapular area in left SL with cocoa butter. PROM to right shoulder flexion, scaption, abd, D2 Flexion and ER Supine flexion with yellow band x5, horizontal abduction x  5,  ER x 5. Band cut for pt and pictures printed. Pt will not do sword exercise yet and will do TB only 2-3 days/week for 5 reps to start   02/22/2022 Therapeutic exercise Pulleys x 2 min flexion and abduction, 1 min scaption. , ball rolls for flexion x 5, AROM bilateral flexion, scaption, horizontal abd x 5 MANUAL Soft tissue mobilization in supine to right pectorals, lats, UT  in supine  and to right UT/lats, peri scapular area in left SL with cocoa butter. PROM to right shoulder flexion, scaption, abd, D2 Flexion and ER Measured AROM right shoulder  02/20/2022 Soft tissue mobilization in supine to right pectorals, lats, UT  in supine  and to right UT/lats, peri scapular area in left SL with cocoa butter. PROM to right shoulder flexion, scaption, abd, D2 Flexion and ER THERAPEUTIC EXERCISES AAROM flexion with clasped hands x 3, stargazer x 5, Supine wand flexion and scaption x 4 ea Lower trunk rotation knees to left and right to stretch trunk  x5 with arms outstretched, and arms in goal post position.,     02/09/2022 Pt  educated in 4 post op exercises to begin for HEP. Supine AA flexion, stargazer, scapular retraction, and wall climbs for abduction to restore ROM  PATIENT EDUCATION:  Education details: supine scapular series except sword x 5 Education method: Explanation, handouts Education comprehension: verbalized understanding, demonstrated understanding   HOME EXERCISE PROGRAM: 4 post op exercises with flex and star gazer in supine  Supine Lower Trunk Rotation  - 1-2 x daily - 7 x weekly - 1 sets - 5 reps - 5 hold - Standing Shoulder and Trunk Flexion at Table  - 1-2 x daily - 7 x weekly - 1 sets - 3-5 reps - 5 hold, Supine wand flexion and scaption Supine scapular series x 5 with yellow except sword ASSESSMENT:  CLINICAL IMPRESSION: Pt made good improvements in AROM of the Right shoulder.  She has not had any burning pain since 1 weeks ago. She notes that she always feels better after therapy. Updated HEP with supine scapular series and reminded to start with 5 reps only. OBJECTIVE IMPAIRMENTS : limitations in ROM, strength,functional use of right UE   ACTIVITY LIMITATIONS cleaning and activities requiring use of right UE especially overhead .   PERSONAL FACTORS 1 comorbidity: right breast cancer s/p radiation  are also affecting patient's functional outcome.    REHAB POTENTIAL: Good  CLINICAL DECISION MAKING: Stable/uncomplicated  EVALUATION COMPLEXITY: Low  GOALS: Goals reviewed with patient? Yes  SHORT TERM GOALS: Target date:   03/02/2022  Pt will be independent in HEP to improve right shoulder ROM Baseline: Goal status: MET5/24/2023  2.  Pt will have decreased pain by 25% Baseline:  Goal status: MET 02/22/2022   LONG TERM GOALS: Target date: 04/12/2022  (Remove Blue Hyperlink)  Pt will have decreased right upper quarter pain by 50% or greater Baseline:  Goal status: MET 02/22/2022  2.  Pt will have right shoulder flexion and abduction atleast 145 degrees for improved ability to  reach Baseline:  Goal status: INITIAL  3.  Pts. Quick dash will be no greater than 20% to demonstrate improved function Baseline:  Goal status: INITIAL  4.  Pt will be able to resume usual household activities with Right UE Baseline:  Goal status: INITIAL      PLAN: PT FREQUENCY: 2x/week  PT DURATION: 6 weeks  PLANNED INTERVENTIONS: Therapeutic exercises, Therapeutic activity, Neuromuscular re-education, Patient/Family education, Joint mobilization, Orthotic/Fit  training, Dry Needling, Manual lymph drainage, and Manual therapy  PLAN FOR NEXT SESSION: STM to right upper quarter, PROM, review HEP, check again for signs of cording to right axilla, progress to strength as tolerated   Claris Pong, PT 03/01/2022, 1:57 PM

## 2022-03-06 ENCOUNTER — Ambulatory Visit: Payer: Medicare PPO | Attending: Adult Health

## 2022-03-06 DIAGNOSIS — R293 Abnormal posture: Secondary | ICD-10-CM | POA: Diagnosis not present

## 2022-03-06 DIAGNOSIS — Z170 Estrogen receptor positive status [ER+]: Secondary | ICD-10-CM | POA: Diagnosis not present

## 2022-03-06 DIAGNOSIS — M25611 Stiffness of right shoulder, not elsewhere classified: Secondary | ICD-10-CM | POA: Diagnosis not present

## 2022-03-06 DIAGNOSIS — C50411 Malignant neoplasm of upper-outer quadrant of right female breast: Secondary | ICD-10-CM | POA: Insufficient documentation

## 2022-03-06 NOTE — Therapy (Signed)
OUTPATIENT PHYSICAL THERAPY ONCOLOGY TREATMENT  Patient Name: Angela Buchanan MRN: [id number] DOB:[birth year], 71 y.o., female Today's Date: 03/06/2022   PT End of Session - 03/06/22 1413     Visit Number 6    Number of Visits 12    Date for PT Re-Evaluation 03/23/22    PT Start Time 1409    PT Stop Time 1500    PT Time Calculation (min) 51 min    Activity Tolerance Patient tolerated treatment well    Behavior During Therapy [facility] for tasks assessed/performed             Past Medical History:  Diagnosis Date   Atypical chest pain 07/19/2021   Bigeminy    no current med.   Breast cancer ([place]) 06/2017   right   Bruises easily    Carotid bruit 11/13/2019   Dental crowns present    History of diverticulitis    Hypertension    states under control with med., has been on med. x 5 yr.   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   Past Surgical History:  Procedure Laterality Date   ABDOMINAL HYSTERECTOMY     partial   BREAST LUMPECTOMY Right 06/22/2017   Malignant   BREAST LUMPECTOMY WITH RADIOACTIVE SEED AND SENTINEL LYMPH NODE BIOPSY Right 06/22/2017   Procedure: RIGHT BREAST LUMPECTOMY WITH RADIOACTIVE SEED AND RIGHT AXILLARY DEEP SENTINEL LYMPH NODE BIOPSY WITH BLUE DYE INJECTION;  Surgeon: Fanny Skates, MD;  Location: [place];  Service: General;  Laterality: Right;   BREAST LUMPECTOMY WITH RADIOACTIVE SEED LOCALIZATION Left 06/22/2017   Procedure: LEFT BREAST LUMPECTOMY WITH RADIOACTIVE SEED LOCALIZATION;  Surgeon: Fanny Skates, MD;  Location: [place];  Service: General;  Laterality: Left;   CATARACT EXTRACTION W/ INTRAOCULAR LENS  IMPLANT, BILATERAL Bilateral    EXCISION OF BREAST BIOPSY  Left 06/22/2017   benign   TONSILLECTOMY     age 21   Patient Active Problem List   Diagnosis Date Noted   Osteoporosis 01/24/2022   Atypical chest pain 07/19/2021   PAC (premature atrial contraction) 05/11/2020   PVC (premature ventricular contraction) 05/11/2020   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   Carotid bruit 11/13/2019   Pure hypercholesterolemia 11/13/2019   Malignant neoplasm of upper-outer quadrant of right breast in female, estrogen receptor positive ([place]) 05/22/2017   Diplopia 09/17/2014   Dizziness and giddiness 09/17/2014   Essential hypertension 07/22/2014   Chest pain 07/22/2014   Family history of heart disease 07/22/2014    PCP: Gena Fray, NP  REFERRING PROVIDER: Wilber Bihari, NP  REFERRING DIAG: Malignant neoplasm of upper-outer quadrant of right breast in female, estrogen receptor positive ([place])  THERAPY DIAG:  Malignant neoplasm of upper-outer quadrant of right breast in female, estrogen receptor positive ([place])  Abnormal posture  Stiffness of right shoulder, not elsewhere classified  ONSET DATE: 3 weeks ago  SUBJECTIVE  SUBJECTIVE STATEMENT:   I haven't had any pain at all since last week in my right shoulder. LB is still hurting though. I think I need to see a Dr. About my back. Cleaned out about a third of my shed over the weekend.   PERTINENT HISTORY:   2018 Surgery    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 cm, 1/3 micrometastatic disease in lymph nodes ; Left lumpectomy: Complex sclerosing lesion; grade 2, ER +, PR 50%, HER-2 negative ratio 1.26, Ki-67 15%, completed radiation in 09/2017, and is currently on Anastrazole. She has known osteoporosis. Treated previously for similar symptoms    PAIN:  Are you having pain? Yes left hip NPRS scale: 5-6/10 Pain location:  left hip Pain orientation: Left LB/buttocks PAIN TYPE: sharp Pain description: constant Aggravating factors: going over bumps in car Relieving factors: advil, tylenol helps  PRECAUTIONS: Other: lymphedema risk , osteoporosis  WEIGHT BEARING RESTRICTIONS No  FALLS:  Has patient fallen in last 6 months? No  LIVING ENVIRONMENT: Lives with: lives with their spouse Lives in: House/apartment Stairs: No; External: 3 steps; none   OCCUPATION: retired Conservation officer, historic buildings: walk a lot,  HAND DOMINANCE : right   PRIOR LEVEL OF FUNCTION: Independent  PATIENT GOALS Make the pain go away   OBJECTIVE  COGNITION:  Overall cognitive status: Within functional limits for tasks assessed   PALPATION: Diffusely tender throughout entire right upper quarter  OBSERVATIONS / OTHER ASSESSMENTS: incisions nicely healed. No signs of axillary cording presently  SENSATION:  Light touch: Appears intact     POSTURE: forward head, rounded shoulders  UPPER EXTREMITY AROM/PROM:  A/PROM RIGHT  02/09/2022  Right 02/22/2022 03/06/2022  Shoulder extension 29, tight 37 45  Shoulder flexion 115 tight 142 153  Shoulder abduction 89 tight 124 150  Shoulder internal rotation     Shoulder external rotation       (Blank rows = not tested)  A/PROM LEFT  02/09/2022  Shoulder extension 67  Shoulder flexion 150  Shoulder abduction 165  Shoulder internal rotation 77  Shoulder external rotation 97    (Blank rows = not tested)   CERVICAL AROM: All within functional limits:     UPPER EXTREMITY STRENGTH: WNL   LYMPHEDEMA ASSESSMENTS:   SURGERY TYPE/DATE: Right lumpectomy with deep axillary SLNB 06/22/2017  NUMBER OF LYMPH NODES REMOVED: 3  CHEMOTHERAPY: no  RADIATION:yes, ended 09/27/2017  HORMONE TREATMENT: yes, currently Anastrazole  INFECTIONS: no  LYMPHEDEMA ASSESSMENTS:   LANDMARK RIGHT  02/09/2022  10 cm proximal to olecranon process 24.1  Olecranon process 22.6  10 cm proximal to  ulnar styloid process 19.45  Just proximal to ulnar styloid process 14.7  Across hand at thumb web space 16.8  At base of 2nd digit 5.0  (Blank rows = not tested)  LANDMARK LEFT  02/09/2022  10 cm proximal to olecranon process 24.2  Olecranon process 22.6  10 cm proximal to ulnar styloid process 19.0  Just proximal to ulnar styloid process 14.5  Across hand at thumb web space 16.6  At base of 2nd digit 5.1  (Blank rows = not tested)     QUICK DASH SURVEY: 39%   TODAY'S TREATMENT  03/06/2022 Pulleys x 1:30 for flexion, scaption, abduction in chair with foam pad under her feet. Ball rolls flexion x 10 Standing AROM flexion and scaption x 10 with back against wall Soft tissue mobilization in supine to right pectorals, lats, UT  in supine  and to right UT/lats, peri scapular area in  left SL with cocoa butter. PROM to right shoulder flexion, scaption, abd, D2 Flexion and ER Supine flexion with yellow band x5, horizontal abduction x 5,  ER x 5.  Standing scapular retraction x 5 with yellow   03/01/2022 Pulleys x 1:30 for flexion, scaption, abduction in chair with foam pad under her feet. Ball rolls flexion x 10 Standing AROM flexion and scaption x 10 with back against wall Soft tissue mobilization in supine to right pectorals, lats, UT  in supine  and to right UT/lats, peri scapular area in left SL with cocoa butter. PROM to right shoulder flexion, scaption, abd, D2 Flexion and ER Supine flexion with yellow band x5, horizontal abduction x 5,  ER x 5. Band cut for pt and pictures printed. Pt will not do sword exercise yet and will do TB only 2-3 days/week for 5 reps to start   02/22/2022 Therapeutic exercise Pulleys x 2 min flexion and abduction, 1 min scaption. , ball rolls for flexion x 5, AROM bilateral flexion, scaption, horizontal abd x 5 MANUAL Soft tissue mobilization in supine to right pectorals, lats, UT  in supine  and to right UT/lats, peri scapular area in left SL with cocoa  butter. PROM to right shoulder flexion, scaption, abd, D2 Flexion and ER Measured AROM right shoulder  02/20/2022 Soft tissue mobilization in supine to right pectorals, lats, UT  in supine  and to right UT/lats, peri scapular area in left SL with cocoa butter. PROM to right shoulder flexion, scaption, abd, D2 Flexion and ER THERAPEUTIC EXERCISES AAROM flexion with clasped hands x 3, stargazer x 5, Supine wand flexion and scaption x 4 ea Lower trunk rotation knees to left and right to stretch trunk  x5 with arms outstretched, and arms in goal post position.,     02/09/2022 Pt educated in 4 post op exercises to begin for HEP. Supine AA flexion, stargazer, scapular retraction, and wall climbs for abduction to restore ROM  PATIENT EDUCATION:  Education details: supine scapular series except sword x 5 Education method: Explanation, handouts Education comprehension: verbalized understanding, demonstrated understanding   HOME EXERCISE PROGRAM: 4 post op exercises with flex and star gazer in supine  Supine Lower Trunk Rotation  - 1-2 x daily - 7 x weekly - 1 sets - 5 reps - 5 hold - Standing Shoulder and Trunk Flexion at Table  - 1-2 x daily - 7 x weekly - 1 sets - 3-5 reps - 5 hold, Supine wand flexion and scaption Supine scapular series x 5 with yellow except sword ASSESSMENT:  CLINICAL IMPRESSION: Pt made good improvements in AROM of the Right shoulder. And she has now achieved shoulder ROM goal  She has not had any burning pain since 2 weeks ago.  She is starting to do more around the house, but has not yet resumed all of her household chores. She is tolerant of strengthening and demonstrated food form with exercises today with only occasional VC's OBJECTIVE IMPAIRMENTS : limitations in ROM, strength,functional use of right UE   ACTIVITY LIMITATIONS cleaning and activities requiring use of right UE especially overhead .   PERSONAL FACTORS 1 comorbidity: right breast cancer s/p radiation   are also affecting patient's functional outcome.    REHAB POTENTIAL: Good  CLINICAL DECISION MAKING: Stable/uncomplicated  EVALUATION COMPLEXITY: Low  GOALS: Goals reviewed with patient? Yes  SHORT TERM GOALS: Target date:   03/02/2022  Pt will be independent in HEP to improve right shoulder ROM Baseline: Goal status: MET5/24/2023  2.  Pt will have decreased pain by 25% Baseline:  Goal status: MET 02/22/2022   LONG TERM GOALS: Target date: 04/17/2022  (Remove Blue Hyperlink)  Pt will have decreased right upper quarter pain by 50% or greater Baseline:  Goal status: MET 02/22/2022  2.  Pt will have right shoulder flexion and abduction atleast 145 degrees for improved ability to reach Baseline:  Goal status: MET 03/06/2022 3.  Pts. Quick dash will be no greater than 20% to demonstrate improved function Baseline:  Goal status: INITIAL  4.  Pt will be able to resume usual household activities with Right UE Baseline:  Goal status: INITIAL      PLAN: PT FREQUENCY: 2x/week  PT DURATION: 6 weeks  PLANNED INTERVENTIONS: Therapeutic exercises, Therapeutic activity, Neuromuscular re-education, Patient/Family education, Joint mobilization, Orthotic/Fit training, Dry Needling, Manual lymph drainage, and Manual therapy  PLAN FOR NEXT SESSION: Add standing scap retraction and ext to HEP, STM to right upper quarter, PROM, review HEP, check again for signs of cording to right axilla, progress to strength as tolerated   Claris Pong, PT 03/06/2022, 3:12 PM

## 2022-03-13 ENCOUNTER — Ambulatory Visit: Payer: Medicare PPO

## 2022-03-13 DIAGNOSIS — R293 Abnormal posture: Secondary | ICD-10-CM

## 2022-03-13 DIAGNOSIS — C50411 Malignant neoplasm of upper-outer quadrant of right female breast: Secondary | ICD-10-CM

## 2022-03-13 DIAGNOSIS — Z170 Estrogen receptor positive status [ER+]: Secondary | ICD-10-CM | POA: Diagnosis not present

## 2022-03-13 DIAGNOSIS — M25611 Stiffness of right shoulder, not elsewhere classified: Secondary | ICD-10-CM

## 2022-03-13 NOTE — Therapy (Signed)
OUTPATIENT PHYSICAL THERAPY ONCOLOGY TREATMENT  Patient Name: Angela Buchanan MRN: [id number] DOB:[birth year], 71 y.o., female Today's Date: 03/13/2022   PT End of Session - 03/13/22 1403     Visit Number 7    Number of Visits 12    Date for PT Re-Evaluation 03/23/22    PT Start Time 1404    PT Stop Time 1457    PT Time Calculation (min) 53 min    Activity Tolerance Patient tolerated treatment well    Behavior During Therapy [facility] for tasks assessed/performed             Past Medical History:  Diagnosis Date   Atypical chest pain 07/19/2021   Bigeminy    no current med.   Breast cancer ([place]) 06/2017   right   Bruises easily    Carotid bruit 11/13/2019   Dental crowns present    History of diverticulitis    Hypertension    states under control with med., has been on med. x 5 yr.   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   Past Surgical History:  Procedure Laterality Date   ABDOMINAL HYSTERECTOMY     partial   BREAST LUMPECTOMY Right 06/22/2017   Malignant   BREAST LUMPECTOMY WITH RADIOACTIVE SEED AND SENTINEL LYMPH NODE BIOPSY Right 06/22/2017   Procedure: RIGHT BREAST LUMPECTOMY WITH RADIOACTIVE SEED AND RIGHT AXILLARY DEEP SENTINEL LYMPH NODE BIOPSY WITH BLUE DYE INJECTION;  Surgeon: Fanny Skates, MD;  Location: [place];  Service: General;  Laterality: Right;   BREAST LUMPECTOMY WITH RADIOACTIVE SEED LOCALIZATION Left 06/22/2017   Procedure: LEFT BREAST LUMPECTOMY WITH RADIOACTIVE SEED LOCALIZATION;  Surgeon: Fanny Skates, MD;  Location: [place];  Service: General;  Laterality: Left;   CATARACT EXTRACTION W/ INTRAOCULAR LENS  IMPLANT, BILATERAL Bilateral    EXCISION OF BREAST BIOPSY  Left 06/22/2017   benign   TONSILLECTOMY     age 42   Patient Active Problem List   Diagnosis Date Noted   Osteoporosis 01/24/2022   Atypical chest pain 07/19/2021   PAC (premature atrial contraction) 05/11/2020   PVC (premature ventricular contraction) 05/11/2020   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   Carotid bruit 11/13/2019   Pure hypercholesterolemia 11/13/2019   Malignant neoplasm of upper-outer quadrant of right breast in female, estrogen receptor positive ([place]) 05/22/2017   Diplopia 09/17/2014   Dizziness and giddiness 09/17/2014   Essential hypertension 07/22/2014   Chest pain 07/22/2014   Family history of heart disease 07/22/2014    PCP: Gena Fray, NP  REFERRING PROVIDER: Wilber Bihari, NP  REFERRING DIAG: Malignant neoplasm of upper-outer quadrant of right breast in female, estrogen receptor positive ([place])  THERAPY DIAG:  Malignant neoplasm of upper-outer quadrant of right breast in female, estrogen receptor positive ([place])  Abnormal posture  Stiffness of right shoulder, not elsewhere classified  ONSET DATE: 3 weeks ago  SUBJECTIVE  SUBJECTIVE STATEMENT:   No sharp pains in 2 weeks. I worked in the yard putting out straw and my arm got a little sore so I stopped. I always sleep better after I am here.  PERTINENT HISTORY:   2018 Surgery    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 cm, 1/3 micrometastatic disease in lymph nodes ; Left lumpectomy: Complex sclerosing lesion; grade 2, ER +, PR 50%, HER-2 negative ratio 1.26, Ki-67 15%, completed radiation in 09/2017, and is currently on Anastrazole. She has known osteoporosis. Treated previously for similar symptoms    PAIN:  Are you having pain? Yes left hip NPRS scale: 5-6/10 Pain location: left hip Pain orientation: Left LB/buttocks PAIN  TYPE: sharp Pain description: constant Aggravating factors: going over bumps in car Relieving factors: advil, tylenol helps  PRECAUTIONS: Other: lymphedema risk , osteoporosis  WEIGHT BEARING RESTRICTIONS No  FALLS:  Has patient fallen in last 6 months? No  LIVING ENVIRONMENT: Lives with: lives with their spouse Lives in: House/apartment Stairs: No; External: 3 steps; none   OCCUPATION: retired Conservation officer, historic buildings: walk a lot,  HAND DOMINANCE : right   PRIOR LEVEL OF FUNCTION: Independent  PATIENT GOALS Make the pain go away   OBJECTIVE  COGNITION:  Overall cognitive status: Within functional limits for tasks assessed   PALPATION: Diffusely tender throughout entire right upper quarter  OBSERVATIONS / OTHER ASSESSMENTS: incisions nicely healed. No signs of axillary cording presently  SENSATION:  Light touch: Appears intact     POSTURE: forward head, rounded shoulders  UPPER EXTREMITY AROM/PROM:  A/PROM RIGHT  02/09/2022  Right 02/22/2022 03/06/2022  Shoulder extension 29, tight 37 45  Shoulder flexion 115 tight 142 153  Shoulder abduction 89 tight 124 150  Shoulder internal rotation     Shoulder external rotation       (Blank rows = not tested)  A/PROM LEFT  02/09/2022  Shoulder extension 67  Shoulder flexion 150  Shoulder abduction 165  Shoulder internal rotation 77  Shoulder external rotation 97    (Blank rows = not tested)   CERVICAL AROM: All within functional limits:     UPPER EXTREMITY STRENGTH: WNL   LYMPHEDEMA ASSESSMENTS:   SURGERY TYPE/DATE: Right lumpectomy with deep axillary SLNB 06/22/2017  NUMBER OF LYMPH NODES REMOVED: 3  CHEMOTHERAPY: no  RADIATION:yes, ended 09/27/2017  HORMONE TREATMENT: yes, currently Anastrazole  INFECTIONS: no  LYMPHEDEMA ASSESSMENTS:   LANDMARK RIGHT  02/09/2022  10 cm proximal to olecranon process 24.1  Olecranon process 22.6  10 cm proximal to ulnar styloid process 19.45  Just proximal to ulnar  styloid process 14.7  Across hand at thumb web space 16.8  At base of 2nd digit 5.0  (Blank rows = not tested)  LANDMARK LEFT  02/09/2022  10 cm proximal to olecranon process 24.2  Olecranon process 22.6  10 cm proximal to ulnar styloid process 19.0  Just proximal to ulnar styloid process 14.5  Across hand at thumb web space 16.6  At base of 2nd digit 5.1  (Blank rows = not tested)     QUICK DASH SURVEY: 39%   TODAY'S TREATMENT   03/13/2022 Pulleys x 2 min flexion and scaption in chair with foam pad under her feet. Ball rolls flexion x 10 Standing AROM flexion and scaption x 10 with back against wall Soft tissue mobilization in supine to right pectorals, lats, UT  in supine  and to right UT/lats, peri scapular area in left SL with cocoa butter. PROM to right shoulder  flexion, scaption, abd, D2 Flexion and ER Supine flexion with yellow band x7, horizontal abduction x 7,  ER x 7.  03/06/2022 Pulleys x 1:30 for flexion, scaption, abduction in chair with foam pad under her feet. Ball rolls flexion x 10 Standing AROM flexion and scaption x 10 with back against wall Soft tissue mobilization in supine to right pectorals, lats, UT  in supine  and to right UT/lats, peri scapular area in left SL with cocoa butter. PROM to right shoulder flexion, scaption, abd, D2 Flexion and ER Supine flexion with yellow band x5, horizontal abduction x 5,  ER x 5.  Standing scapular retraction x 5 with yellow   03/01/2022 Pulleys x 1:30 for flexion, scaption, abduction in chair with foam pad under her feet. Ball rolls flexion x 10 Standing AROM flexion and scaption x 10 with back against wall Soft tissue mobilization in supine to right pectorals, lats, UT  in supine  and to right UT/lats, peri scapular area in left SL with cocoa butter. PROM to right shoulder flexion, scaption, abd, D2 Flexion and ER Supine flexion with yellow band x5, horizontal abduction x 5,  ER x 5. Band cut for pt and pictures printed.  Pt will not do sword exercise yet and will do TB only 2-3 days/week for 5 reps to start   02/22/2022 Therapeutic exercise Pulleys x 2 min flexion and abduction, 1 min scaption. , ball rolls for flexion x 5, AROM bilateral flexion, scaption, horizontal abd x 5 MANUAL Soft tissue mobilization in supine to right pectorals, lats, UT  in supine  and to right UT/lats, peri scapular area in left SL with cocoa butter. PROM to right shoulder flexion, scaption, abd, D2 Flexion and ER Measured AROM right shoulder  02/20/2022 Soft tissue mobilization in supine to right pectorals, lats, UT  in supine  and to right UT/lats, peri scapular area in left SL with cocoa butter. PROM to right shoulder flexion, scaption, abd, D2 Flexion and ER THERAPEUTIC EXERCISES AAROM flexion with clasped hands x 3, stargazer x 5, Supine wand flexion and scaption x 4 ea Lower trunk rotation knees to left and right to stretch trunk  x5 with arms outstretched, and arms in goal post position.,     02/09/2022 Pt educated in 4 post op exercises to begin for HEP. Supine AA flexion, stargazer, scapular retraction, and wall climbs for abduction to restore ROM  PATIENT EDUCATION:  Education details: supine scapular series except sword x 5 Education method: Explanation, handouts Education comprehension: verbalized understanding, demonstrated understanding   HOME EXERCISE PROGRAM: 4 post op exercises with flex and star gazer in supine  Supine Lower Trunk Rotation  - 1-2 x daily - 7 x weekly - 1 sets - 5 reps - 5 hold - Standing Shoulder and Trunk Flexion at Table  - 1-2 x daily - 7 x weekly - 1 sets - 3-5 reps - 5 hold, Supine wand flexion and scaption Supine scapular series x 5 with yellow except sword ASSESSMENT:  CLINICAL IMPRESSION: Pt has not had any sharp pains in greater than 2 weeks. She is able to progress her household chores, but still fatigues easily with reaching.  OBJECTIVE IMPAIRMENTS : limitations in ROM,  strength,functional use of right UE   ACTIVITY LIMITATIONS cleaning and activities requiring use of right UE especially overhead .   PERSONAL FACTORS 1 comorbidity: right breast cancer s/p radiation  are also affecting patient's functional outcome.    REHAB POTENTIAL: Good  CLINICAL DECISION MAKING:  Stable/uncomplicated  EVALUATION COMPLEXITY: Low  GOALS: Goals reviewed with patient? Yes  SHORT TERM GOALS: Target date:   03/02/2022  Pt will be independent in HEP to improve right shoulder ROM Baseline: Goal status: MET5/24/2023  2.  Pt will have decreased pain by 25% Baseline:  Goal status: MET 02/22/2022   LONG TERM GOALS: Target date: 04/24/2022  (Remove Blue Hyperlink)  Pt will have decreased right upper quarter pain by 50% or greater Baseline:  Goal status: MET 02/22/2022  2.  Pt will have right shoulder flexion and abduction atleast 145 degrees for improved ability to reach Baseline:  Goal status: MET 03/06/2022 3.  Pts. Quick dash will be no greater than 20% to demonstrate improved function Baseline:  Goal status: INITIAL  4.  Pt will be able to resume usual household activities with Right UE Baseline:  Goal status: INITIAL      PLAN: PT FREQUENCY: 2x/week  PT DURATION: 6 weeks  PLANNED INTERVENTIONS: Therapeutic exercises, Therapeutic activity, Neuromuscular re-education, Patient/Family education, Joint mobilization, Orthotic/Fit training, Dry Needling, Manual lymph drainage, and Manual therapy  PLAN FOR NEXT SESSION: Add standing scap retraction and ext to HEP, STM to right upper quarter, PROM, review HEP, check again for signs of cording to right axilla, progress to strength as tolerated   Claris Pong, PT 03/13/2022, 2:59 PM

## 2022-03-15 ENCOUNTER — Ambulatory Visit: Payer: Medicare PPO

## 2022-03-15 DIAGNOSIS — Z170 Estrogen receptor positive status [ER+]: Secondary | ICD-10-CM

## 2022-03-15 DIAGNOSIS — M25611 Stiffness of right shoulder, not elsewhere classified: Secondary | ICD-10-CM

## 2022-03-15 DIAGNOSIS — C50411 Malignant neoplasm of upper-outer quadrant of right female breast: Secondary | ICD-10-CM | POA: Diagnosis not present

## 2022-03-15 DIAGNOSIS — R293 Abnormal posture: Secondary | ICD-10-CM | POA: Diagnosis not present

## 2022-03-15 NOTE — Therapy (Signed)
OUTPATIENT PHYSICAL THERAPY ONCOLOGY TREATMENT  Patient Name: Angela Buchanan MRN: [id number] DOB:02-14-1951, 71 y.o., female Today's Date: 03/15/2022   PT End of Session - 03/15/22 1411     Visit Number 8    Number of Visits 12    Date for PT Re-Evaluation 03/23/22    PT Start Time 1408    PT Stop Time 1500    PT Time Calculation (min) 52 min    Activity Tolerance Patient tolerated treatment well    Behavior During Therapy [facility] for tasks assessed/performed             Past Medical History:  Diagnosis Date   Atypical chest pain 07/19/2021   Bigeminy    no current med.   Breast cancer ([place]) 06/2017   right   Bruises easily    Carotid bruit 11/13/2019   Dental crowns present    History of diverticulitis    Hypertension    states under control with med., has been on med. x 5 yr.   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   Past Surgical History:  Procedure Laterality Date   ABDOMINAL HYSTERECTOMY     partial   BREAST LUMPECTOMY Right 06/22/2017   Malignant   BREAST LUMPECTOMY WITH RADIOACTIVE SEED AND SENTINEL LYMPH NODE BIOPSY Right 06/22/2017   Procedure: RIGHT BREAST LUMPECTOMY WITH RADIOACTIVE SEED AND RIGHT AXILLARY DEEP SENTINEL LYMPH NODE BIOPSY WITH BLUE DYE INJECTION;  Surgeon: Fanny Skates, MD;  Location: [place];  Service: General;  Laterality: Right;   BREAST LUMPECTOMY WITH RADIOACTIVE SEED LOCALIZATION Left 06/22/2017   Procedure: LEFT BREAST LUMPECTOMY WITH RADIOACTIVE SEED LOCALIZATION;  Surgeon: Fanny Skates, MD;  Location: [place];  Service: General;  Laterality: Left;   CATARACT EXTRACTION W/ INTRAOCULAR LENS  IMPLANT, BILATERAL Bilateral    EXCISION OF BREAST BIOPSY  Left 06/22/2017   benign   TONSILLECTOMY     age 18   Patient Active Problem List   Diagnosis Date Noted   Osteoporosis 01/24/2022   Atypical chest pain 07/19/2021   PAC (premature atrial contraction) 05/11/2020   PVC (premature ventricular contraction) 05/11/2020   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   Carotid bruit 11/13/2019   Pure hypercholesterolemia 11/13/2019   Malignant neoplasm of upper-outer quadrant of right breast in female, estrogen receptor positive ([place]) 05/22/2017   Diplopia 09/17/2014   Dizziness and giddiness 09/17/2014   Essential hypertension 07/22/2014   Chest pain 07/22/2014   Family history of heart disease 07/22/2014    PCP: Gena Fray, NP  REFERRING PROVIDER: Wilber Bihari, NP  REFERRING DIAG: Malignant neoplasm of upper-outer quadrant of right breast in female, estrogen receptor positive ([place])  THERAPY DIAG:  Malignant neoplasm of upper-outer quadrant of right breast in female, estrogen receptor positive ([place])  Abnormal posture  Stiffness of right shoulder, not elsewhere classified  ONSET DATE: 3 weeks ago  SUBJECTIVE  SUBJECTIVE STATEMENT:   Did well after last visit, and still not having sharp pains. I feel like doing more now that I am not having pain. I have not washed my car yet but I a doing most other things now  PERTINENT HISTORY:   2018 Surgery    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 cm, 1/3 micrometastatic disease in lymph nodes ; Left lumpectomy: Complex sclerosing lesion; grade 2, ER +, PR 50%, HER-2 negative ratio 1.26, Ki-67 15%, completed radiation in 09/2017, and is currently on Anastrazole. She has known osteoporosis. Treated previously for similar symptoms    PAIN:  Are you having pain? Not in right shoulder, Yes left hip NPRS scale: 5/10 Pain  location: left hip Pain orientation: Left LB/buttocks PAIN TYPE: sharp Pain description: constant Aggravating factors: going over bumps in car Relieving factors: advil, tylenol helps  PRECAUTIONS: Other: lymphedema risk , osteoporosis  WEIGHT BEARING RESTRICTIONS No  FALLS:  Has patient fallen in last 6 months? No  LIVING ENVIRONMENT: Lives with: lives with their spouse Lives in: House/apartment Stairs: No; External: 3 steps; none   OCCUPATION: retired Conservation officer, historic buildings: walk a lot,  HAND DOMINANCE : right   PRIOR LEVEL OF FUNCTION: Independent  PATIENT GOALS Make the pain go away   OBJECTIVE  COGNITION:  Overall cognitive status: Within functional limits for tasks assessed   PALPATION: Diffusely tender throughout entire right upper quarter  OBSERVATIONS / OTHER ASSESSMENTS: incisions nicely healed. No signs of axillary cording presently  SENSATION:  Light touch: Appears intact     POSTURE: forward head, rounded shoulders  UPPER EXTREMITY AROM/PROM:  A/PROM RIGHT  02/09/2022  Right 02/22/2022 03/06/2022 03/15/2022  Shoulder extension 29, tight 37 45 47  Shoulder flexion 115 tight 142 153 152  Shoulder abduction 89 tight 124 150 165  Shoulder internal rotation      Shoulder external rotation        (Blank rows = not tested)  A/PROM LEFT  02/09/2022  Shoulder extension 67  Shoulder flexion 150  Shoulder abduction 165  Shoulder internal rotation 77  Shoulder external rotation 97    (Blank rows = not tested)   CERVICAL AROM: All within functional limits:     UPPER EXTREMITY STRENGTH: WNL   LYMPHEDEMA ASSESSMENTS:   SURGERY TYPE/DATE: Right lumpectomy with deep axillary SLNB 06/22/2017  NUMBER OF LYMPH NODES REMOVED: 3  CHEMOTHERAPY: no  RADIATION:yes, ended 09/27/2017  HORMONE TREATMENT: yes, currently Anastrazole  INFECTIONS: no  LYMPHEDEMA ASSESSMENTS:   LANDMARK RIGHT  02/09/2022  10 cm proximal to olecranon process 24.1  Olecranon  process 22.6  10 cm proximal to ulnar styloid process 19.45  Just proximal to ulnar styloid process 14.7  Across hand at thumb web space 16.8  At base of 2nd digit 5.0  (Blank rows = not tested)  LANDMARK LEFT  02/09/2022  10 cm proximal to olecranon process 24.2  Olecranon process 22.6  10 cm proximal to ulnar styloid process 19.0  Just proximal to ulnar styloid process 14.5  Across hand at thumb web space 16.6  At base of 2nd digit 5.1  (Blank rows = not tested)     QUICK DASH SURVEY: 39% initial,    0% 03/15/2022   TODAY'S TREATMENT  03/15/2022 Measured shoulder ROM and assessed goals Pulleys x 2 min flexion and scaption in chair with foam pad under her feet. Ball rolls flexion and abd  x 5 Standing bilateral scapular retraction, shoulder extension, ER with yellow x 10 Standing  bilateral shoulder flexion and scaption x 10 Bilateral biceps curls with yellow x 10, Triceps extension Bilaterally yellow x 10 Supine horizontal abd x 10, sword x 5 bilaterally. Occasional VC's for proper form   03/13/2022 Pulleys x 2 min flexion and scaption in chair with foam pad under her feet. Ball rolls flexion x 10 Standing AROM flexion and scaption x 10 with back against wall Soft tissue mobilization in supine to right pectorals, lats, UT  in supine  and to right UT/lats, peri scapular area in left SL with cocoa butter. PROM to right shoulder flexion, scaption, abd, D2 Flexion and ER Supine flexion with yellow band x7, horizontal abduction x 7,  ER x 7.    03/06/2022 Pulleys x 1:30 for flexion, scaption, abduction in chair with foam pad under her feet. Ball rolls flexion x 10 Standing AROM flexion and scaption x 10 with back against wall Soft tissue mobilization in supine to right pectorals, lats, UT  in supine  and to right UT/lats, peri scapular area in left SL with cocoa butter. PROM to right shoulder flexion, scaption, abd, D2 Flexion and ER Supine flexion with yellow band x5, horizontal  abduction x 5,  ER x 5.  Standing scapular retraction x 5 with yellow   03/01/2022 Pulleys x 1:30 for flexion, scaption, abduction in chair with foam pad under her feet. Ball rolls flexion x 10 Standing AROM flexion and scaption x 10 with back against wall Soft tissue mobilization in supine to right pectorals, lats, UT  in supine  and to right UT/lats, peri scapular area in left SL with cocoa butter. PROM to right shoulder flexion, scaption, abd, D2 Flexion and ER Supine flexion with yellow band x5, horizontal abduction x 5,  ER x 5. Band cut for pt and pictures printed. Pt will not do sword exercise yet and will do TB only 2-3 days/week for 5 reps to start    PATIENT EDUCATION:  Education details: standing Scapular retraction, shoulder extension, ER, biceps curls, Triceps extension all with yellow band x 10, shoulder elevation x 10 against wall Education method: Explanation,demonstration, handouts Education comprehension: verbalized understanding, demonstrated understanding   HOME EXERCISE PROGRAM: 4 post op exercises with flex and star gazer in supine  Supine Lower Trunk Rotation  - 1-2 x daily - 7 x weekly - 1 sets - 5 reps - 5 hold - Standing Shoulder and Trunk Flexion at Table  - 1-2 x daily - 7 x weekly - 1 sets - 3-5 reps - 5 hold, Supine wand flexion and scaption Supine scapular series x 5 with yellow except sword Standing scapular retraction, extension, bilateral ER, biceps curls, triceps extension ASSESSMENT:  CLINICAL IMPRESSION: Pt has not had any sharp pains in greater than 2 weeks. She is able to progress her household chores. She has achieved all goals established and demonstrates excellent right shoulder ROM. Quick dash has returned to 0%.  She is independent and compliant with HEP. She is ready for discharge.  OBJECTIVE IMPAIRMENTS : limitations in ROM, strength,functional use of right UE   ACTIVITY LIMITATIONS cleaning and activities requiring use of right UE  especially overhead .   PERSONAL FACTORS 1 comorbidity: right breast cancer s/p radiation  are also affecting patient's functional outcome.    REHAB POTENTIAL: Good  CLINICAL DECISION MAKING: Stable/uncomplicated  EVALUATION COMPLEXITY: Low  GOALS: Goals reviewed with patient? Yes  SHORT TERM GOALS: Target date:   03/02/2022  Pt will be independent in HEP to improve right shoulder  ROM Baseline: Goal status: MET5/24/2023  2.  Pt will have decreased pain by 25% Baseline:  Goal status: MET 02/22/2022   LONG TERM GOALS: Target date: 04/26/2022  (Remove Blue Hyperlink)  Pt will have decreased right upper quarter pain by 50% or greater Baseline:  Goal status: MET 02/22/2022  2.  Pt will have right shoulder flexion and abduction atleast 145 degrees for improved ability to reach Baseline:  Goal status: MET 03/06/2022 3.  Pts. Quick dash will be no greater than 20% to demonstrate improved function Baseline:  Goal status: MET  03/15/2022 4.  Pt will be able to resume usual household activities with Right UE Baseline:  Goal status: MET 03/15/2022      PLAN: PT FREQUENCY: 2x/week  PT DURATION: 6 weeks  PLANNED INTERVENTIONS: Therapeutic exercises, Therapeutic activity, Neuromuscular re-education, Patient/Family education, Joint mobilization, Orthotic/Fit training, Dry Needling, Manual lymph drainage, and Manual therapy  PLAN FOR NEXT SESSION: Add standing scap retraction and ext to HEP, STM to right upper quarter, PROM, review HEP, check again for signs of cording to right axilla, progress to strength as tolerated  PHYSICAL THERAPY DISCHARGE SUMMARY  Visits from Start of Care: 8  Current functional level related to goals / functional outcomes: Achieved all goals   Remaining deficits: None   Education / Equipment: theraband   Patient agrees to discharge. Patient goals were met. Patient is being discharged due to meeting the stated rehab goals.  Claris Pong,  PT 03/15/2022, 3:12 PM

## 2022-03-20 ENCOUNTER — Ambulatory Visit: Payer: Medicare PPO

## 2022-03-22 ENCOUNTER — Ambulatory Visit: Payer: Medicare PPO

## 2022-03-27 ENCOUNTER — Ambulatory Visit: Payer: Medicare PPO

## 2022-04-11 ENCOUNTER — Telehealth (HOSPITAL_BASED_OUTPATIENT_CLINIC_OR_DEPARTMENT_OTHER): Payer: Self-pay | Admitting: Cardiovascular Disease

## 2022-04-11 DIAGNOSIS — I25118 Atherosclerotic heart disease of native coronary artery with other forms of angina pectoris: Secondary | ICD-10-CM

## 2022-04-11 MED ORDER — LISINOPRIL 40 MG PO TABS: 40.0000 mg | ORAL_TABLET | Freq: Every day | ORAL | 2 refills | Status: DC

## 2022-04-11 NOTE — Telephone Encounter (Signed)
*  STAT* If patient is at the pharmacy, call can be transferred to refill team.   1. Which medications need to be refilled? (please list name of each medication and dose if known) lisinopril (ZESTRIL) 40 MG tablet  2. Which pharmacy/location (including street and city if local pharmacy) is medication to be sent to? [place], [place]  3. Do they need a 30 day or 90 day supply? 90 day supply    Prescription was sent to the wrong pharmacy. Patient is running low on medication.

## 2022-04-11 NOTE — Telephone Encounter (Signed)
Rx(s) sent to pharmacy electronically.  

## 2022-04-14 DIAGNOSIS — R21 Rash and other nonspecific skin eruption: Secondary | ICD-10-CM | POA: Diagnosis not present

## 2022-04-17 DIAGNOSIS — R21 Rash and other nonspecific skin eruption: Secondary | ICD-10-CM | POA: Diagnosis not present

## 2022-04-25 DIAGNOSIS — I10 Essential (primary) hypertension: Secondary | ICD-10-CM | POA: Diagnosis not present

## 2022-04-25 DIAGNOSIS — E538 Deficiency of other specified B group vitamins: Secondary | ICD-10-CM | POA: Diagnosis not present

## 2022-04-25 DIAGNOSIS — E559 Vitamin D deficiency, unspecified: Secondary | ICD-10-CM | POA: Diagnosis not present

## 2022-04-25 DIAGNOSIS — E039 Hypothyroidism, unspecified: Secondary | ICD-10-CM | POA: Diagnosis not present

## 2022-04-25 DIAGNOSIS — R5382 Chronic fatigue, unspecified: Secondary | ICD-10-CM | POA: Diagnosis not present

## 2022-04-25 DIAGNOSIS — M810 Age-related osteoporosis without current pathological fracture: Secondary | ICD-10-CM | POA: Diagnosis not present

## 2022-05-12 ENCOUNTER — Encounter (HOSPITAL_COMMUNITY): Payer: Self-pay

## 2022-05-12 ENCOUNTER — Emergency Department (HOSPITAL_BASED_OUTPATIENT_CLINIC_OR_DEPARTMENT_OTHER): Payer: Medicare PPO

## 2022-05-12 ENCOUNTER — Inpatient Hospital Stay (HOSPITAL_COMMUNITY): Payer: Medicare PPO

## 2022-05-12 ENCOUNTER — Inpatient Hospital Stay (HOSPITAL_BASED_OUTPATIENT_CLINIC_OR_DEPARTMENT_OTHER)
Admission: EM | Admit: 2022-05-12 | Discharge: 2022-05-17 | DRG: 041 | Disposition: A | Discharge status: Skilled Nursing Facility | Payer: Medicare PPO | Attending: Neurology | Admitting: Neurology

## 2022-05-12 ENCOUNTER — Encounter (HOSPITAL_BASED_OUTPATIENT_CLINIC_OR_DEPARTMENT_OTHER): Payer: Self-pay | Admitting: Emergency Medicine

## 2022-05-12 DIAGNOSIS — I493 Ventricular premature depolarization: Secondary | ICD-10-CM | POA: Diagnosis present

## 2022-05-12 DIAGNOSIS — R1310 Dysphagia, unspecified: Secondary | ICD-10-CM | POA: Diagnosis present

## 2022-05-12 DIAGNOSIS — M6281 Muscle weakness (generalized): Secondary | ICD-10-CM | POA: Diagnosis not present

## 2022-05-12 DIAGNOSIS — E7800 Pure hypercholesterolemia, unspecified: Secondary | ICD-10-CM | POA: Diagnosis present

## 2022-05-12 DIAGNOSIS — Z91040 Latex allergy status: Secondary | ICD-10-CM

## 2022-05-12 DIAGNOSIS — Z7982 Long term (current) use of aspirin: Secondary | ICD-10-CM | POA: Diagnosis not present

## 2022-05-12 DIAGNOSIS — Z79899 Other long term (current) drug therapy: Secondary | ICD-10-CM | POA: Diagnosis not present

## 2022-05-12 DIAGNOSIS — G8191 Hemiplegia, unspecified affecting right dominant side: Secondary | ICD-10-CM | POA: Diagnosis present

## 2022-05-12 DIAGNOSIS — I10 Essential (primary) hypertension: Secondary | ICD-10-CM | POA: Diagnosis not present

## 2022-05-12 DIAGNOSIS — Z86718 Personal history of other venous thrombosis and embolism: Secondary | ICD-10-CM | POA: Diagnosis not present

## 2022-05-12 DIAGNOSIS — M810 Age-related osteoporosis without current pathological fracture: Secondary | ICD-10-CM | POA: Diagnosis not present

## 2022-05-12 DIAGNOSIS — I6523 Occlusion and stenosis of bilateral carotid arteries: Secondary | ICD-10-CM | POA: Diagnosis not present

## 2022-05-12 DIAGNOSIS — R29818 Other symptoms and signs involving the nervous system: Secondary | ICD-10-CM | POA: Diagnosis not present

## 2022-05-12 DIAGNOSIS — R29810 Facial weakness: Secondary | ICD-10-CM | POA: Diagnosis not present

## 2022-05-12 DIAGNOSIS — R531 Weakness: Secondary | ICD-10-CM | POA: Diagnosis not present

## 2022-05-12 DIAGNOSIS — R471 Dysarthria and anarthria: Secondary | ICD-10-CM | POA: Diagnosis present

## 2022-05-12 DIAGNOSIS — I491 Atrial premature depolarization: Secondary | ICD-10-CM | POA: Diagnosis present

## 2022-05-12 DIAGNOSIS — Z923 Personal history of irradiation: Secondary | ICD-10-CM | POA: Diagnosis not present

## 2022-05-12 DIAGNOSIS — C50411 Malignant neoplasm of upper-outer quadrant of right female breast: Secondary | ICD-10-CM | POA: Diagnosis not present

## 2022-05-12 DIAGNOSIS — I6782 Cerebral ischemia: Secondary | ICD-10-CM | POA: Diagnosis not present

## 2022-05-12 DIAGNOSIS — Z471 Aftercare following joint replacement surgery: Secondary | ICD-10-CM | POA: Diagnosis not present

## 2022-05-12 DIAGNOSIS — I6389 Other cerebral infarction: Principal | ICD-10-CM | POA: Diagnosis present

## 2022-05-12 DIAGNOSIS — Z853 Personal history of malignant neoplasm of breast: Secondary | ICD-10-CM

## 2022-05-12 DIAGNOSIS — Z823 Family history of stroke: Secondary | ICD-10-CM | POA: Diagnosis not present

## 2022-05-12 DIAGNOSIS — R4781 Slurred speech: Secondary | ICD-10-CM | POA: Diagnosis not present

## 2022-05-12 DIAGNOSIS — R2689 Other abnormalities of gait and mobility: Secondary | ICD-10-CM | POA: Diagnosis not present

## 2022-05-12 DIAGNOSIS — Z8249 Family history of ischemic heart disease and other diseases of the circulatory system: Secondary | ICD-10-CM

## 2022-05-12 DIAGNOSIS — G319 Degenerative disease of nervous system, unspecified: Secondary | ICD-10-CM | POA: Diagnosis not present

## 2022-05-12 DIAGNOSIS — R29704 NIHSS score 4: Secondary | ICD-10-CM | POA: Diagnosis present

## 2022-05-12 DIAGNOSIS — Z888 Allergy status to other drugs, medicaments and biological substances status: Secondary | ICD-10-CM

## 2022-05-12 DIAGNOSIS — Z79890 Hormone replacement therapy (postmenopausal): Secondary | ICD-10-CM | POA: Diagnosis not present

## 2022-05-12 DIAGNOSIS — Z881 Allergy status to other antibiotic agents status: Secondary | ICD-10-CM | POA: Diagnosis not present

## 2022-05-12 DIAGNOSIS — I6529 Occlusion and stenosis of unspecified carotid artery: Secondary | ICD-10-CM | POA: Diagnosis not present

## 2022-05-12 DIAGNOSIS — R29898 Other symptoms and signs involving the musculoskeletal system: Secondary | ICD-10-CM | POA: Diagnosis not present

## 2022-05-12 DIAGNOSIS — I639 Cerebral infarction, unspecified: Secondary | ICD-10-CM | POA: Diagnosis not present

## 2022-05-12 DIAGNOSIS — I2510 Atherosclerotic heart disease of native coronary artery without angina pectoris: Secondary | ICD-10-CM | POA: Diagnosis present

## 2022-05-12 DIAGNOSIS — R1312 Dysphagia, oropharyngeal phase: Secondary | ICD-10-CM | POA: Diagnosis not present

## 2022-05-12 DIAGNOSIS — Z7401 Bed confinement status: Secondary | ICD-10-CM | POA: Diagnosis not present

## 2022-05-12 LAB — DIFFERENTIAL
Abs Immature Granulocytes: 0 10*3/uL (ref 0.00–0.07)
Basophils Absolute: 0.1 10*3/uL (ref 0.0–0.1)
Basophils Relative: 2 %
Eosinophils Absolute: 0.3 10*3/uL (ref 0.0–0.5)
Eosinophils Relative: 8 %
Immature Granulocytes: 0 %
Lymphocytes Relative: 22 %
Lymphs Abs: 0.9 10*3/uL (ref 0.7–4.0)
Monocytes Absolute: 0.4 10*3/uL (ref 0.1–1.0)
Monocytes Relative: 10 %
Neutro Abs: 2.5 10*3/uL (ref 1.7–7.7)
Neutrophils Relative %: 58 %

## 2022-05-12 LAB — URINALYSIS, ROUTINE W REFLEX MICROSCOPIC
Bilirubin Urine: NEGATIVE
Glucose, UA: NEGATIVE mg/dL
Hgb urine dipstick: NEGATIVE
Ketones, ur: 5 mg/dL — AB
Leukocytes,Ua: NEGATIVE
Nitrite: NEGATIVE
Protein, ur: NEGATIVE mg/dL
Specific Gravity, Urine: 1.02 (ref 1.005–1.030)
pH: 7 (ref 5.0–8.0)

## 2022-05-12 LAB — CBC
HCT: 39.8 % (ref 36.0–46.0)
Hemoglobin: 13.7 g/dL (ref 12.0–15.0)
MCH: 34.4 pg — ABNORMAL HIGH (ref 26.0–34.0)
MCHC: 34.4 g/dL (ref 30.0–36.0)
MCV: 100 fL (ref 80.0–100.0)
Platelets: 378 10*3/uL (ref 150–400)
RBC: 3.98 MIL/uL (ref 3.87–5.11)
RDW: 12.3 % (ref 11.5–15.5)
WBC: 4.2 10*3/uL (ref 4.0–10.5)
nRBC: 0 % (ref 0.0–0.2)

## 2022-05-12 LAB — APTT: aPTT: 32 seconds (ref 24–36)

## 2022-05-12 LAB — COMPREHENSIVE METABOLIC PANEL
ALT: 17 U/L (ref 0–44)
AST: 24 U/L (ref 15–41)
Albumin: 4.6 g/dL (ref 3.5–5.0)
Alkaline Phosphatase: 90 U/L (ref 38–126)
Anion gap: 10 (ref 5–15)
BUN: 10 mg/dL (ref 8–23)
CO2: 23 mmol/L (ref 22–32)
Calcium: 9.6 mg/dL (ref 8.9–10.3)
Chloride: 102 mmol/L (ref 98–111)
Creatinine, Ser: 0.77 mg/dL (ref 0.44–1.00)
GFR, Estimated: >60 mL/min (ref >60)
Glucose, Bld: 94 mg/dL (ref 70–99)
Potassium: 3.7 mmol/L (ref 3.5–5.1)
Sodium: 135 mmol/L (ref 135–145)
Total Bilirubin: 0.5 mg/dL (ref 0.3–1.2)
Total Protein: 7.6 g/dL (ref 6.5–8.1)

## 2022-05-12 LAB — RAPID URINE DRUG SCREEN, HOSP PERFORMED
Amphetamines: NOT DETECTED
Barbiturates: NOT DETECTED
Benzodiazepines: NOT DETECTED
Cocaine: NOT DETECTED
Opiates: NOT DETECTED
Tetrahydrocannabinol: NOT DETECTED

## 2022-05-12 LAB — ETHANOL: Alcohol, Ethyl (B): <10 mg/dL (ref <10)

## 2022-05-12 LAB — PROTIME-INR
INR: 1 (ref 0.8–1.2)
Prothrombin Time: 13.2 seconds (ref 11.4–15.2)

## 2022-05-12 LAB — CBG MONITORING, ED: Glucose-Capillary: 93 mg/dL (ref 70–99)

## 2022-05-12 MED ORDER — SODIUM CHLORIDE 0.9 % IV SOLN
INTRAVENOUS | Status: DC
Start: 2022-05-12 — End: 2022-05-14

## 2022-05-12 MED ORDER — TENECTEPLASE FOR STROKE
0.2500 mg/kg | PACK | Freq: Once | INTRAVENOUS | Status: AC
  Administered 2022-05-12: 14 mg via INTRAVENOUS

## 2022-05-12 MED ORDER — CLEVIDIPINE BUTYRATE 0.5 MG/ML IV EMUL
INTRAVENOUS | Status: AC
  Filled 2022-05-12: qty 100

## 2022-05-12 MED ORDER — TENECTEPLASE FOR STROKE
PACK | INTRAVENOUS | Status: AC
  Filled 2022-05-12: qty 10

## 2022-05-12 MED ORDER — ACETAMINOPHEN 10 MG/ML IV SOLN
1000.0000 mg | Freq: Once | INTRAVENOUS | Status: AC
  Administered 2022-05-12: 1000 mg via INTRAVENOUS
  Filled 2022-05-12: qty 100

## 2022-05-12 MED ORDER — ACETAMINOPHEN 650 MG RE SUPP
325.0000 mg | RECTAL | Status: DC | PRN
Start: 2022-05-12 — End: 2022-05-17

## 2022-05-12 MED ORDER — ORAL CARE MOUTH RINSE: 15.0000 mL | OROMUCOSAL | Status: DC | PRN

## 2022-05-12 MED ORDER — CLEVIDIPINE BUTYRATE 0.5 MG/ML IV EMUL
0.0000 mg/h | INTRAVENOUS | Status: DC
  Administered 2022-05-12 (×2): 2 mg/h via INTRAVENOUS

## 2022-05-12 MED ORDER — ACETAMINOPHEN 10 MG/ML IV SOLN
1000.0000 mg | Freq: Four times a day (QID) | INTRAVENOUS | Status: DC
Start: 2022-05-13 — End: 2022-05-12

## 2022-05-12 MED ORDER — IOHEXOL 350 MG/ML SOLN
100.0000 mL | Freq: Once | INTRAVENOUS | Status: AC | PRN
  Administered 2022-05-12: 75 mL via INTRAVENOUS

## 2022-05-12 NOTE — Progress Notes (Signed)
Pt arrived to [facility] with glasses; shoes, socks, mail/receipts, jeans, shirt, cash ($54), coins, phone.

## 2022-05-12 NOTE — Consult Note (Signed)
cart activated at 1542. paged sent at 1547. Left for CT at 1548. Dr Leonel Ramsay with neuro on cam at 1550. returned from ct at 1554. TNK decision at 1604. BP 180/87-neuro wanted cleviprex started. unable to locate scale-neuro ok to use last weight reported at PCP office of 128 lbs/58.1kg. 2nd IV placed at 1606. cleviprex started at 1609. TNK bolus at 1611. Back to CT for CTA at 1617. Returned at 1627. Re-paged Dr Leonel Ramsay with CTA results at 1644. Off camera at [place].  Biagio Borg, Telestroke RN

## 2022-05-12 NOTE — Consult Note (Signed)
[facility] Telemedicine Consult   Requesting Provider: Gloris [place] Consult Participants: Patient, bedside nurse, husband   This consult was provided via telemedicine with 2-way video and audio communication. The patient/family was informed that care would be provided in this way and agreed to receive care in this manner.    Chief Complaint: Right sided weakness  HPI: 71 yo F with a history of hypertension, hyperlipidemia, who presents with acute right sided weakness and difficulty speaking earlier.  She states that it started around 2:10 PM, last known well at 2 PM.  She states that she was riding, and suddenly was unable to use her hand to write, unsure if it was a language or motor issue but states that she just could not form the numbers.  She was having difficulty talking as well which lasted about 30 minutes and this improved, but she has persistent right-sided weakness and numbness and difficulty walking.    LKW: 2 PM Tnk given?:  Yes IR Thrombectomy? No, no LVO signs Modified Rankin Scale: 0-Completely asymptomatic and back to baseline post- stroke Time of teleneurologist evaluation: 15:49  Exam: Vitals:   05/12/22 1535  BP: (!) 177/79  Pulse: 62  Resp: 20  Temp: 98.3 F (36.8 C)  SpO2: 98%    General: in bed, NAD  1A: Level of Consciousness - 0 1B: Ask Month and Age - 0 1C: 'Blink Eyes' & 'Squeeze Hands' - 0 2: Test Horizontal Extraocular Movements - 0 3: Test Visual Fields - 0 4: Test Facial Palsy - 1 5A: Test Left Arm Motor Drift - 0 5B: Test Right Arm Motor Drift - 1 6A: Test Left Leg Motor Drift - 0 6B: Test Right Leg Motor Drift - 1 7: Test Limb Ataxia - 0 8: Test Sensation - 1 9: Test Language/Aphasia- 0 10: Test Dysarthria - 0 11: Test Extinction/Inattention - 0 NIHSS score: 4   Imaging Reviewed: CT head-no hemorrhage  Labs reviewed in epic and pertinent values follow: Blood glucose 93   Assessment: 71 year old female who presents  with cute onset right-sided weakness and difficulty speaking with persistent weakness and difficulty walking.  Given the weakness and difficulty walking, I do feel this would be a disabling deficit and therefore I discussed the risks and benefits of IV tenecteplase with the patient who expressed a desire to proceed with the therapy.  Recommendations:  -Admit to neuro ICU - HgbA1c, fasting lipid panel - MRI of the brain without contrast - Frequent neuro checks - Echocardiogram - CTA head and neck - Prophylactic therapy-none for 24 hours - Risk factor modification - Telemetry monitoring - PT consult, OT consult, Speech consult    This patient is critically ill and at significant risk of neurological worsening, death and care requires constant monitoring of vital signs, hemodynamics,respiratory and cardiac monitoring, neurological assessment, discussion with family, other specialists and medical decision making of high complexity. I spent 35 minutes of neurocritical care time  in the care of  this patient. This was time spent independent of any time provided by nurse practitioner or PA.  Roland Rack, MD [facility] [phone]  If 7pm- 7am, please page neurology on call as listed in [place]. 05/12/2022  4:21 PM   Roland Rack, MD [facility] [phone]  If 7pm- 7am, please page neurology on call as listed in [place].

## 2022-05-12 NOTE — ED Triage Notes (Signed)
Pt  here from home with c/o right arm numbness and leg weakness and was unable to write or talk , this all happened around 2 pm , lasted about 15 mins , pt still has some mild weakness and decreased sensation to the right side

## 2022-05-12 NOTE — ED Notes (Addendum)
Dr. Leonel Ramsay notified of pt worsening neurological exam. Verbal order for CT head given.

## 2022-05-12 NOTE — ED Provider Triage Note (Signed)
Emergency Medicine Provider Triage Evaluation Note  Angela Buchanan , a 71 y.o. female  was evaluated in triage.  Pt complains of sudden onset right-sided weakness, aphasia, and inability to write at 1400.  Was reading banking information when she suddenly was unable to recall how to write numbers and was not speaking clearly per the husband.  Also felt right-sided facial numbness and apparent droop.  Felt weak in the right arm and leg, was unable to walk.  Aphasia and writing disability has resolved, now with right-sided headache.  Denies vision changes, fever, recent illness, history of known AVMs, prior CVA.  Not on anticoagulation other than bASA daily.  Takes her blood pressure medications regularly.  Review of Systems  Positive:  Negative: See above  Physical Exam  BP (!) 177/79 (BP Location: Right Arm)   Pulse 62   Temp 98.3 F (36.8 C)   Resp 20   LMP  (LMP Unknown)   SpO2 98%  Gen:   Awake, no distress   Resp:  Normal effort, CTAB Neuro:  Decreased sensation of V1 V2 V3 of right side.  Normal EOMs.  Negative pronator drift.  Mild facial asymmetry, right-sided droop.  Weakness of right upper and lower extremity.  Decreased sensation of right upper arm.  CN IX, X, XI appears grossly intact.  No aphasia or dysarthria appreciated. Other:  Abdomen soft nontender.  Medical Decision Making  Medically screening exam initiated at 3:36 PM.  Appropriate orders placed.  Angela Buchanan was informed that the remainder of the evaluation will be completed by another provider, this initial triage assessment does not replace that evaluation, and the importance of remaining in the ED until their evaluation is complete.  Patient onset less than 2 hours ago, code stroke initiated.  Attending informed.   Prince Rome, PA-C 62/22/97 [id number]

## 2022-05-12 NOTE — H&P (Signed)
Admission H&P    Chief Complaint: Acute onset of right sided weakness, facial droop and dysarthria  HPI: Angela Buchanan is an 71 y.o. female with a history of hypertension, HLD, atypical CP, ventricular bigeminy/PAC/PVC, right breast cancer and carotid bruit, who presented to [facility] earlier today with acute right sided weakness and difficulty speaking.  She stated that it started around 2:10 PM, last known well at 2 PM.  She stated that she was writing and suddenly was unable to use her right hand to write. She was unsure if it was a language or motor issue but stated that she just could not form the numbers. She was having difficulty talking as well which lasted about 30 minutes before improving. She did have persistent mild right-sided weakness and numbness with difficulty walking, however, which resulted in the decision to be seen in the ED, where a Code Stroke was called.   The patient was evaluated by Teleneurology with cart activated at 1542. Patient's NIHSS was 4. Decision to administer TNK was made at 1604. Cleviprex was initiated for BP of 180/87 just prior to administration of TNK. The patient was then sent to [facility] for post-TNK management and stroke work up.   Past Medical History:  Diagnosis Date   Atypical chest pain 07/19/2021   Bigeminy    no current med.   Breast cancer ([place]) 06/2017   right   Bruises easily    Carotid bruit 11/13/2019   Dental crowns present    History of diverticulitis    Hypertension    states under control with med., has been on med. x 5 yr.   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    Past Surgical History:  Procedure Laterality Date   ABDOMINAL HYSTERECTOMY     partial   BREAST LUMPECTOMY Right 06/22/2017    Malignant   BREAST LUMPECTOMY WITH RADIOACTIVE SEED AND SENTINEL LYMPH NODE BIOPSY Right 06/22/2017   Procedure: RIGHT BREAST LUMPECTOMY WITH RADIOACTIVE SEED AND RIGHT AXILLARY DEEP SENTINEL LYMPH NODE BIOPSY WITH BLUE DYE INJECTION;  Surgeon: Fanny Skates, MD;  Location: [place];  Service: General;  Laterality: Right;   BREAST LUMPECTOMY WITH RADIOACTIVE SEED LOCALIZATION Left 06/22/2017   Procedure: LEFT BREAST LUMPECTOMY WITH RADIOACTIVE SEED LOCALIZATION;  Surgeon: Fanny Skates, MD;  Location: [facility];  Service: General;  Laterality: Left;   CATARACT EXTRACTION W/ INTRAOCULAR LENS  IMPLANT, BILATERAL Bilateral    EXCISION OF BREAST BIOPSY Left 06/22/2017   benign   TONSILLECTOMY     age 71    Family History  Problem Relation Age of Onset   Hypertension Sister    Dementia Father    Stroke Mother    Stroke Maternal Grandmother    Stroke Maternal Grandfather    Stroke Paternal Grandmother    Stroke Paternal Grandfather    Stroke Sister    Stroke Brother    Dementia Brother    Heart attack Brother    Social History:  reports that she has never smoked. She has never used smokeless tobacco. She reports that she does not drink alcohol and does not use drugs.  Allergies:  Allergies  Allergen Reactions   Atenolol Other (See Comments)    CHEST PAIN   Bisoprolol Other (  See Comments)    "FEELS WEIRD"   Prednisone Other (See Comments)    UNABLE TO FOCUS   Amoxicillin-Pot Clavulanate Diarrhea and Other (See Comments)   Hctz [Hydrochlorothiazide]     Hyponatremia    Rosuvastatin     myalgias   Latex Rash    Medications Prior to Admission  Medication Sig Dispense Refill   ALPRAZolam (XANAX) 0.25 MG tablet 1 tablet     amLODipine (NORVASC) 5 MG tablet Take 1 tablet (5 mg total) by mouth daily. 90 tablet 3   anastrozole (ARIMIDEX) 1 MG tablet Take 1 tablet (1 mg total) by mouth daily. 90 tablet 3   aspirin EC 81 MG tablet Take 1 tablet  (81 mg total) by mouth daily.     b complex vitamins tablet Take 1 tablet by mouth daily.     Biotin 1000 MCG tablet Take 1,000 mcg by mouth daily.     Calcium-Magnesium-Vitamin D 600-300-400 LIQD Take 1 tablet by mouth daily.     cholecalciferol (VITAMIN D) 1000 units tablet Take 2,000 Units by mouth daily.     citalopram (CELEXA) 20 MG tablet Take 1 tablet by mouth daily at 12 noon.     clobetasol ointment (TEMOVATE) 2.44 % 1 application to affected area     Cyanocobalamin (VITAMIN B12) 1000 MCG TBCR Take 1 tablet by mouth daily at 12 noon.     diclofenac Sodium (VOLTAREN) 1 % GEL Apply 2 g topically 4 (four) times daily as needed. 50 g 1   fluticasone (FLONASE) 50 MCG/ACT nasal spray      ibuprofen (ADVIL) 800 MG tablet TAKE 1 TABLET BY MOUTH THREE TIMES DAILY WITH MEALS FOR 14 DAYS     levocetirizine (XYZAL) 5 MG tablet levocetirizine 5 mg tablet  TAKE 1 TABLET BY MOUTH IN THE EVENING     levothyroxine (SYNTHROID) 25 MCG tablet levothyroxine 25 mcg tablet  TAKE 1 TABLET BY MOUTH EVERY DAY     lisinopril (ZESTRIL) 40 MG tablet Take 1 tablet (40 mg total) by mouth daily. 90 tablet 2   meloxicam (MOBIC) 15 MG tablet meloxicam 15 mg tablet     metoprolol succinate (TOPROL-XL) 25 MG 24 hr tablet Take 1 tablet (25 mg total) by mouth daily. 90 tablet 3   montelukast (SINGULAIR) 10 MG tablet Take 10 mg by mouth at bedtime.      Multiple Vitamin (MULTIVITAMIN WITH MINERALS) TABS tablet Take 1 tablet by mouth daily.     nitroGLYCERIN (NITROSTAT) 0.4 MG SL tablet Place 1 tablet (0.4 mg total) under the tongue every 5 (five) minutes as needed for chest pain. 25 tablet PRN   pravastatin (PRAVACHOL) 10 MG tablet Take 1 tablet (10 mg total) by mouth daily. 90 tablet 3   traMADol (ULTRAM) 50 MG tablet Take 0.5-1 tablets (25-50 mg total) by mouth every 6 (six) hours as needed. 30 tablet 0    ROS: As per HPI.   Physical Examination: Blood pressure 132/76, pulse 71, temperature 98.3 F (36.8 C), resp.  rate 16, height '5\' 2"'$  (1.575 m), weight 54.8 kg, SpO2 97 %.  HEENT-  [place]/AT  Lungs - Respirations unlabored Extremities - No edema  Neurologic Examination: Ment: Awake and alert. Oriented x 5. Speech is dysarthric but fluent with intact comprehension and naming. No hemineglect.  CN: PERRL. Temporal visual fields intact without extinction to DSS. EOMI. Sensation to temp intact bilaterally. Right facial droop. Phonation intact.  Motor:  LUE and LLE 5/5 RUE 2-3/5 proximally and  distally. Requires significant effort and coaching to elevate arm above bed.  RLE 2/5 proximally, 3/5 distally Sensory: Decreased temp sensation to RUE and RLE. Normal sensation to LUE and LLE.  Reflexes: 2+ bilateral brachioradialis and patellars Cerebellar: No ataxia on the left. Unable to perform on the right Gait: Unable to assess  Results for orders placed or performed during the hospital encounter of 05/12/22 (from the past 48 hour(s))  CBG monitoring, ED     Status: None   Collection Time: 05/12/22  3:43 PM  Result Value Ref Range   Glucose-Capillary 93 70 - 99 mg/dL    Comment: Glucose reference range applies only to samples taken after fasting for at least 8 hours.  Ethanol     Status: None   Collection Time: 05/12/22  3:45 PM  Result Value Ref Range   Alcohol, Ethyl (B) <10 <10 mg/dL    Comment: (NOTE) Lowest detectable limit for serum alcohol is 10 mg/dL.  For medical purposes only. Performed at [facility], [address] [place], [place] [address]   Protime-INR     Status: None   Collection Time: 05/12/22  3:45 PM  Result Value Ref Range   Prothrombin Time 13.2 11.4 - 15.2 seconds   INR 1.0 0.8 - 1.2    Comment: (NOTE) INR goal varies based on device and disease states. Performed at [facility], [address] [place], [place] [address]   APTT     Status: None   Collection Time: 05/12/22  3:45 PM  Result Value Ref Range   aPTT 32 24 - 36  seconds    Comment: Performed at [facility], [address] [place], [place] [address]  CBC     Status: Abnormal   Collection Time: 05/12/22  3:45 PM  Result Value Ref Range   WBC 4.2 4.0 - 10.5 K/uL   RBC 3.98 3.87 - 5.11 MIL/uL   Hemoglobin 13.7 12.0 - 15.0 g/dL   HCT 39.8 36.0 - 46.0 %   MCV 100.0 80.0 - 100.0 fL   MCH 34.4 (H) 26.0 - 34.0 pg   MCHC 34.4 30.0 - 36.0 g/dL   RDW 12.3 11.5 - 15.5 %   Platelets 378 150 - 400 K/uL   nRBC 0.0 0.0 - 0.2 %    Comment: Performed at [facility], [address], [place], [place] [address]  Differential     Status: None   Collection Time: 05/12/22  3:45 PM  Result Value Ref Range   Neutrophils Relative % 58 %   Neutro Abs 2.5 1.7 - 7.7 K/uL   Lymphocytes Relative 22 %   Lymphs Abs 0.9 0.7 - 4.0 K/uL   Monocytes Relative 10 %   Monocytes Absolute 0.4 0.1 - 1.0 K/uL   Eosinophils Relative 8 %   Eosinophils Absolute 0.3 0.0 - 0.5 K/uL   Basophils Relative 2 %   Basophils Absolute 0.1 0.0 - 0.1 K/uL   Immature Granulocytes 0 %   Abs Immature Granulocytes 0.00 0.00 - 0.07 K/uL    Comment: Performed at [facility], [address], [place], [place] [address]  Comprehensive metabolic panel     Status: None   Collection Time: 05/12/22  3:45 PM  Result Value Ref Range   Sodium 135 135 - 145 mmol/L   Potassium 3.7 3.5 - 5.1 mmol/L   Chloride 102 98 - 111 mmol/L   CO2 23 22 - 32 mmol/L   Glucose, Bld 94  70 - 99 mg/dL    Comment: Glucose reference range applies only to samples taken after fasting for at least 8 hours.   BUN 10 8 - 23 mg/dL   Creatinine, Ser 0.77 0.44 - 1.00 mg/dL   Calcium 9.6 8.9 - 10.3 mg/dL   Total Protein 7.6 6.5 - 8.1 g/dL   Albumin 4.6 3.5 - 5.0 g/dL   AST 24 15 - 41 U/L   ALT 17 0 - 44 U/L   Alkaline Phosphatase 90 38 - 126 U/L   Total Bilirubin 0.5 0.3 - 1.2 mg/dL   GFR, Estimated >60 >60 mL/min    Comment: (NOTE) Calculated using the CKD-EPI  Creatinine Equation (2021)    Anion gap 10 5 - 15    Comment: Performed at [facility], [address], [place], [place] [address]   CT Head Wo Contrast  Result Date: 05/12/2022 CLINICAL DATA:  Worsening weakness on the left. Slurred speech. Neuro deficit. EXAM: CT HEAD WITHOUT CONTRAST TECHNIQUE: Contiguous axial images were obtained from the base of the skull through the vertex without intravenous contrast. RADIATION DOSE REDUCTION: This exam was performed according to the departmental dose-optimization program which includes automated exposure control, adjustment of the mA and/or kV according to patient size and/or use of iterative reconstruction technique. COMPARISON:  Noncontrast CT of the brain May 12, 2022. CT angiogram of the brain May 12, 2022. FINDINGS: Brain: Significant contrast remains in the vessels and dural sinuses. White matter changes are stable and moderate. No acute cortical ischemia or infarct. Ventricles and sulci are stable. Cerebellum, brainstem, and basal cisterns are stable. No evidence of subdural, epidural, or subarachnoid hemorrhage. Vascular: Calcified atherosclerotic changes are identified in the intracranial carotids. Skull: Normal. Negative for fracture or focal lesion. Sinuses/Orbits: No acute finding. Other: None. IMPRESSION: No acute intracranial abnormality identified. No significant change since the noncontrast CT scan earlier today. Electronically Signed   By: Dorise Bullion III M.D.   On: 05/12/2022 17:44   CT ANGIO HEAD NECK W WO CM (CODE STROKE)  Result Date: 05/12/2022 CLINICAL DATA:  Neuro deficit, acute, stroke suspected EXAM: CT ANGIOGRAPHY HEAD AND NECK TECHNIQUE: Multidetector CT imaging of the head and neck was performed using the standard protocol during bolus administration of intravenous contrast. Multiplanar CT image reconstructions and MIPs were obtained to evaluate the vascular anatomy. Carotid stenosis measurements (when  applicable) are obtained utilizing NASCET criteria, using the distal internal carotid diameter as the denominator. RADIATION DOSE REDUCTION: This exam was performed according to the departmental dose-optimization program which includes automated exposure control, adjustment of the mA and/or kV according to patient size and/or use of iterative reconstruction technique. CONTRAST:  68m OMNIPAQUE IOHEXOL 350 MG/ML SOLN COMPARISON:  None Available. FINDINGS: CTA NECK Aortic arch: Unremarkable.  Great vessel origins are patent. Right carotid system: Patent.  No stenosis. Left carotid system: Patent.  No stenosis. Vertebral arteries: Patent and codominant. No stenosis or evidence of dissection. Skeleton: Mild cervical spine degenerative changes. Other neck: Right thyroid nodule previously evaluated by ultrasound. Upper chest: No apical lung mass. Review of the MIP images confirms the above findings CTA HEAD Anterior circulation: Intracranial internal carotid arteries are patent with minimal calcified plaque. Anterior cerebral arteries are patent. Middle cerebral arteries are patent. Posterior circulation: Intracranial vertebral arteries patent. Basilar artery is patent. Major cerebellar artery origins are patent. Left posterior communicating artery is present. Posterior cerebral arteries are patent with fetal origin on. Moderate stenosis at the left P1-P2 junction. Venous sinuses:  Patent as allowed by contrast bolus timing. Review of the MIP images confirms the above findings IMPRESSION: No large vessel occlusion, hemodynamically significant stenosis, or evidence of dissection. Moderate stenosis left P1-P2 PCA junction. Electronically Signed   By: Macy Mis M.D.   On: 05/12/2022 16:39   CT HEAD CODE STROKE WO CONTRAST  Result Date: 05/12/2022 CLINICAL DATA:  Code stroke. Neuro deficit, acute, stroke suspected. Right arm numbness and leg weakness with speech disturbance. EXAM: CT HEAD WITHOUT CONTRAST TECHNIQUE:  Contiguous axial images were obtained from the base of the skull through the vertex without intravenous contrast. RADIATION DOSE REDUCTION: This exam was performed according to the departmental dose-optimization program which includes automated exposure control, adjustment of the mA and/or kV according to patient size and/or use of iterative reconstruction technique. COMPARISON:  None Available. FINDINGS: Brain: There is no evidence of an acute infarct, intracranial hemorrhage, mass, midline shift, or extra-axial fluid collection. Mild prominence of the lateral ventricles is favored to reflect central predominant cerebral atrophy. Patchy hypodensities in the cerebral white matter bilaterally are nonspecific but compatible with moderate chronic small vessel ischemic disease. Vascular: Calcified atherosclerosis at the skull base. No hyperdense vessel. Skull: No fracture or suspicious osseous lesion. Sinuses/Orbits: Visualized paranasal sinuses and mastoid air cells are clear. Visualized orbits are unremarkable. Other: None. ASPECTS [facility] Stroke Program Early CT Score) - Ganglionic level infarction (caudate, lentiform nuclei, internal capsule, insula, M1-M3 cortex): 7 - Supraganglionic infarction (M4-M6 cortex): 3 Total score (0-10 with 10 being normal): 10 IMPRESSION: 1. No evidence of acute intracranial abnormality. ASPECTS of 10. 2. Moderate chronic small vessel ischemic disease. These results were called by telephone at the time of interpretation on 05/12/2022 at 4:01 pm to Dr. Fredia Sorrow, who verbally acknowledged these results. Electronically Signed   By: Logan Bores M.D.   On: 05/12/2022 16:03    Assessment: 71 year old female who presented to [facility] this afternoon with acute onset of right-sided weakness, difficulty speaking and difficulty walking. CT head at OSH showed no evidence of acute intracranial abnormality..Given the weakness and difficulty walking, it was felt by Teleneurology that this would  be a disabling deficit. Therefore risks and benefits of IV tenecteplase were discussed with the patient who expressed a desire to proceed with the therapy. CTA of the head and neck was negative for LVO. She was transferred to [facility] for post-TNK management and stroke work up.  - Exam on arrival to [facility] with some increased dysarthria. Repeat CT head was ordered STAT without any hemorrhage or other acute change noted.  - CTA at OSH: No large vessel occlusion, hemodynamically significant stenosis, or evidence of dissection. Moderate stenosis left P1-P2 PCA junction.   Recommendations:  - Admitted to neuro ICU - BP management per post-TNK protocol - HgbA1c, fasting lipid panel - MRI of the brain without contrast - Frequent neuro checks - Echocardiogram - CTA head and neck - Prophylactic therapy-none for 24 hours - Risk factor modification - Telemetry monitoring - PT consult, OT consult, Speech consult   45 minutes spent in the neurological evaluation and management of this critically ill patient.   Electronically signed: Dr. Kerney Elbe 05/12/2022, 6:46 PM

## 2022-05-12 NOTE — Progress Notes (Signed)
   05/12/22 1820 05/12/22 1840  NIH Stroke Scale ( + Modified Stroke Scale Criteria)   Interval  --  Neuro change (MD at bedside; fluctuating)  Level of Consciousness (1a.)    0 0  LOC Questions (1b. )   + 0 0  LOC Commands (1c. )   +  0 0  Best Gaze (2. )  + 0 0  Visual (3. )  + 0 (baseline double blurry) 0  Facial Palsy (4. )     1 2  Motor Arm, Left (5a. )   + 0 0  Motor Arm, Right (5b. )   + 1 3  Motor Leg, Left (6a. )   + 0 0  Motor Leg, Right (6b. )   + 2 3  Limb Ataxia (7. ) 0 0  Sensory (8. )   + 1 1  Best Language (9. )   + 0 0  Dysarthria (10. ) 1 2  Extinction/Inattention (11.)   + 0 0  Modified SS Total  + 4 7  Complete NIHSS TOTAL 6 11    MD at bedside during 1840 assessment; orders for stat head CT. Pt returned from CT; orders for SBP 160-180.

## 2022-05-12 NOTE — ED Provider Notes (Addendum)
[facility] EMERGENCY DEPT Provider Note   CSN: [id number] Arrival date & time: 05/12/22  1525     History  Chief Complaint  Patient presents with   Code Stroke    Angela Buchanan is a 71 y.o. female.  Patient with onset right-sided numbness and weakness upper extremity lower extremity that occurred at 2 PM.  Patient's husband said it may have been 1 PM.  Patient activated as a code stroke upon arrival.  It was pretty severe for about 15 minutes but still has some weakness to the right upper extremity and right lower extremity.  Also when it first happened patient had about 5 or 10 minutes where she could not speak.    Past medical history significant for diverticulitis hypertension premature atrial contractions premature ventricle contractions.  Atypical chest pain.  Past surgical history sniffing for abdominal hysterectomy and a breast lumpectomy in 2018 patient never smoked.       Home Medications Prior to Admission medications   Medication Sig Start Date End Date Taking? Authorizing Provider  ALPRAZolam Duanne Moron) 0.25 MG tablet 1 tablet 05/29/17   [provider]  amLODipine (NORVASC) 5 MG tablet Take 1 tablet (5 mg total) by mouth daily. 12/30/21 12/25/22  Loel Dubonnet, NP  anastrozole (ARIMIDEX) 1 MG tablet Take 1 tablet (1 mg total) by mouth daily. 01/04/22   Gardenia Phlegm, NP  aspirin EC 81 MG tablet Take 1 tablet (81 mg total) by mouth daily. 12/24/18   Nicholas Lose, MD  b complex vitamins tablet Take 1 tablet by mouth daily.    [provider]  Biotin 1000 MCG tablet Take 1,000 mcg by mouth daily.    [provider]  Calcium-Magnesium-Vitamin D 600-300-400 LIQD Take 1 tablet by mouth daily.    [provider]  cholecalciferol (VITAMIN D) 1000 units tablet Take 2,000 Units by mouth daily.    [provider]  citalopram (CELEXA) 20 MG tablet Take 1 tablet by mouth daily at 12 noon. 11/27/14   [provider]  clobetasol ointment (TEMOVATE) 9.32 % 1 application to affected area    [provider]  Cyanocobalamin (VITAMIN B12) 1000 MCG TBCR Take 1 tablet by mouth daily at 12 noon.    [provider]  diclofenac Sodium (VOLTAREN) 1 % GEL Apply 2 g topically 4 (four) times daily as needed. 09/21/20   Skeet Latch, MD  fluticasone Asencion Islam) 50 MCG/ACT nasal spray  02/24/15   [provider]  ibuprofen (ADVIL) 800 MG tablet TAKE 1 TABLET BY MOUTH THREE TIMES DAILY WITH MEALS FOR 14 DAYS 11/12/20   [provider]  levocetirizine (XYZAL) 5 MG tablet levocetirizine 5 mg tablet  TAKE 1 TABLET BY MOUTH IN THE EVENING    [provider]  levothyroxine (SYNTHROID) 25 MCG tablet levothyroxine 25 mcg tablet  TAKE 1 TABLET BY MOUTH EVERY DAY    [provider]  lisinopril (ZESTRIL) 40 MG tablet Take 1 tablet (40 mg total) by mouth daily. 04/11/22   Skeet Latch, MD  meloxicam (MOBIC) 15 MG tablet meloxicam 15 mg tablet    [provider]  metoprolol succinate (TOPROL-XL) 25 MG 24 hr tablet Take 1 tablet (25 mg total) by mouth daily. 12/30/21   Loel Dubonnet, NP  montelukast (SINGULAIR) 10 MG tablet Take 10 mg by mouth at bedtime.  02/24/15   [provider]  Multiple Vitamin (MULTIVITAMIN WITH MINERALS) TABS tablet Take 1 tablet by mouth daily.  [provider]  nitroGLYCERIN (NITROSTAT) 0.4 MG SL tablet Place 1 tablet (0.4 mg total) under the tongue every 5 (five) minutes as needed for chest pain. 09/21/20 12/20/20  Skeet Latch, MD  pravastatin (PRAVACHOL) 10 MG tablet Take 1 tablet (10 mg total) by mouth daily. 12/30/21   Loel Dubonnet, NP  traMADol (ULTRAM) 50 MG tablet Take 0.5-1 tablets (25-50 mg total) by mouth every 6 (six) hours as needed. 05/02/18   Gardenia Phlegm, NP      Allergies    Atenolol, Bisoprolol, Prednisone, Amoxicillin-pot clavulanate, Hctz [hydrochlorothiazide],  Rosuvastatin, and Latex    Review of Systems   Review of Systems  Constitutional:  Negative for chills and fever.  HENT:  Negative for ear pain and sore throat.   Eyes:  Negative for pain and visual disturbance.  Respiratory:  Negative for cough and shortness of breath.   Cardiovascular:  Negative for chest pain and palpitations.  Gastrointestinal:  Negative for abdominal pain and vomiting.  Genitourinary:  Negative for dysuria and hematuria.  Musculoskeletal:  Negative for arthralgias and back pain.  Skin:  Negative for color change and rash.  Neurological:  Positive for weakness and numbness. Negative for seizures and syncope.  All other systems reviewed and are negative.   Physical Exam Updated Vital Signs BP (!) 156/70   Pulse (!) 53   Temp 98.3 F (36.8 C)   Resp 13   Ht 1.575 m ('5\' 2"'$ )   Wt 54.8 kg   LMP  (LMP Unknown)   SpO2 98%   BMI 22.09 kg/m  Physical Exam Vitals and nursing note reviewed.  Constitutional:      General: She is not in acute distress.    Appearance: Normal appearance. She is well-developed.  HENT:     Head: Normocephalic and atraumatic.     Mouth/Throat:     Mouth: Mucous membranes are moist.  Eyes:     Conjunctiva/sclera: Conjunctivae normal.     Pupils: Pupils are equal, round, and reactive to light.  Cardiovascular:     Rate and Rhythm: Normal rate and regular rhythm.     Heart sounds: No murmur heard. Pulmonary:     Effort: Pulmonary effort is normal. No respiratory distress.     Breath sounds: Normal breath sounds.  Abdominal:     Palpations: Abdomen is soft.     Tenderness: There is no abdominal tenderness.  Musculoskeletal:        General: No swelling.     Cervical back: Normal range of motion and neck supple.  Skin:    General: Skin is warm and dry.     Capillary Refill: Capillary refill takes less than 2 seconds.  Neurological:     Mental Status: She is alert.     Cranial Nerves: No cranial nerve deficit.     Sensory:  Sensory deficit present.     Motor: Weakness present.     Comments: Patient with right upper extremity right lower extremity weakness  Psychiatric:        Mood and Affect: Mood normal.     ED Results / Procedures / Treatments   Labs (all labs ordered are listed, but only abnormal results are displayed) Labs Reviewed  CBC - Abnormal; Notable for the following components:      Result Value   MCH 34.4 (*)    All other components within normal limits  RESP PANEL BY RT-PCR (FLU A&B, COVID) ARPGX2  ETHANOL  PROTIME-INR  APTT  DIFFERENTIAL  COMPREHENSIVE METABOLIC PANEL  RAPID URINE DRUG SCREEN, HOSP PERFORMED  URINALYSIS, ROUTINE W REFLEX MICROSCOPIC  CBG MONITORING, ED    EKG EKG Interpretation  Date/Time:  Friday May 12 2022 16:02:37 EDT Ventricular Rate:  56 PR Interval:  152 QRS Duration: 75 QT Interval:  401 QTC Calculation: 387 R Axis:   58 Text Interpretation: Sinus rhythm Low voltage, precordial leads Confirmed by Fredia Sorrow [phone]) on 05/12/2022 4:06:49 PM  Radiology CT HEAD CODE STROKE WO CONTRAST  Result Date: 05/12/2022 CLINICAL DATA:  Code stroke. Neuro deficit, acute, stroke suspected. Right arm numbness and leg weakness with speech disturbance. EXAM: CT HEAD WITHOUT CONTRAST TECHNIQUE: Contiguous axial images were obtained from the base of the skull through the vertex without intravenous contrast. RADIATION DOSE REDUCTION: This exam was performed according to the departmental dose-optimization program which includes automated exposure control, adjustment of the mA and/or kV according to patient size and/or use of iterative reconstruction technique. COMPARISON:  None Available. FINDINGS: Brain: There is no evidence of an acute infarct, intracranial hemorrhage, mass, midline shift, or extra-axial fluid collection. Mild prominence of the lateral ventricles is favored to reflect central predominant cerebral atrophy. Patchy hypodensities in the cerebral white  matter bilaterally are nonspecific but compatible with moderate chronic small vessel ischemic disease. Vascular: Calcified atherosclerosis at the skull base. No hyperdense vessel. Skull: No fracture or suspicious osseous lesion. Sinuses/Orbits: Visualized paranasal sinuses and mastoid air cells are clear. Visualized orbits are unremarkable. Other: None. ASPECTS [facility] Stroke Program Early CT Score) - Ganglionic level infarction (caudate, lentiform nuclei, internal capsule, insula, M1-M3 cortex): 7 - Supraganglionic infarction (M4-M6 cortex): 3 Total score (0-10 with 10 being normal): 10 IMPRESSION: 1. No evidence of acute intracranial abnormality. ASPECTS of 10. 2. Moderate chronic small vessel ischemic disease. These results were called by telephone at the time of interpretation on 05/12/2022 at 4:01 pm to Dr. Fredia Sorrow, who verbally acknowledged these results. Electronically Signed   By: Logan Bores M.D.   On: 05/12/2022 16:03    Procedures Procedures    Medications Ordered in ED Medications  clevidipine (CLEVIPREX) infusion 0.5 mg/mL (has no administration in time range)  tenecteplase (TNKASE) injection for Stroke 14 mg (14 mg Intravenous Given 05/12/22 1611)  iohexol (OMNIPAQUE) 350 MG/ML injection 100 mL (100 mLs Intravenous Contrast Given 05/12/22 1620)    ED Course/ Medical Decision Making/ A&P                           Medical Decision Making Amount and/or Complexity of Data Reviewed Labs: ordered. Radiology: ordered.  Risk Prescription drug management. Decision regarding hospitalization.   CRITICAL CARE Performed by: Fredia Sorrow Total critical care time: 60 minutes Critical care time was exclusive of separately billable procedures and treating other patients. Critical care was necessary to treat or prevent imminent or life-threatening deterioration. Critical care was time spent personally by me on the following activities: development of treatment plan with patient  and/or surrogate as well as nursing, discussions with consultants, evaluation of patient's response to treatment, examination of patient, obtaining history from patient or surrogate, ordering and performing treatments and interventions, ordering and review of laboratory studies, ordering and review of radiographic studies, pulse oximetry and re-evaluation of patient's condition.  Patient last known normal at 2 PM.  Patient with right-sided upper extremity lower extremity weakness and some numbness.  It was more severe at first now still has some mild weakness and decreased sensation to the right  side.  Code stroke activated.  Patient's initial blood pressure was 177/79.  Patient seen by the code stroke teleneurology team.  They have ordered TNK.  They did or so order Clevapine because blood pressure were high at 1 point they seem to be coming down now.  CT without evidence of a bleed.  Patient did later develop a little bit of a mild headache this was prior to the TNK being given.  They were ordering CT angio head and neck here.  Patient will be admitted to the ICU at [facility].  Temporary admit orders placed for admission under Dr. Cheral Marker.  Final Clinical Impression(s) / ED Diagnoses Final diagnoses:  Cerebrovascular accident (CVA), unspecified mechanism [facility])    Rx / DC Orders ED Discharge Orders     None         Fredia Sorrow, MD 05/12/22 1628    Fredia Sorrow, MD 05/12/22 1651

## 2022-05-13 ENCOUNTER — Other Ambulatory Visit: Payer: Self-pay

## 2022-05-13 ENCOUNTER — Inpatient Hospital Stay (HOSPITAL_COMMUNITY): Payer: Medicare PPO

## 2022-05-13 DIAGNOSIS — I6389 Other cerebral infarction: Secondary | ICD-10-CM | POA: Diagnosis not present

## 2022-05-13 DIAGNOSIS — I639 Cerebral infarction, unspecified: Secondary | ICD-10-CM | POA: Diagnosis present

## 2022-05-13 LAB — ECHOCARDIOGRAM COMPLETE
Area-P 1/2: 3.91 cm2
Height: 62 in
S' Lateral: 2.6 cm
Weight: 1932.8 oz

## 2022-05-13 LAB — MRSA NEXT GEN BY PCR, NASAL: MRSA by PCR Next Gen: NOT DETECTED

## 2022-05-13 LAB — HEMOGLOBIN A1C
Hgb A1c MFr Bld: 5.7 % — ABNORMAL HIGH (ref 4.8–5.6)
Mean Plasma Glucose: 116.89 mg/dL

## 2022-05-13 LAB — GLUCOSE, CAPILLARY: Glucose-Capillary: 130 mg/dL — ABNORMAL HIGH (ref 70–99)

## 2022-05-13 LAB — LIPID PANEL
Cholesterol: 264 mg/dL — ABNORMAL HIGH (ref 0–200)
HDL: 86 mg/dL (ref >40)
LDL Cholesterol: 167 mg/dL — ABNORMAL HIGH (ref 0–99)
Total CHOL/HDL Ratio: 3.1 RATIO
Triglycerides: 56 mg/dL (ref <150)
VLDL: 11 mg/dL (ref 0–40)

## 2022-05-13 MED ORDER — ASPIRIN 81 MG PO TBEC: 81.0000 mg | DELAYED_RELEASE_TABLET | Freq: Every day | ORAL | Status: DC

## 2022-05-13 MED ORDER — PRAVASTATIN SODIUM 10 MG PO TABS
10.0000 mg | ORAL_TABLET | Freq: Every day | ORAL | Status: DC
  Administered 2022-05-13: 10 mg via ORAL
  Filled 2022-05-13 (×2): qty 1

## 2022-05-13 MED ORDER — LEVOTHYROXINE SODIUM 25 MCG PO TABS
25.0000 ug | ORAL_TABLET | Freq: Every day | ORAL | Status: DC
  Administered 2022-05-14 – 2022-05-17 (×4): 25 ug via ORAL
  Filled 2022-05-13 (×4): qty 1

## 2022-05-13 MED ORDER — VITAMIN B-12 1000 MCG PO TABS
1000.0000 ug | ORAL_TABLET | Freq: Every day | ORAL | Status: DC
  Administered 2022-05-13 – 2022-05-17 (×5): 1000 ug via ORAL
  Filled 2022-05-13 (×5): qty 1

## 2022-05-13 MED ORDER — ALPRAZOLAM 0.25 MG PO TABS: 0.2500 mg | ORAL_TABLET | Freq: Every evening | ORAL | Status: DC | PRN

## 2022-05-13 MED ORDER — OYSTER SHELL CALCIUM/D3 500-5 MG-MCG PO TABS
1.0000 | ORAL_TABLET | Freq: Every day | ORAL | Status: DC
  Administered 2022-05-14 – 2022-05-17 (×4): 1 via ORAL
  Filled 2022-05-13 (×5): qty 1

## 2022-05-13 MED ORDER — VITAMIN D 25 MCG (1000 UNIT) PO TABS
2000.0000 [IU] | ORAL_TABLET | Freq: Every day | ORAL | Status: DC
  Administered 2022-05-13 – 2022-05-17 (×5): 2000 [IU] via ORAL
  Filled 2022-05-13 (×5): qty 2

## 2022-05-13 MED ORDER — ONDANSETRON HCL 4 MG/2ML IJ SOLN: 4.0000 mg | Freq: Four times a day (QID) | INTRAMUSCULAR | Status: DC

## 2022-05-13 MED ORDER — CHLORHEXIDINE GLUCONATE CLOTH 2 % EX PADS
6.0000 | MEDICATED_PAD | Freq: Every day | CUTANEOUS | Status: DC
Start: 2022-05-13 — End: 2022-05-17
  Administered 2022-05-13 – 2022-05-17 (×3): 6 via TOPICAL

## 2022-05-13 MED ORDER — ACETAMINOPHEN 325 MG PO TABS
650.0000 mg | ORAL_TABLET | Freq: Four times a day (QID) | ORAL | Status: DC | PRN
  Administered 2022-05-13 – 2022-05-17 (×3): 650 mg via ORAL
  Filled 2022-05-13 (×3): qty 2

## 2022-05-13 MED ORDER — CLOBETASOL PROPIONATE 0.05 % EX OINT
TOPICAL_OINTMENT | Freq: Two times a day (BID) | CUTANEOUS | Status: DC | PRN
Start: 2022-05-13 — End: 2022-05-17

## 2022-05-13 MED ORDER — PANTOPRAZOLE SODIUM 40 MG IV SOLR
40.0000 mg | Freq: Every day | INTRAVENOUS | Status: DC
  Administered 2022-05-13 – 2022-05-14 (×2): 40 mg via INTRAVENOUS
  Filled 2022-05-13 (×2): qty 10

## 2022-05-13 MED ORDER — BIOTIN 1000 MCG PO TABS: 1000.0000 ug | ORAL_TABLET | Freq: Every day | ORAL | Status: DC

## 2022-05-13 MED ORDER — B COMPLEX-C PO TABS
1.0000 | ORAL_TABLET | Freq: Every day | ORAL | Status: DC
  Administered 2022-05-13 – 2022-05-17 (×5): 1 via ORAL
  Filled 2022-05-13 (×5): qty 1

## 2022-05-13 MED ORDER — ONDANSETRON HCL 4 MG/2ML IJ SOLN
4.0000 mg | Freq: Four times a day (QID) | INTRAMUSCULAR | Status: DC | PRN
  Administered 2022-05-13: 4 mg via INTRAVENOUS
  Filled 2022-05-13: qty 2

## 2022-05-13 MED ORDER — AMLODIPINE BESYLATE 5 MG PO TABS
5.0000 mg | ORAL_TABLET | Freq: Every day | ORAL | Status: DC
  Administered 2022-05-13 – 2022-05-17 (×5): 5 mg via ORAL
  Filled 2022-05-13 (×5): qty 1

## 2022-05-13 MED ORDER — SENNOSIDES-DOCUSATE SODIUM 8.6-50 MG PO TABS
1.0000 | ORAL_TABLET | Freq: Every evening | ORAL | Status: DC | PRN
  Administered 2022-05-15: 1 via ORAL
  Filled 2022-05-13: qty 1

## 2022-05-13 MED ORDER — ASPIRIN 81 MG PO TBEC
81.0000 mg | DELAYED_RELEASE_TABLET | Freq: Every day | ORAL | Status: DC
  Administered 2022-05-13 – 2022-05-17 (×5): 81 mg via ORAL
  Filled 2022-05-13 (×5): qty 1

## 2022-05-13 MED ORDER — ASPIRIN 300 MG RE SUPP: 300.0000 mg | Freq: Every day | RECTAL | Status: DC

## 2022-05-13 MED ORDER — CITALOPRAM HYDROBROMIDE 10 MG PO TABS
20.0000 mg | ORAL_TABLET | Freq: Every day | ORAL | Status: DC
  Administered 2022-05-13 – 2022-05-17 (×5): 20 mg via ORAL
  Filled 2022-05-13 (×5): qty 2

## 2022-05-13 MED ORDER — LISINOPRIL 20 MG PO TABS
40.0000 mg | ORAL_TABLET | Freq: Every day | ORAL | Status: DC
  Administered 2022-05-13 – 2022-05-17 (×5): 40 mg via ORAL
  Filled 2022-05-13 (×6): qty 2

## 2022-05-13 MED ORDER — SODIUM CHLORIDE 0.9 % IV SOLN: INTRAVENOUS | Status: DC

## 2022-05-13 MED ORDER — ANASTROZOLE 1 MG PO TABS
1.0000 mg | ORAL_TABLET | Freq: Every day | ORAL | Status: DC
  Administered 2022-05-13 – 2022-05-17 (×5): 1 mg via ORAL
  Filled 2022-05-13 (×5): qty 1

## 2022-05-13 MED ORDER — STROKE: EARLY STAGES OF RECOVERY BOOK
Freq: Once | Status: AC
Start: 2022-05-14 — End: 2022-05-14
  Administered 2022-05-14: 1
  Filled 2022-05-13: qty 1

## 2022-05-13 NOTE — Progress Notes (Signed)

## 2022-05-13 NOTE — Progress Notes (Signed)
  Echocardiogram 2D Echocardiogram has been performed.  Angela Buchanan F 05/13/2022, 5:46 PM

## 2022-05-13 NOTE — Evaluation (Signed)
Physical Therapy Evaluation Patient Details Name: Angela Buchanan MRN: [id number] DOB: Oct 11, 1950 Today's Date: 05/13/2022  History of Present Illness  Pt is a 71 y.o. female who presented 05/12/22 with acute R-sided weakness and difficulty speaking. Pt administered TNK and transferred to [facility]. CT head negative for acute intracranial abnormality but MRI brain has not yet been completed. PMH: bigeminy, breast cancer, carotid bruit, HTN, PAC, PVC, HLD   Clinical Impression  Pt presents with condition above and deficits mentioned below, see PT Problem List. 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 They live in a 1-level house with 3 STE. Pt is currently demonstrating deficits in cognition, communication, balance, R-sided strength, R-sided sensation, and R-sided coordination. She is very motivated to participate and improve, and finds ways to compensate for her R-sided deficits, but needs cues to slow down to try to attend to her R side with mobility. Pt required minA for bed mobility, minAx2 for transfers to stand, and modAx2 to take a few side steps with UE support today. As pt has had a drastic functional decline and is very motivated to improve, she would be a great candidate for AIR. Will continue to follow acutely.        Recommendations for follow up therapy are one component of a multi-disciplinary discharge planning process, led by the attending physician.  Recommendations may be updated based on patient status, additional functional criteria and insurance authorization.  Follow Up Recommendations Acute inpatient rehab (3hours/day)      Assistance Recommended at Discharge Frequent or constant Supervision/Assistance  Patient can return home with the following  A lot of help with walking and/or transfers;A lot of help with bathing/dressing/bathroom;Assistance with cooking/housework;Direct supervision/assist for medications management;Direct  supervision/assist for financial management;Assist for transportation;Help with stairs or ramp for entrance    Equipment Recommendations Rolling walker (2 wheels);BSC/3in1;Wheelchair (measurements PT);Wheelchair cushion (measurements PT) (pending progress)  Recommendations for Other Services  Rehab consult    Functional Status Assessment Patient has had a recent decline in their functional status and demonstrates the ability to make significant improvements in function in a reasonable and predictable amount of time.     Precautions / Restrictions Precautions Precautions: Fall Restrictions Weight Bearing Restrictions: No      Mobility  Bed Mobility Overal bed mobility: Needs Assistance Bed Mobility: Supine to Sit     Supine to sit: Min assist, HOB elevated, +2 for safety/equipment     General bed mobility comments: Pt needing increased time to manage R leg to L EOB, compensating for lack of R leg strength by briding up with her L leg and using her core to slide her R leg to the edge. MinA to finish bringing R leg off bed and ascend trunk, HOB elevated.    Transfers Overall transfer level: Needs assistance Equipment used: 2 person hand held assist Transfers: Sit to/from Stand, Bed to chair/wheelchair/BSC Sit to Stand: +2 safety/equipment, Min assist, +2 physical assistance   Step pivot transfers: Mod assist, +2 physical assistance, +2 safety/equipment       General transfer comment: Pt with posterior bias, relying on posterior aspect of legs to rest against EOB for support when standing, R knee block provided, minAx2. ModAx2 for weight shifting, advancing R leg, and blocking R knee during stance while providing pt with HHA support to step to L bed > recliner.    Ambulation/Gait Ambulation/Gait assistance: Mod assist, +2 physical assistance, +2 safety/equipment Gait Distance (Feet): 2  Feet Assistive device: 2 person hand held assist Gait Pattern/deviations: Step-to pattern,  Decreased step length - right, Decreased dorsiflexion - right, Decreased stride length, Decreased weight shift to left, Decreased weight shift to right, Knees buckling Gait velocity: reduced Gait velocity interpretation: <1.31 ft/sec, indicative of household ambulator   General Gait Details: ModAx2 for weight shifting, advancing R leg, and blocking R knee during stance while providing pt with HHA support to step to L bed > recliner.  Stairs            Wheelchair Mobility    Modified Rankin (Stroke Patients Only) Modified Rankin (Stroke Patients Only) Pre-Morbid Rankin Score: No symptoms Modified Rankin: Moderately severe disability     Balance Overall balance assessment: Needs assistance Sitting-balance support: Single extremity supported, Feet supported, Bilateral upper extremity supported Sitting balance-Leahy Scale: Poor Sitting balance - Comments: Pt needing min guard-minA for static sitting balance EOB   Standing balance support: Bilateral upper extremity supported, During functional activity Standing balance-Leahy Scale: Poor Standing balance comment: Reliant on UE support and up to modAx2                             Pertinent Vitals/Pain Pain Assessment Pain Assessment: Faces Faces Pain Scale: No hurt Pain Intervention(s): Monitored during session    Home Living Family/patient expects to be discharged to:: Private residence Living Arrangements: Spouse/significant other Available Help at Discharge: Family (husband can assist minimally) Type of Home: House Home Access: Stairs to enter Entrance Stairs-Rails: None Entrance Stairs-Number of Steps: 3   Home Layout: One level Home Equipment: None Additional Comments: pt assists her husband with ADLs as pt has stage IV throat cancer    Prior Function Prior Level of Function : Independent/Modified Independent;Driving               ADLs Comments: pt managing meds and finances and caregiver for her  husband     Hand Dominance        Extremity/Trunk Assessment   Upper Extremity Assessment Upper Extremity Assessment: Defer to OT evaluation    Lower Extremity Assessment Lower Extremity Assessment: RLE deficits/detail RLE Deficits / Details: Pt able to perform AROM ankle dorsiflexion and plantarlfexion and quad set, but with noted weakness and difficulty; trace if any hip activation noted, compensates by using her core; able to detect touch but slightly decreased RLE Sensation: decreased light touch RLE Coordination: decreased fine motor;decreased gross motor    Cervical / Trunk Assessment Cervical / Trunk Assessment: Normal  Communication   Communication: Expressive difficulties  Cognition Arousal/Alertness: Awake/alert Behavior During Therapy: WFL for tasks assessed/performed Overall Cognitive Status: Impaired/Different from baseline Area of Impairment: Problem solving, Attention                   Current Attention Level: Selective         Problem Solving: Difficulty sequencing, Requires verbal cues, Requires tactile cues General Comments: Pt follows commands appropriately, but needs cues to sequence weight shifting and advancing legs when stepping. Pt often trying to step quickly to the L before adequately weight shifting or lacking to try to bring R leg along with her. Tangential at times.        General Comments General comments (skin integrity, edema, etc.): VSS on RA    Exercises     Assessment/Plan    PT Assessment Patient needs continued PT services  PT Problem List Decreased strength;Decreased activity tolerance;Decreased balance;Decreased mobility;Decreased coordination;Decreased cognition;Decreased  knowledge of use of DME;Impaired sensation       PT Treatment Interventions DME instruction;Gait training;Stair training;Functional mobility training;Therapeutic activities;Therapeutic exercise;Balance training;Neuromuscular re-education;Cognitive  remediation;Patient/family education    PT Goals (Current goals can be found in the Care Plan section)  Acute Rehab PT Goals Patient Stated Goal: to get better PT Goal Formulation: With patient Time For Goal Achievement: 05/27/22 Potential to Achieve Goals: Good    Frequency Min 4X/week     Co-evaluation PT/OT/SLP Co-Evaluation/Treatment: Yes Reason for Co-Treatment: For patient/therapist safety;To address functional/ADL transfers PT goals addressed during session: Mobility/safety with mobility;Balance         AM-PAC PT "6 Clicks" Mobility  Outcome Measure Help needed turning from your back to your side while in a flat bed without using bedrails?: A Little Help needed moving from lying on your back to sitting on the side of a flat bed without using bedrails?: A Little Help needed moving to and from a bed to a chair (including a wheelchair)?: Total Help needed standing up from a chair using your arms (e.g., wheelchair or bedside chair)?: A Lot Help needed to walk in hospital room?: Total Help needed climbing 3-5 steps with a railing? : Total 6 Click Score: 11    End of Session Equipment Utilized During Treatment: Gait belt Activity Tolerance: Patient tolerated treatment well Patient left: in chair;with call bell/phone within reach;with nursing/sitter in room Nurse Communication: Mobility status PT Visit Diagnosis: Unsteadiness on feet (R26.81);Other abnormalities of gait and mobility (R26.89);Muscle weakness (generalized) (M62.81);Difficulty in walking, not elsewhere classified (R26.2);Other symptoms and signs involving the nervous system (R29.898);Hemiplegia and hemiparesis Hemiplegia - Right/Left: Right    Time: 5035-4656 PT Time Calculation (min) (ACUTE ONLY): 19 min   Charges:   PT Evaluation $PT Eval Moderate Complexity: 1 Mod          Moishe Spice, PT, DPT Acute Rehabilitation Services  Office: [phone]   Orvan Falconer 05/13/2022, 12:10 PM

## 2022-05-13 NOTE — Evaluation (Signed)
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   Breast cancer ([place]) 06/2017   right   Bruises easily    Carotid bruit 11/13/2019   Dental crowns present    History of diverticulitis    Hypertension    states under control with med., has been on med. x 5 yr.   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   Past Surgical History:  Past Surgical History:  Procedure Laterality Date   ABDOMINAL HYSTERECTOMY     partial   BREAST LUMPECTOMY Right 06/22/2017   Malignant   BREAST LUMPECTOMY WITH RADIOACTIVE SEED AND SENTINEL LYMPH NODE BIOPSY Right 06/22/2017   Procedure: RIGHT BREAST LUMPECTOMY WITH RADIOACTIVE SEED AND RIGHT AXILLARY DEEP SENTINEL LYMPH NODE BIOPSY WITH BLUE DYE  INJECTION;  Surgeon: Fanny Skates, MD;  Location: [facility];  Service: General;  Laterality: Right;   BREAST LUMPECTOMY WITH RADIOACTIVE SEED LOCALIZATION Left 06/22/2017   Procedure: LEFT BREAST LUMPECTOMY WITH RADIOACTIVE SEED LOCALIZATION;  Surgeon: Fanny Skates, MD;  Location: [place];  Service: General;  Laterality: Left;   CATARACT EXTRACTION W/ INTRAOCULAR LENS  IMPLANT, BILATERAL Bilateral    EXCISION OF BREAST BIOPSY Left 06/22/2017   benign   TONSILLECTOMY     age 61   HPI:  Patient is a 71 y.o. female with PMH: HLD, atipal CP, right breast cancer and carotid bruit who presented to [facility] [place] on 05/12/22 with acute right sided weakness and difficulty speaking. In ED patient's NIHSS was 4; she received TNK and sent to [facility] for post-TNK management and stroke work-up. CT head negative for acute intracranial abnormality but MRI brain has not yet been completed. Patient was made NPO awaiting SLP swallow evaluation.   Assessment / Plan / Recommendation Clinical Impression  Patient is presenting with a mild-moderate dysarthria caused in part by flaccidity on right side and slow motoric movements of articulators. This also results in a decreased vocal intensity. Patient's speech production is approximately 75% at word level and less than 75% at phrase and sentence level. Patient's cognitive-linguistic functioning both appear to be WFL-WNL as per this assessment. She would benefit from continued SLP intervention while in acute care and recommendation is for AIR when stable for discharge.    SLP Assessment  SLP Recommendation/Assessment: Patient needs continued Speech Lanaguage Pathology Services SLP Visit Diagnosis:  Dysarthria and anarthria (R47.1)    Recommendations for follow up therapy are one component of a multi-disciplinary discharge planning process, led by the attending physician.  Recommendations may be updated based on patient status, additional  functional criteria and insurance authorization.    Follow Up Recommendations  Acute inpatient rehab (3hours/day)    Assistance Recommended at Discharge  Frequent or constant Supervision/Assistance  Functional Status Assessment Patient has had a recent decline in their functional status and demonstrates the ability to make significant improvements in function in a reasonable and predictable amount of time.  Frequency and Duration min 2x/week  1 week      SLP Evaluation Cognition  Overall Cognitive Status: Within Functional Limits for tasks assessed Arousal/Alertness: Awake/alert Orientation Level: Oriented X4 Year: 2023 Month: August Day of Week: Correct Attention: Sustained Sustained Attention: Appears intact Memory: Appears intact Awareness: Appears intact Problem Solving: Appears intact Safety/Judgment: Appears intact       Comprehension  Auditory Comprehension Overall Auditory Comprehension: Appears within functional limits for tasks assessed    Expression Expression Primary Mode of Expression: Verbal Verbal Expression Overall Verbal Expression: Appears within functional limits for tasks assessed   Oral / Motor  Oral Motor/Sensory Function Overall Oral Motor/Sensory Function: Moderate impairment Facial ROM: Reduced right Facial Symmetry: Abnormal symmetry right Facial Strength: Reduced right Lingual ROM: Reduced right Lingual Symmetry: Within Functional Limits Lingual Strength: Reduced Motor Speech Overall Motor Speech: Impaired Respiration: Within functional limits Phonation: Low vocal intensity Resonance: Within functional limits Articulation: Impaired Level of Impairment: Sentence Intelligibility: Intelligibility reduced Word: 75-100% accurate Phrase: 50-74% accurate Sentence: 50-74% accurate Conversation: Not tested Motor Planning: Witnin functional limits Effective Techniques: Slow rate            Sonia Baller, MA, CCC-SLP Speech Therapy

## 2022-05-13 NOTE — Progress Notes (Addendum)
STROKE TEAM PROGRESS NOTE   INTERVAL HISTORY No visitors at bedside.  Bedside RN reports that patient has had fluctuating R sided weakness and repeat CT scan of the head showed no acute abnormality or hemorrhage C/o right sided weakness and headache this morning.  Blood pressure adequately controlled.  MRI scan of the brain is pending  Vitals:   05/13/22 0500 05/13/22 0600 05/13/22 0700 05/13/22 0800  BP: (!) 154/77 (!) 163/89 125/77 (!) 158/71  Pulse: 61 64 (!) 56 61  Resp: '18 18 17 17  '$ Temp:    98.9 F (37.2 C)  TempSrc:    Axillary  SpO2: 93% 96% 94% 95%  Weight:      Height:       CBC:  Recent Labs  Lab 05/12/22 1545  WBC 4.2  NEUTROABS 2.5  HGB 13.7  HCT 39.8  MCV 100.0  PLT 093   Basic Metabolic Panel:  Recent Labs  Lab 05/12/22 1545  NA 135  K 3.7  CL 102  CO2 23  GLUCOSE 94  BUN 10  CREATININE 0.77  CALCIUM 9.6   Lipid Panel:  Recent Labs  Lab 05/13/22 0137  CHOL 264*  TRIG 56  HDL 86  CHOLHDL 3.1  VLDL 11  LDLCALC 167*   HgbA1c:  Recent Labs  Lab 05/13/22 0137  HGBA1C 5.7*   Urine Drug Screen:  Recent Labs  Lab 05/12/22 2236  LABOPIA NONE DETECTED  COCAINSCRNUR NONE DETECTED  LABBENZ NONE DETECTED  AMPHETMU NONE DETECTED  THCU NONE DETECTED  LABBARB NONE DETECTED    Alcohol Level  Recent Labs  Lab 05/12/22 1545  ETH <10    IMAGING past 24 hours CT HEAD WO CONTRAST (5MM)  Result Date: 05/12/2022 CLINICAL DATA:  Follow-up examination for neuro deficit, stroke suspected. EXAM: CT HEAD WITHOUT CONTRAST TECHNIQUE: Contiguous axial images were obtained from the base of the skull through the vertex without intravenous contrast. RADIATION DOSE REDUCTION: This exam was performed according to the departmental dose-optimization program which includes automated exposure control, adjustment of the mA and/or kV according to patient size and/or use of iterative reconstruction technique. COMPARISON:  Multiple previous CTs from earlier the same  day. FINDINGS: Brain: Stable atrophy with chronic small vessel ischemic disease. No acute intracranial hemorrhage. No visible acute or large vessel territory infarct. No mass lesion, mass effect or midline shift. No hydrocephalus or extra-axial fluid collection. Vascular: Residual contrast material seen within the intracranial circulation. Scattered vascular calcifications noted within the carotid siphons. Skull: Scalp soft tissues and calvarium demonstrate no new finding. Sinuses/Orbits: Globes orbital soft tissues within normal limits. Paranasal sinuses and mastoid air cells are clear. Other: None. IMPRESSION: 1. Stable head CT. No visible acute or evolving ischemia or other acute intracranial abnormality. 2. Stable atrophy with chronic small vessel ischemic disease. Electronically Signed   By: Jeannine Boga M.D.   On: 05/12/2022 19:35   CT Head Wo Contrast  Result Date: 05/12/2022 CLINICAL DATA:  Worsening weakness on the left. Slurred speech. Neuro deficit. EXAM: CT HEAD WITHOUT CONTRAST TECHNIQUE: Contiguous axial images were obtained from the base of the skull through the vertex without intravenous contrast. RADIATION DOSE REDUCTION: This exam was performed according to the departmental dose-optimization program which includes automated exposure control, adjustment of the mA and/or kV according to patient size and/or use of iterative reconstruction technique. COMPARISON:  Noncontrast CT of the brain May 12, 2022. CT angiogram of the brain May 12, 2022. FINDINGS: Brain: Significant contrast remains in the  vessels and dural sinuses. White matter changes are stable and moderate. No acute cortical ischemia or infarct. Ventricles and sulci are stable. Cerebellum, brainstem, and basal cisterns are stable. No evidence of subdural, epidural, or subarachnoid hemorrhage. Vascular: Calcified atherosclerotic changes are identified in the intracranial carotids. Skull: Normal. Negative for fracture or focal  lesion. Sinuses/Orbits: No acute finding. Other: None. IMPRESSION: No acute intracranial abnormality identified. No significant change since the noncontrast CT scan earlier today. Electronically Signed   By: Dorise Bullion III M.D.   On: 05/12/2022 17:44   CT ANGIO HEAD NECK W WO CM (CODE STROKE)  Result Date: 05/12/2022 CLINICAL DATA:  Neuro deficit, acute, stroke suspected EXAM: CT ANGIOGRAPHY HEAD AND NECK TECHNIQUE: Multidetector CT imaging of the head and neck was performed using the standard protocol during bolus administration of intravenous contrast. Multiplanar CT image reconstructions and MIPs were obtained to evaluate the vascular anatomy. Carotid stenosis measurements (when applicable) are obtained utilizing NASCET criteria, using the distal internal carotid diameter as the denominator. RADIATION DOSE REDUCTION: This exam was performed according to the departmental dose-optimization program which includes automated exposure control, adjustment of the mA and/or kV according to patient size and/or use of iterative reconstruction technique. CONTRAST:  56m OMNIPAQUE IOHEXOL 350 MG/ML SOLN COMPARISON:  None Available. FINDINGS: CTA NECK Aortic arch: Unremarkable.  Great vessel origins are patent. Right carotid system: Patent.  No stenosis. Left carotid system: Patent.  No stenosis. Vertebral arteries: Patent and codominant. No stenosis or evidence of dissection. Skeleton: Mild cervical spine degenerative changes. Other neck: Right thyroid nodule previously evaluated by ultrasound. Upper chest: No apical lung mass. Review of the MIP images confirms the above findings CTA HEAD Anterior circulation: Intracranial internal carotid arteries are patent with minimal calcified plaque. Anterior cerebral arteries are patent. Middle cerebral arteries are patent. Posterior circulation: Intracranial vertebral arteries patent. Basilar artery is patent. Major cerebellar artery origins are patent. Left posterior  communicating artery is present. Posterior cerebral arteries are patent with fetal origin on. Moderate stenosis at the left P1-P2 junction. Venous sinuses: Patent as allowed by contrast bolus timing. Review of the MIP images confirms the above findings IMPRESSION: No large vessel occlusion, hemodynamically significant stenosis, or evidence of dissection. Moderate stenosis left P1-P2 PCA junction. Electronically Signed   By: PMacy MisM.D.   On: 05/12/2022 16:39   CT HEAD CODE STROKE WO CONTRAST  Result Date: 05/12/2022 CLINICAL DATA:  Code stroke. Neuro deficit, acute, stroke suspected. Right arm numbness and leg weakness with speech disturbance. EXAM: CT HEAD WITHOUT CONTRAST TECHNIQUE: Contiguous axial images were obtained from the base of the skull through the vertex without intravenous contrast. RADIATION DOSE REDUCTION: This exam was performed according to the departmental dose-optimization program which includes automated exposure control, adjustment of the mA and/or kV according to patient size and/or use of iterative reconstruction technique. COMPARISON:  None Available. FINDINGS: Brain: There is no evidence of an acute infarct, intracranial hemorrhage, mass, midline shift, or extra-axial fluid collection. Mild prominence of the lateral ventricles is favored to reflect central predominant cerebral atrophy. Patchy hypodensities in the cerebral white matter bilaterally are nonspecific but compatible with moderate chronic small vessel ischemic disease. Vascular: Calcified atherosclerosis at the skull base. No hyperdense vessel. Skull: No fracture or suspicious osseous lesion. Sinuses/Orbits: Visualized paranasal sinuses and mastoid air cells are clear. Visualized orbits are unremarkable. Other: None. ASPECTS ([facility] - KmiStroke Program Early CT Score) - Ganglionic level infarction (caudate, lentiform nuclei, internal capsule, insula, M1-M3 cortex): 7 - Supraganglionic  infarction (M4-M6 cortex): 3 Total  score (0-10 with 10 being normal): 10 IMPRESSION: 1. No evidence of acute intracranial abnormality. ASPECTS of 10. 2. Moderate chronic small vessel ischemic disease. These results were called by telephone at the time of interpretation on 05/12/2022 at 4:01 pm to Dr. Fredia Sorrow, who verbally acknowledged these results. Electronically Signed   By: Logan Bores M.D.   On: 05/12/2022 16:03    PHYSICAL EXAM  Temp:  [98.1 F (36.7 C)-98.9 F (37.2 C)] 98.9 F (37.2 C) (08/12 0800) Pulse Rate:  [52-88] 61 (08/12 0800) Resp:  [11-29] 17 (08/12 0800) BP: (122-180)/(64-142) 158/71 (08/12 0800) SpO2:  [93 %-100 %] 95 % (08/12 0800) Weight:  [54.4 kg-54.8 kg] 54.8 kg (08/11 1602)  General -frail elderly lady in no apparent distress.  Cardiovascular - Regular rhythm and rate.  Mental Status -  Level of arousal and orientation to time, place, and person were intact Speech is dysarthric   Cranial Nerves II - XII - II - Visual field intact OU. III, IV, VI - Extraocular movements intact. V - Facial sensation intact bilaterally. VII - + facial droop on the right VIII - Hearing & vestibular intact bilaterally. X - Palate elevates symmetrically. XI - Chin turning & shoulder shrug intact bilaterally. XII - Tongue protrusion intact.  Motor Strength - R LUE and LLE 5/5 RUE 2-3/5 proximally and distally.  RLE 2/5 proximally, 3/5 distally Sensory: Decreased temp sensation to RUE and RLE. Normal Cerebellar: No ataxia on the left. Unable to perform on the right  ASSESSMENT/PLAN ODALIZ MCQUEARY is an 71 y.o. female with a history of hypertension, HLD, atypical CP, ventricular bigeminy/PAC/PVC, right breast cancer and carotid bruit, who presented to [facility] with acute right sided weakness and difficulty speaking. She stated that she was writing and suddenly was unable to use her right hand to write and then with difficulty speaking and  right-sided weakness and numbness with difficulty walking which  resulted in the decision to be seen in the ED where a Code Stroke was called. NIHSS 4. tNK was administered. Cleviprex was initiated for BP 180/87. CTA showed  No large vessel occlusion, hemodynamically significant stenosis, or evidence of dissection. CTH negative for acute finding. Moderate stenosis left P1-P2 PCA junctio. The patient was then sent to [facility] for post-TNK management and stroke work up.   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 ASPECTS 10.    CTA head & neck  No large vessel occlusion, hemodynamically significant stenosis, or evidence of dissection Repeat HCT  No acute intracranial abnormality identified. No significant change 2D Echo PENDING LDL 167 HgbA1c 5.7 VTE prophylaxis -  Full liquid diet  On ASA '81mg'$  prior to admission Call for ASA order after imaging  Therapy recommendations:  PENDING Disposition:  TBD  Hypertension Home meds:  toprol, norvasc, lisinopril,  Unstable requiring cleviprex drip Permissive hypertension (OK if < 220/120) but gradually normalize in 5-7 days Long-term BP goal normotensive  Hyperlipidemia Home meds:  Pravastatin '10mg'$ , which was started LDL 167, not at goal < 70 High intensity statin indication is PENDING Continue statin at discharge  Dysphagia SLP advises full liquid diet for now On NS 75cc/hr, discontinue if able to tolerate diet adquately  Other Stroke Risk Factors Advanced Age >/= 86  Family hx stroke (mat grandparents and paternal  grandparents, brother, sister and mother )  Other [facility] day # 1 This patient was seen and evaluated with Dr. Leonie Man. He directed the plan of care.  Charlene Brooke, NP-C    STROKE MD NOTE :  I have personally obtained history,examined this patient, reviewed notes, independently  viewed imaging studies, participated in medical decision making and plan of care.ROS completed by me personally and pertinent positives fully documented  I have made any additions or clarifications directly to the above note. Agree with note above.  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  Recommend close neurological observation and strict blood pressure control as per postthrombolytic protocol.  Check MRI scan of the brain later today.  Physical occupational and speech therapy consults.  Long discussion with patient and family at the bedside and answered questions.  We will start dual antiplatelet therapy after MRI if no hemorrhage.This patient is critically ill and at significant risk of neurological worsening, death and care requires constant monitoring of vital signs, hemodynamics,respiratory and cardiac monitoring, extensive review of multiple databases, frequent neurological assessment, discussion with family, other specialists and medical decision making of high complexity.I have made any additions or clarifications directly to the above note.This critical care time does not reflect procedure time, or teaching time or supervisory time of PA/NP/Med Resident etc but could involve care discussion time.  I spent 30 minutes of neurocritical care time  in the care of  this patient.      Antony Contras, MD Medical Director [place] Pager: [phone] 05/13/2022 5:00 PM  To contact Stroke Continuity provider, please refer to [email or web address] After hours, contact General Neurology

## 2022-05-13 NOTE — Progress Notes (Signed)
Called in to room by patient. Patient sitting up in chair, c/o nausea and "wetness", patient noted to be slightly diaphoretic. Requesting to go back to bed. Patient's HR also noted to be in the 40's. Lowest HR noted 45 bpm. BP stable. Neuro status unchanged. Patient transferred back to bed, HR improved to 50-80's after transfer. Blood sugar taken, 130. Dr. Leonie Man made aware, no further orders, continue to monitor. Patient remained alert and responsive throughout. Resting comfortably at this time, VSS.     Vitals:   05/13/22 1200 05/13/22 1235 05/13/22 1240 05/13/22 1245  BP: (!) 154/89 (!) 145/80 139/69 (!) 166/91  Pulse: 82 66 62 66  Temp: 98.1 F (36.7 C)     Resp: (!) 23 (!) 28 17 (!) 22  Height:      Weight:      SpO2: 96% 95% 93% 95%  TempSrc: Axillary     BMI (Calculated):

## 2022-05-13 NOTE — Evaluation (Signed)
Occupational Therapy Evaluation Patient Details Name: Angela Buchanan MRN: [id number] DOB: Nov 30, 1950 Today's Date: 05/13/2022   History of Present Illness Pt is a 71 y.o. female who presented 05/12/22 with acute R-sided weakness and difficulty speaking. Pt administered TNK and transferred to [facility]. CT head negative for acute intracranial abnormality but MRI brain has not yet been completed. PMH: bigeminy, breast cancer, carotid bruit, HTN, PAC, PVC, HLD   Clinical Impression   Patient admitted for the diagnosis above.  PTA she cared for her spouse, who has stage 4 throat CA.  Patient cared for all iADL, medication for spouse, and performed all community mobility for him.  She needed no assist with her own ADL or mobility.  Deficits are listed below.  Currently needing plus two for mobility, and Max A for ADL at bedlevel.  OT will follow, but patient would benefit from AIR for aggressive post acute rehab.        Recommendations for follow up therapy are one component of a multi-disciplinary discharge planning process, led by the attending physician.  Recommendations may be updated based on patient status, additional functional criteria and insurance authorization.   Follow Up Recommendations  Acute inpatient rehab (3hours/day)    Assistance Recommended at Discharge Frequent or constant Supervision/Assistance  Patient can return home with the following A lot of help with bathing/dressing/bathroom;Two people to help with walking and/or transfers;Help with stairs or ramp for entrance;Assist for transportation;Assistance with cooking/housework    Functional Status Assessment  Patient has had a recent decline in their functional status and demonstrates the ability to make significant improvements in function in a reasonable and predictable amount of time.  Equipment Recommendations  BSC/3in1;Tub/shower seat;Wheelchair (measurements OT);Wheelchair cushion (measurements OT)    Recommendations for Other  Services Rehab consult     Precautions / Restrictions Precautions Precautions: Fall Restrictions Weight Bearing Restrictions: No      Mobility Bed Mobility Overal bed mobility: Needs Assistance Bed Mobility: Supine to Sit     Supine to sit: Min assist, HOB elevated, +2 for safety/equipment          Transfers Overall transfer level: Needs assistance Equipment used: 2 person hand held assist Transfers: Sit to/from Stand, Bed to chair/wheelchair/BSC Sit to Stand: +2 safety/equipment, Min assist, +2 physical assistance     Step pivot transfers: Mod assist, +2 physical assistance, +2 safety/equipment            Balance Overall balance assessment: Needs assistance Sitting-balance support: Single extremity supported, Feet supported, Bilateral upper extremity supported Sitting balance-Leahy Scale: Poor       Standing balance-Leahy Scale: Poor Standing balance comment: Reliant on UE support and up to modAx2                           ADL either performed or assessed with clinical judgement   ADL       Grooming: Wash/dry hands;Wash/dry face;Minimal assistance;Bed level   Upper Body Bathing: Moderate assistance;Bed level   Lower Body Bathing: Maximal assistance;Bed level   Upper Body Dressing : Moderate assistance;Bed level   Lower Body Dressing: Maximal assistance;Bed level   Toilet Transfer: Moderate assistance;Squat-pivot                   Vision Baseline Vision/History: 1 Wears glasses Patient Visual Report: No change from baseline       Perception Perception Perception: Within Functional Limits   Praxis Praxis Praxis: Intact    Pertinent Vitals/Pain  Pain Assessment Pain Assessment: No/denies pain Pain Intervention(s): Monitored during session     Hand Dominance Right   Extremity/Trunk Assessment Upper Extremity Assessment Upper Extremity Assessment: RUE deficits/detail RUE Deficits / Details: No AROM noted to elbow  distal.  Trace to shoulder flex/ext and ABd/ADd RUE Sensation: decreased light touch RUE Coordination: decreased fine motor;decreased gross motor   Lower Extremity Assessment Lower Extremity Assessment: Defer to PT evaluation RLE Deficits / Details: Pt able to perform AROM ankle dorsiflexion and plantarlfexion and quad set, but with noted weakness and difficulty; trace if any hip activation noted, compensates by using her core; able to detect touch but slightly decreased RLE Sensation: decreased light touch RLE Coordination: decreased fine motor;decreased gross motor   Cervical / Trunk Assessment Cervical / Trunk Assessment: Normal   Communication Communication Communication: Expressive difficulties   Cognition Arousal/Alertness: Awake/alert Behavior During Therapy: WFL for tasks assessed/performed Overall Cognitive Status: Impaired/Different from baseline                               Problem Solving: Difficulty sequencing, Requires verbal cues, Requires tactile cues       General Comments  VSS on RA    Exercises     Shoulder Instructions      Home Living Family/patient expects to be discharged to:: Private residence Living Arrangements: Spouse/significant other Available Help at Discharge: Family Type of Home: House Home Access: Stairs to enter Technical brewer of Steps: 3 Entrance Stairs-Rails: None Home Layout: One level     Bathroom Shower/Tub: Walk-in shower;Tub/shower unit   Armed forces training and education officer: Yes How Accessible: Accessible via walker Home Equipment: None   Additional Comments: pt assists her husband with ADLs as pt has stage IV throat cancer  Lives With: Spouse    Prior Functioning/Environment Prior Level of Function : Independent/Modified Independent;Driving               ADLs Comments: pt managing meds and finances and caregiver for her husband        OT Problem List: Decreased  strength;Decreased range of motion;Decreased activity tolerance;Impaired balance (sitting and/or standing);Decreased knowledge of precautions;Decreased knowledge of use of DME or AE;Decreased safety awareness;Impaired UE functional use      OT Treatment/Interventions: Self-care/ADL training;Therapeutic exercise;Therapeutic activities;Cognitive remediation/compensation;Patient/family education;Balance training;DME and/or AE instruction    OT Goals(Current goals can be found in the care plan section) Acute Rehab OT Goals Patient Stated Goal: Get better OT Goal Formulation: With patient Time For Goal Achievement: 05/26/22 Potential to Achieve Goals: Good ADL Goals Pt Will Perform Grooming: with supervision;sitting Pt Will Perform Upper Body Dressing: with supervision;sitting Pt Will Transfer to Toilet: with min assist;squat pivot transfer;bedside commode;regular height toilet Pt/caregiver will Perform Home Exercise Program: Increased strength;Right Upper extremity;With minimal assist  OT Frequency: Min 2X/week    Co-evaluation   Reason for Co-Treatment: For patient/therapist safety;To address functional/ADL transfers PT goals addressed during session: Mobility/safety with mobility;Balance        AM-PAC OT "6 Clicks" Daily Activity     Outcome Measure Help from another person eating meals?: Total Help from another person taking care of personal grooming?: A Little Help from another person toileting, which includes using toliet, bedpan, or urinal?: A Lot Help from another person bathing (including washing, rinsing, drying)?: A Lot Help from another person to put on and taking off regular upper body clothing?: A Lot Help from another person to put on and taking off  regular lower body clothing?: A Lot 6 Click Score: 12   End of Session Equipment Utilized During Treatment: Gait belt Nurse Communication: Mobility status  Activity Tolerance: Patient tolerated treatment well Patient left:  in chair;with call bell/phone within reach  OT Visit Diagnosis: Unsteadiness on feet (R26.81);Muscle weakness (generalized) (M62.81);Other symptoms and signs involving cognitive function;Hemiplegia and hemiparesis Hemiplegia - Right/Left: Right Hemiplegia - dominant/non-dominant: Dominant Hemiplegia - caused by: Cerebral infarction                Time: 1120-1140 OT Time Calculation (min): 20 min Charges:  OT General Charges $OT Visit: 1 Visit OT Evaluation $OT Eval Moderate Complexity: 1 Mod  05/13/2022  RP, OTR/L  Acute Rehabilitation Services  Office:  [phone]   Metta Clines 05/13/2022, 12:54 PM

## 2022-05-13 NOTE — Evaluation (Signed)
Clinical/Bedside Swallow Evaluation Patient Details  Name: Angela Buchanan MRN: [id number] Date of Birth: 06/09/1951  Today's Date: 05/13/2022 Time: SLP Start Time (ACUTE ONLY): 0945 SLP Stop Time (ACUTE ONLY): 1000 SLP Time Calculation (min) (ACUTE ONLY): 15 min  Past Medical History:  Past Medical History:  Diagnosis Date   Atypical chest pain 07/19/2021   Bigeminy    no current med.   Breast cancer ([place]) 06/2017   right   Bruises easily    Carotid bruit 11/13/2019   Dental crowns present    History of diverticulitis    Hypertension    states under control with med., has been on med. x 5 yr.   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   Past Surgical History:  Past Surgical History:  Procedure Laterality Date   ABDOMINAL HYSTERECTOMY     partial   BREAST LUMPECTOMY Right 06/22/2017   Malignant   BREAST LUMPECTOMY WITH RADIOACTIVE SEED AND SENTINEL LYMPH NODE BIOPSY Right 06/22/2017   Procedure: RIGHT BREAST LUMPECTOMY WITH RADIOACTIVE SEED AND RIGHT AXILLARY DEEP SENTINEL LYMPH NODE BIOPSY WITH BLUE DYE INJECTION;  Surgeon: Fanny Skates, MD;  Location: [place];  Service: General;  Laterality: Right;   BREAST LUMPECTOMY WITH RADIOACTIVE SEED LOCALIZATION Left 06/22/2017   Procedure: LEFT BREAST LUMPECTOMY WITH RADIOACTIVE SEED LOCALIZATION;  Surgeon: Fanny Skates, MD;  Location: [place];  Service: General;  Laterality: Left;   CATARACT EXTRACTION W/ INTRAOCULAR LENS  IMPLANT, BILATERAL Bilateral    EXCISION OF BREAST BIOPSY Left 06/22/2017   benign   TONSILLECTOMY     age 44   HPI:  Patient is a 71 y.o. female with PMH: HLD, atipal CP, right breast cancer and carotid bruit who presented to [place] on 05/12/22 with acute right sided weakness and difficulty speaking. In ED patient's NIHSS was 4; she received TNK and sent to [facility] for post-TNK management and stroke work-up. CT head negative for acute intracranial abnormality but MRI brain has not yet been completed. Patient was made NPO awaiting SLP swallow evaluation.    Assessment / Plan / Recommendation  Clinical Impression  Patient presents with clinical s/s of dysphagia as per this bedside/clinical swallow evaluation. No aspiration or penetration observed with trials of thin liquids (water) via cup sips and gelatin. Patient exhibited suspected mildly delayed swallow initiation as well as anterior leakage on right side of mouth and collection of some saliva on right side buccal cavity. She was not able to achieve adequate labial seal to suck through straw. She herself reported that she has had nausea since previous date.  Patient's voice and vitals remained WFL during and after PO intake. SLP is recommending initiate Full liquids diet at this time and SLP will follow to determine readiness to advance with solid PO's. SLP Visit Diagnosis: Dysphagia, unspecified (R13.10)    Aspiration Risk  Mild aspiration risk    Diet Recommendation Thin liquid   Liquid Administration via: Cup Medication Administration: Whole meds with puree Supervision: Patient able to self feed;Intermittent supervision to cue for compensatory strategies Compensations: Slow rate;Small sips/bites Postural Changes: Seated upright at 90 degrees    Other  Recommendations Oral Care Recommendations: Oral care BID;Patient independent with oral care    Recommendations for follow  up therapy are one component of a multi-disciplinary discharge planning process, led by the attending physician.  Recommendations may be updated based on patient status, additional functional criteria and insurance authorization.  Follow up Recommendations Acute inpatient rehab (3hours/day)       Assistance Recommended at Discharge Frequent or constant Supervision/Assistance  Functional Status Assessment Patient has had a recent decline in their functional status and demonstrates the ability to make significant improvements in function in a reasonable and predictable amount of time.  Frequency and Duration min 2x/week  1 week       Prognosis Prognosis for Safe Diet Advancement: Good      Swallow Study   General Date of Onset: 05/12/22 HPI: Patient is a 71 y.o. female with PMH: HLD, atipal CP, right breast cancer and carotid bruit who presented to [place] on 05/12/22 with acute right sided weakness and difficulty speaking. In ED patient's NIHSS was 4; she received TNK and sent to [facility] for post-TNK management and stroke work-up. CT head negative for acute intracranial abnormality but MRI brain has not yet been completed. Patient was made NPO awaiting SLP swallow evaluation. Type of Study: Bedside Swallow Evaluation Previous Swallow Assessment: none found Diet Prior to this Study: NPO Temperature Spikes Noted: No Respiratory Status: Room air History of Recent Intubation: No Behavior/Cognition: Alert;Cooperative;Pleasant mood Oral Cavity Assessment: Within Functional Limits Oral Care Completed by SLP: Yes Oral Cavity - Dentition: Adequate natural dentition Vision: Functional for self-feeding Self-Feeding Abilities: Able to feed self;Needs set up Patient Positioning: Upright in bed Baseline Vocal Quality: Low vocal intensity Volitional Cough: Strong;Weak Volitional Swallow: Able to elicit    Oral/Motor/Sensory Function Overall Oral Motor/Sensory Function: Moderate impairment Facial ROM: Reduced right Facial Symmetry: Abnormal symmetry right Facial Strength: Reduced right Lingual ROM: Reduced right Lingual Symmetry: Within Functional Limits Lingual Strength: Reduced   Ice Chips     Thin Liquid Thin Liquid: Impaired Presentation: Straw;Cup Oral Phase  Impairments: Reduced labial seal;Reduced lingual movement/coordination Oral Phase Functional Implications: Right anterior spillage Pharyngeal  Phase Impairments: Suspected delayed Swallow    Nectar Thick     Honey Thick     Puree Puree: Not tested   Solid     Solid: Impaired Oral Phase Impairments: Reduced labial seal Oral Phase Functional Implications: Right anterior spillage Pharyngeal Phase Impairments: Suspected delayed Swallow     Sonia Baller, MA, CCC-SLP Speech Therapy

## 2022-05-13 NOTE — Progress Notes (Addendum)
Cardiac monitoring reviewed, lowest HR noted SB 39.

## 2022-05-14 DIAGNOSIS — I639 Cerebral infarction, unspecified: Secondary | ICD-10-CM | POA: Diagnosis not present

## 2022-05-14 MED ORDER — PANTOPRAZOLE SODIUM 40 MG PO TBEC
40.0000 mg | DELAYED_RELEASE_TABLET | Freq: Every day | ORAL | Status: DC
  Administered 2022-05-15 – 2022-05-17 (×3): 40 mg via ORAL
  Filled 2022-05-14 (×3): qty 1

## 2022-05-14 MED ORDER — CLOPIDOGREL BISULFATE 75 MG PO TABS
75.0000 mg | ORAL_TABLET | Freq: Every day | ORAL | Status: DC
  Administered 2022-05-14 – 2022-05-17 (×4): 75 mg via ORAL
  Filled 2022-05-14 (×4): qty 1

## 2022-05-14 MED ORDER — PRAVASTATIN SODIUM 40 MG PO TABS
40.0000 mg | ORAL_TABLET | Freq: Every day | ORAL | Status: DC
  Administered 2022-05-14 – 2022-05-17 (×4): 40 mg via ORAL
  Filled 2022-05-14 (×4): qty 1

## 2022-05-14 MED ORDER — ENOXAPARIN SODIUM 40 MG/0.4ML IJ SOSY
40.0000 mg | PREFILLED_SYRINGE | INTRAMUSCULAR | Status: DC
  Administered 2022-05-14 – 2022-05-16 (×3): 40 mg via SUBCUTANEOUS
  Filled 2022-05-14 (×3): qty 0.4

## 2022-05-14 NOTE — Progress Notes (Signed)
Inpatient Rehab Admissions:  Inpatient Rehab Consult received.  I met with patient at the bedside for rehabilitation assessment and to discuss goals and expectations of an inpatient rehab admission.  Discussed average length of stay at CIR. Discussed visitation policy. Discussed 24/7 support after discharge requirement. Pt noted that husband [name] will be able to provide 24/7 support after discharge. Also discussed pre-authorization requirement of pt's insurance. Pt acknowledged understanding. Pt is interested in pursuing CIR. Pt gave permission to contact husband. Spoke with [name] on the telephone. Discussed above CIR expectations. He acknowledged understanding. He is supportive of pt pursuing CIR. He confirmed that he would be able to provide 24/7 support for pt after discharge. Will continue to follow.  Signed: Gayland Curry, [place], [place] Admissions Coordinator [phone]

## 2022-05-14 NOTE — Plan of Care (Signed)
  Problem: Self-Care: Goal: Ability to participate in self-care as condition permits will improve Outcome: Progressing   Problem: Nutrition: Goal: Adequate nutrition will be maintained Outcome: Progressing   Problem: Skin Integrity: Goal: Risk for impaired skin integrity will decrease Outcome: Progressing

## 2022-05-14 NOTE — Progress Notes (Addendum)
STROKE TEAM PROGRESS NOTE   INTERVAL HISTORY  RN at bedside. Patient is awake and alert and sitting up eating breakfast. MRI brain with subacute infarct involving left corona radiata.  Will transfer out of unit. Will consider loop recorder before going to CIR. PLAN TO START ASA and plavix for 3 weeks then Plavix alone  Neurological exam unchanged. Right arm weak, when gravity eliminated able to move arm right leg able to move to against gravity. VSS  MRI scan of the brain yesterday showed a large left subcortical infarct measuring 2.1 x 1.5 x 1.2 cm Vitals:   05/14/22 0300 05/14/22 0400 05/14/22 0500 05/14/22 0600  BP: (!) 152/74 129/71 139/72 (!) 155/84  Pulse: 99 79 74 91  Resp: '14 17 15 17  '$ Temp:  98.1 F (36.7 C)    TempSrc:  Oral    SpO2: 96% 96% 96% 96%  Weight:      Height:       CBC:  Recent Labs  Lab 05/12/22 1545  WBC 4.2  NEUTROABS 2.5  HGB 13.7  HCT 39.8  MCV 100.0  PLT 767    Basic Metabolic Panel:  Recent Labs  Lab 05/12/22 1545  NA 135  K 3.7  CL 102  CO2 23  GLUCOSE 94  BUN 10  CREATININE 0.77  CALCIUM 9.6    Lipid Panel:  Recent Labs  Lab 05/13/22 0137  CHOL 264*  TRIG 56  HDL 86  CHOLHDL 3.1  VLDL 11  LDLCALC 167*    HgbA1c:  Recent Labs  Lab 05/13/22 0137  HGBA1C 5.7*    Urine Drug Screen:  Recent Labs  Lab 05/12/22 2236  LABOPIA NONE DETECTED  COCAINSCRNUR NONE DETECTED  LABBENZ NONE DETECTED  AMPHETMU NONE DETECTED  THCU NONE DETECTED  LABBARB NONE DETECTED     Alcohol Level  Recent Labs  Lab 05/12/22 1545  ETH <10     IMAGING past 24 hours MR BRAIN WO CONTRAST  Result Date: 05/13/2022 CLINICAL DATA:  Right upper and lower extremity weakness. Abnormal speech. EXAM: MRI HEAD WITHOUT CONTRAST TECHNIQUE: Multiplanar, multiecho pulse sequences of the brain and surrounding structures were obtained without intravenous contrast. COMPARISON:  CT head without contrast and CTA head and neck 05/12/2022 FINDINGS: Brain:  Acute/subacute nonhemorrhagic infarct present in the left corona radiata along the lateral margin of the left ventricle. The infarcted territory measures 21 x 15 x 12 mm. Moderate atrophy and confluent periventricular T2 hyperintensity is present separate from the acute infarct. The ventricles are proportionate to the degree of atrophy. No significant extraaxial fluid collection is present. The internal auditory canals are within normal limits. The brainstem and cerebellum are within normal limits. Vascular: Flow is present in the major intracranial arteries. Skull and upper cervical spine: The craniocervical junction is normal. Upper cervical spine is within normal limits. Marrow signal is unremarkable. Sinuses/Orbits: The paranasal sinuses and mastoid air cells are clear. Bilateral lens replacements are noted. Globes and orbits are otherwise unremarkable. IMPRESSION: 1. Acute/subacute nonhemorrhagic infarct involving the left corona radiata along the lateral margin of the left ventricle. 2. Moderate atrophy and confluent periventricular white matter disease otherwise likely reflects the sequela of chronic microvascular ischemia. The above was relayed via text pager to Dr. Rory Percy On 05/13/2022 at 19:47 . Electronically Signed   By: San Morelle M.D.   On: 05/13/2022 19:47   ECHOCARDIOGRAM COMPLETE  Result Date: 05/13/2022    ECHOCARDIOGRAM REPORT   Patient Name:   Angela Buchanan  Date of Exam: 05/13/2022 Medical Rec #:  [id number]     Height:       62.0 in Accession #:    [id number]    Weight:       120.8 lb Date of Birth:  [birth year]     BSA:          1.543 m Patient Age:    61 years      BP:           133/75 mmHg Patient Gender: F             HR:           69 bpm. Exam Location:  Inpatient Procedure: 2D Echo, Cardiac Doppler and Color Doppler Indications:    Stroke  History:        Patient has no prior history of Echocardiogram examinations.  Sonographer:    Merrie Roof RDCS Referring Phys: [place]  1. Left ventricular ejection fraction, by estimation, is 60 to 65%. The left ventricle has normal function. The left ventricle has no regional wall motion abnormalities. Left ventricular diastolic parameters were normal.  2. Right ventricular systolic function is normal. The right ventricular size is normal. Tricuspid regurgitation signal is inadequate for assessing PA pressure.  3. The mitral valve is grossly normal. Trivial mitral valve regurgitation. No evidence of mitral stenosis.  4. The aortic valve is tricuspid. Aortic valve regurgitation is not visualized. No aortic stenosis is present.  5. The inferior vena cava is normal in size with greater than 50% respiratory variability, suggesting right atrial pressure of 3 mmHg. Conclusion(s)/Recommendation(s): No intracardiac source of embolism detected on this transthoracic study. Consider a transesophageal echocardiogram to exclude cardiac source of embolism if clinically indicated. FINDINGS  Left Ventricle: Left ventricular ejection fraction, by estimation, is 60 to 65%. The left ventricle has normal function. The left ventricle has no regional wall motion abnormalities. The left ventricular internal cavity size was normal in size. There is  no left ventricular hypertrophy. Left ventricular diastolic parameters were normal. Right Ventricle: The right ventricular size is normal. No increase in right ventricular wall thickness. Right ventricular systolic function is normal. Tricuspid regurgitation signal is inadequate for assessing PA pressure. Left Atrium: Left atrial size was normal in size. Right Atrium: Right atrial size was normal in size. Pericardium: There is no evidence of pericardial effusion. Mitral Valve: The mitral valve is grossly normal. Trivial mitral valve regurgitation. No evidence of mitral valve stenosis. Tricuspid Valve: The tricuspid valve is grossly normal. Tricuspid valve regurgitation is not demonstrated. No evidence of  tricuspid stenosis. Aortic Valve: The aortic valve is tricuspid. Aortic valve regurgitation is not visualized. No aortic stenosis is present. Pulmonic Valve: The pulmonic valve was grossly normal. Pulmonic valve regurgitation is not visualized. No evidence of pulmonic stenosis. Aorta: The aortic root and ascending aorta are structurally normal, with no evidence of dilitation. Venous: The inferior vena cava is normal in size with greater than 50% respiratory variability, suggesting right atrial pressure of 3 mmHg. IAS/Shunts: The atrial septum is grossly normal.  LEFT VENTRICLE PLAX 2D LVIDd:         3.60 cm   Diastology LVIDs:         2.60 cm   LV e' medial:    10.30 cm/s LV PW:         0.80 cm   LV E/e' medial:  6.4 LV IVS:        0.60 cm  LV e' lateral:   8.05 cm/s LVOT diam:     1.80 cm   LV E/e' lateral: 8.2 LV SV:         56 LV SV Index:   36 LVOT Area:     2.54 cm  RIGHT VENTRICLE RV Basal diam:  2.60 cm LEFT ATRIUM             Index        RIGHT ATRIUM           Index LA diam:        2.60 cm 1.69 cm/m   RA Area:     12.60 cm LA Vol (A2C):   50.2 ml 32.53 ml/m  RA Volume:   27.80 ml  18.02 ml/m LA Vol (A4C):   33.5 ml 21.71 ml/m LA Biplane Vol: 40.9 ml 26.51 ml/m  AORTIC VALVE LVOT Vmax:   108.00 cm/s LVOT Vmean:  67.200 cm/s LVOT VTI:    0.221 m  AORTA Ao Root diam: 2.90 cm Ao Asc diam:  2.80 cm MITRAL VALVE MV Area (PHT): 3.91 cm    SHUNTS MV Decel Time: 194 msec    Systemic VTI:  0.22 m MV E velocity: 65.70 cm/s  Systemic Diam: 1.80 cm MV A velocity: 98.50 cm/s MV E/A ratio:  0.67 Angela Chiquito MD Electronically signed by Angela Chiquito MD Signature Date/Time: 05/13/2022/6:21:45 PM    Final     PHYSICAL EXAM  Temp:  [97.8 F (36.6 C)-98.9 F (37.2 C)] 98.1 F (36.7 C) (08/13 0400) Pulse Rate:  [48-99] 91 (08/13 0600) Resp:  [6-28] 17 (08/13 0600) BP: (129-173)/(64-92) 155/84 (08/13 0600) SpO2:  [93 %-99 %] 96 % (08/13 0600)  General -frail elderly Caucasian lady in no apparent  distress.  Cardiovascular - Regular rhythm and rate.  Mental Status -  Level of arousal and orientation to time, place, and person were intact Speech is dysarthric   Cranial Nerves II - XII - II - Visual field intact OU. III, IV, VI - Extraocular movements intact. V - Facial sensation intact bilaterally. VII - + facial droop on the right VIII - Hearing & vestibular intact bilaterally. X - Palate elevates symmetrically. XI - Chin turning & shoulder shrug intact bilaterally. XII - Tongue protrusion intact.  Motor Strength - R LUE and LLE 5/5 RUE 2-3/5 proximally and distally.  RLE 2/5 proximally, 3/5 distally Sensory: Decreased temp sensation to RUE and RLE. Normal Cerebellar: No ataxia on the left. Unable to perform on the right  ASSESSMENT/PLAN Angela Buchanan is an 71 y.o. female with a history of hypertension, HLD, atypical CP, ventricular bigeminy/PAC/PVC, right breast cancer and carotid bruit, who presented to [facility] with acute right sided weakness and difficulty speaking. She stated that she was writing and suddenly was unable to use her right hand to write and then with difficulty speaking and  right-sided weakness and numbness with difficulty walking which resulted in the decision to be seen in the ED where a Code Stroke was called. NIHSS 4. tNK was administered. Cleviprex was initiated for BP 180/87. CTA showed  No large vessel occlusion, hemodynamically significant stenosis, or evidence of dissection. CTH negative for acute finding. Moderate stenosis left P1-P2 PCA junctio. The patient was then sent to [facility] for post-TNK management and stroke work up.   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 ASPECTS 10.    CTA head &  neck  No large vessel occlusion, hemodynamically significant stenosis, or evidence of dissection Repeat HCT  No acute intracranial abnormality identified. No significant change 2D Echo EF 60-65% LDL 167 HgbA1c 5.7 VTE prophylaxis - Lovenox Full liquid diet  Recommend Loop recorder  On ASA '81mg'$  prior to admission. Now on ASA and Plavix '75mg'$  for 3 weeks then Plavix alone  Therapy recommendations:  CIR Disposition:  TBD  Hypertension Home meds:  toprol, norvasc, lisinopril,  Unstable requiring cleviprex drip Permissive hypertension (OK if < 220/120) but gradually normalize in 5-7 days Long-term BP goal normotensive  Hyperlipidemia Home meds:  Pravastatin '10mg'$ , which was started, increased to '40mg'$   LDL 167, not at goal < 70 Continue statin at discharge  Dysphagia SLP advises full liquid diet for now On NS 75cc/hr, discontinue if able to tolerate diet adquately  Other Stroke Risk Factors Advanced Age >/= 29  Family hx stroke (mat grandparents and paternal grandparents, brother, sister and mother )  Other [facility] day # 2 This patient was seen and evaluated with Dr. Leonie Man. He directed the plan of care.   Beulah Gandy DNP, ACNPC-AG   I have personally obtained history,examined this patient, reviewed notes, independently viewed imaging studies, participated in medical decision making and plan of care.ROS completed by me personally and pertinent positives fully documented  I have made any additions or clarifications directly to the above note. Agree with note above.  Patient continues to have significant right hemiparesis and MRI shows a fairly large left subcortical infarct etiology likely cryptogenic embolism rather than small vessel disease given the large size.  Recommend mobilize out of bed.  Physical occupational and speech therapy consults.  Transfer to neurology floor bed.  Rehab consult and likely transfer to inpatient rehab in a few days and will need a  loop recorder insertion of her A-fib to detect paroxysmal A-fib prior to discharge.  No family available for discussion.  Greater than 50% time during this 50-minute visit was spent in counseling and coordination of care about her embolic stroke and discussion about evaluation, prevention and treatment, answering questions and discussion with care team..  Antony Contras, [place] Pager: [phone] 05/14/2022 1:29 PM  To contact Stroke Continuity provider, please refer to [email or web address] After hours, contact General Neurology

## 2022-05-15 DIAGNOSIS — I639 Cerebral infarction, unspecified: Secondary | ICD-10-CM | POA: Diagnosis not present

## 2022-05-15 NOTE — Progress Notes (Addendum)
STROKE TEAM PROGRESS NOTE   INTERVAL HISTORY  No family is at bedside.  Per patient, speech is unchanged today. Neurologic exam stable. She is awake, alert, sitting on bedside recliner. Patient is able to elevate RLE antigravity with some vertical drift. RLE weakness noted. RUE weak through when gravity eliminated, she has extensor movement of the right arm. RLE knee extension > flexion. Neurologic exams and vital signs are stable.   MRI brain showed a large left subcortical infarct measuring 2.1 x 1.5 x 1.2 cm Plan for loop recorder placement before going to CIR DAPT with ASA and plavix for 3 weeks then Plavix monotherapy  Vitals:   05/14/22 2023 05/14/22 2344 05/15/22 0523 05/15/22 0852  BP: (!) 145/91 (!) 152/87 127/77 (!) 142/79  Pulse: 87 99 92 96  Resp: '20 17 17 19  '$ Temp: 98.1 F (36.7 C) 98.5 F (36.9 C) (!) 97.5 F (36.4 C) 98.2 F (36.8 C)  TempSrc: Oral Oral Oral Oral  SpO2: 98% 96% 98% 97%  Weight:      Height:       CBC:  Recent Labs  Lab 05/12/22 1545  WBC 4.2  NEUTROABS 2.5  HGB 13.7  HCT 39.8  MCV 100.0  PLT 096    Basic Metabolic Panel:  Recent Labs  Lab 05/12/22 1545  NA 135  K 3.7  CL 102  CO2 23  GLUCOSE 94  BUN 10  CREATININE 0.77  CALCIUM 9.6    Lipid Panel:  Recent Labs  Lab 05/13/22 0137  CHOL 264*  TRIG 56  HDL 86  CHOLHDL 3.1  VLDL 11  LDLCALC 167*    HgbA1c:  Recent Labs  Lab 05/13/22 0137  HGBA1C 5.7*    Urine Drug Screen:  Recent Labs  Lab 05/12/22 2236  LABOPIA NONE DETECTED  COCAINSCRNUR NONE DETECTED  LABBENZ NONE DETECTED  AMPHETMU NONE DETECTED  THCU NONE DETECTED  LABBARB NONE DETECTED     Alcohol Level  Recent Labs  Lab 05/12/22 1545  ETH <10    IMAGING past 24 hours No results found.  PHYSICAL EXAM  Temp:  [97.5 F (36.4 C)-98.5 F (36.9 C)] 98.2 F (36.8 C) (08/14 0852) Pulse Rate:  [59-99] 96 (08/14 0852) Resp:  [12-20] 19 (08/14 0852) BP: (126-153)/(62-91) 142/79 (08/14  0852) SpO2:  [95 %-99 %] 97 % (08/14 0852)  General -frail elderly Caucasian lady in no apparent distress.  Cardiovascular - Regular rhythm and rate.  Mental Status -  Level of arousal and orientation to time, place, and person were intact Speech is dysarthric   Cranial Nerves II - XII - II - Visual field intact OU. III, IV, VI - Extraocular movements intact. V - Facial sensation intact bilaterally. VII - + facial droop on the right VIII - Hearing & vestibular intact bilaterally. X - Palate elevates symmetrically. XI - Chin turning & shoulder shrug intact bilaterally. XII - Tongue protrusion intact.  Motor Strength - R LUE and LLE 5/5 RUE 2-3/5 proximally and distally.  RLE 2/5 proximally, 3/5 distally Sensory: Decreased temp sensation to RUE and RLE. Cerebellar: No ataxia on the left. Unable to perform on the right  ASSESSMENT/PLAN SUSANNAH CARBIN is an 71 y.o. female with a history of hypertension, HLD, atypical CP, ventricular bigeminy/PAC/PVC, right breast cancer and carotid bruit, who presented to [facility] with acute right sided weakness and difficulty speaking. She stated that she was writing and suddenly was unable to use her right hand to write and then with  difficulty speaking and  right-sided weakness and numbness with difficulty walking which resulted in the decision to be seen in the ED where a Code Stroke was called. NIHSS 4. tNK was administered. Cleviprex was initiated for BP 180/87. CTA showed  No large vessel occlusion, hemodynamically significant stenosis, or evidence of dissection. CTH negative for acute finding. Moderate stenosis left P1-P2 PCA junctio. The patient was then sent to [facility] for post-TNK management and stroke work up.   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 ASPECTS 10.    CTA head & neck 8/1: No large vessel occlusion, hemodynamically significant stenosis, or evidence of dissection Repeat HCT 8/11: No acute intracranial abnormality identified. No significant change MRI brain wo contrast 8/12: Acute/subacute nonhemorrhagic infarct involving the left corona radiata along the lateral margin of the left ventricle 2D Echo EF 60-65% LDL 167 HgbA1c 5.7 VTE prophylaxis - Lovenox Full liquid diet  Recommend Loop recorder prior to CIR On ASA '81mg'$  prior to admission. Now on ASA and Plavix '75mg'$  for 3 weeks then Plavix alone  Therapy recommendations:  CIR Disposition:  TBD  Hypertension Home meds: toprol, norvasc, lisinopril Unstable requiring cleviprex gtt on arrival. Cleviprex gtt discontinued 8/12 Permissive hypertension (OK if < 220/120) but gradually normalize in 5-7 days Long-term BP goal normotensive  Hyperlipidemia Home meds:  Pravastatin '10mg'$ , which was started, increased to '40mg'$   LDL 167, not at goal < 70 Continue statin at discharge  Dysphagia SLP advises full liquid diet for now On NS 75cc/hr, discontinue if able to tolerate diet adquately  Other Stroke Risk Factors Advanced Age >/= 30  Family hx stroke (mat grandparents and paternal grandparents, brother, sister and mother)  Hospital day # 3 This patient was seen and evaluated with Dr. Leonie Man. He directed the plan of care.  -- Anibal Henderson, AGACNP-BC [facility] [phone]  I have personally obtained history,examined this patient, reviewed notes, independently viewed imaging studies, participated in medical decision making and plan of care.ROS completed by me personally and pertinent positives fully documented  I have made any additions or clarifications directly to the above note. Agree with note above.  Continue ongoing physical occupational therapies and transfer to inpatient rehab when bed  available.  Antony Contras, MD Medical Director [facility] Pager: [phone] 05/15/2022 1:41 PM  To contact Stroke Continuity provider, please refer to [email or web address] After hours, contact General Neurology

## 2022-05-15 NOTE — Care Management Important Message (Signed)
Important Message  Patient Details  Name: Angela Buchanan MRN: [id number] Date of Birth: Nov 26, 1950   Medicare Important Message Given:  Yes     Orbie Pyo 05/15/2022, 2:28 PM

## 2022-05-15 NOTE — Progress Notes (Signed)
Physical Therapy Treatment Patient Details Name: Angela Buchanan MRN: [id number] DOB: [birth year] Today's Date: 05/15/2022   History of Present Illness Pt is a 71 y.o. female who presented 05/12/22 with acute R-sided weakness and difficulty speaking. Pt administered TNK and transferred to [facility]. 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 PMH: bigeminy, breast cancer, carotid bruit, HTN, PAC, PVC, HLD    PT Comments    Pt progressing well towards all goals. Pt remains to have dense R hemiparesis in UE but improved strength in R LE. Focused on R LE strengthening and gait training today. Pt very motivated and is excited about her progress today. Pt given HEP for R LE exercises, pt with good return demonstration. Pt to continue to benefit from AIR upon d/c for maximal functional recovery as pt was indep PTA. Acute PT to cont to follow.    Recommendations for follow up therapy are one component of a multi-disciplinary discharge planning process, led by the attending physician.  Recommendations may be updated based on patient status, additional functional criteria and insurance authorization.  Follow Up Recommendations  Acute inpatient rehab (3hours/day)     Assistance Recommended at Discharge Frequent or constant Supervision/Assistance  Patient can return home with the following A lot of help with walking and/or transfers;A lot of help with bathing/dressing/bathroom;Assistance with cooking/housework;Direct supervision/assist for medications management;Direct supervision/assist for financial management;Assist for transportation;Help with stairs or ramp for entrance   Equipment Recommendations  Rolling walker (2 wheels);BSC/3in1;Wheelchair (measurements PT);Wheelchair cushion (measurements PT) (pending progress)    Recommendations for Other Services Rehab consult     Precautions / Restrictions Precautions Precautions: Fall Restrictions Weight Bearing  Restrictions: No     Mobility  Bed Mobility Overal bed mobility: Needs Assistance Bed Mobility: Supine to Sit     Supine to sit: HOB elevated, +2 for safety/equipment, Mod assist     General bed mobility comments: pt requiring modA for R LE advancement to EOB and minA for trunk elevation, pt unable to use R UE functionally to assist with transfer    Transfers Overall transfer level: Needs assistance Equipment used: 1 person hand held assist Transfers: Sit to/from Stand Sit to Stand: Min assist           General transfer comment: minA to power up and R knee blocked, HHA on L side via tech    Ambulation/Gait Ambulation/Gait assistance: Mod assist, +2 physical assistance, +2 safety/equipment Gait Distance (Feet): 15 Feet Assistive device: 2 person hand held assist Gait Pattern/deviations: Step-to pattern, Decreased step length - right, Decreased dorsiflexion - right, Decreased stride length, Decreased weight shift to left, Decreased weight shift to right, Knees buckling Gait velocity: reduced Gait velocity interpretation: <1.8 ft/sec, indicate of risk for recurrent falls   General Gait Details: Rehab tech provided modA via L HHA, pt's R UE around PT in 3 musketeer style, PT initially maxA to advance R LE pt then began to initiate R hip flexion, modA required for optimal foot placement and max verbal cues to straight R knee in stance phase to prevent buckling   Stairs             Wheelchair Mobility    Modified Rankin (Stroke Patients Only) Modified Rankin (Stroke Patients Only) Pre-Morbid Rankin Score: No symptoms Modified Rankin: Moderately severe disability     Balance Overall balance assessment: Needs assistance Sitting-balance support: Single extremity supported, Feet supported, Bilateral upper extremity supported Sitting balance-Leahy Scale: Fair Sitting balance - Comments: pt able to sit EOB  today with close supervision   Standing balance support:  Bilateral upper extremity supported, During functional activity Standing balance-Leahy Scale: Poor Standing balance comment: Reliant on UE support and up to modAx2                            Cognition Arousal/Alertness: Awake/alert Behavior During Therapy: WFL for tasks assessed/performed Overall Cognitive Status: Impaired/Different from baseline Area of Impairment: Problem solving, Attention                   Current Attention Level: Sustained         Problem Solving: Difficulty sequencing, Requires verbal cues, Requires tactile cues General Comments: Pt follows commands appropriately, but needs cues to sequence weight shifting and advancing legs when stepping. pt appreciative of exercises given and was excited she walked today        Exercises General Exercises - Lower Extremity Ankle Circles/Pumps: AROM, Right, 5 reps, Supine Quad Sets: AROM, Right, 10 reps, Supine (with 5 sec hold) Gluteal Sets: AROM, Both, 10 reps, Supine Long Arc Quad: AAROM, Right, 10 reps, Seated (with epmhasis on eccentric control at the top) Hip Flexion/Marching: AAROM, Right, 10 reps, Seated    General Comments General comments (skin integrity, edema, etc.): VSS on RA      Pertinent Vitals/Pain Pain Assessment Pain Assessment: No/denies pain Faces Pain Scale: No hurt Pain Intervention(s): Monitored during session    Home Living                          Prior Function            PT Goals (current goals can now be found in the care plan section) Acute Rehab PT Goals Patient Stated Goal: to get better PT Goal Formulation: With patient Time For Goal Achievement: 05/27/22 Potential to Achieve Goals: Good Progress towards PT goals: Progressing toward goals    Frequency    Min 4X/week      PT Plan Current plan remains appropriate    Co-evaluation              AM-PAC PT "6 Clicks" Mobility   Outcome Measure  Help needed turning from your back  to your side while in a flat bed without using bedrails?: A Lot Help needed moving from lying on your back to sitting on the side of a flat bed without using bedrails?: A Lot Help needed moving to and from a bed to a chair (including a wheelchair)?: A Lot Help needed standing up from a chair using your arms (e.g., wheelchair or bedside chair)?: A Lot Help needed to walk in hospital room?: A Lot Help needed climbing 3-5 steps with a railing? : Total 6 Click Score: 11    End of Session Equipment Utilized During Treatment: Gait belt Activity Tolerance: Patient tolerated treatment well Patient left: in chair;with call bell/phone within reach;with nursing/sitter in room Nurse Communication: Mobility status PT Visit Diagnosis: Unsteadiness on feet (R26.81);Other abnormalities of gait and mobility (R26.89);Muscle weakness (generalized) (M62.81);Difficulty in walking, not elsewhere classified (R26.2);Other symptoms and signs involving the nervous system (R29.898);Hemiplegia and hemiparesis Hemiplegia - Right/Left: Right Hemiplegia - dominant/non-dominant: Dominant     Time: 1005-1030 PT Time Calculation (min) (ACUTE ONLY): 25 min  Charges:  $Gait Training: 8-22 mins $Therapeutic Exercise: 8-22 mins                     Chieko Neises  Khamya Topp, PT, DPT Acute Rehabilitation Services Secure chat preferred Office #: [phone]    Berline Lopes 05/15/2022, 1:04 PM

## 2022-05-15 NOTE — Progress Notes (Signed)
Speech Language Pathology Treatment: Dysphagia;Cognitive-Linquistic  Patient Details Name: Angela Buchanan MRN: [id number] DOB: 05-26-1951 Today's Date: 05/15/2022 Time: 6144-3154 SLP Time Calculation (min) (ACUTE ONLY): 33 min  Assessment / Plan / Recommendation Clinical Impression  Pt believes that her speech has started to improve a little bit, but that it is still a far from her baseline. Imprecise articulation and soft phonation observed but still overall intelligible. She reports having difficulty especially when speaking over the call bell. SLP provided education, demonstrations, and opportunities to practice use of speech intelligibility strategies.   Pt also feels ready for some solid foods, and consumed both applesauce and graham crackers without overt difficulty observed but with pt reporting that she is biting her tongue if taking bigger bites. She says this is improved when she is cued to take smaller ones. She is able to suck thin liquid up via straw but it results in immediate coughing. No other coughing is noted by SLP, but she reports coughing while drinking during meals since admission. Recommend starting solid foods that are finely chopped (Dys 2) but also proceeding with MBS in light of persistent signs of dysphagia. Will be completed as can be scheduled with radiology.   HPI HPI: Patient is a 71 y.o. female with PMH: HLD, atipal CP, right breast cancer and carotid bruit who presented to [place] [facility] on 05/12/22 with acute right sided weakness and difficulty speaking. In ED patient's NIHSS was 4; she received TNK and sent to [facility] for post-TNK management and stroke work-up. CT head negative for acute intracranial abnormality but MRI brain has not yet been completed. Patient was made NPO awaiting SLP swallow evaluation.      SLP Plan  MBS      Recommendations for follow up therapy are one component of a multi-disciplinary discharge planning process, led by the attending physician.   Recommendations may be updated based on patient status, additional functional criteria and insurance authorization.    Recommendations  Diet recommendations: Dysphagia 2 (fine chop);Thin liquid Liquids provided via: Cup;No straw Medication Administration: Whole meds with puree Supervision: Patient able to self feed;Intermittent supervision to cue for compensatory strategies Compensations: Slow rate;Small sips/bites Postural Changes and/or Swallow Maneuvers: Seated upright 90 degrees                Oral Care Recommendations: Oral care BID Follow Up Recommendations: Acute inpatient rehab (3hours/day) Assistance recommended at discharge: Frequent or constant Supervision/Assistance SLP Visit Diagnosis: Dysphagia, unspecified (R13.10) Plan: MBS           Osie Bond., M.A. [place] Office [phone]  Secure chat preferred   05/15/2022, 5:19 PM

## 2022-05-15 NOTE — Consult Note (Signed)
ELECTROPHYSIOLOGY CONSULT NOTE  Patient ID: Angela Buchanan MRN: [id number], DOB/AGE: Apr 22, 1951   Admit date: 05/12/2022 Date of Consult: 05/15/2022  Primary Physician: Glenis Smoker, MD Primary Cardiologist: Dr. Oval Linsey Reason for Consultation: Cryptogenic stroke -; recommendations regarding Implantable Loop Recorder, requested by Dr. Leonie Man  History of Present Illness Angela Buchanan was admitted on 05/12/2022 with acute R sided weakness and difficult speech and ambulation.   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  she has undergone workup for stroke including echocardiogram and carotid dopplers.  The patient has been monitored on telemetry which has demonstrated sinus rhythm with no arrhythmias.  I  Neurology has deferred TEE Echocardiogram this admission demonstrated   1. Left ventricular ejection fraction, by estimation, is 60 to 65%. The  left ventricle has normal function. The left ventricle has no regional  wall motion abnormalities. Left ventricular diastolic parameters were  normal.   2. Right ventricular systolic function is normal. The right ventricular  size is normal. Tricuspid regurgitation signal is inadequate for assessing  PA pressure.   3. The mitral valve is grossly normal. Trivial mitral valve  regurgitation. No evidence of mitral stenosis.   4. The aortic valve is tricuspid. Aortic valve regurgitation is not  visualized. No aortic stenosis is present.   5. The inferior  vena cava is normal in size with greater than 50%  respiratory variability, suggesting right atrial pressure of 3 mmHg.   Conclusion(s)/Recommendation(s): No intracardiac source of embolism  detected on this transthoracic study. Consider a transesophageal  echocardiogram to exclude cardiac source of embolism if clinically  indicated.   Lab work is reviewed.  Prior to admission, the patient denies chest pain, shortness of breath, dizziness, palpitations, or syncope.  They are recovering from their stroke with plans to CIR at discharge.    Past Medical History:  Diagnosis Date   Atypical chest pain 07/19/2021   Bigeminy    no current med.   Breast cancer ([place]) 06/2017   right   Bruises easily    Carotid bruit 11/13/2019   Dental crowns present    History of diverticulitis    Hypertension    states under control with med., has been on med. x 5 yr.   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     Surgical History:  Past Surgical History:  Procedure Laterality Date   ABDOMINAL HYSTERECTOMY     partial   BREAST LUMPECTOMY Right 06/22/2017   Malignant   BREAST LUMPECTOMY WITH RADIOACTIVE SEED AND SENTINEL LYMPH NODE BIOPSY Right 06/22/2017   Procedure: RIGHT BREAST LUMPECTOMY WITH RADIOACTIVE SEED AND RIGHT AXILLARY DEEP SENTINEL LYMPH NODE BIOPSY WITH BLUE DYE INJECTION;  Surgeon: Fanny Skates, MD;  Location: [place];  Service: General;  Laterality: Right;   BREAST LUMPECTOMY  WITH RADIOACTIVE SEED LOCALIZATION Left 06/22/2017   Procedure: LEFT BREAST LUMPECTOMY WITH RADIOACTIVE SEED LOCALIZATION;  Surgeon: Fanny Skates, MD;  Location: [place];  Service: General;  Laterality: Left;   CATARACT  EXTRACTION W/ INTRAOCULAR LENS  IMPLANT, BILATERAL Bilateral    EXCISION OF BREAST BIOPSY Left 06/22/2017   benign   TONSILLECTOMY     age 2     Medications Prior to Admission  Medication Sig Dispense Refill Last Dose   ALPRAZolam (XANAX) 0.25 MG tablet Take 0.25 mg by mouth daily as needed for anxiety.   Past Week   amLODipine (NORVASC) 5 MG tablet Take 1 tablet (5 mg total) by mouth daily. 90 tablet 3 05/14/2022   aspirin EC 81 MG tablet Take 1 tablet (81 mg total) by mouth daily.   05/14/2022   b complex vitamins tablet Take 1 tablet by mouth daily.   05/14/2022   Calcium-Magnesium-Vitamin D 600-300-400 LIQD Take 1 tablet by mouth daily.   05/12/2022   Cholecalciferol (VITAMIN D) 50 MCG (2000 UT) CAPS Take 1 capsule by mouth daily.   05/14/2022   citalopram (CELEXA) 20 MG tablet Take 1 tablet by mouth daily at 12 noon.   05/14/2022   Cyanocobalamin (VITAMIN B12) 1000 MCG TBCR Take 1 tablet by mouth daily at 12 noon.   05/14/2022   diclofenac Sodium (VOLTAREN) 1 % GEL Apply 2 g topically 4 (four) times daily as needed. (Patient taking differently: Apply 2 g topically 4 (four) times daily as needed (for pain).) 50 g 1 Past Month   fluticasone (FLONASE) 50 MCG/ACT nasal spray Place 1 spray into both nostrils daily as needed for allergies.   Past Week   hydrocortisone 2.5 % cream Apply 1 Application topically daily as needed (for skin rash).   Past Week   ibuprofen (ADVIL) 800 MG tablet Take 800 mg by mouth every 6 (six) hours as needed for headache or mild pain.   Past Week   levocetirizine (XYZAL) 5 MG tablet Take 5 mg by mouth daily as needed for allergies.   Past Month   levothyroxine (SYNTHROID) 25 MCG tablet Take 25 mcg by mouth daily before breakfast.   05/14/2022   lisinopril (ZESTRIL) 40 MG tablet Take 1 tablet (40 mg total) by mouth daily. 90 tablet 2 05/14/2022   metoprolol succinate (TOPROL-XL) 25 MG 24 hr tablet Take 1 tablet (25 mg total) by mouth daily. 90 tablet 3 05/13/2022 at 2200    Multiple Vitamin (MULTIVITAMIN WITH MINERALS) TABS tablet Take 1 tablet by mouth daily.   05/11/2022   nitroGLYCERIN (NITROSTAT) 0.4 MG SL tablet Place 1 tablet (0.4 mg total) under the tongue every 5 (five) minutes as needed for chest pain. 25 tablet PRN unknown   traMADol (ULTRAM) 50 MG tablet Take 0.5-1 tablets (25-50 mg total) by mouth every 6 (six) hours as needed. (Patient taking differently: Take 50 mg by mouth every 6 (six) hours as needed for moderate pain.) 30 tablet 0 Past Month   anastrozole (ARIMIDEX) 1 MG tablet Take 1 tablet (1 mg total) by mouth daily. (Patient not taking: Reported on 05/14/2022) 90 tablet 3 Not Taking   clobetasol ointment (TEMOVATE) 2.53 % 1 application to affected area (Patient not taking: Reported on 05/14/2022)   Not Taking   pravastatin (PRAVACHOL) 10 MG tablet Take 1 tablet (10 mg total) by mouth daily. (Patient not taking: Reported on 05/14/2022) 90 tablet 3 Not Taking   PROLIA 60 MG/ML SOSY injection Inject into the skin. (  Patient not taking: Reported on 05/14/2022)   Not Taking    Inpatient Medications:   amLODipine  5 mg Oral Daily   anastrozole  1 mg Oral Daily   aspirin EC  81 mg Oral Daily   B-complex with vitamin C  1 tablet Oral Daily   calcium-vitamin D  1 tablet Oral Q breakfast   Chlorhexidine Gluconate Cloth  6 each Topical Daily   cholecalciferol  2,000 Units Oral Daily   citalopram  20 mg Oral Q1200   clopidogrel  75 mg Oral Daily   cyanocobalamin  1,000 mcg Oral Q1200   enoxaparin (LOVENOX) injection  40 mg Subcutaneous Q24H   levothyroxine  25 mcg Oral Q0600   lisinopril  40 mg Oral Daily   pantoprazole  40 mg Oral Q1200   pravastatin  40 mg Oral Daily    Allergies:  Allergies  Allergen Reactions   Atenolol Other (See Comments)    CHEST PAIN   Bisoprolol Other (See Comments)    "FEELS WEIRD"   Prednisone Other (See Comments)    UNABLE TO FOCUS   Amoxicillin-Pot Clavulanate Diarrhea and Other (See Comments)   Hctz  [Hydrochlorothiazide]     Hyponatremia    Rosuvastatin     myalgias   Latex Rash    Social History   Socioeconomic History   Marital status: Married    Spouse name: Not on file   Number of children: 0   Years of education: college   Highest education level: Not on file  Occupational History   Occupation: Retried Product manager: [employer]  Tobacco Use   Smoking status: Never   Smokeless tobacco: Never  Vaping Use   Vaping Use: Never used  Substance and Sexual Activity   Alcohol use: No    Alcohol/week: 0.0 standard drinks of alcohol   Drug use: No   Sexual activity: Never    Birth control/protection: Surgical    Comment: hyst  Other Topics Concern   Not on file  Social History Narrative   Patient lives at home with husband  ([name]).    Patient is rtired caoll   Right handed.   Caffeine 8oz . Caffeine daily   Social Determinants of Health   Financial Resource Strain: Not on file  Food Insecurity: Not on file  Transportation Needs: Not on file  Physical Activity: Not on file  Stress: Not on file  Social Connections: Not on file  Intimate Partner Violence: Not on file     Family History  Problem Relation Age of Onset   Hypertension Sister    Dementia Father    Stroke Mother    Stroke Maternal Grandmother    Stroke Maternal Grandfather    Stroke Paternal Grandmother    Stroke Paternal Grandfather    Stroke Sister    Stroke Brother    Dementia Brother    Heart attack Brother       Review of Systems: All other systems reviewed and are otherwise negative except as noted above.  Physical Exam: Vitals:   05/14/22 2344 05/15/22 0523 05/15/22 0852 05/15/22 1232  BP: (!) 152/87 127/77 (!) 142/79 (!) 143/88  Pulse: 99 92 96 99  Resp: '17 17 19 20  '$ Temp: 98.5 F (36.9 C) (!) 97.5 F (36.4 C) 98.2 F (36.8 C) (!) 97.3 F (36.3 C)  TempSrc: Oral Oral Oral Oral  SpO2: 96% 98% 97% 99%  Weight:      Height:  GEN- The patient is well  appearing, alert and oriented x 3 today.   Head- normocephalic, atraumatic Eyes-  Sclera clear, conjunctiva pink Ears- hearing intact Oropharynx- clear Neck- supple Lungs- CTA b/l, normal work of breathing Heart- RRR, no murmurs, rubs or gallops  GI- soft, NT, ND Extremities- no clubbing, cyanosis, or edema MS- no significant deformity or atrophy Skin- no rash or lesion Psych- euthymic mood, full affect   Labs:   Lab Results  Component Value Date   WBC 4.2 05/12/2022   HGB 13.7 05/12/2022   HCT 39.8 05/12/2022   MCV 100.0 05/12/2022   PLT 378 05/12/2022    Recent Labs  Lab 05/12/22 1545  NA 135  K 3.7  CL 102  CO2 23  BUN 10  CREATININE 0.77  CALCIUM 9.6  PROT 7.6  BILITOT 0.5  ALKPHOS 90  ALT 17  AST 24  GLUCOSE 94   No results found for: "CKTOTAL", "CKMB", "CKMBINDEX", "TROPONINI" Lab Results  Component Value Date   CHOL 264 (H) 05/13/2022   CHOL 174 01/14/2020   CHOL 225 (H) 11/24/2019   Lab Results  Component Value Date   HDL 86 05/13/2022   HDL 75 01/14/2020   HDL 76 11/24/2019   Lab Results  Component Value Date   LDLCALC 167 (H) 05/13/2022   LDLCALC 72 01/14/2020   LDLCALC 132 (H) 11/24/2019   Lab Results  Component Value Date   TRIG 56 05/13/2022   TRIG 160 (H) 01/14/2020   TRIG 98 11/24/2019   Lab Results  Component Value Date   CHOLHDL 3.1 05/13/2022   CHOLHDL 2.3 01/14/2020   CHOLHDL 3.0 11/24/2019   No results found for: "LDLDIRECT"  No results found for: "DDIMER"   Radiology/Studies:  MR BRAIN WO CONTRAST Result Date: 05/13/2022 CLINICAL DATA:  Right upper and lower extremity weakness. Abnormal speech. EXAM: MRI HEAD WITHOUT CONTRAST TECHNIQUE: Multiplanar, multiecho pulse sequences of the brain and surrounding structures were obtained without intravenous contrast. COMPARISON:  CT head without contrast and CTA head and neck 05/12/2022 FINDINGS: Brain: Acute/subacute nonhemorrhagic infarct present in the left corona radiata  along the lateral margin of the left ventricle. The infarcted territory measures 21 x 15 x 12 mm. Moderate atrophy and confluent periventricular T2 hyperintensity is present separate from the acute infarct. The ventricles are proportionate to the degree of atrophy. No significant extraaxial fluid collection is present. The internal auditory canals are within normal limits. The brainstem and cerebellum are within normal limits. Vascular: Flow is present in the major intracranial arteries. Skull and upper cervical spine: The craniocervical junction is normal. Upper cervical spine is within normal limits. Marrow signal is unremarkable. Sinuses/Orbits: The paranasal sinuses and mastoid air cells are clear. Bilateral lens replacements are noted. Globes and orbits are otherwise unremarkable. IMPRESSION: 1. Acute/subacute nonhemorrhagic infarct involving the left corona radiata along the lateral margin of the left ventricle. 2. Moderate atrophy and confluent periventricular white matter disease otherwise likely reflects the sequela of chronic microvascular ischemia. The above was relayed via text pager to Dr. Rory Percy On 05/13/2022 at 19:47 . Electronically Signed   By: San Morelle M.D.   On: 05/13/2022 19:47    CT HEAD WO CONTRAST (5MM) Result Date: 05/12/2022 CLINICAL DATA:  Follow-up examination for neuro deficit, stroke suspected. EXAM: CT HEAD WITHOUT CONTRAST TECHNIQUE: Contiguous axial images were obtained from the base of the skull through the vertex without intravenous contrast. RADIATION DOSE REDUCTION: This exam was performed according to the departmental dose-optimization  program which includes automated exposure control, adjustment of the mA and/or kV according to patient size and/or use of iterative reconstruction technique. COMPARISON:  Multiple previous CTs from earlier the same day. FINDINGS: Brain: Stable atrophy with chronic small vessel ischemic disease. No acute intracranial hemorrhage. No  visible acute or large vessel territory infarct. No mass lesion, mass effect or midline shift. No hydrocephalus or extra-axial fluid collection. Vascular: Residual contrast material seen within the intracranial circulation. Scattered vascular calcifications noted within the carotid siphons. Skull: Scalp soft tissues and calvarium demonstrate no new finding. Sinuses/Orbits: Globes orbital soft tissues within normal limits. Paranasal sinuses and mastoid air cells are clear. Other: None. IMPRESSION: 1. Stable head CT. No visible acute or evolving ischemia or other acute intracranial abnormality. 2. Stable atrophy with chronic small vessel ischemic disease. Electronically Signed   By: Jeannine Boga M.D.   On: 05/12/2022 19:35   CT Head Wo Contrast Result Date: 05/12/2022 CLINICAL DATA:  Worsening weakness on the left. Slurred speech. Neuro deficit. EXAM: CT HEAD WITHOUT CONTRAST TECHNIQUE: Contiguous axial images were obtained from the base of the skull through the vertex without intravenous contrast. RADIATION DOSE REDUCTION: This exam was performed according to the departmental dose-optimization program which includes automated exposure control, adjustment of the mA and/or kV according to patient size and/or use of iterative reconstruction technique. COMPARISON:  Noncontrast CT of the brain May 12, 2022. CT angiogram of the brain May 12, 2022. FINDINGS: Brain: Significant contrast remains in the vessels and dural sinuses. White matter changes are stable and moderate. No acute cortical ischemia or infarct. Ventricles and sulci are stable. Cerebellum, brainstem, and basal cisterns are stable. No evidence of subdural, epidural, or subarachnoid hemorrhage. Vascular: Calcified atherosclerotic changes are identified in the intracranial carotids. Skull: Normal. Negative for fracture or focal lesion. Sinuses/Orbits: No acute finding. Other: None. IMPRESSION: No acute intracranial abnormality identified. No  significant change since the noncontrast CT scan earlier today. Electronically Signed   By: Dorise Bullion III M.D.   On: 05/12/2022 17:44   CT ANGIO HEAD NECK W WO CM (CODE STROKE) Result Date: 05/12/2022 CLINICAL DATA:  Neuro deficit, acute, stroke suspected EXAM: CT ANGIOGRAPHY HEAD AND NECK TECHNIQUE: Multidetector CT imaging of the head and neck was performed using the standard protocol during bolus administration of intravenous contrast. Multiplanar CT image reconstructions and MIPs were obtained to evaluate the vascular anatomy. Carotid stenosis measurements (when applicable) are obtained utilizing NASCET criteria, using the distal internal carotid diameter as the denominator. RADIATION DOSE REDUCTION: This exam was performed according to the departmental dose-optimization program which includes automated exposure control, adjustment of the mA and/or kV according to patient size and/or use of iterative reconstruction technique. CONTRAST:  68m OMNIPAQUE IOHEXOL 350 MG/ML SOLN COMPARISON:  None Available. FINDINGS: CTA NECK Aortic arch: Unremarkable.  Great vessel origins are patent. Right carotid system: Patent.  No stenosis. Left carotid system: Patent.  No stenosis. Vertebral arteries: Patent and codominant. No stenosis or evidence of dissection. Skeleton: Mild cervical spine degenerative changes. Other neck: Right thyroid nodule previously evaluated by ultrasound. Upper chest: No apical lung mass. Review of the MIP images confirms the above findings CTA HEAD Anterior circulation: Intracranial internal carotid arteries are patent with minimal calcified plaque. Anterior cerebral arteries are patent. Middle cerebral arteries are patent. Posterior circulation: Intracranial vertebral arteries patent. Basilar artery is patent. Major cerebellar artery origins are patent. Left posterior communicating artery is present. Posterior cerebral arteries are patent with fetal origin on. Moderate stenosis  at the left  P1-P2 junction. Venous sinuses: Patent as allowed by contrast bolus timing. Review of the MIP images confirms the above findings IMPRESSION: No large vessel occlusion, hemodynamically significant stenosis, or evidence of dissection. Moderate stenosis left P1-P2 PCA junction. Electronically Signed   By: Macy Mis M.D.   On: 05/12/2022 16:39   CT HEAD CODE STROKE WO CONTRAST Result Date: 05/12/2022 CLINICAL DATA:  Code stroke. Neuro deficit, acute, stroke suspected. Right arm numbness and leg weakness with speech disturbance. EXAM: CT HEAD WITHOUT CONTRAST TECHNIQUE: Contiguous axial images were obtained from the base of the skull through the vertex without intravenous contrast. RADIATION DOSE REDUCTION: This exam was performed according to the departmental dose-optimization program which includes automated exposure control, adjustment of the mA and/or kV according to patient size and/or use of iterative reconstruction technique. COMPARISON:  None Available. FINDINGS: Brain: There is no evidence of an acute infarct, intracranial hemorrhage, mass, midline shift, or extra-axial fluid collection. Mild prominence of the lateral ventricles is favored to reflect central predominant cerebral atrophy. Patchy hypodensities in the cerebral white matter bilaterally are nonspecific but compatible with moderate chronic small vessel ischemic disease. Vascular: Calcified atherosclerosis at the skull base. No hyperdense vessel. Skull: No fracture or suspicious osseous lesion. Sinuses/Orbits: Visualized paranasal sinuses and mastoid air cells are clear. Visualized orbits are unremarkable. Other: None. ASPECTS [facility] Stroke Program Early CT Score) - Ganglionic level infarction (caudate, lentiform nuclei, internal capsule, insula, M1-M3 cortex): 7 - Supraganglionic infarction (M4-M6 cortex): 3 Total score (0-10 with 10 being normal): 10 IMPRESSION: 1. No evidence of acute intracranial abnormality. ASPECTS of 10. 2. Moderate  chronic small vessel ischemic disease. These results were called by telephone at the time of interpretation on 05/12/2022 at 4:01 pm to Dr. Fredia Sorrow, who verbally acknowledged these results. Electronically Signed   By: Logan Bores M.D.   On: 05/12/2022 16:03    12-lead ECG SR All prior EKG's in EPIC reviewed with no documented atrial fibrillation  Telemetry SR/ST  Assessment and Plan:  1. Cryptogenic stroke The patient presents with cryptogenic stroke.   Dr. Quentin Ore spoke at length with the patient and her husband bedside about monitoring for afib with either a 30 day event monitor or an implantable loop recorder.  Risks, benefits, and alteratives to implantable loop recorder were discussed with the patient today.   At this time, the patient is very clear in their decision to proceed with implantable loop recorder.   Wound care was reviewed with the patient (keep incision clean and dry for 3 days).  Wound check follow up will be scheduled for the patient.  Please call with questions.   Mahlia Fernando Dyane Dustman, PA-C 05/15/2022

## 2022-05-15 NOTE — NC FL2 (Signed)
[place] LEVEL OF CARE SCREENING TOOL     IDENTIFICATION  Patient Name: Angela Buchanan Birthdate: 1950/10/09 Sex: female Admission Date (Current Location): 05/12/2022  [facility] and [place] Number:  Herbalist and Address:  [facility], [place] 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   CVA (cerebral vascular accident) ([place]) 05/12/2022   Osteoporosis 01/24/2022   Atypical chest pain 07/19/2021   PAC (premature atrial contraction) 05/11/2020   PVC (premature ventricular contraction) 05/11/2020   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   Carotid bruit 11/13/2019   Pure hypercholesterolemia 11/13/2019   Malignant neoplasm of upper-outer quadrant of right breast in female, estrogen receptor positive ([place]) 05/22/2017   Diplopia 09/17/2014   Dizziness and giddiness 09/17/2014   Essential hypertension 07/22/2014   Chest pain 07/22/2014   Family history of heart disease 07/22/2014    Orientation RESPIRATION BLADDER Height & Weight     Self, Time, Situation, Place  Normal Continent Weight: 120 lb 12.8 oz (54.8 kg) Height:  '5\' 2"'$  (157.5 cm)  BEHAVIORAL SYMPTOMS/MOOD NEUROLOGICAL BOWEL NUTRITION STATUS      Continent Diet (see DC summary)  AMBULATORY STATUS COMMUNICATION OF NEEDS Skin   Limited Assist Verbally Normal                       Personal Care Assistance Level of Assistance  Bathing, Feeding, Dressing Bathing Assistance: Limited assistance Feeding assistance: Limited assistance Dressing Assistance: Limited  assistance     Functional Limitations Info  Sight, Speech Sight Info: Impaired (glasses)   Speech Info: Impaired (dysarthria)    SPECIAL CARE FACTORS FREQUENCY  OT (By licensed OT), PT (By licensed PT)     PT Frequency: 5x/wk OT Frequency: 5x/wk            Contractures Contractures Info: Not present    Additional Factors Info  Code Status, Allergies, Psychotropic Code Status Info: Full Allergies Info: Atenolol, Bisoprolol, Prednisone, Amoxicillin-pot Clavulanate, Hctz (Hydrochlorothiazide), Rosuvastatin, Latex Psychotropic Info: Celexa '20mg'$  daily         Current Medications (05/15/2022):  This is the current hospital active medication list Current Facility-Administered Medications  Medication Dose Route Frequency Provider Last Rate Last Admin   acetaminophen (TYLENOL) suppository 325 mg  325 mg Rectal Q4H PRN Amie Portland, MD       acetaminophen (TYLENOL) tablet 650 mg  650 mg Oral Q6H PRN Bailey-Modzik, Delila A, NP   650 mg at 05/14/22 0102   ALPRAZolam (XANAX) tablet 0.25 mg  0.25 mg Oral QHS PRN Kerney Elbe, MD       amLODipine (NORVASC) tablet 5 mg  5 mg Oral Daily Kerney Elbe, MD   5 mg at 05/15/22 1055   anastrozole (ARIMIDEX) tablet 1 mg  1 mg Oral Daily Kerney Elbe, MD   1 mg at 05/15/22 1055   aspirin EC tablet 81 mg  81 mg Oral Daily Amie Portland, MD   81 mg at 05/15/22 1055   B-complex with vitamin C tablet 1 tablet  1 tablet Oral Daily Kerney Elbe, MD   1 tablet at 05/15/22 1053   calcium-vitamin D (OSCAL WITH D) 500-5 MG-MCG per tablet 1 tablet  1 tablet Oral Q breakfast Kerney Elbe, MD   1 tablet at 05/15/22 1053   Chlorhexidine Gluconate Cloth 2 % PADS 6 each  6 each Topical Daily Amie Portland, MD   6 each at 05/13/22 1730   cholecalciferol (VITAMIN D3) 25 MCG (1000 UNIT) tablet 2,000 Units  2,000 Units Oral Daily Kerney Elbe, MD   2,000 Units at 05/15/22 1055   citalopram (CELEXA) tablet 20 mg  20 mg Oral Q1200 Kerney Elbe, MD   20 mg at  05/15/22 1054   clobetasol ointment (TEMOVATE) 0.05 %   Topical BID PRN Kerney Elbe, MD       clopidogrel (PLAVIX) tablet 75 mg  75 mg Oral Daily Beulah Gandy A, NP   75 mg at 05/15/22 1055   cyanocobalamin (VITAMIN B12) tablet 1,000 mcg  1,000 mcg Oral Q1200 Kerney Elbe, MD   1,000 mcg at 05/15/22 1349   enoxaparin (LOVENOX) injection 40 mg  40 mg Subcutaneous Q24H Beulah Gandy A, NP   40 mg at 05/14/22 2116   levothyroxine (SYNTHROID) tablet 25 mcg  25 mcg Oral Q0600 Kerney Elbe, MD   25 mcg at 05/15/22 0521   lisinopril (ZESTRIL) tablet 40 mg  40 mg Oral Daily Kerney Elbe, MD   40 mg at 05/15/22 1053   ondansetron (ZOFRAN) injection 4 mg  4 mg Intravenous Q6H PRN Bailey-Modzik, Delila A, NP   4 mg at 05/13/22 1101   Oral care mouth rinse  15 mL Mouth Rinse PRN Kerney Elbe, MD       pantoprazole (PROTONIX) EC tablet 40 mg  40 mg Oral Q1200 Garvin Fila, MD   40 mg at 05/15/22 1349   pravastatin (PRAVACHOL) tablet 40 mg  40 mg Oral Daily Beulah Gandy A, NP   40 mg at 05/15/22 1055   senna-docusate (Senokot-S) tablet 1 tablet  1 tablet Oral QHS PRN Kerney Elbe, MD         Discharge Medications: Please see discharge summary for a list of discharge medications.  Relevant Imaging Results:  Relevant Lab Results:   Additional Information SS#: [id number]  Geralynn Ochs, LCSW

## 2022-05-15 NOTE — Progress Notes (Signed)
Inpatient Rehab Admissions Coordinator:  Saw pt at bedside. Discussed rehab options (CIR versus SNF). Given pt's concern for husband's health and his ability to assist her after ~2 weeks of therapy, she and her husband have decided it is best for her to pursue SNF. TOC made aware. AC will sign off.   Gayland Curry, [place], [place] Admissions Coordinator [phone]

## 2022-05-15 NOTE — Progress Notes (Signed)
Occupational Therapy Treatment Patient Details Name: Angela Buchanan MRN: [id number] DOB: [birth year] Today's Date: 05/15/2022   History of present illness Pt is a 71 y.o. female who presented 05/12/22 with acute R-sided weakness and difficulty speaking. Pt administered TNK and transferred to [facility]. 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 PMH: bigeminy, breast cancer, carotid bruit, HTN, PAC, PVC, HLD   OT comments  Pt progressing towards goals, able to stand at sink for ~8 mins with min A to complete standing grooming tasks using LUE and WB through RUE. Pt with RLE motor impersistence, needing mod verbal/tactile cues to straighten RLE when standing. Performed PROM and educated pt on self ROM and positioning techniques for RUE, pt able to demo. Pt presenting with impairments listed below, will follow acutely. Continue to recommend AIR at d/c.   Recommendations for follow up therapy are one component of a multi-disciplinary discharge planning process, led by the attending physician.  Recommendations may be updated based on patient status, additional functional criteria and insurance authorization.    Follow Up Recommendations  Acute inpatient rehab (3hours/day)    Assistance Recommended at Discharge Frequent or constant Supervision/Assistance  Patient can return home with the following  A lot of help with bathing/dressing/bathroom;Two people to help with walking and/or transfers;Help with stairs or ramp for entrance;Assist for transportation;Assistance with cooking/housework   Equipment Recommendations  BSC/3in1;Tub/shower seat;Wheelchair (measurements OT);Wheelchair cushion (measurements OT)    Recommendations for Other Services Rehab consult    Precautions / Restrictions Precautions Precautions: Fall Restrictions Weight Bearing Restrictions: No       Mobility Bed Mobility               General bed mobility comments: OOB in chair upon  arrival    Transfers Overall transfer level: Needs assistance Equipment used: 1 person hand held assist Transfers: Sit to/from Stand Sit to Stand: Min assist           General transfer comment: min A with pt pulling up from sink     Balance Overall balance assessment: Needs assistance Sitting-balance support: Single extremity supported, Feet supported, Bilateral upper extremity supported Sitting balance-Leahy Scale: Fair     Standing balance support: Bilateral upper extremity supported, During functional activity Standing balance-Leahy Scale: Poor Standing balance comment: Reliant on UE support and up to modAx2                           ADL either performed or assessed with clinical judgement   ADL Overall ADL's : Needs assistance/impaired     Grooming: Oral care;Wash/dry face;Wash/dry hands;Brushing hair;Standing Grooming Details (indicate cue type and reason): stood at sink for ~8 mins for ADLs                             Functional mobility during ADLs: Minimal assistance      Extremity/Trunk Assessment Upper Extremity Assessment Upper Extremity Assessment: RUE deficits/detail RUE Deficits / Details: No AROM noted to elbow distal.  Trace to shoulder flex/ext and ABd/ADd RUE Sensation: decreased light touch RUE Coordination: decreased fine motor;decreased gross motor   Lower Extremity Assessment Lower Extremity Assessment: Defer to PT evaluation        Vision       Perception Perception Perception: Within Functional Limits   Praxis Praxis Praxis: Not tested Praxis-Other Comments: RLE motor impersistence    Cognition Arousal/Alertness: Awake/alert Behavior During Therapy: WFL for tasks  assessed/performed Overall Cognitive Status: Impaired/Different from baseline Area of Impairment: Problem solving, Attention                   Current Attention Level: Sustained         Problem Solving: Difficulty sequencing,  Requires verbal cues, Requires tactile cues          Exercises Exercises: General Upper Extremity General Exercises - Upper Extremity Shoulder Flexion: PROM, Self ROM, 10 reps, Right, Seated Shoulder ABduction: PROM, Self ROM, Right, 10 reps, Seated Elbow Flexion: PROM, Self ROM, Right, 10 reps, Seated Elbow Extension: PROM, Self ROM, Right, 10 reps, Seated Wrist Flexion: PROM, Self ROM, Right, 10 reps, Seated Wrist Extension: PROM, Self ROM, Right, 10 reps, Seated Digit Composite Flexion: PROM, Self ROM, Right, 10 reps, Seated Composite Extension: PROM, Self ROM, Right, 10 reps, Seated    Shoulder Instructions       General Comments VSS on RA    Pertinent Vitals/ Pain       Pain Assessment Pain Assessment: No/denies pain  Home Living                                          Prior Functioning/Environment              Frequency  Min 2X/week        Progress Toward Goals  OT Goals(current goals can now be found in the care plan section)  Progress towards OT goals: Progressing toward goals  Acute Rehab OT Goals Patient Stated Goal: to get to rehab OT Goal Formulation: With patient Time For Goal Achievement: 05/26/22 Potential to Achieve Goals: Good ADL Goals Pt Will Perform Grooming: with supervision;sitting Pt Will Perform Upper Body Dressing: with supervision;sitting Pt Will Transfer to Toilet: with min assist;squat pivot transfer;bedside commode;regular height toilet Pt/caregiver will Perform Home Exercise Program: Increased strength;Right Upper extremity;With minimal assist  Plan Discharge plan remains appropriate;Frequency remains appropriate    Co-evaluation                 AM-PAC OT "6 Clicks" Daily Activity     Outcome Measure   Help from another person eating meals?: A Little Help from another person taking care of personal grooming?: A Little Help from another person toileting, which includes using toliet, bedpan, or  urinal?: A Lot Help from another person bathing (including washing, rinsing, drying)?: A Lot Help from another person to put on and taking off regular upper body clothing?: A Lot Help from another person to put on and taking off regular lower body clothing?: A Lot 6 Click Score: 14    End of Session Equipment Utilized During Treatment: Gait belt  OT Visit Diagnosis: Unsteadiness on feet (R26.81);Muscle weakness (generalized) (M62.81);Other symptoms and signs involving cognitive function;Hemiplegia and hemiparesis Hemiplegia - Right/Left: Right Hemiplegia - dominant/non-dominant: Dominant Hemiplegia - caused by: Cerebral infarction   Activity Tolerance Patient tolerated treatment well   Patient Left in chair;with call bell/phone within reach;with chair alarm set   Nurse Communication Mobility status        Time: 7510-2585 OT Time Calculation (min): 29 min  Charges: OT General Charges $OT Visit: 1 Visit OT Treatments $Self Care/Home Management : 8-22 mins $Therapeutic Activity: 8-22 mins  Lynnda Child, OTD, OTR/L Acute Rehab [phone] - [place] 05/15/2022, 12:37 PM

## 2022-05-15 NOTE — Progress Notes (Signed)
  Transition of Care [facility]) Screening Note   Patient Details  Name: Angela Buchanan Date of Birth: 1950/12/26   Transition of Care [facility]) CM/SW Contact:    Pollie Friar, RN Phone Number: 05/15/2022, 1:11 PM   Pt is from home with spouse. CIR working up. Transition of Care Department [facility]) has reviewed patient. We will continue to monitor patient advancement through interdisciplinary progression rounds. If new patient transition needs arise, please place a TOC consult.

## 2022-05-16 ENCOUNTER — Encounter (HOSPITAL_COMMUNITY)
Admission: EM | Disposition: A | Discharge status: Skilled Nursing Facility | Payer: Self-pay | Source: Home / Self Care | Attending: Neurology

## 2022-05-16 ENCOUNTER — Inpatient Hospital Stay (HOSPITAL_COMMUNITY): Payer: Medicare PPO

## 2022-05-16 DIAGNOSIS — I639 Cerebral infarction, unspecified: Secondary | ICD-10-CM | POA: Diagnosis not present

## 2022-05-16 HISTORY — PX: LOOP RECORDER INSERTION: EP1214

## 2022-05-16 SURGERY — LOOP RECORDER INSERTION

## 2022-05-16 MED ORDER — LIDOCAINE-EPINEPHRINE 1 %-1:100000 IJ SOLN
INTRAMUSCULAR | Status: DC | PRN
  Administered 2022-05-16: 30 mL

## 2022-05-16 MED ORDER — LIDOCAINE-EPINEPHRINE 1 %-1:100000 IJ SOLN
INTRAMUSCULAR | Status: AC
  Filled 2022-05-16: qty 1

## 2022-05-16 SURGICAL SUPPLY — 2 items
MONITOR CARDIAC LUX-DX INSERT (Prosthesis & Implant Heart) ×1 IMPLANT
PACK LOOP INSERTION (CUSTOM PROCEDURE TRAY) ×2 IMPLANT

## 2022-05-16 NOTE — Progress Notes (Signed)
Modified Barium Swallow Progress Note  Patient Details  Name: Angela Buchanan MRN: [id number] Date of Birth: 08-31-1951  Today's Date: 05/16/2022  Modified Barium Swallow completed.  Full report located under Chart Review in the Imaging Section.  Brief recommendations include the following:  Clinical Impression  Pt demonstrates mild oral deficits with more adequate pharyngeal swallow function. She has reduced labial seal for sucking on a straw, which is improved with placement of the straw on her L side. She had reduced posterior propulsion with the barium tablet, but cleared it from her oral cavity with an additional liquid wash. Her pharyngeal phase was grossly functional and she had good airway protection. Note that the barium tablet appeared to move slowly and then ultimately stop in her distal esophagus (MD not present to confirm). Pt says that her current diet of finely chopped foods has been a "perfect" texture and she wishes to continue it at this time. She denies biting her tongue. Recommend keeping Dys 2 diet and thin liquids. She may find it easier to drink from a cup, but could try a straw with placement on her L side. She was also able to swallow a pill whole with liquid on this study, and could try smaller pills this way, ensured her that she could continue to take them whole in puree if that is her preference.   Swallow Evaluation Recommendations       SLP Diet Recommendations: Dysphagia 2 (Fine chop) solids;Thin liquid   Liquid Administration via: Cup;Straw   Medication Administration: Whole meds with liquid   Supervision: Patient able to self feed;Intermittent supervision to cue for compensatory strategies;Comment (set-up assistance)   Compensations: Slow rate;Small sips/bites   Postural Changes: Seated upright at 90 degrees;Remain semi-upright after after feeds/meals (Comment)   Oral Care Recommendations: Oral care BID        Osie Bond., M.A. [place] Office [phone]  Secure chat preferred  05/16/2022,2:34 PM

## 2022-05-16 NOTE — TOC CAGE-AID Note (Signed)
Transition of Care [facility]) - CAGE-AID Screening   Patient Details  Name: Angela Buchanan MRN: [id number] Date of Birth: 28-Apr-1951  Transition of Care [facility]) CM/SW Contact:    Coralee Pesa, [place] Phone Number: 05/16/2022, 3:04 PM   Clinical Narrative: Pt off unit for procedure and unable to participate in CAGE- AID assessment.   CAGE-AID Screening: Substance Abuse Screening unable to be completed due to: :  (Patient unavailable)

## 2022-05-16 NOTE — Progress Notes (Signed)
Progress Note  Patient Name: Angela Buchanan Date of Encounter: 05/16/2022  [facility] Cardiologist: Skeet Latch, MD   Subjective   Just waking, no f/u question about loop, remains agreeable  Inpatient Medications    Scheduled Meds:  amLODipine  5 mg Oral Daily   anastrozole  1 mg Oral Daily   aspirin EC  81 mg Oral Daily   B-complex with vitamin C  1 tablet Oral Daily   calcium-vitamin D  1 tablet Oral Q breakfast   Chlorhexidine Gluconate Cloth  6 each Topical Daily   cholecalciferol  2,000 Units Oral Daily   citalopram  20 mg Oral Q1200   clopidogrel  75 mg Oral Daily   cyanocobalamin  1,000 mcg Oral Q1200   enoxaparin (LOVENOX) injection  40 mg Subcutaneous Q24H   levothyroxine  25 mcg Oral Q0600   lisinopril  40 mg Oral Daily   pantoprazole  40 mg Oral Q1200   pravastatin  40 mg Oral Daily   Continuous Infusions:  PRN Meds: acetaminophen, acetaminophen, ALPRAZolam, clobetasol ointment, ondansetron (ZOFRAN) IV, mouth rinse, senna-docusate   Vital Signs    Vitals:   05/15/22 2126 05/15/22 2358 05/16/22 0347 05/16/22 0856  BP: 137/88 132/84 127/75 134/79  Pulse: 95 82 92 88  Resp: '19 16 17 20  '$ Temp: 97.6 F (36.4 C) 97.7 F (36.5 C) 98.3 F (36.8 C) 98 F (36.7 C)  TempSrc: Oral Oral Oral Oral  SpO2: 98% 98% 96% 98%  Weight:      Height:        Intake/Output Summary (Last 24 hours) at 05/16/2022 0906 Last data filed at 05/16/2022 0530 Gross per 24 hour  Intake 100 ml  Output 450 ml  Net -350 ml      05/12/2022    4:02 PM 05/12/2022    3:36 PM 01/04/2022   11:10 AM  Last 3 Weights  Weight (lbs) 120 lb 12.8 oz 120 lb 120 lb 8 oz  Weight (kg) 54.795 kg 54.432 kg 54.658 kg      Telemetry    SR, no AFib - Personally Reviewed  ECG    No new EKGs, all priors reviewed, no AFib - Personally Reviewed  Physical Exam   GEN: No acute distress.   Neck: No JVD Cardiac: RRR, no murmurs, rubs, or gallops.  Respiratory: CTA b/l. GI: Soft,  nontender, non-distended  MS: No edema; No deformity. Neuro:  not formally assessed  Psych: Normal affect   Labs    High Sensitivity Troponin:  No results for input(s): "TROPONINIHS" in the last 720 hours.   Chemistry Recent Labs  Lab 05/12/22 1545  NA 135  K 3.7  CL 102  CO2 23  GLUCOSE 94  BUN 10  CREATININE 0.77  CALCIUM 9.6  PROT 7.6  ALBUMIN 4.6  AST 24  ALT 17  ALKPHOS 90  BILITOT 0.5  GFRNONAA >60  ANIONGAP 10    Lipids  Recent Labs  Lab 05/13/22 0137  CHOL 264*  TRIG 56  HDL 86  LDLCALC 167*  CHOLHDL 3.1    Hematology Recent Labs  Lab 05/12/22 1545  WBC 4.2  RBC 3.98  HGB 13.7  HCT 39.8  MCV 100.0  MCH 34.4*  MCHC 34.4  RDW 12.3  PLT 378   Thyroid No results for input(s): "TSH", "FREET4" in the last 168 hours.  BNPNo results for input(s): "BNP", "PROBNP" in the last 168 hours.  DDimer No results for input(s): "DDIMER" in the last  168 hours.   Radiology    No results found.  Cardiac Studies   05/13/22: TTE 1. Left ventricular ejection fraction, by estimation, is 60 to 65%. The  left ventricle has normal function. The left ventricle has no regional  wall motion abnormalities. Left ventricular diastolic parameters were  normal.   2. Right ventricular systolic function is normal. The right ventricular  size is normal. Tricuspid regurgitation signal is inadequate for assessing  PA pressure.   3. The mitral valve is grossly normal. Trivial mitral valve  regurgitation. No evidence of mitral stenosis.   4. The aortic valve is tricuspid. Aortic valve regurgitation is not  visualized. No aortic stenosis is present.   5. The inferior vena cava is normal in size with greater than 50%  respiratory variability, suggesting right atrial pressure of 3 mmHg.   Conclusion(s)/Recommendation(s): No intracardiac source of embolism  detected on this transthoracic study. Consider a transesophageal  echocardiogram to exclude cardiac source of embolism if  clinically  indicated.   Patient Profile     71 y.o. female w/PMHx of Breast ca (treated with lumpectomy and XRT), R subclavian DVT, HTN, HLD, CAD (non-obstructive by CT), celiac, PACs/PVCs (by monitoring done back in 2017) admitted with R sided weakness, difficult speech and ambulation found with cryptogenic stroke  Assessment & Plan    Cryptogenic stroke Planned for loop implant today with Dr. Quentin Ore She remains agreeable  Planned for discharge to SNF/CIR pending final decisions/insurance/bed.   No recovery post loop implant is required She couls discharge afterwards when ready otherwise  For questions or updates, please contact [place] Please consult [email or web address] for contact info under        Signed, Baldwin [place], PA-C  05/16/2022, 9:06 AM

## 2022-05-16 NOTE — Discharge Instructions (Signed)
Post procedure wound care instructions (heart monitor site) Keep incision clean and dry for 3 days. You can remove outer dressing tomorrow. Leave steri-strips (little pieces of tape) on until seen in the office for wound check appointment. Call the office [phone]) for redness, drainage, swelling, or fever.

## 2022-05-16 NOTE — Progress Notes (Signed)
Physical Therapy Treatment Patient Details Name: Angela Buchanan MRN: [id number] DOB: 1951-07-02 Today's Date: 05/16/2022   History of Present Illness Pt is a 71 y.o. female who presented 05/12/22 with acute R-sided weakness and difficulty speaking. Pt administered TNK and transferred to [facility]. 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 PMH: bigeminy, breast cancer, carotid bruit, HTN, PAC, PVC, HLD    PT Comments    Pt progressing well towards all goals. Pt with improved R LE strength but continues to demo R LE impaired co-ordination and ataxia when ambulating. Pt with R sided inattention and impaired sensation requiring max verbal cues to engage R LE in function. Pt with minimal to no active movement in R UE. Pt to strongly benefit from AIR upon d/c however pt reports she is looking to go SNF as pt's husband can't take care of her in this condition so she will need increased time to become supervision level of function. Acute PT to cont to follow.   Recommendations for follow up therapy are one component of a multi-disciplinary discharge planning process, led by the attending physician.  Recommendations may be updated based on patient status, additional functional criteria and insurance authorization.  Follow Up Recommendations  Acute inpatient rehab (3hours/day)     Assistance Recommended at Discharge Frequent or constant Supervision/Assistance  Patient can return home with the following A lot of help with walking and/or transfers;A lot of help with bathing/dressing/bathroom;Assistance with cooking/housework;Direct supervision/assist for medications management;Direct supervision/assist for financial management;Assist for transportation;Help with stairs or ramp for entrance   Equipment Recommendations   (TBD at next venue)    Recommendations for Other Services Rehab consult     Precautions / Restrictions Precautions Precautions:  Fall Restrictions Weight Bearing Restrictions: No Other Position/Activity Restrictions: R UE paresis     Mobility  Bed Mobility Overal bed mobility: Needs Assistance Bed Mobility: Supine to Sit     Supine to sit: HOB elevated, Mod assist     General bed mobility comments: modA for R LE management, minA for trunk elevation, great effort    Transfers Overall transfer level: Needs assistance Equipment used: 1 person hand held assist Transfers: Sit to/from Stand Sit to Stand: Min assist   Step pivot transfers: Mod assist (with max directional verbal cues)       General transfer comment: minA to power up, unable to use R UE to assist, tactile cues at R knee to prevent buckling, directional verbal cues for stepping sequencing to [facility] then to chair to the L and then to the R, modA for R LE advancement    Ambulation/Gait Ambulation/Gait assistance: Mod assist, +2 physical assistance, +2 safety/equipment Gait Distance (Feet): 2 Feet Assistive device: 2 person hand held assist Gait Pattern/deviations: Step-to pattern, Decreased step length - right, Decreased dorsiflexion - right, Decreased stride length, Decreased weight shift to left, Decreased weight shift to right, Knees buckling Gait velocity: dec Gait velocity interpretation: <1.8 ft/sec, indicate of risk for recurrent falls   General Gait Details: Rehab tech provided L HHA and PT on the R side with R UE around PT 3 musketeer style, max directional verbal cues for R LE stepping sequence, modA for R LE placement due to adduction, ataxia and inability to clear R foot, however overall improved from yesterday   Stairs             Wheelchair Mobility    Modified Rankin (Stroke Patients Only) Modified Rankin (Stroke Patients Only) Pre-Morbid Rankin Score: No symptoms Modified  Rankin: Moderately severe disability     Balance Overall balance assessment: Needs assistance Sitting-balance support: No upper extremity  supported, Feet supported Sitting balance-Leahy Scale: Fair     Standing balance support: Bilateral upper extremity supported, During functional activity Standing balance-Leahy Scale: Poor Standing balance comment: Reliant on UE support . Pt stood x 1 min to change mesh underwear with modA, max verbal cues to maintain R quad contraction to prevent buckling and minimize R lateral lean, pt did assist with pericare with L UE                            Cognition Arousal/Alertness: Awake/alert Behavior During Therapy: WFL for tasks assessed/performed Overall Cognitive Status: Within Functional Limits for tasks assessed                                 General Comments: pt able to follow commands correctly, pt with awareness of deficits and being wet for malfunction of purwick, improved ability to vocalize/communicate        Exercises General Exercises - Lower Extremity Ankle Circles/Pumps: AROM, Right, 5 reps, Supine Quad Sets: AROM, Right, 10 reps, Supine Gluteal Sets: AROM, Both, 10 reps, Supine Long Arc Quad: AROM, Strengthening, Both, 10 reps, Seated Hip Flexion/Marching: AROM, Right, 10 reps, Seated    General Comments General comments (skin integrity, edema, etc.): VSS on RA, pt assisted to [facility] to urinate. Pt assisted with hygiene/pericare      Pertinent Vitals/Pain Pain Assessment Pain Assessment: 0-10 Pain Score: 3  Pain Location: R wrist Pain Descriptors / Indicators: Discomfort Pain Intervention(s): Monitored during session    Home Living                          Prior Function            PT Goals (current goals can now be found in the care plan section) Acute Rehab PT Goals Patient Stated Goal: walk again Progress towards PT goals: Progressing toward goals    Frequency    Min 4X/week      PT Plan Current plan remains appropriate    Co-evaluation              AM-PAC PT "6 Clicks" Mobility   Outcome Measure   Help needed turning from your back to your side while in a flat bed without using bedrails?: A Lot Help needed moving from lying on your back to sitting on the side of a flat bed without using bedrails?: A Lot Help needed moving to and from a bed to a chair (including a wheelchair)?: A Lot Help needed standing up from a chair using your arms (e.g., wheelchair or bedside chair)?: A Lot Help needed to walk in hospital room?: A Lot Help needed climbing 3-5 steps with a railing? : Total 6 Click Score: 11    End of Session Equipment Utilized During Treatment: Gait belt Activity Tolerance: Patient tolerated treatment well Patient left: in chair;with call bell/phone within reach;with nursing/sitter in room Nurse Communication: Mobility status PT Visit Diagnosis: Unsteadiness on feet (R26.81);Other abnormalities of gait and mobility (R26.89);Muscle weakness (generalized) (M62.81);Difficulty in walking, not elsewhere classified (R26.2);Other symptoms and signs involving the nervous system (R29.898);Hemiplegia and hemiparesis Hemiplegia - Right/Left: Right Hemiplegia - dominant/non-dominant: Dominant Hemiplegia - caused by: Cerebral infarction     Time: 4888-9169 PT Time Calculation (min) (  ACUTE ONLY): 38 min  Charges:  $Gait Training: 8-22 mins $Therapeutic Activity: 23-37 mins                     Kittie Plater, PT, DPT Acute Rehabilitation Services Secure chat preferred Office #: [phone]    Berline Lopes 05/16/2022, 1:08 PM

## 2022-05-16 NOTE — Progress Notes (Addendum)
STROKE TEAM PROGRESS NOTE   INTERVAL HISTORY  No family is at bedside.  Per patient, speech is unchanged today. Neurologic exam stable. She is awake, alert, sitting up in bed. Patient is able to elevate RLE antigravity with some vertical drift. RLE weakness noted with some improvement on exam today. RUE weak through when gravity eliminated, she has extensor movement of the right arm, no movement distal to the wrist on the right. RLE knee extension > flexion. Neurologic exams and vital signs are stable.   MRI brain showed a large left subcortical infarct measuring 2.1 x 1.5 x 1.2 cm ILR placed today Per SW, disposition plan updated with plans to dc to SNF, possibly tomorrow Complete DAPT with ASA and plavix for 3 weeks then Plavix monotherapy  Vitals:   05/15/22 2126 05/15/22 2358 05/16/22 0347 05/16/22 0856  BP: 137/88 132/84 127/75 134/79  Pulse: 95 82 92 88  Resp: '19 16 17 20  '$ Temp: 97.6 F (36.4 C) 97.7 F (36.5 C) 98.3 F (36.8 C) 98 F (36.7 C)  TempSrc: Oral Oral Oral Oral  SpO2: 98% 98% 96% 98%  Weight:      Height:       CBC:  Recent Labs  Lab 05/12/22 1545  WBC 4.2  NEUTROABS 2.5  HGB 13.7  HCT 39.8  MCV 100.0  PLT 263    Basic Metabolic Panel:  Recent Labs  Lab 05/12/22 1545  NA 135  K 3.7  CL 102  CO2 23  GLUCOSE 94  BUN 10  CREATININE 0.77  CALCIUM 9.6   Lipid Panel:  Recent Labs  Lab 05/13/22 0137  CHOL 264*  TRIG 56  HDL 86  CHOLHDL 3.1  VLDL 11  LDLCALC 167*   HgbA1c:  Recent Labs  Lab 05/13/22 0137  HGBA1C 5.7*   Urine Drug Screen:  Recent Labs  Lab 05/12/22 2236  LABOPIA NONE DETECTED  COCAINSCRNUR NONE DETECTED  LABBENZ NONE DETECTED  AMPHETMU NONE DETECTED  THCU NONE DETECTED  LABBARB NONE DETECTED   Alcohol Level  Recent Labs  Lab 05/12/22 1545  ETH <10   IMAGING past 24 hours No results found.  PHYSICAL EXAM  Temp:  [97.3 F (36.3 C)-98.3 F (36.8 C)] 98 F (36.7 C) (08/15 0856) Pulse Rate:  [82-99] 88  (08/15 0856) Resp:  [16-20] 20 (08/15 0856) BP: (127-144)/(75-88) 134/79 (08/15 0856) SpO2:  [96 %-100 %] 98 % (08/15 0856)  General -frail elderly Caucasian lady in no apparent distress.  Cardiovascular - Regular rhythm and rate.  Mental Status -  Level of arousal and orientation to time, place, and person were intact Speech is dysarthric   Cranial Nerves II - XII - II - Visual field intact OU. III, IV, VI - Extraocular movements intact. V - Facial sensation intact bilaterally. VII - + facial droop on the right VIII - Hearing intact bilaterally. X - Palate elevates symmetrically. XI - Chin turning & shoulder shrug intact bilaterally. XII - Tongue protrusion intact.  Motor Strength - R LUE and LLE 5/5 RUE 2-3/5 proximally and distally.  RLE 3/5 proximally and distally Sensory: Decreased temp sensation to RUE and RLE. Cerebellar: No ataxia on the left. Unable to perform on the right  ASSESSMENT/PLAN Angela Buchanan is an 71 y.o. female with a history of hypertension, HLD, atypical CP, ventricular bigeminy/PAC/PVC, right breast cancer and carotid bruit, who presented to [facility] with acute right sided weakness and difficulty speaking. She stated that she was writing and suddenly was  unable to use her right hand to write and then with difficulty speaking and  right-sided weakness and numbness with difficulty walking which resulted in the decision to be seen in the ED where a Code Stroke was called. NIHSS 4. tNK was administered. Cleviprex was initiated for BP 180/87. CTA showed  No large vessel occlusion, hemodynamically significant stenosis, or evidence of dissection. CTH negative for acute finding. Moderate stenosis left P1-P2 PCA junctio. The patient was then sent to [facility] for post-TNK management and stroke work up.   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 ASPECTS 10.    CTA head & neck 8/1: No large vessel occlusion, hemodynamically significant stenosis, or evidence of dissection Repeat HCT 8/11: No acute intracranial abnormality identified. No significant change MRI brain wo contrast 8/12: Acute/subacute nonhemorrhagic infarct involving the left corona radiata along the lateral margin of the left ventricle 2D Echo EF 60-65% LDL 167 HgbA1c 5.7 VTE prophylaxis - Lovenox Full liquid diet  Recommend Loop recorder prior to CIR On ASA '81mg'$  prior to admission. Now on ASA and Plavix '75mg'$  for 3 weeks then Plavix alone  Therapy recommendations:  SNF Disposition:  TBD, likely 8/16  Hypertension Home meds: toprol, norvasc, lisinopril Unstable requiring cleviprex gtt on arrival. Cleviprex gtt discontinued 8/12 Permissive hypertension (OK if < 220/120) but gradually normalize in 5-7 days Long-term BP goal normotensive  Hyperlipidemia Home meds:  Pravastatin '10mg'$ , which was started, increased to '40mg'$   LDL 167, not at goal < 70 Continue statin at discharge  Dysphagia SLP advises dysphagia 2 diet (fine chop), thin liquids, whole meds with puree with small sips/bites with patient seated at 90 degrees. Discontinued IVF 8/13  Other Stroke Risk Factors Advanced Age >/= 46  Family hx stroke (maternal grandparents and paternal grandparents, brother, sister and mother)  Hospital day # 4 This patient was seen and evaluated with Dr. Leonie Man. He directed the plan of care.  -- Anibal Henderson, AGACNP-BC [facility] [phone]  I have personally obtained history,examined this patient, reviewed notes, independently viewed imaging studies, participated in medical decision making and plan of care.ROS completed by me personally and pertinent positives fully documented  I have made any additions or clarifications  directly to the above note. Agree with note above.  Loop recorder placed today.  Discharge to skilled nursing facility for rehab tomorrow if bed available  Antony Contras, MD Medical Director [facility] Pager: [phone] 05/16/2022 4:38 PM  To contact Stroke Continuity provider, please refer to [email or web address] After hours, contact General Neurology

## 2022-05-16 NOTE — TOC Initial Note (Signed)
Transition of Care [facility]) - Initial/Assessment Note    Patient Details  Name: Angela Buchanan MRN: [id number] Date of Birth: 1950-12-09  [facility] CM/SW Contact:    Geralynn Ochs, LCSW Phone Number: 05/16/2022, 4:21 PM  Clinical Narrative:              CSW met with patient to discuss options for SNF. Patient asking for [facility], as that is the closest to her home, and it's difficult for her husband to travel far. CSW asked [facility] to review, they can offer. CSW submitted for insurance approval, awaiting response. CSW to follow.     Expected Discharge Plan: Skilled Nursing Facility Barriers to Discharge: Continued Medical Work up, Ship broker   Patient Goals and CMS Choice Patient states their goals for this hospitalization and ongoing recovery are:: to get rehab close to home CMS Medicare.gov Compare Post Acute Care list provided to:: Patient Choice offered to / list presented to : Patient  Expected Discharge Plan and Services Expected Discharge Plan: [place] Choice: [place] arrangements for the past 2 months: Single Family Home                                      Prior Living Arrangements/Services Living arrangements for the past 2 months: Single Family Home Lives with:: Spouse Patient language and need for interpreter reviewed:: No        Need for Family Participation in Patient Care: No (Comment) Care giver support system in place?: No (comment)   Criminal Activity/Legal Involvement Pertinent to Current Situation/Hospitalization: No - Comment as needed  Activities of Daily Living Home Assistive Devices/Equipment: None ADL Screening (condition at time of admission) Patient's cognitive ability adequate to safely complete daily activities?: Yes Is the patient deaf or have difficulty hearing?: No Does the patient have difficulty seeing, even when wearing  glasses/contacts?: No Does the patient have difficulty concentrating, remembering, or making decisions?: No Patient able to express need for assistance with ADLs?: Yes Does the patient have difficulty dressing or bathing?: Yes Independently performs ADLs?: No Communication: Needs assistance Is this a change from baseline?: Change from baseline, expected to last >3 days Dressing (OT): Needs assistance Is this a change from baseline?: Change from baseline, expected to last >3 days Grooming: Needs assistance Is this a change from baseline?: Change from baseline, expected to last >3 days Feeding: Needs assistance Is this a change from baseline?: Change from baseline, expected to last >3 days Bathing: Needs assistance Is this a change from baseline?: Change from baseline, expected to last >3 days Toileting: Needs assistance Is this a change from baseline?: Change from baseline, expected to last >3days In/Out Bed: Needs assistance Is this a change from baseline?: Change from baseline, expected to last >3 days Walks in Home: Needs assistance Is this a change from baseline?: Change from baseline, expected to last >3 days Does the patient have difficulty walking or climbing stairs?: Yes Weakness of Legs: Right Weakness of Arms/Hands: Right  Permission Sought/Granted Permission sought to share information with : Facility Art therapist granted to share information with : Yes, Verbal Permission Granted     Permission granted to share info w AGENCY: SNF        Emotional Assessment Appearance:: Appears stated age Attitude/Demeanor/Rapport: Engaged Affect (typically observed): Appropriate Orientation: : Oriented to Self, Oriented to  Place, Oriented to  Time, Oriented to Situation Alcohol / Substance Use: Not Applicable Psych Involvement: No (comment)  Admission diagnosis:  CVA (cerebral vascular accident) ([place]) [I63.9] Cerebrovascular accident (CVA), unspecified  mechanism ([place]) [I63.9] Stroke (cerebrum) ([place]) [I63.9] Patient Active Problem List   Diagnosis Date Noted   Stroke (cerebrum) ([place]) 05/13/2022   CVA (cerebral vascular accident) ([place]) 05/12/2022   Osteoporosis 01/24/2022   Atypical chest pain 07/19/2021   PAC (premature atrial contraction) 05/11/2020   PVC (premature ventricular contraction) 05/11/2020   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   Carotid bruit 11/13/2019   Pure hypercholesterolemia 11/13/2019   Malignant neoplasm of upper-outer quadrant of right breast in female, estrogen receptor positive ([place]) 05/22/2017   Diplopia 09/17/2014   Dizziness and giddiness 09/17/2014   Essential hypertension 07/22/2014   Chest pain 07/22/2014   Family history of heart disease 07/22/2014   PCP:  Glenis Smoker, MD Pharmacy:   [facility], [place] [address] Phone: [phone] Fax: [phone]     Social Determinants of Health (SDOH) Interventions    Readmission Risk Interventions     No data to display

## 2022-05-17 ENCOUNTER — Encounter: Payer: Self-pay | Admitting: Adult Health

## 2022-05-17 ENCOUNTER — Encounter (HOSPITAL_COMMUNITY): Payer: Self-pay | Admitting: Cardiology

## 2022-05-17 ENCOUNTER — Other Ambulatory Visit (HOSPITAL_COMMUNITY): Payer: Self-pay

## 2022-05-17 DIAGNOSIS — Z471 Aftercare following joint replacement surgery: Secondary | ICD-10-CM | POA: Diagnosis not present

## 2022-05-17 DIAGNOSIS — R471 Dysarthria and anarthria: Secondary | ICD-10-CM | POA: Diagnosis not present

## 2022-05-17 DIAGNOSIS — E039 Hypothyroidism, unspecified: Secondary | ICD-10-CM | POA: Diagnosis not present

## 2022-05-17 DIAGNOSIS — R1310 Dysphagia, unspecified: Secondary | ICD-10-CM

## 2022-05-17 DIAGNOSIS — M810 Age-related osteoporosis without current pathological fracture: Secondary | ICD-10-CM | POA: Diagnosis not present

## 2022-05-17 DIAGNOSIS — C50411 Malignant neoplasm of upper-outer quadrant of right female breast: Secondary | ICD-10-CM | POA: Diagnosis not present

## 2022-05-17 DIAGNOSIS — M6281 Muscle weakness (generalized): Secondary | ICD-10-CM | POA: Diagnosis not present

## 2022-05-17 DIAGNOSIS — G8191 Hemiplegia, unspecified affecting right dominant side: Secondary | ICD-10-CM | POA: Diagnosis not present

## 2022-05-17 DIAGNOSIS — K219 Gastro-esophageal reflux disease without esophagitis: Secondary | ICD-10-CM | POA: Diagnosis not present

## 2022-05-17 DIAGNOSIS — Z95818 Presence of other cardiac implants and grafts: Secondary | ICD-10-CM | POA: Diagnosis not present

## 2022-05-17 DIAGNOSIS — R531 Weakness: Secondary | ICD-10-CM | POA: Diagnosis not present

## 2022-05-17 DIAGNOSIS — I639 Cerebral infarction, unspecified: Secondary | ICD-10-CM | POA: Diagnosis not present

## 2022-05-17 DIAGNOSIS — F320 Major depressive disorder, single episode, mild: Secondary | ICD-10-CM | POA: Diagnosis not present

## 2022-05-17 DIAGNOSIS — R52 Pain, unspecified: Secondary | ICD-10-CM | POA: Diagnosis not present

## 2022-05-17 DIAGNOSIS — Z7401 Bed confinement status: Secondary | ICD-10-CM | POA: Diagnosis not present

## 2022-05-17 DIAGNOSIS — R2689 Other abnormalities of gait and mobility: Secondary | ICD-10-CM | POA: Diagnosis not present

## 2022-05-17 DIAGNOSIS — E785 Hyperlipidemia, unspecified: Secondary | ICD-10-CM | POA: Diagnosis not present

## 2022-05-17 DIAGNOSIS — I10 Essential (primary) hypertension: Secondary | ICD-10-CM | POA: Diagnosis not present

## 2022-05-17 DIAGNOSIS — E7800 Pure hypercholesterolemia, unspecified: Secondary | ICD-10-CM | POA: Diagnosis not present

## 2022-05-17 DIAGNOSIS — R1312 Dysphagia, oropharyngeal phase: Secondary | ICD-10-CM | POA: Diagnosis not present

## 2022-05-17 DIAGNOSIS — I69351 Hemiplegia and hemiparesis following cerebral infarction affecting right dominant side: Secondary | ICD-10-CM | POA: Diagnosis not present

## 2022-05-17 MED ORDER — OYSTER SHELL CALCIUM/D3 500-5 MG-MCG PO TABS
1.0000 | ORAL_TABLET | Freq: Every day | ORAL | 0 refills | Status: AC
Start: 2022-05-18 — End: ?
  Filled 2022-05-17: qty 30, 30d supply, fill #0

## 2022-05-17 MED ORDER — LEVOTHYROXINE SODIUM 25 MCG PO TABS
25.0000 ug | ORAL_TABLET | Freq: Every day | ORAL | 0 refills | Status: AC
  Filled 2022-05-17: qty 30, 30d supply, fill #0

## 2022-05-17 MED ORDER — CLOPIDOGREL BISULFATE 75 MG PO TABS
75.0000 mg | ORAL_TABLET | Freq: Every day | ORAL | 0 refills | Status: DC
  Filled 2022-05-17: qty 18, 18d supply, fill #0

## 2022-05-17 MED ORDER — ASPIRIN 81 MG PO TBEC
81.0000 mg | DELAYED_RELEASE_TABLET | Freq: Every day | ORAL | 12 refills | Status: DC
  Filled 2022-05-17: qty 30, 30d supply, fill #0

## 2022-05-17 MED ORDER — CYANOCOBALAMIN 1000 MCG PO TABS
1000.0000 ug | ORAL_TABLET | Freq: Every day | ORAL | 0 refills | Status: DC
  Filled 2022-05-17: qty 30, 30d supply, fill #0

## 2022-05-17 MED ORDER — PANTOPRAZOLE SODIUM 40 MG PO TBEC
40.0000 mg | DELAYED_RELEASE_TABLET | Freq: Every day | ORAL | 0 refills | Status: AC
  Filled 2022-05-17: qty 30, 30d supply, fill #0

## 2022-05-17 MED ORDER — B COMPLEX-C PO TABS
1.0000 | ORAL_TABLET | Freq: Every day | ORAL | 0 refills | Status: DC
  Filled 2022-05-17: qty 30, 30d supply, fill #0

## 2022-05-17 MED ORDER — CLOBETASOL PROPIONATE 0.05 % EX OINT
TOPICAL_OINTMENT | Freq: Two times a day (BID) | CUTANEOUS | 0 refills | Status: AC | PRN
  Filled 2022-05-17: qty 30, 30d supply, fill #0

## 2022-05-17 MED ORDER — PRAVASTATIN SODIUM 40 MG PO TABS
40.0000 mg | ORAL_TABLET | Freq: Every day | ORAL | 0 refills | Status: DC
  Filled 2022-05-17: qty 30, 30d supply, fill #0

## 2022-05-17 NOTE — Progress Notes (Signed)
Occupational Therapy Treatment Patient Details Name: Angela Buchanan MRN: [id number] DOB: 1951-01-31 Today's Date: 05/17/2022   History of present illness Pt is a 71 y.o. female who presented 05/12/22 with acute R-sided weakness and difficulty speaking. Pt administered TNK and transferred to [facility]. 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 PMH: bigeminy, breast cancer, carotid bruit, HTN, PAC, PVC, HLD   OT comments  Pt needing min-mod A for transfers, bed mobility, and ADLs this session. Able to perform standing grooming task with RUE WB on sink. Pt tolerating PROM RUE exercises along with demo'ing good self ROM techniques for RUE. Pt presenting with impairments listed below, will follow acutely. Noted AIR signing off, updating d/c recommendation to SNF.   Recommendations for follow up therapy are one component of a multi-disciplinary discharge planning process, led by the attending physician.  Recommendations may be updated based on patient status, additional functional criteria and insurance authorization.    Follow Up Recommendations  Skilled nursing-short term rehab (<3 hours/day)    Assistance Recommended at Discharge Frequent or constant Supervision/Assistance  Patient can return home with the following  A lot of help with bathing/dressing/bathroom;Two people to help with walking and/or transfers;Help with stairs or ramp for entrance;Assist for transportation;Assistance with cooking/housework   Equipment Recommendations  BSC/3in1;Tub/shower seat;Wheelchair (measurements OT);Wheelchair cushion (measurements OT)    Recommendations for Other Services      Precautions / Restrictions Precautions Precautions: Fall Restrictions Weight Bearing Restrictions: No Other Position/Activity Restrictions: R UE paresis       Mobility Bed Mobility Overal bed mobility: Needs Assistance Bed Mobility: Supine to Sit     Supine to sit: Mod assist     General  bed mobility comments: for scooting hips forward    Transfers Overall transfer level: Needs assistance Equipment used: 1 person hand held assist Transfers: Sit to/from Stand Sit to Stand: Min assist     Step pivot transfers: Mod assist           Balance Overall balance assessment: Needs assistance Sitting-balance support: No upper extremity supported, Feet supported Sitting balance-Leahy Scale: Fair     Standing balance support: Bilateral upper extremity supported, During functional activity Standing balance-Leahy Scale: Poor Standing balance comment: Reliant on UE support . Pt stood x 1 min to change mesh underwear with modA, max verbal cues to maintain R quad contraction to prevent buckling and minimize R lateral lean, pt did assist with pericare with L UE                           ADL either performed or assessed with clinical judgement   ADL Overall ADL's : Needs assistance/impaired     Grooming: Oral care;Wash/dry face;Wash/dry hands;Brushing hair;Standing Grooming Details (indicate cue type and reason): standing grooming task and RUE [employer]                 Toilet Transfer: Moderate assistance;Stand-pivot;BSC/3in1 Armed forces technical officer Details (indicate cue type and reason): simulated to chair         Functional mobility during ADLs: Moderate assistance      Extremity/Trunk Assessment Upper Extremity Assessment Upper Extremity Assessment: RUE deficits/detail RUE Deficits / Details: No AROM noted to elbow distal.  Trace to shoulder flex/ext and ABd/ADd, able to elevate shoulders RUE Sensation: decreased light touch RUE Coordination: decreased fine motor;decreased gross motor   Lower Extremity Assessment Lower Extremity Assessment: Defer to PT evaluation        Vision  Perception Perception Perception: Within Functional Limits   Praxis Praxis Praxis: Not tested Praxis-Other Comments: RLE motor impersistence    Cognition  Arousal/Alertness: Awake/alert Behavior During Therapy: WFL for tasks assessed/performed Overall Cognitive Status: Within Functional Limits for tasks assessed Area of Impairment: Problem solving, Attention                   Current Attention Level: Sustained         Problem Solving: Difficulty sequencing, Requires verbal cues, Requires tactile cues          Exercises General Exercises - Upper Extremity Shoulder Flexion: PROM, Self ROM, 10 reps, Right, Seated Shoulder ABduction: PROM, Self ROM, Right, 10 reps, Seated Elbow Flexion: PROM, Self ROM, Right, 10 reps, Seated Elbow Extension: PROM, Self ROM, Right, 10 reps, Seated Wrist Flexion: PROM, Self ROM, Right, 10 reps, Seated Wrist Extension: PROM, Self ROM, Right, 10 reps, Seated Digit Composite Flexion: PROM, Self ROM, Right, 10 reps, Seated Composite Extension: PROM, Self ROM, Right, 10 reps, Seated    Shoulder Instructions       General Comments VSS on RA, spouse present in room during session    Pertinent Vitals/ Pain       Pain Assessment Pain Assessment: Faces Pain Score: 3  Faces Pain Scale: Hurts a little bit Pain Location: RLE Pain Descriptors / Indicators: Discomfort Pain Intervention(s): Limited activity within patient's tolerance, Monitored during session, Repositioned  Home Living                                          Prior Functioning/Environment              Frequency  Min 2X/week        Progress Toward Goals  OT Goals(current goals can now be found in the care plan section)  Progress towards OT goals: Progressing toward goals  Acute Rehab OT Goals Patient Stated Goal: to get to rehab OT Goal Formulation: With patient Time For Goal Achievement: 05/26/22 Potential to Achieve Goals: Good ADL Goals Pt Will Perform Grooming: with supervision;sitting Pt Will Perform Upper Body Dressing: with supervision;sitting Pt Will Transfer to Toilet: with min  assist;squat pivot transfer;bedside commode;regular height toilet Pt/caregiver will Perform Home Exercise Program: Increased strength;Right Upper extremity;With minimal assist  Plan Frequency remains appropriate;Discharge plan needs to be updated    Co-evaluation                 AM-PAC OT "6 Clicks" Daily Activity     Outcome Measure   Help from another person eating meals?: A Little Help from another person taking care of personal grooming?: A Little Help from another person toileting, which includes using toliet, bedpan, or urinal?: A Lot Help from another person bathing (including washing, rinsing, drying)?: A Lot Help from another person to put on and taking off regular upper body clothing?: A Lot Help from another person to put on and taking off regular lower body clothing?: A Lot 6 Click Score: 14    End of Session Equipment Utilized During Treatment: Gait belt  OT Visit Diagnosis: Unsteadiness on feet (R26.81);Muscle weakness (generalized) (M62.81);Other symptoms and signs involving cognitive function;Hemiplegia and hemiparesis Hemiplegia - Right/Left: Right Hemiplegia - dominant/non-dominant: Dominant Hemiplegia - caused by: Cerebral infarction   Activity Tolerance Patient tolerated treatment well   Patient Left in chair;with call bell/phone within reach;with chair alarm set;with family/visitor present  Nurse Communication Mobility status        Time: 9784-7841 OT Time Calculation (min): 38 min  Charges: OT General Charges $OT Visit: 1 Visit OT Treatments $Self Care/Home Management : 23-37 mins $Therapeutic Activity: 8-22 mins  Lynnda Child, OTD, OTR/L Acute Rehab [phone]) [phone] - [place] 05/17/2022, 11:20 AM

## 2022-05-17 NOTE — TOC CAGE-AID Note (Signed)
Transition of Care (TOC) - CAGE-AID Screening   Patient Details  Name: Angela Buchanan MRN: [id number] Date of Birth: 05/03/1951  Transition of Care (TOC) CM/SW Contact:    Jessica  Smith, LCSWA Phone Number: 05/17/2022, 3:19 PM   Clinical Narrative: CSW met with pt at bedside to complete CAGE AID assessment. Pt denies any substance use and declines resources at this time.   CAGE-AID Screening: Substance Abuse Screening unable to be completed due to: :  (Patient unavailable)  Have You Ever Felt You Ought to Cut Down on Your Drinking or Drug Use?: No Have People Annoyed You By Critizing Your Drinking Or Drug Use?: No Have You Felt Bad Or Guilty About Your Drinking Or Drug Use?: No Have You Ever Had a Drink or Used Drugs First Thing In The Morning to Steady Your Nerves or to Get Rid of a Hangover?: No CAGE-AID Score: 0             

## 2022-05-17 NOTE — Progress Notes (Signed)
Report called to Sam at [facility]. Discharge summary is printed and placed in packed for EMS.

## 2022-05-17 NOTE — Discharge Summary (Addendum)
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 hemiplegia Right facial weakness Hyper lipidemia  Medications to be continued on Rehab Allergies as of 05/17/2022       Reactions   Atenolol Other (See Comments)   CHEST PAIN   Bisoprolol Other (See Comments)   "FEELS WEIRD"   Prednisone Other (See Comments)   UNABLE TO FOCUS   Amoxicillin-pot Clavulanate Diarrhea, Other (See Comments)   Hctz [hydrochlorothiazide]    Hyponatremia   Rosuvastatin    myalgias   Latex Rash        Medication List     STOP taking these medications    ALPRAZolam 0.25 MG tablet Commonly known as: XANAX   amLODipine 5 MG tablet Commonly known as: NORVASC   anastrozole 1 MG tablet Commonly known as: ARIMIDEX   b complex vitamins tablet Replaced by: B-complex with vitamin C tablet   Calcium-Magnesium-Vitamin D 600-300-400 Liqd Replaced by: calcium-vitamin D 500-5 MG-MCG tablet   citalopram 20 MG tablet Commonly known as: CELEXA   diclofenac Sodium 1 % Gel Commonly known as: Voltaren   fluticasone 50 MCG/ACT nasal spray Commonly known as: FLONASE   hydrocortisone 2.5 % cream   ibuprofen 800 MG tablet Commonly known as: ADVIL   levocetirizine 5 MG tablet Commonly known as: XYZAL   lisinopril 40 MG tablet Commonly known as: ZESTRIL   metoprolol succinate 25 MG 24 hr tablet Commonly known as: TOPROL-XL   multivitamin with minerals Tabs tablet   nitroGLYCERIN 0.4 MG SL tablet Commonly known as: NITROSTAT   Prolia 60 MG/ML Sosy  injection Generic drug: denosumab   traMADol 50 MG tablet Commonly known as: ULTRAM   Vitamin B12 1000 MCG Tbcr Replaced by: cyanocobalamin 1000 MCG tablet   Vitamin D 50 MCG (2000 UT) Caps       TAKE these medications    aspirin EC 81 MG tablet Take 1 tablet (81 mg total) by mouth daily. Swallow whole. Start taking on: May 18, 2022 What changed: additional instructions   B-complex with vitamin C tablet Take 1 tablet by mouth daily. Start taking on: May 18, 2022 Replaces: b complex vitamins tablet   calcium-vitamin D 500-5 MG-MCG tablet Commonly known as: OSCAL WITH D Take 1 tablet by mouth daily with breakfast. Start taking on: May 18, 2022 Replaces: Calcium-Magnesium-Vitamin D 600-300-400 Liqd   clobetasol ointment 0.05 % Commonly known as: TEMOVATE Apply topically 2 (two) times daily as needed (itching). What changed: See the new instructions.   clopidogrel 75 MG tablet Commonly known as: PLAVIX Take 1 tablet (75 mg total) by mouth daily. Start taking on: May 18, 2022   cyanocobalamin 1000 MCG tablet Take 1 tablet (1,000 mcg total) by mouth daily at 12 noon. Replaces: Vitamin B12 1000 MCG Tbcr   levothyroxine 25 MCG tablet Commonly known as: SYNTHROID Take 1 tablet (25 mcg total) by mouth daily at 6 (six) AM. Start taking on: May 18, 2022 What changed: when to take this   pantoprazole 40 MG tablet Commonly known as: PROTONIX Take 1 tablet (  40 mg total) by mouth daily at 12 noon.   pravastatin 40 MG tablet Commonly known as: PRAVACHOL Take 1 tablet (40 mg total) by mouth daily. Start taking on: May 18, 2022 What changed:  medication strength how much to take        LABORATORY STUDIES CBC    Component Value Date/Time   WBC 4.2 05/12/2022 1545   RBC 3.98 05/12/2022 1545   HGB 13.7 05/12/2022 1545   HGB 13.7 11/24/2019 1437   HGB 12.5 05/23/2017 1209   HCT 39.8 05/12/2022 1545   HCT 38.7 11/24/2019 1437   HCT 36.6  05/23/2017 1209   PLT 378 05/12/2022 1545   PLT 421 11/24/2019 1437   MCV 100.0 05/12/2022 1545   MCV 97 11/24/2019 1437   MCV 97.5 05/23/2017 1209   MCH 34.4 (H) 05/12/2022 1545   MCHC 34.4 05/12/2022 1545   RDW 12.3 05/12/2022 1545   RDW 11.8 11/24/2019 1437   RDW 12.9 05/23/2017 1209   LYMPHSABS 0.9 05/12/2022 1545   LYMPHSABS 1.0 11/24/2019 1437   LYMPHSABS 1.7 05/23/2017 1209   MONOABS 0.4 05/12/2022 1545   MONOABS 0.5 05/23/2017 1209   EOSABS 0.3 05/12/2022 1545   EOSABS 0.2 11/24/2019 1437   BASOSABS 0.1 05/12/2022 1545   BASOSABS 0.1 11/24/2019 1437   BASOSABS 0.1 05/23/2017 1209   CMP    Component Value Date/Time   NA 135 05/12/2022 1545   NA 138 08/01/2021 1357   NA 138 05/23/2017 1209   K 3.7 05/12/2022 1545   K 3.8 05/23/2017 1209   CL 102 05/12/2022 1545   CO2 23 05/12/2022 1545   CO2 25 05/23/2017 1209   GLUCOSE 94 05/12/2022 1545   GLUCOSE 101 05/23/2017 1209   BUN 10 05/12/2022 1545   BUN 12 08/01/2021 1357   BUN 11.9 05/23/2017 1209   CREATININE 0.77 05/12/2022 1545   CREATININE 0.81 05/02/2018 1539   CREATININE 0.8 05/23/2017 1209   CALCIUM 9.6 05/12/2022 1545   CALCIUM 9.5 05/23/2017 1209   PROT 7.6 05/12/2022 1545   PROT 7.0 01/14/2020 1535   PROT 7.2 05/23/2017 1209   ALBUMIN 4.6 05/12/2022 1545   ALBUMIN 4.7 01/14/2020 1535   ALBUMIN 3.8 05/23/2017 1209   AST 24 05/12/2022 1545   AST 24 05/02/2018 1539   AST 20 05/23/2017 1209   ALT 17 05/12/2022 1545   ALT 12 05/02/2018 1539   ALT 12 05/23/2017 1209   ALKPHOS 90 05/12/2022 1545   ALKPHOS 99 05/23/2017 1209   BILITOT 0.5 05/12/2022 1545   BILITOT 0.4 01/14/2020 1535   BILITOT 0.4 05/02/2018 1539   BILITOT 0.50 05/23/2017 1209   GFRNONAA >60 05/12/2022 1545   GFRNONAA >60 05/02/2018 1539   GFRAA 86 02/24/2020 1442   GFRAA >60 05/02/2018 1539   COAGS Lab Results  Component Value Date   INR 1.0 05/12/2022   Lipid Panel    Component Value Date/Time   CHOL 264 (H) 05/13/2022  0137   CHOL 174 01/14/2020 1535   TRIG 56 05/13/2022 0137   HDL 86 05/13/2022 0137   HDL 75 01/14/2020 1535   CHOLHDL 3.1 05/13/2022 0137   VLDL 11 05/13/2022 0137   LDLCALC 167 (H) 05/13/2022 0137   LDLCALC 72 01/14/2020 1535   HgbA1C  Lab Results  Component Value Date   HGBA1C 5.7 (H) 05/13/2022   Urinalysis    Component Value Date/Time   COLORURINE STRAW (A) 05/12/2022 2236   APPEARANCEUR CLEAR 05/12/2022 2236   LABSPEC  1.020 05/12/2022 2236   PHURINE 7.0 05/12/2022 2236   GLUCOSEU NEGATIVE 05/12/2022 2236   HGBUR NEGATIVE 05/12/2022 2236   BILIRUBINUR NEGATIVE 05/12/2022 2236   KETONESUR 5 (A) 05/12/2022 2236   PROTEINUR NEGATIVE 05/12/2022 2236   UROBILINOGEN 0.2 10/20/2011 2023   NITRITE NEGATIVE 05/12/2022 2236   LEUKOCYTESUR NEGATIVE 05/12/2022 2236   Urine Drug Screen     Component Value Date/Time   LABOPIA NONE DETECTED 05/12/2022 2236   COCAINSCRNUR NONE DETECTED 05/12/2022 2236   LABBENZ NONE DETECTED 05/12/2022 2236   AMPHETMU NONE DETECTED 05/12/2022 2236   THCU NONE DETECTED 05/12/2022 2236   LABBARB NONE DETECTED 05/12/2022 2236    Alcohol Level    Component Value Date/Time   ETH <10 05/12/2022 1545     SIGNIFICANT DIAGNOSTIC STUDIES EP PPM/ICD IMPLANT  Result Date: 05/16/2022 CONCLUSIONS:  1. Successful implantation of a implantable loop recorder for Cryptogenic stroke  2. No early apparent complications.   DG Swallowing Func-Speech Pathology  Result Date: 05/16/2022 Table formatting from the original result was not included. Objective Swallowing Evaluation: Type of Study: MBS-Modified Barium Swallow Study  Patient Details Name: JADEA SHIFFER MRN: [id number] Date of Birth: 1950-12-04 Today's Date: 05/16/2022 Time: SLP Start Time (ACUTE ONLY): 1055 -SLP Stop Time (ACUTE ONLY): 1123 SLP Time Calculation (min) (ACUTE ONLY): 28 min Past Medical History: Past Medical History: Diagnosis Date  Atypical chest pain 07/19/2021  Bigeminy   no current med.   Breast cancer ([place]) 06/2017  right  Bruises easily   Carotid bruit 11/13/2019  Dental crowns present   History of diverticulitis   Hypertension   states under control with med., has been on med. x 5 yr.  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 Past Surgical History: Past Surgical History: Procedure Laterality Date  ABDOMINAL HYSTERECTOMY    partial  BREAST LUMPECTOMY Right 06/22/2017  Malignant  BREAST LUMPECTOMY WITH RADIOACTIVE SEED AND SENTINEL LYMPH NODE BIOPSY Right 06/22/2017  Procedure: RIGHT BREAST LUMPECTOMY WITH RADIOACTIVE SEED AND RIGHT AXILLARY DEEP SENTINEL LYMPH NODE BIOPSY WITH BLUE DYE INJECTION;  Surgeon: Fanny Skates, MD;  Location: [facility];  Service: General;  Laterality: Right;  BREAST LUMPECTOMY WITH RADIOACTIVE SEED LOCALIZATION Left 06/22/2017  Procedure: LEFT BREAST LUMPECTOMY WITH RADIOACTIVE SEED LOCALIZATION;  Surgeon: Fanny Skates, MD;  Location: [facility];  Service: General;  Laterality: Left;  CATARACT EXTRACTION W/ INTRAOCULAR LENS  IMPLANT, BILATERAL Bilateral   EXCISION OF BREAST BIOPSY Left 06/22/2017  benign  TONSILLECTOMY    age 82 HPI: Patient is a 71 y.o. female with PMH: HLD, atipal CP, right breast cancer and carotid bruit who presented to [facility] on 05/12/22 with acute right sided weakness and difficulty speaking. In ED patient's NIHSS was 4; she received TNK and sent to [facility] for post-TNK management and stroke work-up. CT head negative for acute intracranial abnormality but MRI brain has not yet been completed. Patient was made NPO awaiting SLP swallow evaluation.  Subjective: pleasant, cooperative, very happy to be on Dys 2 diet and said it is "perfect"  Recommendations for follow up therapy are one  component of a multi-disciplinary discharge planning process, led by the attending physician.  Recommendations may be updated based on patient status, additional functional criteria and insurance authorization. Assessment / Plan / Recommendation  05/16/2022   2:00 PM Clinical Impressions Clinical Impression Pt demonstrates mild oral deficits with more adequate pharyngeal swallow function. She has reduced labial seal for sucking on a straw, which is improved with placement of the straw on her L side. She had reduced posterior propulsion with the barium tablet, but cleared it from her oral cavity with an additional liquid wash. Her pharyngeal phase was grossly functional and she had good airway protection. Note that the barium tablet appeared to move slowly and then ultimately stop in her distal esophagus (MD not present to confirm). Pt says that her current diet of finely chopped foods has been a "perfect" texture and she wishes to continue it at this time. She denies biting her tongue. Recommend keeping Dys 2 diet and thin liquids. She may find it easier to drink from a cup, but could try a straw with placement on her L side. She was also able to swallow a pill whole with liquid on this study, and could try smaller pills this way, ensured her that she could continue to take them whole in puree if that is her preference. SLP Visit Diagnosis Dysphagia, oral phase (R13.11) Impact on safety and function Mild aspiration risk     05/16/2022   2:00 PM Treatment Recommendations Treatment Recommendations Therapy as outlined in treatment plan below     05/16/2022   2:00 PM Prognosis Prognosis for Safe Diet Advancement Good   05/16/2022   2:00 PM Diet Recommendations SLP Diet Recommendations Dysphagia 2 (Fine chop) solids;Thin liquid Liquid Administration via Cup;Straw Medication Administration Whole meds with liquid Compensations Slow rate;Small sips/bites Postural Changes Seated upright at 90 degrees;Remain semi-upright after  after feeds/meals (Comment)     05/16/2022   2:00 PM Other Recommendations Oral Care Recommendations Oral care BID Follow Up Recommendations Acute inpatient rehab (3hours/day) Assistance recommended at discharge Frequent or constant Supervision/Assistance Functional Status Assessment Patient has had a recent decline in their functional status and demonstrates the ability to make significant improvements in function in a reasonable and predictable amount of time.   05/16/2022   2:00 PM Frequency and Duration  Speech Therapy Frequency (ACUTE ONLY) min 2x/week Treatment Duration 2 weeks     05/16/2022   2:00 PM Oral Phase Oral Phase Impaired Oral - Thin Cup [facility] Oral - Thin Straw Other (Comment) Oral - Puree WFL Oral - Regular WFL Oral - Pill Lingual/palatal residue    05/16/2022   2:00 PM Pharyngeal Phase Pharyngeal Phase [facility]    05/16/2022   2:00 PM Cervical Esophageal Phase  Cervical Esophageal Phase [facility] Osie Bond., M.A. [place] Office [phone] Secure chat preferred 05/16/2022, 2:35 PM                     MR BRAIN WO CONTRAST  Result Date: 05/13/2022 CLINICAL DATA:  Right upper and lower extremity weakness. Abnormal speech. EXAM: MRI HEAD WITHOUT CONTRAST TECHNIQUE: Multiplanar, multiecho pulse sequences of the brain and surrounding structures were obtained without intravenous contrast. COMPARISON:  CT head without contrast and CTA head and neck 05/12/2022 FINDINGS: Brain: Acute/subacute nonhemorrhagic infarct present in the left corona radiata along the lateral margin of the left ventricle. The infarcted territory measures 21 x 15 x 12 mm. Moderate atrophy and confluent periventricular T2 hyperintensity is present separate from the acute infarct. The ventricles are proportionate to the degree of atrophy. No significant extraaxial fluid collection is present. The internal auditory canals are within normal limits. The brainstem and cerebellum are  within normal limits. Vascular: Flow is  present in the major intracranial arteries. Skull and upper cervical spine: The craniocervical junction is normal. Upper cervical spine is within normal limits. Marrow signal is unremarkable. Sinuses/Orbits: The paranasal sinuses and mastoid air cells are clear. Bilateral lens replacements are noted. Globes and orbits are otherwise unremarkable. IMPRESSION: 1. Acute/subacute nonhemorrhagic infarct involving the left corona radiata along the lateral margin of the left ventricle. 2. Moderate atrophy and confluent periventricular white matter disease otherwise likely reflects the sequela of chronic microvascular ischemia. The above was relayed via text pager to Dr. Rory Percy On 05/13/2022 at 19:47 . Electronically Signed   By: San Morelle M.D.   On: 05/13/2022 19:47   ECHOCARDIOGRAM COMPLETE  Result Date: 05/13/2022    ECHOCARDIOGRAM REPORT   Patient Name:   ARABELLA REVELLE Date of Exam: 05/13/2022 Medical Rec #:  [id number]     Height:       62.0 in Accession #:    [id number]    Weight:       120.8 lb Date of Birth:  January 18, 1951     BSA:          1.543 m Patient Age:    71 years      BP:           133/75 mmHg Patient Gender: F             HR:           69 bpm. Exam Location:  Inpatient Procedure: 2D Echo, Cardiac Doppler and Color Doppler Indications:    Stroke  History:        Patient has no prior history of Echocardiogram examinations.  Sonographer:    Merrie Roof RDCS Referring Phys: [place]  1. Left ventricular ejection fraction, by estimation, is 60 to 65%. The left ventricle has normal function. The left ventricle has no regional wall motion abnormalities. Left ventricular diastolic parameters were normal.  2. Right ventricular systolic function is normal. The right ventricular size is normal. Tricuspid regurgitation signal is inadequate for assessing PA pressure.  3. The mitral valve is grossly normal. Trivial mitral valve regurgitation. No evidence of mitral stenosis.  4. The aortic  valve is tricuspid. Aortic valve regurgitation is not visualized. No aortic stenosis is present.  5. The inferior vena cava is normal in size with greater than 50% respiratory variability, suggesting right atrial pressure of 3 mmHg. Conclusion(s)/Recommendation(s): No intracardiac source of embolism detected on this transthoracic study. Consider a transesophageal echocardiogram to exclude cardiac source of embolism if clinically indicated. FINDINGS  Left Ventricle: Left ventricular ejection fraction, by estimation, is 60 to 65%. The left ventricle has normal function. The left ventricle has no regional wall motion abnormalities. The left ventricular internal cavity size was normal in size. There is  no left ventricular hypertrophy. Left ventricular diastolic parameters were normal. Right Ventricle: The right ventricular size is normal. No increase in right ventricular wall thickness. Right ventricular systolic function is normal. Tricuspid regurgitation signal is inadequate for assessing PA pressure. Left Atrium: Left atrial size was normal in size. Right Atrium: Right atrial size was normal in size. Pericardium: There is no evidence of pericardial effusion. Mitral Valve: The mitral valve is grossly normal. Trivial mitral valve regurgitation. No evidence of mitral valve stenosis. Tricuspid Valve: The tricuspid valve is grossly normal. Tricuspid valve regurgitation is not demonstrated. No evidence of tricuspid stenosis. Aortic Valve: The aortic valve is tricuspid. Aortic valve regurgitation is not  visualized. No aortic stenosis is present. Pulmonic Valve: The pulmonic valve was grossly normal. Pulmonic valve regurgitation is not visualized. No evidence of pulmonic stenosis. Aorta: The aortic root and ascending aorta are structurally normal, with no evidence of dilitation. Venous: The inferior vena cava is normal in size with greater than 50% respiratory variability, suggesting right atrial pressure of 3 mmHg.  IAS/Shunts: The atrial septum is grossly normal.  LEFT VENTRICLE PLAX 2D LVIDd:         3.60 cm   Diastology LVIDs:         2.60 cm   LV e' medial:    10.30 cm/s LV PW:         0.80 cm   LV E/e' medial:  6.4 LV IVS:        0.60 cm   LV e' lateral:   8.05 cm/s LVOT diam:     1.80 cm   LV E/e' lateral: 8.2 LV SV:         56 LV SV Index:   36 LVOT Area:     2.54 cm  RIGHT VENTRICLE RV Basal diam:  2.60 cm LEFT ATRIUM             Index        RIGHT ATRIUM           Index LA diam:        2.60 cm 1.69 cm/m   RA Area:     12.60 cm LA Vol (A2C):   50.2 ml 32.53 ml/m  RA Volume:   27.80 ml  18.02 ml/m LA Vol (A4C):   33.5 ml 21.71 ml/m LA Biplane Vol: 40.9 ml 26.51 ml/m  AORTIC VALVE LVOT Vmax:   108.00 cm/s LVOT Vmean:  67.200 cm/s LVOT VTI:    0.221 m  AORTA Ao Root diam: 2.90 cm Ao Asc diam:  2.80 cm MITRAL VALVE MV Area (PHT): 3.91 cm    SHUNTS MV Decel Time: 194 msec    Systemic VTI:  0.22 m MV E velocity: 65.70 cm/s  Systemic Diam: 1.80 cm MV A velocity: 98.50 cm/s MV E/A ratio:  0.67 Eleonore Chiquito MD Electronically signed by Eleonore Chiquito MD Signature Date/Time: 05/13/2022/6:21:45 PM    Final    CT HEAD WO CONTRAST (5MM)  Result Date: 05/12/2022 CLINICAL DATA:  Follow-up examination for neuro deficit, stroke suspected. EXAM: CT HEAD WITHOUT CONTRAST TECHNIQUE: Contiguous axial images were obtained from the base of the skull through the vertex without intravenous contrast. RADIATION DOSE REDUCTION: This exam was performed according to the departmental dose-optimization program which includes automated exposure control, adjustment of the mA and/or kV according to patient size and/or use of iterative reconstruction technique. COMPARISON:  Multiple previous CTs from earlier the same day. FINDINGS: Brain: Stable atrophy with chronic small vessel ischemic disease. No acute intracranial hemorrhage. No visible acute or large vessel territory infarct. No mass lesion, mass effect or midline shift. No hydrocephalus or  extra-axial fluid collection. Vascular: Residual contrast material seen within the intracranial circulation. Scattered vascular calcifications noted within the carotid siphons. Skull: Scalp soft tissues and calvarium demonstrate no new finding. Sinuses/Orbits: Globes orbital soft tissues within normal limits. Paranasal sinuses and mastoid air cells are clear. Other: None. IMPRESSION: 1. Stable head CT. No visible acute or evolving ischemia or other acute intracranial abnormality. 2. Stable atrophy with chronic small vessel ischemic disease. Electronically Signed   By: Jeannine Boga M.D.   On: 05/12/2022 19:35   CT Head Wo  Contrast  Result Date: 05/12/2022 CLINICAL DATA:  Worsening weakness on the left. Slurred speech. Neuro deficit. EXAM: CT HEAD WITHOUT CONTRAST TECHNIQUE: Contiguous axial images were obtained from the base of the skull through the vertex without intravenous contrast. RADIATION DOSE REDUCTION: This exam was performed according to the departmental dose-optimization program which includes automated exposure control, adjustment of the mA and/or kV according to patient size and/or use of iterative reconstruction technique. COMPARISON:  Noncontrast CT of the brain May 12, 2022. CT angiogram of the brain May 12, 2022. FINDINGS: Brain: Significant contrast remains in the vessels and dural sinuses. White matter changes are stable and moderate. No acute cortical ischemia or infarct. Ventricles and sulci are stable. Cerebellum, brainstem, and basal cisterns are stable. No evidence of subdural, epidural, or subarachnoid hemorrhage. Vascular: Calcified atherosclerotic changes are identified in the intracranial carotids. Skull: Normal. Negative for fracture or focal lesion. Sinuses/Orbits: No acute finding. Other: None. IMPRESSION: No acute intracranial abnormality identified. No significant change since the noncontrast CT scan earlier today. Electronically Signed   By: Dorise Bullion III M.D.    On: 05/12/2022 17:44   CT ANGIO HEAD NECK W WO CM (CODE STROKE)  Result Date: 05/12/2022 CLINICAL DATA:  Neuro deficit, acute, stroke suspected EXAM: CT ANGIOGRAPHY HEAD AND NECK TECHNIQUE: Multidetector CT imaging of the head and neck was performed using the standard protocol during bolus administration of intravenous contrast. Multiplanar CT image reconstructions and MIPs were obtained to evaluate the vascular anatomy. Carotid stenosis measurements (when applicable) are obtained utilizing NASCET criteria, using the distal internal carotid diameter as the denominator. RADIATION DOSE REDUCTION: This exam was performed according to the departmental dose-optimization program which includes automated exposure control, adjustment of the mA and/or kV according to patient size and/or use of iterative reconstruction technique. CONTRAST:  57m OMNIPAQUE IOHEXOL 350 MG/ML SOLN COMPARISON:  None Available. FINDINGS: CTA NECK Aortic arch: Unremarkable.  Great vessel origins are patent. Right carotid system: Patent.  No stenosis. Left carotid system: Patent.  No stenosis. Vertebral arteries: Patent and codominant. No stenosis or evidence of dissection. Skeleton: Mild cervical spine degenerative changes. Other neck: Right thyroid nodule previously evaluated by ultrasound. Upper chest: No apical lung mass. Review of the MIP images confirms the above findings CTA HEAD Anterior circulation: Intracranial internal carotid arteries are patent with minimal calcified plaque. Anterior cerebral arteries are patent. Middle cerebral arteries are patent. Posterior circulation: Intracranial vertebral arteries patent. Basilar artery is patent. Major cerebellar artery origins are patent. Left posterior communicating artery is present. Posterior cerebral arteries are patent with fetal origin on. Moderate stenosis at the left P1-P2 junction. Venous sinuses: Patent as allowed by contrast bolus timing. Review of the MIP images confirms the  above findings IMPRESSION: No large vessel occlusion, hemodynamically significant stenosis, or evidence of dissection. Moderate stenosis left P1-P2 PCA junction. Electronically Signed   By: PMacy MisM.D.   On: 05/12/2022 16:39   CT HEAD CODE STROKE WO CONTRAST  Result Date: 05/12/2022 CLINICAL DATA:  Code stroke. Neuro deficit, acute, stroke suspected. Right arm numbness and leg weakness with speech disturbance. EXAM: CT HEAD WITHOUT CONTRAST TECHNIQUE: Contiguous axial images were obtained from the base of the skull through the vertex without intravenous contrast. RADIATION DOSE REDUCTION: This exam was performed according to the departmental dose-optimization program which includes automated exposure control, adjustment of the mA and/or kV according to patient size and/or use of iterative reconstruction technique. COMPARISON:  None Available. FINDINGS: Brain: There is no evidence of an  acute infarct, intracranial hemorrhage, mass, midline shift, or extra-axial fluid collection. Mild prominence of the lateral ventricles is favored to reflect central predominant cerebral atrophy. Patchy hypodensities in the cerebral white matter bilaterally are nonspecific but compatible with moderate chronic small vessel ischemic disease. Vascular: Calcified atherosclerosis at the skull base. No hyperdense vessel. Skull: No fracture or suspicious osseous lesion. Sinuses/Orbits: Visualized paranasal sinuses and mastoid air cells are clear. Visualized orbits are unremarkable. Other: None. ASPECTS [facility] Stroke Program Early CT Score) - Ganglionic level infarction (caudate, lentiform nuclei, internal capsule, insula, M1-M3 cortex): 7 - Supraganglionic infarction (M4-M6 cortex): 3 Total score (0-10 with 10 being normal): 10 IMPRESSION: 1. No evidence of acute intracranial abnormality. ASPECTS of 10. 2. Moderate chronic small vessel ischemic disease. These results were called by telephone at the time of interpretation on  05/12/2022 at 4:01 pm to Dr. Fredia Sorrow, who verbally acknowledged these results. Electronically Signed   By: Logan Bores M.D.   On: 05/12/2022 16:03       HISTORY OF PRESENT ILLNESS  PHILOMENE HAFF is an 71 y.o. female with a history of hypertension, HLD, atypical CP, ventricular bigeminy/PAC/PVC, right breast cancer and carotid bruit, who presented to [facility] with acute right sided weakness and difficulty speaking. She stated that she was writing and suddenly was unable to use her right hand to write and then with difficulty speaking and  right-sided weakness and numbness with difficulty walking which resulted in the decision to be seen in the ED where a Code Stroke was called. NIHSS 4. tNK was administered. Cleviprex was initiated for BP 180/87. CTA showed  No large vessel occlusion, hemodynamically significant stenosis, or evidence of dissection. CTH negative for acute finding. Moderate stenosis left P1-P2 PCA junctio. The patient was then sent to [facility] for post-TNK management and stroke work up.     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 ASPECTS 10.    CTA head & neck 8/1: No large vessel occlusion, hemodynamically significant stenosis, or evidence of dissection Repeat HCT 8/11: No acute intracranial abnormality identified. No significant change MRI brain wo contrast 8/12: Acute/subacute nonhemorrhagic infarct involving the left corona radiata along the lateral margin of the left ventricle 2D Echo EF 60-65% LDL 167 HgbA1c 5.7 Implantable loop recorder has been placed on 8/15 for continued monitoring She has been placed on ASA '81mg'$  daily and Plavix 75 mg daily for 3 weeks then Plavix alone for monotherapy  She  was hypertensive on admission and required Cleviprex drip for BP management and blood pressure has been stable, home medications have not been started, consider adding back as needed.  She was on Pravastatin '10mg'$  prior to admission, LDL-c 167, increased to '40mg'$  daily for a goal of <70 LDL-c.  Dysphagia has been a problem, she is on a dysphagia 2 diet diet (fine chop), thin liquids, whole meds with puree with small sips/bites with patient seated at 90 degrees. Speech therapy continuing to follow patient PT/OT evaluated patient and recommended SNF placement  DISCHARGE EXAM Blood pressure 130/78, pulse 89, temperature 98 F (36.7 C), temperature source Oral, resp. rate 16, height '5\' 2"'$  (1.575 m), weight 54.8 kg, SpO2 98 %.  General -frail elderly Caucasian lady in no apparent distress.   Cardiovascular -  Regular rhythm and rate.   Mental Status -  Level of arousal and orientation to time, place, and person were intact Speech is dysarthric   Neurologic exam stable. She is awake, alert, sitting up in bed. Patient is able to elevate RLE antigravity with some vertical drift. RLE weakness noted with some improvement on exam today. RUE weak through when gravity eliminated, she has extensor movement of the right arm, no movement distal to the wrist on the right. RLE knee extension > flexion. Neurologic exams and vital signs are stable.   Discharge Diet      Diet   DIET DYS 2 Room service appropriate? Yes with Assist; Fluid consistency: Thin   liquids  DISCHARGE PLAN Disposition:  Transfer to SNF aspirin 81 mg daily and clopidogrel 75 mg daily for secondary stroke prevention for 3 weeks then Plavix alone. Recommend ongoing stroke risk factor control by Primary Care Physician at time of discharge. Follow-up PCP Glenis Smoker, MD in 2 weeks following discharge from rehab. Follow-up in [facility] in 8 weeks following discharge, office to schedule an  appointment.   50 minutes were spent preparing discharge.  Beulah Gandy DNP, ACNPC-AG  I have personally obtained history,examined this patient, reviewed notes, independently viewed imaging studies, participated in medical decision making and plan of care.ROS completed by me personally and pertinent positives fully documented  I have made any additions or clarifications directly to the above note. Agree with note above.    Antony Contras, MD Medical Director [facility] Pager: [phone] 05/17/2022 1:59 PM

## 2022-05-17 NOTE — TOC Transition Note (Signed)
Transition of Care [facility]) - CM/SW Discharge Note   Patient Details  Name: MILAYNA ROTENBERG MRN: [id number] Date of Birth: 1951-09-21  Transition of Care [facility]) CM/SW Contact:  Geralynn Ochs, LCSW Phone Number: 05/17/2022, 2:09 PM   Clinical Narrative:   CSW received insurance authorization and confirmed there is a bed at [facility] for today. CSW updated MD, sent discharge summary to [facility]. CSW updated patient, spoke with spouse on phone who are both in agreement to transition to SNF today. Transport scheduled with [facility] for next available.  Nurse to call report to [phone], Room 603A    Final next level of care: Skilled Nursing Facility Barriers to Discharge: Barriers Resolved   Patient Goals and CMS Choice Patient states their goals for this hospitalization and ongoing recovery are:: to get rehab close to home CMS Medicare.gov Compare Post Acute Care list provided to:: Patient Choice offered to / list presented to : Patient  Discharge Placement              Patient chooses bed at: [facility] Patient to be transferred to facility by: [place] Name of family member notified: Self, spouse Patient and family notified of of transfer: 05/17/22  Discharge Plan and Services     Post Acute Care Choice: [place]                               Social Determinants of Health (SDOH) Interventions     Readmission Risk Interventions     No data to display

## 2022-05-17 NOTE — Progress Notes (Signed)
Physical Therapy Treatment Patient Details Name: Angela Buchanan MRN: [id number] DOB: 12-Nov-1950 Today's Date: 05/17/2022   History of Present Illness Pt is a 71 y.o. female who presented 05/12/22 with acute R-sided weakness and difficulty speaking. Pt administered TNK and transferred to [facility]. 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 PMH: bigeminy, breast cancer, carotid bruit, HTN, PAC, PVC, HLD    PT Comments    Pt continues to give great effort, reports doing her HEP, and remains positive and motivated to return to indep. Pt with improved R quad strength however has increased difficulty with R hip flexor and clearing R foot during amb. Emphasis on standing posture, R quad facilitation and maintaining contraction in standing for optimal standing posture and to improve ambulation kinematics. Pt remains to have dense R hemiparesis in UE. Continue to recommend AIR Upon d/c but aware pt has chosen SNF. Acute PT to cont to follow.    Recommendations for follow up therapy are one component of a multi-disciplinary discharge planning process, led by the attending physician.  Recommendations may be updated based on patient status, additional functional criteria and insurance authorization.  Follow Up Recommendations  Acute inpatient rehab (3hours/day)     Assistance Recommended at Discharge Frequent or constant Supervision/Assistance  Patient can return home with the following A lot of help with walking and/or transfers;A lot of help with bathing/dressing/bathroom;Assistance with cooking/housework;Direct supervision/assist for medications management;Direct supervision/assist for financial management;Assist for transportation;Help with stairs or ramp for entrance   Equipment Recommendations   (TBD at next venue)    Recommendations for Other Services Rehab consult     Precautions / Restrictions Precautions Precautions: Fall Precaution Comments: dense R  hemiparesis Restrictions Weight Bearing Restrictions: No Other Position/Activity Restrictions: R UE paresis     Mobility  Bed Mobility               General bed mobility comments: pt sitting up in chair upon PT arrival    Transfers Overall transfer level: Needs assistance Equipment used: 1 person hand held assist Transfers: Sit to/from Stand Sit to Stand: Min assist           General transfer comment: minA to power up, worked on sit to stands with emphasis on using R UE and LE maximally, no L UE assist, minimal L LE assist. Completed 10 sit to stand with modA to power up, maxA to maintain R hand on R arm rest    Ambulation/Gait Ambulation/Gait assistance: Mod assist, +2 physical assistance, +2 safety/equipment   Assistive device: 2 person hand held assist Gait Pattern/deviations: Step-to pattern, Decreased step length - right, Decreased dorsiflexion - right, Decreased stride length, Decreased weight shift to left, Decreased weight shift to right, Knees buckling Gait velocity: dec     General Gait Details: Rehab tech provided L HHA and PT on the R side with R UE around PT 3 musketeer style, max directional verbal cues for R LE stepping sequence, modA for R LE placement due to adduction, ataxia and inability to clear R foot, however overall improved from yesterday   Stairs             Wheelchair Mobility    Modified Rankin (Stroke Patients Only) Modified Rankin (Stroke Patients Only) Pre-Morbid Rankin Score: No symptoms Modified Rankin: Moderately severe disability     Balance Overall balance assessment: Needs assistance Sitting-balance support: No upper extremity supported, Feet supported Sitting balance-Leahy Scale: Fair Sitting balance - Comments: pt able to sit EOB today with  close supervision   Standing balance support: Bilateral upper extremity supported, During functional activity Standing balance-Leahy Scale: Poor Standing balance comment:  Reliant on UE support . Pt stood x 1 min to change mesh underwear with modA, max verbal cues to maintain R quad contraction to prevent buckling and minimize R lateral lean, pt did assist with pericare with L UE                            Cognition Arousal/Alertness: Awake/alert Behavior During Therapy: WFL for tasks assessed/performed Overall Cognitive Status: Within Functional Limits for tasks assessed Area of Impairment: Problem solving, Attention                   Current Attention Level: Sustained         Problem Solving: Difficulty sequencing, Requires verbal cues, Requires tactile cues General Comments: pt able to follow commands correctly, continues to be very motivated        Exercises General Exercises - Lower Extremity Hip Flexion/Marching: Right, 10 reps, Seated, AAROM Other Exercises Other Exercises: worked in standing with lateral side stepping x 20 reps and then forward  steps with L LE x 20 reps to focus on maintaining R knee straight/R quad engagement to improve gait kinematics    General Comments General comments (skin integrity, edema, etc.): VSS on RA      Pertinent Vitals/Pain Pain Assessment Pain Assessment: Faces Faces Pain Scale: Hurts a little bit Pain Location: RLE Pain Descriptors / Indicators: Discomfort Pain Intervention(s): Monitored during session    Home Living                          Prior Function            PT Goals (current goals can now be found in the care plan section) Acute Rehab PT Goals Patient Stated Goal: walk again PT Goal Formulation: With patient Time For Goal Achievement: 05/27/22 Potential to Achieve Goals: Good Progress towards PT goals: Progressing toward goals    Frequency    Min 4X/week      PT Plan Current plan remains appropriate    Co-evaluation              AM-PAC PT "6 Clicks" Mobility   Outcome Measure  Help needed turning from your back to your side while  in a flat bed without using bedrails?: A Lot Help needed moving from lying on your back to sitting on the side of a flat bed without using bedrails?: A Lot Help needed moving to and from a bed to a chair (including a wheelchair)?: A Lot Help needed standing up from a chair using your arms (e.g., wheelchair or bedside chair)?: A Lot Help needed to walk in hospital room?: A Lot Help needed climbing 3-5 steps with a railing? : Total 6 Click Score: 11    End of Session Equipment Utilized During Treatment: Gait belt Activity Tolerance: Patient tolerated treatment well Patient left: in chair;with call bell/phone within reach;with nursing/sitter in room Nurse Communication: Mobility status PT Visit Diagnosis: Unsteadiness on feet (R26.81);Other abnormalities of gait and mobility (R26.89);Muscle weakness (generalized) (M62.81);Difficulty in walking, not elsewhere classified (R26.2);Other symptoms and signs involving the nervous system (R29.898);Hemiplegia and hemiparesis Hemiplegia - Right/Left: Right Hemiplegia - dominant/non-dominant: Dominant Hemiplegia - caused by: Cerebral infarction     Time: 1205-1234 PT Time Calculation (min) (ACUTE ONLY): 29 min  Charges:  $Gait  Training: 8-22 mins $Neuromuscular Re-education: 8-22 mins                     Kittie Plater, PT, DPT Acute Rehabilitation Services Secure chat preferred Office #: [phone]    Berline Lopes 05/17/2022, 12:50 PM

## 2022-05-18 DIAGNOSIS — E039 Hypothyroidism, unspecified: Secondary | ICD-10-CM | POA: Diagnosis not present

## 2022-05-18 DIAGNOSIS — E785 Hyperlipidemia, unspecified: Secondary | ICD-10-CM | POA: Diagnosis not present

## 2022-05-18 DIAGNOSIS — G8191 Hemiplegia, unspecified affecting right dominant side: Secondary | ICD-10-CM | POA: Diagnosis not present

## 2022-05-18 DIAGNOSIS — I10 Essential (primary) hypertension: Secondary | ICD-10-CM | POA: Diagnosis not present

## 2022-05-19 DIAGNOSIS — I10 Essential (primary) hypertension: Secondary | ICD-10-CM | POA: Diagnosis not present

## 2022-05-19 DIAGNOSIS — K219 Gastro-esophageal reflux disease without esophagitis: Secondary | ICD-10-CM | POA: Diagnosis not present

## 2022-05-19 DIAGNOSIS — I639 Cerebral infarction, unspecified: Secondary | ICD-10-CM | POA: Diagnosis not present

## 2022-05-19 DIAGNOSIS — C50411 Malignant neoplasm of upper-outer quadrant of right female breast: Secondary | ICD-10-CM | POA: Diagnosis not present

## 2022-05-23 ENCOUNTER — Encounter: Payer: Self-pay | Admitting: Adult Health

## 2022-05-31 DIAGNOSIS — I69351 Hemiplegia and hemiparesis following cerebral infarction affecting right dominant side: Secondary | ICD-10-CM | POA: Diagnosis not present

## 2022-05-31 DIAGNOSIS — R52 Pain, unspecified: Secondary | ICD-10-CM | POA: Diagnosis not present

## 2022-05-31 DIAGNOSIS — Z95818 Presence of other cardiac implants and grafts: Secondary | ICD-10-CM | POA: Diagnosis not present

## 2022-05-31 DIAGNOSIS — F320 Major depressive disorder, single episode, mild: Secondary | ICD-10-CM | POA: Diagnosis not present

## 2022-06-08 DIAGNOSIS — R2689 Other abnormalities of gait and mobility: Secondary | ICD-10-CM | POA: Diagnosis not present

## 2022-06-08 DIAGNOSIS — I69351 Hemiplegia and hemiparesis following cerebral infarction affecting right dominant side: Secondary | ICD-10-CM | POA: Diagnosis not present

## 2022-06-08 DIAGNOSIS — M6281 Muscle weakness (generalized): Secondary | ICD-10-CM | POA: Diagnosis not present

## 2022-06-14 ENCOUNTER — Telehealth: Payer: Self-pay | Admitting: Adult Health

## 2022-06-14 NOTE — Telephone Encounter (Signed)
Per 9/13 phone line pt called to cancel appointment  will call back to rs will can

## 2022-06-15 ENCOUNTER — Ambulatory Visit: Payer: Medicare PPO | Admitting: Adult Health

## 2022-06-19 ENCOUNTER — Ambulatory Visit (INDEPENDENT_AMBULATORY_CARE_PROVIDER_SITE_OTHER): Payer: Medicare PPO

## 2022-06-19 DIAGNOSIS — I25118 Atherosclerotic heart disease of native coronary artery with other forms of angina pectoris: Secondary | ICD-10-CM | POA: Diagnosis not present

## 2022-06-20 ENCOUNTER — Ambulatory Visit (HOSPITAL_BASED_OUTPATIENT_CLINIC_OR_DEPARTMENT_OTHER): Payer: Medicare PPO | Admitting: Cardiovascular Disease

## 2022-06-20 LAB — CUP PACEART REMOTE DEVICE CHECK
Date Time Interrogation Session: 20230918132309
Implantable Pulse Generator Implant Date: 20230815
Pulse Gen Serial Number: 183424

## 2022-06-21 ENCOUNTER — Other Ambulatory Visit: Payer: Self-pay

## 2022-06-21 ENCOUNTER — Encounter (HOSPITAL_COMMUNITY): Payer: Self-pay

## 2022-06-21 ENCOUNTER — Inpatient Hospital Stay (HOSPITAL_COMMUNITY)
Admission: EM | Admit: 2022-06-21 | Discharge: 2022-06-26 | DRG: 481 | Disposition: A | Discharge status: Skilled Nursing Facility | Payer: Medicare PPO | Attending: Internal Medicine | Admitting: Internal Medicine

## 2022-06-21 ENCOUNTER — Emergency Department (HOSPITAL_COMMUNITY): Payer: Medicare PPO

## 2022-06-21 DIAGNOSIS — S72031A Displaced midcervical fracture of right femur, initial encounter for closed fracture: Principal | ICD-10-CM | POA: Diagnosis present

## 2022-06-21 DIAGNOSIS — M25511 Pain in right shoulder: Secondary | ICD-10-CM | POA: Diagnosis not present

## 2022-06-21 DIAGNOSIS — R471 Dysarthria and anarthria: Secondary | ICD-10-CM | POA: Diagnosis not present

## 2022-06-21 DIAGNOSIS — E039 Hypothyroidism, unspecified: Secondary | ICD-10-CM | POA: Diagnosis present

## 2022-06-21 DIAGNOSIS — Z888 Allergy status to other drugs, medicaments and biological substances status: Secondary | ICD-10-CM

## 2022-06-21 DIAGNOSIS — Z853 Personal history of malignant neoplasm of breast: Secondary | ICD-10-CM | POA: Diagnosis not present

## 2022-06-21 DIAGNOSIS — Z79899 Other long term (current) drug therapy: Secondary | ICD-10-CM | POA: Diagnosis not present

## 2022-06-21 DIAGNOSIS — R531 Weakness: Secondary | ICD-10-CM | POA: Diagnosis not present

## 2022-06-21 DIAGNOSIS — I69351 Hemiplegia and hemiparesis following cerebral infarction affecting right dominant side: Secondary | ICD-10-CM | POA: Diagnosis not present

## 2022-06-21 DIAGNOSIS — M6281 Muscle weakness (generalized): Secondary | ICD-10-CM | POA: Diagnosis not present

## 2022-06-21 DIAGNOSIS — S79911A Unspecified injury of right hip, initial encounter: Secondary | ICD-10-CM | POA: Diagnosis not present

## 2022-06-21 DIAGNOSIS — I639 Cerebral infarction, unspecified: Secondary | ICD-10-CM | POA: Diagnosis present

## 2022-06-21 DIAGNOSIS — I10 Essential (primary) hypertension: Secondary | ICD-10-CM | POA: Diagnosis present

## 2022-06-21 DIAGNOSIS — R079 Chest pain, unspecified: Secondary | ICD-10-CM | POA: Diagnosis not present

## 2022-06-21 DIAGNOSIS — Y9301 Activity, walking, marching and hiking: Secondary | ICD-10-CM | POA: Diagnosis present

## 2022-06-21 DIAGNOSIS — K5900 Constipation, unspecified: Secondary | ICD-10-CM | POA: Diagnosis not present

## 2022-06-21 DIAGNOSIS — Z79890 Hormone replacement therapy (postmenopausal): Secondary | ICD-10-CM | POA: Diagnosis not present

## 2022-06-21 DIAGNOSIS — E876 Hypokalemia: Secondary | ICD-10-CM | POA: Diagnosis present

## 2022-06-21 DIAGNOSIS — Y92019 Unspecified place in single-family (private) house as the place of occurrence of the external cause: Secondary | ICD-10-CM

## 2022-06-21 DIAGNOSIS — E440 Moderate protein-calorie malnutrition: Secondary | ICD-10-CM | POA: Diagnosis not present

## 2022-06-21 DIAGNOSIS — E7800 Pure hypercholesterolemia, unspecified: Secondary | ICD-10-CM | POA: Diagnosis not present

## 2022-06-21 DIAGNOSIS — Z681 Body mass index (BMI) 19 or less, adult: Secondary | ICD-10-CM

## 2022-06-21 DIAGNOSIS — Z91040 Latex allergy status: Secondary | ICD-10-CM

## 2022-06-21 DIAGNOSIS — S72009A Fracture of unspecified part of neck of unspecified femur, initial encounter for closed fracture: Secondary | ICD-10-CM | POA: Diagnosis present

## 2022-06-21 DIAGNOSIS — Z923 Personal history of irradiation: Secondary | ICD-10-CM | POA: Diagnosis not present

## 2022-06-21 DIAGNOSIS — Z8673 Personal history of transient ischemic attack (TIA), and cerebral infarction without residual deficits: Secondary | ICD-10-CM | POA: Diagnosis not present

## 2022-06-21 DIAGNOSIS — R350 Frequency of micturition: Secondary | ICD-10-CM | POA: Diagnosis present

## 2022-06-21 DIAGNOSIS — Z7982 Long term (current) use of aspirin: Secondary | ICD-10-CM

## 2022-06-21 DIAGNOSIS — Z90710 Acquired absence of both cervix and uterus: Secondary | ICD-10-CM | POA: Diagnosis not present

## 2022-06-21 DIAGNOSIS — D62 Acute posthemorrhagic anemia: Secondary | ICD-10-CM | POA: Diagnosis not present

## 2022-06-21 DIAGNOSIS — R2689 Other abnormalities of gait and mobility: Secondary | ICD-10-CM | POA: Diagnosis not present

## 2022-06-21 DIAGNOSIS — Z8249 Family history of ischemic heart disease and other diseases of the circulatory system: Secondary | ICD-10-CM | POA: Diagnosis not present

## 2022-06-21 DIAGNOSIS — Z471 Aftercare following joint replacement surgery: Secondary | ICD-10-CM | POA: Diagnosis not present

## 2022-06-21 DIAGNOSIS — R011 Cardiac murmur, unspecified: Secondary | ICD-10-CM | POA: Insufficient documentation

## 2022-06-21 DIAGNOSIS — R1312 Dysphagia, oropharyngeal phase: Secondary | ICD-10-CM | POA: Diagnosis not present

## 2022-06-21 DIAGNOSIS — W010XXA Fall on same level from slipping, tripping and stumbling without subsequent striking against object, initial encounter: Secondary | ICD-10-CM | POA: Diagnosis present

## 2022-06-21 DIAGNOSIS — Z7401 Bed confinement status: Secondary | ICD-10-CM | POA: Diagnosis not present

## 2022-06-21 DIAGNOSIS — M898X9 Other specified disorders of bone, unspecified site: Secondary | ICD-10-CM | POA: Diagnosis present

## 2022-06-21 DIAGNOSIS — Z7902 Long term (current) use of antithrombotics/antiplatelets: Secondary | ICD-10-CM

## 2022-06-21 DIAGNOSIS — S72001A Fracture of unspecified part of neck of right femur, initial encounter for closed fracture: Principal | ICD-10-CM

## 2022-06-21 DIAGNOSIS — M7989 Other specified soft tissue disorders: Secondary | ICD-10-CM | POA: Diagnosis not present

## 2022-06-21 DIAGNOSIS — Z880 Allergy status to penicillin: Secondary | ICD-10-CM | POA: Diagnosis not present

## 2022-06-21 DIAGNOSIS — I4891 Unspecified atrial fibrillation: Secondary | ICD-10-CM | POA: Diagnosis not present

## 2022-06-21 DIAGNOSIS — D638 Anemia in other chronic diseases classified elsewhere: Secondary | ICD-10-CM | POA: Diagnosis not present

## 2022-06-21 DIAGNOSIS — S72001D Fracture of unspecified part of neck of right femur, subsequent encounter for closed fracture with routine healing: Secondary | ICD-10-CM | POA: Diagnosis not present

## 2022-06-21 DIAGNOSIS — Z823 Family history of stroke: Secondary | ICD-10-CM | POA: Diagnosis not present

## 2022-06-21 DIAGNOSIS — W19XXXA Unspecified fall, initial encounter: Secondary | ICD-10-CM | POA: Diagnosis not present

## 2022-06-21 DIAGNOSIS — M810 Age-related osteoporosis without current pathological fracture: Secondary | ICD-10-CM | POA: Diagnosis not present

## 2022-06-21 LAB — CBC WITH DIFFERENTIAL/PLATELET
Abs Immature Granulocytes: 0.03 10*3/uL (ref 0.00–0.07)
Basophils Absolute: 0.1 10*3/uL (ref 0.0–0.1)
Basophils Relative: 1 %
Eosinophils Absolute: 0.1 10*3/uL (ref 0.0–0.5)
Eosinophils Relative: 2 %
HCT: 35.8 % — ABNORMAL LOW (ref 36.0–46.0)
Hemoglobin: 12.2 g/dL (ref 12.0–15.0)
Immature Granulocytes: 1 %
Lymphocytes Relative: 10 %
Lymphs Abs: 0.6 10*3/uL — ABNORMAL LOW (ref 0.7–4.0)
MCH: 34.6 pg — ABNORMAL HIGH (ref 26.0–34.0)
MCHC: 34.1 g/dL (ref 30.0–36.0)
MCV: 101.4 fL — ABNORMAL HIGH (ref 80.0–100.0)
Monocytes Absolute: 0.4 10*3/uL (ref 0.1–1.0)
Monocytes Relative: 8 %
Neutro Abs: 4.3 10*3/uL (ref 1.7–7.7)
Neutrophils Relative %: 78 %
Platelets: 435 10*3/uL — ABNORMAL HIGH (ref 150–400)
RBC: 3.53 MIL/uL — ABNORMAL LOW (ref 3.87–5.11)
RDW: 13 % (ref 11.5–15.5)
WBC: 5.5 10*3/uL (ref 4.0–10.5)
nRBC: 0 % (ref 0.0–0.2)

## 2022-06-21 LAB — SURGICAL PCR SCREEN
MRSA, PCR: NEGATIVE
Staphylococcus aureus: NEGATIVE

## 2022-06-21 LAB — URINALYSIS, ROUTINE W REFLEX MICROSCOPIC
Bacteria, UA: NONE SEEN
Bilirubin Urine: NEGATIVE
Glucose, UA: NEGATIVE mg/dL
Ketones, ur: 20 mg/dL — AB
Nitrite: NEGATIVE
Protein, ur: NEGATIVE mg/dL
Specific Gravity, Urine: 1.01 (ref 1.005–1.030)
pH: 7 (ref 5.0–8.0)

## 2022-06-21 LAB — BASIC METABOLIC PANEL
Anion gap: 9 (ref 5–15)
BUN: 12 mg/dL (ref 8–23)
CO2: 22 mmol/L (ref 22–32)
Calcium: 9.2 mg/dL (ref 8.9–10.3)
Chloride: 110 mmol/L (ref 98–111)
Creatinine, Ser: 0.76 mg/dL (ref 0.44–1.00)
GFR, Estimated: >60 mL/min (ref >60)
Glucose, Bld: 106 mg/dL — ABNORMAL HIGH (ref 70–99)
Potassium: 2.4 mmol/L — CL (ref 3.5–5.1)
Sodium: 141 mmol/L (ref 135–145)

## 2022-06-21 LAB — MAGNESIUM: Magnesium: 2 mg/dL (ref 1.7–2.4)

## 2022-06-21 LAB — POTASSIUM: Potassium: 2.9 mmol/L — ABNORMAL LOW (ref 3.5–5.1)

## 2022-06-21 MED ORDER — HYDROMORPHONE HCL 1 MG/ML IJ SOLN
0.5000 mg | INTRAMUSCULAR | Status: DC | PRN
  Administered 2022-06-21 – 2022-06-22 (×3): 0.5 mg via INTRAVENOUS
  Filled 2022-06-21 (×4): qty 0.5

## 2022-06-21 MED ORDER — POTASSIUM CHLORIDE 10 MEQ/100ML IV SOLN
10.0000 meq | INTRAVENOUS | Status: DC
  Administered 2022-06-21 (×4): 10 meq via INTRAVENOUS
  Filled 2022-06-21 (×4): qty 100

## 2022-06-21 MED ORDER — ONDANSETRON HCL 4 MG PO TABS: 4.0000 mg | ORAL_TABLET | Freq: Four times a day (QID) | ORAL | Status: DC | PRN

## 2022-06-21 MED ORDER — ONDANSETRON HCL 4 MG/2ML IJ SOLN
4.0000 mg | Freq: Once | INTRAMUSCULAR | Status: AC
Start: 2022-06-21 — End: 2022-06-21
  Administered 2022-06-21: 4 mg via INTRAVENOUS
  Filled 2022-06-21: qty 2

## 2022-06-21 MED ORDER — POTASSIUM CHLORIDE CRYS ER 20 MEQ PO TBCR
40.0000 meq | EXTENDED_RELEASE_TABLET | ORAL | Status: AC
  Administered 2022-06-21 (×2): 40 meq via ORAL
  Filled 2022-06-21 (×2): qty 2

## 2022-06-21 MED ORDER — ONDANSETRON HCL 4 MG/2ML IJ SOLN
4.0000 mg | Freq: Once | INTRAMUSCULAR | Status: AC
  Administered 2022-06-21: 4 mg via INTRAVENOUS
  Filled 2022-06-21: qty 2

## 2022-06-21 MED ORDER — ACETAMINOPHEN 500 MG PO TABS
1000.0000 mg | ORAL_TABLET | Freq: Once | ORAL | Status: AC
  Administered 2022-06-22: 1000 mg via ORAL
  Filled 2022-06-21: qty 2

## 2022-06-21 MED ORDER — POTASSIUM CHLORIDE CRYS ER 20 MEQ PO TBCR: 40.0000 meq | EXTENDED_RELEASE_TABLET | Freq: Once | ORAL | Status: DC

## 2022-06-21 MED ORDER — POTASSIUM CHLORIDE CRYS ER 20 MEQ PO TBCR: 20.0000 meq | EXTENDED_RELEASE_TABLET | Freq: Four times a day (QID) | ORAL | Status: DC

## 2022-06-21 MED ORDER — POVIDONE-IODINE 10 % EX SWAB: 2.0000 | Freq: Once | CUTANEOUS | Status: DC

## 2022-06-21 MED ORDER — OXYCODONE HCL 5 MG PO TABS
5.0000 mg | ORAL_TABLET | ORAL | Status: DC | PRN
  Administered 2022-06-22: 5 mg via ORAL
  Filled 2022-06-21: qty 1

## 2022-06-21 MED ORDER — KETOROLAC TROMETHAMINE 15 MG/ML IJ SOLN
15.0000 mg | Freq: Once | INTRAMUSCULAR | Status: AC
  Administered 2022-06-21: 15 mg via INTRAVENOUS
  Filled 2022-06-21: qty 1

## 2022-06-21 MED ORDER — METOPROLOL TARTRATE 25 MG PO TABS
25.0000 mg | ORAL_TABLET | Freq: Every evening | ORAL | Status: DC
  Administered 2022-06-21 – 2022-06-25 (×5): 25 mg via ORAL
  Filled 2022-06-21 (×5): qty 1

## 2022-06-21 MED ORDER — ONDANSETRON HCL 4 MG/2ML IJ SOLN: 4.0000 mg | Freq: Four times a day (QID) | INTRAMUSCULAR | Status: DC | PRN

## 2022-06-21 MED ORDER — PANTOPRAZOLE SODIUM 40 MG PO TBEC
40.0000 mg | DELAYED_RELEASE_TABLET | Freq: Every day | ORAL | Status: DC
  Administered 2022-06-22 – 2022-06-25 (×4): 40 mg via ORAL
  Filled 2022-06-21 (×4): qty 1

## 2022-06-21 MED ORDER — LEVOTHYROXINE SODIUM 25 MCG PO TABS
25.0000 ug | ORAL_TABLET | Freq: Every day | ORAL | Status: DC
  Administered 2022-06-22 – 2022-06-26 (×5): 25 ug via ORAL
  Filled 2022-06-21 (×5): qty 1

## 2022-06-21 MED ORDER — ACETAMINOPHEN 650 MG RE SUPP: 650.0000 mg | Freq: Four times a day (QID) | RECTAL | Status: DC | PRN

## 2022-06-21 MED ORDER — POTASSIUM CHLORIDE 10 MEQ/100ML IV SOLN
10.0000 meq | INTRAVENOUS | Status: AC
  Filled 2022-06-21: qty 100

## 2022-06-21 MED ORDER — MORPHINE SULFATE (PF) 4 MG/ML IV SOLN
4.0000 mg | Freq: Once | INTRAVENOUS | Status: AC
  Administered 2022-06-21: 4 mg via INTRAVENOUS
  Filled 2022-06-21: qty 1

## 2022-06-21 MED ORDER — LISINOPRIL 20 MG PO TABS
40.0000 mg | ORAL_TABLET | Freq: Every day | ORAL | Status: DC
  Administered 2022-06-21 – 2022-06-26 (×4): 40 mg via ORAL
  Filled 2022-06-21 (×4): qty 2

## 2022-06-21 MED ORDER — PRAVASTATIN SODIUM 20 MG PO TABS
40.0000 mg | ORAL_TABLET | Freq: Every day | ORAL | Status: DC
  Administered 2022-06-21 – 2022-06-26 (×5): 40 mg via ORAL
  Filled 2022-06-21 (×5): qty 2

## 2022-06-21 MED ORDER — CEFAZOLIN SODIUM-DEXTROSE 2-4 GM/100ML-% IV SOLN: 2.0000 g | INTRAVENOUS | Status: DC

## 2022-06-21 MED ORDER — AMLODIPINE BESYLATE 5 MG PO TABS
5.0000 mg | ORAL_TABLET | Freq: Every day | ORAL | Status: DC
  Administered 2022-06-21 – 2022-06-26 (×4): 5 mg via ORAL
  Filled 2022-06-21 (×4): qty 1

## 2022-06-21 MED ORDER — CHLORHEXIDINE GLUCONATE 4 % EX LIQD
60.0000 mL | Freq: Once | CUTANEOUS | Status: AC
  Administered 2022-06-21: 4 via TOPICAL

## 2022-06-21 MED ORDER — ACETAMINOPHEN 325 MG PO TABS
650.0000 mg | ORAL_TABLET | Freq: Four times a day (QID) | ORAL | Status: DC | PRN
  Administered 2022-06-22: 650 mg via ORAL
  Filled 2022-06-21: qty 2

## 2022-06-21 MED ORDER — SODIUM CHLORIDE 0.45 % IV SOLN: INTRAVENOUS | Status: DC

## 2022-06-21 MED ORDER — MAGNESIUM SULFATE 2 GM/50ML IV SOLN
2.0000 g | Freq: Once | INTRAVENOUS | Status: AC
Start: 2022-06-21 — End: 2022-06-21
  Administered 2022-06-21: 2 g via INTRAVENOUS
  Filled 2022-06-21: qty 50

## 2022-06-21 MED ORDER — HYDROXYZINE HCL 25 MG PO TABS
25.0000 mg | ORAL_TABLET | Freq: Three times a day (TID) | ORAL | Status: DC | PRN
  Administered 2022-06-22 – 2022-06-26 (×2): 25 mg via ORAL
  Filled 2022-06-21 (×2): qty 1

## 2022-06-21 NOTE — Progress Notes (Signed)
Orthopedics consulted for patient's right femoral neck fracture. This requires operative fixation. Patient's surgery was delayed this afternoon due to hypokalemia and now OR availability. Will plan for operative fixation tomorrow. Diet orders have been modified. Keep NPO at midnight. Formal consultation to follow in the AM.   Noemi Chapel, PA-C 06/21/2022

## 2022-06-21 NOTE — ED Provider Notes (Signed)
[facility] DEPT Provider Note   CSN: [id number] Arrival date & time: 06/21/22  1204     History  Chief Complaint  Patient presents with   Fall   Hip Pain    Angela Buchanan is a 71 y.o. female.  71 yo F with a chief complaints of a fall.  The patient was recently admitted for a stroke and she was walking with her walker and she lost her balance and fell onto her right side.  Complaining of pain mostly to the right pelvis.  Also has some pain to the right upper arm.  She denies head injury denies loss of consciousness denies chest pain denies difficulty breathing denies abdominal pain or back pain.  She was unable to get up and walk after the incident and called EMS.   Fall  Hip Pain       Home Medications Prior to Admission medications   Medication Sig Start Date End Date Taking? Authorizing Provider  aspirin EC 81 MG tablet Take 1 tablet (81 mg total) by mouth daily. Swallow whole. 05/18/22   August Albino, NP  B Complex-C (B-COMPLEX WITH VITAMIN C) tablet Take 1 tablet by mouth daily. 05/18/22   August Albino, NP  calcium-vitamin D Darron Doom WITH D) 500-5 MG-MCG tablet Take 1 tablet by mouth daily with breakfast. 05/18/22   August Albino, NP  clobetasol ointment (TEMOVATE) 0.05 % Apply topically 2 (two) times daily as needed (itching). 05/17/22   August Albino, NP  clopidogrel (PLAVIX) 75 MG tablet Take 1 tablet (75 mg total) by mouth daily. 05/18/22   August Albino, NP  cyanocobalamin 1000 MCG tablet Take 1 tablet (1,000 mcg total) by mouth daily at 12 noon. 05/17/22   August Albino, NP  levothyroxine (SYNTHROID) 25 MCG tablet Take 1 tablet (25 mcg total) by mouth daily at 6 (six) AM. 05/18/22   August Albino, NP  pantoprazole (PROTONIX) 40 MG tablet Take 1 tablet (40 mg total) by mouth daily at 12 noon. 05/17/22   August Albino, NP  pravastatin (PRAVACHOL) 40 MG tablet Take 1 tablet (40 mg total) by mouth daily. 05/18/22   August Albino,  NP      Allergies    Atenolol, Bisoprolol, Prednisone, Amoxicillin-pot clavulanate, Hctz [hydrochlorothiazide], Rosuvastatin, and Latex    Review of Systems   Review of Systems  Physical Exam Updated Vital Signs BP (!) 143/84   Pulse 65   Temp 98 F (36.7 C)   Resp 18   LMP  (LMP Unknown)   SpO2 98%  Physical Exam Vitals and nursing note reviewed.  Constitutional:      General: She is not in acute distress.    Appearance: She is well-developed. She is not diaphoretic.  HENT:     Head: Normocephalic and atraumatic.  Eyes:     Pupils: Pupils are equal, round, and reactive to light.  Cardiovascular:     Rate and Rhythm: Normal rate and regular rhythm.     Heart sounds: No murmur heard.    No friction rub. No gallop.  Pulmonary:     Effort: Pulmonary effort is normal.     Breath sounds: No wheezing or rales.  Abdominal:     General: There is no distension.     Palpations: Abdomen is soft.     Tenderness: There is no abdominal tenderness.  Musculoskeletal:        General: Tenderness present.  Cervical back: Normal range of motion and neck supple.     Comments: Pain mostly along the pelvis.  Able to range the right hip without obvious tenderness.  Pulse motor and sensation intact distally.  Some pain at the proximal humerus.  No obvious pain at the elbow.  No midline tenderness step-offs or deformities.  Skin:    General: Skin is warm and dry.  Neurological:     Mental Status: She is alert and oriented to person, place, and time.  Psychiatric:        Behavior: Behavior normal.     ED Results / Procedures / Treatments   Labs (all labs ordered are listed, but only abnormal results are displayed) Labs Reviewed  CBC WITH DIFFERENTIAL/PLATELET - Abnormal; Notable for the following components:      Result Value   RBC 3.53 (*)    HCT 35.8 (*)    MCV 101.4 (*)    MCH 34.6 (*)    Platelets 435 (*)    Lymphs Abs 0.6 (*)    All other components within normal limits   BASIC METABOLIC PANEL - Abnormal; Notable for the following components:   Potassium 2.4 (*)    Glucose, Bld 106 (*)    All other components within normal limits  MAGNESIUM    EKG None  Radiology DG Chest 1 View  Result Date: 06/21/2022 CLINICAL DATA:  Acute right hip fracture.  Pain. EXAM: CHEST  1 VIEW COMPARISON:  AP chest 05/19/2020 FINDINGS: A likely cardiac loop recorder overlies the left hemithorax, new from prior. Cardiac silhouette and mediastinal contours within normal limits. Mild calcification within aortic arch. There is mild-to-moderate right and mild left apical pleural thickening. Mild bilateral upper lung interstitial thickening/scarring. No new airspace opacity. No pleural effusion pneumothorax. No acute skeletal abnormality. Surgical clips again overlie the bilateral breasts. IMPRESSION: No acute cardiopulmonary disease process. Electronically Signed   By: Yvonne Kendall M.D.   On: 06/21/2022 13:11   DG Shoulder Right  Result Date: 06/21/2022 CLINICAL DATA:  Right shoulder pain after fall. EXAM: RIGHT SHOULDER - 2+ VIEW COMPARISON:  None Available. FINDINGS: The glenohumeral and acromioclavicular joint spaces are normally aligned. There is diffuse decreased bone mineralization. No acute fracture line is seen. No dislocation. The visualized portion of the right lung is unremarkable. IMPRESSION: No acute fracture is seen. Electronically Signed   By: Yvonne Kendall M.D.   On: 06/21/2022 13:07   DG Hip Unilat W or Wo Pelvis 2-3 Views Right  Result Date: 06/21/2022 CLINICAL DATA:  Hip pain after fall. EXAM: DG HIP (WITH OR WITHOUT PELVIS) 2-3V RIGHT COMPARISON:  Pelvis and right hip radiograph 05/19/2020; MRI right hip 06/03/2020 FINDINGS: There is a new linear lucency within the medial aspect of the right femoral neck. Minimal bilateral femoroacetabular joint space narrowing. Mild bilateral sacroiliac subchondral sclerosis. The previously described centrally lucent and peripherally  sclerotic focus within the lateral aspect of the right greater trochanter is less well visualized compared to 05/19/2020 radiograph. No definite suspicious bone lesion is seen. IMPRESSION: Nondisplaced acute medial right femoral neck fracture. Electronically Signed   By: Yvonne Kendall M.D.   On: 06/21/2022 13:06    Procedures Procedures    Medications Ordered in ED Medications  potassium chloride 10 mEq in 100 mL IVPB (has no administration in time range)  magnesium sulfate IVPB 2 g 50 mL (has no administration in time range)  morphine (PF) 4 MG/ML injection 4 mg (4 mg Intravenous Given 06/21/22 1253)  ondansetron [facility]) injection 4 mg (4 mg Intravenous Given 06/21/22 1253)  morphine (PF) 4 MG/ML injection 4 mg (4 mg Intravenous Given 06/21/22 1330)  ondansetron (ZOFRAN) injection 4 mg (4 mg Intravenous Given 06/21/22 1329)    ED Course/ Medical Decision Making/ A&P                           Medical Decision Making Amount and/or Complexity of Data Reviewed Labs: ordered. Radiology: ordered.  Risk Prescription drug management.   71 yo F with a chief complaint of a fall.  The patient recently was hospitalized for stroke.  She has had some difficulty with ambulation afterwards and requires a walker.  She lost her balance while walking with a walker and fell onto her right side.  She denies head injury denies loss consciousness denies neck pain denies back pain.  Complaining of pain mostly to the right hip.  On exam seems more consistent with a pelvic fracture.  Will obtain a plain film of the right hip.  Plain film of the right shoulder.  Blood work.  Reassess.  Plain film of the right hip independently interpreted by me with a right femoral neck fracture.  Plain film of the right shoulder without fracture or dislocation.  I discussed case with orthopedics recommended CT of the hip.  Recommended medical admission.  N.p.o. while they assess for the possibility of surgery today.  The  patient's potassium is critically low at 2.4.  We will replenish via IV.  Replenish mag.  Check mag level.  Will discuss with medicine for admission.  CRITICAL CARE Performed by: Cecilio Asper   Total critical care time: 35 minutes  Critical care time was exclusive of separately billable procedures and treating other patients.  Critical care was necessary to treat or prevent imminent or life-threatening deterioration.  Critical care was time spent personally by me on the following activities: development of treatment plan with patient and/or surrogate as well as nursing, discussions with consultants, evaluation of patient's response to treatment, examination of patient, obtaining history from patient or surrogate, ordering and performing treatments and interventions, ordering and review of laboratory studies, ordering and review of radiographic studies, pulse oximetry and re-evaluation of patient's condition.  The patients results and plan were reviewed and discussed.   Any x-rays performed were independently reviewed by myself.   Differential diagnosis were considered with the presenting HPI.  Medications  potassium chloride 10 mEq in 100 mL IVPB (has no administration in time range)  magnesium sulfate IVPB 2 g 50 mL (has no administration in time range)  morphine (PF) 4 MG/ML injection 4 mg (4 mg Intravenous Given 06/21/22 1253)  ondansetron (ZOFRAN) injection 4 mg (4 mg Intravenous Given 06/21/22 1253)  morphine (PF) 4 MG/ML injection 4 mg (4 mg Intravenous Given 06/21/22 1330)  ondansetron (ZOFRAN) injection 4 mg (4 mg Intravenous Given 06/21/22 1329)    Vitals:   06/21/22 1300  BP: (!) 143/84  Pulse: 65  Resp: 18  Temp: 98 F (36.7 C)  SpO2: 98%    Final diagnoses:  Closed fracture of neck of right femur, initial encounter ([place])    Admission/ observation were discussed with the admitting physician, patient and/or family and they are comfortable with the plan.          Final Clinical Impression(s) / ED Diagnoses Final diagnoses:  Closed fracture of neck of right femur, initial encounter ([place])    Rx / DC  Orders ED Discharge Orders     None         Deno Etienne, DO 06/21/22 1352

## 2022-06-21 NOTE — Anesthesia Preprocedure Evaluation (Signed)
Anesthesia Evaluation  Patient identified by MRN, date of birth, ID band Patient awake    Reviewed: Allergy & Precautions, NPO status , Patient's Chart, lab work & pertinent test results  Airway Mallampati: II  TM Distance: >3 FB Neck ROM: Full    Dental no notable dental hx.    Pulmonary neg pulmonary ROS,    Pulmonary exam normal breath sounds clear to auscultation       Cardiovascular hypertension, Pt. on medications and Pt. on home beta blockers Normal cardiovascular exam+ dysrhythmias Atrial Fibrillation  Rhythm:Regular Rate:Normal  Echo 05/13/22: EF 60-65%, no RWMA, normal DF, normal RVSF, trivial MR   Neuro/Psych CVA (05/2022) negative psych ROS   GI/Hepatic negative GI ROS, Neg liver ROS,   Endo/Other  Hypothyroidism   Renal/GU negative Renal ROS  negative genitourinary   Musculoskeletal negative musculoskeletal ROS (+)   Abdominal   Peds negative pediatric ROS (+)  Hematology  (+) Blood dyscrasia, anemia ,   Anesthesia Other Findings PLAVIX taken 9/19  Reproductive/Obstetrics negative OB ROS                           Anesthesia Physical Anesthesia Plan  ASA: 4  Anesthesia Plan: General   Post-op Pain Management: Ofirmev IV (intra-op)*   Induction: Intravenous  PONV Risk Score and Plan: 3 and Ondansetron, Dexamethasone and Treatment may vary due to age or medical condition  Airway Management Planned: Oral ETT  Additional Equipment:   Intra-op Plan:   Post-operative Plan: Extubation in OR  Informed Consent: I have reviewed the patients History and Physical, chart, labs and discussed the procedure including the risks, benefits and alternatives for the proposed anesthesia with the patient or authorized representative who has indicated his/her understanding and acceptance.     Dental advisory given  Plan Discussed with:   Anesthesia Plan Comments:       Anesthesia  Quick Evaluation

## 2022-06-21 NOTE — Plan of Care (Signed)

## 2022-06-21 NOTE — H&P (Addendum)
History and Physical    Patient: Angela Buchanan NLG:[id number] DOB: 1951-01-29 DOA: 06/21/2022 DOS: the patient was seen and examined on 06/21/2022 PCP: Glenis Smoker, MD  Patient coming from: Home  Chief Complaint:  Chief Complaint  Patient presents with   Fall   Hip Pain   HPI: Angela Buchanan is a 71 y.o. female with medical history significant of typical chest pain right breast cancer, carotid bruit, diverticulosis/diverticulitis, hypertension, premature atrial contractions, PVCs, bigeminy, seasonal allergies who was recently discharged from the hospital due to left pain large subcortical infarct who is coming to the emergency department due to having a fall at home.  No fever, chills or night sweats. No sore throat, rhinorrhea, dyspnea, wheezing or hemoptysis.  No chest pain, palpitations, diaphoresis, PND, orthopnea or pitting edema of the lower extremities.  No appetite changes, abdominal pain, diarrhea, constipation, melena or hematochezia.  Positive frequency, but no flank pain, dysuria or hematuria.  No polyuria, polydipsia, polyphagia or blurred vision.  ED course: Initial vital signs were temperature 98 F, pulse 65, respiration 18, BP 143/84 mmHg O2 sat 98% on patient received morphine 4 mg IVP, ondansetron 4 mg IVP and 2 g of magnesium sulfate IVPB.  Lab work: CBC shows a white count of 5.5, hemoglobin 12.2 g/dL platelets 435.  BMP with a potassium of 2.4 mmol/L and a glucose of 106 mg/L.  The rest of the BMP was normal.  Imaging: Right hip x-ray shows right femoral neck fracture.  CT of the right hip also showed small ill-defined hematoma seen in the subcutaneous soft tissues at the lateral aspect of the right hip and proximal thigh.  No acute findings on portable 1 view chest and right shoulder x-ray.   Review of Systems: As mentioned in the history of present illness. All other systems reviewed and are negative. Past Medical History:  Diagnosis Date   Atypical chest pain  07/19/2021   Bigeminy    no current med.   Breast cancer ([place]) 06/2017   right   Bruises easily    Carotid bruit 11/13/2019   Dental crowns present    History of diverticulitis    Hypertension    states under control with med., has been on med. x 5 yr.   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   Past Surgical History:  Procedure Laterality Date   ABDOMINAL HYSTERECTOMY     partial   BREAST LUMPECTOMY Right 06/22/2017   Malignant   BREAST LUMPECTOMY WITH RADIOACTIVE SEED AND SENTINEL LYMPH NODE BIOPSY Right 06/22/2017   Procedure: RIGHT BREAST LUMPECTOMY WITH RADIOACTIVE SEED AND RIGHT AXILLARY DEEP SENTINEL LYMPH NODE BIOPSY WITH BLUE DYE INJECTION;  Surgeon: Fanny Skates, MD;  Location: [facility];  Service: General;  Laterality: Right;   BREAST LUMPECTOMY WITH RADIOACTIVE SEED LOCALIZATION Left 06/22/2017   Procedure: LEFT BREAST LUMPECTOMY WITH RADIOACTIVE SEED LOCALIZATION;  Surgeon: Fanny Skates, MD;  Location: [facility];  Service: General;  Laterality: Left;   CATARACT EXTRACTION W/ INTRAOCULAR LENS  IMPLANT, BILATERAL Bilateral    EXCISION OF BREAST BIOPSY Left 06/22/2017   benign   LOOP RECORDER INSERTION N/A 05/16/2022   Procedure: LOOP RECORDER INSERTION;  Surgeon: Vickie Epley, MD;  Location: [facility];  Service: Cardiovascular;  Laterality: N/A;   TONSILLECTOMY     age 1   Social History:  reports that she has never smoked. She has never used smokeless tobacco. She reports that she does not drink alcohol and does not use drugs.  Allergies  Allergen Reactions   Atenolol Other (See Comments)    CHEST PAIN   Bisoprolol Other (See Comments)    "FEELS WEIRD"   Prednisone  Other (See Comments)    UNABLE TO FOCUS   Amoxicillin     unknown   Amoxicillin-Pot Clavulanate Diarrhea and Other (See Comments)   Hctz [Hydrochlorothiazide]     Hyponatremia    Rosuvastatin     myalgias   Latex Rash    Family History  Problem Relation Age of Onset   Hypertension Sister    Dementia Father    Stroke Mother    Stroke Maternal Grandmother    Stroke Maternal Grandfather    Stroke Paternal Grandmother    Stroke Paternal Grandfather    Stroke Sister    Stroke Brother    Dementia Brother    Heart attack Brother     Prior to Admission medications   Medication Sig Start Date End Date Taking? Authorizing Provider  amLODipine (NORVASC) 5 MG tablet Take 5 mg by mouth daily.   Yes [provider]  aspirin EC 81 MG tablet Take 1 tablet (81 mg total) by mouth daily. Swallow whole. 05/18/22  Yes August Albino, NP  calcium-vitamin D Darron Doom WITH D) 500-5 MG-MCG tablet Take 1 tablet by mouth daily with breakfast. 05/18/22  Yes August Albino, NP  cetirizine (ZYRTEC) 10 MG tablet Take 10 mg by mouth daily.   Yes [provider]  Cholecalciferol (VITAMIN D) 50 MCG (2000 UT) tablet Take 2,000 Units by mouth daily.   Yes [provider]  clobetasol ointment (TEMOVATE) 0.05 % Apply topically 2 (two) times daily as needed (itching). Patient taking differently: Apply 1 Application topically 2 (two) times daily as needed (itching). 05/17/22  Yes August Albino, NP  clopidogrel (PLAVIX) 75 MG tablet Take 1 tablet (75 mg total) by mouth daily. 05/18/22  Yes August Albino, NP  cyanocobalamin 1000 MCG tablet Take 1 tablet (1,000 mcg total) by mouth daily at 12 noon. Patient taking differently: Take 2,000 mcg by mouth daily at 12 noon. 05/17/22  Yes August Albino, NP  fluticasone (FLONASE) 50 MCG/ACT nasal spray Place 1 spray into both nostrils daily as needed for allergies or rhinitis.   Yes [provider]  hydrOXYzine (ATARAX) 25 MG tablet Take 25  mg by mouth 3 (three) times daily as needed for nausea or vomiting.   Yes [provider]  levothyroxine (SYNTHROID) 25 MCG tablet Take 1 tablet (25 mcg total) by mouth daily at 6 (six) AM. 05/18/22  Yes August Albino, NP  lisinopril (ZESTRIL) 40 MG tablet Take 40 mg by mouth daily.   Yes [provider]  meclizine (ANTIVERT) 25 MG tablet Take 25 mg by mouth daily as needed for dizziness.   Yes [provider]  metoprolol tartrate (LOPRESSOR) 25 MG tablet Take 25 mg by mouth every evening.   Yes [provider]  Multiple Vitamins-Minerals (CENTRUM SILVER 50+WOMEN PO) Take 1 tablet by mouth daily.   Yes [provider]  pantoprazole (PROTONIX) 40 MG tablet Take 1 tablet (40 mg total) by mouth daily at 12 noon. 05/17/22  Yes August Albino, NP  pravastatin (PRAVACHOL) 40 MG  tablet Take 1 tablet (40 mg total) by mouth daily. 05/18/22  Yes August Albino, NP  B Complex-C (B-COMPLEX WITH VITAMIN C) tablet Take 1 tablet by mouth daily. Patient not taking: Reported on 06/21/2022 05/18/22   August Albino, NP    Physical Exam: Vitals:   06/21/22 1300 06/21/22 1348  BP: (!) 143/84 138/61  Pulse: 65 72  Resp: 18 17  Temp: 98 F (36.7 C) 98.5 F (36.9 C)  SpO2: 98% 98%   Physical Exam Vitals and nursing note reviewed.  Constitutional:      Appearance: Normal appearance. She is not ill-appearing.  HENT:     Head: Normocephalic.     Nose: No rhinorrhea.  Eyes:     General: No scleral icterus.    Pupils: Pupils are equal, round, and reactive to light.  Neck:     Vascular: No JVD.  Cardiovascular:     Rate and Rhythm: Normal rate and regular rhythm.     Heart sounds: S1 normal and S2 normal.  Pulmonary:     Effort: Pulmonary effort is normal.     Breath sounds: No wheezing, rhonchi or rales.  Abdominal:     General: Bowel sounds are normal. There is no distension.     Palpations: Abdomen is soft.     Tenderness: There is no abdominal  tenderness.  Musculoskeletal:     Cervical back: Neck supple.     Right hip: Tenderness present. Decreased range of motion.     Right lower leg: No edema.     Left lower leg: No edema.  Skin:    General: Skin is warm and dry.  Neurological:     Mental Status: She is alert and oriented to person, place, and time.  Psychiatric:        Mood and Affect: Mood normal.        Behavior: Behavior normal.     Data Reviewed:  There are no new results to review at this time.  Assessment and Plan: Principal Problem:   Closed fracture of femur, neck ([place]) Admit to telemetry/inpatient. Ice area as needed. Buck's traction per protocol. Analgesics as needed. Antiemetics as needed. Consult TOC team. Consult nutritional services. PT evaluation after surgery. Orthopedic surgery evaluation appreciated.  Active Problems:   Essential hypertension Continue metoprolol 25 mg p.o. daily. Continue lisinopril 40 mg p.o. daily.    Pure hypercholesterolemia Continue pravastatin 40 mg p.o. daily.    CVA (cerebral vascular accident) ([place]) Positive hematoma on fracture site. Hold ASA and clopidogrel for now. Will resume in 24 to 48 hours postoperatively. Continue supportive care.    Urinary frequency Check urinalysis.    Hypokalemia Replacing.   Magnesium was supplemented. Follow-up potassium level.    Hypothyroidism Continue levothyroxine 25 mcg p.o. daily.    Advance Care Planning:   Code Status: Full Code   Consults: Orthopedic surgery.  Family Communication: Her husband was in the room.  Severity of Illness: The appropriate patient status for this patient is INPATIENT. Inpatient status is judged to be reasonable and necessary in order to provide the required intensity of service to ensure the patient's safety. The patient's presenting symptoms, physical exam findings, and initial radiographic and laboratory data in the context of their chronic comorbidities is felt to place them  at high risk for further clinical deterioration. Furthermore, it is not anticipated that the patient will be medically stable for discharge from the hospital within 2 midnights of admission.   * I certify  that at the point of admission it is my clinical judgment that the patient will require inpatient hospital care spanning beyond 2 midnights from the point of admission due to high intensity of service, high risk for further deterioration and high frequency of surveillance required.*  Author: Reubin Milan, MD 06/21/2022 3:32 PM  For on call review [email or web address].   This document was prepared using Dragon voice recognition software and may contain some unintended transcription errors.

## 2022-06-21 NOTE — ED Notes (Signed)
Report given to short stay.  

## 2022-06-21 NOTE — ED Notes (Signed)
Per short stay patient is to receive 3 runs of K+ with repeat potassium level then will re-evaluate surgery

## 2022-06-21 NOTE — ED Triage Notes (Signed)
BIBA Per EMS: Pt coming from home w/ c/o fall, right hip pain. Swelling to right leg  Hx stroke w/ right side weakness VSS  No LOC, Denies hitting head.  20G L hand

## 2022-06-22 ENCOUNTER — Inpatient Hospital Stay (HOSPITAL_COMMUNITY): Payer: Medicare PPO | Admitting: Anesthesiology

## 2022-06-22 ENCOUNTER — Encounter (HOSPITAL_COMMUNITY)
Admission: EM | Disposition: A | Discharge status: Skilled Nursing Facility | Payer: Self-pay | Source: Home / Self Care | Attending: Internal Medicine

## 2022-06-22 ENCOUNTER — Encounter (HOSPITAL_COMMUNITY): Payer: Self-pay

## 2022-06-22 ENCOUNTER — Inpatient Hospital Stay (HOSPITAL_COMMUNITY): Payer: Medicare PPO

## 2022-06-22 ENCOUNTER — Other Ambulatory Visit: Payer: Self-pay

## 2022-06-22 DIAGNOSIS — Z8673 Personal history of transient ischemic attack (TIA), and cerebral infarction without residual deficits: Secondary | ICD-10-CM

## 2022-06-22 DIAGNOSIS — D638 Anemia in other chronic diseases classified elsewhere: Secondary | ICD-10-CM | POA: Diagnosis not present

## 2022-06-22 DIAGNOSIS — E039 Hypothyroidism, unspecified: Secondary | ICD-10-CM | POA: Diagnosis not present

## 2022-06-22 DIAGNOSIS — I10 Essential (primary) hypertension: Secondary | ICD-10-CM

## 2022-06-22 DIAGNOSIS — S72001D Fracture of unspecified part of neck of right femur, subsequent encounter for closed fracture with routine healing: Secondary | ICD-10-CM

## 2022-06-22 DIAGNOSIS — E876 Hypokalemia: Secondary | ICD-10-CM

## 2022-06-22 DIAGNOSIS — E440 Moderate protein-calorie malnutrition: Secondary | ICD-10-CM | POA: Insufficient documentation

## 2022-06-22 DIAGNOSIS — I4891 Unspecified atrial fibrillation: Secondary | ICD-10-CM

## 2022-06-22 DIAGNOSIS — S72001A Fracture of unspecified part of neck of right femur, initial encounter for closed fracture: Secondary | ICD-10-CM | POA: Diagnosis not present

## 2022-06-22 DIAGNOSIS — I639 Cerebral infarction, unspecified: Secondary | ICD-10-CM | POA: Diagnosis not present

## 2022-06-22 HISTORY — PX: PERCUTANEOUS PINNING: SHX2209

## 2022-06-22 LAB — COMPREHENSIVE METABOLIC PANEL
ALT: 20 U/L (ref 0–44)
AST: 22 U/L (ref 15–41)
Albumin: 3.5 g/dL (ref 3.5–5.0)
Alkaline Phosphatase: 61 U/L (ref 38–126)
Anion gap: 5 (ref 5–15)
BUN: 13 mg/dL (ref 8–23)
CO2: 21 mmol/L — ABNORMAL LOW (ref 22–32)
Calcium: 9.1 mg/dL (ref 8.9–10.3)
Chloride: 112 mmol/L — ABNORMAL HIGH (ref 98–111)
Creatinine, Ser: 0.71 mg/dL (ref 0.44–1.00)
GFR, Estimated: >60 mL/min (ref >60)
Glucose, Bld: 153 mg/dL — ABNORMAL HIGH (ref 70–99)
Potassium: 3.4 mmol/L — ABNORMAL LOW (ref 3.5–5.1)
Sodium: 138 mmol/L (ref 135–145)
Total Bilirubin: 0.5 mg/dL (ref 0.3–1.2)
Total Protein: 6 g/dL — ABNORMAL LOW (ref 6.5–8.1)

## 2022-06-22 LAB — CBC
HCT: 32.1 % — ABNORMAL LOW (ref 36.0–46.0)
Hemoglobin: 11 g/dL — ABNORMAL LOW (ref 12.0–15.0)
MCH: 34.7 pg — ABNORMAL HIGH (ref 26.0–34.0)
MCHC: 34.3 g/dL (ref 30.0–36.0)
MCV: 101.3 fL — ABNORMAL HIGH (ref 80.0–100.0)
Platelets: 406 10*3/uL — ABNORMAL HIGH (ref 150–400)
RBC: 3.17 MIL/uL — ABNORMAL LOW (ref 3.87–5.11)
RDW: 12.9 % (ref 11.5–15.5)
WBC: 8.6 10*3/uL (ref 4.0–10.5)
nRBC: 0 % (ref 0.0–0.2)

## 2022-06-22 SURGERY — PINNING, EXTREMITY, PERCUTANEOUS
Anesthesia: General | Site: Hip | Laterality: Right

## 2022-06-22 MED ORDER — CLOPIDOGREL BISULFATE 75 MG PO TABS
75.0000 mg | ORAL_TABLET | Freq: Every day | ORAL | Status: DC
  Administered 2022-06-23 – 2022-06-26 (×4): 75 mg via ORAL
  Filled 2022-06-22 (×5): qty 1

## 2022-06-22 MED ORDER — CHLORHEXIDINE GLUCONATE 4 % EX LIQD: 60.0000 mL | Freq: Once | CUTANEOUS | Status: DC

## 2022-06-22 MED ORDER — CHLORHEXIDINE GLUCONATE 0.12 % MT SOLN
15.0000 mL | Freq: Once | OROMUCOSAL | Status: AC
  Administered 2022-06-22: 15 mL via OROMUCOSAL

## 2022-06-22 MED ORDER — PHENOL 1.4 % MT LIQD: 1.0000 | OROMUCOSAL | Status: DC | PRN

## 2022-06-22 MED ORDER — ADULT MULTIVITAMIN W/MINERALS CH
1.0000 | ORAL_TABLET | Freq: Every day | ORAL | Status: DC
  Administered 2022-06-23 – 2022-06-26 (×4): 1 via ORAL
  Filled 2022-06-22 (×5): qty 1

## 2022-06-22 MED ORDER — HYDROCODONE-ACETAMINOPHEN 5-325 MG PO TABS
1.0000 | ORAL_TABLET | ORAL | Status: DC | PRN
  Administered 2022-06-22 – 2022-06-26 (×11): 2 via ORAL
  Filled 2022-06-22 (×11): qty 2

## 2022-06-22 MED ORDER — DOCUSATE SODIUM 100 MG PO CAPS
100.0000 mg | ORAL_CAPSULE | Freq: Two times a day (BID) | ORAL | Status: DC
  Administered 2022-06-22 – 2022-06-26 (×8): 100 mg via ORAL
  Filled 2022-06-22 (×8): qty 1

## 2022-06-22 MED ORDER — LACTATED RINGERS IV SOLN: INTRAVENOUS | Status: DC

## 2022-06-22 MED ORDER — FENTANYL CITRATE (PF) 250 MCG/5ML IJ SOLN
INTRAMUSCULAR | Status: AC
  Filled 2022-06-22: qty 5

## 2022-06-22 MED ORDER — 0.9 % SODIUM CHLORIDE (POUR BTL) OPTIME
TOPICAL | Status: DC | PRN
  Administered 2022-06-22: 1000 mL

## 2022-06-22 MED ORDER — ACETAMINOPHEN 500 MG PO TABS
500.0000 mg | ORAL_TABLET | Freq: Four times a day (QID) | ORAL | Status: AC
  Administered 2022-06-22 – 2022-06-23 (×2): 500 mg via ORAL
  Filled 2022-06-22 (×2): qty 1

## 2022-06-22 MED ORDER — ENSURE ENLIVE PO LIQD
237.0000 mL | Freq: Two times a day (BID) | ORAL | Status: DC
  Administered 2022-06-22 – 2022-06-25 (×5): 237 mL via ORAL

## 2022-06-22 MED ORDER — POVIDONE-IODINE 10 % EX SWAB
2.0000 | Freq: Once | CUTANEOUS | Status: AC
  Administered 2022-06-22: 2 via TOPICAL

## 2022-06-22 MED ORDER — ONDANSETRON HCL 4 MG/2ML IJ SOLN: 4.0000 mg | Freq: Once | INTRAMUSCULAR | Status: DC | PRN

## 2022-06-22 MED ORDER — FENTANYL CITRATE PF 50 MCG/ML IJ SOSY: 25.0000 ug | PREFILLED_SYRINGE | INTRAMUSCULAR | Status: DC | PRN

## 2022-06-22 MED ORDER — AMISULPRIDE (ANTIEMETIC) 5 MG/2ML IV SOLN: 10.0000 mg | Freq: Once | INTRAVENOUS | Status: DC | PRN

## 2022-06-22 MED ORDER — OXYCODONE HCL 5 MG PO TABS: 5.0000 mg | ORAL_TABLET | Freq: Once | ORAL | Status: DC | PRN

## 2022-06-22 MED ORDER — PROPOFOL 10 MG/ML IV BOLUS
INTRAVENOUS | Status: DC | PRN
  Administered 2022-06-22: 130 mg via INTRAVENOUS

## 2022-06-22 MED ORDER — ASPIRIN 81 MG PO TBEC
81.0000 mg | DELAYED_RELEASE_TABLET | Freq: Every day | ORAL | Status: DC
  Administered 2022-06-23 – 2022-06-26 (×4): 81 mg via ORAL
  Filled 2022-06-22 (×5): qty 1

## 2022-06-22 MED ORDER — MENTHOL 3 MG MT LOZG
1.0000 | LOZENGE | OROMUCOSAL | Status: DC | PRN
  Administered 2022-06-22: 3 mg via ORAL
  Filled 2022-06-22: qty 9

## 2022-06-22 MED ORDER — VANCOMYCIN HCL 1000 MG IV SOLR
INTRAVENOUS | Status: DC | PRN
  Administered 2022-06-22: 1000 mg via TOPICAL

## 2022-06-22 MED ORDER — POTASSIUM CHLORIDE CRYS ER 20 MEQ PO TBCR: 40.0000 meq | EXTENDED_RELEASE_TABLET | Freq: Once | ORAL | Status: DC

## 2022-06-22 MED ORDER — CEFAZOLIN SODIUM-DEXTROSE 2-4 GM/100ML-% IV SOLN
2.0000 g | INTRAVENOUS | Status: AC
  Administered 2022-06-22: 2 g via INTRAVENOUS
  Filled 2022-06-22: qty 100

## 2022-06-22 MED ORDER — BUPIVACAINE HCL 0.25 % IJ SOLN
INTRAMUSCULAR | Status: DC | PRN
  Administered 2022-06-22: 20 mL

## 2022-06-22 MED ORDER — ONDANSETRON HCL 4 MG/2ML IJ SOLN: 4.0000 mg | Freq: Four times a day (QID) | INTRAMUSCULAR | Status: DC | PRN

## 2022-06-22 MED ORDER — HYDROMORPHONE HCL 1 MG/ML IJ SOLN
INTRAMUSCULAR | Status: AC
  Administered 2022-06-22: 0.5 mg via INTRAVENOUS
  Filled 2022-06-22: qty 1

## 2022-06-22 MED ORDER — FENTANYL CITRATE (PF) 100 MCG/2ML IJ SOLN
INTRAMUSCULAR | Status: DC | PRN
  Administered 2022-06-22: 50 ug via INTRAVENOUS

## 2022-06-22 MED ORDER — POLYETHYLENE GLYCOL 3350 17 G PO PACK
17.0000 g | PACK | Freq: Every day | ORAL | Status: DC | PRN
  Administered 2022-06-23 – 2022-06-25 (×2): 17 g via ORAL
  Filled 2022-06-22 (×2): qty 1

## 2022-06-22 MED ORDER — ONDANSETRON HCL 4 MG PO TABS
4.0000 mg | ORAL_TABLET | Freq: Four times a day (QID) | ORAL | Status: DC | PRN
  Administered 2022-06-23: 4 mg via ORAL
  Filled 2022-06-22: qty 1

## 2022-06-22 MED ORDER — ACETAMINOPHEN 325 MG PO TABS: 325.0000 mg | ORAL_TABLET | Freq: Four times a day (QID) | ORAL | Status: DC | PRN

## 2022-06-22 MED ORDER — ONDANSETRON HCL 4 MG/2ML IJ SOLN
INTRAMUSCULAR | Status: DC | PRN
  Administered 2022-06-22: 4 mg via INTRAVENOUS

## 2022-06-22 MED ORDER — MORPHINE SULFATE (PF) 2 MG/ML IV SOLN
0.5000 mg | INTRAVENOUS | Status: DC | PRN
  Administered 2022-06-22: 1 mg via INTRAVENOUS
  Administered 2022-06-22: 0.5 mg via INTRAVENOUS
  Administered 2022-06-23 – 2022-06-24 (×3): 1 mg via INTRAVENOUS
  Filled 2022-06-22 (×5): qty 1

## 2022-06-22 MED ORDER — SUGAMMADEX SODIUM 200 MG/2ML IV SOLN
INTRAVENOUS | Status: DC | PRN
  Administered 2022-06-22: 100 mg via INTRAVENOUS

## 2022-06-22 MED ORDER — ENOXAPARIN SODIUM 40 MG/0.4ML IJ SOSY: 40.0000 mg | PREFILLED_SYRINGE | INTRAMUSCULAR | Status: DC

## 2022-06-22 MED ORDER — DEXAMETHASONE SODIUM PHOSPHATE 10 MG/ML IJ SOLN
INTRAMUSCULAR | Status: DC | PRN
  Administered 2022-06-22: 10 mg via INTRAVENOUS

## 2022-06-22 MED ORDER — LIDOCAINE 2% (20 MG/ML) 5 ML SYRINGE
INTRAMUSCULAR | Status: DC | PRN
  Administered 2022-06-22: 60 mg via INTRAVENOUS

## 2022-06-22 MED ORDER — VANCOMYCIN HCL 1000 MG IV SOLR
INTRAVENOUS | Status: AC
  Filled 2022-06-22: qty 20

## 2022-06-22 MED ORDER — METHOCARBAMOL 500 MG IVPB - SIMPLE MED: 500.0000 mg | Freq: Four times a day (QID) | INTRAVENOUS | Status: DC | PRN

## 2022-06-22 MED ORDER — METHOCARBAMOL 500 MG PO TABS
500.0000 mg | ORAL_TABLET | Freq: Four times a day (QID) | ORAL | Status: DC | PRN
  Administered 2022-06-22 – 2022-06-26 (×8): 500 mg via ORAL
  Filled 2022-06-22 (×8): qty 1

## 2022-06-22 MED ORDER — HYDROCODONE-ACETAMINOPHEN 7.5-325 MG PO TABS
1.0000 | ORAL_TABLET | ORAL | Status: DC | PRN
  Administered 2022-06-22: 2 via ORAL
  Administered 2022-06-25: 1 via ORAL
  Filled 2022-06-22: qty 2
  Filled 2022-06-22: qty 1

## 2022-06-22 MED ORDER — OXYCODONE HCL 5 MG/5ML PO SOLN: 5.0000 mg | Freq: Once | ORAL | Status: DC | PRN

## 2022-06-22 MED ORDER — BISACODYL 5 MG PO TBEC
5.0000 mg | DELAYED_RELEASE_TABLET | Freq: Every day | ORAL | Status: DC | PRN
  Administered 2022-06-23 – 2022-06-25 (×3): 5 mg via ORAL
  Filled 2022-06-22 (×3): qty 1

## 2022-06-22 MED ORDER — METHOCARBAMOL 500 MG IVPB - SIMPLE MED
INTRAVENOUS | Status: AC
  Administered 2022-06-22: 500 mg via INTRAVENOUS
  Filled 2022-06-22: qty 55

## 2022-06-22 MED ORDER — CEFAZOLIN SODIUM-DEXTROSE 2-4 GM/100ML-% IV SOLN
2.0000 g | Freq: Four times a day (QID) | INTRAVENOUS | Status: AC
  Administered 2022-06-22 (×2): 2 g via INTRAVENOUS
  Filled 2022-06-22 (×2): qty 100

## 2022-06-22 MED ORDER — ROCURONIUM BROMIDE 10 MG/ML (PF) SYRINGE
PREFILLED_SYRINGE | INTRAVENOUS | Status: DC | PRN
  Administered 2022-06-22: 40 mg via INTRAVENOUS

## 2022-06-22 MED ORDER — HYDROMORPHONE HCL 1 MG/ML IJ SOLN
0.2500 mg | INTRAMUSCULAR | Status: DC | PRN
  Administered 2022-06-22 (×2): 0.5 mg via INTRAVENOUS

## 2022-06-22 SURGICAL SUPPLY — 52 items
BAG COUNTER SPONGE SURGICOUNT (BAG) IMPLANT
BNDG COHESIVE 4X5 TAN STRL LF (GAUZE/BANDAGES/DRESSINGS) ×1 IMPLANT
CLSR STERI-STRIP ANTIMIC 1/2X4 (GAUZE/BANDAGES/DRESSINGS) ×1 IMPLANT
COVER SURGICAL LIGHT HANDLE (MISCELLANEOUS) ×1 IMPLANT
DERMABOND ADVANCED .7 DNX12 (GAUZE/BANDAGES/DRESSINGS) ×1 IMPLANT
DRAPE C-ARM 42X120 X-RAY (DRAPES) ×1 IMPLANT
DRAPE C-ARMOR (DRAPES) ×1 IMPLANT
DRAPE INCISE IOBAN 66X45 STRL (DRAPES) ×1 IMPLANT
DRESSING AQUACEL AG SP 3.5X6 (GAUZE/BANDAGES/DRESSINGS) IMPLANT
DRESSING MEPILEX FLEX 4X4 (GAUZE/BANDAGES/DRESSINGS) ×1 IMPLANT
DRSG AQUACEL AG SP 3.5X6 (GAUZE/BANDAGES/DRESSINGS) ×1
DRSG MEPILEX FLEX 4X4 (GAUZE/BANDAGES/DRESSINGS) ×1
DURAPREP 26ML APPLICATOR (WOUND CARE) ×1 IMPLANT
ELECT REM PT RETURN 15FT ADLT (MISCELLANEOUS) ×1 IMPLANT
FACESHIELD WRAPAROUND (MASK) IMPLANT
FACESHIELD WRAPAROUND OR TEAM (MASK) IMPLANT
GLOVE BIOGEL PI IND STRL 6.5 (GLOVE) ×1 IMPLANT
GLOVE BIOGEL PI IND STRL 8 (GLOVE) ×1 IMPLANT
GLOVE INDICATOR 6.5 STRL GRN (GLOVE) ×2 IMPLANT
GLOVE SURG MICRO LTX SZ8 (GLOVE) ×1 IMPLANT
GLOVE SURG PROTEXIS BL SZ6.5 (GLOVE) ×1 IMPLANT
GLOVE SURG SYN 6.5 PF PI BL (GLOVE) ×1 IMPLANT
GOWN STRL REUS W/ TWL LRG LVL3 (GOWN DISPOSABLE) ×2 IMPLANT
GOWN STRL REUS W/TWL LRG LVL3 (GOWN DISPOSABLE) ×2
KIT BASIN OR (CUSTOM PROCEDURE TRAY) ×1 IMPLANT
KIT TURNOVER KIT A (KITS) IMPLANT
MANIFOLD NEPTUNE II (INSTRUMENTS) ×1 IMPLANT
NDL SAFETY ECLIP 18X1.5 (MISCELLANEOUS) IMPLANT
NS IRRIG 1000ML POUR BTL (IV SOLUTION) ×1 IMPLANT
PACK GENERAL/GYN (CUSTOM PROCEDURE TRAY) ×1 IMPLANT
PACK UNIVERSAL I (CUSTOM PROCEDURE TRAY) ×1 IMPLANT
PAD ARMBOARD 7.5X6 YLW CONV (MISCELLANEOUS) ×2 IMPLANT
PIN GUIDE THRD AR 3.2X330 (PIN) IMPLANT
SCREW CANN 7X80 (Screw) IMPLANT
SCREW CANN FT 7X75 (Screw) IMPLANT
SCREW CANN FT 7X80 (Screw) IMPLANT
SCREW CANN THRD 7X75 (Screw) IMPLANT
STAPLER VISISTAT 35W (STAPLE) IMPLANT
STRIP CLOSURE SKIN 1/2X4 (GAUZE/BANDAGES/DRESSINGS) IMPLANT
SUT MNCRL AB 4-0 PS2 18 (SUTURE) IMPLANT
SUT VIC AB 0 CT1 27 (SUTURE) ×1
SUT VIC AB 0 CT1 27XBRD ANTBC (SUTURE) IMPLANT
SUT VIC AB 2-0 CT1 27 (SUTURE) ×1
SUT VIC AB 2-0 CT1 TAPERPNT 27 (SUTURE) ×1 IMPLANT
SUT VIC AB 3-0 CT1 27 (SUTURE) ×1
SUT VIC AB 3-0 CT1 TAPERPNT 27 (SUTURE) ×1 IMPLANT
SUT VIC AB 3-0 SH 27 (SUTURE)
SUT VIC AB 3-0 SH 27X BRD (SUTURE) IMPLANT
SYR 20ML LL LF (SYRINGE) IMPLANT
TOWEL OR 17X26 10 PK STRL BLUE (TOWEL DISPOSABLE) ×2 IMPLANT
WASHER FLAT 7.0 (Washer) IMPLANT
WATER STERILE IRR 1000ML POUR (IV SOLUTION) ×1 IMPLANT

## 2022-06-22 NOTE — Transfer of Care (Signed)
Immediate Anesthesia Transfer of Care Note  Patient: Angela Buchanan  Procedure(s) Performed: PERCUTANEOUS PINNING OF RIGHT HIP (Right: Hip)  Patient Location: PACU  Anesthesia Type:General  Level of Consciousness: sedated, patient cooperative and responds to stimulation  Airway & Oxygen Therapy: Patient Spontanous Breathing and Patient connected to face mask oxygen  Post-op Assessment: Report given to RN and Post -op Vital signs reviewed and stable  Post vital signs: Reviewed and stable  Last Vitals:  Vitals Value Taken Time  BP 172/68 06/22/22 1333  Temp    Pulse 60 06/22/22 1335  Resp 16 06/22/22 1335  SpO2 100 % 06/22/22 1335  Vitals shown include unvalidated device data.  Last Pain:  Vitals:   06/22/22 1137  TempSrc:   PainSc: 6       Patients Stated Pain Goal: 5 (71/16/57 9038)  Complications:  Encounter Notable Events  Notable Event Outcome Phase Comment  Difficult to intubate - unexpected  Intraprocedure Filed from anesthesia note documentation.

## 2022-06-22 NOTE — Anesthesia Postprocedure Evaluation (Signed)
Anesthesia Post Note  Patient: Angela Buchanan  Procedure(s) Performed: PERCUTANEOUS PINNING OF RIGHT HIP (Right: Hip)     Patient location during evaluation: PACU Anesthesia Type: General Level of consciousness: awake and alert Pain management: pain level controlled Vital Signs Assessment: post-procedure vital signs reviewed and stable Respiratory status: spontaneous breathing, nonlabored ventilation and respiratory function stable Cardiovascular status: blood pressure returned to baseline and stable Postop Assessment: no apparent nausea or vomiting Anesthetic complications: yes   Encounter Notable Events  Notable Event Outcome Phase Comment  Difficult to intubate - unexpected  Intraprocedure Filed from anesthesia note documentation.    Last Vitals:  Vitals:   06/22/22 1530 06/22/22 1741  BP: (!) 149/79 133/72  Pulse: 80 68  Resp: 16 17  Temp: 36.7 C 36.7 C  SpO2: 95% 99%    Last Pain:  Vitals:   06/22/22 1825  TempSrc:   PainSc: 8                  Lidia Collum

## 2022-06-22 NOTE — Progress Notes (Signed)
PROGRESS NOTE    Angela Buchanan  ZGY:[id number] DOB: [birth year] DOA: 06/21/2022 PCP: Glenis Smoker, MD    Chief Complaint  Patient presents with   Fall   Hip Pain    Brief Narrative:   Angela Buchanan is a 71 y.o. female with medical history significant of typical chest pain right breast cancer, carotid bruit, diverticulosis/diverticulitis, hypertension, premature atrial contractions, PVCs, bigeminy, seasonal allergies who was recently discharged from the hospital due to left pain large subcortical infarct who is coming to the emergency department due to having a fall at home.   Right hip x-ray shows right femoral neck fracture.  CT of the right hip also showed small ill-defined hematoma seen in the subcutaneous soft tissues at the lateral aspect of the right hip and proximal thigh.   Assessment & Plan:   Principal Problem:   Closed fracture of femur, neck (HCC) Active Problems:   Essential hypertension   Pure hypercholesterolemia   CVA (cerebral vascular accident) ([place])   Urinary frequency   Hypokalemia   Hypothyroidism   Right femoral neck fracture Plan for surgical repair later today.  Pain control and therapy evaluations later today and tomorrow.  Gentle hydration.    Hypertension; BP parameters are optimal.     Hypothyroidism:  Resume synthroid.    Hyperlipidemia:  Resume statin.     Hypokalemia:  Replaced and recheck levels in am.    Macrocytic anemia:  Check anemia panel.  Hemoglobin dropped from 12.2 on admission to 11 today.  Continue to  monitor as CT hip show ill-defined hematoma seen in the subcutaneous soft tissues at the lateral aspect of the right hip and proximal thigh.    DVT prophylaxis: Lovenox to start from tomorrow.  Code Status: full code.  Family Communication: None at bedside.  Disposition:   Status is: Inpatient Remains inpatient appropriate because: OR today for    Level of care: Telemetry Consultants:  Orthopedics.    Procedures: none.   Antimicrobials: none.   Subjective: Reports pain in the right leg.   Objective: Vitals:   06/22/22 0050 06/22/22 0132 06/22/22 0520 06/22/22 0932  BP:  138/77 131/67 (!) 130/53  Pulse:  69 60 62  Resp:  '14 14 17  '$ Temp:  98 F (36.7 C) 97.8 F (36.6 C) 98.2 F (36.8 C)  TempSrc:    Oral  SpO2:  97% 98% 94%  Weight: 49.4 kg     Height: '5\' 2"'$  (1.575 m)       Intake/Output Summary (Last 24 hours) at 06/22/2022 1020 Last data filed at 06/22/2022 0622 Gross per 24 hour  Intake 1425.52 ml  Output 820 ml  Net 605.52 ml   Filed Weights   06/22/22 0050  Weight: 49.4 kg    Examination:  General exam: Appears calm and comfortable  Respiratory system: Clear to auscultation. Respiratory effort normal. Cardiovascular system: S1 & S2 heard, RRR. No JVD, . No pedal edema. Gastrointestinal system: Abdomen is nondistended, soft and nontender.  Normal bowel sounds heard. Central nervous system: Alert and oriented. No focal neurological deficits. Extremities: Symmetric 5 x 5 power. Skin: No rashes, lesions or ulcers Psychiatry: Mood & affect appropriate.     Data Reviewed: I have personally reviewed following labs and imaging studies  CBC: Recent Labs  Lab 06/21/22 1231 06/22/22 0412  WBC 5.5 8.6  NEUTROABS 4.3  --   HGB 12.2 11.0*  HCT 35.8* 32.1*  MCV 101.4* 101.3*  PLT 435* 406*  Basic Metabolic Panel: Recent Labs  Lab 06/21/22 1231 06/21/22 1815 06/22/22 0412  NA 141  --  138  K 2.4* 2.9* 3.4*  CL 110  --  112*  CO2 22  --  21*  GLUCOSE 106*  --  153*  BUN 12  --  13  CREATININE 0.76  --  0.71  CALCIUM 9.2  --  9.1  MG 2.0  --   --     GFR: Estimated Creatinine Clearance: 49.6 mL/min (by C-G formula based on SCr of 0.71 mg/dL).  Liver Function Tests: Recent Labs  Lab 06/22/22 0412  AST 22  ALT 20  ALKPHOS 61  BILITOT 0.5  PROT 6.0*  ALBUMIN 3.5    CBG: No results for input(s): "GLUCAP" in the last 168  hours.   Recent Results (from the past 240 hour(s))  Surgical pcr screen     Status: None   Collection Time: 06/21/22  4:42 PM   Specimen: Nasal Mucosa; Nasal Swab  Result Value Ref Range Status   MRSA, PCR NEGATIVE NEGATIVE Final   Staphylococcus aureus NEGATIVE NEGATIVE Final    Comment: (NOTE) The Xpert SA Assay (FDA approved for NASAL specimens in patients 81 years of age and older), is one component of a comprehensive surveillance program. It is not intended to diagnose infection nor to guide or monitor treatment. Performed at [facility], [place] [address] [place], [place] [address]          Radiology Studies: CT Hip Right Wo Contrast  Result Date: 06/21/2022 CLINICAL DATA:  Hip trauma, fracture suspected, xray done EXAM: CT OF THE RIGHT HIP WITHOUT CONTRAST TECHNIQUE: Multidetector CT imaging of the right hip was performed according to the standard protocol. Multiplanar CT image reconstructions were also generated. RADIATION DOSE REDUCTION: This exam was performed according to the departmental dose-optimization program which includes automated exposure control, adjustment of the mA and/or kV according to patient size and/or use of iterative reconstruction technique. COMPARISON:  X-ray 06/21/2022, MRI 06/03/2020 FINDINGS: Bones/Joint/Cartilage Acute transcervical right femoral neck fracture with minimal impaction (series 6, image 27). No fracture extension to the femoral head or into the intertrochanteric region. Normal hip joint alignment without dislocation. No appreciable joint effusion. Imaged right hemipelvis intact without fracture or diastasis. Previously described lesion of the right greater trochanter is not less conspicuous on the current exam (series 2, image 63), likely benign. No suspicious lytic or sclerotic bone lesion is seen. Ligaments Suboptimally assessed by CT. Muscles and Tendons No acute musculotendinous abnormality by CT. Soft tissues Soft  tissue swelling with small ill-defined hematomas in the subcutaneous soft tissues at the lateral aspect of the right hip and proximal thigh. No right inguinal lymphadenopathy. Scattered vascular calcification. IMPRESSION: 1. Acute transcervical right femoral neck fracture with minimal impaction. 2. Soft tissue swelling with small ill-defined hematomas in the subcutaneous soft tissues at the lateral aspect of the right hip and proximal thigh. Electronically Signed   By: Davina Poke D.O.   On: 06/21/2022 14:02   DG Chest 1 View  Result Date: 06/21/2022 CLINICAL DATA:  Acute right hip fracture.  Pain. EXAM: CHEST  1 VIEW COMPARISON:  AP chest 05/19/2020 FINDINGS: A likely cardiac loop recorder overlies the left hemithorax, new from prior. Cardiac silhouette and mediastinal contours within normal limits. Mild calcification within aortic arch. There is mild-to-moderate right and mild left apical pleural thickening. Mild bilateral upper lung interstitial thickening/scarring. No new airspace opacity. No pleural effusion pneumothorax. No acute skeletal  abnormality. Surgical clips again overlie the bilateral breasts. IMPRESSION: No acute cardiopulmonary disease process. Electronically Signed   By: Yvonne Kendall M.D.   On: 06/21/2022 13:11   DG Shoulder Right  Result Date: 06/21/2022 CLINICAL DATA:  Right shoulder pain after fall. EXAM: RIGHT SHOULDER - 2+ VIEW COMPARISON:  None Available. FINDINGS: The glenohumeral and acromioclavicular joint spaces are normally aligned. There is diffuse decreased bone mineralization. No acute fracture line is seen. No dislocation. The visualized portion of the right lung is unremarkable. IMPRESSION: No acute fracture is seen. Electronically Signed   By: Yvonne Kendall M.D.   On: 06/21/2022 13:07   DG Hip Unilat W or Wo Pelvis 2-3 Views Right  Result Date: 06/21/2022 CLINICAL DATA:  Hip pain after fall. EXAM: DG HIP (WITH OR WITHOUT PELVIS) 2-3V RIGHT COMPARISON:  Pelvis and  right hip radiograph 05/19/2020; MRI right hip 06/03/2020 FINDINGS: There is a new linear lucency within the medial aspect of the right femoral neck. Minimal bilateral femoroacetabular joint space narrowing. Mild bilateral sacroiliac subchondral sclerosis. The previously described centrally lucent and peripherally sclerotic focus within the lateral aspect of the right greater trochanter is less well visualized compared to 05/19/2020 radiograph. No definite suspicious bone lesion is seen. IMPRESSION: Nondisplaced acute medial right femoral neck fracture. Electronically Signed   By: Yvonne Kendall M.D.   On: 06/21/2022 13:06        Scheduled Meds:  acetaminophen  1,000 mg Oral Once   amLODipine  5 mg Oral Daily   levothyroxine  25 mcg Oral Q0600   lisinopril  40 mg Oral Daily   metoprolol tartrate  25 mg Oral QPM   pantoprazole  40 mg Oral Q1200   potassium chloride  40 mEq Oral Once   povidone-iodine  2 Application Topical Once   pravastatin  40 mg Oral Daily   Continuous Infusions:  sodium chloride 50 mL/hr at 06/21/22 2218    ceFAZolin (ANCEF) IV       LOS: 1 day        Hosie Poisson, MD [facility]   To contact the attending provider between 7A-7P or the covering provider during after hours 7P-7A, please log into the web site [email or web address] and access using universal [place] password for that web site. If you do not have the password, please call the hospital operator.  06/22/2022, 10:20 AM

## 2022-06-22 NOTE — Interval H&P Note (Signed)
All questions answered, patient wants to proceed with procedure. ? ?

## 2022-06-22 NOTE — Consult Note (Signed)
ORTHOPAEDIC CONSULTATION  REQUESTING PHYSICIAN: Hosie Poisson, MD  Chief Complaint: right hip pain  HPI: Angela Buchanan is a 71 y.o. female with  medical history significant of atypical chest pain, right breast cancer, carotid bruit, diverticulosis/diverticulitis, hypertension, premature atrial contractions, PVCs, bigeminy, seasonal allergies who was recently discharged from the hospital due to left pain large subcortical infarct who is coming to the emergency department due to having a fall at home. She landed on her right hip. She had immediate pain and inability to weight bear. She was taken to [facility] Emergency Department. Work-up in the ED was significant for right femoral neck fracture. Orthopedics was consulted.  Patient notes she is still having pain in the right hip. It is improved with medications. She normally ambulates with a walker since her stroke. This affected her right side. She states her right lower leg was able to rehab quicker than her right arm. She still has limited function of the right arm. She denies any pain besides the right hip. She lives at home with her husband. She was recently discharged from [facility]. She believes she was discharged too soon. Once she was at home they had difficulty getting a home health nurse to come to the house. She is very frustrated by this. No previous orthopedic issues.   Past Medical History:  Diagnosis Date   Atypical chest pain 07/19/2021   Bigeminy    no current med.   Breast cancer ([place]) 06/2017   right   Bruises easily    Carotid bruit 11/13/2019   Dental crowns present    History of diverticulitis    Hypertension    states under control with med., has been on med. x 5 yr.   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   Past Surgical History:  Procedure Laterality Date   ABDOMINAL HYSTERECTOMY     partial   BREAST LUMPECTOMY Right 06/22/2017   Malignant   BREAST LUMPECTOMY WITH RADIOACTIVE SEED AND SENTINEL LYMPH NODE BIOPSY Right 06/22/2017   Procedure: RIGHT BREAST LUMPECTOMY WITH RADIOACTIVE SEED AND RIGHT AXILLARY DEEP SENTINEL LYMPH NODE BIOPSY WITH BLUE DYE INJECTION;  Surgeon: Fanny Skates, MD;  Location: [place];  Service: General;  Laterality: Right;   BREAST LUMPECTOMY WITH RADIOACTIVE SEED LOCALIZATION Left 06/22/2017   Procedure: LEFT BREAST LUMPECTOMY WITH RADIOACTIVE SEED LOCALIZATION;  Surgeon: Fanny Skates, MD;  Location: [place];  Service: General;  Laterality: Left;   CATARACT EXTRACTION W/ INTRAOCULAR LENS  IMPLANT, BILATERAL Bilateral    EXCISION OF BREAST BIOPSY Left 06/22/2017   benign   LOOP RECORDER INSERTION N/A 05/16/2022   Procedure: LOOP RECORDER INSERTION;  Surgeon: Vickie Epley, MD;  Location: [facility];  Service: Cardiovascular;  Laterality: N/A;   TONSILLECTOMY     age 56   Social History   Socioeconomic History   Marital status: Married    Spouse name: Not on file   Number of children: 0   Years of education: college   Highest education level: Not on file  Occupational History   Occupation: Retried Product manager: [employer]  Tobacco Use   Smoking status: Never   Smokeless tobacco: Never  Vaping Use   Vaping  Use: Never used  Substance and Sexual Activity   Alcohol use: No    Alcohol/week: 0.0 standard drinks of alcohol   Drug use: No   Sexual activity: Never    Birth control/protection: Surgical    Comment: hyst  Other Topics Concern   Not on file  Social History Narrative   Patient lives at home with husband  ([name]).    Patient is rtired caoll   Right handed.   Caffeine 8oz . Caffeine daily   Social Determinants of Health   Financial  Resource Strain: Not on file  Food Insecurity: No Food Insecurity (06/21/2022)   Hunger Vital Sign    Worried About Running Out of Food in the Last Year: Never true    Ran Out of Food in the Last Year: Never true  Transportation Needs: No Transportation Needs (06/21/2022)   PRAPARE - Hydrologist (Medical): No    Lack of Transportation (Non-Medical): No  Physical Activity: Not on file  Stress: Not on file  Social Connections: Not on file   Family History  Problem Relation Age of Onset   Hypertension Sister    Dementia Father    Stroke Mother    Stroke Maternal Grandmother    Stroke Maternal Grandfather    Stroke Paternal Grandmother    Stroke Paternal Grandfather    Stroke Sister    Stroke Brother    Dementia Brother    Heart attack Brother    Allergies  Allergen Reactions   Atenolol Other (See Comments)    CHEST PAIN   Bisoprolol Other (See Comments)    "FEELS WEIRD"   Prednisone Other (See Comments)    UNABLE TO FOCUS   Amoxicillin     unknown   Amoxicillin-Pot Clavulanate Diarrhea and Other (See Comments)   Hctz [Hydrochlorothiazide]     Hyponatremia    Rosuvastatin     myalgias   Latex Rash   Prior to Admission medications   Medication Sig Start Date End Date Taking? Authorizing Provider  amLODipine (NORVASC) 5 MG tablet Take 5 mg by mouth daily.   Yes [provider]  aspirin EC 81 MG tablet Take 1 tablet (81 mg total) by mouth daily. Swallow whole. 05/18/22  Yes August Albino, NP  calcium-vitamin D Darron Doom WITH D) 500-5 MG-MCG tablet Take 1 tablet by mouth daily with breakfast. 05/18/22  Yes August Albino, NP  cetirizine (ZYRTEC) 10 MG tablet Take 10 mg by mouth daily.   Yes [provider]  Cholecalciferol (VITAMIN D) 50 MCG (2000 UT) tablet Take 2,000 Units by mouth daily.   Yes [provider]  clobetasol ointment (TEMOVATE) 0.05 % Apply topically 2 (two) times daily as needed (itching). Patient taking  differently: Apply 1 Application topically 2 (two) times daily as needed (itching). 05/17/22  Yes August Albino, NP  clopidogrel (PLAVIX) 75 MG tablet Take 1 tablet (75 mg total) by mouth daily. 05/18/22  Yes August Albino, NP  cyanocobalamin 1000 MCG tablet Take 1 tablet (1,000 mcg total) by mouth daily at 12 noon. Patient taking differently: Take 2,000 mcg by mouth daily at 12 noon. 05/17/22  Yes August Albino, NP  fluticasone (FLONASE) 50 MCG/ACT nasal spray Place 1 spray into both nostrils daily as needed for allergies or rhinitis.   Yes [provider]  hydrOXYzine (ATARAX) 25 MG tablet Take 25 mg by mouth 3 (three) times daily as needed for nausea or vomiting.   Yes  [provider]  levothyroxine (SYNTHROID) 25 MCG tablet Take 1 tablet (25 mcg total) by mouth daily at 6 (six) AM. 05/18/22  Yes August Albino, NP  lisinopril (ZESTRIL) 40 MG tablet Take 40 mg by mouth daily.   Yes [provider]  meclizine (ANTIVERT) 25 MG tablet Take 25 mg by mouth daily as needed for dizziness.   Yes [provider]  metoprolol tartrate (LOPRESSOR) 25 MG tablet Take 25 mg by mouth every evening.   Yes [provider]  Multiple Vitamins-Minerals (CENTRUM SILVER 50+WOMEN PO) Take 1 tablet by mouth daily.   Yes [provider]  pantoprazole (PROTONIX) 40 MG tablet Take 1 tablet (40 mg total) by mouth daily at 12 noon. 05/17/22  Yes August Albino, NP  pravastatin (PRAVACHOL) 40 MG tablet Take 1 tablet (40 mg total) by mouth daily. 05/18/22  Yes August Albino, NP  B Complex-C (B-COMPLEX WITH VITAMIN C) tablet Take 1 tablet by mouth daily. Patient not taking: Reported on 06/21/2022 05/18/22   August Albino, NP   CT Hip Right Wo Contrast  Result Date: 06/21/2022 CLINICAL DATA:  Hip trauma, fracture suspected, xray done EXAM: CT OF THE RIGHT HIP WITHOUT CONTRAST TECHNIQUE: Multidetector CT imaging of the right hip was performed according to the standard  protocol. Multiplanar CT image reconstructions were also generated. RADIATION DOSE REDUCTION: This exam was performed according to the departmental dose-optimization program which includes automated exposure control, adjustment of the mA and/or kV according to patient size and/or use of iterative reconstruction technique. COMPARISON:  X-ray 06/21/2022, MRI 06/03/2020 FINDINGS: Bones/Joint/Cartilage Acute transcervical right femoral neck fracture with minimal impaction (series 6, image 27). No fracture extension to the femoral head or into the intertrochanteric region. Normal hip joint alignment without dislocation. No appreciable joint effusion. Imaged right hemipelvis intact without fracture or diastasis. Previously described lesion of the right greater trochanter is not less conspicuous on the current exam (series 2, image 63), likely benign. No suspicious lytic or sclerotic bone lesion is seen. Ligaments Suboptimally assessed by CT. Muscles and Tendons No acute musculotendinous abnormality by CT. Soft tissues Soft tissue swelling with small ill-defined hematomas in the subcutaneous soft tissues at the lateral aspect of the right hip and proximal thigh. No right inguinal lymphadenopathy. Scattered vascular calcification. IMPRESSION: 1. Acute transcervical right femoral neck fracture with minimal impaction. 2. Soft tissue swelling with small ill-defined hematomas in the subcutaneous soft tissues at the lateral aspect of the right hip and proximal thigh. Electronically Signed   By: Davina Poke D.O.   On: 06/21/2022 14:02   DG Chest 1 View  Result Date: 06/21/2022 CLINICAL DATA:  Acute right hip fracture.  Pain. EXAM: CHEST  1 VIEW COMPARISON:  AP chest 05/19/2020 FINDINGS: A likely cardiac loop recorder overlies the left hemithorax, new from prior. Cardiac silhouette and mediastinal contours within normal limits. Mild calcification within aortic arch. There is mild-to-moderate right and mild left apical  pleural thickening. Mild bilateral upper lung interstitial thickening/scarring. No new airspace opacity. No pleural effusion pneumothorax. No acute skeletal abnormality. Surgical clips again overlie the bilateral breasts. IMPRESSION: No acute cardiopulmonary disease process. Electronically Signed   By: Yvonne Kendall M.D.   On: 06/21/2022 13:11   DG Shoulder Right  Result Date: 06/21/2022 CLINICAL DATA:  Right shoulder pain after fall. EXAM: RIGHT SHOULDER - 2+ VIEW COMPARISON:  None Available. FINDINGS: The glenohumeral and acromioclavicular joint spaces are normally aligned. There is diffuse decreased bone mineralization. No acute  fracture line is seen. No dislocation. The visualized portion of the right lung is unremarkable. IMPRESSION: No acute fracture is seen. Electronically Signed   By: Yvonne Kendall M.D.   On: 06/21/2022 13:07   DG Hip Unilat W or Wo Pelvis 2-3 Views Right  Result Date: 06/21/2022 CLINICAL DATA:  Hip pain after fall. EXAM: DG HIP (WITH OR WITHOUT PELVIS) 2-3V RIGHT COMPARISON:  Pelvis and right hip radiograph 05/19/2020; MRI right hip 06/03/2020 FINDINGS: There is a new linear lucency within the medial aspect of the right femoral neck. Minimal bilateral femoroacetabular joint space narrowing. Mild bilateral sacroiliac subchondral sclerosis. The previously described centrally lucent and peripherally sclerotic focus within the lateral aspect of the right greater trochanter is less well visualized compared to 05/19/2020 radiograph. No definite suspicious bone lesion is seen. IMPRESSION: Nondisplaced acute medial right femoral neck fracture. Electronically Signed   By: Yvonne Kendall M.D.   On: 06/21/2022 13:06   Family History Reviewed and non-contributory, no pertinent history of problems with bleeding or anesthesia      Review of Systems 14 system ROS conducted and negative except for that noted in HPI   OBJECTIVE  Vitals:Patient Vitals for the past 8 hrs:  BP Temp Temp src  Pulse Resp SpO2 Height Weight  06/22/22 0520 131/67 97.8 F (36.6 C) -- 60 14 98 % -- --  06/22/22 0132 138/77 98 F (36.7 C) -- 69 14 97 % -- --  06/22/22 0050 -- -- -- -- -- -- '5\' 2"'  (1.575 m) 49.4 kg  06/21/22 2356 138/60 97.8 F (36.6 C) Oral 67 14 98 % -- --   General: Alert, no acute distress Cardiovascular: Warm extremities noted Respiratory: No cyanosis, no use of accessory musculature GI: No organomegaly, abdomen is soft and non-tender Skin: No lesions in the area of chief complaint other than those listed below in MSK exam.  Neurologic: Sensation intact distally save for the below mentioned MSK exam Psychiatric: Patient is competent for consent with normal mood and affect Lymphatic: No swelling obvious and reported other than the area involved in the exam below  Extremities  UGQ:[id number] function of right upper extremity due to recent stroke, actively moves RUE without pain, forward elevation to about 90 degrees [id number] moves left upper extremity without pain, nontender to palpation throughout, NVI ZPH:[id number] and externally rotated.  ROM deferred. + GS/TA/EHL. Sensation intact in DP/SP/S/S/P distributions. 2+ DP pulse with warm and well perfused digits. Compartments soft and compressible, with no pain on passive stretch. LLE: actively moves left lower extremity without pain, nontender to palpation throughout, NVI    Test Results Imaging CT right hip demonstrates acute transcervical right femoral neck fracture with minimal impaction.  Labs cbc Recent Labs    06/21/22 1231 06/22/22 0412  WBC 5.5 8.6  HGB 12.2 11.0*  HCT 35.8* 32.1*  PLT 435* 406*    Labs inflam No results for input(s): "CRP" in the last 72 hours.  Invalid input(s): "ESR"  Labs coag No results for input(s): "INR", "PTT" in the last 72 hours.  Invalid input(s): "PT"  Recent Labs    06/21/22 1231 06/21/22 1815 06/22/22 0412  NA 141  --  138  K 2.4* 2.9* 3.4*  CL 110  --  112*  CO2  22  --  21*  GLUCOSE 106*  --  153*  BUN 12  --  13  CREATININE 0.76  --  0.71  CALCIUM 9.2  --  9.1     ASSESSMENT AND  PLAN: 71 y.o. female with the following: right femoral neck fracture   Orthopedics recommends admission to a medical service and we will provide consultation and follow along.  Discussed the nature of the injury as well as the care with the patient as well as the family.  Discussed options and non-operative versus operative measures. Nonoperative measures are not well tolerated as patient's on bedrest for extended periods of time tend to develop secondary issues such as pneumonia, urinary tract infections, bedsores and delirium.  Based on this our recommendation is for operative measures.  Understanding this the patient/family elected to proceed with operative measures.  The risks and benefits of  surgical intervention including infection, bleeding, nerve injury, periprosthetic fracture, the need for revision surgery, leg length discrepancy, gait change, blood clots, cardiopulmonary complications, morbidity, mortality, among others, and they were willing to proceed.    - Plan: Operative fixation this afternoon - KEEP NPO -Medicine team to admit and perform pre-op clearance - Weight Bearing Status/Activity: will ammend WB status postop, bedrest for now - PT/OT post op - VTE Prophylaxis: SCDs for now - Pain control: PRN pain medications - Dispo: Likely to require Rehab or SNF placement upon discharge.  - Contact information: Dr. Ophelia Charter, Noemi Chapel, PA-C  [place], [place] 06/22/2022 7:47 AM

## 2022-06-22 NOTE — Discharge Instructions (Addendum)
Ophelia Charter MD, MPH Noemi Chapel, PA-C [place] [address], [address] [phone] (tel)   [phone] (fax)   [place] - Please keep dressing intact until followup.  - You may shower on Post-Op Day #2.  - The dressing is water resistant but do not scrub it as it may start to peel up.   - Gently pat the area dry.  - Do not soak the shoulder in water. Do not go swimming in the pool or ocean until your sutures are removed. - KEEP THE INCISIONS CLEAN AND DRY.  EXERCISES - Follow the instructions of your therapist.  No specific exercises necessary outside of this. - You may bear weight on your operative leg. - Please continue to ambulate and do not stay sitting or lying for too long. Perform foot and wrist pumps to assist in circulation.  POST-OP MEDICATIONS- Multimodal approach to pain control In general your pain will be controlled with a combination of substances.  Prescriptions unless otherwise discussed are electronically sent to your pharmacy.  This is a carefully made plan we use to minimize narcotic use.     Hydrocodone-acetaminophen (Norco) - This is a strong narcotic, to be used only on an "as needed" basis for SEVERE pain.  FOLLOW-UP - If you develop a Fever (>101.5), Redness or Drainage from the surgical incision site, please call our office to arrange for an evaluation. - Please call the office to schedule a follow-up appointment for your incision check, 10-14 days post-operatively.  IF YOU HAVE ANY QUESTIONS, PLEASE FEEL FREE TO CALL OUR OFFICE.  HELPFUL INFORMATION  You should wean off your narcotic medicines as soon as you are able.  Most patients will be off or using minimal narcotics before their first postop appointment.   We suggest you use the pain medication the first night prior to going to bed, in order to ease any pain when the anesthesia wears off. You should avoid taking pain medications  on an empty stomach as it will make you nauseous.  Do not drink alcoholic beverages or take illicit drugs when taking pain medications.  Pain medication may make you constipated.  Below are a few solutions to try in this order: Decrease the amount of pain medication if you aren't having pain. Drink lots of decaffeinated fluids. Drink prune juice and/or each dried prunes  If the first 3 don't work start with additional solutions Take Colace - an over-the-counter stool softener Take Senokot - an over-the-counter laxative Take Miralax - a stronger over-the-counter laxative  For more information including helpful videos and documents visit our website:   [email or web address]      High-Calorie, High-Protein Nutrition Therapy (2021) A high-calorie, high-protein diet has been recommended to you. Your registered dietitian nutritionist (RDN) may have recommended this diet because you are having difficulty eating enough calories throughout the day, you have lost weight, and/or you need to add protein to your diet. Sometimes you may not feel like eating, even if you know the importance of good nutrition. The recommendations in this handout can help you with the following: Regaining your strength and energy Keeping your body healthy Healing and recovering from surgery or illness and fighting infection Tips: Schedule Your Meals and Snacks Several small meals and snacks are often better tolerated and digested than large meals. Strategies Plan to eat 3 meals and 3 snacks daily. Experiment with timing meals to find out when you have a larger appetite. Appetite may  be greatest in the morning after not eating all night so you may prefer to eat your larger meals and snacks in the morning and at lunch. Breakfast-type foods are often better tolerated so eat foods such as eggs, pancakes, waffles and cereal for any meal or snack. Carry snacks with you so you are prepared  to eat every 2 to 3 hours. Determine what works best for you if your body's cues for feeling hungry or full are not working. Eat a small meal or snack even if you don't feel hungry. Set a timer to remind you when it is time to eat. Take a walk before you eat (with health care provider's approval). Light or moderate physical activity can help you maintain muscle and increase your appetite. Make Eating Enjoyable Taking steps to make the experience enjoyable may help to increase your interest in eating and improve your appetite. Strategies: Eat with others whenever possible. Include your favorite foods to make meals more enjoyable. Try new foods. Save your beverage for the end of the meal so that you have more room for food before you get full. Add Calories to Your Meals and Snacks Try adding calorie-dense foods so that each bite provides more nutrition. Strategies Drink milk, chocolate milk, soy milk, or smoothies instead of low-calorie beverages such as diet drinks or water. Cook with milk or soy milk instead of water when making dishes such as hot cereal, cocoa, or pudding. Add jelly, jam, honey, butter or margarine to bread and crackers. Add jam or fruit to ice cream and as a topping over cake. Mix dried fruit, nuts, granola, honey, or dry cereal with yogurt or hot cereals. Enjoy snacks such as milkshakes, smoothies, pudding, ice cream, or custard. Blend a fruit smoothie of a banana, frozen berries, milk or soy milk, and 1 tablespoon nonfat powdered milk or protein powder. Add Protein to Your Meals and Snacks Choose at least one protein food at each meal and snack to increase your daily intake. Strategies Add  cup nonfat dry milk powder or protein powder to make a high-protein milk to drink or to use in recipes that call for milk. Vanilla or peppermint extract or unsweetened cocoa powder could help to boost the flavor. Add hard-cooked eggs, leftover meat, grated cheese, canned beans or  tofu to noodles, rice, salads, sandwiches, soups, casseroles, pasta, tuna and other mixed dishes. Add powdered milk or protein powder to hot cereals, meatloaf, casseroles, scrambled eggs, sauces, cream soups, and shakes. Add beans and lentils to salads, soups, casseroles, and vegetable dishes. Eat cottage cheese or yogurt, especially Greek yogurt, with fruit as a snack or dessert. Eat peanut or other nut butters on crackers, bread, toast, waffles, apples, bananas or celery sticks. Add it to milkshakes, smoothies, or desserts. Consider a ready-made protein shake. Your RDN will make recommendations. Add Fats to Your Meals and Snacks Try adding fats to your meals and snacks. Fat provides more calories in fewer bites than carbohydrate or protein and adds flavors to your foods. Strategies Snack on nuts and seeds or add them to foods like salads, pasta, cereals, yogurt, and ice cream.  Saut or stir-fry vegetables, meats, chicken, fish or tofu in olive or canola oil.  Add olive oil, other vegetable oils, butter or margarine to soups, vegetables, potatoes, cooked cereal, rice, pasta, bread, crackers, pancakes, or waffles. Snack on olives or add to pasta, pizza, or salad. Add avocado or guacamole to your salads, sandwiches, and other entrees. Include fatty fish such as  salmon in your weekly meal plan. For general food safety tips, especially for clients with immunocompromised conditions, ask your RDN for the Food Safety Nutrition Therapy handout. Small Meal and Snack Ideas These snacks and meals are recommended when you have to eat but aren't necessarily hungry.  They are good choices because they are high in protein and high in calories.  2 graham crackers 2 tablespoons peanut or other nut butter 1 cup milk 2 slices whole wheat toast topped with:  avocado, mashed Seasoning of your choice   cup Greek yogurt  cup fruit  cup granola 2 deviled egg halves 5 whole wheat crackers  1 cup cream of  tomato soup  grilled cheese sandwich 1 toasted waffle topped with: 2 tablespoons peanut or nut butter 1 tablespoon jam  Trail mix made with:  cup nuts  cup dried fruit  cup cold cereal, any variety  cup oatmeal or cream of wheat cereal 1 tablespoon peanut or nut butter  cup diced fruit   High-Calorie, High-Protein Sample 1-Day Menu View Nutrient Info Breakfast 1 egg, scrambled 1 ounce cheddar cheese 1 English muffin, whole wheat 1 tablespoon margarine 1 tablespoon jam  cup orange juice, fortified with calcium and vitamin D  Morning Snack 1 tablespoon peanut butter 1 banana 1 cup 1% milk  Lunch Tuna salad sandwich made with: 2 slices bread, whole wheat 3 ounces tuna mixed with: 1 tablespoon mayonnaise  cup pudding  Afternoon Snack  cup hummus  cup carrots 1 pita  Evening Meal Enchilada casserole made with: 2 corn tortillas 3 ounces ground beef, cooked  cup black beans, cooked  cup corn, cooked 1 ounce grated cheddar cheese  cup enchilada sauce  avocado, sliced, topping for enchilada 1 tablespoon sour cream, topping for enchilada Salad:  cup lettuce, shredded  cup tomatoes, chopped, for salad 1 tablespoon olive oil and vinegar dressing, for salad  Evening Snack  cup Greek yogurt  cup blueberries  cup granola

## 2022-06-22 NOTE — Anesthesia Procedure Notes (Signed)
Procedure Name: Intubation Date/Time: 06/22/2022 12:46 PM  Performed by: Gean Maidens, CRNAPre-anesthesia Checklist: Emergency Drugs available, Patient identified, Suction available, Patient being monitored and Timeout performed Patient Re-evaluated:Patient Re-evaluated prior to induction Oxygen Delivery Method: Circle system utilized Preoxygenation: Pre-oxygenation with 100% oxygen Induction Type: IV induction Ventilation: Mask ventilation without difficulty Laryngoscope Size: Mac and 3 Grade View: Grade III Tube type: Oral Tube size: 7.0 mm Number of attempts: 1 Airway Equipment and Method: Stylet Placement Confirmation: ETT inserted through vocal cords under direct vision, positive ETCO2 and breath sounds checked- equal and bilateral Secured at: 21 cm Tube secured with: Tape Dental Injury: Teeth and Oropharynx as per pre-operative assessment  Difficulty Due To: Difficulty was unanticipated Comments: Recommend Glidescope

## 2022-06-22 NOTE — Progress Notes (Signed)
Initial Nutrition Assessment  DOCUMENTATION CODES:   Non-severe (moderate) malnutrition in context of chronic illness  INTERVENTION:   -Ensure Plus High Protein po BID, each supplement provides 350 kcal and 20 grams of protein.   -Multivitamin with minerals daily  -Provided "High Calorie, High Protein" handout in AVS  -Check vitamin labs: iron, Vitamin B-12, folate, Vitamin D given pt's report of poor PO intake, hair loss, dysgeusia and increased bruising prior to fall. If WNL, may need to check zinc level as well.  NUTRITION DIAGNOSIS:   Moderate Malnutrition related to chronic illness (h/o CVA) as evidenced by mild fat depletion, mild muscle depletion, energy intake < or equal to 75% for > or equal to 1 month, percent weight loss.  GOAL:   Patient will meet greater than or equal to 90% of their needs  MONITOR:   PO intake, Supplement acceptance, Labs, Weight trends, Skin, I & O's  REASON FOR ASSESSMENT:   Consult Hip fracture protocol  ASSESSMENT:   71 y.o. female with medical history significant of typical chest pain right breast cancer, carotid bruit, diverticulosis/diverticulitis, hypertension, premature atrial contractions, PVCs, bigeminy, seasonal allergies who was recently discharged from the hospital due to left pain large subcortical infarct who is coming to the emergency department due to having a fall at home. Admitted for right hip fracture.  Patient in room with husband at bedside. Pt states that since her stroke on 8/11, she has had no appetite and is barely eating. Husband bought her Boosts to drink and she is consuming at most 1/2 bottle daily. Pt takes daily Centrum MVI. Pt states she has noticed hair loss, excessive bruising, and metallic taste with anything she eats since her stroke.  Will check anemia labs and Vitamin D as well. May need zinc level checked if these return WNL.   Pt reports UBW is ~121 lbs. Current weight: 108 lbs. Pt has had a 10% weight  loss x 1.5 months, significant for time frame.  Medications: KLOR-CON  Labs reviewed: Low K   NUTRITION - FOCUSED PHYSICAL EXAM:  Flowsheet Row Most Recent Value  Orbital Region Mild depletion  Upper Arm Region Moderate depletion  [rt sided weakness r/t CVA]  Thoracic and Lumbar Region Mild depletion  Buccal Region Mild depletion  Temple Region Mild depletion  Clavicle Bone Region Mild depletion  Clavicle and Acromion Bone Region Mild depletion  Scapular Bone Region Mild depletion  Dorsal Hand Moderate depletion  Patellar Region Mild depletion  Anterior Thigh Region Mild depletion  Posterior Calf Region Mild depletion  Edema (RD Assessment) None  Hair Reviewed  [+hair loss]  Eyes Reviewed  Mouth Reviewed  Rebel.Morris mouth]  Skin Reviewed  [pale]  Nails Reviewed       Diet Order:   Diet Order             Diet NPO time specified  Diet effective now                   EDUCATION NEEDS:   Education needs have been addressed  Skin:  Skin Assessment: Reviewed RN Assessment  Last BM:  9/20  Height:   Ht Readings from Last 1 Encounters:  06/22/22 '5\' 2"'$  (1.575 m)    Weight:   Wt Readings from Last 1 Encounters:  06/22/22 49.4 kg    BMI:  Body mass index is 19.92 kg/m.  Estimated Nutritional Needs:   Kcal:  1450-1650  Protein:  70-85g  Fluid:  1.6L/day  Clayton Bibles, MS,  RD, LDN Inpatient Clinical Dietitian Contact information available via Amion

## 2022-06-22 NOTE — H&P (View-Only) (Signed)
ORTHOPAEDIC CONSULTATION  REQUESTING PHYSICIAN: Hosie Poisson, MD  Chief Complaint: right hip pain  HPI: Angela Buchanan is a 71 y.o. female with  medical history significant of atypical chest pain, right breast cancer, carotid bruit, diverticulosis/diverticulitis, hypertension, premature atrial contractions, PVCs, bigeminy, seasonal allergies who was recently discharged from the hospital due to left pain large subcortical infarct who is coming to the emergency department due to having a fall at home. She landed on her right hip. She had immediate pain and inability to weight bear. She was taken to [facility] Emergency Department. Work-up in the ED was significant for right femoral neck fracture. Orthopedics was consulted.  Patient notes she is still having pain in the right hip. It is improved with medications. She normally ambulates with a walker since her stroke. This affected her right side. She states her right lower leg was able to rehab quicker than her right arm. She still has limited function of the right arm. She denies any pain besides the right hip. She lives at home with her husband. She was recently discharged from [facility]. She believes she was discharged too soon. Once she was at home they had difficulty getting a home health nurse to come to the house. She is very frustrated by this. No previous orthopedic issues.   Past Medical History:  Diagnosis Date   Atypical chest pain 07/19/2021   Bigeminy    no current med.   Breast cancer ([place]) 06/2017   right   Bruises easily    Carotid bruit 11/13/2019   Dental crowns present    History of diverticulitis    Hypertension    states under control with med., has been on med. x 5 yr.   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   Past Surgical History:  Procedure Laterality Date   ABDOMINAL HYSTERECTOMY     partial   BREAST LUMPECTOMY Right 06/22/2017   Malignant   BREAST LUMPECTOMY WITH RADIOACTIVE SEED AND SENTINEL LYMPH NODE BIOPSY Right 06/22/2017   Procedure: RIGHT BREAST LUMPECTOMY WITH RADIOACTIVE SEED AND RIGHT AXILLARY DEEP SENTINEL LYMPH NODE BIOPSY WITH BLUE DYE INJECTION;  Surgeon: Fanny Skates, MD;  Location: [place];  Service: General;  Laterality: Right;   BREAST LUMPECTOMY WITH RADIOACTIVE SEED LOCALIZATION Left 06/22/2017   Procedure: LEFT BREAST LUMPECTOMY WITH RADIOACTIVE SEED LOCALIZATION;  Surgeon: Fanny Skates, MD;  Location: [place];  Service: General;  Laterality: Left;   CATARACT EXTRACTION W/ INTRAOCULAR LENS  IMPLANT, BILATERAL Bilateral    EXCISION OF BREAST BIOPSY Left 06/22/2017   benign   LOOP RECORDER INSERTION N/A 05/16/2022   Procedure: LOOP RECORDER INSERTION;  Surgeon: Vickie Epley, MD;  Location: [place] [facility];  Service: Cardiovascular;  Laterality: N/A;   TONSILLECTOMY     age 71   Social History   Socioeconomic History   Marital status: Married    Spouse name: Not on file   Number of children: 0   Years of education: college   Highest education level: Not on file  Occupational History   Occupation: Retried Product manager: [employer]  Tobacco Use   Smoking status: Never   Smokeless tobacco: Never  Vaping Use   Vaping  Use: Never used  Substance and Sexual Activity   Alcohol use: No    Alcohol/week: 0.0 standard drinks of alcohol   Drug use: No   Sexual activity: Never    Birth control/protection: Surgical    Comment: hyst  Other Topics Concern   Not on file  Social History Narrative   Patient lives at home with husband  ([name]).    Patient is rtired caoll   Right handed.   Caffeine 8oz . Caffeine daily   Social Determinants of Health   Financial  Resource Strain: Not on file  Food Insecurity: No Food Insecurity (06/21/2022)   Hunger Vital Sign    Worried About Running Out of Food in the Last Year: Never true    Ran Out of Food in the Last Year: Never true  Transportation Needs: No Transportation Needs (06/21/2022)   PRAPARE - Hydrologist (Medical): No    Lack of Transportation (Non-Medical): No  Physical Activity: Not on file  Stress: Not on file  Social Connections: Not on file   Family History  Problem Relation Age of Onset   Hypertension Sister    Dementia Father    Stroke Mother    Stroke Maternal Grandmother    Stroke Maternal Grandfather    Stroke Paternal Grandmother    Stroke Paternal Grandfather    Stroke Sister    Stroke Brother    Dementia Brother    Heart attack Brother    Allergies  Allergen Reactions   Atenolol Other (See Comments)    CHEST PAIN   Bisoprolol Other (See Comments)    "FEELS WEIRD"   Prednisone Other (See Comments)    UNABLE TO FOCUS   Amoxicillin     unknown   Amoxicillin-Pot Clavulanate Diarrhea and Other (See Comments)   Hctz [Hydrochlorothiazide]     Hyponatremia    Rosuvastatin     myalgias   Latex Rash   Prior to Admission medications   Medication Sig Start Date End Date Taking? Authorizing Provider  amLODipine (NORVASC) 5 MG tablet Take 5 mg by mouth daily.   Yes [provider]  aspirin EC 81 MG tablet Take 1 tablet (81 mg total) by mouth daily. Swallow whole. 05/18/22  Yes August Albino, NP  calcium-vitamin D Darron Doom WITH D) 500-5 MG-MCG tablet Take 1 tablet by mouth daily with breakfast. 05/18/22  Yes August Albino, NP  cetirizine (ZYRTEC) 10 MG tablet Take 10 mg by mouth daily.   Yes [provider]  Cholecalciferol (VITAMIN D) 50 MCG (2000 UT) tablet Take 2,000 Units by mouth daily.   Yes [provider]  clobetasol ointment (TEMOVATE) 0.05 % Apply topically 2 (two) times daily as needed (itching). Patient taking  differently: Apply 1 Application topically 2 (two) times daily as needed (itching). 05/17/22  Yes August Albino, NP  clopidogrel (PLAVIX) 75 MG tablet Take 1 tablet (75 mg total) by mouth daily. 05/18/22  Yes August Albino, NP  cyanocobalamin 1000 MCG tablet Take 1 tablet (1,000 mcg total) by mouth daily at 12 noon. Patient taking differently: Take 2,000 mcg by mouth daily at 12 noon. 05/17/22  Yes August Albino, NP  fluticasone (FLONASE) 50 MCG/ACT nasal spray Place 1 spray into both nostrils daily as needed for allergies or rhinitis.   Yes [provider]  hydrOXYzine (ATARAX) 25 MG tablet Take 25 mg by mouth 3 (three) times daily as needed for nausea or vomiting.   Yes  [provider]  levothyroxine (SYNTHROID) 25 MCG tablet Take 1 tablet (25 mcg total) by mouth daily at 6 (six) AM. 05/18/22  Yes August Albino, NP  lisinopril (ZESTRIL) 40 MG tablet Take 40 mg by mouth daily.   Yes [provider]  meclizine (ANTIVERT) 25 MG tablet Take 25 mg by mouth daily as needed for dizziness.   Yes [provider]  metoprolol tartrate (LOPRESSOR) 25 MG tablet Take 25 mg by mouth every evening.   Yes [provider]  Multiple Vitamins-Minerals (CENTRUM SILVER 50+WOMEN PO) Take 1 tablet by mouth daily.   Yes [provider]  pantoprazole (PROTONIX) 40 MG tablet Take 1 tablet (40 mg total) by mouth daily at 12 noon. 05/17/22  Yes August Albino, NP  pravastatin (PRAVACHOL) 40 MG tablet Take 1 tablet (40 mg total) by mouth daily. 05/18/22  Yes August Albino, NP  B Complex-C (B-COMPLEX WITH VITAMIN C) tablet Take 1 tablet by mouth daily. Patient not taking: Reported on 06/21/2022 05/18/22   August Albino, NP   CT Hip Right Wo Contrast  Result Date: 06/21/2022 CLINICAL DATA:  Hip trauma, fracture suspected, xray done EXAM: CT OF THE RIGHT HIP WITHOUT CONTRAST TECHNIQUE: Multidetector CT imaging of the right hip was performed according to the standard  protocol. Multiplanar CT image reconstructions were also generated. RADIATION DOSE REDUCTION: This exam was performed according to the departmental dose-optimization program which includes automated exposure control, adjustment of the mA and/or kV according to patient size and/or use of iterative reconstruction technique. COMPARISON:  X-ray 06/21/2022, MRI 06/03/2020 FINDINGS: Bones/Joint/Cartilage Acute transcervical right femoral neck fracture with minimal impaction (series 6, image 27). No fracture extension to the femoral head or into the intertrochanteric region. Normal hip joint alignment without dislocation. No appreciable joint effusion. Imaged right hemipelvis intact without fracture or diastasis. Previously described lesion of the right greater trochanter is not less conspicuous on the current exam (series 2, image 63), likely benign. No suspicious lytic or sclerotic bone lesion is seen. Ligaments Suboptimally assessed by CT. Muscles and Tendons No acute musculotendinous abnormality by CT. Soft tissues Soft tissue swelling with small ill-defined hematomas in the subcutaneous soft tissues at the lateral aspect of the right hip and proximal thigh. No right inguinal lymphadenopathy. Scattered vascular calcification. IMPRESSION: 1. Acute transcervical right femoral neck fracture with minimal impaction. 2. Soft tissue swelling with small ill-defined hematomas in the subcutaneous soft tissues at the lateral aspect of the right hip and proximal thigh. Electronically Signed   By: Davina Poke D.O.   On: 06/21/2022 14:02   DG Chest 1 View  Result Date: 06/21/2022 CLINICAL DATA:  Acute right hip fracture.  Pain. EXAM: CHEST  1 VIEW COMPARISON:  AP chest 05/19/2020 FINDINGS: A likely cardiac loop recorder overlies the left hemithorax, new from prior. Cardiac silhouette and mediastinal contours within normal limits. Mild calcification within aortic arch. There is mild-to-moderate right and mild left apical  pleural thickening. Mild bilateral upper lung interstitial thickening/scarring. No new airspace opacity. No pleural effusion pneumothorax. No acute skeletal abnormality. Surgical clips again overlie the bilateral breasts. IMPRESSION: No acute cardiopulmonary disease process. Electronically Signed   By: Yvonne Kendall M.D.   On: 06/21/2022 13:11   DG Shoulder Right  Result Date: 06/21/2022 CLINICAL DATA:  Right shoulder pain after fall. EXAM: RIGHT SHOULDER - 2+ VIEW COMPARISON:  None Available. FINDINGS: The glenohumeral and acromioclavicular joint spaces are normally aligned. There is diffuse decreased bone mineralization. No acute  fracture line is seen. No dislocation. The visualized portion of the right lung is unremarkable. IMPRESSION: No acute fracture is seen. Electronically Signed   By: Yvonne Kendall M.D.   On: 06/21/2022 13:07   DG Hip Unilat W or Wo Pelvis 2-3 Views Right  Result Date: 06/21/2022 CLINICAL DATA:  Hip pain after fall. EXAM: DG HIP (WITH OR WITHOUT PELVIS) 2-3V RIGHT COMPARISON:  Pelvis and right hip radiograph 05/19/2020; MRI right hip 06/03/2020 FINDINGS: There is a new linear lucency within the medial aspect of the right femoral neck. Minimal bilateral femoroacetabular joint space narrowing. Mild bilateral sacroiliac subchondral sclerosis. The previously described centrally lucent and peripherally sclerotic focus within the lateral aspect of the right greater trochanter is less well visualized compared to 05/19/2020 radiograph. No definite suspicious bone lesion is seen. IMPRESSION: Nondisplaced acute medial right femoral neck fracture. Electronically Signed   By: Yvonne Kendall M.D.   On: 06/21/2022 13:06   Family History Reviewed and non-contributory, no pertinent history of problems with bleeding or anesthesia      Review of Systems 14 system ROS conducted and negative except for that noted in HPI   OBJECTIVE  Vitals:Patient Vitals for the past 8 hrs:  BP Temp Temp src  Pulse Resp SpO2 Height Weight  06/22/22 0520 131/67 97.8 F (36.6 C) -- 60 14 98 % -- --  06/22/22 0132 138/77 98 F (36.7 C) -- 69 14 97 % -- --  06/22/22 0050 -- -- -- -- -- -- '5\' 2"'  (1.575 m) 49.4 kg  06/21/22 2356 138/60 97.8 F (36.6 C) Oral 67 14 98 % -- --   General: Alert, no acute distress Cardiovascular: Warm extremities noted Respiratory: No cyanosis, no use of accessory musculature GI: No organomegaly, abdomen is soft and non-tender Skin: No lesions in the area of chief complaint other than those listed below in MSK exam.  Neurologic: Sensation intact distally save for the below mentioned MSK exam Psychiatric: Patient is competent for consent with normal mood and affect Lymphatic: No swelling obvious and reported other than the area involved in the exam below  Extremities  EXH:BZJIRCV function of right upper extremity due to recent stroke, actively moves RUE without pain, forward elevation to about 90 degrees ELF:YBOFBPZW moves left upper extremity without pain, nontender to palpation throughout, NVI CHE:NIDPOEUMP and externally rotated.  ROM deferred. + GS/TA/EHL. Sensation intact in DP/SP/S/S/P distributions. 2+ DP pulse with warm and well perfused digits. Compartments soft and compressible, with no pain on passive stretch. LLE: actively moves left lower extremity without pain, nontender to palpation throughout, NVI    Test Results Imaging CT right hip demonstrates acute transcervical right femoral neck fracture with minimal impaction.  Labs cbc Recent Labs    06/21/22 1231 06/22/22 0412  WBC 5.5 8.6  HGB 12.2 11.0*  HCT 35.8* 32.1*  PLT 435* 406*    Labs inflam No results for input(s): "CRP" in the last 72 hours.  Invalid input(s): "ESR"  Labs coag No results for input(s): "INR", "PTT" in the last 72 hours.  Invalid input(s): "PT"  Recent Labs    06/21/22 1231 06/21/22 1815 06/22/22 0412  NA 141  --  138  K 2.4* 2.9* 3.4*  CL 110  --  112*  CO2  22  --  21*  GLUCOSE 106*  --  153*  BUN 12  --  13  CREATININE 0.76  --  0.71  CALCIUM 9.2  --  9.1     ASSESSMENT AND  PLAN: 71 y.o. female with the following: right femoral neck fracture   Orthopedics recommends admission to a medical service and we will provide consultation and follow along.  Discussed the nature of the injury as well as the care with the patient as well as the family.  Discussed options and non-operative versus operative measures. Nonoperative measures are not well tolerated as patient's on bedrest for extended periods of time tend to develop secondary issues such as pneumonia, urinary tract infections, bedsores and delirium.  Based on this our recommendation is for operative measures.  Understanding this the patient/family elected to proceed with operative measures.  The risks and benefits of  surgical intervention including infection, bleeding, nerve injury, periprosthetic fracture, the need for revision surgery, leg length discrepancy, gait change, blood clots, cardiopulmonary complications, morbidity, mortality, among others, and they were willing to proceed.    - Plan: Operative fixation this afternoon - KEEP NPO -Medicine team to admit and perform pre-op clearance - Weight Bearing Status/Activity: will ammend WB status postop, bedrest for now - PT/OT post op - VTE Prophylaxis: SCDs for now - Pain control: PRN pain medications - Dispo: Likely to require Rehab or SNF placement upon discharge.  - Contact information: Dr. Ophelia Charter, Noemi Chapel, PA-C  [place], [place] 06/22/2022 7:47 AM

## 2022-06-22 NOTE — Op Note (Signed)
Orthopaedic Surgery Operative Note (CSN: [id number])  Angela Buchanan  31-Dec-1950 Date of Surgery: 06/21/2022 - 06/22/2022   Diagnoses:  Right valgus impacted femoral neck fracture  Procedure: Right hip closed reduction percutaneous pinning   Operative Finding Successful completion of the planned procedure.  Alignment was stable throughout the positioning.  Inferior screw had great purchase, posterior screw had great purchase but the anterior screw only had partial purchase.  We switched it for a fully threaded screw which was better.  Patient fails she would need a hemiarthroplasty versus total   Post-operative plan: The patient will be weightbearing to tolerance.  The patient will be admitted to the hospital service.  DVT prophylaxis Lovenox 40 mg/day until mobilizing and then consider transition in clinic to alternative medicines.   Pain control with PRN pain medication preferring oral medicines.  Follow up plan will be scheduled in approximately 7 days for incision check and XR.  Post-Op Diagnosis: Same Surgeons:Primary: Hiram Gash, MD Assistants:Caroline McBane PA-C Location: [facility] Anesthesia: General with local anesthesia Antibiotics: Ancef 2 g with local vancomycin powder 1 g at the surgical site Tourniquet time:  Estimated Blood Loss: Minimal Complications: None Specimens: None Implants: Implant Name Type Inv. Item Serial No. Manufacturer Lot No. LRB No. Used Action  WASHER FLAT 7.0 - [id number] Washer WASHER FLAT 7.0  [employer]  Right 1 Implanted  SCREW CANN 7X80 - [id number] Screw SCREW CANN 7X80  [employer]  Right 1 Implanted  7.0 cannulated screw      Right 1 Implanted  7.0 cannulated screw      Right 1 Implanted    Indications for Surgery:   Angela Buchanan is a 71 y.o. female with fall resulting in a valgus impacted right femoral neck fracture.  Benefits and risks of operative and nonoperative management were discussed prior to surgery with patient/guardian(s)  and informed consent form was completed.  Specific risks including infection, need for additional surgery, nonunion, malunion, hardware failure, avascular necrosis amongst others and need for conversion to arthroplasty   Procedure:   The patient was identified properly. Informed consent was obtained and the surgical site was marked. The patient was taken up to suite where general anesthesia was induced.  The patient was positioned supine on a radiolucent table.  The right hip was prepped and draped in the usual sterile fashion.  Timeout was performed before the beginning of the case.  We used orthogonal fluoroscopy to confirm the fracture did not displace.  At this point we used fluoro to guide percutaneous placement of 3 Arthrex 7 mm mm cannulated screws on orthogonal views.  This was performed in an inverted triangle.    We placed K wires, drilled, and then placed partially treaded screws with washers on 2 of the screws.  Purchase was reasonable with all screws.  Around the world fluoroscopy confirmed position and lack of articular involvement.     We irrigated the wound copiously before placing local antibiotic as listed abov.  We closed the incision in a multilayer fashion with absorbable suture.  Sterile dressing was placed.  Patient was awoken taken to PACU in stable condition.  Noemi Chapel, PA-C, present and scrubbed throughout the case, critical for completion in a timely fashion, and for retraction, instrumentation, closure.

## 2022-06-22 NOTE — Plan of Care (Signed)
?  Problem: Clinical Measurements: ?Goal: Will remain free from infection ?Outcome: Progressing ?  ?

## 2022-06-23 DIAGNOSIS — S72001D Fracture of unspecified part of neck of right femur, subsequent encounter for closed fracture with routine healing: Secondary | ICD-10-CM | POA: Diagnosis not present

## 2022-06-23 DIAGNOSIS — I639 Cerebral infarction, unspecified: Secondary | ICD-10-CM | POA: Diagnosis not present

## 2022-06-23 DIAGNOSIS — I10 Essential (primary) hypertension: Secondary | ICD-10-CM | POA: Diagnosis not present

## 2022-06-23 DIAGNOSIS — E876 Hypokalemia: Secondary | ICD-10-CM | POA: Diagnosis not present

## 2022-06-23 LAB — CBC WITH DIFFERENTIAL/PLATELET
Abs Immature Granulocytes: 0.05 10*3/uL (ref 0.00–0.07)
Basophils Absolute: 0 10*3/uL (ref 0.0–0.1)
Basophils Relative: 0 %
Eosinophils Absolute: 0.1 10*3/uL (ref 0.0–0.5)
Eosinophils Relative: 1 %
HCT: 29 % — ABNORMAL LOW (ref 36.0–46.0)
Hemoglobin: 9.6 g/dL — ABNORMAL LOW (ref 12.0–15.0)
Immature Granulocytes: 1 %
Lymphocytes Relative: 7 %
Lymphs Abs: 0.6 10*3/uL — ABNORMAL LOW (ref 0.7–4.0)
MCH: 34.4 pg — ABNORMAL HIGH (ref 26.0–34.0)
MCHC: 33.1 g/dL (ref 30.0–36.0)
MCV: 103.9 fL — ABNORMAL HIGH (ref 80.0–100.0)
Monocytes Absolute: 0.6 10*3/uL (ref 0.1–1.0)
Monocytes Relative: 7 %
Neutro Abs: 7.4 10*3/uL (ref 1.7–7.7)
Neutrophils Relative %: 84 %
Platelets: 369 10*3/uL (ref 150–400)
RBC: 2.79 MIL/uL — ABNORMAL LOW (ref 3.87–5.11)
RDW: 12.6 % (ref 11.5–15.5)
WBC: 8.8 10*3/uL (ref 4.0–10.5)
nRBC: 0 % (ref 0.0–0.2)

## 2022-06-23 LAB — VITAMIN D 25 HYDROXY (VIT D DEFICIENCY, FRACTURES): Vit D, 25-Hydroxy: 35.45 ng/mL (ref 30–100)

## 2022-06-23 LAB — IRON AND TIBC
Iron: 18 ug/dL — ABNORMAL LOW (ref 28–170)
Saturation Ratios: 9 % — ABNORMAL LOW (ref 10.4–31.8)
TIBC: 213 ug/dL — ABNORMAL LOW (ref 250–450)
UIBC: 195 ug/dL

## 2022-06-23 LAB — BASIC METABOLIC PANEL
Anion gap: 7 (ref 5–15)
BUN: 12 mg/dL (ref 8–23)
CO2: 19 mmol/L — ABNORMAL LOW (ref 22–32)
Calcium: 8.2 mg/dL — ABNORMAL LOW (ref 8.9–10.3)
Chloride: 108 mmol/L (ref 98–111)
Creatinine, Ser: 0.71 mg/dL (ref 0.44–1.00)
GFR, Estimated: >60 mL/min (ref >60)
Glucose, Bld: 161 mg/dL — ABNORMAL HIGH (ref 70–99)
Potassium: 3.2 mmol/L — ABNORMAL LOW (ref 3.5–5.1)
Sodium: 134 mmol/L — ABNORMAL LOW (ref 135–145)

## 2022-06-23 LAB — VITAMIN B12: Vitamin B-12: 2717 pg/mL — ABNORMAL HIGH (ref 180–914)

## 2022-06-23 LAB — MAGNESIUM: Magnesium: 1.9 mg/dL (ref 1.7–2.4)

## 2022-06-23 LAB — RETICULOCYTES
Immature Retic Fract: 8.6 % (ref 2.3–15.9)
RBC.: 2.76 MIL/uL — ABNORMAL LOW (ref 3.87–5.11)
Retic Count, Absolute: 33.1 10*3/uL (ref 19.0–186.0)
Retic Ct Pct: 1.2 % (ref 0.4–3.1)

## 2022-06-23 LAB — FOLATE: Folate: 34.9 ng/mL (ref >5.9)

## 2022-06-23 LAB — FERRITIN: Ferritin: 95 ng/mL (ref 11–307)

## 2022-06-23 MED ORDER — POTASSIUM CHLORIDE CRYS ER 20 MEQ PO TBCR
40.0000 meq | EXTENDED_RELEASE_TABLET | Freq: Two times a day (BID) | ORAL | Status: DC
  Administered 2022-06-23: 40 meq via ORAL
  Filled 2022-06-23: qty 2

## 2022-06-23 MED ORDER — HYDROCODONE-ACETAMINOPHEN 5-325 MG PO TABS: 1.0000 | ORAL_TABLET | Freq: Four times a day (QID) | ORAL | 0 refills | Status: AC | PRN

## 2022-06-23 MED ORDER — FERROUS SULFATE 325 (65 FE) MG PO TABS
325.0000 mg | ORAL_TABLET | Freq: Two times a day (BID) | ORAL | Status: DC
  Administered 2022-06-24 – 2022-06-26 (×5): 325 mg via ORAL
  Filled 2022-06-23 (×5): qty 1

## 2022-06-23 MED ORDER — POTASSIUM CHLORIDE CRYS ER 20 MEQ PO TBCR
40.0000 meq | EXTENDED_RELEASE_TABLET | Freq: Two times a day (BID) | ORAL | Status: AC
  Administered 2022-06-23 (×2): 40 meq via ORAL
  Filled 2022-06-23 (×2): qty 2

## 2022-06-23 MED ORDER — SENNA 8.6 MG PO TABS
2.0000 | ORAL_TABLET | Freq: Every day | ORAL | Status: DC
  Administered 2022-06-23 – 2022-06-25 (×3): 17.2 mg via ORAL
  Filled 2022-06-23 (×4): qty 2

## 2022-06-23 NOTE — Progress Notes (Signed)
   ORTHOPAEDIC PROGRESS NOTE  s/p Procedure(s): PERCUTANEOUS PINNING OF RIGHT HIP on 06/22/2022 with Dr. Griffin Basil  SUBJECTIVE: Patient overall doing okay. Husband at bedside. They want to make sure insurance does not force them to leave SNF too early like they did previously.   OBJECTIVE: PE: RLE: dressing CDI, warm well perfused foot, intact EHL/TA/GSC   Vitals:   06/23/22 0540 06/23/22 0811  BP: (!) 126/58 118/69  Pulse: (!) 55 (!) 57  Resp: 17 18  Temp: 98.3 F (36.8 C) 97.8 F (36.6 C)  SpO2: 99% 100%   Stable post-op images  ASSESSMENT: Angela Buchanan is a 71 y.o. female POD#1  PLAN: Weightbearing: WBAT RLE Insicional and dressing care: Reinforce dressings as needed Orthopedic device(s): None Showering: post-op day #2 with assistance VTE prophylaxis: Resume Plavix and Aspirin Pain control: PRN pain medications, minimize narcotics as able Follow - up plan: 2 weeks in office Dispo: TBD. PT/OT evals today. Anticipate need for SNF. TOC consult has been placed.  Contact information:  Weekdays 8-5 Dr. Ophelia Charter, Noemi Chapel PA-C, After hours and holidays please check [email or web address] for group call information for [facility]   Noemi Chapel, PA-C 06/23/2022

## 2022-06-23 NOTE — Progress Notes (Signed)
PROGRESS NOTE    Angela Buchanan  TWS:[id number] DOB: October 08, 1950 DOA: 06/21/2022 PCP: Glenis Smoker, MD    Chief Complaint  Patient presents with   Fall   Hip Pain    Brief Narrative:   Angela Buchanan is a 71 y.o. female with medical history significant of typical chest pain right breast cancer, carotid bruit, diverticulosis/diverticulitis, hypertension, premature atrial contractions, PVCs, bigeminy, seasonal allergies who was recently discharged from the hospital due to left pain large subcortical infarct who is coming to the emergency department due to having a fall at home.   Right hip x-ray shows right femoral neck fracture.  CT of the right hip also showed small ill-defined hematoma seen in the subcutaneous soft tissues at the lateral aspect of the right hip and proximal thigh.   Assessment & Plan:   Principal Problem:   Closed fracture of femur, neck (HCC) Active Problems:   Essential hypertension   Pure hypercholesterolemia   CVA (cerebral vascular accident) ([place])   Urinary frequency   Hypokalemia   Hypothyroidism   Malnutrition of moderate degree   Right femoral neck fracture Patient underwent Right hip closed reduction percutaneous pinning by Dr Griffin Basil. Pain control and therapy evaluations recommending snf.     Hypertension; BP parameters are optimal.     Hypothyroidism:  Resume synthroid.    Hyperlipidemia:  Resume statin.     Hypokalemia:  Replaced and recheck levels in am.    Macrocytic anemia: / ACUTE ANEMIA OF BLOOD LOSS from the procedure.  Check anemia panel.  Hemoglobin dropped from 12.2 on admission to 9.6 today. . Transfuse to keep hemoglobin greater than 7.  Continue to  monitor as CT hip show ill-defined hematoma seen in the subcutaneous soft tissues at the lateral aspect of the right hip and proximal thigh.    DVT prophylaxis: Lovenox  Code Status: full code.  Family Communication: None at bedside.  Disposition:   Status  is: Inpatient Remains inpatient appropriate because: OR today for    Level of care: Telemetry Consultants:  Orthopedics.   Procedures: none.   Antimicrobials: none.   Subjective: Pain is improving.    Objective: Vitals:   06/23/22 0150 06/23/22 0540 06/23/22 0811 06/23/22 1256  BP: 127/65 (!) 126/58 118/69 131/61  Pulse: 81 (!) 55 (!) 57 68  Resp: '17 17 18 16  '$ Temp: 97.8 F (36.6 C) 98.3 F (36.8 C) 97.8 F (36.6 C) 97.9 F (36.6 C)  TempSrc: Oral Oral Oral Oral  SpO2: 95% 99% 100% 96%  Weight:      Height:        Intake/Output Summary (Last 24 hours) at 06/23/2022 1540 Last data filed at 06/23/2022 0540 Gross per 24 hour  Intake 55 ml  Output 1150 ml  Net -1095 ml    Filed Weights   06/22/22 0050  Weight: 49.4 kg    Examination:  General exam: Appears calm and comfortable  Respiratory system: Clear to auscultation. Respiratory effort normal. Cardiovascular system: S1 & S2 heard, RRR. No JVD,  No pedal edema. Gastrointestinal system: Abdomen is nondistended, soft and nontender. Normal bowel sounds heard. Central nervous system: Alert and oriented. No focal neurological deficits. Extremities: painful ROM of the right leg.  Skin: No rashes, lesions or ulcers Psychiatry: Mood & affect appropriate.      Data Reviewed: I have personally reviewed following labs and imaging studies  CBC: Recent Labs  Lab 06/21/22 1231 06/22/22 0412 06/23/22 0409  WBC 5.5 8.6 8.8  NEUTROABS 4.3  --  7.4  HGB 12.2 11.0* 9.6*  HCT 35.8* 32.1* 29.0*  MCV 101.4* 101.3* 103.9*  PLT 435* 406* 369     Basic Metabolic Panel: Recent Labs  Lab 06/21/22 1231 06/21/22 1815 06/22/22 0412 06/23/22 0409  NA 141  --  138 134*  K 2.4* 2.9* 3.4* 3.2*  CL 110  --  112* 108  CO2 22  --  21* 19*  GLUCOSE 106*  --  153* 161*  BUN 12  --  13 12  CREATININE 0.76  --  0.71 0.71  CALCIUM 9.2  --  9.1 8.2*  MG 2.0  --   --  1.9     GFR: Estimated Creatinine Clearance: 49.6  mL/min (by C-G formula based on SCr of 0.71 mg/dL).  Liver Function Tests: Recent Labs  Lab 06/22/22 0412  AST 22  ALT 20  ALKPHOS 61  BILITOT 0.5  PROT 6.0*  ALBUMIN 3.5     CBG: No results for input(s): "GLUCAP" in the last 168 hours.   Recent Results (from the past 240 hour(s))  Surgical pcr screen     Status: None   Collection Time: 06/21/22  4:42 PM   Specimen: Nasal Mucosa; Nasal Swab  Result Value Ref Range Status   MRSA, PCR NEGATIVE NEGATIVE Final   Staphylococcus aureus NEGATIVE NEGATIVE Final    Comment: (NOTE) The Xpert SA Assay (FDA approved for NASAL specimens in patients 70 years of age and older), is one component of a comprehensive surveillance program. It is not intended to diagnose infection nor to guide or monitor treatment. Performed at [facility], [place] [address] [place], [place] [address]          Radiology Studies: DG Hip Port Unilat With Pelvis 1V Right  Result Date: 06/22/2022 CLINICAL DATA:  Postoperative right hip EXAM: DG HIP (WITH OR WITHOUT PELVIS) 1V PORT RIGHT COMPARISON:  Right hip radiographs 06/21/2022; CT right hip 06/21/2022 FINDINGS: There are 3 screws now fixating the previously seen proximal right femoral neck fracture. Minimal lateral cortical step-off but otherwise normal alignment on the provided two views. No evidence of hardware failure. Mild superior left femoroacetabular joint space narrowing. Mild pubic symphysis joint space narrowing. IMPRESSION: Interval ORIF of the previously seen right femoral neck fracture with near anatomic alignment. Electronically Signed   By: Yvonne Kendall M.D.   On: 06/22/2022 14:25   DG HIP UNILAT WITH PELVIS 2-3 VIEWS RIGHT  Result Date: 06/22/2022 CLINICAL DATA:  Right hip fracture EXAM: DG HIP (WITH OR WITHOUT PELVIS) 2-3V RIGHT COMPARISON:  06/21/2022 FINDINGS: Eleven C-arm images the right hip were obtained. These reveal placement of 3 threaded screws across the right  femoral neck fracture. Hardware in good position. Fracture in satisfactory alignment. IMPRESSION: Screw fixation of right femoral neck fracture. Electronically Signed   By: Franchot Gallo M.D.   On: 06/22/2022 13:34   DG C-Arm 1-60 Min-No Report  Result Date: 06/22/2022 Fluoroscopy was utilized by the requesting physician.  No radiographic interpretation.        Scheduled Meds:  acetaminophen  500 mg Oral Q6H   amLODipine  5 mg Oral Daily   aspirin EC  81 mg Oral Daily   clopidogrel  75 mg Oral Daily   docusate sodium  100 mg Oral BID   feeding supplement  237 mL Oral BID BM   [START ON 06/24/2022] ferrous sulfate  325 mg Oral BID WC   levothyroxine  25 mcg  Oral Q0600   lisinopril  40 mg Oral Daily   metoprolol tartrate  25 mg Oral QPM   multivitamin with minerals  1 tablet Oral Daily   pantoprazole  40 mg Oral Q1200   potassium chloride  40 mEq Oral BID   pravastatin  40 mg Oral Daily   senna  2 tablet Oral QHS   Continuous Infusions:  methocarbamol (ROBAXIN) IV Stopped (06/22/22 1449)     LOS: 2 days        Hosie Poisson, MD [facility]   To contact the attending provider between 7A-7P or the covering provider during after hours 7P-7A, please log into the web site [email or web address] and access using universal [place] password for that web site. If you do not have the password, please call the hospital operator.  06/23/2022, 3:40 PM

## 2022-06-23 NOTE — TOC Initial Note (Signed)
Transition of Care [facility]) - Initial/Assessment Note    Patient Details  Name: Angela Buchanan MRN: [id number] Date of Birth: [birth year]  [facility]) CM/SW Contact:    Lennart Pall, LCSW Phone Number: 06/23/2022, 2:21 PM  Clinical Narrative:                 Met with pt and spouse today to introduce TOC/CSW role and review dc planning needs.  Both very frustrated with overall situation and note she had just returned home from [facility] SNF prior to this fall/ fx.  (Had been at [facility] for rehab following a CVA.)  They are agreed that they feel best plan is to pursue SNF rehab again and would like to try and secure a bed at [facility] again if possible.  Will reach out to area facilities and begin bed search and have alerted [facility] as well.   Expected Discharge Plan: Skilled Nursing Facility Barriers to Discharge: Continued Medical Work up, Ship broker, SNF Pending bed offer   Patient Goals and CMS Choice Patient states their goals for this hospitalization and ongoing recovery are:: return home after rehab CMS Medicare.gov Compare Post Acute Care list provided to:: Patient Choice offered to / list presented to : Patient, Spouse  Expected Discharge Plan and Services Expected Discharge Plan: [place] In-house Referral: Clinical Social Work   Post Acute Care Choice: [place] (prefer [facility] if possible) Living arrangements for the past 2 months: Single Family Home                 DME Arranged: N/A DME Agency: NA                  Prior Living Arrangements/Services Living arrangements for the past 2 months: Single Family Home Lives with:: Spouse Patient language and need for interpreter reviewed:: Yes Do you feel safe going back to the place where you live?: Yes      Need for Family Participation in Patient Care: Yes (Comment) Care giver support system in place?: Yes (comment)   Criminal Activity/Legal Involvement  Pertinent to Current Situation/Hospitalization: No - Comment as needed  Activities of Daily Living Home Assistive Devices/Equipment: Eyeglasses, Environmental consultant (specify type) ADL Screening (condition at time of admission) Patient's cognitive ability adequate to safely complete daily activities?: Yes Is the patient deaf or have difficulty hearing?: No Does the patient have difficulty seeing, even when wearing glasses/contacts?: No Does the patient have difficulty concentrating, remembering, or making decisions?: No Patient able to express need for assistance with ADLs?: Yes Does the patient have difficulty dressing or bathing?: No Independently performs ADLs?: No Communication: Independent Dressing (OT): Needs assistance Is this a change from baseline?: Pre-admission baseline Grooming: Needs assistance Is this a change from baseline?: Pre-admission baseline Feeding: Needs assistance Is this a change from baseline?: Pre-admission baseline Bathing: Needs assistance Is this a change from baseline?: Pre-admission baseline Toileting: Needs assistance Is this a change from baseline?: Pre-admission baseline In/Out Bed: Needs assistance Is this a change from baseline?: Pre-admission baseline Walks in Home: Needs assistance Is this a change from baseline?: Pre-admission baseline Does the patient have difficulty walking or climbing stairs?: Yes Weakness of Legs: Right Weakness of Arms/Hands: Right  Permission Sought/Granted Permission sought to share information with : Family Supports, Chartered certified accountant granted to share information with : Yes, Verbal Permission Granted  Share Information with NAME: [name] Locher     Permission granted to share info w Relationship: spouse  Permission  granted to share info w Contact Information: [phone]  Emotional Assessment Appearance:: Appears stated age Attitude/Demeanor/Rapport: Gracious Affect (typically observed):  Accepting Orientation: : Oriented to Self, Oriented to Place, Oriented to  Time, Oriented to Situation Alcohol / Substance Use: Not Applicable Psych Involvement: No (comment)  Admission diagnosis:  Closed fracture of femur, neck ([place]) [S72.009A] Closed fracture of neck of right femur, initial encounter ([place]) [S72.001A] Patient Active Problem List   Diagnosis Date Noted   Malnutrition of moderate degree 06/22/2022   Closed fracture of femur, neck ([place]) 06/21/2022   Urinary frequency 06/21/2022   Hypokalemia 06/21/2022   Heart murmur 06/21/2022   Hypothyroidism 06/21/2022   Dysphagia 05/17/2022   Stroke (cerebrum) ([place]) 05/13/2022   CVA (cerebral vascular accident) ([place]) 05/12/2022   Osteoporosis 01/24/2022   Atypical chest pain 07/19/2021   Temporomandibular joint (TMJ) pain 07/16/2020   Referred otalgia of both ears 07/16/2020   PAC (premature atrial contraction) 05/11/2020   PVC (premature ventricular contraction) 05/11/2020   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   Carotid bruit 11/13/2019   Pure hypercholesterolemia 11/13/2019   Malignant neoplasm of upper-outer quadrant of right breast in female, estrogen receptor positive ([place]) 05/22/2017   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   Diplopia 09/17/2014   Dizziness and giddiness 09/17/2014   Essential hypertension 07/22/2014   Chest pain 07/22/2014   Family history of heart disease 07/22/2014   PCP:  Glenis Smoker, MD Pharmacy:   [facility], [place] [address] Phone: [phone] Fax: [phone]  [facility] [address] [place] [address] Phone: [phone] Fax: [phone]     Social Determinants of Health (SDOH) Interventions    Readmission Risk Interventions    06/23/2022    2:19 PM  Readmission Risk Prevention Plan  Transportation Screening Complete  PCP or Specialist  Appt within 5-7 Days Complete  Home Care Screening Complete  Medication Review (RN CM) Complete

## 2022-06-23 NOTE — NC FL2 (Signed)
[place] LEVEL OF CARE SCREENING TOOL     IDENTIFICATION  Patient Name: Angela Buchanan Birthdate: Nov 04, 1950 Sex: female Admission Date (Current Location): 06/21/2022  [facility] and [place] Number:  Herbalist and Address:  [facility],  [address], [place]      Provider Number: [id number]  Attending Physician Name and Address:  Hosie Poisson, MD  Relative Name and Phone Number:  spouse, Lore Polka [phone]    Current Level of Care: Hospital Recommended Level of Care: [place] Prior Approval Number:    Date Approved/Denied:   PASRR Number: [id number]  Discharge Plan: SNF    Current Diagnoses: Patient Active Problem List   Diagnosis Date Noted   Malnutrition of moderate degree 06/22/2022   Closed fracture of femur, neck ([place]) 06/21/2022   Urinary frequency 06/21/2022   Hypokalemia 06/21/2022   Heart murmur 06/21/2022   Hypothyroidism 06/21/2022   Dysphagia 05/17/2022   Stroke (cerebrum) ([place]) 05/13/2022   CVA (cerebral vascular accident) ([place]) 05/12/2022   Osteoporosis 01/24/2022   Atypical chest pain 07/19/2021   Temporomandibular joint (TMJ) pain 07/16/2020   Referred otalgia of both ears 07/16/2020   PAC (premature atrial contraction) 05/11/2020   PVC (premature ventricular contraction) 05/11/2020   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   Carotid bruit 11/13/2019   Pure hypercholesterolemia 11/13/2019   Malignant neoplasm of upper-outer quadrant of right breast in female, estrogen receptor positive ([place]) 05/22/2017   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   Diplopia 09/17/2014   Dizziness and giddiness 09/17/2014   Essential hypertension 07/22/2014   Chest pain 07/22/2014   Family history of heart disease 07/22/2014    Orientation RESPIRATION BLADDER Height & Weight     Self, Time, Situation, Place  Normal Continent, External catheter (currently with purewick) Weight: 108 lb 14.5 oz  (49.4 kg) Height:  '5\' 2"'$  (157.5 cm)  BEHAVIORAL SYMPTOMS/MOOD NEUROLOGICAL BOWEL NUTRITION STATUS      Continent    AMBULATORY STATUS COMMUNICATION OF NEEDS Skin   Extensive Assist Verbally Other (Comment) (surgical incision only)                       Personal Care Assistance Level of Assistance  Bathing, Dressing Bathing Assistance: Limited assistance   Dressing Assistance: Limited assistance     Functional Limitations Info             Wall  PT (By licensed PT), OT (By licensed OT)     PT Frequency: 5x/wk OT Frequency: 5x/wk            Contractures Contractures Info: Not present    Additional Factors Info  Code Status, Allergies Code Status Info: Full Allergies Info: Atenolol, Bisoprolol, Prednisone, Amoxicillin, Amoxicillin-pot Clavulanate, Hctz (Hydrochlorothiazide), Rosuvastatin, Latex           Current Medications (06/23/2022):  This is the current hospital active medication list Current Facility-Administered Medications  Medication Dose Route Frequency Provider Last Rate Last Admin   acetaminophen (TYLENOL) tablet 325-650 mg  325-650 mg Oral Q6H PRN McBane, Maylene Roes, PA-C       acetaminophen (TYLENOL) tablet 500 mg  500 mg Oral Q6H McBane, Caroline N, PA-C   500 mg at 06/23/22 0535   amLODipine (NORVASC) tablet 5 mg  5 mg Oral Daily Ethelda Chick, PA-C   5 mg at 06/21/22 1901   aspirin EC tablet 81 mg  81 mg Oral Daily Karren Cobble, [place]  81 mg at 06/23/22 0902   bisacodyl (DULCOLAX) EC tablet 5 mg  5 mg Oral Daily PRN Ethelda Chick, PA-C       clopidogrel (PLAVIX) tablet 75 mg  75 mg Oral Daily Karren Cobble, RPH   75 mg at 06/23/22 0902   docusate sodium (COLACE) capsule 100 mg  100 mg Oral BID Ethelda Chick, PA-C   100 mg at 06/23/22 0902   feeding supplement (ENSURE ENLIVE / ENSURE PLUS) liquid 237 mL  237 mL Oral BID BM Ethelda Chick, PA-C   237 mL at 06/22/22 1952   [START ON  06/24/2022] ferrous sulfate tablet 325 mg  325 mg Oral BID WC Hosie Poisson, MD       HYDROcodone-acetaminophen (NORCO) 7.5-325 MG per tablet 1-2 tablet  1-2 tablet Oral Q4H PRN Ethelda Chick, PA-C   2 tablet at 06/22/22 1952   HYDROcodone-acetaminophen (NORCO/VICODIN) 5-325 MG per tablet 1-2 tablet  1-2 tablet Oral Q4H PRN Ethelda Chick, PA-C   2 tablet at 06/23/22 1325   hydrOXYzine (ATARAX) tablet 25 mg  25 mg Oral TID PRN Ethelda Chick, PA-C   25 mg at 06/22/22 2245   levothyroxine (SYNTHROID) tablet 25 mcg  25 mcg Oral Q0600 Ethelda Chick, PA-C   25 mcg at 06/23/22 0535   lisinopril (ZESTRIL) tablet 40 mg  40 mg Oral Daily Ethelda Chick, PA-C   40 mg at 06/21/22 1906   menthol-cetylpyridinium (CEPACOL) lozenge 3 mg  1 lozenge Oral PRN Ethelda Chick, PA-C   3 mg at 06/22/22 1953   Or   phenol (CHLORASEPTIC) mouth spray 1 spray  1 spray Mouth/Throat PRN McBane, Maylene Roes, PA-C       methocarbamol (ROBAXIN) tablet 500 mg  500 mg Oral Q6H PRN Ethelda Chick, PA-C   500 mg at 06/23/22 1325   Or   methocarbamol (ROBAXIN) 500 mg in dextrose 5 % 50 mL IVPB  500 mg Intravenous Q6H PRN Ethelda Chick, PA-C   Stopping previously hung infusion at 06/22/22 1449   metoprolol tartrate (LOPRESSOR) tablet 25 mg  25 mg Oral QPM McBane, Maylene Roes, PA-C   25 mg at 06/22/22 1824   morphine (PF) 2 MG/ML injection 0.5-1 mg  0.5-1 mg Intravenous Q2H PRN Ethelda Chick, PA-C   1 mg at 06/23/22 1013   multivitamin with minerals tablet 1 tablet  1 tablet Oral Daily Ethelda Chick, PA-C   1 tablet at 06/23/22 0902   ondansetron (ZOFRAN) tablet 4 mg  4 mg Oral Q6H PRN Ethelda Chick, PA-C   4 mg at 06/23/22 0901   Or   ondansetron (ZOFRAN) injection 4 mg  4 mg Intravenous Q6H PRN McBane, Caroline N, PA-C       pantoprazole (PROTONIX) EC tablet 40 mg  40 mg Oral Q1200 McBane, Caroline N, PA-C   40 mg at 06/23/22 1325   polyethylene glycol (MIRALAX / GLYCOLAX) packet 17 g   17 g Oral Daily PRN McBane, Caroline N, PA-C       potassium chloride SA (KLOR-CON M) CR tablet 40 mEq  40 mEq Oral BID Hosie Poisson, MD   40 mEq at 06/23/22 0902   pravastatin (PRAVACHOL) tablet 40 mg  40 mg Oral Daily Ethelda Chick, PA-C   40 mg at 06/23/22 0901   senna (SENOKOT) tablet 17.2 mg  2 tablet Oral QHS Hosie Poisson, MD  Discharge Medications: Please see discharge summary for a list of discharge medications.  Relevant Imaging Results:  Relevant Lab Results:   Additional Information SS#: [id number]  Lennart Pall, LCSW

## 2022-06-23 NOTE — Evaluation (Signed)
Physical Therapy Evaluation Patient Details Name: Angela Buchanan MRN: [id number] DOB: 1951/05/15 Today's Date: 06/23/2022  History of Present Illness  71 y.o. female with medical history significant of typical chest pain right breast cancer, carotid bruit, diverticulosis/diverticulitis, hypertension, premature atrial contractions, PVCs, bigeminy, seasonal allergies with a recent admission the hospital 05/12/22 due to left pain large subcortical infarct who is coming to the emergency department due to having a fall at home. Dx R hip fx, s/p percutaneous pinning 06/22/22.  Clinical Impression  Pt admitted with above diagnosis. +2 mod assist for stand pivot transfer to recliner from bed. Pt was ambulating with a hemiwalker at home prior to admission, she had only been home from [facility] from recent stroke for one week prior to this admission. Pt has RUE weakness from stroke, will plan trial of platform RW next session as pt has good triceps strength on R but poor grip on R, she was unable to maintain grip on a RW this session.  Pt currently with functional limitations due to the deficits listed below (see PT Problem List). Pt will benefit from skilled PT to increase their independence and safety with mobility to allow discharge to the venue listed below.          Recommendations for follow up therapy are one component of a multi-disciplinary discharge planning process, led by the attending physician.  Recommendations may be updated based on patient status, additional functional criteria and insurance authorization.  Follow Up Recommendations Skilled nursing-short term rehab (<3 hours/day) Can patient physically be transported by private vehicle: No    Assistance Recommended at Discharge Frequent or constant Supervision/Assistance  Patient can return home with the following  A lot of help with walking and/or transfers;A lot of help with bathing/dressing/bathroom;Assistance with  cooking/housework;Assist for transportation;Help with stairs or ramp for entrance    Equipment Recommendations Other (comment) (TBD)  Recommendations for Other Services       Functional Status Assessment Patient has had a recent decline in their functional status and demonstrates the ability to make significant improvements in function in a reasonable and predictable amount of time.     Precautions / Restrictions Precautions Precautions: Fall Precaution Comments: fell just prior to admission; RUE hemiparesis 2* recent CVA Restrictions Weight Bearing Restrictions: No RLE Weight Bearing: Weight bearing as tolerated      Mobility  Bed Mobility Overal bed mobility: Needs Assistance Bed Mobility: Supine to Sit     Supine to sit: Mod assist, +2 for physical assistance, +2 for safety/equipment     General bed mobility comments: able to assist with advancing BUEs to EOB using gait belt as RLE lifter, Mod A to raise trunk and pivot hips    Transfers Overall transfer level: Needs assistance   Transfers: Sit to/from Stand, Bed to chair/wheelchair/BSC Sit to Stand: +2 physical assistance, +2 safety/equipment, Mod assist Stand pivot transfers: +2 physical assistance, +2 safety/equipment, Mod assist         General transfer comment: +2 mod assist to power up, +2 mod assist to pivot to recliner. Attempted sit to stand with RW, pt unable to maintain grip on RW so switched to +2 pivot. Left platform RW in room for trial next session as pt has good triceps on RUE but poor grip.    Ambulation/Gait                  Science writer  Modified Rankin (Stroke Patients Only)       Balance Overall balance assessment: Needs assistance   Sitting balance-Leahy Scale: Good     Standing balance support: Bilateral upper extremity supported, Reliant on assistive device for balance, During functional activity Standing balance-Leahy Scale:  Poor Standing balance comment: posterior lean in standing initially 2* pain with WBing on RLE                             Pertinent Vitals/Pain Pain Assessment Pain Assessment: 0-10 Pain Score: 5  Pain Location: R hip with movement Pain Descriptors / Indicators: Sore Pain Intervention(s): Limited activity within patient's tolerance, Monitored during session, Premedicated before session, Repositioned, Ice applied    Home Living Family/patient expects to be discharged to:: Private residence Living Arrangements: Spouse/significant other Available Help at Discharge: Family;Available 24 hours/day Type of Home: House Home Access: Stairs to enter Entrance Stairs-Rails: None Entrance Stairs-Number of Steps: 3   Home Layout: One level Home Equipment: Other (comment) Additional Comments: hemiwalker    Prior Function Prior Level of Function : Needs assist             Mobility Comments: was walking with hemiwalker at home PTA, had only been home from [facility] for CVA for one week       Hand Dominance   Dominant Hand: Right    Extremity/Trunk Assessment   Upper Extremity Assessment Upper Extremity Assessment: RUE deficits/detail;Defer to OT evaluation RUE Deficits / Details: grip weak, not functional, ~4/5 triceps    Lower Extremity Assessment Lower Extremity Assessment: RLE deficits/detail RLE Deficits / Details: hip flexion AAROM ~40* limited by pain, knee ext at least 3/5, ankle DF 4/5 RLE: Unable to fully assess due to pain RLE Sensation: WNL RLE Coordination: WNL    Cervical / Trunk Assessment Cervical / Trunk Assessment: Normal  Communication   Communication: Expressive difficulties  Cognition Arousal/Alertness: Awake/alert Behavior During Therapy: WFL for tasks assessed/performed Overall Cognitive Status: Within Functional Limits for tasks assessed                                          General Comments      Exercises General  Exercises - Lower Extremity Ankle Circles/Pumps: AROM, Both, 10 reps, Supine Long Arc Quad: AROM, Right, 5 reps, Seated Heel Slides: AAROM, Right, 15 reps, Supine Hip ABduction/ADduction: AAROM, Right, 15 reps, Supine   Assessment/Plan    PT Assessment Patient needs continued PT services  PT Problem List Decreased mobility;Decreased activity tolerance;Decreased balance;Pain;Decreased strength       PT Treatment Interventions Gait training;Therapeutic exercise;Patient/family education;Functional mobility training;Therapeutic activities;Balance training    PT Goals (Current goals can be found in the Care Plan section)  Acute Rehab PT Goals Patient Stated Goal: grocery shopping, going out to eat with friends PT Goal Formulation: With patient Time For Goal Achievement: 07/07/22 Potential to Achieve Goals: Good    Frequency 7X/week     Co-evaluation               AM-PAC PT "6 Clicks" Mobility  Outcome Measure Help needed turning from your back to your side while in a flat bed without using bedrails?: A Little Help needed moving from lying on your back to sitting on the side of a flat bed without using bedrails?: A Lot Help needed moving to and from a  bed to a chair (including a wheelchair)?: A Lot Help needed standing up from a chair using your arms (e.g., wheelchair or bedside chair)?: A Lot Help needed to walk in hospital room?: Total Help needed climbing 3-5 steps with a railing? : Total 6 Click Score: 11    End of Session Equipment Utilized During Treatment: Gait belt Activity Tolerance: Patient limited by pain Patient left: in chair;with chair alarm set;with call bell/phone within reach Nurse Communication: Mobility status PT Visit Diagnosis: Muscle weakness (generalized) (M62.81);Difficulty in walking, not elsewhere classified (R26.2);Pain;History of falling (Z91.81) Pain - Right/Left: Right Pain - part of body: Hip    Time: 4709-2957 PT Time Calculation (min)  (ACUTE ONLY): 23 min   Charges:   PT Evaluation $PT Eval Moderate Complexity: 1 Mod PT Treatments $Therapeutic Activity: 8-22 mins        Blondell Reveal Kistler PT 06/23/2022  Acute Rehabilitation Services  Office [phone]

## 2022-06-23 NOTE — Evaluation (Signed)
Clinical/Bedside Swallow Evaluation Patient Details  Name: Angela Buchanan MRN: [id number] Date of Birth: Jan 25, 1951  Today's Date: 06/23/2022 Time: SLP Start Time (ACUTE ONLY): 3212 SLP Stop Time (ACUTE ONLY): 1511 SLP Time Calculation (min) (ACUTE ONLY): 56 min  Past Medical History:  Past Medical History:  Diagnosis Date   Atypical chest pain 07/19/2021   Bigeminy    no current med.   Breast cancer ([place]) 06/2017   right   Bruises easily    Carotid bruit 11/13/2019   Dental crowns present    History of diverticulitis    Hypertension    states under control with med., has been on med. x 5 yr.   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   Past Surgical History:  Past Surgical History:  Procedure Laterality Date   ABDOMINAL HYSTERECTOMY     partial   BREAST LUMPECTOMY Right 06/22/2017   Malignant   BREAST LUMPECTOMY WITH RADIOACTIVE SEED AND SENTINEL LYMPH NODE BIOPSY Right 06/22/2017   Procedure: RIGHT BREAST LUMPECTOMY WITH RADIOACTIVE SEED AND RIGHT AXILLARY DEEP SENTINEL LYMPH NODE BIOPSY WITH BLUE DYE INJECTION;  Surgeon: Fanny Skates, MD;  Location: [facility];  Service: General;  Laterality: Right;   BREAST LUMPECTOMY WITH RADIOACTIVE SEED LOCALIZATION Left 06/22/2017   Procedure: LEFT BREAST LUMPECTOMY WITH RADIOACTIVE SEED LOCALIZATION;  Surgeon: Fanny Skates, MD;  Location: [facility];  Service: General;  Laterality: Left;   CATARACT EXTRACTION W/ INTRAOCULAR LENS  IMPLANT, BILATERAL Bilateral    EXCISION OF BREAST BIOPSY Left 06/22/2017   benign   LOOP RECORDER INSERTION N/A 05/16/2022   Procedure: LOOP RECORDER INSERTION;  Surgeon: Vickie Epley, MD;  Location: [facility];   Service: Cardiovascular;  Laterality: N/A;   TONSILLECTOMY     age 46   HPI:  PT is a 71 yo female with medical history significant of typical chest pain right breast cancer, carotid bruit, diverticulosis/diverticulitis, hypertension, premature atrial contractions, PVCs, bigeminy, seasonal allergies with a recent admission the hospital 05/12/22 due to left sided discomfort - found to have a large subcortical infarct - and now presenting to emergency department due to having a fall at home. Dx R hip fx, s/p percutaneous pinning 06/22/22.  Pt has some difficulties with dysphagia PTA - endorsing metallic taste with anything she eats since her stroke.  Prior MBS in 2023 showed "mild oral deficits with more adequate pharyngeal swallow function. She has reduced labial seal for sucking on a straw, which is improved with placement of the straw on her L side. She had reduced posterior propulsion with the barium tablet, but cleared it from her oral cavity with an additional liquid wash. Her pharyngeal phase was grossly functional and she had good airway protection. Note that the barium tablet appeared to move slowly and then ultimately stop in her distal esophagus (MD not present to confirm)". Swallow evaluation ordered during this hospital coarse. CXR 06/22/2022 negative    Assessment / Plan / Recommendation  Clinical Impression  Clinical swallow evaluation completed - Pt with known h/o dysphagia since  her prior CVA 05/12/2022.  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  She reports occasional coughing with liquids if she is "not careful" and advised that she avoids using straws.  Pt observed consuming water, Ensure, applesauce and saltine cracker. Swallow clinically judged to be  timely with liquids. Pt has always eaten slowly per her spouse and currenlty she is eating slower to compensate for her dysphagia. Throat clear noted x 1/6 water boluses likley due to laryngeal penetration to vocal cords.  Pt advised that this is typically what occurs when she has issues with swallowing.   Verbal cued her to clear her throat strongly and cough to clear - which she immediately stated "Well that helped".  Did not conduct 3 ounce Yale due to pt's known mild dysphagia. Pt prefers to take her medications with applesauce for safety.  Recommend continue regular/thin diet with general precautions.  Reviewed with pt advise to strengthen cough and voice for airway protection and anticipate ongoing improvement s/p CVA 05/12/2022.  Marland Kitchen No acute SLP follow up indicated, thanks for this consult. SLP Visit Diagnosis: Dysphagia, oropharyngeal phase (R13.12);Dysphagia, unspecified (R13.10)    Aspiration Risk  Mild aspiration risk    Diet Recommendation Regular;Thin liquid   Liquid Administration via: Cup;No straw Medication Administration: Whole meds with liquid Supervision: Patient able to self feed Compensations: Slow rate;Small sips/bites Postural Changes: Seated upright at 90 degrees    Other  Recommendations   N/a   Recommendations for follow up therapy are one component of a multi-disciplinary discharge planning process, led by the attending physician.  Recommendations may be updated based on patient status, additional functional criteria and insurance authorization.  Follow up Recommendations No SLP follow up      Assistance Recommended at Discharge Frequent or constant Supervision/Assistance  Functional Status Assessment Patient has not had a recent decline in their functional status  Frequency and Duration   N/a         Prognosis   N/a     Swallow Study   General Date of Onset: 06/23/22 HPI: PT is a 71 yo female with medical history significant of typical chest pain right  breast cancer, carotid bruit, diverticulosis/diverticulitis, hypertension, premature atrial contractions, PVCs, bigeminy, seasonal allergies with a recent admission the hospital 05/12/22 due to left sided discomfort - found to have a large subcortical infarct - and now presenting to emergency department due to having a fall at home. Dx R hip fx, s/p percutaneous pinning 06/22/22.  Pt has some difficulties with dysphagia PTA - endorsing metallic taste with anything she eats since her stroke.  Prior MBS in 2023 showed "mild oral deficits with more adequate pharyngeal swallow function. She has reduced labial seal for sucking on a straw, which is improved with placement of the straw on her L side. She had reduced posterior propulsion with the barium tablet, but cleared it from her oral cavity with an additional liquid wash. Her pharyngeal phase was grossly functional and she had good airway protection. Note that the barium tablet appeared to move slowly and then ultimately stop in her distal esophagus (MD not present to confirm)". Swallow evaluation ordered during this hospital coarse. CXR 06/22/2022 negative Type of Study: Bedside Swallow Evaluation Previous Swallow Assessment: recent MBS - see HPI Diet Prior to this Study: Regular;Thin liquids Temperature Spikes Noted: No Respiratory Status: Room air History of Recent Intubation:  (for surgery) Behavior/Cognition: Alert;Cooperative;Pleasant mood Oral Cavity Assessment: Dry Oral Care Completed by SLP: No Oral Cavity - Dentition: Adequate natural dentition  Vision: Functional for self-feeding Self-Feeding Abilities: Able to feed self Patient Positioning: Upright in bed Baseline Vocal Quality: Low vocal intensity Volitional Cough: Weak Volitional Swallow: Able to elicit    Oral/Motor/Sensory Function Overall Oral Motor/Sensory Function: Mild impairment Facial ROM: Reduced right Facial Strength: Reduced right Facial Sensation: Reduced right (lower  trigeminal branch) Lingual ROM: Other (Comment);Suspected CN XII (hypoglossal) dysfunction Lingual Symmetry: Within Functional Limits Lingual Strength: Reduced Lingual Sensation: Reduced Velum: Other (comment) (required significant stimulation to cause elevation)   Ice Chips Ice chips: Not tested   Thin Liquid Thin Liquid: Within functional limits Presentation: Cup;Self Fed Other Comments: Throat clear noted immediately post swallow with 1/6 boluses -  Pt advised that this is typically what occurs when she has issues with swallowing.   Cued her to clear her throat strongly and cough to clear - which she immediately stated "Well that helped".  Did not conduct 3 ounce Yale due to pt's known mild dysphagia.    Nectar Thick Nectar Thick Liquid: Within functional limits Presentation: Self Fed   Honey Thick Honey Thick Liquid: Not tested   Puree Puree: Within functional limits Presentation: Self Fed;Spoon   Solid     Solid: Impaired Presentation: Self Fed Oral Phase Functional Implications: Right lateral sulci pocketing Other Comments: Trace oral pocketing - - Benefiting from verbal cues to clear.      Macario Golds 06/23/2022,3:35 PM  Kathleen Lime, MS [facility] SLP Acute Rehab Services Office [phone] Pager [phone]

## 2022-06-23 NOTE — Plan of Care (Signed)

## 2022-06-23 NOTE — Evaluation (Signed)
Occupational Therapy Evaluation Patient Details Name: Angela Buchanan MRN: [id number] DOB: July 19, 1951 Today's Date: 06/23/2022   History of Present Illness 71 y.o. female with medical history significant of typical chest pain right breast cancer, carotid bruit, diverticulosis/diverticulitis, hypertension, premature atrial contractions, PVCs, bigeminy, seasonal allergies with a recent admission the hospital 05/12/22 due to left pain large subcortical infarct who is coming to the emergency department due to having a fall at home. Dx R hip fx, s/p percutaneous pinning 06/22/22.   Clinical Impression   Pt reported increased R hip soreness and discomfort during progressive activity. She required mod assist for supine to sit and max assist for simulated dressing, with increased time and effort needed for tasks. She gently deferred attempts at sit to stand or other out of bed activity, due to R hip discomfort.  She was noted to be occasionally frustrated regarding her current functional limitations and post-surgical discomfort. Of note, pt was only home from SNF rehab for ~1 week prior to her fall and admission to the hospital this time (she had a recent CVA with resultant R UE and R LE weakness). She will benefit from further OT services to maximize her safety and independence with ADLs.      Recommendations for follow up therapy are one component of a multi-disciplinary discharge planning process, led by the attending physician.  Recommendations may be updated based on patient status, additional functional criteria and insurance authorization.   Follow Up Recommendations  Skilled nursing-short term rehab (<3 hours/day)    Assistance Recommended at Discharge Frequent or constant Supervision/Assistance  Patient can return home with the following A lot of help with bathing/dressing/bathroom;A lot of help with walking and/or transfers;Assist for transportation;Two people to help with walking and/or  transfers;Assistance with cooking/housework    Functional Status Assessment  Patient has had a recent decline in their functional status and demonstrates the ability to make significant improvements in function in a reasonable and predictable amount of time.  Equipment Recommendations          Precautions / Restrictions Precautions Precautions: Fall Precaution Comments: R sided weakness due to recent CVA Restrictions RLE Weight Bearing: Weight bearing as tolerated      Mobility Bed Mobility Overal bed mobility: Needs Assistance Bed Mobility: Supine to Sit     Supine to sit: Mod assist, HOB elevated Sit to supine: Mod assist        Transfers              General transfer comment: unable to assess, as pt gently deferred due to R hip pain      Balance     Sitting balance-Leahy Scale: Good           ADL either performed or assessed with clinical judgement   ADL   Eating/Feeding: Set up Eating/Feeding Details (indicate cue type and reason): based on clinical judgement Grooming: Minimal assistance Grooming Details (indicate cue type and reason): simulated         Upper Body Dressing : Maximal assistance   Lower Body Dressing: Maximal assistance                        Pertinent Vitals/Pain Pain Assessment Pain Assessment: 0-10 Pain Score: 7  Pain Location: right hip Pain Intervention(s): Ice applied, Repositioned, Limited activity within patient's tolerance     Hand Dominance Right   Extremity/Trunk Assessment Upper Extremity Assessment RUE Sensation: WNL   Lower Extremity Assessment Lower Extremity Assessment:  (  L LE AROM WFL. Required AAROM for R knee and hip) RLE: Unable to fully assess due to pain RLE Sensation: WNL       Communication Communication Communication: Expressive difficulties   Cognition Arousal/Alertness: Awake/alert Behavior During Therapy: WFL for tasks assessed/performed Overall Cognitive Status: Within  Functional Limits for tasks assessed             General Comments: Oriented x4, able to follow 1-2 step commands consistently                Home Living Family/patient expects to be discharged to:: Private residence Living Arrangements: Spouse/significant other Available Help at Discharge: Family Type of Home: House   Entrance [place] of Steps: 3 Entrance Stairs-Rails: None Home Layout: One level     Bathroom Shower/Tub: Walk-in shower;Tub/shower unit             Additional Comments: hemiwalker      Prior Functioning/Environment               Mobility Comments: Was walking with hemiwalker at home PTA; had only been home from short-term SNF for 1 week PTA (had a recent CVA) ADLs Comments: Prior to CVA a couple weeks ago, she was independent with ADLs, driving, cooking, and cleaning.        OT Problem List: Decreased strength;Decreased range of motion;Decreased activity tolerance;Impaired balance (sitting and/or standing);Decreased coordination;Impaired UE functional use;Pain      OT Treatment/Interventions: Self-care/ADL training;Therapeutic exercise;Neuromuscular education;Energy conservation;Manual therapy;Balance training;Patient/family education;Visual/perceptual remediation/compensation;DME and/or AE instruction;Therapeutic activities    OT Goals(Current goals can be found in the care plan section) Acute Rehab OT Goals Patient Stated Goal: To get better and return to desired activities OT Goal Formulation: With patient Time For Goal Achievement: 07/07/22 Potential to Achieve Goals: Good ADL Goals Pt Will Perform Grooming: with set-up Pt Will Perform Upper Body Dressing: with supervision Pt Will Perform Lower Body Dressing: with min guard assist Pt Will Transfer to Toilet: with min guard assist;stand pivot transfer Pt Will Perform Toileting - Clothing Manipulation and hygiene: with min guard assist;sit to/from stand  OT Frequency: Min  2X/week       AM-PAC OT "6 Clicks" Daily Activity     Outcome Measure Help from another person eating meals?: A Little Help from another person taking care of personal grooming?: A Little Help from another person toileting, which includes using toliet, bedpan, or urinal?: A Lot Help from another person bathing (including washing, rinsing, drying)?: A Lot Help from another person to put on and taking off regular upper body clothing?: A Lot Help from another person to put on and taking off regular lower body clothing?: A Lot 6 Click Score: 14   End of Session Nurse Communication:  (Nurse cleared pt for participation in therapy)  Activity Tolerance: Patient limited by pain Patient left: in bed;with bed alarm set;with call bell/phone within reach;with family/visitor present  OT Visit Diagnosis: Pain;Muscle weakness (generalized) (M62.81) Pain - Right/Left: Right Pain - part of body: Hip                Time: 2751-7001 OT Time Calculation (min): 28 min Charges:  OT Evaluation $OT Eval Low Complexity: 1 Low $OT Eval Moderate Complexity: 1 Mod OT Treatments $Therapeutic Activity: 8-22 mins   Leota Sauers, OTR/L 06/23/2022, 5:36 PM

## 2022-06-24 DIAGNOSIS — S72001D Fracture of unspecified part of neck of right femur, subsequent encounter for closed fracture with routine healing: Secondary | ICD-10-CM | POA: Diagnosis not present

## 2022-06-24 DIAGNOSIS — I10 Essential (primary) hypertension: Secondary | ICD-10-CM | POA: Diagnosis not present

## 2022-06-24 DIAGNOSIS — E876 Hypokalemia: Secondary | ICD-10-CM | POA: Diagnosis not present

## 2022-06-24 LAB — CBC WITH DIFFERENTIAL/PLATELET
Abs Immature Granulocytes: 0.02 10*3/uL (ref 0.00–0.07)
Basophils Absolute: 0 10*3/uL (ref 0.0–0.1)
Basophils Relative: 1 %
Eosinophils Absolute: 0.8 10*3/uL — ABNORMAL HIGH (ref 0.0–0.5)
Eosinophils Relative: 12 %
HCT: 30 % — ABNORMAL LOW (ref 36.0–46.0)
Hemoglobin: 10.1 g/dL — ABNORMAL LOW (ref 12.0–15.0)
Immature Granulocytes: 0 %
Lymphocytes Relative: 16 %
Lymphs Abs: 0.9 10*3/uL (ref 0.7–4.0)
MCH: 34.5 pg — ABNORMAL HIGH (ref 26.0–34.0)
MCHC: 33.7 g/dL (ref 30.0–36.0)
MCV: 102.4 fL — ABNORMAL HIGH (ref 80.0–100.0)
Monocytes Absolute: 0.6 10*3/uL (ref 0.1–1.0)
Monocytes Relative: 10 %
Neutro Abs: 3.7 10*3/uL (ref 1.7–7.7)
Neutrophils Relative %: 61 %
Platelets: 395 10*3/uL (ref 150–400)
RBC: 2.93 MIL/uL — ABNORMAL LOW (ref 3.87–5.11)
RDW: 13.2 % (ref 11.5–15.5)
WBC: 6 10*3/uL (ref 4.0–10.5)
nRBC: 0 % (ref 0.0–0.2)

## 2022-06-24 LAB — BASIC METABOLIC PANEL
Anion gap: 6 (ref 5–15)
BUN: 13 mg/dL (ref 8–23)
CO2: 21 mmol/L — ABNORMAL LOW (ref 22–32)
Calcium: 8.5 mg/dL — ABNORMAL LOW (ref 8.9–10.3)
Chloride: 112 mmol/L — ABNORMAL HIGH (ref 98–111)
Creatinine, Ser: 0.59 mg/dL (ref 0.44–1.00)
GFR, Estimated: >60 mL/min (ref >60)
Glucose, Bld: 115 mg/dL — ABNORMAL HIGH (ref 70–99)
Potassium: 3.9 mmol/L (ref 3.5–5.1)
Sodium: 139 mmol/L (ref 135–145)

## 2022-06-24 MED ORDER — MORPHINE SULFATE (PF) 2 MG/ML IV SOLN: 0.5000 mg | Freq: Two times a day (BID) | INTRAVENOUS | Status: DC | PRN

## 2022-06-24 MED ORDER — ACETAMINOPHEN 500 MG PO TABS
500.0000 mg | ORAL_TABLET | Freq: Three times a day (TID) | ORAL | Status: DC
  Administered 2022-06-24 – 2022-06-26 (×6): 500 mg via ORAL
  Filled 2022-06-24 (×6): qty 1

## 2022-06-24 NOTE — Progress Notes (Addendum)
PROGRESS NOTE    Angela Buchanan  EQA:[id number] DOB: 04-08-1951 DOA: 06/21/2022 PCP: Glenis Smoker, MD    Chief Complaint  Patient presents with   Fall   Hip Pain    Brief Narrative:   Angela Buchanan is a 71 y.o. female with medical history significant of typical chest pain right breast cancer, carotid bruit, diverticulosis/diverticulitis, hypertension, premature atrial contractions, PVCs, bigeminy, seasonal allergies who was recently discharged from the hospital due to left pain large subcortical infarct who is coming to the emergency department due to having a fall at home.   Right hip x-ray shows right femoral neck fracture.  CT of the right hip also showed small ill-defined hematoma seen in the subcutaneous soft tissues at the lateral aspect of the right hip and proximal thigh.  She underwent surgical repair, and is currently stable for discharge.   Assessment & Plan:   Principal Problem:   Closed fracture of femur, neck (HCC) Active Problems:   Essential hypertension   Pure hypercholesterolemia   CVA (cerebral vascular accident) ([place])   Urinary frequency   Hypokalemia   Hypothyroidism   Malnutrition of moderate degree   Right femoral neck fracture Patient underwent Right hip closed reduction percutaneous pinning by Dr Griffin Basil. Pain control and therapy evaluations recommending snf.  Currently waiting for SNF placement.     Hypertension; BP parameters are well controlled.     Hypothyroidism:  Resume synthroid.    Hyperlipidemia:  Resume statin.     Hypokalemia:  Replaced and recheck levels in am.    Macrocytic anemia: / ACUTE ANEMIA OF BLOOD LOSS from the procedure.  Anemia panel shows iron deficiency.  Hemoglobin dropped from 12.2 on admission to 9.6  and stabilized at 10.1 Transfuse to keep hemoglobin greater than 7.  Continue to  monitor as CT hip show ill-defined hematoma seen in the subcutaneous soft tissues at the lateral aspect of the right  hip and proximal thigh.    H/o recent CVA With right hemiparesis.  Right upper extremity weakness persistent.    DVT prophylaxis: Lovenox  Code Status: full code.  Family Communication: None at bedside.  Disposition:   Status is: Inpatient Remains inpatient appropriate because: SNF placement.    Level of care: Telemetry Consultants:  Orthopedics.   Procedures: none.   Antimicrobials: none.   Subjective: Pain better , wants to sit in the chair.    Objective: Vitals:   06/24/22 0449 06/24/22 0700 06/24/22 0900 06/24/22 1314  BP: (!) 136/57 121/62 (!) 144/51 136/68  Pulse: 74 68  77  Resp: '18 17  18  '$ Temp: 98.8 F (37.1 C) 98.3 F (36.8 C)  (!) 97.5 F (36.4 C)  TempSrc:  Oral  Oral  SpO2: 97% 97%  98%  Weight:      Height:        Intake/Output Summary (Last 24 hours) at 06/24/2022 1336 Last data filed at 06/24/2022 1151 Gross per 24 hour  Intake 320 ml  Output 1800 ml  Net -1480 ml    Filed Weights   06/22/22 0050  Weight: 49.4 kg    Examination:  General exam: Appears calm and comfortable  Respiratory system: Clear to auscultation. Respiratory effort normal. Cardiovascular system: S1 & S2 heard, RRR. No JVD, No pedal edema. Gastrointestinal system: Abdomen is nondistended, soft and nontender. Central nervous system: Alert and oriented. Right hemiparesis.  Extremities: Symmetric 5 x 5 power. Skin: No rashes, lesions or ulcers Psychiatry:  Mood & affect appropriate.  Data Reviewed: I have personally reviewed following labs and imaging studies  CBC: Recent Labs  Lab 06/21/22 1231 06/22/22 0412 06/23/22 0409 06/24/22 0305  WBC 5.5 8.6 8.8 6.0  NEUTROABS 4.3  --  7.4 3.7  HGB 12.2 11.0* 9.6* 10.1*  HCT 35.8* 32.1* 29.0* 30.0*  MCV 101.4* 101.3* 103.9* 102.4*  PLT 435* 406* 369 395     Basic Metabolic Panel: Recent Labs  Lab 06/21/22 1231 06/21/22 1815 06/22/22 0412 06/23/22 0409 06/24/22 0305  NA 141  --  138 134* 139  K  2.4* 2.9* 3.4* 3.2* 3.9  CL 110  --  112* 108 112*  CO2 22  --  21* 19* 21*  GLUCOSE 106*  --  153* 161* 115*  BUN 12  --  '13 12 13  '$ CREATININE 0.76  --  0.71 0.71 0.59  CALCIUM 9.2  --  9.1 8.2* 8.5*  MG 2.0  --   --  1.9  --      GFR: Estimated Creatinine Clearance: 49.6 mL/min (by C-G formula based on SCr of 0.59 mg/dL).  Liver Function Tests: Recent Labs  Lab 06/22/22 0412  AST 22  ALT 20  ALKPHOS 61  BILITOT 0.5  PROT 6.0*  ALBUMIN 3.5     CBG: No results for input(s): "GLUCAP" in the last 168 hours.   Recent Results (from the past 240 hour(s))  Surgical pcr screen     Status: None   Collection Time: 06/21/22  4:42 PM   Specimen: Nasal Mucosa; Nasal Swab  Result Value Ref Range Status   MRSA, PCR NEGATIVE NEGATIVE Final   Staphylococcus aureus NEGATIVE NEGATIVE Final    Comment: (NOTE) The Xpert SA Assay (FDA approved for NASAL specimens in patients 70 years of age and older), is one component of a comprehensive surveillance program. It is not intended to diagnose infection nor to guide or monitor treatment. Performed at [facility], [place] [address], [place], [place] [address]          Radiology Studies: DG Hip Port Unilat With Pelvis 1V Right  Result Date: 06/22/2022 CLINICAL DATA:  Postoperative right hip EXAM: DG HIP (WITH OR WITHOUT PELVIS) 1V PORT RIGHT COMPARISON:  Right hip radiographs 06/21/2022; CT right hip 06/21/2022 FINDINGS: There are 3 screws now fixating the previously seen proximal right femoral neck fracture. Minimal lateral cortical step-off but otherwise normal alignment on the provided two views. No evidence of hardware failure. Mild superior left femoroacetabular joint space narrowing. Mild pubic symphysis joint space narrowing. IMPRESSION: Interval ORIF of the previously seen right femoral neck fracture with near anatomic alignment. Electronically Signed   By: Yvonne Kendall M.D.   On: 06/22/2022 14:25         Scheduled Meds:  amLODipine  5 mg Oral Daily   aspirin EC  81 mg Oral Daily   clopidogrel  75 mg Oral Daily   docusate sodium  100 mg Oral BID   feeding supplement  237 mL Oral BID BM   ferrous sulfate  325 mg Oral BID WC   levothyroxine  25 mcg Oral Q0600   lisinopril  40 mg Oral Daily   metoprolol tartrate  25 mg Oral QPM   multivitamin with minerals  1 tablet Oral Daily   pantoprazole  40 mg Oral Q1200   pravastatin  40 mg Oral Daily   senna  2 tablet Oral QHS   Continuous Infusions:  methocarbamol (ROBAXIN) IV Stopped (06/22/22 1449)  LOS: 3 days        Hosie Poisson, MD [facility]   To contact the attending provider between 7A-7P or the covering provider during after hours 7P-7A, please log into the web site [email or web address] and access using universal [place] password for that web site. If you do not have the password, please call the hospital operator.  06/24/2022, 1:36 PM

## 2022-06-24 NOTE — Progress Notes (Signed)
Orthopaedic Trauma Service Progress Note  Patient ID: Angela Buchanan MRN: [id number] DOB/AGE: 1951/02/13 71 y.o.  Subjective:  Doing well Pain controlled Has worked with therapies   Clinical picture complicated by the fact that she suffered a stroke 05/2022 with R sided deficits Prior to stroke she was an independent Ambulator Got tripped up using hemi walker  Just started prolia for osteoporosis    ROS As above  Objective:   VITALS:   Vitals:   06/24/22 0449 06/24/22 0700 06/24/22 0900 06/24/22 1314  BP: (!) 136/57 121/62 (!) 144/51 136/68  Pulse: 74 68  77  Resp: '18 17  18  '$ Temp: 98.8 F (37.1 C) 98.3 F (36.8 C)  (!) 97.5 F (36.4 C)  TempSrc:  Oral  Oral  SpO2: 97% 97%  98%  Weight:      Height:        Estimated body mass index is 19.92 kg/m as calculated from the following:   Height as of this encounter: '5\' 2"'$  (1.575 m).   Weight as of this encounter: 49.4 kg.   Intake/Output      09/22 0701 09/23 0700 09/23 0701 09/24 0700   P.O. 200 120   I.V. (mL/kg)     IV Piggyback  0   Total Intake(mL/kg) 200 (4) 120 (2.4)   Urine (mL/kg/hr) 1600 (1.3) 200 (0.6)   Emesis/NG output     Stool     Blood     Total Output 1600 200   Net -1400 -80          LABS  Results for orders placed or performed during the hospital encounter of 06/21/22 (from the past 24 hour(s))  CBC with Differential/Platelet     Status: Abnormal   Collection Time: 06/24/22  3:05 AM  Result Value Ref Range   WBC 6.0 4.0 - 10.5 K/uL   RBC 2.93 (L) 3.87 - 5.11 MIL/uL   Hemoglobin 10.1 (L) 12.0 - 15.0 g/dL   HCT 30.0 (L) 36.0 - 46.0 %   MCV 102.4 (H) 80.0 - 100.0 fL   MCH 34.5 (H) 26.0 - 34.0 pg   MCHC 33.7 30.0 - 36.0 g/dL   RDW 13.2 11.5 - 15.5 %   Platelets 395 150 - 400 K/uL   nRBC 0.0 0.0 - 0.2 %   Neutrophils Relative % 61 %   Neutro Abs 3.7 1.7 - 7.7 K/uL   Lymphocytes Relative 16 %   Lymphs Abs 0.9  0.7 - 4.0 K/uL   Monocytes Relative 10 %   Monocytes Absolute 0.6 0.1 - 1.0 K/uL   Eosinophils Relative 12 %   Eosinophils Absolute 0.8 (H) 0.0 - 0.5 K/uL   Basophils Relative 1 %   Basophils Absolute 0.0 0.0 - 0.1 K/uL   Immature Granulocytes 0 %   Abs Immature Granulocytes 0.02 0.00 - 0.07 K/uL  Basic metabolic panel     Status: Abnormal   Collection Time: 06/24/22  3:05 AM  Result Value Ref Range   Sodium 139 135 - 145 mmol/L   Potassium 3.9 3.5 - 5.1 mmol/L   Chloride 112 (H) 98 - 111 mmol/L   CO2 21 (L) 22 - 32 mmol/L   Glucose, Bld 115 (H) 70 - 99 mg/dL   BUN 13 8 - 23 mg/dL   Creatinine,  Ser 0.59 0.44 - 1.00 mg/dL   Calcium 8.5 (L) 8.9 - 10.3 mg/dL   GFR, Estimated >60 >60 mL/min   Anion gap 6 5 - 15     PHYSICAL EXAM:   Gen: pleasant, husband at bedside, NAD, eating lunch Lungs: unlabored Ext:       Right Lower Extremity   Aquacel dressing to R hip clean and dry   Palpable DP pulse  No significant swelling  DPN, SPN, TN sensory function intact  Demonstrates good EHL FHL motor function.  Ankle flexion and extension are intact 4+ out of 5  Demonstrates some quad and hamstring weakness compared to the contralateral side likely residual from her stroke but patient reports she has made tremendous improvement since her stroke    No pain with axial loading of her hip     Assessment/Plan: 2 Days Post-Op   Principal Problem:   Closed fracture of femur, neck ([place]) Active Problems:   Essential hypertension   Pure hypercholesterolemia   CVA (cerebral vascular accident) ([place])   Urinary frequency   Hypokalemia   Hypothyroidism   Malnutrition of moderate degree   Anti-infectives (From admission, onward)    Start     Dose/Rate Route Frequency Ordered Stop   06/22/22 1800  ceFAZolin (ANCEF) IVPB 2g/100 mL premix        2 g 200 mL/hr over 30 Minutes Intravenous Every 6 hours 06/22/22 1532 06/22/22 2316   06/22/22 1305  vancomycin (VANCOCIN) powder  Status:   Discontinued          As needed 06/22/22 1305 06/22/22 1524   06/22/22 1200  ceFAZolin (ANCEF) IVPB 2g/100 mL premix        2 g 200 mL/hr over 30 Minutes Intravenous On call to O.R. 06/22/22 1113 06/22/22 1246   06/22/22 0600  ceFAZolin (ANCEF) IVPB 2g/100 mL premix  Status:  Discontinued        2 g 200 mL/hr over 30 Minutes Intravenous On call to O.R. 06/21/22 1453 06/22/22 1111     .  POD/HD#:   71 year old female ground-level fall with impacted right femoral neck fracture.  History of stroke August 2023  -Fall with right hip fragility fracture  -Impacted right femoral neck fracture s/p percutaneous fixation  Weight-bear as tolerated  Range of motion as tolerated  Would favor traditional walker with or without platform for right upper extremity weakness from stroke over hemiwalker which she was using and resulted in her fall   Continue with therapies  Reinforce dressing as needed   Ortho issues are stable  - Pain management:  Multimodal  Minimize narcotics   Scheduled Tylenol  And changed her iv morphine to every 12 hours for severe pain only  - ABL anemia/Hemodynamics  Stable  - Medical issues   Per primary  - DVT/PE prophylaxis:  Aspirin and Plavix  - Metabolic Bone Disease:  Fracture is a fragility fracture, patient with known osteoporosis  Just started Prolia.  Would continue this  Follow-up with her oncology team who has been managing.    vitamin D levels look okay   Continue with home regimen   Recommend 4000 to 5000 IUs of vitamin D3 daily  - Activity:  As above  - Impediments to fracture healing:  Osteoporosis  - Dispo:  Ortho issues stable  TOC consult for SNF   Patient would like [facility] as this is where she convalesced following her stroke    Jari Pigg, PA-C [phone] (C) 06/24/2022, 2:12 PM  [facility] [place] [phone] Jenetta [phone] (F)    After 5pm and on the  weekends please log on to Amion, go to orthopaedics and the look under the [facility] Call for the provider(s) on call. You can also call our office at [phone] and then follow the prompts to be connected to the call team.   Patient ID: Angela Buchanan, female   DOB: 1950/10/04, 71 y.o.   MRN: [id number]

## 2022-06-24 NOTE — Plan of Care (Signed)
  Problem: Education: Goal: Knowledge of General Education information will improve Description Including pain rating scale, medication(s)/side effects and non-pharmacologic comfort measures Outcome: Progressing   

## 2022-06-24 NOTE — Progress Notes (Signed)
Physical Therapy Treatment Patient Details Name: Angela Buchanan MRN: [id number] DOB: 1950-12-24 Today's Date: 06/24/2022   History of Present Illness 71 y.o. female with medical history significant of typical chest pain right breast cancer, carotid bruit, diverticulosis/diverticulitis, hypertension, premature atrial contractions, PVCs, bigeminy, seasonal allergies with a recent admission the hospital 05/12/22 due to left pain large subcortical infarct who is coming to the emergency department due to having a fall at home. Dx R hip fx, s/p percutaneous pinning 06/22/22.    PT Comments    POD # 2 General Comments: AxO x 3 very pleasant lady.  Assisted OOB to amb was very difficult.  General bed mobility comments: great difficutly self performing due to R hip pain and R UE non functional.  Utilizes bed pad to complete scooting to EOB. General transfer comment: required + 2 side by side assist to rise before placing R UE onto walker platform attatchment. General Gait Details: Very limited amb distance of 5 feet using R platform walker and + 2 assist as well as recliner following.  Very unsteady.  HIGH FALL RISK. Pt will need ST Rehab at SNF to address mobility and functional decline prior to safely returning home.   Recommendations for follow up therapy are one component of a multi-disciplinary discharge planning process, led by the attending physician.  Recommendations may be updated based on patient status, additional functional criteria and insurance authorization.  Follow Up Recommendations  Skilled nursing-short term rehab (<3 hours/day) Can patient physically be transported by private vehicle: No   Assistance Recommended at Discharge Frequent or constant Supervision/Assistance  Patient can return home with the following A lot of help with walking and/or transfers;A lot of help with bathing/dressing/bathroom;Assistance with cooking/housework;Assist for transportation;Help with stairs or ramp for  entrance   Equipment Recommendations  None recommended by PT    Recommendations for Other Services       Precautions / Restrictions Precautions Precautions: Fall Precaution Comments: R sided weakness due to recent CVA Restrictions Weight Bearing Restrictions: No RLE Weight Bearing: Weight bearing as tolerated     Mobility  Bed Mobility Overal bed mobility: Needs Assistance Bed Mobility: Supine to Sit     Supine to sit: Mod assist, HOB elevated, Max assist, +2 for physical assistance, +2 for safety/equipment     General bed mobility comments: great difficutly self performing due to R hip pain and R UE non functional.  Utilizes bed pad to complete scooting to EOB.    Transfers Overall transfer level: Needs assistance Equipment used: None Transfers: Sit to/from Stand Sit to Stand: Min assist, Mod assist, +2 physical assistance, +2 safety/equipment           General transfer comment: required + 2 side by side assist to rise before placing R UE onto walker platform attatchment.    Ambulation/Gait Ambulation/Gait assistance: Max assist, +2 physical assistance, +2 safety/equipment Gait Distance (Feet): 5 Feet Assistive device: Rolling walker (2 wheels), Right platform walker Gait Pattern/deviations: Step-to pattern, Decreased stance time - right, Narrow base of support Gait velocity: decreased     General Gait Details: Very limited amb distance of 5 feet using R platform walker and + 2 assist as well as recliner following.  Very unsteady.  HIGH FALL RISK.   Stairs             Wheelchair Mobility    Modified Rankin (Stroke Patients Only)       Balance  Cognition Arousal/Alertness: Awake/alert   Overall Cognitive Status: Within Functional Limits for tasks assessed                                 General Comments: AxO x 3 very pleasant lady        Exercises       General Comments        Pertinent Vitals/Pain Pain Assessment Pain Assessment: Faces Faces Pain Scale: Hurts little more Pain Location: right hip Pain Descriptors / Indicators: Sore, Grimacing, Operative site guarding Pain Intervention(s): Monitored during session, Premedicated before session, Repositioned, Ice applied    Home Living                          Prior Function            PT Goals (current goals can now be found in the care plan section) Progress towards PT goals: Progressing toward goals    Frequency    7X/week      PT Plan Current plan remains appropriate    Co-evaluation              AM-PAC PT "6 Clicks" Mobility   Outcome Measure  Help needed turning from your back to your side while in a flat bed without using bedrails?: A Lot Help needed moving from lying on your back to sitting on the side of a flat bed without using bedrails?: A Lot Help needed moving to and from a bed to a chair (including a wheelchair)?: A Lot Help needed standing up from a chair using your arms (e.g., wheelchair or bedside chair)?: A Lot Help needed to walk in hospital room?: Total Help needed climbing 3-5 steps with a railing? : Total 6 Click Score: 10    End of Session Equipment Utilized During Treatment: Gait belt Activity Tolerance: Patient limited by pain Patient left: in chair;with chair alarm set;with call bell/phone within reach Nurse Communication: Mobility status PT Visit Diagnosis: Muscle weakness (generalized) (M62.81);Difficulty in walking, not elsewhere classified (R26.2);Pain;History of falling (Z91.81) Pain - Right/Left: Right Pain - part of body: Hip     Time: 4627-0350 PT Time Calculation (min) (ACUTE ONLY): 28 min  Charges:  $Gait Training: 8-22 mins $Therapeutic Activity: 8-22 mins                     Rica Koyanagi  PTA [place] Office M-F          [phone] Weekend pager [phone]

## 2022-06-24 NOTE — TOC Progression Note (Signed)
Transition of Care [facility]) - Progression Note    Patient Details  Name: Angela Buchanan MRN: [id number] Date of Birth: Nov 26, 1950  [facility] CM/SW Contact  Ross Ludwig, [place] Phone Number: 06/24/2022, 5:09 PM  Clinical Narrative:     CSW spoke to patient's husband to discuss SNF options.  Per patient's husband they would like to return to [facility] if possible.  CSW explained to patient's husband that CSW will send information to [facility] and other facilities in case there are not any beds.  Patient's husband agreed to allow other facilities to receive the referral.  CSW explained how insurance will pay for stay, and per patient and husband she was at other facility for approximately 4.5 weeks and was home for a week.  Patient's husband was explained that patient may be copay days, and he is aware.  CSW also explained that insurance will have to approve in order to go to SNF.  CSW to begin bed search   Expected Discharge Plan: [place] Barriers to Discharge: Continued Medical Work up, Ship broker, SNF Pending bed offer  Expected Discharge Plan and Services Expected Discharge Plan: [place] In-house Referral: Clinical Social Work   Post Acute Care Choice: [place] (prefer [facility] if possible) Living arrangements for the past 2 months: Single Family Home                 DME Arranged: N/A DME Agency: NA                   Social Determinants of Health (SDOH) Interventions    Readmission Risk Interventions    06/23/2022    2:19 PM  Readmission Risk Prevention Plan  Transportation Screening Complete  PCP or Specialist Appt within 5-7 Days Complete  Home Care Screening Complete  Medication Review (RN CM) Complete

## 2022-06-25 DIAGNOSIS — E876 Hypokalemia: Secondary | ICD-10-CM | POA: Diagnosis not present

## 2022-06-25 DIAGNOSIS — I10 Essential (primary) hypertension: Secondary | ICD-10-CM | POA: Diagnosis not present

## 2022-06-25 DIAGNOSIS — S72001D Fracture of unspecified part of neck of right femur, subsequent encounter for closed fracture with routine healing: Secondary | ICD-10-CM | POA: Diagnosis not present

## 2022-06-25 NOTE — Progress Notes (Signed)
PROGRESS NOTE    Angela Buchanan  DJS:[id number] DOB: December 15, 1950 DOA: 06/21/2022 PCP: Glenis Smoker, MD    Chief Complaint  Patient presents with   Fall   Hip Pain    Brief Narrative:   Angela Buchanan is a 71 y.o. female with medical history significant of typical chest pain right breast cancer, carotid bruit, diverticulosis/diverticulitis, hypertension, premature atrial contractions, PVCs, bigeminy, seasonal allergies who was recently discharged from the hospital due to left pain large subcortical infarct who is coming to the emergency department due to having a fall at home.   Right hip x-ray shows right femoral neck fracture.  CT of the right hip also showed small ill-defined hematoma seen in the subcutaneous soft tissues at the lateral aspect of the right hip and proximal thigh.  She underwent surgical repair, and is currently stable for discharge.   Assessment & Plan:   Principal Problem:   Closed fracture of femur, neck (HCC) Active Problems:   Essential hypertension   Pure hypercholesterolemia   CVA (cerebral vascular accident) ([place])   Urinary frequency   Hypokalemia   Hypothyroidism   Malnutrition of moderate degree   Right femoral neck fracture Patient underwent Right hip closed reduction percutaneous pinning by Dr Griffin Basil. Pain control and therapy evaluations recommending snf.  Currently waiting for SNF placement.  No new complaints.     Hypertension; BP parameters are well controlled.     Hypothyroidism:  Resume synthroid.    Hyperlipidemia:  Resume statin.     Hypokalemia:  Replaced.    Macrocytic anemia: / ACUTE ANEMIA OF BLOOD LOSS from the procedure.  Anemia panel shows iron deficiency.  Hemoglobin dropped from 12.2 on admission to 9.6  and stabilized at 10.1 Transfuse to keep hemoglobin greater than 7.  Continue to  monitor as CT hip show ill-defined hematoma seen in the subcutaneous soft tissues at the lateral aspect of the right hip  and proximal thigh.    H/o recent CVA With right hemiparesis.  Right upper extremity weakness persistent.  Therapy eval recommending SNF.    DVT prophylaxis: Lovenox  Code Status: full code.  Family Communication: None at bedside.  Disposition:   Status is: Inpatient Remains inpatient appropriate because: SNF placement.    Level of care: Telemetry Consultants:  Orthopedics.   Procedures: none.   Antimicrobials: none.   Subjective: No new complaints.    Objective: Vitals:   06/24/22 2112 06/25/22 0500 06/25/22 0818 06/25/22 1320  BP: (!) 147/71 (!) 146/66 (!) 144/69 127/60  Pulse: 71 67 70 78  Resp: '16 16 18 20  '$ Temp: 98.2 F (36.8 C) (!) 97.5 F (36.4 C) 97.8 F (36.6 C) 97.7 F (36.5 C)  TempSrc: Oral Oral Oral   SpO2: 99% 98% 99% 98%  Weight:      Height:        Intake/Output Summary (Last 24 hours) at 06/25/2022 1706 Last data filed at 06/25/2022 1400 Gross per 24 hour  Intake 1200 ml  Output 2050 ml  Net -850 ml    Filed Weights   06/22/22 0050  Weight: 49.4 kg    Examination:  General exam: Appears calm and comfortable  Respiratory system: Clear to auscultation. Respiratory effort normal. Cardiovascular system: S1 & S2 heard, RRR. No JVD,  No pedal edema. Gastrointestinal system: Abdomen is nondistended, soft and nontender. Normal bowel sounds heard. Central nervous system: Alert and oriented. No focal neurological deficits. Extremities: painful ROM on the right.  Skin: No rashes, lesions  or ulcers Psychiatry: Mood & affect appropriate.       Data Reviewed: I have personally reviewed following labs and imaging studies  CBC: Recent Labs  Lab 06/21/22 1231 06/22/22 0412 06/23/22 0409 06/24/22 0305  WBC 5.5 8.6 8.8 6.0  NEUTROABS 4.3  --  7.4 3.7  HGB 12.2 11.0* 9.6* 10.1*  HCT 35.8* 32.1* 29.0* 30.0*  MCV 101.4* 101.3* 103.9* 102.4*  PLT 435* 406* 369 395     Basic Metabolic Panel: Recent Labs  Lab 06/21/22 1231  06/21/22 1815 06/22/22 0412 06/23/22 0409 06/24/22 0305  NA 141  --  138 134* 139  K 2.4* 2.9* 3.4* 3.2* 3.9  CL 110  --  112* 108 112*  CO2 22  --  21* 19* 21*  GLUCOSE 106*  --  153* 161* 115*  BUN 12  --  '13 12 13  '$ CREATININE 0.76  --  0.71 0.71 0.59  CALCIUM 9.2  --  9.1 8.2* 8.5*  MG 2.0  --   --  1.9  --      GFR: Estimated Creatinine Clearance: 49.6 mL/min (by C-G formula based on SCr of 0.59 mg/dL).  Liver Function Tests: Recent Labs  Lab 06/22/22 0412  AST 22  ALT 20  ALKPHOS 61  BILITOT 0.5  PROT 6.0*  ALBUMIN 3.5     CBG: No results for input(s): "GLUCAP" in the last 168 hours.   Recent Results (from the past 240 hour(s))  Surgical pcr screen     Status: None   Collection Time: 06/21/22  4:42 PM   Specimen: Nasal Mucosa; Nasal Swab  Result Value Ref Range Status   MRSA, PCR NEGATIVE NEGATIVE Final   Staphylococcus aureus NEGATIVE NEGATIVE Final    Comment: (NOTE) The Xpert SA Assay (FDA approved for NASAL specimens in patients 64 years of age and older), is one component of a comprehensive surveillance program. It is not intended to diagnose infection nor to guide or monitor treatment. Performed at [facility], [place] [address], [place], [place] [address]          Radiology Studies: No results found.      Scheduled Meds:  acetaminophen  500 mg Oral Q8H   amLODipine  5 mg Oral Daily   aspirin EC  81 mg Oral Daily   clopidogrel  75 mg Oral Daily   docusate sodium  100 mg Oral BID   feeding supplement  237 mL Oral BID BM   ferrous sulfate  325 mg Oral BID WC   levothyroxine  25 mcg Oral Q0600   lisinopril  40 mg Oral Daily   metoprolol tartrate  25 mg Oral QPM   multivitamin with minerals  1 tablet Oral Daily   pantoprazole  40 mg Oral Q1200   pravastatin  40 mg Oral Daily   senna  2 tablet Oral QHS   Continuous Infusions:  methocarbamol (ROBAXIN) IV Stopped (06/22/22 1449)     LOS: 4 days         Hosie Poisson, MD [facility]   To contact the attending provider between 7A-7P or the covering provider during after hours 7P-7A, please log into the web site [email or web address] and access using universal [place] password for that web site. If you do not have the password, please call the hospital operator.  06/25/2022, 5:06 PM

## 2022-06-25 NOTE — TOC Progression Note (Signed)
Transition of Care [facility]) - Progression Note    Patient Details  Name: Angela Buchanan MRN: [id number] Date of Birth: 03/06/1951  Transition of [facility]) CM/SW Contact  Ross Ludwig, [place] Phone Number: 06/25/2022, 5:25 PM  Clinical Narrative:     Patient should be able to go to [facility] SNF tomorrow pending insurance authorization.  Insurance Josem Kaufmann [id number], CSW awaiting for insurance authorization for patient to go to SNF.   Expected Discharge Plan: [place] Barriers to Discharge: Continued Medical Work up, Ship broker, SNF Pending bed offer  Expected Discharge Plan and Services Expected Discharge Plan: [place] In-house Referral: Clinical Social Work   Post Acute Care Choice: [place] (prefer [facility] if possible) Living arrangements for the past 2 months: Single Family Home                 DME Arranged: N/A DME Agency: NA                   Social Determinants of Health (SDOH) Interventions    Readmission Risk Interventions    06/23/2022    2:19 PM  Readmission Risk Prevention Plan  Transportation Screening Complete  PCP or Specialist Appt within 5-7 Days Complete  Home Care Screening Complete  Medication Review (RN CM) Complete

## 2022-06-25 NOTE — Plan of Care (Signed)
Progressing

## 2022-06-26 DIAGNOSIS — I639 Cerebral infarction, unspecified: Secondary | ICD-10-CM | POA: Diagnosis not present

## 2022-06-26 DIAGNOSIS — D649 Anemia, unspecified: Secondary | ICD-10-CM | POA: Diagnosis not present

## 2022-06-26 DIAGNOSIS — C50411 Malignant neoplasm of upper-outer quadrant of right female breast: Secondary | ICD-10-CM | POA: Diagnosis not present

## 2022-06-26 DIAGNOSIS — S50311A Abrasion of right elbow, initial encounter: Secondary | ICD-10-CM | POA: Diagnosis not present

## 2022-06-26 DIAGNOSIS — D75839 Thrombocytosis, unspecified: Secondary | ICD-10-CM | POA: Diagnosis not present

## 2022-06-26 DIAGNOSIS — K5900 Constipation, unspecified: Secondary | ICD-10-CM | POA: Diagnosis not present

## 2022-06-26 DIAGNOSIS — Z7401 Bed confinement status: Secondary | ICD-10-CM | POA: Diagnosis not present

## 2022-06-26 DIAGNOSIS — R471 Dysarthria and anarthria: Secondary | ICD-10-CM | POA: Diagnosis not present

## 2022-06-26 DIAGNOSIS — R051 Acute cough: Secondary | ICD-10-CM | POA: Diagnosis not present

## 2022-06-26 DIAGNOSIS — S60222A Contusion of left hand, initial encounter: Secondary | ICD-10-CM | POA: Diagnosis not present

## 2022-06-26 DIAGNOSIS — R531 Weakness: Secondary | ICD-10-CM | POA: Diagnosis not present

## 2022-06-26 DIAGNOSIS — E876 Hypokalemia: Secondary | ICD-10-CM | POA: Diagnosis not present

## 2022-06-26 DIAGNOSIS — M6281 Muscle weakness (generalized): Secondary | ICD-10-CM | POA: Diagnosis not present

## 2022-06-26 DIAGNOSIS — J019 Acute sinusitis, unspecified: Secondary | ICD-10-CM | POA: Diagnosis not present

## 2022-06-26 DIAGNOSIS — N182 Chronic kidney disease, stage 2 (mild): Secondary | ICD-10-CM | POA: Diagnosis not present

## 2022-06-26 DIAGNOSIS — E785 Hyperlipidemia, unspecified: Secondary | ICD-10-CM | POA: Diagnosis not present

## 2022-06-26 DIAGNOSIS — I10 Essential (primary) hypertension: Secondary | ICD-10-CM | POA: Diagnosis not present

## 2022-06-26 DIAGNOSIS — R2689 Other abnormalities of gait and mobility: Secondary | ICD-10-CM | POA: Diagnosis not present

## 2022-06-26 DIAGNOSIS — B372 Candidiasis of skin and nail: Secondary | ICD-10-CM | POA: Diagnosis not present

## 2022-06-26 DIAGNOSIS — R1312 Dysphagia, oropharyngeal phase: Secondary | ICD-10-CM | POA: Diagnosis not present

## 2022-06-26 DIAGNOSIS — M25551 Pain in right hip: Secondary | ICD-10-CM | POA: Diagnosis not present

## 2022-06-26 DIAGNOSIS — G8191 Hemiplegia, unspecified affecting right dominant side: Secondary | ICD-10-CM | POA: Diagnosis not present

## 2022-06-26 DIAGNOSIS — N390 Urinary tract infection, site not specified: Secondary | ICD-10-CM | POA: Diagnosis not present

## 2022-06-26 DIAGNOSIS — Z9181 History of falling: Secondary | ICD-10-CM | POA: Diagnosis not present

## 2022-06-26 DIAGNOSIS — S72001D Fracture of unspecified part of neck of right femur, subsequent encounter for closed fracture with routine healing: Secondary | ICD-10-CM | POA: Diagnosis not present

## 2022-06-26 DIAGNOSIS — Z471 Aftercare following joint replacement surgery: Secondary | ICD-10-CM | POA: Diagnosis not present

## 2022-06-26 DIAGNOSIS — M810 Age-related osteoporosis without current pathological fracture: Secondary | ICD-10-CM | POA: Diagnosis not present

## 2022-06-26 DIAGNOSIS — Z87440 Personal history of urinary (tract) infections: Secondary | ICD-10-CM | POA: Diagnosis not present

## 2022-06-26 DIAGNOSIS — B370 Candidal stomatitis: Secondary | ICD-10-CM | POA: Diagnosis not present

## 2022-06-26 DIAGNOSIS — Z7689 Persons encountering health services in other specified circumstances: Secondary | ICD-10-CM | POA: Diagnosis not present

## 2022-06-26 DIAGNOSIS — E039 Hypothyroidism, unspecified: Secondary | ICD-10-CM | POA: Diagnosis not present

## 2022-06-26 MED ORDER — SENNA 8.6 MG PO TABS: 2.0000 | ORAL_TABLET | Freq: Every day | ORAL | 0 refills | Status: AC | PRN

## 2022-06-26 MED ORDER — POLYETHYLENE GLYCOL 3350 17 G PO PACK: 17.0000 g | PACK | Freq: Every day | ORAL | 0 refills | Status: AC | PRN

## 2022-06-26 MED ORDER — FERROUS SULFATE 325 (65 FE) MG PO TABS: 325.0000 mg | ORAL_TABLET | Freq: Two times a day (BID) | ORAL | 3 refills | Status: DC

## 2022-06-26 MED ORDER — ENSURE ENLIVE PO LIQD: 237.0000 mL | Freq: Two times a day (BID) | ORAL | 12 refills | Status: AC

## 2022-06-26 MED ORDER — DOCUSATE SODIUM 100 MG PO CAPS: 100.0000 mg | ORAL_CAPSULE | Freq: Two times a day (BID) | ORAL | 0 refills | Status: AC

## 2022-06-26 MED ORDER — METHOCARBAMOL 500 MG PO TABS: 500.0000 mg | ORAL_TABLET | Freq: Four times a day (QID) | ORAL | 0 refills | Status: AC | PRN

## 2022-06-26 NOTE — TOC Transition Note (Signed)
Transition of Care [facility]) - CM/SW Discharge Note   Patient Details  Name: Angela Buchanan MRN: [id number] Date of Birth: 1951/09/07  Transition of Care [facility]) CM/SW Contact:  Lennart Pall, LCSW Phone Number: 06/26/2022, 11:45 AM   Clinical Narrative:    SNF bed ready at [facility] today.  Have received insurance authorization and MD has medically cleared for dc.  Pt and spouse aware and agreeable.  PTAR called at 11:40am.  RN to call report to [phone].  No further TOC needs.   Final next level of care: Skilled Nursing Facility Barriers to Discharge: Barriers Resolved   Patient Goals and CMS Choice Patient states their goals for this hospitalization and ongoing recovery are:: return home after rehab CMS Medicare.gov Compare Post Acute Care list provided to:: Patient Choice offered to / list presented to : Patient, Spouse  Discharge Placement   Existing PASRR number confirmed : 06/23/22          Patient chooses bed at: [facility] Patient to be transferred to facility by: [place] Name of family member notified: spouse Patient and family notified of of transfer: 06/26/22  Discharge Plan and Services In-house Referral: Clinical Social Work   Post Acute Care Choice: [place] (prefer [facility] if possible)          DME Arranged: N/A DME Agency: NA                  Social Determinants of Health (SDOH) Interventions     Readmission Risk Interventions    06/23/2022    2:19 PM  Readmission Risk Prevention Plan  Transportation Screening Complete  PCP or Specialist Appt within 5-7 Days Complete  Home Care Screening Complete  Medication Review (RN CM) Complete

## 2022-06-26 NOTE — Progress Notes (Signed)
Physical Therapy Treatment Patient Details Name: Angela Buchanan MRN: [id number] DOB: [birth year] Today's Date: 06/26/2022   History of Present Illness 71 y.o. female with medical history significant of typical chest pain right breast cancer, carotid bruit, diverticulosis/diverticulitis, hypertension, premature atrial contractions, PVCs, bigeminy, seasonal allergies with a recent admission the hospital 05/12/22 due to left pain large subcortical infarct who is coming to the emergency department due to having a fall at home. Dx R hip fx, s/p percutaneous pinning 06/22/22.    PT Comments    General Comments: AxO x 3 very pleasant lady Assisted OOB to amb required + 2 assist and increased time.  General bed mobility comments: great difficutly self performing due to R hip pain and R UE non functional.  Utilizes bed pad to complete scooting to EOB. General transfer comment: required + 2 side by side assist to rise before placing R UE onto walker platform attatchment. General Gait Details: Very limited amb distance of 12 feet using R platform walker and + 2 assist as well as recliner following.  Very unsteady.  HIGH FALL RISK. Pt will need ST Rehab at SNF to address mobility and functional decline prior to safely returning home.  Recommendations for follow up therapy are one component of a multi-disciplinary discharge planning process, led by the attending physician.  Recommendations may be updated based on patient status, additional functional criteria and insurance authorization.  Follow Up Recommendations  Skilled nursing-short term rehab (<3 hours/day) Can patient physically be transported by private vehicle: No   Assistance Recommended at Discharge Frequent or constant Supervision/Assistance  Patient can return home with the following A lot of help with walking and/or transfers;A lot of help with bathing/dressing/bathroom;Assistance with cooking/housework;Assist for transportation;Help with stairs or  ramp for entrance   Equipment Recommendations  None recommended by PT    Recommendations for Other Services       Precautions / Restrictions Precautions Precautions: Fall Precaution Comments: R sided weakness due to recent CVA Restrictions Weight Bearing Restrictions: No RLE Weight Bearing: Weight bearing as tolerated     Mobility  Bed Mobility Overal bed mobility: Needs Assistance Bed Mobility: Supine to Sit     Supine to sit: Mod assist, HOB elevated, Max assist, +2 for physical assistance, +2 for safety/equipment     General bed mobility comments: great difficutly self performing due to R hip pain and R UE non functional.  Utilizes bed pad to complete scooting to EOB.    Transfers Overall transfer level: Needs assistance Equipment used: None Transfers: Sit to/from Stand Sit to Stand: Min assist, Mod assist, +2 physical assistance, +2 safety/equipment Stand pivot transfers: +2 physical assistance, +2 safety/equipment, Mod assist         General transfer comment: required + 2 side by side assist to rise before placing R UE onto walker platform attatchment.    Ambulation/Gait Ambulation/Gait assistance: Max assist, +2 physical assistance, +2 safety/equipment Gait Distance (Feet): 12 Feet Assistive device: Rolling walker (2 wheels), Right platform walker Gait Pattern/deviations: Step-to pattern, Decreased stance time - right, Narrow base of support Gait velocity: decreased     General Gait Details: Very limited amb distance of 12 feet using R platform walker and + 2 assist as well as recliner following.  Very unsteady.  HIGH FALL RISK.   Stairs             Wheelchair Mobility    Modified Rankin (Stroke Patients Only)       Balance  Cognition Arousal/Alertness: Awake/alert   Overall Cognitive Status: Within Functional Limits for tasks assessed                                  General Comments: AxO x 3 very pleasant lady        Exercises      General Comments        Pertinent Vitals/Pain Pain Assessment Pain Assessment: Faces Faces Pain Scale: Hurts little more Pain Location: right hip Pain Descriptors / Indicators: Sore, Grimacing, Operative site guarding Pain Intervention(s): Monitored during session, Repositioned, Premedicated before session    Home Living                          Prior Function            PT Goals (current goals can now be found in the care plan section) Progress towards PT goals: Progressing toward goals    Frequency    7X/week      PT Plan Current plan remains appropriate    Co-evaluation              AM-PAC PT "6 Clicks" Mobility   Outcome Measure  Help needed turning from your back to your side while in a flat bed without using bedrails?: A Lot Help needed moving from lying on your back to sitting on the side of a flat bed without using bedrails?: A Lot Help needed moving to and from a bed to a chair (including a wheelchair)?: A Lot Help needed standing up from a chair using your arms (e.g., wheelchair or bedside chair)?: A Lot Help needed to walk in hospital room?: A Lot Help needed climbing 3-5 steps with a railing? : Total 6 Click Score: 11    End of Session Equipment Utilized During Treatment: Gait belt Activity Tolerance: Patient limited by pain Patient left: in chair;with chair alarm set;with call bell/phone within reach Nurse Communication: Mobility status PT Visit Diagnosis: Muscle weakness (generalized) (M62.81);Difficulty in walking, not elsewhere classified (R26.2);Pain;History of falling (Z91.81) Pain - Right/Left: Right Pain - part of body: Hip     Time: 1140-1150 PT Time Calculation (min) (ACUTE ONLY): 10 min  Charges:  $Gait Training: 8-22 mins                     Rica Koyanagi  PTA [place] Office M-F          [phone] Weekend  pager [phone]

## 2022-06-26 NOTE — Discharge Summary (Addendum)
Physician Discharge Summary   Patient: Angela Buchanan MRN: [id number] DOB: [birth year]  Admit date:     06/21/2022  Discharge date: 06/26/22  Discharge Physician: Hosie Poisson   PCP: Glenis Smoker, MD   Recommendations at discharge:  Please follow up with PCP in one week.  Please follow up with orthopedics as recommended.  Please follow up with palliative care at [facility].   Discharge Diagnoses: Principal Problem:   Closed fracture of femur, neck ([place]) Active Problems:   Essential hypertension   Pure hypercholesterolemia   CVA (cerebral vascular accident) ([place])   Urinary frequency   Hypokalemia   Hypothyroidism   Malnutrition of moderate degree    Hospital Course:   Angela Buchanan is a 71 y.o. female with medical history significant of typical chest pain right breast cancer, carotid bruit, diverticulosis/diverticulitis, hypertension, premature atrial contractions, PVCs, bigeminy, seasonal allergies who was recently discharged from the hospital due to left pain large subcortical infarct who is coming to the emergency department due to having a fall at home.    Right hip x-ray shows right femoral neck fracture.  CT of the right hip also showed small ill-defined hematoma seen in the subcutaneous soft tissues at the lateral aspect of the right hip and proximal thigh.  She underwent surgical repair, and is currently stable for discharge.    Assessment and Plan:  Right femoral neck fracture Patient underwent Right hip closed reduction percutaneous pinning by Dr Griffin Basil. Pain control and therapy evaluations recommending snf.  Currently waiting for SNF placement.  No new complaints.        Hypertension; BP parameters are well controlled.        Hypothyroidism:  Resume synthroid.      Hyperlipidemia:  Resume statin.        Hypokalemia:  Replaced.      Macrocytic anemia: / ACUTE ANEMIA OF BLOOD LOSS from the procedure.  Anemia panel shows iron deficiency.   Hemoglobin dropped from 12.2 on admission to 9.6  and stabilized at 10.1 Transfuse to keep hemoglobin greater than 7.  Continue to  monitor as CT hip show ill-defined hematoma seen in the subcutaneous soft tissues at the lateral aspect of the right hip and proximal thigh.      H/o recent CVA With right hemiparesis.  Right upper extremity weakness persistent.  Therapy eval recommending SNF.      Consultants: orthopedics.  Procedures performed: orthopedics.   Disposition: Skilled nursing facility Diet recommendation:  Discharge Diet Orders (From admission, onward)     Start     Ordered   06/26/22 0000  Diet - low sodium heart healthy        06/26/22 7867           Regular diet DISCHARGE MEDICATION: Allergies as of 06/26/2022       Reactions   Atenolol Other (See Comments)   CHEST PAIN   Bisoprolol Other (See Comments)   "FEELS WEIRD"   Prednisone Other (See Comments)   UNABLE TO FOCUS   Amoxicillin    unknown   Amoxicillin-pot Clavulanate Diarrhea, Other (See Comments)   Hctz [hydrochlorothiazide]    Hyponatremia   Rosuvastatin    myalgias   Latex Rash        Medication List     STOP taking these medications    B-complex with vitamin C tablet   cyanocobalamin 1000 MCG tablet Commonly known as: VITAMIN B12       TAKE these medications  amLODipine 5 MG tablet Commonly known as: NORVASC Take 5 mg by mouth daily.   Aspirin Low Dose 81 MG tablet Generic drug: aspirin EC Take 1 tablet (81 mg total) by mouth daily. Swallow whole.   calcium-vitamin D 500-5 MG-MCG tablet Commonly known as: OSCAL WITH D Take 1 tablet by mouth daily with breakfast.   CENTRUM SILVER 50+WOMEN PO Take 1 tablet by mouth daily.   cetirizine 10 MG tablet Commonly known as: ZYRTEC Take 10 mg by mouth daily.   clobetasol ointment 0.05 % Commonly known as: TEMOVATE Apply topically 2 (two) times daily as needed (itching). What changed: how much to take    clopidogrel 75 MG tablet Commonly known as: PLAVIX Take 1 tablet (75 mg total) by mouth daily.   docusate sodium 100 MG capsule Commonly known as: COLACE Take 1 capsule (100 mg total) by mouth 2 (two) times daily.   feeding supplement Liqd Take 237 mLs by mouth 2 (two) times daily between meals.   ferrous sulfate 325 (65 FE) MG tablet Take 1 tablet (325 mg total) by mouth 2 (two) times daily with a meal.   fluticasone 50 MCG/ACT nasal spray Commonly known as: FLONASE Place 1 spray into both nostrils daily as needed for allergies or rhinitis.   HYDROcodone-acetaminophen 5-325 MG tablet Commonly known as: Norco Take 1 tablet by mouth every 6 (six) hours as needed for severe pain.   hydrOXYzine 25 MG tablet Commonly known as: ATARAX Take 25 mg by mouth 3 (three) times daily as needed for nausea or vomiting.   levothyroxine 25 MCG tablet Commonly known as: SYNTHROID Take 1 tablet (25 mcg total) by mouth daily at 6 (six) AM.   lisinopril 40 MG tablet Commonly known as: ZESTRIL Take 40 mg by mouth daily.   meclizine 25 MG tablet Commonly known as: ANTIVERT Take 25 mg by mouth daily as needed for dizziness.   methocarbamol 500 MG tablet Commonly known as: ROBAXIN Take 1 tablet (500 mg total) by mouth every 6 (six) hours as needed for muscle spasms.   metoprolol tartrate 25 MG tablet Commonly known as: LOPRESSOR Take 25 mg by mouth every evening.   pantoprazole 40 MG tablet Commonly known as: PROTONIX Take 1 tablet (40 mg total) by mouth daily at 12 noon.   polyethylene glycol 17 g packet Commonly known as: MIRALAX / GLYCOLAX Take 17 g by mouth daily as needed for mild constipation.   pravastatin 40 MG tablet Commonly known as: PRAVACHOL Take 1 tablet (40 mg total) by mouth daily.   senna 8.6 MG Tabs tablet Commonly known as: SENOKOT Take 2 tablets (17.2 mg total) by mouth daily as needed for mild constipation.   Vitamin D 50 MCG (2000 UT) tablet Take 2,000  Units by mouth daily.        Contact information for follow-up providers     Angela Gash, MD Follow up in 2 week(s).   Specialty: Orthopedic Surgery Why: For wound re-check Contact information: [address] [address] [phone]              Contact information for after-discharge care     Destination     [facility] .   Service: Skilled Nursing Contact information: [address] [place] [phone]                    Discharge Exam: Danley Danker Weights   06/22/22 0050  Weight: 49.4  kg   General exam: Appears calm and comfortable  Respiratory system: Clear to auscultation. Respiratory effort normal. Cardiovascular system: S1 & S2 heard, RRR. No JVD, . No pedal edema. Gastrointestinal system: Abdomen is nondistended, soft and nontender. Normal bowel sounds heard. Central nervous system: Alert and oriented. No focal neurological deficits. Extremities: Symmetric 5 x 5 power. Skin: No rashes, lesions or ulcers Psychiatry: Judgement and insight appear normal. Mood & affect appropriate.    Condition at discharge: fair  The results of significant diagnostics from this hospitalization (including imaging, microbiology, ancillary and laboratory) are listed below for reference.   Imaging Studies: DG Hip Port Unilat With Pelvis 1V Right  Result Date: 06/22/2022 CLINICAL DATA:  Postoperative right hip EXAM: DG HIP (WITH OR WITHOUT PELVIS) 1V PORT RIGHT COMPARISON:  Right hip radiographs 06/21/2022; CT right hip 06/21/2022 FINDINGS: There are 3 screws now fixating the previously seen proximal right femoral neck fracture. Minimal lateral cortical step-off but otherwise normal alignment on the provided two views. No evidence of hardware failure. Mild superior left femoroacetabular joint space narrowing. Mild pubic symphysis joint space narrowing. IMPRESSION: Interval ORIF of the previously seen  right femoral neck fracture with near anatomic alignment. Electronically Signed   By: Yvonne Kendall M.D.   On: 06/22/2022 14:25   DG HIP UNILAT WITH PELVIS 2-3 VIEWS RIGHT  Result Date: 06/22/2022 CLINICAL DATA:  Right hip fracture EXAM: DG HIP (WITH OR WITHOUT PELVIS) 2-3V RIGHT COMPARISON:  06/21/2022 FINDINGS: Eleven C-arm images the right hip were obtained. These reveal placement of 3 threaded screws across the right femoral neck fracture. Hardware in good position. Fracture in satisfactory alignment. IMPRESSION: Screw fixation of right femoral neck fracture. Electronically Signed   By: Franchot Gallo M.D.   On: 06/22/2022 13:34   DG C-Arm 1-60 Min-No Report  Result Date: 06/22/2022 Fluoroscopy was utilized by the requesting physician.  No radiographic interpretation.   CT Hip Right Wo Contrast  Result Date: 06/21/2022 CLINICAL DATA:  Hip trauma, fracture suspected, xray done EXAM: CT OF THE RIGHT HIP WITHOUT CONTRAST TECHNIQUE: Multidetector CT imaging of the right hip was performed according to the standard protocol. Multiplanar CT image reconstructions were also generated. RADIATION DOSE REDUCTION: This exam was performed according to the departmental dose-optimization program which includes automated exposure control, adjustment of the mA and/or kV according to patient size and/or use of iterative reconstruction technique. COMPARISON:  X-ray 06/21/2022, MRI 06/03/2020 FINDINGS: Bones/Joint/Cartilage Acute transcervical right femoral neck fracture with minimal impaction (series 6, image 27). No fracture extension to the femoral head or into the intertrochanteric region. Normal hip joint alignment without dislocation. No appreciable joint effusion. Imaged right hemipelvis intact without fracture or diastasis. Previously described lesion of the right greater trochanter is not less conspicuous on the current exam (series 2, image 63), likely benign. No suspicious lytic or sclerotic bone lesion is  seen. Ligaments Suboptimally assessed by CT. Muscles and Tendons No acute musculotendinous abnormality by CT. Soft tissues Soft tissue swelling with small ill-defined hematomas in the subcutaneous soft tissues at the lateral aspect of the right hip and proximal thigh. No right inguinal lymphadenopathy. Scattered vascular calcification. IMPRESSION: 1. Acute transcervical right femoral neck fracture with minimal impaction. 2. Soft tissue swelling with small ill-defined hematomas in the subcutaneous soft tissues at the lateral aspect of the right hip and proximal thigh. Electronically Signed   By: Davina Poke D.O.   On: 06/21/2022 14:02   DG Chest 1 View  Result Date: 06/21/2022 CLINICAL DATA:  Acute right hip fracture.  Pain. EXAM: CHEST  1 VIEW COMPARISON:  AP chest 05/19/2020 FINDINGS: A likely cardiac loop recorder overlies the left hemithorax, new from prior. Cardiac silhouette and mediastinal contours within normal limits. Mild calcification within aortic arch. There is mild-to-moderate right and mild left apical pleural thickening. Mild bilateral upper lung interstitial thickening/scarring. No new airspace opacity. No pleural effusion pneumothorax. No acute skeletal abnormality. Surgical clips again overlie the bilateral breasts. IMPRESSION: No acute cardiopulmonary disease process. Electronically Signed   By: Yvonne Kendall M.D.   On: 06/21/2022 13:11   DG Shoulder Right  Result Date: 06/21/2022 CLINICAL DATA:  Right shoulder pain after fall. EXAM: RIGHT SHOULDER - 2+ VIEW COMPARISON:  None Available. FINDINGS: The glenohumeral and acromioclavicular joint spaces are normally aligned. There is diffuse decreased bone mineralization. No acute fracture line is seen. No dislocation. The visualized portion of the right lung is unremarkable. IMPRESSION: No acute fracture is seen. Electronically Signed   By: Yvonne Kendall M.D.   On: 06/21/2022 13:07   DG Hip Unilat W or Wo Pelvis 2-3 Views Right  Result  Date: 06/21/2022 CLINICAL DATA:  Hip pain after fall. EXAM: DG HIP (WITH OR WITHOUT PELVIS) 2-3V RIGHT COMPARISON:  Pelvis and right hip radiograph 05/19/2020; MRI right hip 06/03/2020 FINDINGS: There is a new linear lucency within the medial aspect of the right femoral neck. Minimal bilateral femoroacetabular joint space narrowing. Mild bilateral sacroiliac subchondral sclerosis. The previously described centrally lucent and peripherally sclerotic focus within the lateral aspect of the right greater trochanter is less well visualized compared to 05/19/2020 radiograph. No definite suspicious bone lesion is seen. IMPRESSION: Nondisplaced acute medial right femoral neck fracture. Electronically Signed   By: Yvonne Kendall M.D.   On: 06/21/2022 13:06   CUP PACEART REMOTE DEVICE CHECK  Result Date: 06/20/2022 ILR summary report received. Battery status OK. Normal device function. No new symptom, tachy, brady, or pause episodes. No new AF episodes. Monthly summary reports and ROV/PRN   Microbiology: Results for orders placed or performed during the hospital encounter of 06/21/22  Surgical pcr screen     Status: None   Collection Time: 06/21/22  4:42 PM   Specimen: Nasal Mucosa; Nasal Swab  Result Value Ref Range Status   MRSA, PCR NEGATIVE NEGATIVE Final   Staphylococcus aureus NEGATIVE NEGATIVE Final    Comment: (NOTE) The Xpert SA Assay (FDA approved for NASAL specimens in patients 14 years of age and older), is one component of a comprehensive surveillance program. It is not intended to diagnose infection nor to guide or monitor treatment. Performed at [facility], [place] [address], [place], [place] [address]     Labs: CBC: Recent Labs  Lab 06/21/22 1231 06/22/22 0412 06/23/22 0409 06/24/22 0305  WBC 5.5 8.6 8.8 6.0  NEUTROABS 4.3  --  7.4 3.7  HGB 12.2 11.0* 9.6* 10.1*  HCT 35.8* 32.1* 29.0* 30.0*  MCV 101.4* 101.3* 103.9* 102.4*  PLT 435* 406* 369 160   Basic  Metabolic Panel: Recent Labs  Lab 06/21/22 1231 06/21/22 1815 06/22/22 0412 06/23/22 0409 06/24/22 0305  NA 141  --  138 134* 139  K 2.4* 2.9* 3.4* 3.2* 3.9  CL 110  --  112* 108 112*  CO2 22  --  21* 19* 21*  GLUCOSE 106*  --  153* 161* 115*  BUN 12  --  '13 12 13  '$ CREATININE 0.76  --  0.71 0.71 0.59  CALCIUM 9.2  --  9.1 8.2* 8.5*  MG 2.0  --   --  1.9  --    Liver Function Tests: Recent Labs  Lab 06/22/22 0412  AST 22  ALT 20  ALKPHOS 61  BILITOT 0.5  PROT 6.0*  ALBUMIN 3.5   CBG: No results for input(s): "GLUCAP" in the last 168 hours.  Discharge time spent: 42 minutes.   Signed: Hosie Poisson, MD [facility] 06/26/2022

## 2022-06-26 NOTE — Progress Notes (Signed)
Chaplain engaged in an initial visit with [name].  Chaplain provided education on the Advanced Directive, Healthcare POA, document.  [name] expressed that she would like to assign her husband as her healthcare agent.  Chaplain did let her know that because she is legally married, her husband is able to make healthcare decisions on her behalf if she loses the capacity to do so for herself. [name] plans on completing the paperwork outside of the hospital at her living facility, [facility].    [name] also stated to Chaplain she wants to be DNR.  Chaplain let the nurse and Social Worker know this information.  Chaplain and Education officer, museum believe it would be best to input that change in status before she leaves for [employer].    [name] likes the facility that she is going to and hopes that everything works out with her insurance.  Chaplain was able to provide some water to [name] and support.    06/26/22 1100  Clinical Encounter Type  Visited With Patient  Visit Type Initial;Social support  Referral From Patient  Consult/Referral To Chaplain  Spiritual Encounters  Spiritual Needs Brochure;Literature

## 2022-06-27 ENCOUNTER — Encounter (HOSPITAL_COMMUNITY): Payer: Self-pay | Admitting: Orthopaedic Surgery

## 2022-06-28 DIAGNOSIS — I10 Essential (primary) hypertension: Secondary | ICD-10-CM | POA: Diagnosis not present

## 2022-06-28 DIAGNOSIS — C50411 Malignant neoplasm of upper-outer quadrant of right female breast: Secondary | ICD-10-CM | POA: Diagnosis not present

## 2022-06-28 DIAGNOSIS — S50311A Abrasion of right elbow, initial encounter: Secondary | ICD-10-CM | POA: Diagnosis not present

## 2022-06-28 DIAGNOSIS — S60222A Contusion of left hand, initial encounter: Secondary | ICD-10-CM | POA: Diagnosis not present

## 2022-06-28 DIAGNOSIS — M25551 Pain in right hip: Secondary | ICD-10-CM | POA: Diagnosis not present

## 2022-06-28 DIAGNOSIS — I639 Cerebral infarction, unspecified: Secondary | ICD-10-CM | POA: Diagnosis not present

## 2022-06-29 DIAGNOSIS — S72001D Fracture of unspecified part of neck of right femur, subsequent encounter for closed fracture with routine healing: Secondary | ICD-10-CM | POA: Diagnosis not present

## 2022-06-29 DIAGNOSIS — E785 Hyperlipidemia, unspecified: Secondary | ICD-10-CM | POA: Diagnosis not present

## 2022-06-29 DIAGNOSIS — G8191 Hemiplegia, unspecified affecting right dominant side: Secondary | ICD-10-CM | POA: Diagnosis not present

## 2022-06-29 DIAGNOSIS — I10 Essential (primary) hypertension: Secondary | ICD-10-CM | POA: Diagnosis not present

## 2022-07-04 ENCOUNTER — Other Ambulatory Visit: Payer: Self-pay | Admitting: Adult Health

## 2022-07-04 DIAGNOSIS — Z853 Personal history of malignant neoplasm of breast: Secondary | ICD-10-CM

## 2022-07-04 DIAGNOSIS — N182 Chronic kidney disease, stage 2 (mild): Secondary | ICD-10-CM | POA: Diagnosis not present

## 2022-07-04 DIAGNOSIS — K5900 Constipation, unspecified: Secondary | ICD-10-CM | POA: Diagnosis not present

## 2022-07-04 DIAGNOSIS — S72001D Fracture of unspecified part of neck of right femur, subsequent encounter for closed fracture with routine healing: Secondary | ICD-10-CM | POA: Diagnosis not present

## 2022-07-04 DIAGNOSIS — Z1231 Encounter for screening mammogram for malignant neoplasm of breast: Secondary | ICD-10-CM

## 2022-07-04 DIAGNOSIS — D75839 Thrombocytosis, unspecified: Secondary | ICD-10-CM | POA: Diagnosis not present

## 2022-07-04 DIAGNOSIS — D649 Anemia, unspecified: Secondary | ICD-10-CM | POA: Diagnosis not present

## 2022-07-04 NOTE — Progress Notes (Signed)
[place] loop recorder 

## 2022-07-06 DIAGNOSIS — B370 Candidal stomatitis: Secondary | ICD-10-CM | POA: Diagnosis not present

## 2022-07-06 DIAGNOSIS — J019 Acute sinusitis, unspecified: Secondary | ICD-10-CM | POA: Diagnosis not present

## 2022-07-07 DIAGNOSIS — I10 Essential (primary) hypertension: Secondary | ICD-10-CM | POA: Diagnosis not present

## 2022-07-07 DIAGNOSIS — I639 Cerebral infarction, unspecified: Secondary | ICD-10-CM | POA: Diagnosis not present

## 2022-07-07 DIAGNOSIS — Z7689 Persons encountering health services in other specified circumstances: Secondary | ICD-10-CM | POA: Diagnosis not present

## 2022-07-07 DIAGNOSIS — S50311A Abrasion of right elbow, initial encounter: Secondary | ICD-10-CM | POA: Diagnosis not present

## 2022-07-07 DIAGNOSIS — C50411 Malignant neoplasm of upper-outer quadrant of right female breast: Secondary | ICD-10-CM | POA: Diagnosis not present

## 2022-07-10 DIAGNOSIS — B370 Candidal stomatitis: Secondary | ICD-10-CM | POA: Diagnosis not present

## 2022-07-10 DIAGNOSIS — Z87440 Personal history of urinary (tract) infections: Secondary | ICD-10-CM | POA: Diagnosis not present

## 2022-07-10 DIAGNOSIS — B372 Candidiasis of skin and nail: Secondary | ICD-10-CM | POA: Diagnosis not present

## 2022-07-13 DIAGNOSIS — Z87440 Personal history of urinary (tract) infections: Secondary | ICD-10-CM | POA: Diagnosis not present

## 2022-07-13 DIAGNOSIS — Z7689 Persons encountering health services in other specified circumstances: Secondary | ICD-10-CM | POA: Diagnosis not present

## 2022-07-18 DIAGNOSIS — S50311A Abrasion of right elbow, initial encounter: Secondary | ICD-10-CM | POA: Diagnosis not present

## 2022-07-18 DIAGNOSIS — I639 Cerebral infarction, unspecified: Secondary | ICD-10-CM | POA: Diagnosis not present

## 2022-07-18 DIAGNOSIS — I10 Essential (primary) hypertension: Secondary | ICD-10-CM | POA: Diagnosis not present

## 2022-07-18 DIAGNOSIS — C50411 Malignant neoplasm of upper-outer quadrant of right female breast: Secondary | ICD-10-CM | POA: Diagnosis not present

## 2022-07-21 DIAGNOSIS — Z9181 History of falling: Secondary | ICD-10-CM | POA: Diagnosis not present

## 2022-07-21 DIAGNOSIS — R2689 Other abnormalities of gait and mobility: Secondary | ICD-10-CM | POA: Diagnosis not present

## 2022-07-21 DIAGNOSIS — M6281 Muscle weakness (generalized): Secondary | ICD-10-CM | POA: Diagnosis not present

## 2022-07-24 ENCOUNTER — Ambulatory Visit (INDEPENDENT_AMBULATORY_CARE_PROVIDER_SITE_OTHER): Payer: Medicare PPO

## 2022-07-24 DIAGNOSIS — I639 Cerebral infarction, unspecified: Secondary | ICD-10-CM

## 2022-07-25 LAB — CUP PACEART REMOTE DEVICE CHECK
Date Time Interrogation Session: 20231023063908
Implantable Pulse Generator Implant Date: 20230815
Pulse Gen Serial Number: 183424

## 2022-07-30 ENCOUNTER — Encounter (HOSPITAL_BASED_OUTPATIENT_CLINIC_OR_DEPARTMENT_OTHER): Payer: Self-pay

## 2022-07-30 ENCOUNTER — Emergency Department (HOSPITAL_BASED_OUTPATIENT_CLINIC_OR_DEPARTMENT_OTHER)
Admission: EM | Admit: 2022-07-30 | Discharge: 2022-07-31 | Disposition: A | Discharge status: Home / Self Care | Payer: Medicare PPO | Attending: Emergency Medicine | Admitting: Emergency Medicine

## 2022-07-30 DIAGNOSIS — Z7982 Long term (current) use of aspirin: Secondary | ICD-10-CM | POA: Diagnosis not present

## 2022-07-30 DIAGNOSIS — I10 Essential (primary) hypertension: Secondary | ICD-10-CM | POA: Diagnosis not present

## 2022-07-30 DIAGNOSIS — Z91040 Latex allergy status: Secondary | ICD-10-CM | POA: Diagnosis not present

## 2022-07-30 DIAGNOSIS — M25551 Pain in right hip: Secondary | ICD-10-CM | POA: Diagnosis not present

## 2022-07-30 DIAGNOSIS — Z79899 Other long term (current) drug therapy: Secondary | ICD-10-CM | POA: Diagnosis not present

## 2022-07-30 DIAGNOSIS — Z853 Personal history of malignant neoplasm of breast: Secondary | ICD-10-CM | POA: Diagnosis not present

## 2022-07-30 MED ORDER — OXYCODONE-ACETAMINOPHEN 5-325 MG PO TABS
2.0000 | ORAL_TABLET | Freq: Once | ORAL | Status: AC
  Administered 2022-07-31: 2 via ORAL
  Filled 2022-07-30: qty 2

## 2022-07-30 MED ORDER — OXYCODONE-ACETAMINOPHEN 5-325 MG PO TABS
1.0000 | ORAL_TABLET | ORAL | Status: DC | PRN
  Administered 2022-07-30: 1 via ORAL
  Filled 2022-07-30: qty 1

## 2022-07-30 NOTE — ED Triage Notes (Signed)
Pt states she had a stroke in august, states after she got out of rehab she had a mechanical fall and fractured her right hip. Pt completed rehab as well for her hip. She presents today with worsening pain her hip and is having some body aches, mainly in her shoulders. Pt denies any new injuries. She also states she has been treated for thrush, reports ongoing pain in her throat. Pt AxOx4.

## 2022-07-30 NOTE — ED Provider Notes (Signed)
[facility] EMERGENCY DEPT Provider Note   CSN: [id number] Arrival date & time: 07/30/22  1547     History  Chief Complaint  Patient presents with   Hip Pain    Angela Buchanan is a 71 y.o. female.  Patient is a 71 year old female with past medical history of hypertension, breast cancer, hyperlipidemia.  She had recent hospital admission for stroke which caused a right-sided hemiparesis.  Patient subsequently fell during the rehab process and fractured her right hip.  She was discharged from rehab 1 week ago after surgical repair performed by Dr. Griffin Basil.  Since arriving home, patient has been having ongoing pain in her right hip.  This is worse when she attempts to ambulate or walk.  She denies any new injury or trauma.  She denies any fevers, chills, or redness at the incision site.  She has been taking ibuprofen at home for pain, but this does not seem to help.  She also reports pain in her mouth and throat.  She was given nystatin while in rehab which helped, however this was not prescribed for her when she was discharged.  The history is provided by the patient.       Home Medications Prior to Admission medications   Medication Sig Start Date End Date Taking? Authorizing Provider  amLODipine (NORVASC) 5 MG tablet Take 5 mg by mouth daily.    [provider]  aspirin EC 81 MG tablet Take 1 tablet (81 mg total) by mouth daily. Swallow whole. 05/18/22   August Albino, NP  calcium-vitamin D Darron Doom WITH D) 500-5 MG-MCG tablet Take 1 tablet by mouth daily with breakfast. 05/18/22   August Albino, NP  cetirizine (ZYRTEC) 10 MG tablet Take 10 mg by mouth daily.    [provider]  Cholecalciferol (VITAMIN D) 50 MCG (2000 UT) tablet Take 2,000 Units by mouth daily.    [provider]  clobetasol ointment (TEMOVATE) 0.05 % Apply topically 2 (two) times daily as needed (itching). Patient taking differently: Apply 1 Application topically 2 (two)  times daily as needed (itching). 05/17/22   August Albino, NP  clopidogrel (PLAVIX) 75 MG tablet Take 1 tablet (75 mg total) by mouth daily. 05/18/22   August Albino, NP  docusate sodium (COLACE) 100 MG capsule Take 1 capsule (100 mg total) by mouth 2 (two) times daily. 06/26/22   Hosie Poisson, MD  feeding supplement (ENSURE ENLIVE / ENSURE PLUS) LIQD Take 237 mLs by mouth 2 (two) times daily between meals. 06/26/22   Hosie Poisson, MD  ferrous sulfate 325 (65 FE) MG tablet Take 1 tablet (325 mg total) by mouth 2 (two) times daily with a meal. 06/26/22   Hosie Poisson, MD  fluticasone (FLONASE) 50 MCG/ACT nasal spray Place 1 spray into both nostrils daily as needed for allergies or rhinitis.    [provider]  HYDROcodone-acetaminophen (NORCO) 5-325 MG tablet Take 1 tablet by mouth every 6 (six) hours as needed for severe pain. 06/23/22   McBane, Maylene Roes, PA-C  hydrOXYzine (ATARAX) 25 MG tablet Take 25 mg by mouth 3 (three) times daily as needed for nausea or vomiting.    [provider]  levothyroxine (SYNTHROID) 25 MCG tablet Take 1 tablet (25 mcg total) by mouth daily at 6 (six) AM. 05/18/22   August Albino, NP  lisinopril (ZESTRIL) 40 MG tablet Take 40 mg by mouth daily.    [provider]  meclizine (ANTIVERT) 25 MG tablet Take  25 mg by mouth daily as needed for dizziness.    [provider]  methocarbamol (ROBAXIN) 500 MG tablet Take 1 tablet (500 mg total) by mouth every 6 (six) hours as needed for muscle spasms. 06/26/22   Hosie Poisson, MD  metoprolol tartrate (LOPRESSOR) 25 MG tablet Take 25 mg by mouth every evening.    [provider]  Multiple Vitamins-Minerals (CENTRUM SILVER 50+WOMEN PO) Take 1 tablet by mouth daily.    [provider]  pantoprazole (PROTONIX) 40 MG tablet Take 1 tablet (40 mg total) by mouth daily at 12 noon. 05/17/22   August Albino, NP  polyethylene glycol (MIRALAX / GLYCOLAX) 17 g packet Take 17 g by mouth  daily as needed for mild constipation. 06/26/22   Hosie Poisson, MD  pravastatin (PRAVACHOL) 40 MG tablet Take 1 tablet (40 mg total) by mouth daily. 05/18/22   August Albino, NP  senna (SENOKOT) 8.6 MG TABS tablet Take 2 tablets (17.2 mg total) by mouth daily as needed for mild constipation. 06/26/22   Hosie Poisson, MD      Allergies    Atenolol, Bisoprolol, Prednisone, Amoxicillin, Amoxicillin-pot clavulanate, Hctz [hydrochlorothiazide], Rosuvastatin, and Latex    Review of Systems   Review of Systems  All other systems reviewed and are negative.   Physical Exam Updated Vital Signs BP 129/74   Pulse 73   Temp 97.6 F (36.4 C) (Oral)   Resp 16   Ht '5\' 2"'$  (1.575 m)   Wt 45.4 kg   LMP  (LMP Unknown)   SpO2 97%   BMI 18.29 kg/m  Physical Exam Vitals and nursing note reviewed.  Constitutional:      General: She is not in acute distress.    Appearance: She is well-developed. She is not diaphoretic.  HENT:     Head: Normocephalic and atraumatic.  Cardiovascular:     Rate and Rhythm: Normal rate and regular rhythm.     Heart sounds: No murmur heard.    No friction rub. No gallop.  Pulmonary:     Effort: Pulmonary effort is normal. No respiratory distress.     Breath sounds: Normal breath sounds. No wheezing.  Abdominal:     General: Bowel sounds are normal. There is no distension.     Palpations: Abdomen is soft.     Tenderness: There is no abdominal tenderness.  Musculoskeletal:        General: Normal range of motion.     Cervical back: Normal range of motion and neck supple.     Comments: The right hip has good range of motion.  Surgical incision appears to be well-healed with no redness or warmth.  DP pulses are palpable and motor and sensation are intact throughout the entire foot.  Skin:    General: Skin is warm and dry.  Neurological:     Mental Status: She is alert and oriented to person, place, and time.     Cranial Nerves: No cranial nerve deficit.      Comments: There is some weakness noted to the right upper extremity.     ED Results / Procedures / Treatments   Labs (all labs ordered are listed, but only abnormal results are displayed) Labs Reviewed - No data to display  EKG None  Radiology No results found.  Procedures Procedures  {Document cardiac monitor, telemetry assessment procedure when appropriate:1}  Medications Ordered in ED Medications  oxyCODONE-acetaminophen (PERCOCET/ROXICET) 5-325 MG per tablet 1 tablet (1 tablet Oral Given 07/30/22  1948)  oxyCODONE-acetaminophen (PERCOCET/ROXICET) 5-325 MG per tablet 2 tablet (has no administration in time range)    ED Course/ Medical Decision Making/ A&P                           Medical Decision Making Amount and/or Complexity of Data Reviewed Radiology: ordered.  Risk Prescription drug management.   ***  {Document critical care time when appropriate:1} {Document review of labs and clinical decision tools ie heart score, Chads2Vasc2 etc:1}  {Document your independent review of radiology images, and any outside records:1} {Document your discussion with family members, caretakers, and with consultants:1} {Document social determinants of health affecting pt's care:1} {Document your decision making why or why not admission, treatments were needed:1} Final Clinical Impression(s) / ED Diagnoses Final diagnoses:  None    Rx / DC Orders ED Discharge Orders     None

## 2022-07-31 ENCOUNTER — Emergency Department (HOSPITAL_BASED_OUTPATIENT_CLINIC_OR_DEPARTMENT_OTHER): Payer: Medicare PPO | Admitting: Radiology

## 2022-07-31 DIAGNOSIS — Z23 Encounter for immunization: Secondary | ICD-10-CM | POA: Diagnosis not present

## 2022-07-31 DIAGNOSIS — Z79899 Other long term (current) drug therapy: Secondary | ICD-10-CM | POA: Diagnosis not present

## 2022-07-31 DIAGNOSIS — M25551 Pain in right hip: Secondary | ICD-10-CM | POA: Diagnosis not present

## 2022-07-31 DIAGNOSIS — R296 Repeated falls: Secondary | ICD-10-CM | POA: Diagnosis not present

## 2022-07-31 DIAGNOSIS — S72001A Fracture of unspecified part of neck of right femur, initial encounter for closed fracture: Secondary | ICD-10-CM | POA: Diagnosis not present

## 2022-07-31 DIAGNOSIS — S72001S Fracture of unspecified part of neck of right femur, sequela: Secondary | ICD-10-CM | POA: Diagnosis not present

## 2022-07-31 DIAGNOSIS — S728X1S Other fracture of right femur, sequela: Secondary | ICD-10-CM | POA: Diagnosis not present

## 2022-07-31 MED ORDER — OXYCODONE-ACETAMINOPHEN 5-325 MG PO TABS: 1.0000 | ORAL_TABLET | Freq: Four times a day (QID) | ORAL | 0 refills | Status: AC | PRN

## 2022-07-31 MED ORDER — NYSTATIN 100000 UNIT/ML MT SUSP: 500000.0000 [IU] | Freq: Four times a day (QID) | OROMUCOSAL | 1 refills | Status: AC

## 2022-07-31 NOTE — Discharge Instructions (Addendum)
Begin taking Percocet as prescribed as needed for pain.  Use nystatin as prescribed.  Follow-up with your orthopedist in the next week if pain is not improving.

## 2022-08-08 DIAGNOSIS — M25512 Pain in left shoulder: Secondary | ICD-10-CM | POA: Diagnosis not present

## 2022-08-08 DIAGNOSIS — S72001D Fracture of unspecified part of neck of right femur, subsequent encounter for closed fracture with routine healing: Secondary | ICD-10-CM | POA: Diagnosis not present

## 2022-08-08 NOTE — Therapy (Signed)
OUTPATIENT PHYSICAL THERAPY LOWER EXTREMITY EVALUATION   Patient Name: Angela Buchanan MRN: [id number] DOB:1951/03/30, 71 y.o., female Today's Date: 08/09/2022   PT End of Session - 08/09/22 1551     Visit Number 1    Date for PT Re-Evaluation 10/04/22    Authorization Type [employer]- visit requested    Progress Note Due on Visit 10    PT Start Time 1400    PT Stop Time 1442    PT Time Calculation (min) 42 min    Activity Tolerance Patient tolerated treatment well    Behavior During Therapy [facility] for tasks assessed/performed             Past Medical History:  Diagnosis Date   Atypical chest pain 07/19/2021   Bigeminy    no current med.   Breast cancer ([place]) 06/2017   right   Bruises easily    Carotid bruit 11/13/2019   Dental crowns present    History of diverticulitis    Hypertension    states under control with med., has been on med. x 5 yr.   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   Past Surgical History:  Procedure Laterality Date   ABDOMINAL HYSTERECTOMY     partial   BREAST LUMPECTOMY Right 06/22/2017   Malignant   BREAST LUMPECTOMY WITH RADIOACTIVE SEED AND SENTINEL LYMPH NODE BIOPSY Right 06/22/2017   Procedure: RIGHT BREAST LUMPECTOMY WITH RADIOACTIVE SEED AND RIGHT AXILLARY DEEP SENTINEL LYMPH NODE BIOPSY WITH BLUE DYE INJECTION;  Surgeon: Angela Skates, MD;  Location: [place];  Service: General;  Laterality: Right;   BREAST LUMPECTOMY WITH RADIOACTIVE SEED LOCALIZATION Left 06/22/2017   Procedure: LEFT BREAST LUMPECTOMY WITH RADIOACTIVE SEED LOCALIZATION;  Surgeon: Angela Skates, MD;  Location: [place];  Service: General;  Laterality: Left;   CATARACT EXTRACTION W/ INTRAOCULAR  LENS  IMPLANT, BILATERAL Bilateral    EXCISION OF BREAST BIOPSY Left 06/22/2017   benign   LOOP RECORDER INSERTION N/A 05/16/2022   Procedure: LOOP RECORDER INSERTION;  Surgeon: Angela Epley, MD;  Location: [facility];  Service: Cardiovascular;  Laterality: N/A;   PERCUTANEOUS PINNING Right 06/22/2022   Procedure: PERCUTANEOUS PINNING OF RIGHT HIP;  Surgeon: Angela Gash, MD;  Location: [facility];  Service: Orthopedics;  Laterality: Right;   TONSILLECTOMY     age 49   Patient Active Problem List   Diagnosis Date Noted   Malnutrition of moderate degree 06/22/2022   Closed fracture of femur, neck ([place]) 06/21/2022   Urinary frequency 06/21/2022   Hypokalemia 06/21/2022   Heart murmur 06/21/2022   Hypothyroidism 06/21/2022   Dysphagia 05/17/2022   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   CVA (cerebral vascular accident) ([place]) 05/12/2022   Osteoporosis 01/24/2022   Atypical chest pain 07/19/2021   Temporomandibular joint (TMJ) pain 07/16/2020   Referred otalgia of both ears 07/16/2020   PAC (premature atrial contraction) 05/11/2020   PVC (premature ventricular contraction) 05/11/2020   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   Carotid bruit 11/13/2019   Pure hypercholesterolemia 11/13/2019   Malignant neoplasm of upper-outer quadrant of right breast in female, estrogen receptor positive ([place]) 05/22/2017   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   Diplopia 09/17/2014   Dizziness and giddiness 09/17/2014   Essential  hypertension 07/22/2014   Chest pain 07/22/2014   Family history of heart disease 07/22/2014    PCP: Angela Hilding, MD  REFERRING PROVIDER: Sela Hilding, MD  REFERRING DIAG: R29.6 (ICD-10-CM) - Repeated falls, closed fracture of Rt hip  THERAPY DIAG:  Other abnormalities of gait and mobility - Plan: PT plan of care cert/re-cert  Muscle weakness (generalized) - Plan: PT plan of care cert/re-cert  Rationale for Evaluation and Treatment:  Rehabilitation  ONSET DATE: Injury 06/21/22, ORIF 06/22/22  SUBJECTIVE:   SUBJECTIVE STATEMENT: Patient is a 71 year old female who presents to PT s/p Rt hip ORIF secondary to fall and fracture 06/21/22.  She stroke in May 12, 2022 which caused a right-sided hemiparesis.  Patient subsequently fell during the rehab process and fractured her right hip.  Pt had a stay at a SNF 06/26/22-07/23/22 after surgical repair performed by Dr. Griffin Buchanan.  Since arriving home, patient has been having ongoing pain in her right hip and is working on mobility and gait.    PERTINENT HISTORY:  hypertension, breast cancer, hyperlipidemia, Lt hip fracture secondary to fall 06/2022, CVA 06/2022 PAIN:  Are you having pain? Yes: NPRS scale: 4/10 Pain location: Rt hip  Pain description: aching  Aggravating factors: walking, standing (limited to 10 minutes) Relieving factors: sitting down, Tylenol/Advil  PRECAUTIONS: Fall, Rt breast cancer with node removal, osteoporosis  WEIGHT BEARING RESTRICTIONS: No  FALLS:  Has patient fallen in last 6 months? Yes. Number of falls 1 after CVA  LIVING ENVIRONMENT: Lives with: lives with their spouse Lives in: House/apartment Stairs: Yes: External: 3 steps; on left going up Has following equipment at home: Hemi walker, platform walker  OCCUPATION: retired   PLOF: Independent with household mobility with device, Requires assistive device for independence, requires independence with ADLs and self-care, and Leisure: walking with her dog  PATIENT GOALS: reduce Rt hip pain, improve stability, strength and balance, return to independence   NEXT MD VISIT:  August 31, 2022 Angela Buchanan)  OBJECTIVE:   DIAGNOSTIC FINDINGS: Rt hip fracture and ORIF   COGNITION: Overall cognitive status: Within functional limits for tasks assessed     SENSATION: WFL   POSTURE: rounded shoulders, forward head, flexed trunk , and weight shift left  PALPATION: NA  LOWER EXTREMITY  ROM: Rt hip limited by 50%, hamstrings limited by 50% bil.   LOWER EXTREMITY MMT:  MMT Right eval Left eval  Hip flexion 3+ 4-  Hip extension    Hip abduction 3- 4  Hip adduction    Hip internal rotation    Hip external rotation    Knee flexion 3- 3+  Knee extension 3+ 4-  Ankle dorsiflexion 3+ 4-  Ankle plantarflexion    Ankle inversion    Ankle eversion     (Blank rows = not tested)  FUNCTIONAL TESTS:  5 times sit to stand: not able to due per pt request secondary to Lt shoulder pain with sit to stand.  Max LT UE pain with sit to stand  Timed up and go (TUG): 1 min, 27 seconds   GAIT: Distance walked: 25 feet  Assistive device utilized: Hemi walker on Lt Level of assistance: SBA Comments: slow mobility, reduced trunk rotation, reduce Rt LE stance time and reduced Rt arm swing   TODAY'S TREATMENT:                                DATE: 08/09/22 HEP established-husband present for  instruction  PATIENT EDUCATION:  Education details: Access Code: [email or web address] Person educated: Patient Education method: Explanation, Demonstration, and Handouts Education comprehension: verbalized understanding and returned demonstration  HOME EXERCISE PROGRAM: Access Code: [email or web address] URL: [email or web address] Date: 08/09/2022 Prepared by: Claiborne Billings  Exercises - Seated Long Arc Quad  - 3 x daily - 7 x weekly - 2 sets - 10 reps - 5 hold - Seated March   - 3 x daily - 7 x weekly - 3 sets - 10 reps - Seated Heel Toe Raises   - 3 x daily - 7 x weekly - 2 sets - 10 reps - Seated Isometric Hip Adduction with Ball  - 3 x daily - 7 x weekly - 2 sets - 10 reps  ASSESSMENT:  CLINICAL IMPRESSION: Patient is a 71 y.o. female who was seen today for physical therapy evaluation and treatment for balance, gait and Rt hip pain s/p Rt hip fracture with ORIF.  Pt had CVA in August with Rt sided weakness.  She sustained a fall 06/21/22 resulting in hip fracture and ORIF by Dr Angela Buchanan.  Pt stayed in a  SNF until 07/23/22.  She presents with LE weakness, limited walking endurance, device dependence and falls risk. Pt is limited to standing and walking 10 min max per her report.  Pt reports onset of Lt shoulder pain since the time of injury and this is impacting her mobility with sit to stand and walker use.  Pt demonstrates Rt LE strength deficits, limited functional A/ROM and slow mobility.  Pt with limited functional use of the Rt UE secondary to CVA.  She would also like OT for her Rt UE deficits and PT wrote a note to MD requesting her to order this.  Patient will benefit from skilled PT to address the below impairments and improve overall function.   OBJECTIVE IMPAIRMENTS: Abnormal gait, decreased activity tolerance, decreased balance, decreased mobility, difficulty walking, decreased strength, decreased safety awareness, impaired perceived functional ability, impaired flexibility, postural dysfunction, and pain.   ACTIVITY LIMITATIONS: carrying, lifting, sitting, standing, stairs, transfers, hygiene/grooming, and locomotion level  PARTICIPATION LIMITATIONS: meal prep, cleaning, laundry, driving, shopping, and community activity  PERSONAL FACTORS: Age, Past/current experiences, and 1-2 comorbidities: CVA, falls, Rt hip fracture with ORIF  are also affecting patient's functional outcome.   REHAB POTENTIAL: Good  CLINICAL DECISION MAKING: Evolving/moderate complexity  EVALUATION COMPLEXITY: Moderate   GOALS: Goals reviewed with patient? Yes  SHORT TERM GOALS: Target date: 09/06/2022  Be independent in initial HEP Baseline: Goal status: INITIAL  2.  Improve LE strength to perform sit to stand with moderate Lt UE support Baseline:  Goal status: INITIAL  3.  Perform TUG in < or = to 60 seconds to reduce falls risk Baseline:  Goal status: INITIAL  4.   Baseline:  Goal status: INITIAL     LONG TERM GOALS: Target date: 10/04/2022   Be independent in advanced HEP Baseline:  Goal  status: INITIAL  2.  Perform TUG in < or = to 30 seconds to reduce falls risk Baseline:  Goal status: INITIAL  3.  Improve LE strength to perform sit to stand with min to no UE support  Baseline:  Goal status: INITIAL  4.  Ambulate with hemi walker with supervision or no guard due to improved gait Baseline:  Goal status: INITIAL  5.  Demonstrate > or = to 4-/5 to 4/5 Rt hip and knee strength to improve safety and stability  Baseline:  Goal status: INITIAL  PLAN:  PT FREQUENCY: 1-2x/weekpt requested 1x/wk due to Lt shoulder pain  PT DURATION: 8 weeks  PLANNED INTERVENTIONS: Therapeutic exercises, Therapeutic activity, Neuromuscular re-education, Balance training, Gait training, Patient/Family education, Self Care, Joint mobilization, Stair training, Dry Needling, Electrical stimulation, Cryotherapy, Moist heat, Taping, Manual therapy, and Re-evaluation  PLAN FOR NEXT SESSION: review HEP, gait, balance, strength.  See if pt got an order to go to OT for Rt UE  Sigurd Sos, PT 08/09/22 3:52 PM  [facility] [address], [place], [place] [address] Phone # [phone] Fax [phone]

## 2022-08-09 ENCOUNTER — Other Ambulatory Visit: Payer: Self-pay

## 2022-08-09 ENCOUNTER — Ambulatory Visit: Payer: Medicare PPO | Attending: Family Medicine

## 2022-08-09 DIAGNOSIS — X58XXXS Exposure to other specified factors, sequela: Secondary | ICD-10-CM | POA: Diagnosis not present

## 2022-08-09 DIAGNOSIS — R296 Repeated falls: Secondary | ICD-10-CM | POA: Insufficient documentation

## 2022-08-09 DIAGNOSIS — R2689 Other abnormalities of gait and mobility: Secondary | ICD-10-CM

## 2022-08-09 DIAGNOSIS — M6281 Muscle weakness (generalized): Secondary | ICD-10-CM

## 2022-08-09 DIAGNOSIS — S72001S Fracture of unspecified part of neck of right femur, sequela: Secondary | ICD-10-CM | POA: Diagnosis not present

## 2022-08-11 ENCOUNTER — Other Ambulatory Visit: Payer: Self-pay | Admitting: Orthopaedic Surgery

## 2022-08-11 DIAGNOSIS — M25512 Pain in left shoulder: Secondary | ICD-10-CM

## 2022-08-14 ENCOUNTER — Encounter: Payer: Self-pay | Admitting: Physical Therapy

## 2022-08-14 ENCOUNTER — Ambulatory Visit: Payer: Medicare PPO | Admitting: Physical Therapy

## 2022-08-14 DIAGNOSIS — R296 Repeated falls: Secondary | ICD-10-CM | POA: Diagnosis not present

## 2022-08-14 DIAGNOSIS — M6281 Muscle weakness (generalized): Secondary | ICD-10-CM

## 2022-08-14 DIAGNOSIS — R2689 Other abnormalities of gait and mobility: Secondary | ICD-10-CM

## 2022-08-14 DIAGNOSIS — S72001S Fracture of unspecified part of neck of right femur, sequela: Secondary | ICD-10-CM | POA: Diagnosis not present

## 2022-08-14 NOTE — Therapy (Signed)
OUTPATIENT PHYSICAL THERAPY TREATMENT NOTE   Patient Name: Angela Buchanan MRN: [id number] DOB:04/05/1951, 71 y.o., female Today's Date: 08/14/2022  PCP:  Timberlake, Kathryn, MD   REFERRING PROVIDER:  Timberlake, Kathryn, MD    END OF SESSION:   PT End of Session - 08/14/22 1520     Visit Number 2    Date for PT Re-Evaluation 10/04/22    Authorization Type [employer]- visit requested    Progress Note Due on Visit 10    PT Start Time 1521    PT Stop Time 1605    PT Time Calculation (min) 44 min    Activity Tolerance Patient tolerated treatment well    Behavior During Therapy WFL for tasks assessed/performed             Past Medical History:  Diagnosis Date   Atypical chest pain 07/19/2021   Bigeminy    no current med.   Breast cancer ([facility]) 06/2017   right   Bruises easily    Carotid bruit 11/13/2019   Dental crowns present    History of diverticulitis    Hypertension    states under control with med., has been on med. x 5 yr.   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   Past Surgical History:  Procedure Laterality Date   ABDOMINAL HYSTERECTOMY     partial   BREAST LUMPECTOMY Right 06/22/2017   Malignant   BREAST LUMPECTOMY WITH RADIOACTIVE SEED AND SENTINEL LYMPH NODE BIOPSY Right 06/22/2017   Procedure: RIGHT BREAST LUMPECTOMY WITH RADIOACTIVE SEED AND RIGHT AXILLARY DEEP SENTINEL LYMPH NODE BIOPSY WITH BLUE DYE INJECTION;  Surgeon: Ingram, Haywood, MD;  Location: [facility];  Service: General;  Laterality: Right;   BREAST LUMPECTOMY WITH RADIOACTIVE SEED LOCALIZATION Left 06/22/2017   Procedure: LEFT BREAST LUMPECTOMY WITH RADIOACTIVE SEED LOCALIZATION;  Surgeon: Ingram, Haywood, MD;  Location: [facility];  Service: General;  Laterality: Left;   CATARACT EXTRACTION W/ INTRAOCULAR LENS  IMPLANT, BILATERAL Bilateral    EXCISION OF BREAST BIOPSY Left 06/22/2017   benign   LOOP RECORDER INSERTION N/A 05/16/2022   Procedure: LOOP RECORDER INSERTION;  Surgeon: Lambert, Cameron T, MD;  Location: [facility];  Service: Cardiovascular;  Laterality: N/A;   PERCUTANEOUS PINNING Right 06/22/2022   Procedure: PERCUTANEOUS PINNING OF RIGHT HIP;  Surgeon: Varkey, Dax T, MD;  Location: [facility];  Service: Orthopedics;  Laterality: Right;   TONSILLECTOMY     age 11   Patient Active Problem List   Diagnosis Date Noted   Malnutrition of moderate degree 06/22/2022   Closed fracture of femur, neck ([facility]) 06/21/2022   Urinary frequency 06/21/2022   Hypokalemia 06/21/2022   Heart murmur 06/21/2022   Hypothyroidism 06/21/2022   Dysphagia 05/17/2022   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 05/11/2020   PVC (premature ventricular contraction) 05/11/2020   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   Carotid bruit 11/13/2019   Pure hypercholesterolemia 11/13/2019   Malignant neoplasm of upper-outer quadrant of right breast in female, estrogen receptor positive ([facility]) 05/22/2017     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   Diplopia 09/17/2014   Dizziness and giddiness 09/17/2014   Essential hypertension 07/22/2014   Chest pain 07/22/2014   Family history of heart disease 07/22/2014    REFERRING DIAG:  R29.6 (ICD-10-CM) - Repeated falls, closed fracture of Rt hip    THERAPY DIAG:  Other abnormalities of gait and mobility  Muscle weakness (generalized)  Rationale for Evaluation and Treatment Rehabilitation  PERTINENT HISTORY: hypertension, breast cancer,  hyperlipidemia, Lt hip fracture secondary to fall 06/2022, CVA 06/2022   PRECAUTIONS:  Fall, Rt breast cancer with node removal, osteoporosis    SUBJECTIVE:                                                                                                                                                                                      SUBJECTIVE STATEMENT:  I hurt all over everyday both muscles and joints.    PAIN:  Are you having pain? Yes: NPRS scale: unable to give/10 Pain location: All over. Pain description: moderate pain day Aggravating factors: Constant Relieving factors: not much   OBJECTIVE: (objective measures completed at initial evaluation unless otherwise dated)  DIAGNOSTIC FINDINGS: Rt hip fracture and ORIF     COGNITION: Overall cognitive status: Within functional limits for tasks assessed                         SENSATION: WFL     POSTURE: rounded shoulders, forward head, flexed trunk , and weight shift left   PALPATION: NA   LOWER EXTREMITY ROM: Rt hip limited by 50%, hamstrings limited by 50% bil.    LOWER EXTREMITY MMT:   MMT Right eval Left eval  Hip flexion 3+ 4-  Hip extension      Hip abduction 3- 4  Hip adduction      Hip internal rotation      Hip external rotation      Knee flexion 3- 3+  Knee extension 3+ 4-  Ankle dorsiflexion 3+ 4-  Ankle plantarflexion      Ankle inversion      Ankle eversion       (Blank rows = not tested)   FUNCTIONAL TESTS:  5 times sit to stand: not able to due per pt request secondary to Lt shoulder pain with sit to stand.  Max LT UE pain with sit to stand  Timed up and go (TUG): 1 min, 27 seconds    GAIT: Distance walked: 25 feet  Assistive device utilized: Hemi walker on Lt Level of assistance: SBA Comments: slow mobility, reduced trunk rotation, reduce Rt LE stance time and reduced Rt  arm swing     TODAY'S TREATMENT:                                 08/14/22: Nustep L1 6 min with PTA present to  monitor LE only. Heel raises Bil 10x, toes lifts 10x but difficult to complete Ball squeezes 10x Seated marching 2x5 Seated knee extension 2x5  Sit to stand from chair: 50/50 use of Lt arm and legs 2x3 Vc for technique.    DATE: 08/09/22  HEP established-husband present for instruction   PATIENT EDUCATION:  Education details: Access Code: [email or web address] Person educated: Patient Education method: Explanation, Demonstration, and Handouts Education comprehension: verbalized understanding and returned demonstration   HOME EXERCISE PROGRAM: Access Code: [email or web address] URL: [email or web address] Date: 08/09/2022 Prepared by: Claiborne Billings   Exercises - Seated Long Arc Quad  - 3 x daily - 7 x weekly - 2 sets - 10 reps - 5 hold - Seated March   - 3 x daily - 7 x weekly - 3 sets - 10 reps - Seated Heel Toe Raises   - 3 x daily - 7 x weekly - 2 sets - 10 reps - Seated Isometric Hip Adduction with Ball  - 3 x daily - 7 x weekly - 2 sets - 10 reps   ASSESSMENT:   CLINICAL IMPRESSION: Pt presents with compliance of initial Hep although she has to do less reps than asked. Pt is very concerned about her pain but seemed to tolerate all her exercises without exacerbation of pain. Pt pretty fatigued after todays level of exercise.    OBJECTIVE IMPAIRMENTS: Abnormal gait, decreased activity tolerance, decreased balance, decreased mobility, difficulty walking, decreased strength, decreased safety awareness, impaired perceived functional ability, impaired flexibility, postural dysfunction, and pain.    ACTIVITY LIMITATIONS: carrying, lifting, sitting, standing, stairs, transfers, hygiene/grooming, and locomotion level   PARTICIPATION LIMITATIONS: meal prep, cleaning, laundry, driving, shopping, and community activity   PERSONAL FACTORS: Age, Past/current experiences, and 1-2 comorbidities: CVA, falls, Rt hip fracture with ORIF  are also affecting patient's functional outcome.    REHAB POTENTIAL:  Good   CLINICAL DECISION MAKING: Evolving/moderate complexity   EVALUATION COMPLEXITY: Moderate     GOALS: Goals reviewed with patient? Yes   SHORT TERM GOALS: Target date: 09/06/2022   Be independent in initial HEP Baseline: Goal status: Goal met 08/14/22   2.  Improve LE strength to perform sit to stand with moderate Lt UE support Baseline:  Goal status: INITIAL   3.  Perform TUG in < or = to 60 seconds to reduce falls risk Baseline:  Goal status: INITIAL          LONG TERM GOALS: Target date: 10/04/2022    Be independent in advanced HEP Baseline:  Goal status: INITIAL   2.  Perform TUG in < or = to 30 seconds to reduce falls risk Baseline:  Goal status: INITIAL   3.  Improve LE strength to perform sit to stand with min to no UE support  Baseline:  Goal status: INITIAL   4.  Ambulate with hemi walker with supervision or no guard due to improved gait Baseline:  Goal status: INITIAL   5.  Demonstrate > or = to 4-/5 to 4/5 Rt hip and knee strength to improve safety and stability  Baseline:  Goal status: INITIAL     PLAN:   PT FREQUENCY: 1-2x/weekpt requested 1x/wk due to Lt shoulder pain  PT DURATION: 8 weeks   PLANNED INTERVENTIONS: Therapeutic exercises, Therapeutic activity, Neuromuscular re-education, Balance training, Gait training, Patient/Family education, Self Care, Joint mobilization, Stair training, Dry Needling, Electrical stimulation, Cryotherapy, Moist heat, Taping, Manual therapy, and Re-evaluation   PLAN FOR NEXT SESSION: Progress TE for LE slowly as pt is prone to fatigue.   Willis Kuipers, PTA 08/14/2022, 4:04 PM

## 2022-08-15 DIAGNOSIS — I10 Essential (primary) hypertension: Secondary | ICD-10-CM | POA: Diagnosis not present

## 2022-08-21 NOTE — Progress Notes (Signed)
Bsx loop recorder

## 2022-08-23 ENCOUNTER — Other Ambulatory Visit: Payer: Self-pay

## 2022-08-23 NOTE — Patient Outreach (Signed)
First telephone outreach attempt to obtain mRS. No answer. Left message for returned call.  Caylah Plouff [facility]-Care Management Assistant [phone]  

## 2022-08-23 NOTE — Patient Outreach (Signed)
Telephone outreach to patient to obtain mRS was successfully completed. MRS= 3  Angela Buchanan [facility] Care Management Assistant [phone]  

## 2022-08-27 NOTE — Therapy (Signed)
OUTPATIENT PHYSICAL THERAPY TREATMENT NOTE   Patient Name: Angela Buchanan MRN: [id number] DOB:1950-10-26, 71 y.o., female Today's Date: 08/28/2022  PCP:  Sela Hilding, MD   REFERRING PROVIDER:  Sela Hilding, MD    END OF SESSION:   PT End of Session - 08/28/22 1357     Visit Number 3    Date for PT Re-Evaluation 10/04/22    Authorization Type [employer]- visit requested    Progress Note Due on Visit 10    PT Start Time 7902    PT Stop Time 1442    PT Time Calculation (min) 45 min    Activity Tolerance Patient tolerated treatment well    Behavior During Therapy [facility] for tasks assessed/performed              Past Medical History:  Diagnosis Date   Atypical chest pain 07/19/2021   Bigeminy    no current med.   Breast cancer ([place]) 06/2017   right   Bruises easily    Carotid bruit 11/13/2019   Dental crowns present    History of diverticulitis    Hypertension    states under control with med., has been on med. x 5 yr.   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   Past Surgical History:  Procedure Laterality Date   ABDOMINAL HYSTERECTOMY     partial   BREAST LUMPECTOMY Right 06/22/2017   Malignant   BREAST LUMPECTOMY WITH RADIOACTIVE SEED AND SENTINEL LYMPH NODE BIOPSY Right 06/22/2017   Procedure: RIGHT BREAST LUMPECTOMY WITH RADIOACTIVE SEED AND RIGHT AXILLARY DEEP SENTINEL LYMPH NODE BIOPSY WITH BLUE DYE INJECTION;  Surgeon: Fanny Skates, MD;  Location: [place];  Service: General;  Laterality: Right;   BREAST LUMPECTOMY WITH RADIOACTIVE SEED LOCALIZATION Left 06/22/2017   Procedure: LEFT BREAST LUMPECTOMY WITH RADIOACTIVE SEED LOCALIZATION;  Surgeon: Fanny Skates, MD;  Location: [place];  Service: General;  Laterality: Left;   CATARACT EXTRACTION W/ INTRAOCULAR LENS  IMPLANT, BILATERAL Bilateral    EXCISION OF BREAST BIOPSY Left 06/22/2017   benign   LOOP RECORDER INSERTION N/A 05/16/2022   Procedure: LOOP RECORDER INSERTION;  Surgeon: Vickie Epley, MD;  Location: [place] [facility];  Service: Cardiovascular;  Laterality: N/A;   PERCUTANEOUS PINNING Right 06/22/2022   Procedure: PERCUTANEOUS PINNING OF RIGHT HIP;  Surgeon: Hiram Gash, MD;  [facility];  Service: Orthopedics;  Laterality: Right;   TONSILLECTOMY     age 26   Patient Active Problem List   Diagnosis Date Noted   Malnutrition of moderate degree 06/22/2022   Closed fracture of femur, neck ([place]) 06/21/2022   Urinary frequency 06/21/2022   Hypokalemia 06/21/2022   Heart murmur 06/21/2022   Hypothyroidism 06/21/2022   Dysphagia 05/17/2022   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   CVA (cerebral vascular accident) ([place]) 05/12/2022   Osteoporosis 01/24/2022   Atypical chest pain 07/19/2021   Temporomandibular joint (TMJ) pain 07/16/2020   Referred otalgia of both ears 07/16/2020   PAC (premature atrial contraction) 05/11/2020   PVC (premature ventricular contraction) 05/11/2020   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   Carotid bruit 11/13/2019   Pure hypercholesterolemia 11/13/2019   Malignant neoplasm of upper-outer quadrant of right breast in female, estrogen receptor positive ([place]) 05/22/2017  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   Diplopia 09/17/2014   Dizziness and giddiness 09/17/2014   Essential hypertension 07/22/2014   Chest pain 07/22/2014   Family history of heart disease 07/22/2014    REFERRING DIAG:  R29.6 (ICD-10-CM) - Repeated falls, closed fracture of Rt hip    THERAPY DIAG:  Muscle weakness (generalized)  Other abnormalities of gait and mobility  Abnormal posture  Malignant neoplasm of upper-outer quadrant of right breast in female, estrogen  receptor positive ([facility])  Stiffness of right shoulder, not elsewhere classified  Rationale for Evaluation and Treatment Rehabilitation  PERTINENT HISTORY: hypertension, breast cancer, hyperlipidemia, Lt hip fracture secondary to fall 06/2022, CVA 06/2022   PRECAUTIONS:  Fall, Rt breast cancer with node removal, osteoporosis    SUBJECTIVE:                                                                                                                                                                                      SUBJECTIVE STATEMENT: I felt ok after my last time here. Rt hand is swollen with Rt arm pain. Sees MD on Thursday and plans to ask about hand and arm pain.   PAIN:  Are you having pain? Yes: NPRS scale: unable to give 7-8/10 Pain location: Rt arm. . Pain description: moderate pain day Aggravating factors: Constant Relieving factors: not much   OBJECTIVE: (objective measures completed at initial evaluation unless otherwise dated)  DIAGNOSTIC FINDINGS: Rt hip fracture and ORIF     COGNITION: Overall cognitive status: Within functional limits for tasks assessed                         SENSATION: WFL     POSTURE: rounded shoulders, forward head, flexed trunk , and weight shift left   PALPATION: NA   LOWER EXTREMITY ROM: Rt hip limited by 50%, hamstrings limited by 50% bil.    LOWER EXTREMITY MMT:   MMT Right eval Left eval  Hip flexion 3+ 4-  Hip extension      Hip abduction 3- 4  Hip adduction      Hip internal rotation      Hip external rotation      Knee flexion 3- 3+  Knee extension 3+ 4-  Ankle dorsiflexion 3+ 4-  Ankle plantarflexion      Ankle inversion      Ankle eversion       (Blank rows = not tested)   FUNCTIONAL TESTS:  5 times sit to stand: not able to due per pt request secondary to Lt shoulder pain with sit to stand.  Max LT UE pain with sit  to stand  Timed up and go (TUG): 1 min, 27 seconds    GAIT: Distance walked: 25 feet   Assistive device utilized: Hemi walker on Lt Level of assistance: SBA Comments: slow mobility, reduced trunk rotation, reduce Rt LE stance time and reduced Rt arm swing     TODAY'S TREATMENT:       08/28/22:  Ball squeeze 2x6 Seated clamshells: green loop 2x6 Seated marching 1# added 6x Bil LAQ 1# 6x2 Bil   Sit to stand with only LT UE 70% LE: 2x5 Nustep L1 7 min with PTA present to monitor  GAIT:   Hand held in LTUE: 26 feet 2x, then 3rd time with hemiwalker trying to advance walker with RTLE to make cadence smoother.                        08/14/22: Nustep L1 6 min with PTA present to monitor LE only. Heel raises Bil 10x, toes lifts 10x but difficult to complete Ball squeezes 10x Seated marching 2x5 Seated knee extension 2x5  Sit to stand from chair: 50/50 use of Lt arm and legs 2x3 Vc for technique.    DATE: 08/09/22  HEP established-husband present for instruction   PATIENT EDUCATION:  Education details: Access Code: [email or web address] Person educated: Patient Education method: Explanation, Demonstration, and Handouts Education comprehension: verbalized understanding and returned demonstration   HOME EXERCISE PROGRAM: Access Code: [email or web address] URL: [email or web address] Date: 08/09/2022 Prepared by: Claiborne Billings   Exercises - Seated Long Arc Quad  - 3 x daily - 7 x weekly - 2 sets - 10 reps - 5 hold - Seated March   - 3 x daily - 7 x weekly - 3 sets - 10 reps - Seated Heel Toe Raises   - 3 x daily - 7 x weekly - 2 sets - 10 reps - Seated Isometric Hip Adduction with Ball  - 3 x daily - 7 x weekly - 2 sets - 10 reps   ASSESSMENT:   CLINICAL IMPRESSION: Pt able to tolerate 1# on LE today but only completes small # of reps. Pt will fatigue quickly. Today we worked on the cadence of her gait. She reports she wants to walk smoother.    OBJECTIVE IMPAIRMENTS: Abnormal gait, decreased activity tolerance, decreased balance, decreased mobility, difficulty walking, decreased  strength, decreased safety awareness, impaired perceived functional ability, impaired flexibility, postural dysfunction, and pain.    ACTIVITY LIMITATIONS: carrying, lifting, sitting, standing, stairs, transfers, hygiene/grooming, and locomotion level   PARTICIPATION LIMITATIONS: meal prep, cleaning, laundry, driving, shopping, and community activity   PERSONAL FACTORS: Age, Past/current experiences, and 1-2 comorbidities: CVA, falls, Rt hip fracture with ORIF  are also affecting patient's functional outcome.    REHAB POTENTIAL: Good   CLINICAL DECISION MAKING: Evolving/moderate complexity   EVALUATION COMPLEXITY: Moderate     GOALS: Goals reviewed with patient? Yes   SHORT TERM GOALS: Target date: 09/06/2022   Be independent in initial HEP Baseline: Goal status: Goal met 08/14/22   2.  Improve LE strength to perform sit to stand with moderate Lt UE support Baseline:  Goal status: Goal met 08/28/22  3.  Perform TUG in < or = to 60 seconds to reduce falls risk Baseline:  Goal status: INITIAL          LONG TERM GOALS: Target date: 10/04/2022    Be independent in advanced HEP Baseline:  Goal status: INITIAL   2.  Perform TUG in < or = to  30 seconds to reduce falls risk Baseline:  Goal status: INITIAL   3.  Improve LE strength to perform sit to stand with min to no UE support  Baseline:  Goal status: INITIAL   4.  Ambulate with hemi walker with supervision or no guard due to improved gait Baseline:  Goal status: INITIAL   5.  Demonstrate > or = to 4-/5 to 4/5 Rt hip and knee strength to improve safety and stability  Baseline:  Goal status: INITIAL     PLAN:   PT FREQUENCY: 1-2x/weekpt requested 1x/wk due to Lt shoulder pain   PT DURATION: 8 weeks   PLANNED INTERVENTIONS: Therapeutic exercises, Therapeutic activity, Neuromuscular re-education, Balance training, Gait training, Patient/Family education, Self Care, Joint mobilization, Stair training, Dry  Needling, Electrical stimulation, Cryotherapy, Moist heat, Taping, Manual therapy, and Re-evaluation   PLAN FOR NEXT SESSION: Progress TE for LE slowly as pt is prone to fatigue.   Chevelle Coulson, PTA 08/28/2022, 2:41 PM

## 2022-08-28 ENCOUNTER — Encounter: Payer: Self-pay | Admitting: Physical Therapy

## 2022-08-28 ENCOUNTER — Ambulatory Visit (INDEPENDENT_AMBULATORY_CARE_PROVIDER_SITE_OTHER): Payer: Medicare PPO

## 2022-08-28 ENCOUNTER — Ambulatory Visit: Payer: Medicare PPO | Admitting: Physical Therapy

## 2022-08-28 DIAGNOSIS — S72001S Fracture of unspecified part of neck of right femur, sequela: Secondary | ICD-10-CM | POA: Diagnosis not present

## 2022-08-28 DIAGNOSIS — M6281 Muscle weakness (generalized): Secondary | ICD-10-CM

## 2022-08-28 DIAGNOSIS — R293 Abnormal posture: Secondary | ICD-10-CM

## 2022-08-28 DIAGNOSIS — M25611 Stiffness of right shoulder, not elsewhere classified: Secondary | ICD-10-CM

## 2022-08-28 DIAGNOSIS — I639 Cerebral infarction, unspecified: Secondary | ICD-10-CM | POA: Diagnosis not present

## 2022-08-28 DIAGNOSIS — C50411 Malignant neoplasm of upper-outer quadrant of right female breast: Secondary | ICD-10-CM

## 2022-08-28 DIAGNOSIS — R296 Repeated falls: Secondary | ICD-10-CM | POA: Diagnosis not present

## 2022-08-28 DIAGNOSIS — R2689 Other abnormalities of gait and mobility: Secondary | ICD-10-CM

## 2022-08-28 DIAGNOSIS — Z170 Estrogen receptor positive status [ER+]: Secondary | ICD-10-CM

## 2022-08-30 LAB — CUP PACEART REMOTE DEVICE CHECK
Date Time Interrogation Session: 20231129144600
Implantable Pulse Generator Implant Date: 20230815
Pulse Gen Serial Number: 183424

## 2022-08-31 DIAGNOSIS — M79641 Pain in right hand: Secondary | ICD-10-CM | POA: Diagnosis not present

## 2022-08-31 DIAGNOSIS — E440 Moderate protein-calorie malnutrition: Secondary | ICD-10-CM | POA: Diagnosis not present

## 2022-08-31 DIAGNOSIS — Z79899 Other long term (current) drug therapy: Secondary | ICD-10-CM | POA: Diagnosis not present

## 2022-09-04 ENCOUNTER — Ambulatory Visit: Payer: Medicare PPO | Attending: Family Medicine | Admitting: Physical Therapy

## 2022-09-04 ENCOUNTER — Encounter: Payer: Self-pay | Admitting: Physical Therapy

## 2022-09-04 DIAGNOSIS — R278 Other lack of coordination: Secondary | ICD-10-CM | POA: Insufficient documentation

## 2022-09-04 DIAGNOSIS — R293 Abnormal posture: Secondary | ICD-10-CM | POA: Diagnosis not present

## 2022-09-04 DIAGNOSIS — R2689 Other abnormalities of gait and mobility: Secondary | ICD-10-CM | POA: Insufficient documentation

## 2022-09-04 DIAGNOSIS — Z170 Estrogen receptor positive status [ER+]: Secondary | ICD-10-CM | POA: Insufficient documentation

## 2022-09-04 DIAGNOSIS — R2681 Unsteadiness on feet: Secondary | ICD-10-CM | POA: Insufficient documentation

## 2022-09-04 DIAGNOSIS — C50411 Malignant neoplasm of upper-outer quadrant of right female breast: Secondary | ICD-10-CM | POA: Insufficient documentation

## 2022-09-04 DIAGNOSIS — M6281 Muscle weakness (generalized): Secondary | ICD-10-CM | POA: Diagnosis not present

## 2022-09-04 DIAGNOSIS — I69354 Hemiplegia and hemiparesis following cerebral infarction affecting left non-dominant side: Secondary | ICD-10-CM | POA: Diagnosis not present

## 2022-09-04 DIAGNOSIS — M79601 Pain in right arm: Secondary | ICD-10-CM | POA: Insufficient documentation

## 2022-09-04 DIAGNOSIS — M25611 Stiffness of right shoulder, not elsewhere classified: Secondary | ICD-10-CM | POA: Insufficient documentation

## 2022-09-04 NOTE — Therapy (Signed)
OUTPATIENT PHYSICAL THERAPY TREATMENT NOTE   Patient Name: Angela Buchanan MRN: [id number] DOB:1951-01-21, 71 y.o., female Today's Date: 09/04/2022  PCP:  Sela Hilding, MD   REFERRING PROVIDER:  Sela Hilding, MD    END OF SESSION:   PT End of Session - 09/04/22 1450     Visit Number 4    Date for PT Re-Evaluation 10/04/22    Authorization Type [employer]- visit requested    Progress Note Due on Visit 10    PT Start Time 3546    PT Stop Time 1527    PT Time Calculation (min) 40 min    Activity Tolerance Patient tolerated treatment well    Behavior During Therapy [facility] for tasks assessed/performed               Past Medical History:  Diagnosis Date   Atypical chest pain 07/19/2021   Bigeminy    no current med.   Breast cancer ([place]) 06/2017   right   Bruises easily    Carotid bruit 11/13/2019   Dental crowns present    History of diverticulitis    Hypertension    states under control with med., has been on med. x 5 yr.   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   Past Surgical History:  Procedure Laterality Date   ABDOMINAL HYSTERECTOMY     partial   BREAST LUMPECTOMY Right 06/22/2017   Malignant   BREAST LUMPECTOMY WITH RADIOACTIVE SEED AND SENTINEL LYMPH NODE BIOPSY Right 06/22/2017   Procedure: RIGHT BREAST LUMPECTOMY WITH RADIOACTIVE SEED AND RIGHT AXILLARY DEEP SENTINEL LYMPH NODE BIOPSY WITH BLUE DYE INJECTION;  Surgeon: Fanny Skates, MD;  Location: [place];  Service: General;  Laterality: Right;   BREAST LUMPECTOMY WITH RADIOACTIVE SEED LOCALIZATION Left 06/22/2017   Procedure: LEFT BREAST LUMPECTOMY WITH RADIOACTIVE SEED LOCALIZATION;  Surgeon: Fanny Skates, MD;  Location: [place];  Service: General;  Laterality: Left;   CATARACT EXTRACTION W/ INTRAOCULAR LENS  IMPLANT, BILATERAL Bilateral    EXCISION OF BREAST BIOPSY Left 06/22/2017   benign   LOOP RECORDER INSERTION N/A 05/16/2022   Procedure: LOOP RECORDER INSERTION;  Surgeon: Vickie Epley, MD;  Location: [place] [facility];  Service: Cardiovascular;  Laterality: N/A;   PERCUTANEOUS PINNING Right 06/22/2022   Procedure: PERCUTANEOUS PINNING OF RIGHT HIP;  Surgeon: Hiram Gash, MD;  [facility];  Service: Orthopedics;  Laterality: Right;   TONSILLECTOMY     age 30   Patient Active Problem List   Diagnosis Date Noted   Malnutrition of moderate degree 06/22/2022   Closed fracture of femur, neck ([place]) 06/21/2022   Urinary frequency 06/21/2022   Hypokalemia 06/21/2022   Heart murmur 06/21/2022   Hypothyroidism 06/21/2022   Dysphagia 05/17/2022   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   CVA (cerebral vascular accident) ([place]) 05/12/2022   Osteoporosis 01/24/2022   Atypical chest pain 07/19/2021   Temporomandibular joint (TMJ) pain 07/16/2020   Referred otalgia of both ears 07/16/2020   PAC (premature atrial contraction) 05/11/2020   PVC (premature ventricular contraction) 05/11/2020   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   Carotid bruit 11/13/2019   Pure hypercholesterolemia 11/13/2019   Malignant neoplasm of upper-outer quadrant of right breast in female, estrogen receptor positive ([place]) 05/22/2017  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   Diplopia 09/17/2014   Dizziness and giddiness 09/17/2014   Essential hypertension 07/22/2014   Chest pain 07/22/2014   Family history of heart disease 07/22/2014    REFERRING DIAG:  R29.6 (ICD-10-CM) - Repeated falls, closed fracture of Rt hip    THERAPY DIAG:  Muscle weakness (generalized)  Other abnormalities of gait and mobility  Abnormal posture  Rationale for Evaluation and Treatment Rehabilitation  PERTINENT HISTORY:  hypertension, breast cancer, hyperlipidemia, Lt hip fracture secondary to fall 06/2022, CVA 06/2022   PRECAUTIONS:  Fall, Rt breast cancer with node removal, osteoporosis    SUBJECTIVE:                                                                                                                                                                                      SUBJECTIVE STATEMENT: My whole right side was sore after last session. I was better the next day. MD thinks my Rt arm is hurting bc I am not using it as much as I could. She gave me an order for OT. Rt hand/fingers swollen. PAIN:  Are you having pain? Yes: NPRS scale: unable to give 04/08/09 Pain location: Rt arm. . Pain description: moderate pain day Aggravating factors: Constant Relieving factors: not much   OBJECTIVE: (objective measures completed at initial evaluation unless otherwise dated)  DIAGNOSTIC FINDINGS: Rt hip fracture and ORIF     COGNITION: Overall cognitive status: Within functional limits for tasks assessed                         SENSATION: WFL     POSTURE: rounded shoulders, forward head, flexed trunk , and weight shift left   PALPATION: NA   LOWER EXTREMITY ROM: Rt hip limited by 50%, hamstrings limited by 50% bil.    LOWER EXTREMITY MMT:   MMT Right eval Left eval  Hip flexion 3+ 4-  Hip extension      Hip abduction 3- 4  Hip adduction      Hip internal rotation      Hip external rotation      Knee flexion 3- 3+  Knee extension 3+ 4-  Ankle dorsiflexion 3+ 4-  Ankle plantarflexion      Ankle inversion      Ankle eversion       (Blank rows = not tested)   FUNCTIONAL TESTS:  5 times sit to stand: not able to due per pt request secondary to Lt shoulder pain with sit to stand.  Max LT UE pain with sit to stand  Timed up and go (TUG): 1 min, 27  seconds    GAIT: Distance walked: 25 feet  Assistive device utilized: Hemi walker on Lt Level of assistance: SBA Comments: slow mobility,  reduced trunk rotation, reduce Rt LE stance time and reduced Rt arm swing     TODAY'S TREATMENT:       09/04/22: Nustep L1 5 min with PTA present to monitor LAQ 1# Lt 7x, 5x, RT 7x, 3x Seated marching:1# 9x LT, 6x RT Seated ball squeezes 2x7 Seated clamshells blue loop 2x7 Sit to stand from chair: 2x6   GAIT:   Hand held in LTUE: 26 feet 3x alternating between HHA from PTA and using hemi walker with focus on increasing step length and better management of th ewalker.   08/28/22:  Ball squeeze 2x6 Seated clamshells: green loop 2x6 Seated marching 1# added 6x Bil LAQ 1# 6x2 Bil   Sit to stand with only LT UE 70% LE: 2x5 Nustep L1 7 min with PTA present to monitor  GAIT:   Hand held in LTUE: 26 feet 2x, then 3rd time with hemiwalker trying to advance walker with RTLE to make cadence smoother.                        08/14/22: Nustep L1 6 min with PTA present to monitor LE only. Heel raises Bil 10x, toes lifts 10x but difficult to complete Ball squeezes 10x Seated marching 2x5 Seated knee extension 2x5  Sit to stand from chair: 50/50 use of Lt arm and legs 2x3 Vc for technique.    DATE: 08/09/22  HEP established-husband present for instruction   PATIENT EDUCATION:  Education details: Access Code: [email or web address] Person educated: Patient Education method: Explanation, Demonstration, and Handouts Education comprehension: verbalized understanding and returned demonstration   HOME EXERCISE PROGRAM: Access Code: [email or web address] URL: [email or web address] Date: 08/09/2022 Prepared by: Claiborne Billings   Exercises - Seated Long Arc Quad  - 3 x daily - 7 x weekly - 2 sets - 10 reps - 5 hold - Seated March   - 3 x daily - 7 x weekly - 3 sets - 10 reps - Seated Heel Toe Raises   - 3 x daily - 7 x weekly - 2 sets - 10 reps - Seated Isometric Hip Adduction with Ball  - 3 x daily - 7 x weekly - 2 sets - 10 reps   ASSESSMENT:   CLINICAL IMPRESSION: Pt saw MD last week. Pt got an order for OT  and will work on scheduling this. Pt reports she was very sore 1x after PT last week but had good recovery the next day. We kept the resisatance the same for LE weights but encouraged 1-2 more reps if she could.    OBJECTIVE IMPAIRMENTS: Abnormal gait, decreased activity tolerance, decreased balance, decreased mobility, difficulty walking, decreased strength, decreased safety awareness, impaired perceived functional ability, impaired flexibility, postural dysfunction, and pain.    ACTIVITY LIMITATIONS: carrying, lifting, sitting, standing, stairs, transfers, hygiene/grooming, and locomotion level   PARTICIPATION LIMITATIONS: meal prep, cleaning, laundry, driving, shopping, and community activity   PERSONAL FACTORS: Age, Past/current experiences, and 1-2 comorbidities: CVA, falls, Rt hip fracture with ORIF  are also affecting patient's functional outcome.    REHAB POTENTIAL: Good   CLINICAL DECISION MAKING: Evolving/moderate complexity   EVALUATION COMPLEXITY: Moderate     GOALS: Goals reviewed with patient? Yes   SHORT TERM GOALS: Target date: 09/06/2022   Be independent in initial HEP Baseline: Goal status: Goal met 08/14/22   2.  Improve LE strength to perform sit to stand with moderate Lt UE support Baseline:  Goal status: Goal met 08/28/22  3.  Perform TUG in < or = to 60 seconds to reduce falls risk Baseline:  Goal status: INITIAL          LONG TERM GOALS: Target date: 10/04/2022    Be independent in advanced HEP Baseline:  Goal status: INITIAL   2.  Perform TUG in < or = to 30 seconds to reduce falls risk Baseline:  Goal status: INITIAL   3.  Improve LE strength to perform sit to stand with min to no UE support  Baseline:  Goal status: INITIAL   4.  Ambulate with hemi walker with supervision or no guard due to improved gait Baseline:  Goal status: INITIAL   5.  Demonstrate > or = to 4-/5 to 4/5 Rt hip and knee strength to improve safety and stability   Baseline:  Goal status: INITIAL     PLAN:   PT FREQUENCY: 1-2x/weekpt requested 1x/wk due to Lt shoulder pain   PT DURATION: 8 weeks   PLANNED INTERVENTIONS: Therapeutic exercises, Therapeutic activity, Neuromuscular re-education, Balance training, Gait training, Patient/Family education, Self Care, Joint mobilization, Stair training, Dry Needling, Electrical stimulation, Cryotherapy, Moist heat, Taping, Manual therapy, and Re-evaluation   PLAN FOR NEXT SESSION: Progress TE for LE slowly as pt is prone to fatigue.   Jazion Atteberry, PTA 09/04/2022, 3:27 PM

## 2022-09-11 ENCOUNTER — Ambulatory Visit: Payer: Medicare PPO | Admitting: Physical Therapy

## 2022-09-11 ENCOUNTER — Encounter: Payer: Self-pay | Admitting: Physical Therapy

## 2022-09-11 ENCOUNTER — Ambulatory Visit: Payer: Medicare PPO | Admitting: Occupational Therapy

## 2022-09-11 DIAGNOSIS — M25611 Stiffness of right shoulder, not elsewhere classified: Secondary | ICD-10-CM

## 2022-09-11 DIAGNOSIS — I69354 Hemiplegia and hemiparesis following cerebral infarction affecting left non-dominant side: Secondary | ICD-10-CM | POA: Diagnosis not present

## 2022-09-11 DIAGNOSIS — R2681 Unsteadiness on feet: Secondary | ICD-10-CM | POA: Diagnosis not present

## 2022-09-11 DIAGNOSIS — R293 Abnormal posture: Secondary | ICD-10-CM | POA: Diagnosis not present

## 2022-09-11 DIAGNOSIS — R2689 Other abnormalities of gait and mobility: Secondary | ICD-10-CM

## 2022-09-11 DIAGNOSIS — Z170 Estrogen receptor positive status [ER+]: Secondary | ICD-10-CM

## 2022-09-11 DIAGNOSIS — M6281 Muscle weakness (generalized): Secondary | ICD-10-CM | POA: Diagnosis not present

## 2022-09-11 DIAGNOSIS — C50411 Malignant neoplasm of upper-outer quadrant of right female breast: Secondary | ICD-10-CM | POA: Diagnosis not present

## 2022-09-11 DIAGNOSIS — M79601 Pain in right arm: Secondary | ICD-10-CM | POA: Diagnosis not present

## 2022-09-11 DIAGNOSIS — R278 Other lack of coordination: Secondary | ICD-10-CM | POA: Diagnosis not present

## 2022-09-11 NOTE — Therapy (Signed)
OUTPATIENT PHYSICAL THERAPY TREATMENT NOTE   Patient Name: Angela Buchanan MRN: [id number] DOB:[birth year], 71 y.o., female Today's Date: 09/11/2022  PCP:  Sela Hilding, MD   REFERRING PROVIDER:  Sela Hilding, MD    END OF SESSION:   PT End of Session - 09/11/22 1447     Visit Number 5    Date for PT Re-Evaluation 10/04/22    Authorization Type [employer]- visit requested    Progress Note Due on Visit 10    PT Start Time 8841    PT Stop Time 1530    PT Time Calculation (min) 45 min    Activity Tolerance Patient tolerated treatment well    Behavior During Therapy [facility] for tasks assessed/performed                Past Medical History:  Diagnosis Date   Atypical chest pain 07/19/2021   Bigeminy    no current med.   Breast cancer ([place]) 06/2017   right   Bruises easily    Carotid bruit 11/13/2019   Dental crowns present    History of diverticulitis    Hypertension    states under control with med., has been on med. x 5 yr.   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   Past Surgical History:  Procedure Laterality Date   ABDOMINAL HYSTERECTOMY     partial   BREAST LUMPECTOMY Right 06/22/2017   Malignant   BREAST LUMPECTOMY WITH RADIOACTIVE SEED AND SENTINEL LYMPH NODE BIOPSY Right 06/22/2017   Procedure: RIGHT BREAST LUMPECTOMY WITH RADIOACTIVE SEED AND RIGHT AXILLARY DEEP SENTINEL LYMPH NODE BIOPSY WITH BLUE DYE INJECTION;  Surgeon: Fanny Skates, MD;  Location: [place];  Service: General;  Laterality: Right;   BREAST LUMPECTOMY WITH RADIOACTIVE SEED LOCALIZATION Left 06/22/2017   Procedure: LEFT BREAST LUMPECTOMY WITH RADIOACTIVE SEED LOCALIZATION;  Surgeon: Fanny Skates, MD;  Location: [place];  Service: General;  Laterality: Left;   CATARACT EXTRACTION W/ INTRAOCULAR LENS  IMPLANT, BILATERAL Bilateral    EXCISION OF BREAST BIOPSY Left 06/22/2017   benign   LOOP RECORDER INSERTION N/A 05/16/2022   Procedure: LOOP RECORDER INSERTION;  Surgeon: Vickie Epley, MD;  Location: [place] [facility];  Service: Cardiovascular;  Laterality: N/A;   PERCUTANEOUS PINNING Right 06/22/2022   Procedure: PERCUTANEOUS PINNING OF RIGHT HIP;  Surgeon: Hiram Gash, MD;  [facility];  Service: Orthopedics;  Laterality: Right;   TONSILLECTOMY     age 73   Patient Active Problem List   Diagnosis Date Noted   Malnutrition of moderate degree 06/22/2022   Closed fracture of femur, neck ([place]) 06/21/2022   Urinary frequency 06/21/2022   Hypokalemia 06/21/2022   Heart murmur 06/21/2022   Hypothyroidism 06/21/2022   Dysphagia 05/17/2022   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   CVA (cerebral vascular accident) ([place]) 05/12/2022   Osteoporosis 01/24/2022   Atypical chest pain 07/19/2021   Temporomandibular joint (TMJ) pain 07/16/2020   Referred otalgia of both ears 07/16/2020   PAC (premature atrial contraction) 05/11/2020   PVC (premature ventricular contraction) 05/11/2020   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   Carotid bruit 11/13/2019   Pure hypercholesterolemia 11/13/2019   Malignant neoplasm of upper-outer quadrant of right breast in female, estrogen receptor positive ([place])  05/22/2017   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   Diplopia 09/17/2014   Dizziness and giddiness 09/17/2014   Essential hypertension 07/22/2014   Chest pain 07/22/2014   Family history of heart disease 07/22/2014    REFERRING DIAG:  R29.6 (ICD-10-CM) - Repeated falls, closed fracture of Rt hip    THERAPY DIAG:  Muscle weakness (generalized)  Other abnormalities of gait and mobility  Abnormal posture  Malignant neoplasm of upper-outer quadrant of right breast in female, estrogen  receptor positive (HCC)  Stiffness of right shoulder, not elsewhere classified  Rationale for Evaluation and Treatment Rehabilitation  PERTINENT HISTORY: hypertension, breast cancer, hyperlipidemia, Lt hip fracture secondary to fall 06/2022, CVA 06/2022   PRECAUTIONS:  Fall, Rt breast cancer with node removal, osteoporosis    SUBJECTIVE:                                                                                                                                                                                      SUBJECTIVE STATEMENT: I was not as sore as I was the previous time. I was a little bit for about a day.   PAIN:  Are you having pain? Not right now but my right hand and arm can be very sore at times.    OBJECTIVE: (objective measures completed at initial evaluation unless otherwise dated)  DIAGNOSTIC FINDINGS: Rt hip fracture and ORIF     COGNITION: Overall cognitive status: Within functional limits for tasks assessed                         SENSATION: WFL     POSTURE: rounded shoulders, forward head, flexed trunk , and weight shift left   PALPATION: NA   LOWER EXTREMITY ROM: Rt hip limited by 50%, hamstrings limited by 50% bil.    LOWER EXTREMITY MMT:   MMT Right eval Left eval  Hip flexion 3+ 4-  Hip extension      Hip abduction 3- 4  Hip adduction      Hip internal rotation      Hip external rotation      Knee flexion 3- 3+  Knee extension 3+ 4-  Ankle dorsiflexion 3+ 4-  Ankle plantarflexion      Ankle inversion      Ankle eversion       (Blank rows = not tested)   FUNCTIONAL TESTS:  5 times sit to stand: not able to due per pt request secondary to Lt shoulder pain with sit to stand.  Max LT UE pain with sit to stand  Timed up and go (TUG): 1 min, 27 seconds  GAIT: Distance walked: 25 feet  Assistive device utilized: Hemi walker on Lt Level of assistance: SBA Comments: slow mobility, reduced trunk rotation, reduce Rt LE stance time and  reduced Rt arm swing     TODAY'S TREATMENT:      09/11/22: Nustep L1 6 min with PTA present to monitor LAQ 1.5# Bil 10x  Seated marching:1.5# 10x Bil Seated ball squeezes 10x2 Seated clamshells blue loop 2x7 Sit to stand from chair: 8x Side stepping over low hurdle (2) with LTUE on the counter top. PTA with CGA for safety  GAIT:   Hand held in [place]: 100 feet 2x alternating between HHA from PTA then 133fet 2x with hemiwalker and SBA  09/04/22: Nustep L1 5 min with PTA present to monitor LAQ 1# Lt 7x, 5x, RT 7x, 3x Seated marching:1# 9x LT, 6x RT Seated ball squeezes 2x7 Seated clamshells blue loop 2x7 Sit to stand from chair: 2x6   GAIT:   Hand held in LTUE: 26 feet 3x alternating between HHA from PTA and using hemi walker with focus on increasing step length and better management of th ewalker.   08/28/22:  Ball squeeze 2x6 Seated clamshells: green loop 2x6 Seated marching 1# added 6x Bil LAQ 1# 6x2 Bil   Sit to stand with only LT UE 70% LE: 2x5 Nustep L1 7 min with PTA present to monitor  GAIT:   Hand held in LTUE: 26 feet 2x, then 3rd time with hemiwalker trying to advance walker with RTLE to make cadence smoother.                        08/14/22: Nustep L1 6 min with PTA present to monitor LE only. Heel raises Bil 10x, toes lifts 10x but difficult to complete Ball squeezes 10x Seated marching 2x5 Seated knee extension 2x5  Sit to stand from chair: 50/50 use of Lt arm and legs 2x3 Vc for technique.    DATE: 08/09/22  HEP established-husband present for instruction   PATIENT EDUCATION:  Education details: Access Code: [email or web address] educated: Patient Education method: Explanation, Demonstration, and Handouts Education comprehension: verbalized understanding and returned demonstration   HOME EXERCISE PROGRAM: Access Code: [email or web address]: [email or web address] Date: 08/09/2022 Prepared by: KClaiborne Billings  Exercises - Seated Long Arc Quad  - 3 x daily - 7 x  weekly - 2 sets - 10 reps - 5 hold - Seated March   - 3 x daily - 7 x weekly - 3 sets - 10 reps - Seated Heel Toe Raises   - 3 x daily - 7 x weekly - 2 sets - 10 reps - Seated Isometric Hip Adduction with Ball  - 3 x daily - 7 x weekly - 2 sets - 10 reps   ASSESSMENT:   CLINICAL IMPRESSION: Pt was able to increase her overall workload without fatigue and/or pain. She has her OT eval this Wednesday where she will address her RT hand and arm pain. Pt was able to ambulate [id number] with her hemiwalker and PTA SBA  OBJECTIVE IMPAIRMENTS: Abnormal gait, decreased activity tolerance, decreased balance, decreased mobility, difficulty walking, decreased strength, decreased safety awareness, impaired perceived functional ability, impaired flexibility, postural dysfunction, and pain.    ACTIVITY LIMITATIONS: carrying, lifting, sitting, standing, stairs, transfers, hygiene/grooming, and locomotion level   PARTICIPATION LIMITATIONS: meal prep, cleaning, laundry, driving, shopping, and community activity   PERSONAL FACTORS: Age, Past/current experiences, and 1-2 comorbidities: CVA, falls, Rt hip fracture with ORIF  are  also affecting patient's functional outcome.    REHAB POTENTIAL: Good   CLINICAL DECISION MAKING: Evolving/moderate complexity   EVALUATION COMPLEXITY: Moderate     GOALS: Goals reviewed with patient? Yes   SHORT TERM GOALS: Target date: 09/06/2022   Be independent in initial HEP Baseline: Goal status: Goal met 08/14/22   2.  Improve LE strength to perform sit to stand with moderate Lt UE support Baseline:  Goal status: Goal met 08/28/22  3.  Perform TUG in < or = to 60 seconds to reduce falls risk Baseline:  Goal status: INITIAL          LONG TERM GOALS: Target date: 10/04/2022    Be independent in advanced HEP Baseline:  Goal status: INITIAL   2.  Perform TUG in < or = to 30 seconds to reduce falls risk Baseline:  Goal status: INITIAL   3.  Improve LE  strength to perform sit to stand with min to no UE support  Baseline:  Goal status: INITIAL   4.  Ambulate with hemi walker with supervision or no guard due to improved gait Baseline:  Goal status: INITIAL   5.  Demonstrate > or = to 4-/5 to 4/5 Rt hip and knee strength to improve safety and stability  Baseline:  Goal status: INITIAL     PLAN:   PT FREQUENCY: 1-2x/weekpt requested 1x/wk due to Lt shoulder pain   PT DURATION: 8 weeks   PLANNED INTERVENTIONS: Therapeutic exercises, Therapeutic activity, Neuromuscular re-education, Balance training, Gait training, Patient/Family education, Self Care, Joint mobilization, Stair training, Dry Needling, Electrical stimulation, Cryotherapy, Moist heat, Taping, Manual therapy, and Re-evaluation   PLAN FOR NEXT SESSION: Progress TE for LE slowly as pt is prone to fatigue.   Tequila Rottmann, PTA 09/11/2022, 3:31 PM

## 2022-09-13 ENCOUNTER — Other Ambulatory Visit: Payer: Self-pay

## 2022-09-13 ENCOUNTER — Ambulatory Visit: Payer: Medicare PPO | Attending: Family Medicine | Admitting: Occupational Therapy

## 2022-09-13 DIAGNOSIS — R2681 Unsteadiness on feet: Secondary | ICD-10-CM | POA: Insufficient documentation

## 2022-09-13 DIAGNOSIS — M6281 Muscle weakness (generalized): Secondary | ICD-10-CM | POA: Insufficient documentation

## 2022-09-13 DIAGNOSIS — M79601 Pain in right arm: Secondary | ICD-10-CM | POA: Diagnosis not present

## 2022-09-13 DIAGNOSIS — I69354 Hemiplegia and hemiparesis following cerebral infarction affecting left non-dominant side: Secondary | ICD-10-CM | POA: Diagnosis not present

## 2022-09-13 DIAGNOSIS — R2689 Other abnormalities of gait and mobility: Secondary | ICD-10-CM | POA: Insufficient documentation

## 2022-09-13 DIAGNOSIS — R278 Other lack of coordination: Secondary | ICD-10-CM | POA: Diagnosis not present

## 2022-09-13 NOTE — Therapy (Signed)
OUTPATIENT OCCUPATIONAL THERAPY NEURO EVALUATION  Patient Name: Angela Buchanan MRN: [id number] DOB:[birth year], 71 y.o., female Today's Date: 09/13/2022  PCP: Glenis Smoker, MD REFERRING PROVIDER: Glenis Smoker, MD  END OF SESSION:  OT End of Session - 09/13/22 1457     Visit Number 1    Number of Visits 17    Date for OT Re-Evaluation 11/10/22    Authorization Type [employer]    OT Start Time 1856    OT Stop Time 1536    OT Time Calculation (min) 44 min             Past Medical History:  Diagnosis Date   Atypical chest pain 07/19/2021   Bigeminy    no current med.   Breast cancer ([place]) 06/2017   right   Bruises easily    Carotid bruit 11/13/2019   Dental crowns present    History of diverticulitis    Hypertension    states under control with med., has been on med. x 5 yr.   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   Past Surgical History:  Procedure Laterality Date   ABDOMINAL HYSTERECTOMY     partial   BREAST LUMPECTOMY Right 06/22/2017   Malignant   BREAST LUMPECTOMY WITH RADIOACTIVE SEED AND SENTINEL LYMPH NODE BIOPSY Right 06/22/2017   Procedure: RIGHT BREAST LUMPECTOMY WITH RADIOACTIVE SEED AND RIGHT AXILLARY DEEP SENTINEL LYMPH NODE BIOPSY WITH BLUE DYE INJECTION;  Surgeon: Fanny Skates, MD;  Location: [place];  Service: General;  Laterality: Right;   BREAST LUMPECTOMY WITH RADIOACTIVE SEED LOCALIZATION Left 06/22/2017   Procedure: LEFT BREAST LUMPECTOMY WITH RADIOACTIVE SEED LOCALIZATION;  Surgeon: Fanny Skates, MD;  Location: [place];  Service: General;  Laterality: Left;   CATARACT EXTRACTION W/ INTRAOCULAR LENS  IMPLANT, BILATERAL Bilateral     EXCISION OF BREAST BIOPSY Left 06/22/2017   benign   LOOP RECORDER INSERTION N/A 05/16/2022   Procedure: LOOP RECORDER INSERTION;  Surgeon: Vickie Epley, MD;  Location: [place] [facility];  Service: Cardiovascular;  Laterality: N/A;   PERCUTANEOUS PINNING Right 06/22/2022   Procedure: PERCUTANEOUS PINNING OF RIGHT HIP;  Surgeon: Hiram Gash, MD;  [facility];  Service: Orthopedics;  Laterality: Right;   TONSILLECTOMY     age 71   Patient Active Problem List   Diagnosis Date Noted   Malnutrition of moderate degree 06/22/2022   Closed fracture of femur, neck ([place]) 06/21/2022   Urinary frequency 06/21/2022   Hypokalemia 06/21/2022   Heart murmur 06/21/2022   Hypothyroidism 06/21/2022   Dysphagia 05/17/2022   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   CVA (cerebral vascular accident) ([place]) 05/12/2022   Osteoporosis 01/24/2022   Atypical chest pain 07/19/2021   Temporomandibular joint (TMJ) pain 07/16/2020   Referred otalgia of both ears 07/16/2020   PAC (premature atrial contraction) 05/11/2020   PVC (premature ventricular contraction) 05/11/2020   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   Carotid bruit 11/13/2019   Pure hypercholesterolemia 11/13/2019   Malignant neoplasm of upper-outer quadrant of right breast in female, estrogen receptor positive ([place]) 05/22/2017   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   Diplopia 09/17/2014   Dizziness and giddiness 09/17/2014   Essential hypertension 07/22/2014   Chest pain 07/22/2014  Family history of heart disease 07/22/2014    ONSET DATE: CVA 05/12/22  REFERRING DIAG: M79.641 (ICD-10-CM) - Pain in right hand  THERAPY DIAG:  Hemiplegia and hemiparesis following cerebral infarction affecting left non-dominant side (HCC)  Pain in right arm  Muscle weakness (generalized)  Other lack of coordination  Unsteadiness on feet  Other abnormalities of gait and mobility  Rationale for Evaluation and Treatment:  Rehabilitation  SUBJECTIVE:   SUBJECTIVE STATEMENT: Pt reports having a stroke back in Aug 2023.  She reports that she went to [facility] for rehab and then had a fall after being home ~ 1 week.  She went back to [facility] after R hip fx, s/p percutaneous pinning 06/22/22.  Pt reports that she has had a recent onset of RUE pain from hand to upper arm.  Pt with multiple concerns with increased pain and reporting decreased participation in ADLs due to pain. Pt accompanied by: self and significant other (Husband, [name])  PERTINENT HISTORY: hypertension, breast cancer, hyperlipidemia, Lt hip fracture secondary to fall 06/2022, CVA 06/2022   PRECAUTIONS: Fall, Rt breast cancer with node removal, osteoporosis  WEIGHT BEARING RESTRICTIONS: No  PAIN:  Are you having pain? Yes: NPRS scale: "off the charts"/10 Pain location: R arm Pain description: sharp Aggravating factors: movement to arm Relieving factors: repositioning  FALLS: Has patient fallen in last 6 months? Yes. Number of falls 1 fall, one week after d/c home from SNF  LIVING ENVIRONMENT: Lives with: lives with their spouse Lives in: House/apartment Stairs: Yes: External: 2 steps; on left going up Has following equipment at home: Gilford Rile - 2 wheeled, Hemi walker, Wheelchair (manual), shower chair, and Grab bars  PLOF: Independent and Independent with basic ADLs  PATIENT GOALS: to get use of my R arm again  OBJECTIVE:   HAND DOMINANCE: Right  ADLs: Transfers/ambulation related to ADLs: Mod I with hemi-walker Eating: using L hand for eating, husband is assisting with cutting up food Grooming: Mod I, using L hand UB Dressing: Mod assist LB Dressing: Max assist Toileting: Mod I Bathing: Supervision - Mod I Tub Shower transfers: Herbalist with back and Grab bars  IADLs: Light housekeeping: has someone else do laundry and cleaning Meal Prep: husband is currently doing all the cooking Community  mobility: husband currently driving Medication management: husband is dispensing meds, then pt takes from pill box Handwriting: unable  MOBILITY STATUS: Needs Assist: Mod I with hemi-walker and Hx of falls  POSTURE COMMENTS:  rounded shoulders, forward head, flexed trunk , and weight shift left  ACTIVITY TOLERANCE: Activity tolerance: limited by pain  UPPER EXTREMITY ROM:    Active ROM Right eval Left eval  Shoulder flexion 54   Shoulder abduction    Shoulder adduction    Shoulder extension    Shoulder internal rotation    Shoulder external rotation    Elbow flexion 108   Elbow extension -24   Wrist flexion    Wrist extension    Wrist ulnar deviation    Wrist radial deviation    Wrist pronation    Wrist supination    (Blank rows = not tested)  Passive ROM Right eval Left eval  Shoulder flexion 65   Shoulder abduction    Shoulder adduction    Shoulder extension    Shoulder internal rotation    Shoulder external rotation    Elbow flexion    Elbow extension -17   Wrist flexion 46   Wrist extension 27   Wrist  ulnar deviation    Wrist radial deviation    Wrist pronation    Wrist supination    (Blank rows = not tested)   UPPER EXTREMITY MMT:   not assessed due to pain with any PROM, AROM    HAND FUNCTION: Loose gross grasp   COORDINATION: Box and Blocks:  Right 2blocks, Left 45 blocks. Modified to complete with RUE.  Pt able to grasp and move across to other side of box with barrier removed.    SENSATION: Mild decrease in R  EDEMA: mild edema in wrist hand, and fingers on RUE  COGNITION: Overall cognitive status: Within functional limits for tasks assessed  VISION: Subjective report: pt reports wearing glasses more since CVA Baseline vision: Wears glasses all the time, double vision after cataract removal Visual history: cataracts  VISION ASSESSMENT: To be further assessed in functional context   OBSERVATIONS: Pt guarded with movement,  hypersensitive to touch and PROM.     TODAY'S TREATMENT:                                                                              09/13/22 Educated on elevation of RUE in sitting and when sleeping as well as attempts at increased digit extension.  OT providing demonstration of use of pillow in sitting and supine position for increased positioning for elevation to aid in edema management and for pain relief. OT discussed and educated on RUE positioning during ambulation as pt with tendency to hold RUE in flexion pattern.  Pt with difficulty with extension and arm swing with ambulation.  PATIENT EDUCATION: Education details: Educated on role and purpose of OT as well as potential interventions and goals for therapy based on initial evaluation findings. Person educated: Patient and Spouse Education method: Customer service manager Education comprehension: verbalized understanding and needs further education  HOME EXERCISE PROGRAM: TBD   GOALS: Goals reviewed with patient? No  SHORT TERM GOALS: Target date: 10/13/22  Pt and spouse will be independent with ROM and coordination HEP. Baseline: Goal status: INITIAL  2.  Pt and spouse will verbalize understanding of supine and sitting positioning to decrease edema and pain in RUE. Baseline:  Goal status: INITIAL  3.  Pt will verbalize understanding of task modifications and/or potential AE needs to increase ease, safety, and independence w/ ADLs. Baseline:  Goal status: INITIAL  4.  Pt will be able to complete UB dressing with min assist demonstrating/verbalizing understanding of hemi-dressing technique. Baseline:  Goal status: INITIAL   LONG TERM GOALS: Target date: 11/10/22  Pt will be able to complete UB dressing with Supervision assist (to include donning/fastening bra PRN) demonstrating/verbalizing understanding of hemi-dressing technique. Baseline:  Goal status: INITIAL  2.  Pt will be able to incorporate use of RUE  at non-dominant level during LB dressing, to be completed at Mod I level. Baseline:  Goal status: INITIAL  3.  Pt will demonstrate ability to pick up light weight object from table top as needed to increase engagement of RUE into ADLs and self-eeding. Baseline:  Goal status: INITIAL  4.  Pt will demonstrate improved UE functional use for ADLs as evidenced by increasing box/ blocks score by 5 blocks with RUE,  to be completed in standardized manner. Baseline: Right 2blocks, Left 45 blocks. Modified to complete with RUE.  Pt able to grasp and move across to other side of box with barrier removed.   Goal status: INITIAL  5.  Pt will report decreased pain in dominant RUE interfering with ADLs/IADLs by decreased impairment score on Quick DASH with score TBD. Baseline: TBD Goal status: INITIAL  6.  Pt will be independent with splint wear and care PRN Baseline: splint needs TBD Goal status: INITIAL  ASSESSMENT:  CLINICAL IMPRESSION: Patient is a 72 y.o. female who was seen today for occupational therapy evaluation for R arm pain s/p CVA.  Pt reports recent onset of pain impacting mobility and participation in ADLs and IADLs. Pt currently lives with spouse in a single level home with 2 steps to enter with 1 rail.  PMHx includes hypertension, breast cancer, hyperlipidemia, Lt hip fracture secondary to fall 06/2022, CVA 06/2022 . Pt will benefit from skilled occupational therapy services to address strength and coordination, ROM, pain management, altered sensation, balance, GM/FM control, cognition, safety awareness, introduction of compensatory strategies/AE prn, visual-perception, and implementation of an HEP to improve participation and safety during ADLs and IADLs.   PERFORMANCE DEFICITS: in functional skills including ADLs, IADLs, coordination, sensation, edema, tone, ROM, strength, pain, flexibility, Fine motor control, Gross motor control, balance, body mechanics, endurance, decreased knowledge of  use of DME, skin integrity, vision, and UE functional use, cognitive skills including emotional and safety awareness, and psychosocial skills including coping strategies.   IMPAIRMENTS: are limiting patient from ADLs, IADLs, and rest and sleep.   CO-MORBIDITIES: may have co-morbidities  that affects occupational performance. Patient will benefit from skilled OT to address above impairments and improve overall function.  MODIFICATION OR ASSISTANCE TO COMPLETE EVALUATION: Min-Moderate modification of tasks or assist with assess necessary to complete an evaluation.  OT OCCUPATIONAL PROFILE AND HISTORY: Detailed assessment: Review of records and additional review of physical, cognitive, psychosocial history related to current functional performance.  CLINICAL DECISION MAKING: Moderate - several treatment options, min-mod task modification necessary  REHAB POTENTIAL: Good  EVALUATION COMPLEXITY: Moderate    PLAN:  OT FREQUENCY: 2x/week  OT DURATION: 8 weeks  PLANNED INTERVENTIONS: self care/ADL training, therapeutic exercise, therapeutic activity, neuromuscular re-education, manual therapy, passive range of motion, balance training, functional mobility training, splinting, paraffin, fluidotherapy, biofeedback, compression bandaging, moist heat, cryotherapy, contrast bath, patient/family education, visual/perceptual remediation/compensation, psychosocial skills training, coping strategies training, and DME and/or AE instructions  RECOMMENDED OTHER SERVICES: NA  CONSULTED AND AGREED WITH PLAN OF CARE: Patient and family member/caregiver  PLAN FOR NEXT SESSION: Review positioning recommendations for edema management and provide handout, Begin PROM, massage, and stretching.  Assess need for splinting.   Simonne Come, OTR/L 09/13/2022, 4:37 PM

## 2022-09-14 ENCOUNTER — Ambulatory Visit
Admission: RE | Admit: 2022-09-14 | Discharge: 2022-09-14 | Disposition: A | Discharge status: Home / Self Care | Payer: Medicare PPO | Source: Ambulatory Visit | Attending: Orthopaedic Surgery | Admitting: Orthopaedic Surgery

## 2022-09-14 DIAGNOSIS — M25512 Pain in left shoulder: Secondary | ICD-10-CM | POA: Diagnosis not present

## 2022-09-14 DIAGNOSIS — S46012A Strain of muscle(s) and tendon(s) of the rotator cuff of left shoulder, initial encounter: Secondary | ICD-10-CM | POA: Diagnosis not present

## 2022-09-14 MED ORDER — IOPAMIDOL (ISOVUE-M 200) INJECTION 41%
15.0000 mL | Freq: Once | INTRAMUSCULAR | Status: AC
  Administered 2022-09-14: 15 mL via INTRA_ARTICULAR

## 2022-09-18 ENCOUNTER — Encounter: Payer: Self-pay | Admitting: Physical Therapy

## 2022-09-18 ENCOUNTER — Ambulatory Visit: Payer: Medicare PPO | Admitting: Physical Therapy

## 2022-09-18 DIAGNOSIS — I69354 Hemiplegia and hemiparesis following cerebral infarction affecting left non-dominant side: Secondary | ICD-10-CM | POA: Diagnosis not present

## 2022-09-18 DIAGNOSIS — R278 Other lack of coordination: Secondary | ICD-10-CM

## 2022-09-18 DIAGNOSIS — R2689 Other abnormalities of gait and mobility: Secondary | ICD-10-CM

## 2022-09-18 DIAGNOSIS — R2681 Unsteadiness on feet: Secondary | ICD-10-CM

## 2022-09-18 DIAGNOSIS — M79601 Pain in right arm: Secondary | ICD-10-CM | POA: Diagnosis not present

## 2022-09-18 DIAGNOSIS — C50411 Malignant neoplasm of upper-outer quadrant of right female breast: Secondary | ICD-10-CM | POA: Diagnosis not present

## 2022-09-18 DIAGNOSIS — R293 Abnormal posture: Secondary | ICD-10-CM

## 2022-09-18 DIAGNOSIS — Z170 Estrogen receptor positive status [ER+]: Secondary | ICD-10-CM | POA: Diagnosis not present

## 2022-09-18 DIAGNOSIS — M6281 Muscle weakness (generalized): Secondary | ICD-10-CM | POA: Diagnosis not present

## 2022-09-18 NOTE — Therapy (Signed)
OUTPATIENT PHYSICAL THERAPY TREATMENT NOTE   Patient Name: Angela Buchanan MRN: [id number] DOB:1951/06/10, 71 y.o., female Today's Date: 09/18/2022  PCP:  Sela Hilding, MD   REFERRING PROVIDER:  Sela Hilding, MD    END OF SESSION:   PT End of Session - 09/18/22 1443     Visit Number 6    Date for PT Re-Evaluation 10/04/22    Authorization Type [employer]- visit requested    Progress Note Due on Visit 10    PT Start Time 6761    PT Stop Time 1530    PT Time Calculation (min) 47 min    Activity Tolerance Patient tolerated treatment well    Behavior During Therapy [facility] for tasks assessed/performed                Past Medical History:  Diagnosis Date   Atypical chest pain 07/19/2021   Bigeminy    no current med.   Breast cancer ([place]) 06/2017   right   Bruises easily    Carotid bruit 11/13/2019   Dental crowns present    History of diverticulitis    Hypertension    states under control with med., has been on med. x 5 yr.   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   Past Surgical History:  Procedure Laterality Date   ABDOMINAL HYSTERECTOMY     partial   BREAST LUMPECTOMY Right 06/22/2017   Malignant   BREAST LUMPECTOMY WITH RADIOACTIVE SEED AND SENTINEL LYMPH NODE BIOPSY Right 06/22/2017   Procedure: RIGHT BREAST LUMPECTOMY WITH RADIOACTIVE SEED AND RIGHT AXILLARY DEEP SENTINEL LYMPH NODE BIOPSY WITH BLUE DYE INJECTION;  Surgeon: Fanny Skates, MD;  Location: [place];  Service: General;  Laterality: Right;   BREAST LUMPECTOMY WITH RADIOACTIVE SEED LOCALIZATION Left 06/22/2017   Procedure: LEFT BREAST LUMPECTOMY WITH RADIOACTIVE SEED LOCALIZATION;  Surgeon: Fanny Skates, MD;  Location: [place];  Service: General;  Laterality: Left;   CATARACT EXTRACTION W/ INTRAOCULAR LENS  IMPLANT, BILATERAL Bilateral    EXCISION OF BREAST BIOPSY Left 06/22/2017   benign   LOOP RECORDER INSERTION N/A 05/16/2022   Procedure: LOOP RECORDER INSERTION;  Surgeon: Vickie Epley, MD;  Location: [place] [facility];  Service: Cardiovascular;  Laterality: N/A;   PERCUTANEOUS PINNING Right 06/22/2022   Procedure: PERCUTANEOUS PINNING OF RIGHT HIP;  Surgeon: Hiram Gash, MD;  Location: [facility];  Service: Orthopedics;  Laterality: Right;   TONSILLECTOMY     age 20   Patient Active Problem List   Diagnosis Date Noted   Malnutrition of moderate degree 06/22/2022   Closed fracture of femur, neck ([employer]) 06/21/2022   Urinary frequency 06/21/2022   Hypokalemia 06/21/2022   Heart murmur 06/21/2022   Hypothyroidism 06/21/2022   Dysphagia 05/17/2022   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   CVA (cerebral vascular accident) ([place]) 05/12/2022   Osteoporosis 01/24/2022   Atypical chest pain 07/19/2021   Temporomandibular joint (TMJ) pain 07/16/2020   Referred otalgia of both ears 07/16/2020   PAC (premature atrial contraction) 05/11/2020   PVC (premature ventricular contraction) 05/11/2020   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   Carotid bruit 11/13/2019   Pure hypercholesterolemia 11/13/2019   Malignant neoplasm of upper-outer quadrant of right breast in female, estrogen receptor positive ([place])  05/22/2017   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   Diplopia 09/17/2014   Dizziness and giddiness 09/17/2014   Essential hypertension 07/22/2014   Chest pain 07/22/2014   Family history of heart disease 07/22/2014    REFERRING DIAG:  R29.6 (ICD-10-CM) - Repeated falls, closed fracture of Rt hip    THERAPY DIAG:  Hemiplegia and hemiparesis following cerebral infarction affecting left non-dominant side (HCC)  Pain in right arm  Muscle weakness (generalized)  Other lack of  coordination  Unsteadiness on feet  Other abnormalities of gait and mobility  Abnormal posture  Rationale for Evaluation and Treatment Rehabilitation  PERTINENT HISTORY: hypertension, breast cancer, hyperlipidemia, Lt hip fracture secondary to fall 06/2022, CVA 06/2022   PRECAUTIONS:  Fall, Rt breast cancer with node removal, osteoporosis    SUBJECTIVE:                                                                                                                                                                                      SUBJECTIVE STATEMENT: I wish my walking would improve faster.  PAIN:  Are you having pain? Not right now but my right hand and arm can be very sore at times.    OBJECTIVE: (objective measures completed at initial evaluation unless otherwise dated)  DIAGNOSTIC FINDINGS: Rt hip fracture and ORIF     COGNITION: Overall cognitive status: Within functional limits for tasks assessed                         SENSATION: WFL     POSTURE: rounded shoulders, forward head, flexed trunk , and weight shift left   PALPATION: NA   LOWER EXTREMITY ROM: Rt hip limited by 50%, hamstrings limited by 50% bil.    LOWER EXTREMITY MMT:   MMT Right eval Left eval  Hip flexion 3+ 4-  Hip extension      Hip abduction 3- 4  Hip adduction      Hip internal rotation      Hip external rotation      Knee flexion 3- 3+  Knee extension 3+ 4-  Ankle dorsiflexion 3+ 4-  Ankle plantarflexion      Ankle inversion      Ankle eversion       (Blank rows = not tested)   FUNCTIONAL TESTS:  5 times sit to stand: not able to due per pt request secondary to Lt shoulder pain with sit to stand.  Max LT UE pain with sit to stand  Timed up and go (TUG): 1 min, 27 seconds    GAIT: Distance walked: 25 feet  Assistive device utilized: Hemi  walker on Lt Level of assistance: SBA Comments: slow mobility, reduced trunk rotation, reduce Rt LE stance time and reduced Rt arm swing      TODAY'S TREATMENT:      09/18/22: Nustep L1 6 min with PTA present to monitor: Pt was able to do her UE for 2 min before it got too painful. LAQ 2# Bil 10x  Red band ham curls 10x Bil Seated marching: 2# 10x Bil Seated ball squeezes 10x2 Seated clamshells blue loop 2x10: issued green band for home use Sit to stand from chair: 10x Seated toe taps 10x Bil then 5x Bil  GAIT:   Hand held in LTUE: 1 lap of ortho gym  2x alternating between HHA from PTA then 113fet 2x with standard cane and CBA  09/11/22: Nustep L1 6 min with PTA present to monitor LAQ 1.5# Bil 10x  Seated marching:1.5# 10x Bil Seated ball squeezes 10x2 Seated clamshells blue loop 2x7 Sit to stand from chair: 8x Side stepping over low hurdle (2) with LTUE on the counter top. PTA with CGA for safety  GAIT:   [place] feet 2x alternating between HHA from PTA then 108ft 2x with hemiwalker and SBA  09/04/22: Nustep L1 5 min with PTA present to monitor LAQ 1# Lt 7x, 5x, RT 7x, 3x Seated marching:1# 9x LT, 6x RT Seated ball squeezes 2x7 Seated clamshells blue loop 2x7 Sit to stand from chair: 2x6   GAIT:   Hand held in LTUE: 26 feet 3x alternating between HHA from PTA and using hemi walker with focus on increasing step length and better management of th ewalker.     PATIENT EDUCATION:  Education details: Access Code: [email or web address] educated: Patient Education method: Explanation, DeMedia plannerand Handouts Education comprehension: verbalized understanding and returned demonstration   HOME EXERCISE PROGRAM: Access Code: [email or web address]: [email or web address] Date: 08/09/2022 Prepared by: KeClaiborne Billings Exercises - Seated Long Arc Quad  - 3 x daily - 7 x weekly - 2 sets - 10 reps - 5 hold - Seated March   - 3 x daily - 7 x weekly - 3 sets - 10 reps - Seated Heel Toe Raises   - 3 x daily - 7 x weekly - 2 sets - 10 reps - Seated Isometric Hip Adduction with Ball  - 3 x daily - 7 x weekly - 2  sets - 10 reps   ASSESSMENT:   CLINICAL IMPRESSION: Pt arrives asking about being able to walk faster and smoother. Pt started OT last week and is liking it very much. Pt was able to ambulate with the standard cane and CGA of PTA for 1 lap around the ortho gym 2x. Pt was also able to increase her weights to 2# today.  OBJECTIVE IMPAIRMENTS: Abnormal gait, decreased activity tolerance, decreased balance, decreased mobility, difficulty walking, decreased strength, decreased safety awareness, impaired perceived functional ability, impaired flexibility, postural dysfunction, and pain.    ACTIVITY LIMITATIONS: carrying, lifting, sitting, standing, stairs, transfers, hygiene/grooming, and locomotion level   PARTICIPATION LIMITATIONS: meal prep, cleaning, laundry, driving, shopping, and community activity   PERSONAL FACTORS: Age, Past/current experiences, and 1-2 comorbidities: CVA, falls, Rt hip fracture with ORIF  are also affecting patient's functional outcome.    REHAB POTENTIAL: Good   CLINICAL DECISION MAKING: Evolving/moderate complexity   EVALUATION COMPLEXITY: Moderate     GOALS: Goals reviewed with patient? Yes   SHORT TERM GOALS: Target date: 09/06/2022   Be independent in initial HEP Baseline: Goal  status: Goal met 08/14/22   2.  Improve LE strength to perform sit to stand with moderate Lt UE support Baseline:  Goal status: Goal met 08/28/22  3.  Perform TUG in < or = to 60 seconds to reduce falls risk Baseline:  Goal status: INITIAL          LONG TERM GOALS: Target date: 10/04/2022    Be independent in advanced HEP Baseline:  Goal status: INITIAL   2.  Perform TUG in < or = to 30 seconds to reduce falls risk Baseline:  Goal status: INITIAL   3.  Improve LE strength to perform sit to stand with min to no UE support  Baseline:  Goal status: INITIAL   4.  Ambulate with hemi walker with supervision or no guard due to improved gait Baseline:  Goal status:  INITIAL   5.  Demonstrate > or = to 4-/5 to 4/5 Rt hip and knee strength to improve safety and stability  Baseline:  Goal status: INITIAL     PLAN:   PT FREQUENCY: 1-2x/weekpt requested 1x/wk due to Lt shoulder pain   PT DURATION: 8 weeks   PLANNED INTERVENTIONS: Therapeutic exercises, Therapeutic activity, Neuromuscular re-education, Balance training, Gait training, Patient/Family education, Self Care, Joint mobilization, Stair training, Dry Needling, Electrical stimulation, Cryotherapy, Moist heat, Taping, Manual therapy, and Re-evaluation   PLAN FOR NEXT SESSION: Goal check, MMT, gait with SPC   COCHRAN,JENNIFER, PTA 09/18/2022, 3:44 PM

## 2022-09-21 ENCOUNTER — Ambulatory Visit: Payer: Medicare PPO | Admitting: Occupational Therapy

## 2022-09-21 DIAGNOSIS — I69354 Hemiplegia and hemiparesis following cerebral infarction affecting left non-dominant side: Secondary | ICD-10-CM | POA: Diagnosis not present

## 2022-09-21 DIAGNOSIS — R2681 Unsteadiness on feet: Secondary | ICD-10-CM | POA: Diagnosis not present

## 2022-09-21 DIAGNOSIS — M79601 Pain in right arm: Secondary | ICD-10-CM

## 2022-09-21 DIAGNOSIS — R278 Other lack of coordination: Secondary | ICD-10-CM

## 2022-09-21 DIAGNOSIS — R2689 Other abnormalities of gait and mobility: Secondary | ICD-10-CM | POA: Diagnosis not present

## 2022-09-21 DIAGNOSIS — M6281 Muscle weakness (generalized): Secondary | ICD-10-CM

## 2022-09-21 NOTE — Therapy (Signed)
OUTPATIENT OCCUPATIONAL THERAPY NEURO EVALUATION  Patient Name: Angela Buchanan MRN: [id number] DOB:[birth year], 71 y.o., female Today's Date: 09/21/2022  PCP: Glenis Smoker, MD REFERRING PROVIDER: Glenis Smoker, MD  END OF SESSION:  OT End of Session - 09/21/22 1537     Visit Number 2    Number of Visits 17    Date for OT Re-Evaluation 11/10/22    Authorization Type [employer]    OT Start Time 1402    OT Stop Time 1446    OT Time Calculation (min) 44 min              Past Medical History:  Diagnosis Date   Atypical chest pain 07/19/2021   Bigeminy    no current med.   Breast cancer ([place]) 06/2017   right   Bruises easily    Carotid bruit 11/13/2019   Dental crowns present    History of diverticulitis    Hypertension    states under control with med., has been on med. x 5 yr.   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   Past Surgical History:  Procedure Laterality Date   ABDOMINAL HYSTERECTOMY     partial   BREAST LUMPECTOMY Right 06/22/2017   Malignant   BREAST LUMPECTOMY WITH RADIOACTIVE SEED AND SENTINEL LYMPH NODE BIOPSY Right 06/22/2017   Procedure: RIGHT BREAST LUMPECTOMY WITH RADIOACTIVE SEED AND RIGHT AXILLARY DEEP SENTINEL LYMPH NODE BIOPSY WITH BLUE DYE INJECTION;  Surgeon: Fanny Skates, MD;  Location: [place];  Service: General;  Laterality: Right;   BREAST LUMPECTOMY WITH RADIOACTIVE SEED LOCALIZATION Left 06/22/2017   Procedure: LEFT BREAST LUMPECTOMY WITH RADIOACTIVE SEED LOCALIZATION;  Surgeon: Fanny Skates, MD;  Location: [place];  Service: General;  Laterality: Left;   CATARACT EXTRACTION W/ INTRAOCULAR LENS  IMPLANT, BILATERAL Bilateral     EXCISION OF BREAST BIOPSY Left 06/22/2017   benign   LOOP RECORDER INSERTION N/A 05/16/2022   Procedure: LOOP RECORDER INSERTION;  Surgeon: Vickie Epley, MD;  Location: [place] [facility];  Service: Cardiovascular;  Laterality: N/A;   PERCUTANEOUS PINNING Right 06/22/2022   Procedure: PERCUTANEOUS PINNING OF RIGHT HIP;  Surgeon: Hiram Gash, MD;  [facility];  Service: Orthopedics;  Laterality: Right;   TONSILLECTOMY     age 1   Patient Active Problem List   Diagnosis Date Noted   Malnutrition of moderate degree 06/22/2022   Closed fracture of femur, neck (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   Carotid bruit 11/13/2019   Pure hypercholesterolemia 11/13/2019   Malignant neoplasm of upper-outer quadrant of right breast in female, estrogen receptor positive ([place]) 05/22/2017   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   Diplopia 09/17/2014   Dizziness and giddiness 09/17/2014   Essential hypertension 07/22/2014   Chest pain 07/22/2014  Family history of heart disease 07/22/2014    ONSET DATE: CVA 05/12/22  REFERRING DIAG: V74.827 (ICD-10-CM) - Pain in right hand  THERAPY DIAG:  Hemiplegia and hemiparesis following cerebral infarction affecting left non-dominant side (HCC)  Pain in right arm  Muscle weakness (generalized)  Other lack of coordination  Rationale for Evaluation and Treatment: Rehabilitation  SUBJECTIVE:   SUBJECTIVE STATEMENT: Pt reports attempting to be more  conscientious about her arm placement during mobility. Pt accompanied by: self and significant other (Husband, [name])  PERTINENT HISTORY: hypertension, breast cancer, hyperlipidemia, Lt hip fracture secondary to fall 06/2022, CVA 06/2022   PRECAUTIONS: Fall, Rt breast cancer with node removal, osteoporosis  WEIGHT BEARING RESTRICTIONS: No  PAIN:  Are you having pain? Yes: NPRS scale: 5/10 Pain location: R shoulder and upper arm Pain description: aching Aggravating factors: movement to arm Relieving factors: repositioning  FALLS: Has patient fallen in last 6 months? Yes. Number of falls 1 fall, one week after d/c home from SNF  LIVING ENVIRONMENT: Lives with: lives with their spouse Lives in: House/apartment Stairs: Yes: External: 2 steps; on left going up Has following equipment at home: Gilford Rile - 2 wheeled, Hemi walker, Wheelchair (manual), shower chair, and Grab bars  PLOF: Independent and Independent with basic ADLs  PATIENT GOALS: to get use of my R arm again  OBJECTIVE:   HAND DOMINANCE: Right  ADLs: Transfers/ambulation related to ADLs: Mod I with hemi-walker Eating: using L hand for eating, husband is assisting with cutting up food Grooming: Mod I, using L hand UB Dressing: Mod assist LB Dressing: Max assist Toileting: Mod I Bathing: Supervision - Mod I Tub Shower transfers: Herbalist with back and Grab bars  UPPER EXTREMITY ROM:    Active ROM Right eval Left eval  Shoulder flexion 54   Shoulder abduction    Shoulder adduction    Shoulder extension    Shoulder internal rotation    Shoulder external rotation    Elbow flexion 108   Elbow extension -24   Wrist flexion    Wrist extension    Wrist ulnar deviation    Wrist radial deviation    Wrist pronation    Wrist supination    (Blank rows = not tested)  Passive ROM Right eval Left eval  Shoulder flexion 65   Shoulder abduction    Shoulder adduction    Shoulder extension     Shoulder internal rotation    Shoulder external rotation    Elbow flexion    Elbow extension -17   Wrist flexion 46   Wrist extension 27   Wrist ulnar deviation    Wrist radial deviation    Wrist pronation    Wrist supination    (Blank rows = not tested)   UPPER EXTREMITY MMT:   not assessed due to pain with any PROM, AROM    HAND FUNCTION: Loose gross grasp   COORDINATION: Box and Blocks:  Right 2blocks, Left 45 blocks. Modified to complete with RUE.  Pt able to grasp and move across to other side of box with barrier removed.    SENSATION: Mild decrease in R  EDEMA: mild edema in wrist hand, and fingers on RUE  OBSERVATIONS: Pt guarded with movement, hypersensitive to touch and PROM.     TODAY'S TREATMENT:  09/21/22 Edema management: reviewed positioning when sleeping.  Encouraging placement of pillow under UE to allow for elevation to promote blood flow.  OT providing tactile cues and handout for positioning at home. Supine exercise: OT directed pt in supine exercises with snow angel for shoulder abduction.  Closed chain for AAROM with dowel, completing chest press and shoulder flexion with dowel to facilitate increased symmetry.  OT providing intermittent cues for attention to quality of movement.  Horizontal abduction with dowel with focus on opening up of shoulder ROM.  Pt reports mild pain with movements, but able to increase ROM with minimal increase in pain. UE exercise: Forearm supination/pronation with PROM to facilitate increased stretch/ROM.  Pt unable to complete full ROM into supination or pronation, therefore OT instructing in hand placement for increased stretch to facilitate increased ROM.  OT modified position to placing UE on pillow to accommodate for flexed fingers.  Finger extension with use of L hand to slowly extend at each finger joint into modified prayer position.  Pt continues to  demonstrate decreased DIP extension. QuickDash: 77% impairment    09/13/22 Educated on elevation of RUE in sitting and when sleeping as well as attempts at increased digit extension.  OT providing demonstration of use of pillow in sitting and supine position for increased positioning for elevation to aid in edema management and for pain relief. OT discussed and educated on RUE positioning during ambulation as pt with tendency to hold RUE in flexion pattern.  Pt with difficulty with extension and arm swing with ambulation.  PATIENT EDUCATION: Education details: educated on shoulder, forearm, and finger exercises Person educated: Patient and Spouse Education method: Explanation, Demonstration, Tactile cues, Verbal cues, and Handouts Education comprehension: verbalized understanding and needs further education  HOME EXERCISE PROGRAM: Access Code: [email or web address] URL: [email or web address] Date: 09/21/2022 Prepared by: [facility]  Exercises - Supine Shoulder Flexion AAROM with Dowel  - 1 x daily - 3 x weekly - 2 sets - 10 reps - Supine Shoulder Protraction with Dowel  - 1 x daily - 3 x weekly - 2 sets - 10 reps - Supine Shoulder Abduction AAROM with Dowel  - 1 x daily - 3 x weekly - 2 sets - 10 reps - Seated Forearm Pronation Supination AROM  - 1 x daily - 3 x weekly - 2 sets - 10 reps - Hand PROM Finger Extension  - 1 x daily - 3 x weekly - 2 sets - 10 reps   GOALS: Goals reviewed with patient? Yes  SHORT TERM GOALS: Target date: 10/13/22  Pt and spouse will be independent with ROM and coordination HEP. Baseline: Goal status: IN PROGRESS  2.  Pt and spouse will verbalize understanding of supine and sitting positioning to decrease edema and pain in RUE. Baseline:  Goal status: IN PROGRESS  3.  Pt will verbalize understanding of task modifications and/or potential AE needs to increase ease, safety, and independence w/ ADLs. Baseline:   Goal status: IN PROGRESS  4.  Pt will be able to complete UB dressing with min assist demonstrating/verbalizing understanding of hemi-dressing technique. Baseline:  Goal status: IN PROGRESS   LONG TERM GOALS: Target date: 11/10/22  Pt will be able to complete UB dressing with Supervision assist (to include donning/fastening bra PRN) demonstrating/verbalizing understanding of hemi-dressing technique. Baseline:  Goal status: IN PROGRESS  2.  Pt will be able to incorporate use of RUE at non-dominant level during LB dressing, to be completed  at Mod I level. Baseline:  Goal status: IN PROGRESS  3.  Pt will demonstrate ability to pick up light weight object from table top as needed to increase engagement of RUE into ADLs and self-eeding. Baseline:  Goal status: IN PROGRESS  4.  Pt will demonstrate improved UE functional use for ADLs as evidenced by increasing box/ blocks score by 5 blocks with RUE, to be completed in standardized manner. Baseline: Right 2blocks, Left 45 blocks. Modified to complete with RUE.  Pt able to grasp and move across to other side of box with barrier removed.   Goal status: IN PROGRESS  5.  Pt will report decreased pain in dominant RUE interfering with ADLs/IADLs by decreased impairment score on Quick DASH. Baseline: 77% Goal status: IN PROGRESS  6.  Pt will be independent with splint wear and care PRN Baseline: splint needs TBD Goal status: IN PROGRESS  ASSESSMENT:  CLINICAL IMPRESSION: Pt demonstrating carryover of education from evaluation in regards to placement of RUE during mobility.  Pt continues to be fearful of movements and guarded during exercises due to pain, requiring increased time and cues of encouragement to attempt tasks initially.    PERFORMANCE DEFICITS: in functional skills including ADLs, IADLs, coordination, sensation, edema, tone, ROM, strength, pain, flexibility, Fine motor control, Gross motor control, balance, body mechanics,  endurance, decreased knowledge of use of DME, skin integrity, vision, and UE functional use, cognitive skills including emotional and safety awareness, and psychosocial skills including coping strategies.   IMPAIRMENTS: are limiting patient from ADLs, IADLs, and rest and sleep.   CO-MORBIDITIES: may have co-morbidities  that affects occupational performance. Patient will benefit from skilled OT to address above impairments and improve overall function.  MODIFICATION OR ASSISTANCE TO COMPLETE EVALUATION: Min-Moderate modification of tasks or assist with assess necessary to complete an evaluation.  OT OCCUPATIONAL PROFILE AND HISTORY: Detailed assessment: Review of records and additional review of physical, cognitive, psychosocial history related to current functional performance.  CLINICAL DECISION MAKING: Moderate - several treatment options, min-mod task modification necessary  REHAB POTENTIAL: Good  EVALUATION COMPLEXITY: Moderate    PLAN:  OT FREQUENCY: 2x/week  OT DURATION: 8 weeks  PLANNED INTERVENTIONS: self care/ADL training, therapeutic exercise, therapeutic activity, neuromuscular re-education, manual therapy, passive range of motion, balance training, functional mobility training, splinting, paraffin, fluidotherapy, biofeedback, compression bandaging, moist heat, cryotherapy, contrast bath, patient/family education, visual/perceptual remediation/compensation, psychosocial skills training, coping strategies training, and DME and/or AE instructions  RECOMMENDED OTHER SERVICES: NA  CONSULTED AND AGREED WITH PLAN OF CARE: Patient and family member/caregiver  PLAN FOR NEXT SESSION: PROM, massage, and stretching.  Assess need for splinting.   Simonne Come, OTR/L 09/21/2022, 3:37 PM

## 2022-09-22 ENCOUNTER — Ambulatory Visit: Payer: Medicare PPO | Admitting: Occupational Therapy

## 2022-09-22 DIAGNOSIS — M6281 Muscle weakness (generalized): Secondary | ICD-10-CM

## 2022-09-22 DIAGNOSIS — I69354 Hemiplegia and hemiparesis following cerebral infarction affecting left non-dominant side: Secondary | ICD-10-CM

## 2022-09-22 DIAGNOSIS — R278 Other lack of coordination: Secondary | ICD-10-CM

## 2022-09-22 DIAGNOSIS — R2689 Other abnormalities of gait and mobility: Secondary | ICD-10-CM | POA: Diagnosis not present

## 2022-09-22 DIAGNOSIS — R2681 Unsteadiness on feet: Secondary | ICD-10-CM | POA: Diagnosis not present

## 2022-09-22 DIAGNOSIS — M79601 Pain in right arm: Secondary | ICD-10-CM

## 2022-09-22 NOTE — Therapy (Signed)
OUTPATIENT OCCUPATIONAL THERAPY  Treatment Session  Patient Name: Angela Buchanan MRN: [id number] DOB:10-01-1951, 71 y.o., female Today's Date: 09/22/2022  PCP: Glenis Smoker, MD REFERRING PROVIDER: Glenis Smoker, MD  END OF SESSION:  OT End of Session - 09/22/22 1111     Visit Number 3    Number of Visits 17    Date for OT Re-Evaluation 11/10/22    Authorization Type [employer]    OT Start Time 1103    OT Stop Time 1145    OT Time Calculation (min) 42 min               Past Medical History:  Diagnosis Date   Atypical chest pain 07/19/2021   Bigeminy    no current med.   Breast cancer ([place]) 06/2017   right   Bruises easily    Carotid bruit 11/13/2019   Dental crowns present    History of diverticulitis    Hypertension    states under control with med., has been on med. x 5 yr.   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   Past Surgical History:  Procedure Laterality Date   ABDOMINAL HYSTERECTOMY     partial   BREAST LUMPECTOMY Right 06/22/2017   Malignant   BREAST LUMPECTOMY WITH RADIOACTIVE SEED AND SENTINEL LYMPH NODE BIOPSY Right 06/22/2017   Procedure: RIGHT BREAST LUMPECTOMY WITH RADIOACTIVE SEED AND RIGHT AXILLARY DEEP SENTINEL LYMPH NODE BIOPSY WITH BLUE DYE INJECTION;  Surgeon: Fanny Skates, MD;  Location: [facility];  Service: General;  Laterality: Right;   BREAST LUMPECTOMY WITH RADIOACTIVE SEED LOCALIZATION Left 06/22/2017   Procedure: LEFT BREAST LUMPECTOMY WITH RADIOACTIVE SEED LOCALIZATION;  Surgeon: Fanny Skates, MD;  Location: [facility];  Service: General;  Laterality: Left;   CATARACT EXTRACTION W/ INTRAOCULAR LENS  IMPLANT, BILATERAL Bilateral     EXCISION OF BREAST BIOPSY Left 06/22/2017   benign   LOOP RECORDER INSERTION N/A 05/16/2022   Procedure: LOOP RECORDER INSERTION;  Surgeon: Vickie Epley, MD;  Location: [facility];  Service: Cardiovascular;  Laterality: N/A;   PERCUTANEOUS PINNING Right 06/22/2022   Procedure: PERCUTANEOUS PINNING OF RIGHT HIP;  Surgeon: Hiram Gash, MD;  Location: [facility];  Service: Orthopedics;  Laterality: Right;   TONSILLECTOMY     age 16   Patient Active Problem List   Diagnosis Date Noted   Malnutrition of moderate degree 06/22/2022   Closed fracture of femur, neck ([place]) 06/21/2022   Urinary frequency 06/21/2022   Hypokalemia 06/21/2022   Heart murmur 06/21/2022   Hypothyroidism 06/21/2022   Dysphagia 05/17/2022   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   CVA (cerebral vascular accident) ([place]) 05/12/2022   Osteoporosis 01/24/2022   Atypical chest pain 07/19/2021   Temporomandibular joint (TMJ) pain 07/16/2020   Referred otalgia of both ears 07/16/2020   PAC (premature atrial contraction) 05/11/2020   PVC (premature ventricular contraction) 05/11/2020   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   Carotid bruit 11/13/2019   Pure hypercholesterolemia 11/13/2019   Malignant neoplasm of upper-outer quadrant of right breast in female, estrogen receptor positive ([place]) 05/22/2017   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 Pt  accompanied by: self and significant other (Husband, [name])  PERTINENT HISTORY: hypertension, breast cancer, hyperlipidemia, Lt hip fracture secondary to fall 06/2022, CVA 06/2022   PRECAUTIONS: Fall, Rt breast cancer with node removal, osteoporosis  WEIGHT BEARING RESTRICTIONS: No  PAIN:  Are you having pain? Yes: NPRS scale: 5/10 Pain location: R shoulder and upper arm Pain description: aching Aggravating factors: movement to arm Relieving factors: repositioning  FALLS: Has patient fallen in last 6 months? Yes. Number of falls 1 fall, one week after d/c home from SNF  LIVING ENVIRONMENT: Lives with: lives with their spouse Lives in: House/apartment Stairs: Yes: External: 2 steps; on left going up Has following equipment at home: Gilford Rile - 2 wheeled, Hemi walker, Wheelchair (manual), shower chair, and Grab bars  PLOF: Independent and Independent with basic ADLs  PATIENT GOALS: to get use of my R arm again  OBJECTIVE:   HAND DOMINANCE: Right  ADLs: Transfers/ambulation related to ADLs: Mod I with hemi-walker Eating: using L hand for eating, husband is assisting with cutting up food Grooming: Mod I, using L hand UB Dressing: Mod assist LB Dressing: Max assist Toileting: Mod I Bathing: Supervision - Mod I Tub Shower transfers: Herbalist with back and Grab bars  UPPER EXTREMITY ROM:    Active ROM Right eval Left eval  Shoulder flexion 54   Shoulder abduction    Shoulder adduction    Shoulder extension    Shoulder internal rotation    Shoulder external rotation    Elbow flexion 108   Elbow extension -24   Wrist flexion    Wrist extension    Wrist ulnar deviation    Wrist radial deviation    Wrist pronation    Wrist supination    (Blank rows = not tested)  Passive ROM Right eval Left eval  Shoulder flexion 65   Shoulder abduction    Shoulder adduction    Shoulder extension    Shoulder internal rotation    Shoulder external rotation     Elbow flexion    Elbow extension -17   Wrist flexion 46   Wrist extension 27   Wrist ulnar deviation    Wrist radial deviation    Wrist pronation    Wrist supination    (Blank rows = not tested)   UPPER EXTREMITY MMT:   not assessed due to pain with any PROM, AROM    HAND FUNCTION: Loose gross grasp   COORDINATION: Box and Blocks:  Right 2blocks, Left 45 blocks. Modified to complete with RUE.  Pt able to grasp and move across to other side of box with barrier removed.    SENSATION: Mild decrease in R  EDEMA: mild edema in wrist hand, and fingers on RUE  OBSERVATIONS: Pt guarded with movement, hypersensitive to touch and PROM.     TODAY'S TREATMENT:  09/22/22 UE Ranger: engaged in shoulder flexion/extension and horizontal abduction/adduction with focus on motor control. Pt demonstrating mild perturbations during shoulder flexion/extension, therefore OT providing verbal cues to visually attend to RUE to increase motor control.   UE exercise: with focus on finger extension - reviewing exercises and instructions from previous session, thumb opposition with attempts at AROM and then with blocking at other fingers to allow for increased movement.  Pt demonstrating inability to oppose thumb to 4th of 5th digits, but with blocking able to touch to side of ring finger.   Therapeutic activity: engaged in picking up large grip pegs with R hand.  OT encouraging pt to open hand as much as possible to grasp pegs vs scooping hand around pegs.  Pt demonstrating decreased gross motor control, knocking pegs over when returning to grasp next peg.  OT providing cues to visually attend to RUE during all movements due to decreased proprioception.  Increased challenge to picking up with pincer grasp to challenge sustained grasp and index finger isolation.  Due to ability to complete with min difficulty, further challenged to  picking up with pincer grasp and then pronating 90* to release pegs.  09/21/22 Edema management: reviewed positioning when sleeping.  Encouraging placement of pillow under UE to allow for elevation to promote blood flow.  OT providing tactile cues and handout for positioning at home. Supine exercise: OT directed pt in supine exercises with snow angel for shoulder abduction.  Closed chain for AAROM with dowel, completing chest press and shoulder flexion with dowel to facilitate increased symmetry.  OT providing intermittent cues for attention to quality of movement.  Horizontal abduction with dowel with focus on opening up of shoulder ROM.  Pt reports mild pain with movements, but able to increase ROM with minimal increase in pain. UE exercise: Forearm supination/pronation with PROM to facilitate increased stretch/ROM.  Pt unable to complete full ROM into supination or pronation, therefore OT instructing in hand placement for increased stretch to facilitate increased ROM.  OT modified position to placing UE on pillow to accommodate for flexed fingers.  Finger extension with use of L hand to slowly extend at each finger joint into modified prayer position.  Pt continues to demonstrate decreased DIP extension. QuickDash: 77% impairment    09/13/22 Educated on elevation of RUE in sitting and when sleeping as well as attempts at increased digit extension.  OT providing demonstration of use of pillow in sitting and supine position for increased positioning for elevation to aid in edema management and for pain relief. OT discussed and educated on RUE positioning during ambulation as pt with tendency to hold RUE in flexion pattern.  Pt with difficulty with extension and arm swing with ambulation.  PATIENT EDUCATION: Education details: educated on shoulder, forearm, and finger exercises Person educated: Patient and Spouse Education method: Explanation, Demonstration, Tactile cues, Verbal cues, and  Handouts Education comprehension: verbalized understanding and needs further education  HOME EXERCISE PROGRAM: Access Code: [email or web address] URL: [email or web address] Date: 09/22/2022 Prepared by: [facility]  Exercises - Supine Shoulder Flexion AAROM with Dowel  - 1 x daily - 3 x weekly - 2 sets - 10 reps - Supine Shoulder Protraction with Dowel  - 1 x daily - 3 x weekly - 2 sets - 10 reps - Supine Shoulder Abduction AAROM with Dowel  - 1 x daily - 3 x weekly - 2 sets - 10 reps - Seated Forearm Pronation Supination AROM  - 1  x daily - 7 x weekly - 2 sets - 10 reps - Hand PROM Finger Extension  - 1 x daily - 7 x weekly - 2 sets - 10 reps - Thumb AROM Opposition  - 1 x daily - 7 x weekly - 1 sets - 10 reps - Seated Isolated Finger PIP Flexion AROM  - 1 x daily - 7 x weekly - 1 sets - 10 reps   GOALS: Goals reviewed with patient? Yes  SHORT TERM GOALS: Target date: 10/13/22  Pt and spouse will be independent with ROM and coordination HEP. Baseline: Goal status: IN PROGRESS  2.  Pt and spouse will verbalize understanding of supine and sitting positioning to decrease edema and pain in RUE. Baseline:  Goal status: IN PROGRESS  3.  Pt will verbalize understanding of task modifications and/or potential AE needs to increase ease, safety, and independence w/ ADLs. Baseline:  Goal status: IN PROGRESS  4.  Pt will be able to complete UB dressing with min assist demonstrating/verbalizing understanding of hemi-dressing technique. Baseline:  Goal status: IN PROGRESS   LONG TERM GOALS: Target date: 11/10/22  Pt will be able to complete UB dressing with Supervision assist (to include donning/fastening bra PRN) demonstrating/verbalizing understanding of hemi-dressing technique. Baseline:  Goal status: IN PROGRESS  2.  Pt will be able to incorporate use of RUE at non-dominant level during LB dressing, to be completed at Mod I level. Baseline:   Goal status: IN PROGRESS  3.  Pt will demonstrate ability to pick up light weight object from table top as needed to increase engagement of RUE into ADLs and self-eeding. Baseline:  Goal status: IN PROGRESS  4.  Pt will demonstrate improved UE functional use for ADLs as evidenced by increasing box/ blocks score by 5 blocks with RUE, to be completed in standardized manner. Baseline: Right 2blocks, Left 45 blocks. Modified to complete with RUE.  Pt able to grasp and move across to other side of box with barrier removed.   Goal status: IN PROGRESS  5.  Pt will report decreased pain in dominant RUE interfering with ADLs/IADLs by decreased impairment score on Quick DASH. Baseline: 77% Goal status: IN PROGRESS  6.  Pt will be independent with splint wear and care PRN Baseline: splint needs TBD Goal status: IN PROGRESS  ASSESSMENT:  CLINICAL IMPRESSION: Pt demonstrating carryover of education from previous session with integration of supination and pronation into grasp and release activity this session.  Pt continues to demonstrate increased stiffness in fingers, impacting grasp and opening.  Pt with onset of pain with attempts at further stretching fingers to full extension.    PERFORMANCE DEFICITS: in functional skills including ADLs, IADLs, coordination, sensation, edema, tone, ROM, strength, pain, flexibility, Fine motor control, Gross motor control, balance, body mechanics, endurance, decreased knowledge of use of DME, skin integrity, vision, and UE functional use, cognitive skills including emotional and safety awareness, and psychosocial skills including coping strategies.   IMPAIRMENTS: are limiting patient from ADLs, IADLs, and rest and sleep.   CO-MORBIDITIES: may have co-morbidities  that affects occupational performance. Patient will benefit from skilled OT to address above impairments and improve overall function.  MODIFICATION OR ASSISTANCE TO COMPLETE EVALUATION: Min-Moderate  modification of tasks or assist with assess necessary to complete an evaluation.  OT OCCUPATIONAL PROFILE AND HISTORY: Detailed assessment: Review of records and additional review of physical, cognitive, psychosocial history related to current functional performance.  CLINICAL DECISION MAKING: Moderate - several treatment options, min-mod task  modification necessary  REHAB POTENTIAL: Good  EVALUATION COMPLEXITY: Moderate    PLAN:  OT FREQUENCY: 2x/week  OT DURATION: 8 weeks  PLANNED INTERVENTIONS: self care/ADL training, therapeutic exercise, therapeutic activity, neuromuscular re-education, manual therapy, passive range of motion, balance training, functional mobility training, splinting, paraffin, fluidotherapy, biofeedback, compression bandaging, moist heat, cryotherapy, contrast bath, patient/family education, visual/perceptual remediation/compensation, psychosocial skills training, coping strategies training, and DME and/or AE instructions  RECOMMENDED OTHER SERVICES: NA  CONSULTED AND AGREED WITH PLAN OF CARE: Patient and family member/caregiver  PLAN FOR NEXT SESSION: PROM, massage, and stretching.  Assess need for splinting.   Prabhleen Montemayor, [place], OTR/L 09/22/2022, 11:11 AM

## 2022-09-27 ENCOUNTER — Ambulatory Visit: Payer: Medicare PPO | Admitting: Occupational Therapy

## 2022-09-27 ENCOUNTER — Ambulatory Visit: Payer: Medicare PPO

## 2022-09-27 DIAGNOSIS — R2681 Unsteadiness on feet: Secondary | ICD-10-CM

## 2022-09-27 DIAGNOSIS — I69354 Hemiplegia and hemiparesis following cerebral infarction affecting left non-dominant side: Secondary | ICD-10-CM | POA: Diagnosis not present

## 2022-09-27 DIAGNOSIS — M6281 Muscle weakness (generalized): Secondary | ICD-10-CM

## 2022-09-27 DIAGNOSIS — M79601 Pain in right arm: Secondary | ICD-10-CM

## 2022-09-27 DIAGNOSIS — C50411 Malignant neoplasm of upper-outer quadrant of right female breast: Secondary | ICD-10-CM | POA: Diagnosis not present

## 2022-09-27 DIAGNOSIS — R2689 Other abnormalities of gait and mobility: Secondary | ICD-10-CM | POA: Diagnosis not present

## 2022-09-27 DIAGNOSIS — Z170 Estrogen receptor positive status [ER+]: Secondary | ICD-10-CM | POA: Diagnosis not present

## 2022-09-27 DIAGNOSIS — R278 Other lack of coordination: Secondary | ICD-10-CM

## 2022-09-27 DIAGNOSIS — R293 Abnormal posture: Secondary | ICD-10-CM | POA: Diagnosis not present

## 2022-09-27 NOTE — Therapy (Signed)
OUTPATIENT OCCUPATIONAL THERAPY  Treatment Session  Patient Name: Angela Buchanan MRN: [id number] DOB:1950-12-01, 71 y.o., female Today's Date: 09/27/2022  PCP: Glenis Smoker, MD REFERRING PROVIDER: Glenis Smoker, MD  END OF SESSION:  OT End of Session - 09/27/22 1543     Visit Number 4    Number of Visits 17    Date for OT Re-Evaluation 11/10/22    Authorization Type [employer]    OT Start Time 70    OT Stop Time 1612    OT Time Calculation (min) 44 min                Past Medical History:  Diagnosis Date   Atypical chest pain 07/19/2021   Bigeminy    no current med.   Breast cancer ([place]) 06/2017   right   Bruises easily    Carotid bruit 11/13/2019   Dental crowns present    History of diverticulitis    Hypertension    states under control with med., has been on med. x 5 yr.   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   Past Surgical History:  Procedure Laterality Date   ABDOMINAL HYSTERECTOMY     partial   BREAST LUMPECTOMY Right 06/22/2017   Malignant   BREAST LUMPECTOMY WITH RADIOACTIVE SEED AND SENTINEL LYMPH NODE BIOPSY Right 06/22/2017   Procedure: RIGHT BREAST LUMPECTOMY WITH RADIOACTIVE SEED AND RIGHT AXILLARY DEEP SENTINEL LYMPH NODE BIOPSY WITH BLUE DYE INJECTION;  Surgeon: Fanny Skates, MD;  Location: [facility];  Service: General;  Laterality: Right;   BREAST LUMPECTOMY WITH RADIOACTIVE SEED LOCALIZATION Left 06/22/2017   Procedure: LEFT BREAST LUMPECTOMY WITH RADIOACTIVE SEED LOCALIZATION;  Surgeon: Fanny Skates, MD;  Location: [facility];  Service: General;  Laterality: Left;   CATARACT EXTRACTION W/ INTRAOCULAR LENS  IMPLANT, BILATERAL Bilateral     EXCISION OF BREAST BIOPSY Left 06/22/2017   benign   LOOP RECORDER INSERTION N/A 05/16/2022   Procedure: LOOP RECORDER INSERTION;  Surgeon: Vickie Epley, MD;  Location: [facility];  Service: Cardiovascular;  Laterality: N/A;   PERCUTANEOUS PINNING Right 06/22/2022   Procedure: PERCUTANEOUS PINNING OF RIGHT HIP;  Surgeon: Hiram Gash, MD;  Location: [facility];  Service: Orthopedics;  Laterality: Right;   TONSILLECTOMY     age 34   Patient Active Problem List   Diagnosis Date Noted   Malnutrition of moderate degree 06/22/2022   Closed fracture of femur, neck ([place]) 06/21/2022   Urinary frequency 06/21/2022   Hypokalemia 06/21/2022   Heart murmur 06/21/2022   Hypothyroidism 06/21/2022   Dysphagia 05/17/2022   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   CVA (cerebral vascular accident) ([place]) 05/12/2022   Osteoporosis 01/24/2022   Atypical chest pain 07/19/2021   Temporomandibular joint (TMJ) pain 07/16/2020   Referred otalgia of both ears 07/16/2020   PAC (premature atrial contraction) 05/11/2020   PVC (premature ventricular contraction) 05/11/2020   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   Carotid bruit 11/13/2019   Pure hypercholesterolemia 11/13/2019   Malignant neoplasm of upper-outer quadrant of right breast in female, estrogen receptor positive ([place]) 05/22/2017   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 Pt accompanied by: self and  significant other (Husband, [name])  PERTINENT HISTORY: hypertension, breast cancer, hyperlipidemia, Lt hip fracture secondary to fall 06/2022, CVA 06/2022   PRECAUTIONS: Fall, Rt breast cancer with node removal, osteoporosis  WEIGHT BEARING RESTRICTIONS: No  PAIN:  Are you having pain? Yes: NPRS scale: 5/10 Pain location: R shoulder and upper arm Pain description: aching Aggravating factors: movement to arm Relieving factors: repositioning  FALLS: Has patient fallen in last 6 months? Yes. Number of falls 1 fall, one week after d/c home from SNF  LIVING ENVIRONMENT: Lives with: lives with their spouse Lives in: House/apartment Stairs: Yes: External: 2 steps; on left going up Has following equipment at home: Gilford Rile - 2 wheeled, Hemi walker, Wheelchair (manual), shower chair, and Grab bars  PLOF: Independent and Independent with basic ADLs  PATIENT GOALS: to get use of my R arm again  OBJECTIVE:   HAND DOMINANCE: Right  ADLs: Transfers/ambulation related to ADLs: Mod I with hemi-walker Eating: using L hand for eating, husband is assisting with cutting up food Grooming: Mod I, using L hand UB Dressing: Mod assist LB Dressing: Max assist Toileting: Mod I Bathing: Supervision - Mod I Tub Shower transfers: Herbalist with back and Grab bars  UPPER EXTREMITY ROM:    Active ROM Right eval Left eval  Shoulder flexion 54   Shoulder abduction    Shoulder adduction    Shoulder extension    Shoulder internal rotation    Shoulder external rotation    Elbow flexion 108   Elbow extension -24   Wrist flexion    Wrist extension    Wrist ulnar deviation    Wrist radial deviation    Wrist pronation    Wrist supination    (Blank rows = not tested)  Passive ROM Right eval Left eval  Shoulder flexion 65   Shoulder abduction    Shoulder adduction    Shoulder extension    Shoulder internal rotation    Shoulder external rotation    Elbow flexion     Elbow extension -17   Wrist flexion 46   Wrist extension 27   Wrist ulnar deviation    Wrist radial deviation    Wrist pronation    Wrist supination    (Blank rows = not tested)   UPPER EXTREMITY MMT:   not assessed due to pain with any PROM, AROM    HAND FUNCTION: Loose gross grasp   COORDINATION: Box and Blocks:  Right 2blocks, Left 45 blocks. Modified to complete with RUE.  Pt able to grasp and move across to other side of box with barrier removed.    SENSATION: Mild decrease in R  EDEMA: mild edema in wrist hand, and fingers on RUE  OBSERVATIONS: Pt guarded with movement, hypersensitive to touch and PROM.     TODAY'S TREATMENT:  09/27/22 Jacket: pt initially asking spouse to take off her jacket.  OT provided cues for technique, pt with increased time but able to complete without physical assistance.   PROM/AAROM: OT answered pt specific questions about previous exercises.  OT providing initial demonstration, hand over hand, fading to min cues for technique with forearm pronation/supination as well as isolated finger flexion to facilitate thumb opposition.   PROM: OT instructed pt in composite finger flexion/extension with use of LUE to facilitate full ROM.  Pt tolerating improved ROM with flexion, nearly touching palm.  However continues to complain of pain with finger extension. Modalities: discussed use of heat prior to exercises/stretches while educating on impact of sensation impairments with use of heat.  Pt sensation appears intact. Grasp/release: attempted to focus on finger flexion/extension to grasp/release cones.  Pt demonstrating min index finger extension allowing for opening of hand, but no extension in any other fingers.  OT providing cues to attempt grasp/release prior to sliding hand up/down cone.   09/22/22 UE Ranger: engaged in shoulder flexion/extension and horizontal  abduction/adduction with focus on motor control. Pt demonstrating mild perturbations during shoulder flexion/extension, therefore OT providing verbal cues to visually attend to RUE to increase motor control.   UE exercise: with focus on finger extension - reviewing exercises and instructions from previous session, thumb opposition with attempts at AROM and then with blocking at other fingers to allow for increased movement.  Pt demonstrating inability to oppose thumb to 4th of 5th digits, but with blocking able to touch to side of ring finger.   Therapeutic activity: engaged in picking up large grip pegs with R hand.  OT encouraging pt to open hand as much as possible to grasp pegs vs scooping hand around pegs.  Pt demonstrating decreased gross motor control, knocking pegs over when returning to grasp next peg.  OT providing cues to visually attend to RUE during all movements due to decreased proprioception.  Increased challenge to picking up with pincer grasp to challenge sustained grasp and index finger isolation.  Due to ability to complete with min difficulty, further challenged to picking up with pincer grasp and then pronating 90* to release pegs.  09/21/22 Edema management: reviewed positioning when sleeping.  Encouraging placement of pillow under UE to allow for elevation to promote blood flow.  OT providing tactile cues and handout for positioning at home. Supine exercise: OT directed pt in supine exercises with snow angel for shoulder abduction.  Closed chain for AAROM with dowel, completing chest press and shoulder flexion with dowel to facilitate increased symmetry.  OT providing intermittent cues for attention to quality of movement.  Horizontal abduction with dowel with focus on opening up of shoulder ROM.  Pt reports mild pain with movements, but able to increase ROM with minimal increase in pain. UE exercise: Forearm supination/pronation with PROM to facilitate increased stretch/ROM.  Pt  unable to complete full ROM into supination or pronation, therefore OT instructing in hand placement for increased stretch to facilitate increased ROM.  OT modified position to placing UE on pillow to accommodate for flexed fingers.  Finger extension with use of L hand to slowly extend at each finger joint into modified prayer position.  Pt continues to demonstrate decreased DIP extension. QuickDash: 77% impairment  PATIENT EDUCATION: Education details: educated on shoulder, forearm, and finger exercises Person educated: Patient and Spouse Education method: Explanation, Demonstration, Tactile cues, Verbal cues, and Handouts Education comprehension: verbalized understanding and needs further education  HOME EXERCISE PROGRAM: Access  Code: [email or web address] URL: [email or web address] Date: 09/27/2022 Prepared by: [facility]  Exercises - Supine Shoulder Flexion AAROM with Dowel  - 1 x daily - 3 x weekly - 2 sets - 10 reps - Supine Shoulder Protraction with Dowel  - 1 x daily - 3 x weekly - 2 sets - 10 reps - Supine Shoulder Abduction AAROM with Dowel  - 1 x daily - 3 x weekly - 2 sets - 10 reps - Seated Forearm Pronation Supination AROM  - 1 x daily - 7 x weekly - 2 sets - 10 reps - Hand PROM Finger Extension  - 1 x daily - 7 x weekly - 2 sets - 10 reps - Thumb AROM Opposition  - 1 x daily - 7 x weekly - 1 sets - 5 reps - Seated Isolated Finger PIP Flexion AROM  - 1 x daily - 7 x weekly - 1 sets - 5 reps - Seated Finger Composite Flexion Stretch  - 1 x daily - 7 x weekly - 1 sets - 5 reps   GOALS: Goals reviewed with patient? Yes  SHORT TERM GOALS: Target date: 10/13/22  Pt and spouse will be independent with ROM and coordination HEP. Baseline: Goal status: IN PROGRESS  2.  Pt and spouse will verbalize understanding of supine and sitting positioning to decrease edema and pain in RUE. Baseline:  Goal status: IN PROGRESS  3.  Pt will  verbalize understanding of task modifications and/or potential AE needs to increase ease, safety, and independence w/ ADLs. Baseline:  Goal status: IN PROGRESS  4.  Pt will be able to complete UB dressing with min assist demonstrating/verbalizing understanding of hemi-dressing technique. Baseline:  Goal status: IN PROGRESS   LONG TERM GOALS: Target date: 11/10/22  Pt will be able to complete UB dressing with Supervision assist (to include donning/fastening bra PRN) demonstrating/verbalizing understanding of hemi-dressing technique. Baseline:  Goal status: IN PROGRESS  2.  Pt will be able to incorporate use of RUE at non-dominant level during LB dressing, to be completed at Mod I level. Baseline:  Goal status: IN PROGRESS  3.  Pt will demonstrate ability to pick up light weight object from table top as needed to increase engagement of RUE into ADLs and self-eeding. Baseline:  Goal status: IN PROGRESS  4.  Pt will demonstrate improved UE functional use for ADLs as evidenced by increasing box/ blocks score by 5 blocks with RUE, to be completed in standardized manner. Baseline: Right 2blocks, Left 45 blocks. Modified to complete with RUE.  Pt able to grasp and move across to other side of box with barrier removed.   Goal status: IN PROGRESS  5.  Pt will report decreased pain in dominant RUE interfering with ADLs/IADLs by decreased impairment score on Quick DASH. Baseline: 77% Goal status: IN PROGRESS  6.  Pt will be independent with splint wear and care PRN Baseline: splint needs TBD Goal status: IN PROGRESS  ASSESSMENT:  CLINICAL IMPRESSION: Pt demonstrating carryover of education from previous session after repetition of new exercises.  Pt tolerating increased finger flexion and extension this session. Pt continues to demonstrate increased stiffness in fingers, impacting grasp and release.  Pt with tolerating sustained min stretch, however with onset of pain with attempts at further  stretching fingers to full extension.    PERFORMANCE DEFICITS: in functional skills including ADLs, IADLs, coordination, sensation, edema, tone, ROM, strength, pain, flexibility, Fine motor control, Gross motor control, balance, body  mechanics, endurance, decreased knowledge of use of DME, skin integrity, vision, and UE functional use, cognitive skills including emotional and safety awareness, and psychosocial skills including coping strategies.   IMPAIRMENTS: are limiting patient from ADLs, IADLs, and rest and sleep.   CO-MORBIDITIES: may have co-morbidities  that affects occupational performance. Patient will benefit from skilled OT to address above impairments and improve overall function.  MODIFICATION OR ASSISTANCE TO COMPLETE EVALUATION: Min-Moderate modification of tasks or assist with assess necessary to complete an evaluation.  OT OCCUPATIONAL PROFILE AND HISTORY: Detailed assessment: Review of records and additional review of physical, cognitive, psychosocial history related to current functional performance.  CLINICAL DECISION MAKING: Moderate - several treatment options, min-mod task modification necessary  REHAB POTENTIAL: Good  EVALUATION COMPLEXITY: Moderate    PLAN:  OT FREQUENCY: 2x/week  OT DURATION: 8 weeks  PLANNED INTERVENTIONS: self care/ADL training, therapeutic exercise, therapeutic activity, neuromuscular re-education, manual therapy, passive range of motion, balance training, functional mobility training, splinting, paraffin, fluidotherapy, biofeedback, compression bandaging, moist heat, cryotherapy, contrast bath, patient/family education, visual/perceptual remediation/compensation, psychosocial skills training, coping strategies training, and DME and/or AE instructions  RECOMMENDED OTHER SERVICES: NA  CONSULTED AND AGREED WITH PLAN OF CARE: Patient and family member/caregiver  PLAN FOR NEXT SESSION: PROM, massage, and stretching.  Assess need for  splinting.   Simonne Come, OTR/L 09/27/2022, 4:20 PM

## 2022-09-27 NOTE — Therapy (Signed)
OUTPATIENT PHYSICAL THERAPY TREATMENT NOTE   Patient Name: Angela Buchanan MRN: [id number] DOB:07-16-1951, 71 y.o., female Today's Date: 09/27/2022  PCP:  Sela Hilding, MD   REFERRING PROVIDER:  Sela Hilding, MD    END OF SESSION:   PT End of Session - 09/27/22 1321     Visit Number 7    Date for PT Re-Evaluation 11/22/22    Authorization Type [employer]- visit requested    Authorization - Visit Number 7    Authorization - Number of Visits 12    Progress Note Due on Visit 10    PT Start Time 1232    PT Stop Time 1322    PT Time Calculation (min) 50 min    Activity Tolerance Patient tolerated treatment well    Behavior During Therapy [facility] for tasks assessed/performed                 Past Medical History:  Diagnosis Date   Atypical chest pain 07/19/2021   Bigeminy    no current med.   Breast cancer ([place]) 06/2017   right   Bruises easily    Carotid bruit 11/13/2019   Dental crowns present    History of diverticulitis    Hypertension    states under control with med., has been on med. x 5 yr.   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   Past Surgical History:  Procedure Laterality Date   ABDOMINAL HYSTERECTOMY     partial   BREAST LUMPECTOMY Right 06/22/2017   Malignant   BREAST LUMPECTOMY WITH RADIOACTIVE SEED AND SENTINEL LYMPH NODE BIOPSY Right 06/22/2017   Procedure: RIGHT BREAST LUMPECTOMY WITH RADIOACTIVE SEED AND RIGHT AXILLARY DEEP SENTINEL LYMPH NODE BIOPSY WITH BLUE DYE INJECTION;  Surgeon: Fanny Skates, MD;  Location: [place];  Service: General;  Laterality: Right;   BREAST LUMPECTOMY WITH RADIOACTIVE SEED LOCALIZATION Left 06/22/2017   Procedure: LEFT BREAST LUMPECTOMY WITH  RADIOACTIVE SEED LOCALIZATION;  Surgeon: Fanny Skates, MD;  Location: [place];  Service: General;  Laterality: Left;   CATARACT EXTRACTION W/ INTRAOCULAR LENS  IMPLANT, BILATERAL Bilateral    EXCISION OF BREAST BIOPSY Left 06/22/2017   benign   LOOP RECORDER INSERTION N/A 05/16/2022   Procedure: LOOP RECORDER INSERTION;  Surgeon: Vickie Epley, MD;  Location: [place] [facility];  Service: Cardiovascular;  Laterality: N/A;   PERCUTANEOUS PINNING Right 06/22/2022   Procedure: PERCUTANEOUS PINNING OF RIGHT HIP;  Surgeon: Hiram Gash, MD;  Location: [facility];  Service: Orthopedics;  Laterality: Right;   TONSILLECTOMY     age 50   Patient Active Problem List   Diagnosis Date Noted   Malnutrition of moderate degree 06/22/2022   Closed fracture of femur, neck ([place]) 06/21/2022   Urinary frequency 06/21/2022   Hypokalemia 06/21/2022   Heart murmur 06/21/2022   Hypothyroidism 06/21/2022   Dysphagia 05/17/2022   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   CVA (cerebral vascular accident) ([place]) 05/12/2022   Osteoporosis 01/24/2022   Atypical chest pain 07/19/2021   Temporomandibular joint (TMJ) pain 07/16/2020   Referred otalgia of both ears 07/16/2020   PAC (premature atrial contraction) 05/11/2020   PVC (premature ventricular contraction) 05/11/2020   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   Carotid bruit 11/13/2019   Pure  hypercholesterolemia 11/13/2019   Malignant neoplasm of upper-outer quadrant of right breast in female, estrogen receptor positive ([place]) 05/22/2017   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   Diplopia 09/17/2014   Dizziness and giddiness 09/17/2014   Essential hypertension 07/22/2014   Chest pain 07/22/2014   Family history of heart disease 07/22/2014    REFERRING DIAG:  R29.6 (ICD-10-CM) - Repeated falls, closed fracture of Rt hip    THERAPY DIAG:  Muscle weakness (generalized)  Unsteadiness on feet  Rationale for Evaluation and Treatment  Rehabilitation  PERTINENT HISTORY: hypertension, breast cancer, hyperlipidemia, Lt hip fracture secondary to fall 06/2022, CVA 06/2022   PRECAUTIONS:  Fall, Rt breast cancer with node removal, osteoporosis    SUBJECTIVE:                                                                                                                                                                                      SUBJECTIVE STATEMENT: I am moving better. I want to get rid of the hemiwalker.  PAIN:  Are you having pain? Not right now but my right hand and arm can be very sore at times.    OBJECTIVE: (objective measures completed at initial evaluation unless otherwise dated)  DIAGNOSTIC FINDINGS: Rt hip fracture and ORIF     COGNITION: Overall cognitive status: Within functional limits for tasks assessed                         SENSATION: WFL     POSTURE: rounded shoulders, forward head, flexed trunk , and weight shift left   PALPATION: NA   LOWER EXTREMITY ROM: Rt hip limited by 50%, hamstrings limited by 50% bil.    LOWER EXTREMITY MMT:   MMT Right eval Right  09/27/22 Left eval  Hip flexion 3+ 4- 4-  Hip extension       Hip abduction 3-  4  Hip adduction       Hip internal rotation       Hip external rotation       Knee flexion 3- 4- 3+  Knee extension 3+ 4 4-  Ankle dorsiflexion 3+ 4- 4-  Ankle plantarflexion       Ankle inversion       Ankle eversion        (Blank rows = not tested)   FUNCTIONAL TESTS:  5 times sit to stand: not able to due per pt request secondary to Lt shoulder pain with sit to stand.  Max LT UE pain with sit to stand   Timed up and go (TUG): 1 min, 27 seconds   09/27/22: 5x sit to  stand: 29 seconds with use of Lt hand  TUG: 27 seconds    GAIT: Distance walked: 25 feet  Assistive device utilized: Hemi walker on Lt Level of assistance: SBA Comments: slow mobility, reduced trunk rotation, reduce Rt LE stance time and reduced Rt arm swing      TODAY'S TREATMENT:     09/27/22: Nustep L1 8 min with PT present to monitor and discuss progress  Standing heel raises at barre 2x10 Standing hip abduction at barre 2x10 Sit to stand with use of Lt UE 2x10 Re-evaluation complted   GAIT:   around clinic with standard cane. CGA by PT and cueing for technique  09/18/22: Nustep L1 6 min with PTA present to monitor: Pt was able to do her UE for 2 min before it got too painful. LAQ 2# Bil 10x  Red band ham curls 10x Bil Seated marching: 2# 10x Bil Seated ball squeezes 10x2 Seated clamshells blue loop 2x10: issued green band for home use Sit to stand from chair: 10x Seated toe taps 10x Bil then 5x Bil  GAIT:   Hand held in LTUE: 1 lap of ortho gym  2x alternating between HHA from PTA then 116fet 2x with standard cane and CBA  09/11/22: Nustep L1 6 min with PTA present to monitor LAQ 1.5# Bil 10x  Seated marching:1.5# 10x Bil Seated ball squeezes 10x2 Seated clamshells blue loop 2x7 Sit to stand from chair: 8x Side stepping over low hurdle (2) with LTUE on the counter top. PTA with CGA for safety  GAIT:   [place] 100 feet 2x alternating between HHA from PTA then 1051ft 2x with hemiwalker and SBA   PATIENT EDUCATION:  Education details: Access Code: [email or web address] educated: Patient Education method: Explanation, Demonstration, and Handouts Education comprehension: verbalized understanding and returned demonstration   HOME EXERCISE PROGRAM: Access Code: [email or web address]: [email or web address] Date: 09/27/2022 Prepared by: KeClaiborne BillingsExercises - Seated Long Arc Quad  - 3 x daily - 7 x weekly - 2 sets - 10 reps - 5 hold - Seated March   - 3 x daily - 7 x weekly - 3 sets - 10 reps - Seated Heel Toe Raises   - 3 x daily - 7 x weekly - 2 sets - 10 reps - Seated Isometric Hip Adduction with Ball  - 3 x daily - 7 x weekly - 2 sets - 10 reps - Sit to Stand with Armchair  - 2 x daily - 7 x weekly - 2 sets - 5-10  reps - Standing Hip Abduction with Counter Support  - 1 x daily - 7 x weekly - 2 sets - 10 reps - Heel Raises with Counter Support  - 2 x daily - 7 x weekly - 2 sets - 10 reps  Patient Education - Walking with a Single PoGeneral Arboleda Modified 4 Point Gait Pattern ASSESSMENT:   CLINICAL IMPRESSION: Pt is doing well with progression of strength, balance and mobility.  TUG is improved to 27 seconds with use of hemiwalker, significant improvement since the start of care.  Pt with improved stability and performs sit to stand with min/mod use of Lt UE.  5x sit to stand is 30 seconds, something she was not able to do at evaluation due to pain in Lt UE.  Both tests indicate falls risk and we will work to improve this as strength and mobility improve.  PT advanced HEP today and worked on gait with a cane.  Pt will work on this at home for short distances. She is going to try both standard and quad cane to determine which she likes best.  Pt started OT last week and is liking it very much. Patient will benefit from skilled PT to address the below impairments and improve overall function.    OBJECTIVE IMPAIRMENTS: Abnormal gait, decreased activity tolerance, decreased balance, decreased mobility, difficulty walking, decreased strength, decreased safety awareness, impaired perceived functional ability, impaired flexibility, postural dysfunction, and pain.    ACTIVITY LIMITATIONS: carrying, lifting, sitting, standing, stairs, transfers, hygiene/grooming, and locomotion level   PARTICIPATION LIMITATIONS: meal prep, cleaning, laundry, driving, shopping, and community activity   PERSONAL FACTORS: Age, Past/current experiences, and 1-2 comorbidities: CVA, falls, Rt hip fracture with ORIF  are also affecting patient's functional outcome.    REHAB POTENTIAL: Good   CLINICAL DECISION MAKING: Evolving/moderate complexity   EVALUATION COMPLEXITY: Moderate     GOALS: Goals reviewed with patient? Yes   SHORT TERM  GOALS: Target date: 09/06/2022   Be independent in initial HEP Baseline: Goal status: Goal met 08/14/22   2.  Improve LE strength to perform sit to stand with moderate Lt UE support Baseline:  Goal status: Goal met 08/28/22  3.  Perform TUG in < or = to 60 seconds to reduce falls risk Baseline: 27 seconds (09/27/22) Goal status: MET          LONG TERM GOALS: Target date:  11/22/22   Be independent in advanced HEP Baseline: independent in current HEP and further progress is needed  Goal status: in progress    2.  Perform TUG in < or = to 15 seconds to reduce falls risk Baseline: 27 seconds (09/27/22) Goal status: REVISED    3.  Improve LE strength to perform sit to stand with min to no UE support  Baseline: min to moderate Lt UE support (09/27/22) Goal status: in progress    4.  Ambulate with hemi walker with supervision or no guard due to improved gait Baseline: able to do this Goal status: MET   5.  Demonstrate > or = to 4-/5 to 4/5 Rt hip and knee strength to improve safety and stability  Baseline: see above chart (09/27/22) Goal status: In progress   6. Ambulate with cane for all distances and demonstrate independence due to improved stability  Baseline: using hemiwalker  Goal status: INITIAL     PLAN:   PT FREQUENCY: 1-2x/week   PT DURATION: 8 weeks   PLANNED INTERVENTIONS: Therapeutic exercises, Therapeutic activity, Neuromuscular re-education, Balance training, Gait training, Patient/Family education, Self Care, Joint mobilization, Stair training, Dry Needling, Electrical stimulation, Cryotherapy, Moist heat, Taping, Manual therapy, and Re-evaluation   PLAN FOR NEXT SESSION: Continue to work on gait , strength and balance   Sigurd Sos, PT 09/27/22 1:30 PM

## 2022-09-28 DIAGNOSIS — M79672 Pain in left foot: Secondary | ICD-10-CM | POA: Diagnosis not present

## 2022-09-28 DIAGNOSIS — E538 Deficiency of other specified B group vitamins: Secondary | ICD-10-CM | POA: Diagnosis not present

## 2022-09-28 DIAGNOSIS — I10 Essential (primary) hypertension: Secondary | ICD-10-CM | POA: Diagnosis not present

## 2022-09-28 DIAGNOSIS — F321 Major depressive disorder, single episode, moderate: Secondary | ICD-10-CM | POA: Diagnosis not present

## 2022-10-03 ENCOUNTER — Ambulatory Visit (INDEPENDENT_AMBULATORY_CARE_PROVIDER_SITE_OTHER): Payer: Medicare PPO

## 2022-10-03 DIAGNOSIS — I639 Cerebral infarction, unspecified: Secondary | ICD-10-CM | POA: Diagnosis not present

## 2022-10-03 LAB — CUP PACEART REMOTE DEVICE CHECK
Date Time Interrogation Session: 20240101005400
Implantable Pulse Generator Implant Date: 20230815
Pulse Gen Serial Number: 183424

## 2022-10-04 ENCOUNTER — Ambulatory Visit: Payer: Medicare PPO | Attending: Family Medicine | Admitting: Occupational Therapy

## 2022-10-04 DIAGNOSIS — M6281 Muscle weakness (generalized): Secondary | ICD-10-CM | POA: Diagnosis not present

## 2022-10-04 DIAGNOSIS — R2681 Unsteadiness on feet: Secondary | ICD-10-CM | POA: Diagnosis not present

## 2022-10-04 DIAGNOSIS — M79601 Pain in right arm: Secondary | ICD-10-CM | POA: Diagnosis not present

## 2022-10-04 DIAGNOSIS — I69354 Hemiplegia and hemiparesis following cerebral infarction affecting left non-dominant side: Secondary | ICD-10-CM | POA: Diagnosis not present

## 2022-10-04 DIAGNOSIS — R293 Abnormal posture: Secondary | ICD-10-CM | POA: Diagnosis not present

## 2022-10-04 DIAGNOSIS — R278 Other lack of coordination: Secondary | ICD-10-CM | POA: Diagnosis not present

## 2022-10-04 NOTE — Therapy (Signed)
OUTPATIENT OCCUPATIONAL THERAPY  Treatment Session  Patient Name: Angela Buchanan MRN: [id number] DOB:1951/07/15, 72 y.o., female Today's Date: 10/04/2022  PCP: Glenis Smoker, MD REFERRING PROVIDER: Glenis Smoker, MD  END OF SESSION:  OT End of Session - 10/04/22 1537     Visit Number 5    Number of Visits 17    Date for OT Re-Evaluation 11/10/22    Authorization Type [employer]    OT Start Time 1536    OT Stop Time 1616    OT Time Calculation (min) 40 min                 Past Medical History:  Diagnosis Date   Atypical chest pain 07/19/2021   Bigeminy    no current med.   Breast cancer ([place]) 06/2017   right   Bruises easily    Carotid bruit 11/13/2019   Dental crowns present    History of diverticulitis    Hypertension    states under control with med., has been on med. x 5 yr.   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   Past Surgical History:  Procedure Laterality Date   ABDOMINAL HYSTERECTOMY     partial   BREAST LUMPECTOMY Right 06/22/2017   Malignant   BREAST LUMPECTOMY WITH RADIOACTIVE SEED AND SENTINEL LYMPH NODE BIOPSY Right 06/22/2017   Procedure: RIGHT BREAST LUMPECTOMY WITH RADIOACTIVE SEED AND RIGHT AXILLARY DEEP SENTINEL LYMPH NODE BIOPSY WITH BLUE DYE INJECTION;  Surgeon: Fanny Skates, MD;  Location: [facility];  Service: General;  Laterality: Right;   BREAST LUMPECTOMY WITH RADIOACTIVE SEED LOCALIZATION Left 06/22/2017   Procedure: LEFT BREAST LUMPECTOMY WITH RADIOACTIVE SEED LOCALIZATION;  Surgeon: Fanny Skates, MD;  Location: [facility];  Service: General;  Laterality: Left;   CATARACT EXTRACTION W/ INTRAOCULAR LENS  IMPLANT, BILATERAL Bilateral     EXCISION OF BREAST BIOPSY Left 06/22/2017   benign   LOOP RECORDER INSERTION N/A 05/16/2022   Procedure: LOOP RECORDER INSERTION;  Surgeon: Vickie Epley, MD;  Location: [facility];  Service: Cardiovascular;  Laterality: N/A;   PERCUTANEOUS PINNING Right 06/22/2022   Procedure: PERCUTANEOUS PINNING OF RIGHT HIP;  Surgeon: Hiram Gash, MD;  Location: [facility];  Service: Orthopedics;  Laterality: Right;   TONSILLECTOMY     age 49   Patient Active Problem List   Diagnosis Date Noted   Malnutrition of moderate degree 06/22/2022   Closed fracture of femur, neck ([place]) 06/21/2022   Urinary frequency 06/21/2022   Hypokalemia 06/21/2022   Heart murmur 06/21/2022   Hypothyroidism 06/21/2022   Dysphagia 05/17/2022   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   CVA (cerebral vascular accident) ([place]) 05/12/2022   Osteoporosis 01/24/2022   Atypical chest pain 07/19/2021   Temporomandibular joint (TMJ) pain 07/16/2020   Referred otalgia of both ears 07/16/2020   PAC (premature atrial contraction) 05/11/2020   PVC (premature ventricular contraction) 05/11/2020   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   Carotid bruit 11/13/2019   Pure hypercholesterolemia 11/13/2019   Malignant neoplasm of upper-outer quadrant of right breast in female, estrogen receptor positive ([place]) 05/22/2017   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 Pt accompanied by: self and significant other (Husband, [name])  PERTINENT HISTORY: hypertension, breast cancer, hyperlipidemia, Lt hip fracture secondary to fall 06/2022, CVA 06/2022   PRECAUTIONS: Fall, Rt breast cancer with node removal, osteoporosis  WEIGHT BEARING RESTRICTIONS: No  PAIN:  Are you having pain? Yes: NPRS scale: 8/10 Pain location: R shoulder and upper arm Pain description: aching Aggravating factors: movement to arm Relieving factors: repositioning  FALLS: Has patient fallen in last 6 months? Yes. Number of falls 1 fall, one week after d/c home from SNF  LIVING ENVIRONMENT: Lives with: lives with their spouse Lives in: House/apartment Stairs: Yes: External: 2 steps; on left going up Has following equipment at home: Gilford Rile - 2 wheeled, Hemi walker, Wheelchair (manual), shower chair, and Grab bars  PLOF: Independent and Independent with basic ADLs  PATIENT GOALS: to get use of my R arm again  OBJECTIVE:   HAND DOMINANCE: Right  ADLs: Transfers/ambulation related to ADLs: Mod I with hemi-walker Eating: using L hand for eating, husband is assisting with cutting up food Grooming: Mod I, using L hand UB Dressing: Mod assist LB Dressing: Max assist Toileting: Mod I Bathing: Supervision - Mod I Tub Shower transfers: Herbalist with back and Grab bars  UPPER EXTREMITY ROM:    Active ROM Right eval Left eval  Shoulder flexion 54   Shoulder abduction    Shoulder adduction    Shoulder extension    Shoulder internal rotation    Shoulder external rotation    Elbow flexion 108   Elbow extension -24   Wrist flexion    Wrist extension    Wrist ulnar deviation    Wrist radial deviation    Wrist pronation    Wrist supination    (Blank rows = not tested)  Passive ROM Right eval Left eval  Shoulder flexion 65   Shoulder abduction    Shoulder adduction    Shoulder extension    Shoulder internal  rotation    Shoulder external rotation    Elbow flexion    Elbow extension -17   Wrist flexion 46   Wrist extension 27   Wrist ulnar deviation    Wrist radial deviation    Wrist pronation    Wrist supination    (Blank rows = not tested)   UPPER EXTREMITY MMT:   not assessed due to pain with any PROM, AROM    HAND FUNCTION: Loose gross grasp   COORDINATION: Box and Blocks:  Right 2blocks, Left 45 blocks. Modified to complete with RUE.  Pt able to grasp and move across to other side of box with barrier removed.    SENSATION: Mild decrease in R  EDEMA: mild edema in wrist hand, and fingers on RUE  OBSERVATIONS: Pt guarded with movement, hypersensitive to touch and PROM.     TODAY'S TREATMENT:  10/04/22 Modalities: moist heat applied to R hand prior to engaging in [place]. PROM: OT providing passive stretch to 3rd and 4th digits at PIP and DIP joints to facilitate increased extension.  Pt tolerating increased extension with onset of pain towards end range.  OT providing verbal cues and demonstration for hand placement with stretch, educating both pt and spouse.   AAROM: following heat and stretch, pt demonstrating increased digit extension with open/close of hand.   Grasp/release: engaged in picking up and placing large grip pegs into peg board with therapist holding pegs upright to allow for increased ease of grasp. OT then challenging pt to remove pegs from peg board, requiring increased effort and gross grasp.   09/27/22 Jacket: pt initially asking spouse to take off her jacket.  OT provided cues for technique, pt with increased time but able to complete without physical assistance.   PROM/AAROM: OT answered pt specific questions about previous exercises.  OT providing initial demonstration, hand over hand, fading to min cues for technique with forearm pronation/supination as well as isolated  finger flexion to facilitate thumb opposition.   PROM: OT instructed pt in composite finger flexion/extension with use of LUE to facilitate full ROM.  Pt tolerating improved ROM with flexion, nearly touching palm.  However continues to complain of pain with finger extension. Modalities: discussed use of heat prior to exercises/stretches while educating on impact of sensation impairments with use of heat.  Pt sensation appears intact. Grasp/release: attempted to focus on finger flexion/extension to grasp/release cones.  Pt demonstrating min index finger extension allowing for opening of hand, but no extension in any other fingers.  OT providing cues to attempt grasp/release prior to sliding hand up/down cone.   09/22/22 UE Ranger: engaged in shoulder flexion/extension and horizontal abduction/adduction with focus on motor control. Pt demonstrating mild perturbations during shoulder flexion/extension, therefore OT providing verbal cues to visually attend to RUE to increase motor control.   UE exercise: with focus on finger extension - reviewing exercises and instructions from previous session, thumb opposition with attempts at AROM and then with blocking at other fingers to allow for increased movement.  Pt demonstrating inability to oppose thumb to 4th of 5th digits, but with blocking able to touch to side of ring finger.   Therapeutic activity: engaged in picking up large grip pegs with R hand.  OT encouraging pt to open hand as much as possible to grasp pegs vs scooping hand around pegs.  Pt demonstrating decreased gross motor control, knocking pegs over when returning to grasp next peg.  OT providing cues to visually attend to RUE during all movements due to decreased proprioception.  Increased challenge to picking up with pincer grasp to challenge sustained grasp and index finger isolation.  Due to ability to complete with min difficulty, further challenged to picking up with pincer grasp and then  pronating 90* to release pegs.   PATIENT EDUCATION: Education details: educated on shoulder, forearm, and finger exercises Person educated: Patient and Spouse Education method: Explanation, Demonstration, Tactile cues, Verbal cues, and Handouts Education comprehension: verbalized understanding and needs further education  HOME EXERCISE PROGRAM: Access Code: [email or web address] URL: [email or web address] Date: 09/27/2022 Prepared by: [facility]  Exercises - Supine Shoulder Flexion AAROM with Dowel  - 1 x daily - 3 x weekly - 2 sets - 10 reps - Supine Shoulder Protraction with Dowel  - 1 x daily - 3 x weekly - 2 sets - 10 reps -  Supine Shoulder Abduction AAROM with Dowel  - 1 x daily - 3 x weekly - 2 sets - 10 reps - Seated Forearm Pronation Supination AROM  - 1 x daily - 7 x weekly - 2 sets - 10 reps - Hand PROM Finger Extension  - 1 x daily - 7 x weekly - 2 sets - 10 reps - Thumb AROM Opposition  - 1 x daily - 7 x weekly - 1 sets - 5 reps - Seated Isolated Finger PIP Flexion AROM  - 1 x daily - 7 x weekly - 1 sets - 5 reps - Seated Finger Composite Flexion Stretch  - 1 x daily - 7 x weekly - 1 sets - 5 reps   GOALS: Goals reviewed with patient? Yes  SHORT TERM GOALS: Target date: 10/13/22  Pt and spouse will be independent with ROM and coordination HEP. Baseline: Goal status: IN PROGRESS  2.  Pt and spouse will verbalize understanding of supine and sitting positioning to decrease edema and pain in RUE. Baseline:  Goal status: IN PROGRESS  3.  Pt will verbalize understanding of task modifications and/or potential AE needs to increase ease, safety, and independence w/ ADLs. Baseline:  Goal status: IN PROGRESS  4.  Pt will be able to complete UB dressing with min assist demonstrating/verbalizing understanding of hemi-dressing technique. Baseline:  Goal status: IN PROGRESS   LONG TERM GOALS: Target date: 11/10/22  Pt will be able to  complete UB dressing with Supervision assist (to include donning/fastening bra PRN) demonstrating/verbalizing understanding of hemi-dressing technique. Baseline:  Goal status: IN PROGRESS  2.  Pt will be able to incorporate use of RUE at non-dominant level during LB dressing, to be completed at Mod I level. Baseline:  Goal status: IN PROGRESS  3.  Pt will demonstrate ability to pick up light weight object from table top as needed to increase engagement of RUE into ADLs and self-eeding. Baseline:  Goal status: IN PROGRESS  4.  Pt will demonstrate improved UE functional use for ADLs as evidenced by increasing box/ blocks score by 5 blocks with RUE, to be completed in standardized manner. Baseline: Right 2blocks, Left 45 blocks. Modified to complete with RUE.  Pt able to grasp and move across to other side of box with barrier removed.   Goal status: IN PROGRESS  5.  Pt will report decreased pain in dominant RUE interfering with ADLs/IADLs by decreased impairment score on Quick DASH. Baseline: 77% Goal status: IN PROGRESS  6.  Pt will be independent with splint wear and care PRN Baseline: splint needs TBD Goal status: IN PROGRESS  ASSESSMENT:  CLINICAL IMPRESSION: Pt demonstrating carryover of education from previous sessions, benefiting from focus on sustained stretch with focus on DIP and PIP joints for further extension. Pt tolerating increased finger flexion and extension this session, even able to replicate after stretching. Pt continues to demonstrate increased stiffness in fingers, impacting grasp and release.  Pt with tolerating sustained mod stretch, however with onset of pain with attempts at further stretching fingers to full extension.    PERFORMANCE DEFICITS: in functional skills including ADLs, IADLs, coordination, sensation, edema, tone, ROM, strength, pain, flexibility, Fine motor control, Gross motor control, balance, body mechanics, endurance, decreased knowledge of use of  DME, skin integrity, vision, and UE functional use, cognitive skills including emotional and safety awareness, and psychosocial skills including coping strategies.   IMPAIRMENTS: are limiting patient from ADLs, IADLs, and rest and sleep.   CO-MORBIDITIES: may have co-morbidities  that affects occupational performance. Patient will benefit from skilled OT to address above impairments and improve overall function.  MODIFICATION OR ASSISTANCE TO COMPLETE EVALUATION: Min-Moderate modification of tasks or assist with assess necessary to complete an evaluation.  OT OCCUPATIONAL PROFILE AND HISTORY: Detailed assessment: Review of records and additional review of physical, cognitive, psychosocial history related to current functional performance.  CLINICAL DECISION MAKING: Moderate - several treatment options, min-mod task modification necessary  REHAB POTENTIAL: Good  EVALUATION COMPLEXITY: Moderate    PLAN:  OT FREQUENCY: 2x/week  OT DURATION: 8 weeks  PLANNED INTERVENTIONS: self care/ADL training, therapeutic exercise, therapeutic activity, neuromuscular re-education, manual therapy, passive range of motion, balance training, functional mobility training, splinting, paraffin, fluidotherapy, biofeedback, compression bandaging, moist heat, cryotherapy, contrast bath, patient/family education, visual/perceptual remediation/compensation, psychosocial skills training, coping strategies training, and DME and/or AE instructions  RECOMMENDED OTHER SERVICES: NA  CONSULTED AND AGREED WITH PLAN OF CARE: Patient and family member/caregiver  PLAN FOR NEXT SESSION: PROM, massage, and stretching.  Assess need for splinting.   Simonne Come, OTR/L 10/04/2022, 3:37 PM

## 2022-10-05 ENCOUNTER — Ambulatory Visit: Payer: Medicare PPO | Admitting: Occupational Therapy

## 2022-10-05 DIAGNOSIS — R278 Other lack of coordination: Secondary | ICD-10-CM | POA: Diagnosis not present

## 2022-10-05 DIAGNOSIS — I69354 Hemiplegia and hemiparesis following cerebral infarction affecting left non-dominant side: Secondary | ICD-10-CM | POA: Diagnosis not present

## 2022-10-05 DIAGNOSIS — R2681 Unsteadiness on feet: Secondary | ICD-10-CM | POA: Diagnosis not present

## 2022-10-05 DIAGNOSIS — M79601 Pain in right arm: Secondary | ICD-10-CM | POA: Diagnosis not present

## 2022-10-05 DIAGNOSIS — M6281 Muscle weakness (generalized): Secondary | ICD-10-CM | POA: Diagnosis not present

## 2022-10-05 DIAGNOSIS — R293 Abnormal posture: Secondary | ICD-10-CM | POA: Diagnosis not present

## 2022-10-05 NOTE — Therapy (Signed)
OUTPATIENT OCCUPATIONAL THERAPY  Treatment Session  Patient Name: Angela Buchanan MRN: [id number] DOB:06/26/1951, 72 y.o., female Today's Date: 10/05/2022  PCP: Glenis Smoker, MD REFERRING PROVIDER: Glenis Smoker, MD  END OF SESSION:  OT End of Session - 10/05/22 1153     Visit Number 6    Number of Visits 17    Date for OT Re-Evaluation 11/10/22    Authorization Type [employer]    OT Start Time 1150    OT Stop Time 1230    OT Time Calculation (min) 40 min                 Past Medical History:  Diagnosis Date   Atypical chest pain 07/19/2021   Bigeminy    no current med.   Breast cancer ([place]) 06/2017   right   Bruises easily    Carotid bruit 11/13/2019   Dental crowns present    History of diverticulitis    Hypertension    states under control with med., has been on med. x 5 yr.   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   Past Surgical History:  Procedure Laterality Date   ABDOMINAL HYSTERECTOMY     partial   BREAST LUMPECTOMY Right 06/22/2017   Malignant   BREAST LUMPECTOMY WITH RADIOACTIVE SEED AND SENTINEL LYMPH NODE BIOPSY Right 06/22/2017   Procedure: RIGHT BREAST LUMPECTOMY WITH RADIOACTIVE SEED AND RIGHT AXILLARY DEEP SENTINEL LYMPH NODE BIOPSY WITH BLUE DYE INJECTION;  Surgeon: Fanny Skates, MD;  Location: [facility];  Service: General;  Laterality: Right;   BREAST LUMPECTOMY WITH RADIOACTIVE SEED LOCALIZATION Left 06/22/2017   Procedure: LEFT BREAST LUMPECTOMY WITH RADIOACTIVE SEED LOCALIZATION;  Surgeon: Fanny Skates, MD;  Location: [facility];  Service: General;  Laterality: Left;   CATARACT EXTRACTION W/ INTRAOCULAR LENS  IMPLANT, BILATERAL Bilateral     EXCISION OF BREAST BIOPSY Left 06/22/2017   benign   LOOP RECORDER INSERTION N/A 05/16/2022   Procedure: LOOP RECORDER INSERTION;  Surgeon: Vickie Epley, MD;  Location: [facility];  Service: Cardiovascular;  Laterality: N/A;   PERCUTANEOUS PINNING Right 06/22/2022   Procedure: PERCUTANEOUS PINNING OF RIGHT HIP;  Surgeon: Hiram Gash, MD;  Location: [facility];  Service: Orthopedics;  Laterality: Right;   TONSILLECTOMY     age 1   Patient Active Problem List   Diagnosis Date Noted   Malnutrition of moderate degree 06/22/2022   Closed fracture of femur, neck ([place]) 06/21/2022   Urinary frequency 06/21/2022   Hypokalemia 06/21/2022   Heart murmur 06/21/2022   Hypothyroidism 06/21/2022   Dysphagia 05/17/2022   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   CVA (cerebral vascular accident) ([place]) 05/12/2022   Osteoporosis 01/24/2022   Atypical chest pain 07/19/2021   Temporomandibular joint (TMJ) pain 07/16/2020   Referred otalgia of both ears 07/16/2020   PAC (premature atrial contraction) 05/11/2020   PVC (premature ventricular contraction) 05/11/2020   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   Carotid bruit 11/13/2019   Pure hypercholesterolemia 11/13/2019   Malignant neoplasm of upper-outer quadrant of right breast in female, estrogen receptor positive ([place]) 05/22/2017   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 Pt accompanied by: self and significant other (Husband, [name])  PERTINENT HISTORY: hypertension, breast cancer, hyperlipidemia, Lt hip fracture secondary to fall 06/2022, CVA 06/2022   PRECAUTIONS: Fall, Rt breast cancer with node removal, osteoporosis  WEIGHT BEARING RESTRICTIONS: No  PAIN:  Are you having pain? Yes: NPRS scale: 5/10 Pain location: R shoulder and upper arm Pain description: aching Aggravating factors: movement to arm Relieving factors: repositioning  FALLS: Has patient fallen in last 6 months? Yes. Number of falls 1 fall, one week after d/c home from SNF  LIVING ENVIRONMENT: Lives with: lives with their spouse Lives in: House/apartment Stairs: Yes: External: 2 steps; on left going up Has following equipment at home: Gilford Rile - 2 wheeled, Hemi walker, Wheelchair (manual), shower chair, and Grab bars  PLOF: Independent and Independent with basic ADLs  PATIENT GOALS: to get use of my R arm again  OBJECTIVE:   HAND DOMINANCE: Right  ADLs: Transfers/ambulation related to ADLs: Mod I with hemi-walker Eating: using L hand for eating, husband is assisting with cutting up food Grooming: Mod I, using L hand UB Dressing: Mod assist LB Dressing: Max assist Toileting: Mod I Bathing: Supervision - Mod I Tub Shower transfers: Herbalist with back and Grab bars  UPPER EXTREMITY ROM:    Active ROM Right eval Left eval  Shoulder flexion 54   Shoulder abduction    Shoulder adduction    Shoulder extension    Shoulder internal rotation    Shoulder external rotation    Elbow flexion 108   Elbow extension -24   Wrist flexion    Wrist extension    Wrist ulnar deviation    Wrist radial deviation    Wrist pronation    Wrist supination    (Blank rows = not tested)  Passive ROM Right eval Left eval  Shoulder flexion 65   Shoulder abduction    Shoulder adduction    Shoulder extension    Shoulder internal rotation     Shoulder external rotation    Elbow flexion    Elbow extension -17   Wrist flexion 46   Wrist extension 27   Wrist ulnar deviation    Wrist radial deviation    Wrist pronation    Wrist supination    (Blank rows = not tested)   UPPER EXTREMITY MMT:   not assessed due to pain with any PROM, AROM    HAND FUNCTION: Loose gross grasp   COORDINATION: Box and Blocks:  Right 2blocks, Left 45 blocks. Modified to complete with RUE.  Pt able to grasp and move across to other side of box with barrier removed.    SENSATION: Mild decrease in R  EDEMA: mild edema in wrist hand, and fingers on RUE  OBSERVATIONS: Pt guarded with movement, hypersensitive to touch and PROM.     TODAY'S TREATMENT:  10/05/21 NMR: discussed options to treat extensor tone with possibility of NMES if medically appropriate.  Pt does have loop recorder placed and h/o breast cancer so may not be a candidate. Splinting: Discussed splinting options purchase of off the shelf vs custom resting hand splint for continued focus on positioning of hand, especially as she reports waking up in pain and increased flexion in her hand in the mornings upon waking.   Splinting: OT utilized built up handle tubing in conjunction with splinting velcro and strapping to fabricate an splint for increased DIP and PIP extension/support.  Educated on placing wash cloth around to increase circumference, OT providing demonstration and assessing fit. Wrist extension: utilized medium sized ball on table top to facilitate wrist extension.  OT providing demonstration and additional cues for technique as pt compensating with shoulder hike and ulnar rotation.  Discussed household items to attempt this ROM/stretch at home.  Pt demonstrating mild improvements in wrist extension as well as sustained partial finger extension with wrist flexion/extension on ball.   10/04/22 Modalities:  moist heat applied to R hand prior to engaging in [place]. PROM: OT providing passive stretch to 3rd and 4th digits at PIP and DIP joints to facilitate increased extension.  Pt tolerating increased extension with onset of pain towards end range.  OT providing verbal cues and demonstration for hand placement with stretch, educating both pt and spouse.   AAROM: following heat and stretch, pt demonstrating increased digit extension with open/close of hand.   Grasp/release: engaged in picking up and placing large grip pegs into peg board with therapist holding pegs upright to allow for increased ease of grasp. OT then challenging pt to remove pegs from peg board, requiring increased effort and gross grasp.   09/27/22 Jacket: pt initially asking spouse to take off her jacket.  OT provided cues for technique, pt with increased time but able to complete without physical assistance.   PROM/AAROM: OT answered pt specific questions about previous exercises.  OT providing initial demonstration, hand over hand, fading to min cues for technique with forearm pronation/supination as well as isolated finger flexion to facilitate thumb opposition.   PROM: OT instructed pt in composite finger flexion/extension with use of LUE to facilitate full ROM.  Pt tolerating improved ROM with flexion, nearly touching palm.  However continues to complain of pain with finger extension. Modalities: discussed use of heat prior to exercises/stretches while educating on impact of sensation impairments with use of heat.  Pt sensation appears intact. Grasp/release: attempted to focus on finger flexion/extension to grasp/release cones.  Pt demonstrating min index finger extension allowing for opening of hand, but no extension in any other fingers.  OT providing cues to attempt grasp/release prior to sliding hand up/down cone.  PATIENT EDUCATION: Education details: educated on options for treatment of extensor tone and possibility  of splinting recommendations. Person educated: Patient and Spouse Education method: Explanation, Demonstration, Tactile cues, Verbal cues, and Handouts Education comprehension: verbalized understanding and needs further education  HOME EXERCISE PROGRAM: Access Code: [email or web address] URL: [email or web address] Date: 09/27/2022 Prepared by: [facility]  Exercises - Supine Shoulder Flexion AAROM with Dowel  - 1 x daily - 3 x weekly - 2 sets - 10 reps - Supine Shoulder Protraction with Dowel  - 1 x daily - 3 x weekly - 2 sets - 10 reps - Supine Shoulder Abduction AAROM with Dowel  - 1 x daily - 3 x weekly - 2 sets - 10  reps - Seated Forearm Pronation Supination AROM  - 1 x daily - 7 x weekly - 2 sets - 10 reps - Hand PROM Finger Extension  - 1 x daily - 7 x weekly - 2 sets - 10 reps - Thumb AROM Opposition  - 1 x daily - 7 x weekly - 1 sets - 5 reps - Seated Isolated Finger PIP Flexion AROM  - 1 x daily - 7 x weekly - 1 sets - 5 reps - Seated Finger Composite Flexion Stretch  - 1 x daily - 7 x weekly - 1 sets - 5 reps   GOALS: Goals reviewed with patient? Yes  SHORT TERM GOALS: Target date: 10/13/22  Pt and spouse will be independent with ROM and coordination HEP. Baseline: Goal status: IN PROGRESS  2.  Pt and spouse will verbalize understanding of supine and sitting positioning to decrease edema and pain in RUE. Baseline:  Goal status: IN PROGRESS  3.  Pt will verbalize understanding of task modifications and/or potential AE needs to increase ease, safety, and independence w/ ADLs. Baseline:  Goal status: IN PROGRESS  4.  Pt will be able to complete UB dressing with min assist demonstrating/verbalizing understanding of hemi-dressing technique. Baseline:  Goal status: IN PROGRESS   LONG TERM GOALS: Target date: 11/10/22  Pt will be able to complete UB dressing with Supervision assist (to include donning/fastening bra PRN)  demonstrating/verbalizing understanding of hemi-dressing technique. Baseline:  Goal status: IN PROGRESS  2.  Pt will be able to incorporate use of RUE at non-dominant level during LB dressing, to be completed at Mod I level. Baseline:  Goal status: IN PROGRESS  3.  Pt will demonstrate ability to pick up light weight object from table top as needed to increase engagement of RUE into ADLs and self-eeding. Baseline:  Goal status: IN PROGRESS  4.  Pt will demonstrate improved UE functional use for ADLs as evidenced by increasing box/ blocks score by 5 blocks with RUE, to be completed in standardized manner. Baseline: Right 2blocks, Left 45 blocks. Modified to complete with RUE.  Pt able to grasp and move across to other side of box with barrier removed.   Goal status: IN PROGRESS  5.  Pt will report decreased pain in dominant RUE interfering with ADLs/IADLs by decreased impairment score on Quick DASH. Baseline: 77% Goal status: IN PROGRESS  6.  Pt will be independent with splint wear and care PRN Baseline: splint needs TBD Goal status: IN PROGRESS  ASSESSMENT:  CLINICAL IMPRESSION: Pt demonstrating improved finger extension at beginning of session.  Focused on wrist extension while maintaining partial finger extension with focus on quality of movement.  Pt will most likely benefit from resting hand splint to continue to address finger extension at rest.   PERFORMANCE DEFICITS: in functional skills including ADLs, IADLs, coordination, sensation, edema, tone, ROM, strength, pain, flexibility, Fine motor control, Gross motor control, balance, body mechanics, endurance, decreased knowledge of use of DME, skin integrity, vision, and UE functional use, cognitive skills including emotional and safety awareness, and psychosocial skills including coping strategies.   IMPAIRMENTS: are limiting patient from ADLs, IADLs, and rest and sleep.   CO-MORBIDITIES: may have co-morbidities  that affects  occupational performance. Patient will benefit from skilled OT to address above impairments and improve overall function.  MODIFICATION OR ASSISTANCE TO COMPLETE EVALUATION: Min-Moderate modification of tasks or assist with assess necessary to complete an evaluation.  OT OCCUPATIONAL PROFILE AND HISTORY: Detailed assessment: Review of records  and additional review of physical, cognitive, psychosocial history related to current functional performance.  CLINICAL DECISION MAKING: Moderate - several treatment options, min-mod task modification necessary  REHAB POTENTIAL: Good  EVALUATION COMPLEXITY: Moderate    PLAN:  OT FREQUENCY: 2x/week  OT DURATION: 8 weeks  PLANNED INTERVENTIONS: self care/ADL training, therapeutic exercise, therapeutic activity, neuromuscular re-education, manual therapy, passive range of motion, balance training, functional mobility training, splinting, paraffin, fluidotherapy, biofeedback, compression bandaging, moist heat, cryotherapy, contrast bath, patient/family education, visual/perceptual remediation/compensation, psychosocial skills training, coping strategies training, and DME and/or AE instructions  RECOMMENDED OTHER SERVICES: NA  CONSULTED AND AGREED WITH PLAN OF CARE: Patient and family member/caregiver  PLAN FOR NEXT SESSION: PROM, massage, and stretching.  Pursue splinting as schedule allows (possibly 1/17)   Kenyette Gundy, [place], OTR/L 10/05/2022, 11:54 AM

## 2022-10-09 ENCOUNTER — Ambulatory Visit: Payer: Medicare PPO | Admitting: Occupational Therapy

## 2022-10-09 ENCOUNTER — Encounter: Payer: Medicare PPO | Admitting: Physical Therapy

## 2022-10-09 DIAGNOSIS — M6281 Muscle weakness (generalized): Secondary | ICD-10-CM

## 2022-10-09 DIAGNOSIS — M79601 Pain in right arm: Secondary | ICD-10-CM | POA: Diagnosis not present

## 2022-10-09 DIAGNOSIS — R278 Other lack of coordination: Secondary | ICD-10-CM

## 2022-10-09 DIAGNOSIS — I69354 Hemiplegia and hemiparesis following cerebral infarction affecting left non-dominant side: Secondary | ICD-10-CM | POA: Diagnosis not present

## 2022-10-09 DIAGNOSIS — R293 Abnormal posture: Secondary | ICD-10-CM | POA: Diagnosis not present

## 2022-10-09 DIAGNOSIS — R2681 Unsteadiness on feet: Secondary | ICD-10-CM | POA: Diagnosis not present

## 2022-10-09 NOTE — Progress Notes (Signed)
[place] [facility] [employer]  

## 2022-10-09 NOTE — Therapy (Signed)
OUTPATIENT OCCUPATIONAL THERAPY  Treatment Session  Patient Name: Angela Buchanan MRN: [id number] DOB:1951-05-23, 72 y.o., female Today's Date: 10/09/2022  PCP: Glenis Smoker, MD REFERRING PROVIDER: Glenis Smoker, MD  END OF SESSION:  OT End of Session - 10/09/22 1405     Visit Number 7    Number of Visits 17    Date for OT Re-Evaluation 11/10/22    Authorization Type [employer]    OT Start Time 1404    OT Stop Time 1444    OT Time Calculation (min) 40 min                  Past Medical History:  Diagnosis Date   Atypical chest pain 07/19/2021   Bigeminy    no current med.   Breast cancer ([place]) 06/2017   right   Bruises easily    Carotid bruit 11/13/2019   Dental crowns present    History of diverticulitis    Hypertension    states under control with med., has been on med. x 5 yr.   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   Past Surgical History:  Procedure Laterality Date   ABDOMINAL HYSTERECTOMY     partial   BREAST LUMPECTOMY Right 06/22/2017   Malignant   BREAST LUMPECTOMY WITH RADIOACTIVE SEED AND SENTINEL LYMPH NODE BIOPSY Right 06/22/2017   Procedure: RIGHT BREAST LUMPECTOMY WITH RADIOACTIVE SEED AND RIGHT AXILLARY DEEP SENTINEL LYMPH NODE BIOPSY WITH BLUE DYE INJECTION;  Surgeon: Fanny Skates, MD;  Location: [facility];  Service: General;  Laterality: Right;   BREAST LUMPECTOMY WITH RADIOACTIVE SEED LOCALIZATION Left 06/22/2017   Procedure: LEFT BREAST LUMPECTOMY WITH RADIOACTIVE SEED LOCALIZATION;  Surgeon: Fanny Skates, MD;  Location: [facility];  Service: General;  Laterality: Left;   CATARACT EXTRACTION W/ INTRAOCULAR LENS  IMPLANT, BILATERAL  Bilateral    EXCISION OF BREAST BIOPSY Left 06/22/2017   benign   LOOP RECORDER INSERTION N/A 05/16/2022   Procedure: LOOP RECORDER INSERTION;  Surgeon: Vickie Epley, MD;  Location: [facility];  Service: Cardiovascular;  Laterality: N/A;   PERCUTANEOUS PINNING Right 06/22/2022   Procedure: PERCUTANEOUS PINNING OF RIGHT HIP;  Surgeon: Hiram Gash, MD;  Location: [facility];  Service: Orthopedics;  Laterality: Right;   TONSILLECTOMY     age 74   Patient Active Problem List   Diagnosis Date Noted   Malnutrition of moderate degree 06/22/2022   Closed fracture of femur, neck ([place]) 06/21/2022   Urinary frequency 06/21/2022   Hypokalemia 06/21/2022   Heart murmur 06/21/2022   Hypothyroidism 06/21/2022   Dysphagia 05/17/2022   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   CVA (cerebral vascular accident) ([place]) 05/12/2022   Osteoporosis 01/24/2022   Atypical chest pain 07/19/2021   Temporomandibular joint (TMJ) pain 07/16/2020   Referred otalgia of both ears 07/16/2020   PAC (premature atrial contraction) 05/11/2020   PVC (premature ventricular contraction) 05/11/2020   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   Carotid bruit 11/13/2019   Pure hypercholesterolemia 11/13/2019   Malignant neoplasm of upper-outer quadrant of right breast in female, estrogen receptor positive ([place]) 05/22/2017   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   Diplopia 09/17/2014   Dizziness and giddiness 09/17/2014   Essential hypertension 07/22/2014  Chest pain 07/22/2014   Family history of heart disease 07/22/2014    ONSET DATE: CVA 05/12/22  REFERRING DIAG: M79.641 (ICD-10-CM) - Pain in right hand  THERAPY DIAG:  Hemiplegia and hemiparesis following cerebral infarction affecting left non-dominant side (HCC)  Pain in right arm  Other lack of coordination  Muscle weakness (generalized)  Rationale for Evaluation and Treatment: Rehabilitation  SUBJECTIVE:   SUBJECTIVE STATEMENT: Pt reports attempting  to wear built up handle tubing 1.5-2 hrs before reporting discomfort and having to remove splint. Pt accompanied by: self and significant other (Husband, [name])  PERTINENT HISTORY: hypertension, breast cancer, hyperlipidemia, Lt hip fracture secondary to fall 06/2022, CVA 06/2022   PRECAUTIONS: Fall, Rt breast cancer with node removal, osteoporosis  WEIGHT BEARING RESTRICTIONS: No  PAIN:  Are you having pain? Yes: NPRS scale: 5/10 Pain location: R shoulder and upper arm Pain description: aching Aggravating factors: movement to arm Relieving factors: repositioning  FALLS: Has patient fallen in last 6 months? Yes. Number of falls 1 fall, one week after d/c home from SNF  LIVING ENVIRONMENT: Lives with: lives with their spouse Lives in: House/apartment Stairs: Yes: External: 2 steps; on left going up Has following equipment at home: Gilford Rile - 2 wheeled, Hemi walker, Wheelchair (manual), shower chair, and Grab bars  PLOF: Independent and Independent with basic ADLs  PATIENT GOALS: to get use of my R arm again  OBJECTIVE:   HAND DOMINANCE: Right  ADLs: Transfers/ambulation related to ADLs: Mod I with hemi-walker Eating: using L hand for eating, husband is assisting with cutting up food Grooming: Mod I, using L hand UB Dressing: Mod assist LB Dressing: Max assist Toileting: Mod I Bathing: Supervision - Mod I Tub Shower transfers: Herbalist with back and Grab bars  UPPER EXTREMITY ROM:    Active ROM Right eval Left eval  Shoulder flexion 54   Shoulder abduction    Shoulder adduction    Shoulder extension    Shoulder internal rotation    Shoulder external rotation    Elbow flexion 108   Elbow extension -24   Wrist flexion    Wrist extension    Wrist ulnar deviation    Wrist radial deviation    Wrist pronation    Wrist supination    (Blank rows = not tested)  Passive ROM Right eval Left eval  Shoulder flexion 65   Shoulder abduction     Shoulder adduction    Shoulder extension    Shoulder internal rotation    Shoulder external rotation    Elbow flexion    Elbow extension -17   Wrist flexion 46   Wrist extension 27   Wrist ulnar deviation    Wrist radial deviation    Wrist pronation    Wrist supination    (Blank rows = not tested)   UPPER EXTREMITY MMT:   not assessed due to pain with any PROM, AROM    HAND FUNCTION: Loose gross grasp   COORDINATION: Box and Blocks:  Right 2blocks, Left 45 blocks. Modified to complete with RUE.  Pt able to grasp and move across to other side of box with barrier removed.    SENSATION: Mild decrease in R  EDEMA: mild edema in wrist hand, and fingers on RUE  OBSERVATIONS: Pt guarded with movement, hypersensitive to touch and PROM.     TODAY'S TREATMENT:  10/09/21 Splint recommendations: Pt asking about custom splint vs pre-fabricated splint.  Pt states that she is worried that she might not be able to tolerate session to make custom splint and asking for suggestions for off the shelf.  OT provided pt and spouse with handout of options while addressing areas of recommendation to ensure appropriate fit.  Pt plans to go look at store and either purchase or pursue custom splinting. Pinch: pt asking when she will be able to feed self with R hand.  Engaged in gross 3 jaw chuck and tip to tip pinch with large jacks.  Pt able to complete with gross grasp when attempting to incorporate 3rd and 4th fingers as well.  OT challenged pt to complete tip pinch and release with min cues to visually attend to UE during task especially with release.  OT encouraged pt to incorporate RUE into self-feeding whether it be with finger foods, sandwiches, and to attempt gross grasp on utensil with self-feeding.  OT increased challenge to incorporate functional reach and internal rotation to simulate pouring.   10/05/21 NMR: discussed options to treat extensor tone with  possibility of NMES if medically appropriate.  Pt does have loop recorder placed and h/o breast cancer so may not be a candidate. Splinting: Discussed splinting options purchase of off the shelf vs custom resting hand splint for continued focus on positioning of hand, especially as she reports waking up in pain and increased flexion in her hand in the mornings upon waking.   Splinting: OT utilized built up handle tubing in conjunction with splinting velcro and strapping to fabricate an splint for increased DIP and PIP extension/support.  Educated on placing wash cloth around to increase circumference, OT providing demonstration and assessing fit. Wrist extension: utilized medium sized ball on table top to facilitate wrist extension.  OT providing demonstration and additional cues for technique as pt compensating with shoulder hike and ulnar rotation.  Discussed household items to attempt this ROM/stretch at home.  Pt demonstrating mild improvements in wrist extension as well as sustained partial finger extension with wrist flexion/extension on ball.   10/04/22 Modalities: moist heat applied to R hand prior to engaging in [place]. PROM: OT providing passive stretch to 3rd and 4th digits at PIP and DIP joints to facilitate increased extension.  Pt tolerating increased extension with onset of pain towards end range.  OT providing verbal cues and demonstration for hand placement with stretch, educating both pt and spouse.   AAROM: following heat and stretch, pt demonstrating increased digit extension with open/close of hand.   Grasp/release: engaged in picking up and placing large grip pegs into peg board with therapist holding pegs upright to allow for increased ease of grasp. OT then challenging pt to remove pegs from peg board, requiring increased effort and gross grasp.   PATIENT EDUCATION: Education details: educated on options for treatment of extensor tone and possibility of splinting  recommendations.  Ongoing condition specific education; see above for specifics. Person educated: Patient and Spouse Education method: Explanation, Demonstration, Tactile cues, Verbal cues, and Handouts Education comprehension: verbalized understanding and needs further education  HOME EXERCISE PROGRAM: Access Code: [email or web address] URL: [email or web address] Date: 09/27/2022 Prepared by: [facility]  Exercises - Supine Shoulder Flexion AAROM with Dow[place]  - 1 x daily - 3 x weekly - 2 sets - 10 reps - Supine Shoulder Protraction with Dowel  - 1 x daily - 3 x weekly - 2 sets - 10  reps - Supine Shoulder Abduction AAROM with Dowel  - 1 x daily - 3 x weekly - 2 sets - 10 reps - Seated Forearm Pronation Supination AROM  - 1 x daily - 7 x weekly - 2 sets - 10 reps - Hand PROM Finger Extension  - 1 x daily - 7 x weekly - 2 sets - 10 reps - Thumb AROM Opposition  - 1 x daily - 7 x weekly - 1 sets - 5 reps - Seated Isolated Finger PIP Flexion AROM  - 1 x daily - 7 x weekly - 1 sets - 5 reps - Seated Finger Composite Flexion Stretch  - 1 x daily - 7 x weekly - 1 sets - 5 reps   GOALS: Goals reviewed with patient? Yes  SHORT TERM GOALS: Target date: 10/13/22  Pt and spouse will be independent with ROM and coordination HEP. Baseline: Goal status: IN PROGRESS  2.  Pt and spouse will verbalize understanding of supine and sitting positioning to decrease edema and pain in RUE. Baseline:  Goal status: IN PROGRESS  3.  Pt will verbalize understanding of task modifications and/or potential AE needs to increase ease, safety, and independence w/ ADLs. Baseline:  Goal status: IN PROGRESS  4.  Pt will be able to complete UB dressing with min assist demonstrating/verbalizing understanding of hemi-dressing technique. Baseline:  Goal status: IN PROGRESS   LONG TERM GOALS: Target date: 11/10/22  Pt will be able to complete UB dressing with Supervision assist  (to include donning/fastening bra PRN) demonstrating/verbalizing understanding of hemi-dressing technique. Baseline:  Goal status: IN PROGRESS  2.  Pt will be able to incorporate use of RUE at non-dominant level during LB dressing, to be completed at Mod I level. Baseline:  Goal status: IN PROGRESS  3.  Pt will demonstrate ability to pick up light weight object from table top as needed to increase engagement of RUE into ADLs and self-eeding. Baseline:  Goal status: IN PROGRESS  4.  Pt will demonstrate improved UE functional use for ADLs as evidenced by increasing box/ blocks score by 5 blocks with RUE, to be completed in standardized manner. Baseline: Right 2blocks, Left 45 blocks. Modified to complete with RUE.  Pt able to grasp and move across to other side of box with barrier removed.   Goal status: IN PROGRESS  5.  Pt will report decreased pain in dominant RUE interfering with ADLs/IADLs by decreased impairment score on Quick DASH. Baseline: 77% Goal status: IN PROGRESS  6.  Pt will be independent with splint wear and care PRN Baseline: splint needs TBD Goal status: IN PROGRESS  ASSESSMENT:  CLINICAL IMPRESSION: Pt demonstrating improved finger extension and gross grasp during activities.  Pt continues to demonstrate decreased wrist extension and shoulder ROM to engage in self-care tasks of simulated self-feeding.  However pt is able to utilize RUE at greater level than at eval.  Pt will most likely benefit from resting hand splint to continue to address finger extension and wrist extension at rest.   PERFORMANCE DEFICITS: in functional skills including ADLs, IADLs, coordination, sensation, edema, tone, ROM, strength, pain, flexibility, Fine motor control, Gross motor control, balance, body mechanics, endurance, decreased knowledge of use of DME, skin integrity, vision, and UE functional use, cognitive skills including emotional and safety awareness, and psychosocial skills including  coping strategies.   IMPAIRMENTS: are limiting patient from ADLs, IADLs, and rest and sleep.   CO-MORBIDITIES: may have co-morbidities  that affects occupational performance. Patient will  benefit from skilled OT to address above impairments and improve overall function.  MODIFICATION OR ASSISTANCE TO COMPLETE EVALUATION: Min-Moderate modification of tasks or assist with assess necessary to complete an evaluation.  OT OCCUPATIONAL PROFILE AND HISTORY: Detailed assessment: Review of records and additional review of physical, cognitive, psychosocial history related to current functional performance.  CLINICAL DECISION MAKING: Moderate - several treatment options, min-mod task modification necessary  REHAB POTENTIAL: Good  EVALUATION COMPLEXITY: Moderate    PLAN:  OT FREQUENCY: 2x/week  OT DURATION: 8 weeks  PLANNED INTERVENTIONS: self care/ADL training, therapeutic exercise, therapeutic activity, neuromuscular re-education, manual therapy, passive range of motion, balance training, functional mobility training, splinting, paraffin, fluidotherapy, biofeedback, compression bandaging, moist heat, cryotherapy, contrast bath, patient/family education, visual/perceptual remediation/compensation, psychosocial skills training, coping strategies training, and DME and/or AE instructions  RECOMMENDED OTHER SERVICES: NA  CONSULTED AND AGREED WITH PLAN OF CARE: Patient and family member/caregiver  PLAN FOR NEXT SESSION: PROM, massage, and stretching.  Functional grasp and release.  Pursue splinting as schedule allows (possibly 1/17)   Tocarra Gassen, [place], OTR/L 10/09/2022, 2:05 PM

## 2022-10-11 ENCOUNTER — Ambulatory Visit: Payer: Medicare PPO | Admitting: Occupational Therapy

## 2022-10-11 DIAGNOSIS — M6281 Muscle weakness (generalized): Secondary | ICD-10-CM

## 2022-10-11 DIAGNOSIS — I69354 Hemiplegia and hemiparesis following cerebral infarction affecting left non-dominant side: Secondary | ICD-10-CM | POA: Diagnosis not present

## 2022-10-11 DIAGNOSIS — R2681 Unsteadiness on feet: Secondary | ICD-10-CM | POA: Diagnosis not present

## 2022-10-11 DIAGNOSIS — M79601 Pain in right arm: Secondary | ICD-10-CM | POA: Diagnosis not present

## 2022-10-11 DIAGNOSIS — R278 Other lack of coordination: Secondary | ICD-10-CM

## 2022-10-11 DIAGNOSIS — R293 Abnormal posture: Secondary | ICD-10-CM | POA: Diagnosis not present

## 2022-10-11 NOTE — Therapy (Signed)
OUTPATIENT OCCUPATIONAL THERAPY  Treatment Session  Patient Name: Angela Buchanan MRN: [id number] DOB:08/03/1951, 72 y.o., female Today's Date: 10/11/2022  PCP: Glenis Smoker, MD REFERRING PROVIDER: Glenis Smoker, MD  END OF SESSION:  OT End of Session - 10/11/22 1448     Visit Number 8    Number of Visits 17    Date for OT Re-Evaluation 11/10/22    Authorization Type [employer]    OT Start Time 4627    OT Stop Time 1448    OT Time Calculation (min) 43 min                   Past Medical History:  Diagnosis Date   Atypical chest pain 07/19/2021   Bigeminy    no current med.   Breast cancer ([place]) 06/2017   right   Bruises easily    Carotid bruit 11/13/2019   Dental crowns present    History of diverticulitis    Hypertension    states under control with med., has been on med. x 5 yr.   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   Past Surgical History:  Procedure Laterality Date   ABDOMINAL HYSTERECTOMY     partial   BREAST LUMPECTOMY Right 06/22/2017   Malignant   BREAST LUMPECTOMY WITH RADIOACTIVE SEED AND SENTINEL LYMPH NODE BIOPSY Right 06/22/2017   Procedure: RIGHT BREAST LUMPECTOMY WITH RADIOACTIVE SEED AND RIGHT AXILLARY DEEP SENTINEL LYMPH NODE BIOPSY WITH BLUE DYE INJECTION;  Surgeon: Fanny Skates, MD;  Location: [facility];  Service: General;  Laterality: Right;   BREAST LUMPECTOMY WITH RADIOACTIVE SEED LOCALIZATION Left 06/22/2017   Procedure: LEFT BREAST LUMPECTOMY WITH RADIOACTIVE SEED LOCALIZATION;  Surgeon: Fanny Skates, MD;  Location: [facility];  Service: General;  Laterality: Left;   CATARACT EXTRACTION W/ INTRAOCULAR LENS  IMPLANT, BILATERAL  Bilateral    EXCISION OF BREAST BIOPSY Left 06/22/2017   benign   LOOP RECORDER INSERTION N/A 05/16/2022   Procedure: LOOP RECORDER INSERTION;  Surgeon: Vickie Epley, MD;  Location: [facility];  Service: Cardiovascular;  Laterality: N/A;   PERCUTANEOUS PINNING Right 06/22/2022   Procedure: PERCUTANEOUS PINNING OF RIGHT HIP;  Surgeon: Hiram Gash, MD;  Location: [facility];  Service: Orthopedics;  Laterality: Right;   TONSILLECTOMY     age 74   Patient Active Problem List   Diagnosis Date Noted   Malnutrition of moderate degree 06/22/2022   Closed fracture of femur, neck ([place]) 06/21/2022   Urinary frequency 06/21/2022   Hypokalemia 06/21/2022   Heart murmur 06/21/2022   Hypothyroidism 06/21/2022   Dysphagia 05/17/2022   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   CVA (cerebral vascular accident) ([place]) 05/12/2022   Osteoporosis 01/24/2022   Atypical chest pain 07/19/2021   Temporomandibular joint (TMJ) pain 07/16/2020   Referred otalgia of both ears 07/16/2020   PAC (premature atrial contraction) 05/11/2020   PVC (premature ventricular contraction) 05/11/2020   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   Carotid bruit 11/13/2019   Pure hypercholesterolemia 11/13/2019   Malignant neoplasm of upper-outer quadrant of right breast in female, estrogen receptor positive ([place]) 05/22/2017   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   Diplopia 09/17/2014   Dizziness and giddiness 09/17/2014   Essential hypertension 07/22/2014  Chest pain 07/22/2014   Family history of heart disease 07/22/2014    ONSET DATE: CVA 05/12/22  REFERRING DIAG: M79.641 (ICD-10-CM) - Pain in right hand  THERAPY DIAG:  Hemiplegia and hemiparesis following cerebral infarction affecting left non-dominant side (HCC)  Pain in right arm  Other lack of coordination  Muscle weakness (generalized)  Rationale for Evaluation and Treatment: Rehabilitation  SUBJECTIVE:   SUBJECTIVE STATEMENT: Pt reports attempting  to wear built up handle tubing 1.5-2 hrs before reporting discomfort and having to remove splint. Pt accompanied by: self and significant other (Husband, [name])  PERTINENT HISTORY: hypertension, breast cancer, hyperlipidemia, Lt hip fracture secondary to fall 06/2022, CVA 06/2022   PRECAUTIONS: Fall, Rt breast cancer with node removal, osteoporosis  WEIGHT BEARING RESTRICTIONS: No  PAIN:  Are you having pain? Yes: NPRS scale: 5/10 Pain location: R shoulder and upper arm Pain description: aching Aggravating factors: movement to arm Relieving factors: repositioning  FALLS: Has patient fallen in last 6 months? Yes. Number of falls 1 fall, one week after d/c home from SNF  LIVING ENVIRONMENT: Lives with: lives with their spouse Lives in: House/apartment Stairs: Yes: External: 2 steps; on left going up Has following equipment at home: Gilford Rile - 2 wheeled, Hemi walker, Wheelchair (manual), shower chair, and Grab bars  PLOF: Independent and Independent with basic ADLs  PATIENT GOALS: to get use of my R arm again  OBJECTIVE:   HAND DOMINANCE: Right  ADLs: Transfers/ambulation related to ADLs: Mod I with hemi-walker Eating: using L hand for eating, husband is assisting with cutting up food Grooming: Mod I, using L hand UB Dressing: Mod assist LB Dressing: Max assist Toileting: Mod I Bathing: Supervision - Mod I Tub Shower transfers: Herbalist with back and Grab bars  UPPER EXTREMITY ROM:    Active ROM Right eval Left eval  Shoulder flexion 54   Shoulder abduction    Shoulder adduction    Shoulder extension    Shoulder internal rotation    Shoulder external rotation    Elbow flexion 108   Elbow extension -24   Wrist flexion    Wrist extension    Wrist ulnar deviation    Wrist radial deviation    Wrist pronation    Wrist supination    (Blank rows = not tested)  Passive ROM Right eval Left eval  Shoulder flexion 65   Shoulder abduction     Shoulder adduction    Shoulder extension    Shoulder internal rotation    Shoulder external rotation    Elbow flexion    Elbow extension -17   Wrist flexion 46   Wrist extension 27   Wrist ulnar deviation    Wrist radial deviation    Wrist pronation    Wrist supination    (Blank rows = not tested)   UPPER EXTREMITY MMT:   not assessed due to pain with any PROM, AROM    HAND FUNCTION: Loose gross grasp   COORDINATION: Box and Blocks:  Right 2blocks, Left 45 blocks. Modified to complete with RUE.  Pt able to grasp and move across to other side of box with barrier removed.    SENSATION: Mild decrease in R  EDEMA: mild edema in wrist hand, and fingers on RUE  OBSERVATIONS: Pt guarded with movement, hypersensitive to touch and PROM.     TODAY'S TREATMENT:  10/11/21 Box and Blocks: pt able to move 11 blocks in 1 min time limit with LUE, with barrier removed.   Splint:  Wrist: OT providing verbal cues and demonstration for sustained hold in wrist extension for further stretch.  Providing cues and handout for caregiver assisted PROM. Wrist extension: R: PROM 42*, L: AROM 75*   10/09/21 Splint recommendations: Pt asking about custom splint vs pre-fabricated splint.  Pt states that she is worried that she might not be able to tolerate session to make custom splint and asking for suggestions for off the shelf.  OT provided pt and spouse with handout of options while addressing areas of recommendation to ensure appropriate fit.  Pt plans to go look at store and either purchase or pursue custom splinting. Pinch: pt asking when she will be able to feed self with R hand.  Engaged in gross 3 jaw chuck and tip to tip pinch with large jacks.  Pt able to complete with gross grasp when attempting to incorporate 3rd and 4th fingers as well.  OT challenged pt to complete tip pinch and release with min cues to visually attend to UE during task especially with  release.  OT encouraged pt to incorporate RUE into self-feeding whether it be with finger foods, sandwiches, and to attempt gross grasp on utensil with self-feeding.  OT increased challenge to incorporate functional reach and internal rotation to simulate pouring.   10/05/21 NMR: discussed options to treat extensor tone with possibility of NMES if medically appropriate.  Pt does have loop recorder placed and h/o breast cancer so may not be a candidate. Splinting: Discussed splinting options purchase of off the shelf vs custom resting hand splint for continued focus on positioning of hand, especially as she reports waking up in pain and increased flexion in her hand in the mornings upon waking.   Splinting: OT utilized built up handle tubing in conjunction with splinting velcro and strapping to fabricate an splint for increased DIP and PIP extension/support.  Educated on placing wash cloth around to increase circumference, OT providing demonstration and assessing fit. Wrist extension: utilized medium sized ball on table top to facilitate wrist extension.  OT providing demonstration and additional cues for technique as pt compensating with shoulder hike and ulnar rotation.  Discussed household items to attempt this ROM/stretch at home.  Pt demonstrating mild improvements in wrist extension as well as sustained partial finger extension with wrist flexion/extension on ball.    PATIENT EDUCATION: Education details: educated on options for treatment of extensor tone and possibility of splinting recommendations.  Ongoing condition specific education; see above for specifics. Person educated: Patient and Spouse Education method: Explanation, Demonstration, Tactile cues, Verbal cues, and Handouts Education comprehension: verbalized understanding and needs further education  HOME EXERCISE PROGRAM: Access Code: [email or web address] URL: [email or web address] Date: 09/27/2022 Prepared by: [facility]  Exercises - Supine Shoulder Flexion AAROM with Dowel  - 1 x daily - 3 x weekly - 2 sets - 10 reps - Supine Shoulder Protraction with Dowel  - 1 x daily - 3 x weekly - 2 sets - 10 reps - Supine Shoulder Abduction AAROM with Dowel  - 1 x daily - 3 x weekly - 2 sets - 10 reps - Seated Forearm Pronation Supination AROM  - 1 x daily - 7 x weekly - 2 sets - 10 reps - Hand PROM Finger Extension  - 1 x daily - 7 x weekly - 2 sets -  10 reps - Thumb AROM Opposition  - 1 x daily - 7 x weekly - 1 sets - 5 reps - Seated Isolated Finger PIP Flexion AROM  - 1 x daily - 7 x weekly - 1 sets - 5 reps - Seated Finger Composite Flexion Stretch  - 1 x daily - 7 x weekly - 1 sets - 5 reps   GOALS: Goals reviewed with patient? Yes  SHORT TERM GOALS: Target date: 10/13/22  Pt and spouse will be independent with ROM and coordination HEP. Baseline: Goal status: IN PROGRESS  2.  Pt and spouse will verbalize understanding of supine and sitting positioning to decrease edema and pain in RUE. Baseline:  Goal status: IN PROGRESS  3.  Pt will verbalize understanding of task modifications and/or potential AE needs to increase ease, safety, and independence w/ ADLs. Baseline:  Goal status: IN PROGRESS  4.  Pt will be able to complete UB dressing with min assist demonstrating/verbalizing understanding of hemi-dressing technique. Baseline:  Goal status: IN PROGRESS   LONG TERM GOALS: Target date: 11/10/22  Pt will be able to complete UB dressing with Supervision assist (to include donning/fastening bra PRN) demonstrating/verbalizing understanding of hemi-dressing technique. Baseline:  Goal status: IN PROGRESS  2.  Pt will be able to incorporate use of RUE at non-dominant level during LB dressing, to be completed at Mod I level. Baseline:  Goal status: IN PROGRESS  3.  Pt will demonstrate ability to pick up light weight object from table top as needed to increase engagement of  RUE into ADLs and self-eeding. Baseline:  Goal status: IN PROGRESS  4.  Pt will demonstrate improved UE functional use for ADLs as evidenced by increasing box/ blocks score by 5 blocks with RUE, to be completed in standardized manner. Baseline: Right 2blocks, Left 45 blocks. Modified to complete with RUE.  Pt able to grasp and move across to other side of box with barrier removed.   Goal status: IN PROGRESS  5.  Pt will report decreased pain in dominant RUE interfering with ADLs/IADLs by decreased impairment score on Quick DASH. Baseline: 77% Goal status: IN PROGRESS  6.  Pt will be independent with splint wear and care PRN Baseline: splint needs TBD Goal status: IN PROGRESS  ASSESSMENT:  CLINICAL IMPRESSION: Pt demonstrating improved finger extension and gross grasp during activities.  Pt continues to demonstrate decreased wrist extension and shoulder ROM to engage in self-care tasks of simulated self-feeding.  However pt is able to utilize RUE at greater level than at eval.  Pt will most likely benefit from resting hand splint to continue to address finger extension and wrist extension at rest.   PERFORMANCE DEFICITS: in functional skills including ADLs, IADLs, coordination, sensation, edema, tone, ROM, strength, pain, flexibility, Fine motor control, Gross motor control, balance, body mechanics, endurance, decreased knowledge of use of DME, skin integrity, vision, and UE functional use, cognitive skills including emotional and safety awareness, and psychosocial skills including coping strategies.   IMPAIRMENTS: are limiting patient from ADLs, IADLs, and rest and sleep.   CO-MORBIDITIES: may have co-morbidities  that affects occupational performance. Patient will benefit from skilled OT to address above impairments and improve overall function.  MODIFICATION OR ASSISTANCE TO COMPLETE EVALUATION: Min-Moderate modification of tasks or assist with assess necessary to complete an  evaluation.  OT OCCUPATIONAL PROFILE AND HISTORY: Detailed assessment: Review of records and additional review of physical, cognitive, psychosocial history related to current functional performance.  CLINICAL DECISION MAKING: Moderate -  several treatment options, min-mod task modification necessary  REHAB POTENTIAL: Good  EVALUATION COMPLEXITY: Moderate    PLAN:  OT FREQUENCY: 2x/week  OT DURATION: 8 weeks  PLANNED INTERVENTIONS: self care/ADL training, therapeutic exercise, therapeutic activity, neuromuscular re-education, manual therapy, passive range of motion, balance training, functional mobility training, splinting, paraffin, fluidotherapy, biofeedback, compression bandaging, moist heat, cryotherapy, contrast bath, patient/family education, visual/perceptual remediation/compensation, psychosocial skills training, coping strategies training, and DME and/or AE instructions  RECOMMENDED OTHER SERVICES: NA  CONSULTED AND AGREED WITH PLAN OF CARE: Patient and family member/caregiver  PLAN FOR NEXT SESSION: PROM, massage, and stretching.  Functional grasp and release.  Pursue splinting as schedule allows (possibly 1/17)   Teon Hudnall, [place], OTR/L 10/11/2022, 2:50 PM

## 2022-10-16 ENCOUNTER — Ambulatory Visit: Payer: Medicare PPO | Attending: Family Medicine | Admitting: Physical Therapy

## 2022-10-16 ENCOUNTER — Encounter: Payer: Self-pay | Admitting: Physical Therapy

## 2022-10-16 ENCOUNTER — Ambulatory Visit: Payer: Medicare PPO | Admitting: Occupational Therapy

## 2022-10-16 DIAGNOSIS — I69354 Hemiplegia and hemiparesis following cerebral infarction affecting left non-dominant side: Secondary | ICD-10-CM

## 2022-10-16 DIAGNOSIS — R2681 Unsteadiness on feet: Secondary | ICD-10-CM

## 2022-10-16 DIAGNOSIS — M79601 Pain in right arm: Secondary | ICD-10-CM

## 2022-10-16 DIAGNOSIS — R2689 Other abnormalities of gait and mobility: Secondary | ICD-10-CM

## 2022-10-16 DIAGNOSIS — M6281 Muscle weakness (generalized): Secondary | ICD-10-CM | POA: Diagnosis not present

## 2022-10-16 DIAGNOSIS — R278 Other lack of coordination: Secondary | ICD-10-CM

## 2022-10-16 DIAGNOSIS — R293 Abnormal posture: Secondary | ICD-10-CM | POA: Diagnosis not present

## 2022-10-16 NOTE — Therapy (Signed)
OUTPATIENT OCCUPATIONAL THERAPY  Treatment Session  Patient Name: Angela Buchanan MRN: [id number] DOB:24-Feb-1951, 72 y.o., female Today's Date: 10/16/2022  PCP: Glenis Smoker, MD REFERRING PROVIDER: Glenis Smoker, MD  END OF SESSION:  OT End of Session - 10/16/22 1536     Visit Number 9    Number of Visits 17    Date for OT Re-Evaluation 11/10/22    Authorization Type [employer]    OT Start Time 1451    OT Stop Time 1531    OT Time Calculation (min) 40 min                    Past Medical History:  Diagnosis Date   Atypical chest pain 07/19/2021   Bigeminy    no current med.   Breast cancer ([place]) 06/2017   right   Bruises easily    Carotid bruit 11/13/2019   Dental crowns present    History of diverticulitis    Hypertension    states under control with med., has been on med. x 5 yr.   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   Past Surgical History:  Procedure Laterality Date   ABDOMINAL HYSTERECTOMY     partial   BREAST LUMPECTOMY Right 06/22/2017   Malignant   BREAST LUMPECTOMY WITH RADIOACTIVE SEED AND SENTINEL LYMPH NODE BIOPSY Right 06/22/2017   Procedure: RIGHT BREAST LUMPECTOMY WITH RADIOACTIVE SEED AND RIGHT AXILLARY DEEP SENTINEL LYMPH NODE BIOPSY WITH BLUE DYE INJECTION;  Surgeon: Fanny Skates, MD;  Location: [facility];  Service: General;  Laterality: Right;   BREAST LUMPECTOMY WITH RADIOACTIVE SEED LOCALIZATION Left 06/22/2017   Procedure: LEFT BREAST LUMPECTOMY WITH RADIOACTIVE SEED LOCALIZATION;  Surgeon: Fanny Skates, MD;  Location: [facility];  Service: General;  Laterality: Left;   CATARACT EXTRACTION W/ INTRAOCULAR LENS  IMPLANT, BILATERAL  Bilateral    EXCISION OF BREAST BIOPSY Left 06/22/2017   benign   LOOP RECORDER INSERTION N/A 05/16/2022   Procedure: LOOP RECORDER INSERTION;  Surgeon: Vickie Epley, MD;  Location: [facility];  Service: Cardiovascular;  Laterality: N/A;   PERCUTANEOUS PINNING Right 06/22/2022   Procedure: PERCUTANEOUS PINNING OF RIGHT HIP;  Surgeon: Hiram Gash, MD;  Location: [facility];  Service: Orthopedics;  Laterality: Right;   TONSILLECTOMY     age 66   Patient Active Problem List   Diagnosis Date Noted   Malnutrition of moderate degree 06/22/2022   Closed fracture of femur, neck ([place]) 06/21/2022   Urinary frequency 06/21/2022   Hypokalemia 06/21/2022   Heart murmur 06/21/2022   Hypothyroidism 06/21/2022   Dysphagia 05/17/2022   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   CVA (cerebral vascular accident) ([place]) 05/12/2022   Osteoporosis 01/24/2022   Atypical chest pain 07/19/2021   Temporomandibular joint (TMJ) pain 07/16/2020   Referred otalgia of both ears 07/16/2020   PAC (premature atrial contraction) 05/11/2020   PVC (premature ventricular contraction) 05/11/2020   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   Carotid bruit 11/13/2019   Pure hypercholesterolemia 11/13/2019   Malignant neoplasm of upper-outer quadrant of right breast in female, estrogen receptor positive ([place]) 05/22/2017   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   Diplopia 09/17/2014   Dizziness and giddiness 09/17/2014   Essential hypertension  07/22/2014   Chest pain 07/22/2014   Family history of heart disease 07/22/2014    ONSET DATE: CVA 05/12/22  REFERRING DIAG: M79.641 (ICD-10-CM) - Pain in right hand  THERAPY DIAG:  Hemiplegia and hemiparesis following cerebral infarction affecting left non-dominant side (HCC)  Pain in right arm  Other lack of coordination  Muscle weakness (generalized)  Rationale for Evaluation and Treatment: Rehabilitation  SUBJECTIVE:   SUBJECTIVE STATEMENT: Pt stating they went  to purchase a brace, but no medical supply stores had one in stock. Pt accompanied by: self and significant other (Husband, [name])  PERTINENT HISTORY: hypertension, breast cancer, hyperlipidemia, Lt hip fracture secondary to fall 06/2022, CVA 06/2022   PRECAUTIONS: Fall, Rt breast cancer with node removal, osteoporosis  WEIGHT BEARING RESTRICTIONS: No  PAIN:  Are you having pain? Yes: NPRS scale: 5/10 Pain location: R shoulder and upper arm Pain description: aching Aggravating factors: movement to arm Relieving factors: repositioning  FALLS: Has patient fallen in last 6 months? Yes. Number of falls 1 fall, one week after d/c home from SNF  LIVING ENVIRONMENT: Lives with: lives with their spouse Lives in: House/apartment Stairs: Yes: External: 2 steps; on left going up Has following equipment at home: Gilford Rile - 2 wheeled, Hemi walker, Wheelchair (manual), shower chair, and Grab bars  PLOF: Independent and Independent with basic ADLs  PATIENT GOALS: to get use of my R arm again  OBJECTIVE:   HAND DOMINANCE: Right  ADLs: Transfers/ambulation related to ADLs: Mod I with hemi-walker Eating: using L hand for eating, husband is assisting with cutting up food Grooming: Mod I, using L hand UB Dressing: Mod assist LB Dressing: Max assist Toileting: Mod I Bathing: Supervision - Mod I Tub Shower transfers: Herbalist with back and Grab bars  UPPER EXTREMITY ROM:    Active ROM Right eval Left eval  Shoulder flexion 54   Shoulder abduction    Shoulder adduction    Shoulder extension    Shoulder internal rotation    Shoulder external rotation    Elbow flexion 108   Elbow extension -24   Wrist flexion    Wrist extension    Wrist ulnar deviation    Wrist radial deviation    Wrist pronation    Wrist supination    (Blank rows = not tested)  Passive ROM Right eval Left eval  Shoulder flexion 65   Shoulder abduction    Shoulder adduction    Shoulder  extension    Shoulder internal rotation    Shoulder external rotation    Elbow flexion    Elbow extension -17   Wrist flexion 46   Wrist extension 27   Wrist ulnar deviation    Wrist radial deviation    Wrist pronation    Wrist supination    (Blank rows = not tested)   UPPER EXTREMITY MMT:   not assessed due to pain with any PROM, AROM    HAND FUNCTION: Loose gross grasp   COORDINATION: Box and Blocks:  Right 2blocks, Left 45 blocks. Modified to complete with RUE.  Pt able to grasp and move across to other side of box with barrier removed.    SENSATION: Mild decrease in R  EDEMA: mild edema in wrist hand, and fingers on RUE  OBSERVATIONS: Pt guarded with movement, hypersensitive to touch and PROM.     TODAY'S TREATMENT:  10/16/22 Towel glides: towel glides in sitting with focus on shoulder flexion and elbow extension.  Pt requiring hand over hand to complete full ROM to tolerance and to provide assistance to compensate for decreased finger extension.   UB dressing: Pt reports donning t-shirt or lightweight shirt Mod I, but requiring assistance if wearing bulkier shirt.  Spouse assists with pulling up/down torso when bulkier shirt.   Splinting: lengthy discussion about avenues to purchase off the shelf splint as pt still unsure about custom splint to decreased tolerance of stretch and fearful of stiffer material.  OT did trace RUE (if pursuing custom splint) and provided measurements if to purchase (size small) from medical supply store or online.  Pt asking multiple questions and still leaning towards prefabricated, if she can locate one. UE ROM: OT providing passive stretch to 3rd and 4th digits for extension with pt tolerating increased ROM.  Pt reports pain in wrist with wrist extension during finger extension.     10/11/22 Box and Blocks: pt able to move 11 blocks in 1 min time limit with LUE, with barrier removed.  Pt frequently placing  block on ledge and then sliding over to other side of box.  OT provided cues for technique and sustained grasp.  Pt then completed again, moving 11 blocks in 1 min with improved motor control. Splint: reviewed recommendations for resting hand splint with pt reporting plan to go look at options at medical supply store as she continues to report being unsure of custom made splint.  Reviewed importance of either type of splint to provide sustained positioning for improved extension of digits 3 and 4.   Wrist: OT providing verbal cues and demonstration for sustained hold in wrist extension for further stretch. OT providing PROM stretch for wrist extension, pt tolerating stretch for 10 seconds with reports of min pain/tightness.  Pt continues to demonstrate difficulty with positioning and sustained hold, therefore educated pt and spouse on caregiver assisted PROM.  OT providing cues and handout for caregiver assisted PROM. Wrist extension: R: PROM 42*, L: AROM 75*.  Engaged in [place] for increased wrist extension with use of medium therapy ball.  Pt completing wrist extension while rolling ball, requiring min-mod cues for positioning and technique.  Educated on functional positioning with pushing up from lap or arm rests to facilitate wrist extension.     10/09/22 Splint recommendations: Pt asking about custom splint vs pre-fabricated splint.  Pt states that she is worried that she might not be able to tolerate session to make custom splint and asking for suggestions for off the shelf.  OT provided pt and spouse with handout of options while addressing areas of recommendation to ensure appropriate fit.  Pt plans to go look at store and either purchase or pursue custom splinting. Pinch: pt asking when she will be able to feed self with R hand.  Engaged in gross 3 jaw chuck and tip to tip pinch with large jacks.  Pt able to complete with gross grasp when attempting to incorporate 3rd and 4th fingers as well.  OT  challenged pt to complete tip pinch and release with min cues to visually attend to UE during task especially with release.  OT encouraged pt to incorporate RUE into self-feeding whether it be with finger foods, sandwiches, and to attempt gross grasp on utensil with self-feeding.  OT increased challenge to incorporate functional reach and internal rotation to simulate pouring.   PATIENT EDUCATION: Education details: educated on options for treatment of  extensor tone and possibility of splinting recommendations.  Ongoing condition specific education; see above for specifics. Person educated: Patient and Spouse Education method: Explanation, Demonstration, Tactile cues, Verbal cues, and Handouts Education comprehension: verbalized understanding and needs further education  HOME EXERCISE PROGRAM: Access Code: [email or web address] URL: [email or web address] Date: 09/27/2022 Prepared by: [facility]  Exercises - Supine Shoulder Flexion AAROM with Dowel  - 1 x daily - 3 x weekly - 2 sets - 10 reps - Supine Shoulder Protraction with Dowel  - 1 x daily - 3 x weekly - 2 sets - 10 reps - Supine Shoulder Abduction AAROM with Dowel  - 1 x daily - 3 x weekly - 2 sets - 10 reps - Seated Forearm Pronation Supination AROM  - 1 x daily - 7 x weekly - 2 sets - 10 reps - Hand PROM Finger Extension  - 1 x daily - 7 x weekly - 2 sets - 10 reps - Thumb AROM Opposition  - 1 x daily - 7 x weekly - 1 sets - 5 reps - Seated Isolated Finger PIP Flexion AROM  - 1 x daily - 7 x weekly - 1 sets - 5 reps - Seated Finger Composite Flexion Stretch  - 1 x daily - 7 x weekly - 1 sets - 5 reps   GOALS: Goals reviewed with patient? Yes  SHORT TERM GOALS: Target date: 10/13/22  Pt and spouse will be independent with ROM and coordination HEP. Baseline: Goal status: MET - 10/16/22  2.  Pt and spouse will verbalize understanding of supine and sitting positioning to decrease edema and  pain in RUE. Baseline:  Goal status: MET - 10/16/22  3.  Pt will verbalize understanding of task modifications and/or potential AE needs to increase ease, safety, and independence w/ ADLs. Baseline:  Goal status: IN PROGRESS  4.  Pt will be able to complete UB dressing with min assist demonstrating/verbalizing understanding of hemi-dressing technique. Baseline:  Goal status: MET - 10/16/22    LONG TERM GOALS: Target date: 11/10/22  Pt will be able to complete UB dressing with Supervision assist (to include donning/fastening bra PRN) demonstrating/verbalizing understanding of hemi-dressing technique. Baseline:  Goal status: IN PROGRESS  2.  Pt will be able to incorporate use of RUE at non-dominant level during LB dressing, to be completed at Mod I level. Baseline:  Goal status: IN PROGRESS  3.  Pt will demonstrate ability to pick up light weight object from table top as needed to increase engagement of RUE into ADLs and self-eeding. Baseline:  Goal status: IN PROGRESS  4.  Pt will demonstrate improved UE functional use for ADLs as evidenced by increasing box/ blocks score by 5 blocks with RUE, to be completed in standardized manner. Baseline: Right 2blocks, Left 45 blocks. Modified to complete with RUE.  Pt able to grasp and move across to other side of box with barrier removed.   Goal status: IN PROGRESS  5.  Pt will report decreased pain in dominant RUE interfering with ADLs/IADLs by decreased impairment score on Quick DASH. Baseline: 77% Goal status: IN PROGRESS  6.  Pt will be independent with splint wear and care PRN Baseline: splint needs TBD Goal status: IN PROGRESS  ASSESSMENT:  CLINICAL IMPRESSION: Pt continues to be limited by decreased finger extension, wrist extension, and pain in upper arm.  Pt tolerating increased UE ROM this session with focus on full upper body movement while continuing discussion on splinting  options.  Pt still hesitant about custom splint and  wanting to pursue prefabricated splint.  PERFORMANCE DEFICITS: in functional skills including ADLs, IADLs, coordination, sensation, edema, tone, ROM, strength, pain, flexibility, Fine motor control, Gross motor control, balance, body mechanics, endurance, decreased knowledge of use of DME, skin integrity, vision, and UE functional use, cognitive skills including emotional and safety awareness, and psychosocial skills including coping strategies.   IMPAIRMENTS: are limiting patient from ADLs, IADLs, and rest and sleep.   CO-MORBIDITIES: may have co-morbidities  that affects occupational performance. Patient will benefit from skilled OT to address above impairments and improve overall function.  MODIFICATION OR ASSISTANCE TO COMPLETE EVALUATION: Min-Moderate modification of tasks or assist with assess necessary to complete an evaluation.  OT OCCUPATIONAL PROFILE AND HISTORY: Detailed assessment: Review of records and additional review of physical, cognitive, psychosocial history related to current functional performance.  CLINICAL DECISION MAKING: Moderate - several treatment options, min-mod task modification necessary  REHAB POTENTIAL: Good  EVALUATION COMPLEXITY: Moderate    PLAN:  OT FREQUENCY: 2x/week  OT DURATION: 8 weeks  PLANNED INTERVENTIONS: self care/ADL training, therapeutic exercise, therapeutic activity, neuromuscular re-education, manual therapy, passive range of motion, balance training, functional mobility training, splinting, paraffin, fluidotherapy, biofeedback, compression bandaging, moist heat, cryotherapy, contrast bath, patient/family education, visual/perceptual remediation/compensation, psychosocial skills training, coping strategies training, and DME and/or AE instructions  RECOMMENDED OTHER SERVICES: NA  CONSULTED AND AGREED WITH PLAN OF CARE: Patient and family member/caregiver  PLAN FOR NEXT SESSION: PROM, massage, and stretching.  Functional grasp and  release.  Pursue splinting as schedule allows (possibly 1/17)   Claudis Giovanelli, [place], OTR/L 10/16/2022, 3:36 PM

## 2022-10-16 NOTE — Therapy (Signed)
OUTPATIENT PHYSICAL THERAPY TREATMENT NOTE   Patient Name: Angela Buchanan MRN: [id number] DOB:12-03-1950, 72 y.o., female Today's Date: 10/16/2022  PCP:  Sela Hilding, MD   REFERRING PROVIDER:  Sela Hilding, MD    END OF SESSION:   PT End of Session - 10/16/22 1233     Visit Number 8    Date for PT Re-Evaluation 11/22/22    Authorization Type [employer]- visit requested    Authorization - Visit Number 8    Authorization - Number of Visits 12    Progress Note Due on Visit 10    PT Start Time 1229    PT Stop Time 1309    PT Time Calculation (min) 40 min    Activity Tolerance Patient tolerated treatment well    Behavior During Therapy [facility] for tasks assessed/performed                  Past Medical History:  Diagnosis Date   Atypical chest pain 07/19/2021   Bigeminy    no current med.   Breast cancer ([place]) 06/2017   right   Bruises easily    Carotid bruit 11/13/2019   Dental crowns present    History of diverticulitis    Hypertension    states under control with med., has been on med. x 5 yr.   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   Past Surgical History:  Procedure Laterality Date   ABDOMINAL HYSTERECTOMY     partial   BREAST LUMPECTOMY Right 06/22/2017   Malignant   BREAST LUMPECTOMY WITH RADIOACTIVE SEED AND SENTINEL LYMPH NODE BIOPSY Right 06/22/2017   Procedure: RIGHT BREAST LUMPECTOMY WITH RADIOACTIVE SEED AND RIGHT AXILLARY DEEP SENTINEL LYMPH NODE BIOPSY WITH BLUE DYE INJECTION;  Surgeon: Fanny Skates, MD;  Location: [place];  Service: General;  Laterality: Right;   BREAST LUMPECTOMY WITH RADIOACTIVE SEED LOCALIZATION Left 06/22/2017   Procedure: LEFT BREAST LUMPECTOMY WITH  RADIOACTIVE SEED LOCALIZATION;  Surgeon: Fanny Skates, MD;  Location: [place];  Service: General;  Laterality: Left;   CATARACT EXTRACTION W/ INTRAOCULAR LENS  IMPLANT, BILATERAL Bilateral    EXCISION OF BREAST BIOPSY Left 06/22/2017   benign   LOOP RECORDER INSERTION N/A 05/16/2022   Procedure: LOOP RECORDER INSERTION;  Surgeon: Vickie Epley, MD;  Location: [place] [facility];  Service: Cardiovascular;  Laterality: N/A;   PERCUTANEOUS PINNING Right 06/22/2022   Procedure: PERCUTANEOUS PINNING OF RIGHT HIP;  Surgeon: Hiram Gash, MD;  Location: [facility];  Service: Orthopedics;  Laterality: Right;   TONSILLECTOMY     age 19   Patient Active Problem List   Diagnosis Date Noted   Malnutrition of moderate degree 06/22/2022   Closed fracture of femur, neck ([place]) 06/21/2022   Urinary frequency 06/21/2022   Hypokalemia 06/21/2022   Heart murmur 06/21/2022   Hypothyroidism 06/21/2022   Dysphagia 05/17/2022   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   CVA (cerebral vascular accident) ([place]) 05/12/2022   Osteoporosis 01/24/2022   Atypical chest pain 07/19/2021   Temporomandibular joint (TMJ) pain 07/16/2020   Referred otalgia of both ears 07/16/2020   PAC (premature atrial contraction) 05/11/2020   PVC (premature ventricular contraction) 05/11/2020   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   Carotid bruit 11/13/2019  Pure hypercholesterolemia 11/13/2019   Malignant neoplasm of upper-outer quadrant of right breast in female, estrogen receptor positive ([place]) 05/22/2017   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   Diplopia 09/17/2014   Dizziness and giddiness 09/17/2014   Essential hypertension 07/22/2014   Chest pain 07/22/2014   Family history of heart disease 07/22/2014    REFERRING DIAG:  R29.6 (ICD-10-CM) - Repeated falls, closed fracture of Rt hip    THERAPY DIAG:  Muscle weakness (generalized)  Unsteadiness on feet  Other abnormalities of gait and  mobility  Other lack of coordination  Rationale for Evaluation and Treatment Rehabilitation  PERTINENT HISTORY: hypertension, breast cancer, hyperlipidemia, Lt hip fracture secondary to fall 06/2022, CVA 06/2022   PRECAUTIONS:  Fall, Rt breast cancer with node removal, osteoporosis    SUBJECTIVE:                                                                                                                                                                                      SUBJECTIVE STATEMENT: Still looking at canes. I am walking a lot at home. Doing PT for my RTUE.    PAIN:  Are you having pain? Not right now but my right hand and arm can be very sore at times.    OBJECTIVE: (objective measures completed at initial evaluation unless otherwise dated)  DIAGNOSTIC FINDINGS: Rt hip fracture and ORIF     COGNITION: Overall cognitive status: Within functional limits for tasks assessed                         SENSATION: WFL     POSTURE: rounded shoulders, forward head, flexed trunk , and weight shift left   PALPATION: NA   LOWER EXTREMITY ROM: Rt hip limited by 50%, hamstrings limited by 50% bil.    LOWER EXTREMITY MMT:   MMT Right eval Right  09/27/22 Left eval  Hip flexion 3+ 4- 4-  Hip extension       Hip abduction 3-  4  Hip adduction       Hip internal rotation       Hip external rotation       Knee flexion 3- 4- 3+  Knee extension 3+ 4 4-  Ankle dorsiflexion 3+ 4- 4-  Ankle plantarflexion       Ankle inversion       Ankle eversion        (Blank rows = not tested)   FUNCTIONAL TESTS:  5 times sit to stand: not able to due per pt request secondary to Lt shoulder pain with sit to stand.  Max LT UE pain with sit  to stand   Timed up and go (TUG): 1 min, 27 seconds   09/27/22: 5x sit to stand: 29 seconds with use of Lt hand  TUG: 27 seconds    GAIT: Distance walked: 25 feet  Assistive device utilized: Hemi walker on Lt Level of assistance:  SBA Comments: slow mobility, reduced trunk rotation, reduce Rt LE stance time and reduced Rt arm swing     TODAY'S TREATMENT:     10/16/22:  Nustep L1 8 min with PTA present to monitor and discuss progress  LAQ: 3# 10x Lt 6x RT Seated clamshells blue loop 2x10, pt needs foot prop GAIT: 2 Full laps of building with straight cane and CGA. Pt had 1 episode where she leaned heavy RT but did not loose her balance.     09/27/22: Nustep L1 8 min with PT present to monitor and discuss progress  Standing heel raises at barre 2x10 Standing hip abduction at barre 2x10 Sit to stand with use of Lt UE 2x10 Re-evaluation complted   GAIT:   around clinic with standard cane. CGA by PT and cueing for technique  09/18/22: Nustep L1 6 min with PTA present to monitor: Pt was able to do her UE for 2 min before it got too painful. LAQ 2# Bil 10x  Red band ham curls 10x Bil Seated marching: 2# 10x Bil Seated ball squeezes 10x2 Seated clamshells blue loop 2x10: issued green band for home use Sit to stand from chair: 10x Seated toe taps 10x Bil then 5x Bil  GAIT:   Hand held in LTUE: 1 lap of ortho gym  2x alternating between HHA from PTA then 164fet 2x with standard cane and CBA  09/11/22: Nustep L1 6 min with PTA present to monitor LAQ 1.5# Bil 10x  Seated marching:1.5# 10x Bil Seated ball squeezes 10x2 Seated clamshells blue loop 2x7 Sit to stand from chair: 8x Side stepping over low hurdle (2) with LTUE on the counter top. PTA with CGA for safety  GAIT:   Hand held in LTaney 100 feet 2x alternating between HHA from PTA then 1054ft 2x with hemiwalker and SBA   PATIENT EDUCATION:  Education details: Access Code: [email or web address] educated: Patient Education method: Explanation, Demonstration, and Handouts Education comprehension: verbalized understanding and returned demonstration   HOME EXERCISE PROGRAM: Access Code: [email or web address]: [email or web address] Date:  09/27/2022 Prepared by: KeClaiborne BillingsExercises - Seated Long Arc Quad  - 3 x daily - 7 x weekly - 2 sets - 10 reps - 5 hold - Seated March   - 3 x daily - 7 x weekly - 3 sets - 10 reps - Seated Heel Toe Raises   - 3 x daily - 7 x weekly - 2 sets - 10 reps - Seated Isometric Hip Adduction with Ball  - 3 x daily - 7 x weekly - 2 sets - 10 reps - Sit to Stand with Armchair  - 2 x daily - 7 x weekly - 2 sets - 5-10 reps - Standing Hip Abduction with Counter Support  - 1 x daily - 7 x weekly - 2 sets - 10 reps - Heel Raises with Counter Support  - 2 x daily - 7 x weekly - 2 sets - 10 reps  Patient Education - Walking with a Single PoGeneral Lun Modified 4 Point Gait Pattern ASSESSMENT:   CLINICAL IMPRESSION: Pt continues to do well. She has not found a cane she feels comfortable with yet so she continues to  practice at home wit her hemi-walker. Pt was able to increase her walking distance today, only fatigue and a min heavy lean RT with no LOB.   OBJECTIVE IMPAIRMENTS: Abnormal gait, decreased activity tolerance, decreased balance, decreased mobility, difficulty walking, decreased strength, decreased safety awareness, impaired perceived functional ability, impaired flexibility, postural dysfunction, and pain.    ACTIVITY LIMITATIONS: carrying, lifting, sitting, standing, stairs, transfers, hygiene/grooming, and locomotion level   PARTICIPATION LIMITATIONS: meal prep, cleaning, laundry, driving, shopping, and community activity   PERSONAL FACTORS: Age, Past/current experiences, and 1-2 comorbidities: CVA, falls, Rt hip fracture with ORIF  are also affecting patient's functional outcome.    REHAB POTENTIAL: Good   CLINICAL DECISION MAKING: Evolving/moderate complexity   EVALUATION COMPLEXITY: Moderate     GOALS: Goals reviewed with patient? Yes   SHORT TERM GOALS: Target date: 09/06/2022   Be independent in initial HEP Baseline: Goal status: Goal met 08/14/22   2.  Improve LE strength to  perform sit to stand with moderate Lt UE support Baseline:  Goal status: Goal met 08/28/22  3.  Perform TUG in < or = to 60 seconds to reduce falls risk Baseline: 27 seconds (09/27/22) Goal status: MET          LONG TERM GOALS: Target date:  11/22/22   Be independent in advanced HEP Baseline: independent in current HEP and further progress is needed  Goal status: in progress    2.  Perform TUG in < or = to 15 seconds to reduce falls risk Baseline: 27 seconds (09/27/22) Goal status: REVISED    3.  Improve LE strength to perform sit to stand with min to no UE support  Baseline: min to moderate Lt UE support (09/27/22) Goal status: in progress    4.  Ambulate with hemi walker with supervision or no guard due to improved gait Baseline: able to do this Goal status: MET   5.  Demonstrate > or = to 4-/5 to 4/5 Rt hip and knee strength to improve safety and stability  Baseline: see above chart (09/27/22) Goal status: In progress   6. Ambulate with cane for all distances and demonstrate independence due to improved stability  Baseline: using hemiwalker  Goal status: INITIAL     PLAN:   PT FREQUENCY: 1-2x/week   PT DURATION: 8 weeks   PLANNED INTERVENTIONS: Therapeutic exercises, Therapeutic activity, Neuromuscular re-education, Balance training, Gait training, Patient/Family education, Self Care, Joint mobilization, Stair training, Dry Needling, Electrical stimulation, Cryotherapy, Moist heat, Taping, Manual therapy, and Re-evaluation   PLAN FOR NEXT SESSION: Continue to work on gait , strength and balance   Myrene Galas, PTA 10/16/22 2:18 PM

## 2022-10-18 ENCOUNTER — Ambulatory Visit: Payer: Medicare PPO | Admitting: Occupational Therapy

## 2022-10-18 DIAGNOSIS — I69354 Hemiplegia and hemiparesis following cerebral infarction affecting left non-dominant side: Secondary | ICD-10-CM

## 2022-10-18 DIAGNOSIS — R278 Other lack of coordination: Secondary | ICD-10-CM | POA: Diagnosis not present

## 2022-10-18 DIAGNOSIS — M6281 Muscle weakness (generalized): Secondary | ICD-10-CM

## 2022-10-18 DIAGNOSIS — R2681 Unsteadiness on feet: Secondary | ICD-10-CM | POA: Diagnosis not present

## 2022-10-18 DIAGNOSIS — R293 Abnormal posture: Secondary | ICD-10-CM | POA: Diagnosis not present

## 2022-10-18 DIAGNOSIS — M79601 Pain in right arm: Secondary | ICD-10-CM

## 2022-10-18 NOTE — Therapy (Signed)
OUTPATIENT OCCUPATIONAL THERAPY  Treatment Session  Patient Name: EMBERLYN BURLISON MRN: [id number] DOB:04/03/1951, 72 y.o., female Today's Date: 10/18/2022  PCP: Glenis Smoker, MD REFERRING PROVIDER: Glenis Smoker, MD  Occupational Therapy Progress Note  Dates of Reporting Period: 09/13/22 to 10/18/22  Objective Measurements: Pt is demonstrating 10* improvement in PROM of shoulder and wrist flexion and 23 wrist extension.  Measurements were not taken of digits initially, however improvements in extension is emerging.  Plan: Continue to focus on PROM with flexion/extension of digits, wrist, elbow, and shoulder.  Pt would benefit from splinting for finger extension, however having difficulty obtaining prefab resting hand splint and pt concerned about too much pressure if custom made.   END OF SESSION:  OT End of Session - 10/18/22 1405     Visit Number 10    Number of Visits 17    Date for OT Re-Evaluation 11/10/22    Authorization Type [employer]    OT Start Time 1400    OT Stop Time 1445    OT Time Calculation (min) 45 min                     Past Medical History:  Diagnosis Date   Atypical chest pain 07/19/2021   Bigeminy    no current med.   Breast cancer ([place]) 06/2017   right   Bruises easily    Carotid bruit 11/13/2019   Dental crowns present    History of diverticulitis    Hypertension    states under control with med., has been on med. x 5 yr.   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   Past Surgical History:  Procedure Laterality Date   ABDOMINAL HYSTERECTOMY     partial   BREAST LUMPECTOMY Right 06/22/2017   Malignant   BREAST LUMPECTOMY WITH RADIOACTIVE SEED AND SENTINEL LYMPH NODE  BIOPSY Right 06/22/2017   Procedure: RIGHT BREAST LUMPECTOMY WITH RADIOACTIVE SEED AND RIGHT AXILLARY DEEP SENTINEL LYMPH NODE BIOPSY WITH BLUE DYE INJECTION;  Surgeon: Fanny Skates, MD;  Location: [facility];  Service: General;  Laterality: Right;   BREAST LUMPECTOMY WITH RADIOACTIVE SEED LOCALIZATION Left 06/22/2017   Procedure: LEFT BREAST LUMPECTOMY WITH RADIOACTIVE SEED LOCALIZATION;  Surgeon: Fanny Skates, MD;  Location: [facility];  Service: General;  Laterality: Left;   CATARACT EXTRACTION W/ INTRAOCULAR LENS  IMPLANT, BILATERAL Bilateral    EXCISION OF BREAST BIOPSY Left 06/22/2017   benign   LOOP RECORDER INSERTION N/A 05/16/2022   Procedure: LOOP RECORDER INSERTION;  Surgeon: Vickie Epley, MD;  Location: [facility];  Service: Cardiovascular;  Laterality: N/A;   PERCUTANEOUS PINNING Right 06/22/2022   Procedure: PERCUTANEOUS PINNING OF RIGHT HIP;  Surgeon: Hiram Gash, MD;  Location: [facility];  Service: Orthopedics;  Laterality: Right;   TONSILLECTOMY     age 57   Patient Active Problem List   Diagnosis Date Noted   Malnutrition of moderate degree 06/22/2022   Closed fracture of femur, neck ([place]) 06/21/2022   Urinary frequency 06/21/2022   Hypokalemia 06/21/2022   Heart murmur 06/21/2022   Hypothyroidism 06/21/2022   Dysphagia 05/17/2022   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   CVA (cerebral vascular accident) ([place]) 05/12/2022   Osteoporosis  01/24/2022   Atypical chest pain 07/19/2021   Temporomandibular joint (TMJ) pain 07/16/2020   Referred otalgia of both ears 07/16/2020   PAC (premature atrial contraction) 05/11/2020   PVC (premature ventricular contraction) 05/11/2020   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   Carotid bruit 11/13/2019   Pure hypercholesterolemia 11/13/2019   Malignant neoplasm of upper-outer quadrant of right breast in female, estrogen receptor positive ([place]) 05/22/2017   Monocular esotropia of right eye  01/25/2015   Diplopia 09/17/2014   Dizziness and giddiness 09/17/2014   Essential hypertension 07/22/2014   Chest pain 07/22/2014   Family history of heart disease 07/22/2014    ONSET DATE: CVA 05/12/22  REFERRING DIAG: M79.641 (ICD-10-CM) - Pain in right hand  THERAPY DIAG:  Muscle weakness (generalized)  Other lack of coordination  Hemiplegia and hemiparesis following cerebral infarction affecting left non-dominant side (HCC)  Pain in right arm  Rationale for Evaluation and Treatment: Rehabilitation  SUBJECTIVE:   SUBJECTIVE STATEMENT: Pt stating they went to purchase a brace, but no medical supply stores had one in stock. Pt accompanied by: self and significant other (Husband, [name])  PERTINENT HISTORY: hypertension, breast cancer, hyperlipidemia, Lt hip fracture secondary to fall 06/2022, CVA 06/2022   PRECAUTIONS: Fall, Rt breast cancer with node removal, osteoporosis  WEIGHT BEARING RESTRICTIONS: No  PAIN:  Are you having pain? Yes: NPRS scale: 9/10 Pain location: R shoulder and upper arm Pain description: aching Aggravating factors: movement to arm Relieving factors: repositioning, pain meds  FALLS: Has patient fallen in last 6 months? Yes. Number of falls 1 fall, one week after d/c home from SNF  LIVING ENVIRONMENT: Lives with: lives with their spouse Lives in: House/apartment Stairs: Yes: External: 2 steps; on left going up Has following equipment at home: Gilford Rile - 2 wheeled, Hemi walker, Wheelchair (manual), shower chair, and Grab bars  PLOF: Independent and Independent with basic ADLs  PATIENT GOALS: to get use of my R arm again  OBJECTIVE:   HAND DOMINANCE: Right  ADLs: Transfers/ambulation related to ADLs: Mod I with hemi-walker Eating: using L hand for eating, husband is assisting with cutting up food Grooming: Mod I, using L hand UB Dressing: Mod assist LB Dressing: Max assist Toileting: Mod I Bathing: Supervision - Mod I Tub Shower  transfers: Min assist Equipment: Shower seat with back and Grab bars  UPPER EXTREMITY ROM:    Active ROM Right eval Left eval Right 10/18/22   Shoulder flexion 54    Shoulder abduction     Shoulder adduction     Shoulder extension     Shoulder internal rotation     Shoulder external rotation     Elbow flexion 108    Elbow extension -24    Wrist flexion     Wrist extension     Wrist ulnar deviation     Wrist radial deviation     Wrist pronation     Wrist supination     (Blank rows = not tested)  Passive ROM Right eval Left eval Right 10/18/22  Shoulder flexion 65  76  Shoulder abduction     Shoulder adduction     Shoulder extension     Shoulder internal rotation     Shoulder external rotation     Elbow flexion     Elbow extension -17  -15  Wrist flexion 46  56  Wrist extension 27  50  Wrist ulnar deviation     Wrist radial deviation     Wrist pronation  Wrist supination     (Blank rows = not tested)  Passive ROM Right 10/18/22  Thumb MCP (0-60)   Thumb IP (0-80)   Thumb Radial abd/add (0-55)   Thumb Palmar abd/add (0-45)   Thumb Opposition to Small Finger   Index MCP (0-90)   Index PIP (0-100)   Index DIP (0-70)    Long MCP (0-90)    Long PIP (0-100) -30   Long DIP (0-70) -20  Ring MCP (0-90)    Ring PIP (0-100) -25   Ring DIP (0-70) -15   Little MCP (0-90)    Little PIP (0-100)    Little DIP (0-70)    (Blank rows = not tested)   UPPER EXTREMITY MMT:   not assessed due to pain with any PROM, AROM    HAND FUNCTION: Loose gross grasp   COORDINATION: Box and Blocks:  Right 2blocks, Left 45 blocks. Modified to complete with RUE.  Pt able to grasp and move across to other side of box with barrier removed.    SENSATION: Mild decrease in R  EDEMA: mild edema in wrist hand, and fingers on RUE  OBSERVATIONS: Pt guarded with movement, hypersensitive to touch and PROM.     TODAY'S TREATMENT:                                   10/18/22 Moist  heat: applied moist heat prior to engaging in PROM to allow for increased ROM.  Engaged in discussion regarding current issue impacting ordering/purchasing a prefabricated splint.   PROM: OT positioned R forearm in supination with wrist in neutral while providing stretch at long and ring finger in isolation into extension.  OT complete composite finger flexion with cues for technique while reiterating completing flexion in addition to extension with each stretch into extension.  Pt tolerating increased extension and flexion this session.   AROM: OT directed pt in attempts at making a full fist, pt with demonstrating moderate flexion, but unable to complete full/tight grasp. Wrist extension: Engaged in [place] for increased wrist extension with use of medium therapy ball.  Pt completing wrist extension while rolling ball, requiring min cues for positioning and technique.  Reiterated recommendation for pt's spouse to complete PROM at wrist. HEP: OT reviewed previously administered UE HEP, reiterating importance of shoulder flexion and abduction exercises.  OT educating on use of LUE as gross assist in unable to complete with dowel as initially encouraged.   10/16/22 Towel glides: towel glides in sitting with focus on shoulder flexion and elbow extension.  Pt requiring hand over hand to complete full ROM to tolerance and to provide assistance to compensate for decreased finger extension.   UB dressing: Pt reports donning t-shirt or lightweight shirt Mod I, but requiring assistance if wearing bulkier shirt.  Spouse assists with pulling up/down torso when bulkier shirt.   Splinting: lengthy discussion about avenues to purchase off the shelf splint as pt still unsure about custom splint to decreased tolerance of stretch and fearful of stiffer material.  OT did trace RUE (if pursuing custom splint) and provided measurements if to purchase (size small) from medical supply store or online.  Pt asking multiple  questions and still leaning towards prefabricated, if she can locate one. UE ROM: OT providing passive stretch to 3rd and 4th digits for extension with pt tolerating increased ROM.  Pt reports pain in wrist with wrist extension during finger extension.  10/11/22 Box and Blocks: pt able to move 11 blocks in 1 min time limit with LUE, with barrier removed.  Pt frequently placing block on ledge and then sliding over to other side of box.  OT provided cues for technique and sustained grasp.  Pt then completed again, moving 11 blocks in 1 min with improved motor control. Splint: reviewed recommendations for resting hand splint with pt reporting plan to go look at options at medical supply store as she continues to report being unsure of custom made splint.  Reviewed importance of either type of splint to provide sustained positioning for improved extension of digits 3 and 4.   Wrist: OT providing verbal cues and demonstration for sustained hold in wrist extension for further stretch. OT providing PROM stretch for wrist extension, pt tolerating stretch for 10 seconds with reports of min pain/tightness.  Pt continues to demonstrate difficulty with positioning and sustained hold, therefore educated pt and spouse on caregiver assisted PROM.  OT providing cues and handout for caregiver assisted PROM. Wrist extension: R: PROM 42*, L: AROM 75*.  Engaged in [place] for increased wrist extension with use of medium therapy ball.  Pt completing wrist extension while rolling ball, requiring min-mod cues for positioning and technique.  Educated on functional positioning with pushing up from lap or arm rests to facilitate wrist extension.    PATIENT EDUCATION: Education details: educated on options for treatment of extensor tone and possibility of splinting recommendations.  Ongoing condition specific education; see above for specifics. Person educated: Patient and Spouse Education method: Explanation, Demonstration,  Tactile cues, Verbal cues, and Handouts Education comprehension: verbalized understanding and needs further education  HOME EXERCISE PROGRAM: Access Code: [email or web address] URL: [email or web address] Date: 09/27/2022 Prepared by: [facility]  Exercises - Supine Shoulder Flexion AAROM with Dowel  - 1 x daily - 3 x weekly - 2 sets - 10 reps - Supine Shoulder Protraction with Dowel  - 1 x daily - 3 x weekly - 2 sets - 10 reps - Supine Shoulder Abduction AAROM with Dowel  - 1 x daily - 3 x weekly - 2 sets - 10 reps - Seated Forearm Pronation Supination AROM  - 1 x daily - 7 x weekly - 2 sets - 10 reps - Hand PROM Finger Extension  - 1 x daily - 7 x weekly - 2 sets - 10 reps - Thumb AROM Opposition  - 1 x daily - 7 x weekly - 1 sets - 5 reps - Seated Isolated Finger PIP Flexion AROM  - 1 x daily - 7 x weekly - 1 sets - 5 reps - Seated Finger Composite Flexion Stretch  - 1 x daily - 7 x weekly - 1 sets - 5 reps   GOALS: Goals reviewed with patient? Yes  SHORT TERM GOALS: Target date: 10/13/22  Pt and spouse will be independent with ROM and coordination HEP. Baseline: Goal status: MET - 10/16/22  2.  Pt and spouse will verbalize understanding of supine and sitting positioning to decrease edema and pain in RUE. Baseline:  Goal status: MET - 10/16/22  3.  Pt will verbalize understanding of task modifications and/or potential AE needs to increase ease, safety, and independence w/ ADLs. Baseline:  Goal status: IN PROGRESS  4.  Pt will be able to complete UB dressing with min assist demonstrating/verbalizing understanding of hemi-dressing technique. Baseline:  Goal status: MET - 10/16/22    LONG TERM GOALS: Target date: 11/10/22  Pt will be able to complete UB dressing with Supervision assist (to include donning/fastening bra PRN) demonstrating/verbalizing understanding of hemi-dressing technique. Baseline:  Goal status: IN PROGRESS  2.  Pt will  be able to incorporate use of RUE at non-dominant level during LB dressing, to be completed at Mod I level. Baseline:  Goal status: IN PROGRESS  3.  Pt will demonstrate ability to pick up light weight object from table top as needed to increase engagement of RUE into ADLs and self-eeding. Baseline:  Goal status: IN PROGRESS  4.  Pt will demonstrate improved UE functional use for ADLs as evidenced by increasing box/ blocks score by 5 blocks with RUE, to be completed in standardized manner. Baseline: Right 2blocks, Left 45 blocks. Modified to complete with RUE.  Pt able to grasp and move across to other side of box with barrier removed.   Goal status: IN PROGRESS  5.  Pt will report decreased pain in dominant RUE interfering with ADLs/IADLs by decreased impairment score on Quick DASH. Baseline: 77% Goal status: IN PROGRESS  6.  Pt will be independent with splint wear and care PRN Baseline: splint needs TBD Goal status: IN PROGRESS  ASSESSMENT:  CLINICAL IMPRESSION: Pt continues to be limited by decreased shoulder flexion, elbow flexion/extension, and finger extension.  Pt continues to report pain with PROM and onset of pain in upper arm when attempting exercises.  Encouraged stretching when warm, even after shower if possible, to decrease pain.  Pt continues to be focused on hand and finger movements, however responsive to importance of resuming shoulder exercises.  Will benefit from shoulder and full UE exercises at next session.  PERFORMANCE DEFICITS: in functional skills including ADLs, IADLs, coordination, sensation, edema, tone, ROM, strength, pain, flexibility, Fine motor control, Gross motor control, balance, body mechanics, endurance, decreased knowledge of use of DME, skin integrity, vision, and UE functional use, cognitive skills including emotional and safety awareness, and psychosocial skills including coping strategies.   IMPAIRMENTS: are limiting patient from ADLs, IADLs, and  rest and sleep.   CO-MORBIDITIES: may have co-morbidities  that affects occupational performance. Patient will benefit from skilled OT to address above impairments and improve overall function.  MODIFICATION OR ASSISTANCE TO COMPLETE EVALUATION: Min-Moderate modification of tasks or assist with assess necessary to complete an evaluation.  OT OCCUPATIONAL PROFILE AND HISTORY: Detailed assessment: Review of records and additional review of physical, cognitive, psychosocial history related to current functional performance.  CLINICAL DECISION MAKING: Moderate - several treatment options, min-mod task modification necessary  REHAB POTENTIAL: Good  EVALUATION COMPLEXITY: Moderate    PLAN:  OT FREQUENCY: 2x/week  OT DURATION: 8 weeks  PLANNED INTERVENTIONS: self care/ADL training, therapeutic exercise, therapeutic activity, neuromuscular re-education, manual therapy, passive range of motion, balance training, functional mobility training, splinting, paraffin, fluidotherapy, biofeedback, compression bandaging, moist heat, cryotherapy, contrast bath, patient/family education, visual/perceptual remediation/compensation, psychosocial skills training, coping strategies training, and DME and/or AE instructions  RECOMMENDED OTHER SERVICES: NA  CONSULTED AND AGREED WITH PLAN OF CARE: Patient and family member/caregiver  PLAN FOR NEXT SESSION: AAROM or self-ROM to include shoulder, elbow, forearm at next session.  Continue to focus on PROM, massage, and stretching.  Functional grasp and release.     Simonne Come, OTR/L 10/18/2022, 2:05 PM

## 2022-10-20 DIAGNOSIS — I639 Cerebral infarction, unspecified: Secondary | ICD-10-CM | POA: Diagnosis not present

## 2022-10-23 ENCOUNTER — Ambulatory Visit: Payer: Medicare PPO | Admitting: Occupational Therapy

## 2022-10-23 ENCOUNTER — Encounter: Payer: Self-pay | Admitting: Physical Therapy

## 2022-10-23 ENCOUNTER — Ambulatory Visit: Payer: Medicare PPO | Admitting: Physical Therapy

## 2022-10-23 ENCOUNTER — Telehealth: Payer: Self-pay | Admitting: Occupational Therapy

## 2022-10-23 DIAGNOSIS — R2689 Other abnormalities of gait and mobility: Secondary | ICD-10-CM | POA: Diagnosis not present

## 2022-10-23 DIAGNOSIS — M79601 Pain in right arm: Secondary | ICD-10-CM

## 2022-10-23 DIAGNOSIS — I69354 Hemiplegia and hemiparesis following cerebral infarction affecting left non-dominant side: Secondary | ICD-10-CM | POA: Diagnosis not present

## 2022-10-23 DIAGNOSIS — R2681 Unsteadiness on feet: Secondary | ICD-10-CM | POA: Diagnosis not present

## 2022-10-23 DIAGNOSIS — R278 Other lack of coordination: Secondary | ICD-10-CM | POA: Diagnosis not present

## 2022-10-23 DIAGNOSIS — M6281 Muscle weakness (generalized): Secondary | ICD-10-CM | POA: Diagnosis not present

## 2022-10-23 DIAGNOSIS — R293 Abnormal posture: Secondary | ICD-10-CM | POA: Diagnosis not present

## 2022-10-23 NOTE — Telephone Encounter (Signed)
Telephone note entered in error.

## 2022-10-23 NOTE — Therapy (Signed)
OUTPATIENT OCCUPATIONAL THERAPY  Treatment Session  Patient Name: Angela Buchanan MRN: [id number] DOB:[birth year], 72 y.o., female Today's Date: 10/23/2022  PCP: Glenis Smoker, MD REFERRING PROVIDER: Glenis Smoker, MD       END OF SESSION:  OT End of Session - 10/23/22 1602     Visit Number 11    Number of Visits 17    Date for OT Re-Evaluation 11/10/22    Authorization Type [employer]    OT Start Time 1448    OT Stop Time 1530    OT Time Calculation (min) 42 min                      Past Medical History:  Diagnosis Date   Atypical chest pain 07/19/2021   Bigeminy    no current med.   Breast cancer ([place]) 06/2017   right   Bruises easily    Carotid bruit 11/13/2019   Dental crowns present    History of diverticulitis    Hypertension    states under control with med., has been on med. x 5 yr.   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   Past Surgical History:  Procedure Laterality Date   ABDOMINAL HYSTERECTOMY     partial   BREAST LUMPECTOMY Right 06/22/2017   Malignant   BREAST LUMPECTOMY WITH RADIOACTIVE SEED AND SENTINEL LYMPH NODE BIOPSY Right 06/22/2017   Procedure: RIGHT BREAST LUMPECTOMY WITH RADIOACTIVE SEED AND RIGHT AXILLARY DEEP SENTINEL LYMPH NODE BIOPSY WITH BLUE DYE INJECTION;  Surgeon: Fanny Skates, MD;  Location: [facility];  Service: General;  Laterality: Right;   BREAST LUMPECTOMY WITH RADIOACTIVE SEED LOCALIZATION Left 06/22/2017   Procedure: LEFT BREAST LUMPECTOMY WITH RADIOACTIVE SEED LOCALIZATION;  Surgeon: Fanny Skates, MD;  Location: [facility];  Service: General;  Laterality: Left;   CATARACT EXTRACTION W/ INTRAOCULAR LENS   IMPLANT, BILATERAL Bilateral    EXCISION OF BREAST BIOPSY Left 06/22/2017   benign   LOOP RECORDER INSERTION N/A 05/16/2022   Procedure: LOOP RECORDER INSERTION;  Surgeon: Vickie Epley, MD;  Location: [facility];  Service: Cardiovascular;  Laterality: N/A;   PERCUTANEOUS PINNING Right 06/22/2022   Procedure: PERCUTANEOUS PINNING OF RIGHT HIP;  Surgeon: Hiram Gash, MD;  Location: [facility];  Service: Orthopedics;  Laterality: Right;   TONSILLECTOMY     age 35   Patient Active Problem List   Diagnosis Date Noted   Malnutrition of moderate degree 06/22/2022   Closed fracture of femur, neck ([place]) 06/21/2022   Urinary frequency 06/21/2022   Hypokalemia 06/21/2022   Heart murmur 06/21/2022   Hypothyroidism 06/21/2022   Dysphagia 05/17/2022   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   CVA (cerebral vascular accident) ([place]) 05/12/2022   Osteoporosis 01/24/2022   Atypical chest pain 07/19/2021   Temporomandibular joint (TMJ) pain 07/16/2020   Referred otalgia of both ears 07/16/2020   PAC (premature atrial contraction) 05/11/2020   PVC (premature ventricular contraction) 05/11/2020   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   Carotid bruit 11/13/2019   Pure hypercholesterolemia 11/13/2019   Malignant neoplasm of upper-outer quadrant of right breast in female, estrogen receptor positive ([place]) 05/22/2017   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   Diplopia 09/17/2014   Dizziness  and giddiness 09/17/2014   Essential hypertension 07/22/2014   Chest pain 07/22/2014   Family history of heart disease 07/22/2014    ONSET DATE: CVA 05/12/22  REFERRING DIAG: M79.641 (ICD-10-CM) - Pain in right hand  THERAPY DIAG:  Hemiplegia and hemiparesis following cerebral infarction affecting left non-dominant side (HCC)  Pain in right arm  Unsteadiness on feet  Other lack of coordination  Muscle weakness (generalized)  Rationale for Evaluation and Treatment: Rehabilitation  SUBJECTIVE:    SUBJECTIVE STATEMENT: Pt's spouse asking about progress towards ordering brace. Pt accompanied by: self and significant other (Husband, [name])  PERTINENT HISTORY: hypertension, breast cancer, hyperlipidemia, Lt hip fracture secondary to fall 06/2022, CVA 06/2022   PRECAUTIONS: Fall, Rt breast cancer with node removal, osteoporosis  WEIGHT BEARING RESTRICTIONS: No  PAIN:  Are you having pain? Yes: NPRS scale: 6/10 Pain location: R shoulder and upper arm Pain description: aching Aggravating factors: movement to arm Relieving factors: repositioning, pain meds  FALLS: Has patient fallen in last 6 months? Yes. Number of falls 1 fall, one week after d/c home from SNF  LIVING ENVIRONMENT: Lives with: lives with their spouse Lives in: House/apartment Stairs: Yes: External: 2 steps; on left going up Has following equipment at home: Gilford Rile - 2 wheeled, Hemi walker, Wheelchair (manual), shower chair, and Grab bars  PLOF: Independent and Independent with basic ADLs  PATIENT GOALS: to get use of my R arm again  OBJECTIVE:   HAND DOMINANCE: Right  ADLs: Transfers/ambulation related to ADLs: Mod I with hemi-walker Eating: using L hand for eating, husband is assisting with cutting up food Grooming: Mod I, using L hand UB Dressing: Mod assist LB Dressing: Max assist Toileting: Mod I Bathing: Supervision - Mod I Tub Shower transfers: Min assist Equipment: Shower seat with back and Grab bars  UPPER EXTREMITY ROM:    Active ROM Right eval Left eval Right 10/18/22   Shoulder flexion 54    Shoulder abduction     Shoulder adduction     Shoulder extension     Shoulder internal rotation     Shoulder external rotation     Elbow flexion 108    Elbow extension -24    Wrist flexion     Wrist extension     Wrist ulnar deviation     Wrist radial deviation     Wrist pronation     Wrist supination     (Blank rows = not tested)  Passive ROM Right eval Left eval Right 10/18/22   Shoulder flexion 65  76  Shoulder abduction     Shoulder adduction     Shoulder extension     Shoulder internal rotation     Shoulder external rotation     Elbow flexion     Elbow extension -17  -15  Wrist flexion 46  56  Wrist extension 27  50  Wrist ulnar deviation     Wrist radial deviation     Wrist pronation     Wrist supination     (Blank rows = not tested)  Passive ROM Right 10/18/22  Thumb MCP (0-60)   Thumb IP (0-80)   Thumb Radial abd/add (0-55)   Thumb Palmar abd/add (0-45)   Thumb Opposition to Small Finger   Index MCP (0-90)   Index PIP (0-100)   Index DIP (0-70)    Long MCP (0-90)    Long PIP (0-100) -30   Long DIP (0-70) -20  Ring MCP (0-90)    Ring PIP (  0-100) -25   Ring DIP (0-70) -15   Little MCP (0-90)    Little PIP (0-100)    Little DIP (0-70)    (Blank rows = not tested)   UPPER EXTREMITY MMT:   not assessed due to pain with any PROM, AROM    HAND FUNCTION: Loose gross grasp   COORDINATION: Box and Blocks:  Right 2blocks, Left 45 blocks. Modified to complete with RUE.  Pt able to grasp and move across to other side of box with barrier removed.    SENSATION: Mild decrease in R  EDEMA: mild edema in wrist hand, and fingers on RUE  OBSERVATIONS: Pt guarded with movement, hypersensitive to touch and PROM.     TODAY'S TREATMENT:                                   10/23/22 UE exercise: Chest press and shoulder flexion with 1# dowel x10.  OT providing min cues and tactile cues to address elbow extension during overhead reach.  Engaged in horizontal abduction with 1# dowel x10 with focus on ROM and stretch due to tightness in pecs.  OT facilitated abduction exercise with "chicken wing" with focus on pt holding position in abduction for 5-10 seconds for additional stretch.  Fading from PROM to [facility].  Engaged in [place] chest press with focus on quality of movement and controlled descent.   Transitional movements: OT educated on bed mobility  as pt reporting difficulty when attempting to sit up from supine.  OT demonstrated and educated on long rolling and pushing up with LUE while advancing LB over edge of mat.  Pt will continue to benefit from practice of technique. Finger/wrist extension: OT provided multimodal cues for caregiver assisted PROM providing initial demonstration to verbal cues while spouse demonstrated technique.  Therapist provided additional written cues for technique to ensure proper positioning and technique.  Pt demonstrating PROM finger extension followed by flexion with OT providing cues to ensure flexion as well as extension during HEP.   10/18/22 Moist heat: applied moist heat prior to engaging in PROM to allow for increased ROM.  Engaged in discussion regarding current issue impacting ordering/purchasing a prefabricated splint.   PROM: OT positioned R forearm in supination with wrist in neutral while providing stretch at long and ring finger in isolation into extension.  OT complete composite finger flexion with cues for technique while reiterating completing flexion in addition to extension with each stretch into extension.  Pt tolerating increased extension and flexion this session.   AROM: OT directed pt in attempts at making a full fist, pt with demonstrating moderate flexion, but unable to complete full/tight grasp. Wrist extension: Engaged in [place] for increased wrist extension with use of medium therapy ball.  Pt completing wrist extension while rolling ball, requiring min cues for positioning and technique.  Reiterated recommendation for pt's spouse to complete PROM at wrist. HEP: OT reviewed previously administered UE HEP, reiterating importance of shoulder flexion and abduction exercises.  OT educating on use of LUE as gross assist in unable to complete with dowel as initially encouraged.   10/16/22 Towel glides: towel glides in sitting with focus on shoulder flexion and elbow extension.  Pt requiring hand  over hand to complete full ROM to tolerance and to provide assistance to compensate for decreased finger extension.   UB dressing: Pt reports donning t-shirt or lightweight shirt Mod I, but requiring assistance  if wearing bulkier shirt.  Spouse assists with pulling up/down torso when bulkier shirt.   Splinting: lengthy discussion about avenues to purchase off the shelf splint as pt still unsure about custom splint to decreased tolerance of stretch and fearful of stiffer material.  OT did trace RUE (if pursuing custom splint) and provided measurements if to purchase (size small) from medical supply store or online.  Pt asking multiple questions and still leaning towards prefabricated, if she can locate one. UE ROM: OT providing passive stretch to 3rd and 4th digits for extension with pt tolerating increased ROM.  Pt reports pain in wrist with wrist extension during finger extension.     PATIENT EDUCATION: Education details: educated on options for treatment of extensor tone and possibility of splinting recommendations.  Ongoing condition specific education; see above for specifics. Person educated: Patient and Spouse Education method: Explanation, Demonstration, Tactile cues, Verbal cues, and Handouts Education comprehension: verbalized understanding and needs further education  HOME EXERCISE PROGRAM: Access Code: [email or web address] URL: [email or web address] Date: 10/23/2022 Prepared by: [facility]  Exercises - Supine Shoulder Flexion AAROM with Dowel  - 1 x daily - 3 x weekly - 2 sets - 10 reps - Supine Shoulder Protraction with Dowel  - 1 x daily - 3 x weekly - 2 sets - 10 reps - Supine Shoulder Abduction AAROM with Dowel  - 1 x daily - 3 x weekly - 2 sets - 10 reps - Standing Single Bent Arm Shoulder Abduction   - 1 x daily - 3 x weekly - 2 sets - 10 reps - 5-10 sec hold - Hooklying Single Arm Chest Press  - 1 x daily - 3 x weekly - 2 sets - 10  reps - Seated Forearm Pronation Supination AROM  - 1 x daily - 7 x weekly - 2 sets - 10 reps - Hand PROM Finger Extension  - 1 x daily - 7 x weekly - 2 sets - 10 reps - Thumb AROM Opposition  - 1 x daily - 7 x weekly - 1 sets - 5 reps - Seated Isolated Finger PIP Flexion AROM  - 1 x daily - 7 x weekly - 1 sets - 5 reps - Seated Finger Composite Flexion Stretch  - 1 x daily - 7 x weekly - 1 sets - 5 reps - Wrist Flexion and Extension Caregiver PROM  - 1 x daily - 7 x weekly - 2 sets - 10 reps - 5-10 sec hold   GOALS: Goals reviewed with patient? Yes  SHORT TERM GOALS: Target date: 10/13/22  Pt and spouse will be independent with ROM and coordination HEP. Baseline: Goal status: MET - 10/16/22  2.  Pt and spouse will verbalize understanding of supine and sitting positioning to decrease edema and pain in RUE. Baseline:  Goal status: MET - 10/16/22  3.  Pt will verbalize understanding of task modifications and/or potential AE needs to increase ease, safety, and independence w/ ADLs. Baseline:  Goal status: IN PROGRESS  4.  Pt will be able to complete UB dressing with min assist demonstrating/verbalizing understanding of hemi-dressing technique. Baseline:  Goal status: MET - 10/16/22    LONG TERM GOALS: Target date: 11/10/22  Pt will be able to complete UB dressing with Supervision assist (to include donning/fastening bra PRN) demonstrating/verbalizing understanding of hemi-dressing technique. Baseline:  Goal status: IN PROGRESS  2.  Pt will be able to incorporate use of RUE at non-dominant level during  LB dressing, to be completed at Mod I level. Baseline:  Goal status: IN PROGRESS  3.  Pt will demonstrate ability to pick up light weight object from table top as needed to increase engagement of RUE into ADLs and self-eeding. Baseline:  Goal status: IN PROGRESS  4.  Pt will demonstrate improved UE functional use for ADLs as evidenced by increasing box/ blocks score by 5 blocks with  RUE, to be completed in standardized manner. Baseline: Right 2blocks, Left 45 blocks. Modified to complete with RUE.  Pt able to grasp and move across to other side of box with barrier removed.   Goal status: IN PROGRESS  5.  Pt will report decreased pain in dominant RUE interfering with ADLs/IADLs by decreased impairment score on Quick DASH. Baseline: 77% Goal status: IN PROGRESS  6.  Pt will be independent with splint wear and care PRN Baseline: splint needs TBD Goal status: IN PROGRESS  ASSESSMENT:  CLINICAL IMPRESSION: Pt continues to be limited by decreased shoulder flexion, elbow flexion/extension, and finger extension due to hypertonicity. OT providing reiteration and additional verbal, tactile, and written cues for increased carryover of technique to home.  Pt continues to be focused on hand and finger movements, however responsive to importance of resuming shoulder exercises.  OT to reach out to MD for order for resting hand splint to order through [facility] as pt will still benefit from splinting for finger extension, but remains hesitant about rigidity of custom splint.  PERFORMANCE DEFICITS: in functional skills including ADLs, IADLs, coordination, sensation, edema, tone, ROM, strength, pain, flexibility, Fine motor control, Gross motor control, balance, body mechanics, endurance, decreased knowledge of use of DME, skin integrity, vision, and UE functional use, cognitive skills including emotional and safety awareness, and psychosocial skills including coping strategies.   IMPAIRMENTS: are limiting patient from ADLs, IADLs, and rest and sleep.   CO-MORBIDITIES: may have co-morbidities  that affects occupational performance. Patient will benefit from skilled OT to address above impairments and improve overall function.  MODIFICATION OR ASSISTANCE TO COMPLETE EVALUATION: Min-Moderate modification of tasks or assist with assess necessary to complete an evaluation.  OT OCCUPATIONAL  PROFILE AND HISTORY: Detailed assessment: Review of records and additional review of physical, cognitive, psychosocial history related to current functional performance.  CLINICAL DECISION MAKING: Moderate - several treatment options, min-mod task modification necessary  REHAB POTENTIAL: Good  EVALUATION COMPLEXITY: Moderate    PLAN:  OT FREQUENCY: 2x/week  OT DURATION: 8 weeks  PLANNED INTERVENTIONS: self care/ADL training, therapeutic exercise, therapeutic activity, neuromuscular re-education, manual therapy, passive range of motion, balance training, functional mobility training, splinting, paraffin, fluidotherapy, biofeedback, compression bandaging, moist heat, cryotherapy, contrast bath, patient/family education, visual/perceptual remediation/compensation, psychosocial skills training, coping strategies training, and DME and/or AE instructions  RECOMMENDED OTHER SERVICES: NA  CONSULTED AND AGREED WITH PLAN OF CARE: Patient and family member/caregiver  PLAN FOR NEXT SESSION: AAROM or self-ROM to include shoulder, elbow, forearm at next session.  Continue to focus on PROM, massage, and stretching.  Functional grasp and release.     Simonne Come, OTR/L 10/23/2022, 4:02 PM

## 2022-10-23 NOTE — Therapy (Signed)
OUTPATIENT PHYSICAL THERAPY TREATMENT NOTE   Patient Name: Angela Buchanan MRN: [id number] DOB:08-Sep-1951, 72 y.o., female Today's Date: 10/23/2022  PCP:  Sela Hilding, MD   REFERRING PROVIDER:  Sela Hilding, MD    END OF SESSION:   PT End of Session - 10/23/22 1236     Visit Number 9    Date for PT Re-Evaluation 11/22/22    Authorization Type [employer]- visit requested    Authorization - Visit Number 9    Authorization - Number of Visits 12    Progress Note Due on Visit 10    PT Start Time 1230    PT Stop Time 1312    PT Time Calculation (min) 42 min    Activity Tolerance Patient tolerated treatment well    Behavior During Therapy [facility] for tasks assessed/performed                  Past Medical History:  Diagnosis Date   Atypical chest pain 07/19/2021   Bigeminy    no current med.   Breast cancer ([place]) 06/2017   right   Bruises easily    Carotid bruit 11/13/2019   Dental crowns present    History of diverticulitis    Hypertension    states under control with med., has been on med. x 5 yr.   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   Past Surgical History:  Procedure Laterality Date   ABDOMINAL HYSTERECTOMY     partial   BREAST LUMPECTOMY Right 06/22/2017   Malignant   BREAST LUMPECTOMY WITH RADIOACTIVE SEED AND SENTINEL LYMPH NODE BIOPSY Right 06/22/2017   Procedure: RIGHT BREAST LUMPECTOMY WITH RADIOACTIVE SEED AND RIGHT AXILLARY DEEP SENTINEL LYMPH NODE BIOPSY WITH BLUE DYE INJECTION;  Surgeon: Fanny Skates, MD;  Location: [place];  Service: General;  Laterality: Right;   BREAST LUMPECTOMY WITH RADIOACTIVE SEED LOCALIZATION Left 06/22/2017   Procedure: LEFT BREAST LUMPECTOMY WITH  RADIOACTIVE SEED LOCALIZATION;  Surgeon: Fanny Skates, MD;  Location: [place];  Service: General;  Laterality: Left;   CATARACT EXTRACTION W/ INTRAOCULAR LENS  IMPLANT, BILATERAL Bilateral    EXCISION OF BREAST BIOPSY Left 06/22/2017   benign   LOOP RECORDER INSERTION N/A 05/16/2022   Procedure: LOOP RECORDER INSERTION;  Surgeon: Vickie Epley, MD;  Location: [place];  Service: Cardiovascular;  Laterality: N/A;   PERCUTANEOUS PINNING Right 06/22/2022   Procedure: PERCUTANEOUS PINNING OF RIGHT HIP;  Surgeon: Hiram Gash, MD;  Location: [facility];  Service: Orthopedics;  Laterality: Right;   TONSILLECTOMY     age 56   Patient Active Problem List   Diagnosis Date Noted   Malnutrition of moderate degree 06/22/2022   Closed fracture of femur, neck ([place]) 06/21/2022   Urinary frequency 06/21/2022   Hypokalemia 06/21/2022   Heart murmur 06/21/2022   Hypothyroidism 06/21/2022   Dysphagia 05/17/2022   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   CVA (cerebral vascular accident) ([place]) 05/12/2022   Osteoporosis 01/24/2022   Atypical chest pain 07/19/2021   Temporomandibular joint (TMJ) pain 07/16/2020   Referred otalgia of both ears 07/16/2020   PAC (premature atrial contraction) 05/11/2020   PVC (premature ventricular contraction) 05/11/2020   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   Carotid bruit 11/13/2019  Pure hypercholesterolemia 11/13/2019   Malignant neoplasm of upper-outer quadrant of right breast in female, estrogen receptor positive ([place]) 05/22/2017   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   Diplopia 09/17/2014   Dizziness and giddiness 09/17/2014   Essential hypertension 07/22/2014   Chest pain 07/22/2014   Family history of heart disease 07/22/2014    REFERRING DIAG:  R29.6 (ICD-10-CM) - Repeated falls, closed fracture of Rt hip    THERAPY DIAG:  Muscle weakness (generalized)  Other lack of coordination  Hemiplegia and hemiparesis following  cerebral infarction affecting left non-dominant side (HCC)  Pain in right arm  Unsteadiness on feet  Other abnormalities of gait and mobility  Rationale for Evaluation and Treatment Rehabilitation  PERTINENT HISTORY: hypertension, breast cancer, hyperlipidemia, Lt hip fracture secondary to fall 06/2022, CVA 06/2022   PRECAUTIONS:  Fall, Rt breast cancer with node removal, osteoporosis    SUBJECTIVE:                                                                                                                                                                                      SUBJECTIVE STATEMENT:My legs were very sore doing two laps. I think we should have done 1 last time.    PAIN:  Are you having pain? Not right now but my right hand and arm can be very sore at times.    OBJECTIVE: (objective measures completed at initial evaluation unless otherwise dated)  DIAGNOSTIC FINDINGS: Rt hip fracture and ORIF     COGNITION: Overall cognitive status: Within functional limits for tasks assessed                         SENSATION: WFL     POSTURE: rounded shoulders, forward head, flexed trunk , and weight shift left   PALPATION: NA   LOWER EXTREMITY ROM: Rt hip limited by 50%, hamstrings limited by 50% bil.    LOWER EXTREMITY MMT:   MMT Right eval Right  09/27/22 Left eval  Hip flexion 3+ 4- 4-  Hip extension       Hip abduction 3-  4  Hip adduction       Hip internal rotation       Hip external rotation       Knee flexion 3- 4- 3+  Knee extension 3+ 4 4-  Ankle dorsiflexion 3+ 4- 4-  Ankle plantarflexion       Ankle inversion       Ankle eversion        (Blank rows = not tested)   FUNCTIONAL TESTS:  5 times sit to stand: not able to due per pt  request secondary to Lt shoulder pain with sit to stand.  Max LT UE pain with sit to stand   Timed up and go (TUG): 1 min, 27 seconds   09/27/22: 5x sit to stand: 29 seconds with use of Lt hand  TUG: 27 seconds     GAIT: Distance walked: 25 feet  Assistive device utilized: Hemi walker on Lt Level of assistance: SBA Comments: slow mobility, reduced trunk rotation, reduce Rt LE stance time and reduced Rt arm swing     TODAY'S TREATMENT:     10/23/22: Nustep L3 6 min with PTA present to monitor and discuss progress  LAQ 2.5# 10x Bil, feet on purple mat. VC to control RTLE Blu eloop clamshells x15 GAIT: 1 lap of building with standard cane and light CGA, a few short stops to catch breath due to faster pace.  Sit to stand 2x10  10/16/22:  Nustep L1 8 min with PTA present to monitor and discuss progress  LAQ: 3# 10x Lt 6x RT Seated clamshells blue loop 2x10, pt needs foot prop Sit to stand: 2x10 GAIT: 2 Full laps of building with straight cane and CGA. Pt had 1 episode where she leaned heavy RT but did not loose her balance.     09/27/22: Nustep L1 8 min with PT present to monitor and discuss progress  Standing heel raises at barre 2x10 Standing hip abduction at barre 2x10 Sit to stand with use of Lt UE 2x10 Re-evaluation complted   GAIT:   around clinic with standard cane. CGA by PT and cueing for technique   PATIENT EDUCATION:  Education details: Access Code: [email or web address] Person educated: Patient Education method: Explanation, Demonstration, and Handouts Education comprehension: verbalized understanding and returned demonstration   HOME EXERCISE PROGRAM: Access Code: [email or web address] URL: [email or web address] Date: 09/27/2022 Prepared by: Claiborne Billings  Exercises - Seated Long Arc Quad  - 3 x daily - 7 x weekly - 2 sets - 10 reps - 5 hold - Seated March   - 3 x daily - 7 x weekly - 3 sets - 10 reps - Seated Heel Toe Raises   - 3 x daily - 7 x weekly - 2 sets - 10 reps - Seated Isometric Hip Adduction with Ball  - 3 x daily - 7 x weekly - 2 sets - 10 reps - Sit to Stand with Armchair  - 2 x daily - 7 x weekly - 2 sets - 5-10 reps - Standing Hip Abduction with Counter Support  - 1 x  daily - 7 x weekly - 2 sets - 10 reps - Heel Raises with Counter Support  - 2 x daily - 7 x weekly - 2 sets - 10 reps  Patient Education - Walking with a Single General Bernat - Modified 4 Point Gait Pattern  ASSESSMENT:   CLINICAL IMPRESSION: Pt was able to ambulate faster today with cane but definitely required a few short rest breaks due to being out of breath.   OBJECTIVE IMPAIRMENTS: Abnormal gait, decreased activity tolerance, decreased balance, decreased mobility, difficulty walking, decreased strength, decreased safety awareness, impaired perceived functional ability, impaired flexibility, postural dysfunction, and pain.    ACTIVITY LIMITATIONS: carrying, lifting, sitting, standing, stairs, transfers, hygiene/grooming, and locomotion level   PARTICIPATION LIMITATIONS: meal prep, cleaning, laundry, driving, shopping, and community activity   PERSONAL FACTORS: Age, Past/current experiences, and 1-2 comorbidities: CVA, falls, Rt hip fracture with ORIF  are also affecting patient's functional outcome.    REHAB POTENTIAL: Good   CLINICAL  DECISION MAKING: Evolving/moderate complexity   EVALUATION COMPLEXITY: Moderate     GOALS: Goals reviewed with patient? Yes   SHORT TERM GOALS: Target date: 09/06/2022   Be independent in initial HEP Baseline: Goal status: Goal met 08/14/22   2.  Improve LE strength to perform sit to stand with moderate Lt UE support Baseline:  Goal status: Goal met 08/28/22  3.  Perform TUG in < or = to 60 seconds to reduce falls risk Baseline: 27 seconds (09/27/22) Goal status: MET          LONG TERM GOALS: Target date:  11/22/22   Be independent in advanced HEP Baseline: independent in current HEP and further progress is needed  Goal status: in progress    2.  Perform TUG in < or = to 15 seconds to reduce falls risk Baseline: 27 seconds (09/27/22) Goal status: REVISED    3.  Improve LE strength to perform sit to stand with min to no UE support   Baseline: min to moderate Lt UE support (09/27/22) Goal status: in progress    4.  Ambulate with hemi walker with supervision or no guard due to improved gait Baseline: able to do this Goal status: MET   5.  Demonstrate > or = to 4-/5 to 4/5 Rt hip and knee strength to improve safety and stability  Baseline: see above chart (09/27/22) Goal status: In progress   6. Ambulate with cane for all distances and demonstrate independence due to improved stability  Baseline: using hemiwalker  Goal status: INITIAL     PLAN:   PT FREQUENCY: 1-2x/week   PT DURATION: 8 weeks   PLANNED INTERVENTIONS: Therapeutic exercises, Therapeutic activity, Neuromuscular re-education, Balance training, Gait training, Patient/Family education, Self Care, Joint mobilization, Stair training, Dry Needling, Electrical stimulation, Cryotherapy, Moist heat, Taping, Manual therapy, and Re-evaluation   PLAN FOR NEXT SESSION: Continue to work on gait , strength and balance   Myrene Galas, PTA 10/23/22 1:12 PM

## 2022-10-25 ENCOUNTER — Ambulatory Visit: Payer: Medicare PPO | Admitting: Occupational Therapy

## 2022-10-25 DIAGNOSIS — R293 Abnormal posture: Secondary | ICD-10-CM | POA: Diagnosis not present

## 2022-10-25 DIAGNOSIS — I69354 Hemiplegia and hemiparesis following cerebral infarction affecting left non-dominant side: Secondary | ICD-10-CM

## 2022-10-25 DIAGNOSIS — R278 Other lack of coordination: Secondary | ICD-10-CM

## 2022-10-25 DIAGNOSIS — M6281 Muscle weakness (generalized): Secondary | ICD-10-CM | POA: Diagnosis not present

## 2022-10-25 DIAGNOSIS — M79601 Pain in right arm: Secondary | ICD-10-CM

## 2022-10-25 DIAGNOSIS — R2681 Unsteadiness on feet: Secondary | ICD-10-CM | POA: Diagnosis not present

## 2022-10-25 NOTE — Therapy (Signed)
OUTPATIENT OCCUPATIONAL THERAPY  Treatment Session  Patient Name: Angela Buchanan MRN: [id number] DOB:January 20, 1951, 72 y.o., female Today's Date: 10/25/2022  PCP: Glenis Smoker, MD REFERRING PROVIDER: Glenis Smoker, MD       END OF SESSION:  OT End of Session - 10/25/22 1407     Visit Number 12    Number of Visits 17    Date for OT Re-Evaluation 11/10/22    Authorization Type [employer]    OT Start Time 6546    OT Stop Time 1455    OT Time Calculation (min) 50 min                       Past Medical History:  Diagnosis Date   Atypical chest pain 07/19/2021   Bigeminy    no current med.   Breast cancer ([place]) 06/2017   right   Bruises easily    Carotid bruit 11/13/2019   Dental crowns present    History of diverticulitis    Hypertension    states under control with med., has been on med. x 5 yr.   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   Past Surgical History:  Procedure Laterality Date   ABDOMINAL HYSTERECTOMY     partial   BREAST LUMPECTOMY Right 06/22/2017   Malignant   BREAST LUMPECTOMY WITH RADIOACTIVE SEED AND SENTINEL LYMPH NODE BIOPSY Right 06/22/2017   Procedure: RIGHT BREAST LUMPECTOMY WITH RADIOACTIVE SEED AND RIGHT AXILLARY DEEP SENTINEL LYMPH NODE BIOPSY WITH BLUE DYE INJECTION;  Surgeon: Fanny Skates, MD;  Location: [facility];  Service: General;  Laterality: Right;   BREAST LUMPECTOMY WITH RADIOACTIVE SEED LOCALIZATION Left 06/22/2017   Procedure: LEFT BREAST LUMPECTOMY WITH RADIOACTIVE SEED LOCALIZATION;  Surgeon: Fanny Skates, MD;  Location: [facility];  Service: General;  Laterality: Left;   CATARACT EXTRACTION W/ INTRAOCULAR LENS   IMPLANT, BILATERAL Bilateral    EXCISION OF BREAST BIOPSY Left 06/22/2017   benign   LOOP RECORDER INSERTION N/A 05/16/2022   Procedure: LOOP RECORDER INSERTION;  Surgeon: Vickie Epley, MD;  Location: [facility];  Service: Cardiovascular;  Laterality: N/A;   PERCUTANEOUS PINNING Right 06/22/2022   Procedure: PERCUTANEOUS PINNING OF RIGHT HIP;  Surgeon: Hiram Gash, MD;  Location: [facility];  Service: Orthopedics;  Laterality: Right;   TONSILLECTOMY     age 17   Patient Active Problem List   Diagnosis Date Noted   Malnutrition of moderate degree 06/22/2022   Closed fracture of femur, neck ([place]) 06/21/2022   Urinary frequency 06/21/2022   Hypokalemia 06/21/2022   Heart murmur 06/21/2022   Hypothyroidism 06/21/2022   Dysphagia 05/17/2022   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   CVA (cerebral vascular accident) ([place]) 05/12/2022   Osteoporosis 01/24/2022   Atypical chest pain 07/19/2021   Temporomandibular joint (TMJ) pain 07/16/2020   Referred otalgia of both ears 07/16/2020   PAC (premature atrial contraction) 05/11/2020   PVC (premature ventricular contraction) 05/11/2020   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   Carotid bruit 11/13/2019   Pure hypercholesterolemia 11/13/2019   Malignant neoplasm of upper-outer quadrant of right breast in female, estrogen receptor positive ([place]) 05/22/2017   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   Diplopia 09/17/2014  Dizziness and giddiness 09/17/2014   Essential hypertension 07/22/2014   Chest pain 07/22/2014   Family history of heart disease 07/22/2014    ONSET DATE: CVA 05/12/22  REFERRING DIAG: M79.641 (ICD-10-CM) - Pain in right hand  THERAPY DIAG:  Other lack of coordination  Muscle weakness (generalized)  Hemiplegia and hemiparesis following cerebral infarction affecting left non-dominant side (HCC)  Pain in right arm  Rationale for Evaluation and Treatment: Rehabilitation  SUBJECTIVE:   SUBJECTIVE STATEMENT: Pt  expressing desire to regain full function in R hand to be able to return to driving. Pt accompanied by: self and significant other (Husband, [name])  PERTINENT HISTORY: hypertension, breast cancer, hyperlipidemia, Lt hip fracture secondary to fall 06/2022, CVA 06/2022   PRECAUTIONS: Fall, Rt breast cancer with node removal, osteoporosis  WEIGHT BEARING RESTRICTIONS: No  PAIN:  Are you having pain? Yes: NPRS scale: 6/10 Pain location: R shoulder and upper arm Pain description: aching Aggravating factors: movement to arm Relieving factors: repositioning, pain meds  FALLS: Has patient fallen in last 6 months? Yes. Number of falls 1 fall, one week after d/c home from SNF  LIVING ENVIRONMENT: Lives with: lives with their spouse Lives in: House/apartment Stairs: Yes: External: 2 steps; on left going up Has following equipment at home: Gilford Rile - 2 wheeled, Hemi walker, Wheelchair (manual), shower chair, and Grab bars  PLOF: Independent and Independent with basic ADLs  PATIENT GOALS: to get use of my R arm again  OBJECTIVE:   HAND DOMINANCE: Right  ADLs: Transfers/ambulation related to ADLs: Mod I with hemi-walker Eating: using L hand for eating, husband is assisting with cutting up food Grooming: Mod I, using L hand UB Dressing: Mod assist LB Dressing: Max assist Toileting: Mod I Bathing: Supervision - Mod I Tub Shower transfers: Min assist Equipment: Shower seat with back and Grab bars  UPPER EXTREMITY ROM:    Active ROM Right eval Left eval Right 10/18/22   Shoulder flexion 54    Shoulder abduction     Shoulder adduction     Shoulder extension     Shoulder internal rotation     Shoulder external rotation     Elbow flexion 108    Elbow extension -24    Wrist flexion     Wrist extension     Wrist ulnar deviation     Wrist radial deviation     Wrist pronation     Wrist supination     (Blank rows = not tested)  Passive ROM Right eval Left eval Right 10/18/22   Shoulder flexion 65  76  Shoulder abduction     Shoulder adduction     Shoulder extension     Shoulder internal rotation     Shoulder external rotation     Elbow flexion     Elbow extension -17  -15  Wrist flexion 46  56  Wrist extension 27  50  Wrist ulnar deviation     Wrist radial deviation     Wrist pronation     Wrist supination     (Blank rows = not tested)  Passive ROM Right 10/18/22  Thumb MCP (0-60)   Thumb IP (0-80)   Thumb Radial abd/add (0-55)   Thumb Palmar abd/add (0-45)   Thumb Opposition to Small Finger   Index MCP (0-90)   Index PIP (0-100)   Index DIP (0-70)    Long MCP (0-90)    Long PIP (0-100) -30   Long DIP (0-70) -20  Ring MCP (  0-90)    Ring PIP (0-100) -25   Ring DIP (0-70) -15   Little MCP (0-90)    Little PIP (0-100)    Little DIP (0-70)    (Blank rows = not tested)   UPPER EXTREMITY MMT:   not assessed due to pain with any PROM, AROM    HAND FUNCTION: Loose gross grasp   COORDINATION: Box and Blocks:  Right 2blocks, Left 45 blocks. Modified to complete with RUE.  Pt able to grasp and move across to other side of box with barrier removed.    SENSATION: Mild decrease in R  EDEMA: mild edema in wrist hand, and fingers on RUE  OBSERVATIONS: Pt guarded with movement, hypersensitive to touch and PROM.     TODAY'S TREATMENT:                                   10/25/22 ADL: Educated on current progress towards ordering splint vs custom fabrication of splint during session.  Pt and spouse verbalizing frustration with need to go somewhere else to order a prefabricated splint, due to fact that therapy clinic is not a DME provider.  Discussed OT able to fabricate a splint to meet pt's particular/specific needs.  Pt agreeable to attempt.  Educated on splint wear and care with focus on initially wearing only 2 hours and assessing skin for redness or irritation after wear for 2 hours.  Discussed progression of splint wear after 2-3 days to 4 hours.   Pt and spouse verbalizing understanding of wear schedule and want to look for.  Pt able to doff and don with min cues when donning to ensure proper positioning of wrist to allow for extension at finger tips. Table slides: pt engaging in table slides with focus on shoulder flexion and horizontal abduction/adduction intermittently during splint fabrication. Splint fabrication: OT cut out pattern from previous session and utilized [employer] perforated Splinting Material to fabricate resting hand splint to facilitate increased extension and address contractures in long and ring fingers.   OT applied straps to resting hand splint at DIP, wrist, and forearm to facilitate appropriate fit.     10/23/22 UE exercise: Chest press and shoulder flexion with 1# dowel x10.  OT providing min cues and tactile cues to address elbow extension during overhead reach.  Engaged in horizontal abduction with 1# dowel x10 with focus on ROM and stretch due to tightness in pecs.  OT facilitated abduction exercise with "chicken wing" with focus on pt holding position in abduction for 5-10 seconds for additional stretch.  Fading from PROM to [facility].  Engaged in [place] chest press with focus on quality of movement and controlled descent.   Transitional movements: OT educated on bed mobility as pt reporting difficulty when attempting to sit up from supine.  OT demonstrated and educated on long rolling and pushing up with LUE while advancing LB over edge of mat.  Pt will continue to benefit from practice of technique. Finger/wrist extension: OT provided multimodal cues for caregiver assisted PROM providing initial demonstration to verbal cues while spouse demonstrated technique.  Therapist provided additional written cues for technique to ensure proper positioning and technique.  Pt demonstrating PROM finger extension followed by flexion with OT providing cues to ensure flexion as well as extension during  HEP.   10/18/22 Moist heat: applied moist heat prior to engaging in PROM to allow for increased ROM.  Engaged in  discussion regarding current issue impacting ordering/purchasing a prefabricated splint.   PROM: OT positioned R forearm in supination with wrist in neutral while providing stretch at long and ring finger in isolation into extension.  OT complete composite finger flexion with cues for technique while reiterating completing flexion in addition to extension with each stretch into extension.  Pt tolerating increased extension and flexion this session.   AROM: OT directed pt in attempts at making a full fist, pt with demonstrating moderate flexion, but unable to complete full/tight grasp. Wrist extension: Engaged in [place] for increased wrist extension with use of medium therapy ball.  Pt completing wrist extension while rolling ball, requiring min cues for positioning and technique.  Reiterated recommendation for pt's spouse to complete PROM at wrist. HEP: OT reviewed previously administered UE HEP, reiterating importance of shoulder flexion and abduction exercises.  OT educating on use of LUE as gross assist in unable to complete with dowel as initially encouraged.    PATIENT EDUCATION: Education details: educated on splint wear and care and to look for skin break down Person educated: Patient and Spouse Education method: Explanation, Media planner, Corporate treasurer cues, Verbal cues, and Handouts Education comprehension: verbalized understanding and needs further education  HOME EXERCISE PROGRAM: Access Code: [email or web address] URL: [email or web address] Date: 10/23/2022 Prepared by: [facility]  Exercises - Supine Shoulder Flexion AAROM with Dowel  - 1 x daily - 3 x weekly - 2 sets - 10 reps - Supine Shoulder Protraction with Dowel  - 1 x daily - 3 x weekly - 2 sets - 10 reps - Supine Shoulder Abduction AAROM with Dowel  - 1 x daily - 3 x weekly - 2  sets - 10 reps - Standing Single Bent Arm Shoulder Abduction   - 1 x daily - 3 x weekly - 2 sets - 10 reps - 5-10 sec hold - Hooklying Single Arm Chest Press  - 1 x daily - 3 x weekly - 2 sets - 10 reps - Seated Forearm Pronation Supination AROM  - 1 x daily - 7 x weekly - 2 sets - 10 reps - Hand PROM Finger Extension  - 1 x daily - 7 x weekly - 2 sets - 10 reps - Thumb AROM Opposition  - 1 x daily - 7 x weekly - 1 sets - 5 reps - Seated Isolated Finger PIP Flexion AROM  - 1 x daily - 7 x weekly - 1 sets - 5 reps - Seated Finger Composite Flexion Stretch  - 1 x daily - 7 x weekly - 1 sets - 5 reps - Wrist Flexion and Extension Caregiver PROM  - 1 x daily - 7 x weekly - 2 sets - 10 reps - 5-10 sec hold   GOALS: Goals reviewed with patient? Yes  SHORT TERM GOALS: Target date: 10/13/22  Pt and spouse will be independent with ROM and coordination HEP. Baseline: Goal status: MET - 10/16/22  2.  Pt and spouse will verbalize understanding of supine and sitting positioning to decrease edema and pain in RUE. Baseline:  Goal status: MET - 10/16/22  3.  Pt will verbalize understanding of task modifications and/or potential AE needs to increase ease, safety, and independence w/ ADLs. Baseline:  Goal status: IN PROGRESS  4.  Pt will be able to complete UB dressing with min assist demonstrating/verbalizing understanding of hemi-dressing technique. Baseline:  Goal status: MET - 10/16/22    LONG TERM GOALS: Target date: 11/10/22  Pt will be able to complete UB dressing with Supervision assist (to include donning/fastening bra PRN) demonstrating/verbalizing understanding of hemi-dressing technique. Baseline:  Goal status: IN PROGRESS  2.  Pt will be able to incorporate use of RUE at non-dominant level during LB dressing, to be completed at Mod I level. Baseline:  Goal status: IN PROGRESS  3.  Pt will demonstrate ability to pick up light weight object from table top as needed to increase  engagement of RUE into ADLs and self-eeding. Baseline:  Goal status: IN PROGRESS  4.  Pt will demonstrate improved UE functional use for ADLs as evidenced by increasing box/ blocks score by 5 blocks with RUE, to be completed in standardized manner. Baseline: Right 2blocks, Left 45 blocks. Modified to complete with RUE.  Pt able to grasp and move across to other side of box with barrier removed.   Goal status: IN PROGRESS  5.  Pt will report decreased pain in dominant RUE interfering with ADLs/IADLs by decreased impairment score on Quick DASH. Baseline: 77% Goal status: IN PROGRESS  6.  Pt will be independent with splint wear and care PRN Baseline: splint needs TBD Goal status: IN PROGRESS  ASSESSMENT:  CLINICAL IMPRESSION: Pt and spouse voicing frustration with long process to this point, complicated by broken hip s/p fall impacting typical f/u s/p CVA.  OT educating on typical rehab process and frequency of onset of hypertonicity post CVA.  Pt agreeable to attempting custom splinting this session.  Pt able to return demonstration of doffing splint without cues and min cues for appropriate positioning when donning splint for appropriate fit.  Pt continues to be limited by decreased shoulder flexion, elbow flexion/extension, and finger extension due to hypertonicity.   PERFORMANCE DEFICITS: in functional skills including ADLs, IADLs, coordination, sensation, edema, tone, ROM, strength, pain, flexibility, Fine motor control, Gross motor control, balance, body mechanics, endurance, decreased knowledge of use of DME, skin integrity, vision, and UE functional use, cognitive skills including emotional and safety awareness, and psychosocial skills including coping strategies.   IMPAIRMENTS: are limiting patient from ADLs, IADLs, and rest and sleep.   CO-MORBIDITIES: may have co-morbidities  that affects occupational performance. Patient will benefit from skilled OT to address above impairments and  improve overall function.  MODIFICATION OR ASSISTANCE TO COMPLETE EVALUATION: Min-Moderate modification of tasks or assist with assess necessary to complete an evaluation.  OT OCCUPATIONAL PROFILE AND HISTORY: Detailed assessment: Review of records and additional review of physical, cognitive, psychosocial history related to current functional performance.  CLINICAL DECISION MAKING: Moderate - several treatment options, min-mod task modification necessary  REHAB POTENTIAL: Good  EVALUATION COMPLEXITY: Moderate    PLAN:  OT FREQUENCY: 2x/week  OT DURATION: 8 weeks  PLANNED INTERVENTIONS: self care/ADL training, therapeutic exercise, therapeutic activity, neuromuscular re-education, manual therapy, passive range of motion, balance training, functional mobility training, splinting, paraffin, fluidotherapy, biofeedback, compression bandaging, moist heat, cryotherapy, contrast bath, patient/family education, visual/perceptual remediation/compensation, psychosocial skills training, coping strategies training, and DME and/or AE instructions  RECOMMENDED OTHER SERVICES: NA  CONSULTED AND AGREED WITH PLAN OF CARE: Patient and family member/caregiver  PLAN FOR NEXT SESSION: Review splint fit and make modification as needed.  AAROM or self-ROM to include shoulder, elbow, forearm at next session.  Continue to focus on PROM, massage, and stretching.  Functional grasp and release.     Simonne Come, OTR/L 10/25/2022, 4:20 PM

## 2022-10-27 DIAGNOSIS — G8191 Hemiplegia, unspecified affecting right dominant side: Secondary | ICD-10-CM | POA: Diagnosis not present

## 2022-10-27 DIAGNOSIS — F411 Generalized anxiety disorder: Secondary | ICD-10-CM | POA: Diagnosis not present

## 2022-10-29 NOTE — Therapy (Addendum)
OUTPATIENT PHYSICAL THERAPY TREATMENT NOTE   Patient Name: Angela Buchanan MRN: [id number] DOB:09-28-1951, 72 y.o., female Today's Date: 10/30/2022  PCP:  Sela Hilding, MD   REFERRING PROVIDER:  Sela Hilding, MD   Progress Note Reporting Period 08/09/22 to 10/30/22  See note below for Objective Data and Assessment of Progress/Goals.  Sigurd Sos, PT 10/30/22 3:29 PM    END OF SESSION:   PT End of Session - 10/30/22 1227     Visit Number 10    Date for PT Re-Evaluation 11/22/22    Authorization Type [employer]- visit requested    Authorization - Visit Number 10    Authorization - Number of Visits 12    Progress Note Due on Visit 10    PT Start Time 1221    PT Stop Time 1310    PT Time Calculation (min) 49 min    Activity Tolerance Patient tolerated treatment well    Behavior During Therapy [facility] for tasks assessed/performed                   Past Medical History:  Diagnosis Date   Atypical chest pain 07/19/2021   Bigeminy    no current med.   Breast cancer ([place]) 06/2017   right   Bruises easily    Carotid bruit 11/13/2019   Dental crowns present    History of diverticulitis    Hypertension    states under control with med., has been on med. x 5 yr.   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   Past Surgical History:  Procedure Laterality Date   ABDOMINAL HYSTERECTOMY     partial   BREAST LUMPECTOMY Right 06/22/2017   Malignant   BREAST LUMPECTOMY WITH RADIOACTIVE SEED AND SENTINEL LYMPH NODE BIOPSY Right 06/22/2017   Procedure: RIGHT BREAST LUMPECTOMY WITH RADIOACTIVE SEED AND RIGHT AXILLARY DEEP SENTINEL LYMPH NODE BIOPSY WITH BLUE DYE INJECTION;  Surgeon: Fanny Skates, MD;  Location: [place];  Service: General;  Laterality: Right;   BREAST LUMPECTOMY WITH RADIOACTIVE SEED LOCALIZATION Left 06/22/2017   Procedure: LEFT BREAST LUMPECTOMY WITH RADIOACTIVE SEED LOCALIZATION;  Surgeon: Fanny Skates, MD;  Location: [place];  Service: General;  Laterality: Left;   CATARACT EXTRACTION W/ INTRAOCULAR LENS  IMPLANT, BILATERAL Bilateral    EXCISION OF BREAST BIOPSY Left 06/22/2017   benign   LOOP RECORDER INSERTION N/A 05/16/2022   Procedure: LOOP RECORDER INSERTION;  Surgeon: Vickie Epley, MD;  Location: [place] [facility];  Service: Cardiovascular;  Laterality: N/A;   PERCUTANEOUS PINNING Right 06/22/2022   Procedure: PERCUTANEOUS PINNING OF RIGHT HIP;  Surgeon: Hiram Gash, MD;  Location: [facility];  Service: Orthopedics;  Laterality: Right;   TONSILLECTOMY     age 18   Patient Active Problem List   Diagnosis Date Noted   Malnutrition of moderate degree 06/22/2022   Closed fracture of femur, neck ([place]) 06/21/2022   Urinary frequency 06/21/2022   Hypokalemia 06/21/2022   Heart murmur 06/21/2022   Hypothyroidism 06/21/2022   Dysphagia 05/17/2022   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   CVA (cerebral vascular accident) ([place]) 05/12/2022   Osteoporosis 01/24/2022   Atypical chest pain 07/19/2021   Temporomandibular joint (TMJ) pain 07/16/2020   Referred otalgia of both  ears 07/16/2020   PAC (premature atrial contraction) 05/11/2020   PVC (premature ventricular contraction) 05/11/2020   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   Carotid bruit 11/13/2019   Pure hypercholesterolemia 11/13/2019   Malignant neoplasm of upper-outer quadrant of right breast in female, estrogen receptor positive ([place]) 05/22/2017   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   Diplopia 09/17/2014   Dizziness and giddiness 09/17/2014   Essential hypertension 07/22/2014   Chest pain 07/22/2014   Family history of heart disease 07/22/2014    REFERRING DIAG:  R29.6 (ICD-10-CM)  - Repeated falls, closed fracture of Rt hip    THERAPY DIAG:  Other lack of coordination  Muscle weakness (generalized)  Unsteadiness on feet  Other abnormalities of gait and mobility  Abnormal posture  Rationale for Evaluation and Treatment Rehabilitation  PERTINENT HISTORY: hypertension, breast cancer, hyperlipidemia, Lt hip fracture secondary to fall 06/2022, CVA 06/2022   PRECAUTIONS:  Fall, Rt breast cancer with node removal, osteoporosis    SUBJECTIVE:                                                                                                                                                                                      SUBJECTIVE STATEMENT: I can put my dog up on my bed now and I could not do that a month ago. I have more confidence to try different things now.     PAIN:  Are you having pain? Not right now but my right hand and arm can be very sore at times.    OBJECTIVE: (objective measures completed at initial evaluation unless otherwise dated)  DIAGNOSTIC FINDINGS: Rt hip fracture and ORIF     COGNITION: Overall cognitive status: Within functional limits for tasks assessed                         SENSATION: WFL     POSTURE: rounded shoulders, forward head, flexed trunk , and weight shift left   PALPATION: NA   LOWER EXTREMITY ROM: Rt hip limited by 50%, hamstrings limited by 50% bil.    LOWER EXTREMITY MMT:   MMT Right eval Right  09/27/22 Left eval Right 10/30/22 Left 10/30/22  Hip flexion 3+ 4- 4- 4- 4-  Hip extension         Hip abduction 3-  4    Hip adduction         Hip internal rotation         Hip external rotation         Knee flexion 3- 4- 3+    Knee extension 3+ 4 4- 4 painful hip 4-  Ankle dorsiflexion 3+  4- 4- 4- 4  Ankle plantarflexion         Ankle inversion         Ankle eversion          (Blank rows = not tested)   FUNCTIONAL TESTS:  5 times sit to stand: not able to due per pt request secondary to Lt shoulder  pain with sit to stand.  Max LT UE pain with sit to stand   Timed up and go (TUG): 1 min, 27 seconds     09/27/22: 5x sit to stand: 29 seconds with use of Lt hand  TUG: 27 seconds   10/30/22: 5x sit to stand: 23 sec use of LT hand     TUG: 26 sec with standard cane GAIT: Distance walked: 25 feet  Assistive device utilized: Hemi walker on Lt Level of assistance: SBA Comments: slow mobility, reduced trunk rotation, reduce Rt LE stance time and reduced Rt arm swing     TODAY'S TREATMENT:     10/30/22: Nustep L3 6 min with PTA present to monitor and discuss progress  MMT, TUG, 5x Sit to stand LAQ 2.5# LT 2x10, RT 10x with PTA providing TC to relax RT UT Standing marching 10x, changed HEP to include standing marching instead of seated. TA: Getting in/out of the truck with less assistance from her husband: 3x using LT UE and LE  10/23/22: Nustep L3 6 min with PTA present to monitor and discuss progress  LAQ 2.5# 10x Bil, feet on purple mat. VC to control RTLE Blu eloop clamshells x15 GAIT: 1 lap of building with standard cane and light CGA, a few short stops to catch breath due to faster pace.  Sit to stand 2x10  10/16/22:  Nustep L1 8 min with PTA present to monitor and discuss progress  LAQ: 3# 10x Lt 6x RT Seated clamshells blue loop 2x10, pt needs foot prop Sit to stand: 2x10 GAIT: 2 Full laps of building with straight cane and CGA. Pt had 1 episode where she leaned heavy RT but did not loose her balance.    PATIENT EDUCATION:  Education details: Access Code: [email or web address] Person educated: Patient Education method: Explanation, Media planner, and Handouts Education comprehension: verbalized understanding and returned demonstration   HOME EXERCISE PROGRAM: Access Code: [email or web address] URL: [email or web address] Date: 09/27/2022 Prepared by: Claiborne Billings  Exercises - Seated Long Arc Quad  - 3 x daily - 7 x weekly - 2 sets - 10 reps - 5 hold - Seated March   - 3 x daily - 7  x weekly - 3 sets - 10 reps - Seated Heel Toe Raises   - 3 x daily - 7 x weekly - 2 sets - 10 reps - Seated Isometric Hip Adduction with Ball  - 3 x daily - 7 x weekly - 2 sets - 10 reps - Sit to Stand with Armchair  - 2 x daily - 7 x weekly - 2 sets - 5-10 reps - Standing Hip Abduction with Counter Support  - 1 x daily - 7 x weekly - 2 sets - 10 reps - Heel Raises with Counter Support  - 2 x daily - 7 x weekly - 2 sets - 10 reps  Patient Education - Walking with a Single General [place] - Modified 4 Point Gait Pattern  ASSESSMENT:   CLINICAL IMPRESSION: TUG and 5x sit to stand continues to improve as did some of the MMT. Pt reports her confidence in trying activities is at an all time  high.  OBJECTIVE IMPAIRMENTS: Abnormal gait, decreased activity tolerance, decreased balance, decreased mobility, difficulty walking, decreased strength, decreased safety awareness, impaired perceived functional ability, impaired flexibility, postural dysfunction, and pain.    ACTIVITY LIMITATIONS: carrying, lifting, sitting, standing, stairs, transfers, hygiene/grooming, and locomotion level   PARTICIPATION LIMITATIONS: meal prep, cleaning, laundry, driving, shopping, and community activity   PERSONAL FACTORS: Age, Past/current experiences, and 1-2 comorbidities: CVA, falls, Rt hip fracture with ORIF  are also affecting patient's functional outcome.    REHAB POTENTIAL: Good   CLINICAL DECISION MAKING: Evolving/moderate complexity   EVALUATION COMPLEXITY: Moderate     GOALS: Goals reviewed with patient? Yes   SHORT TERM GOALS: Target date: 09/06/2022   Be independent in initial HEP Baseline: Goal status: Goal met 08/14/22   2.  Improve LE strength to perform sit to stand with moderate Lt UE support Baseline:  Goal status: Goal met 08/28/22  3.  Perform TUG in < or = to 60 seconds to reduce falls risk Baseline: 27 seconds (09/27/22) Goal status: MET          LONG TERM GOALS: Target date:   11/22/22   Be independent in advanced HEP Baseline: independent in current HEP and further progress is needed  Goal status: in progress    2.  Perform TUG in < or = to 15 seconds to reduce falls risk Baseline: 27 seconds (09/27/22) Goal status: REVISED    3.  Improve LE strength to perform sit to stand with min to no UE support  Baseline: min to moderate Lt UE support (09/27/22) Goal status: in progress    4.  Ambulate with hemi walker with supervision or no guard due to improved gait Baseline: able to do this Goal status: MET   5.  Demonstrate > or = to 4-/5 to 4/5 Rt hip and knee strength to improve safety and stability  Baseline: see above chart (09/27/22) Goal status: In progress   6. Ambulate with cane for all distances and demonstrate independence due to improved stability  Baseline: using hemiwalker  Goal status: INITIAL     PLAN:   PT FREQUENCY: 1-2x/week   PT DURATION: 8 weeks   PLANNED INTERVENTIONS: Therapeutic exercises, Therapeutic activity, Neuromuscular re-education, Balance training, Gait training, Patient/Family education, Self Care, Joint mobilization, Stair training, Dry Needling, Electrical stimulation, Cryotherapy, Moist heat, Taping, Manual therapy, and Re-evaluation   PLAN FOR NEXT SESSION: Continue to work on gait , strength and balance   Myrene Galas, PTA 10/30/22 3:15 PM

## 2022-10-30 ENCOUNTER — Ambulatory Visit: Payer: Medicare PPO | Admitting: Occupational Therapy

## 2022-10-30 ENCOUNTER — Ambulatory Visit: Payer: Medicare PPO | Admitting: Physical Therapy

## 2022-10-30 ENCOUNTER — Encounter: Payer: Self-pay | Admitting: Physical Therapy

## 2022-10-30 DIAGNOSIS — M79601 Pain in right arm: Secondary | ICD-10-CM

## 2022-10-30 DIAGNOSIS — M6281 Muscle weakness (generalized): Secondary | ICD-10-CM

## 2022-10-30 DIAGNOSIS — R293 Abnormal posture: Secondary | ICD-10-CM

## 2022-10-30 DIAGNOSIS — R2689 Other abnormalities of gait and mobility: Secondary | ICD-10-CM | POA: Diagnosis not present

## 2022-10-30 DIAGNOSIS — R278 Other lack of coordination: Secondary | ICD-10-CM | POA: Diagnosis not present

## 2022-10-30 DIAGNOSIS — I69354 Hemiplegia and hemiparesis following cerebral infarction affecting left non-dominant side: Secondary | ICD-10-CM

## 2022-10-30 DIAGNOSIS — R2681 Unsteadiness on feet: Secondary | ICD-10-CM | POA: Diagnosis not present

## 2022-10-30 NOTE — Therapy (Signed)
OUTPATIENT OCCUPATIONAL THERAPY  Treatment Session  Patient Name: Angela Buchanan MRN: [id number] DOB:1951-08-12, 72 y.o., female Today's Date: 10/30/2022  PCP: Glenis Smoker, MD REFERRING PROVIDER: Glenis Smoker, MD       END OF SESSION:  OT End of Session - 10/30/22 1358     Visit Number 13    Number of Visits 17    Date for OT Re-Evaluation 11/10/22    Authorization Type [employer]    OT Start Time 8016    OT Stop Time 1442    OT Time Calculation (min) 45 min                        Past Medical History:  Diagnosis Date   Atypical chest pain 07/19/2021   Bigeminy    no current med.   Breast cancer ([place]) 06/2017   right   Bruises easily    Carotid bruit 11/13/2019   Dental crowns present    History of diverticulitis    Hypertension    states under control with med., has been on med. x 5 yr.   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   Past Surgical History:  Procedure Laterality Date   ABDOMINAL HYSTERECTOMY     partial   BREAST LUMPECTOMY Right 06/22/2017   Malignant   BREAST LUMPECTOMY WITH RADIOACTIVE SEED AND SENTINEL LYMPH NODE BIOPSY Right 06/22/2017   Procedure: RIGHT BREAST LUMPECTOMY WITH RADIOACTIVE SEED AND RIGHT AXILLARY DEEP SENTINEL LYMPH NODE BIOPSY WITH BLUE DYE INJECTION;  Surgeon: Fanny Skates, MD;  Location: [facility] [facility];  Service: General;  Laterality: Right;   BREAST LUMPECTOMY WITH RADIOACTIVE SEED LOCALIZATION Left 06/22/2017   Procedure: LEFT BREAST LUMPECTOMY WITH RADIOACTIVE SEED LOCALIZATION;  Surgeon: Fanny Skates, MD;  Location: [facility];  Service: General;  Laterality: Left;   CATARACT EXTRACTION W/ INTRAOCULAR LENS   IMPLANT, BILATERAL Bilateral    EXCISION OF BREAST BIOPSY Left 06/22/2017   benign   LOOP RECORDER INSERTION N/A 05/16/2022   Procedure: LOOP RECORDER INSERTION;  Surgeon: Vickie Epley, MD;  Location: [facility];  Service: Cardiovascular;  Laterality: N/A;   PERCUTANEOUS PINNING Right 06/22/2022   Procedure: PERCUTANEOUS PINNING OF RIGHT HIP;  Surgeon: Hiram Gash, MD;  Location: [facility];  Service: Orthopedics;  Laterality: Right;   TONSILLECTOMY     age 36   Patient Active Problem List   Diagnosis Date Noted   Malnutrition of moderate degree 06/22/2022   Closed fracture of femur, neck ([place]) 06/21/2022   Urinary frequency 06/21/2022   Hypokalemia 06/21/2022   Heart murmur 06/21/2022   Hypothyroidism 06/21/2022   Dysphagia 05/17/2022   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   CVA (cerebral vascular accident) ([place]) 05/12/2022   Osteoporosis 01/24/2022   Atypical chest pain 07/19/2021   Temporomandibular joint (TMJ) pain 07/16/2020   Referred otalgia of both ears 07/16/2020   PAC (premature atrial contraction) 05/11/2020   PVC (premature ventricular contraction) 05/11/2020   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   Carotid bruit 11/13/2019   Pure hypercholesterolemia 11/13/2019   Malignant neoplasm of upper-outer quadrant of right breast in female, estrogen receptor positive ([place]) 05/22/2017   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 Pt accompanied by: self and significant other (Husband, [name])  PERTINENT HISTORY: hypertension, breast cancer, hyperlipidemia, Lt hip fracture secondary to fall 06/2022, CVA 06/2022   PRECAUTIONS: Fall, Rt breast cancer with node removal, osteoporosis  WEIGHT BEARING RESTRICTIONS: No  PAIN:  Are you having pain? Yes: NPRS scale: 3-4/10 Pain location: R shoulder and upper arm Pain description: aching Aggravating factors: movement to arm Relieving factors: repositioning, pain meds  FALLS: Has patient fallen in last 6 months? Yes. Number of falls 1 fall, one week after d/c home from SNF  LIVING ENVIRONMENT: Lives with: lives with their spouse Lives in: House/apartment Stairs: Yes: External: 2 steps; on left going up Has following equipment at home: Gilford Rile - 2 wheeled, Hemi walker, Wheelchair (manual), shower chair, and Grab bars  PLOF: Independent and Independent with basic ADLs  PATIENT GOALS: to get use of my R arm again  OBJECTIVE:   HAND DOMINANCE: Right  ADLs: Transfers/ambulation related to ADLs: Mod I with hemi-walker Eating: using L hand for eating, husband is assisting with cutting up food Grooming: Mod I, using L hand UB Dressing: Mod assist LB Dressing: Max assist Toileting: Mod I Bathing: Supervision - Mod I Tub Shower transfers: Min assist Equipment: Shower seat with back and Grab bars  UPPER EXTREMITY ROM:    Active ROM Right eval Left eval Right 10/18/22   Shoulder flexion 54    Shoulder abduction     Shoulder adduction     Shoulder extension     Shoulder internal rotation     Shoulder external rotation     Elbow flexion 108    Elbow extension -24    Wrist flexion     Wrist extension     Wrist ulnar deviation     Wrist radial deviation     Wrist pronation     Wrist supination     (Blank rows = not tested)  Passive ROM Right eval Left eval Right 10/18/22  Shoulder flexion 65  76  Shoulder  abduction     Shoulder adduction     Shoulder extension     Shoulder internal rotation     Shoulder external rotation     Elbow flexion     Elbow extension -17  -15  Wrist flexion 46  56  Wrist extension 27  50  Wrist ulnar deviation     Wrist radial deviation     Wrist pronation     Wrist supination     (Blank rows = not tested)  Passive ROM Right 10/18/22  Thumb MCP (0-60)   Thumb IP (0-80)   Thumb Radial abd/add (0-55)   Thumb Palmar abd/add (0-45)   Thumb Opposition to Small Finger   Index MCP (0-90)   Index PIP (0-100)   Index DIP (0-70)    Long MCP (0-90)    Long PIP (0-100) -30   Long DIP (0-70) -20  Ring MCP (0-90)    Ring PIP (0-100) -25  Ring DIP (0-70) -15   Little MCP (0-90)    Little PIP (0-100)    Little DIP (0-70)    (Blank rows = not tested)   UPPER EXTREMITY MMT:   not assessed due to pain with any PROM, AROM    HAND FUNCTION: Loose gross grasp   COORDINATION: Box and Blocks:  Right 2blocks, Left 45 blocks. Modified to complete with RUE.  Pt able to grasp and move across to other side of box with barrier removed.    SENSATION: Mild decrease in R  EDEMA: mild edema in wrist hand, and fingers on RUE  OBSERVATIONS: Pt guarded with movement, hypersensitive to touch and PROM.     TODAY'S TREATMENT:                                   10/30/22 Splint fit: Pt with no c/o redness or areas of concern with wear of resting hand splint.  Pt reports unable to tolerate wear overnight yet, due to onset of pain with prolonged stretch.  Encouraged increased wear as tolerated to progress to overnight as able. Reiterated ability to modify fit of splint as able. Pegs: AAROM include shoulder flexion, elbow extension, and wrist flexion during grasp and placement of large pegs into peg board.  OT providing tactile cues at torso to facilitate midline sitting balance to eliminate compensatory trunk movements as utilized to increase functional reach.  Pt demonstrating  active wrist extension to midline with placement and removal of pegs.  Pt demonstrating loose gross grasp and some tripod grasp as needed for handwriting and self-feeding.  OT providing tactile and verbal cues to facilitate increased attempts at hand opening. Discussed PM&R and neurology for treatment of hypertonicity.  Pt asking questions about full/complete functional return of dominant RUE.  Discussed possibility of medication and/or alternative treatments to aid in treatment of hypertonicity.  Pt would require referral from PCP to establish pt with neurologist and/or PM&R. Handwriting:  OT educated on use of built up handle for handwriting and self-feeding.  Pt able to achieve tripod grasp to stabilize pen with built up handle.  Pt demonstrating decreased grasp strength and coordination to write name fully legibly.  OT provided pt with built up handle and pre-writing handouts to practice.  Encouraged pt to utilize RUE intermittently as able with self-feeding, with use of utensil with built up handle or for finger foods.    10/25/22 ADL: Educated on current progress towards ordering splint vs custom fabrication of splint during session.  Pt and spouse verbalizing frustration with need to go somewhere else to order a prefabricated splint, due to fact that therapy clinic is not a DME provider.  Discussed OT able to fabricate a splint to meet pt's particular/specific needs.  Pt agreeable to attempt.  Educated on splint wear and care with focus on initially wearing only 2 hours and assessing skin for redness or irritation after wear for 2 hours.  Discussed progression of splint wear after 2-3 days to 4 hours.  Pt and spouse verbalizing understanding of wear schedule and want to look for.  Pt able to doff and don with min cues when donning to ensure proper positioning of wrist to allow for extension at finger tips. Table slides: pt engaging in table slides with focus on shoulder flexion and horizontal  abduction/adduction intermittently during splint fabrication. Splint fabrication: OT cut out pattern from previous session and utilized [employer]  perforated Splinting Material to fabricate resting hand splint to facilitate increased extension and address contractures in long and ring fingers.   OT applied straps to resting hand splint at DIP, wrist, and forearm to facilitate appropriate fit.     10/23/22 UE exercise: Chest press and shoulder flexion with 1# dowel x10.  OT providing min cues and tactile cues to address elbow extension during overhead reach.  Engaged in horizontal abduction with 1# dowel x10 with focus on ROM and stretch due to tightness in pecs.  OT facilitated abduction exercise with "chicken wing" with focus on pt holding position in abduction for 5-10 seconds for additional stretch.  Fading from PROM to [facility].  Engaged in [place] chest press with focus on quality of movement and controlled descent.   Transitional movements: OT educated on bed mobility as pt reporting difficulty when attempting to sit up from supine.  OT demonstrated and educated on long rolling and pushing up with LUE while advancing LB over edge of mat.  Pt will continue to benefit from practice of technique. Finger/wrist extension: OT provided multimodal cues for caregiver assisted PROM providing initial demonstration to verbal cues while spouse demonstrated technique.  Therapist provided additional written cues for technique to ensure proper positioning and technique.  Pt demonstrating PROM finger extension followed by flexion with OT providing cues to ensure flexion as well as extension during HEP.   PATIENT EDUCATION: Education details: educated on splint wear and care and to look for skin break down Person educated: Patient and Spouse Education method: Explanation, Media planner, Corporate treasurer cues, Verbal cues, and Handouts Education comprehension: verbalized understanding and needs further  education  HOME EXERCISE PROGRAM: Access Code: [email or web address] URL: [email or web address] Date: 10/23/2022 Prepared by: [facility]  Exercises - Supine Shoulder Flexion AAROM with Dowel  - 1 x daily - 3 x weekly - 2 sets - 10 reps - Supine Shoulder Protraction with Dowel  - 1 x daily - 3 x weekly - 2 sets - 10 reps - Supine Shoulder Abduction AAROM with Dowel  - 1 x daily - 3 x weekly - 2 sets - 10 reps - Standing Single Bent Arm Shoulder Abduction   - 1 x daily - 3 x weekly - 2 sets - 10 reps - 5-10 sec hold - Hooklying Single Arm Chest Press  - 1 x daily - 3 x weekly - 2 sets - 10 reps - Seated Forearm Pronation Supination AROM  - 1 x daily - 7 x weekly - 2 sets - 10 reps - Hand PROM Finger Extension  - 1 x daily - 7 x weekly - 2 sets - 10 reps - Thumb AROM Opposition  - 1 x daily - 7 x weekly - 1 sets - 5 reps - Seated Isolated Finger PIP Flexion AROM  - 1 x daily - 7 x weekly - 1 sets - 5 reps - Seated Finger Composite Flexion Stretch  - 1 x daily - 7 x weekly - 1 sets - 5 reps - Wrist Flexion and Extension Caregiver PROM  - 1 x daily - 7 x weekly - 2 sets - 10 reps - 5-10 sec hold   GOALS: Goals reviewed with patient? Yes  SHORT TERM GOALS: Target date: 10/13/22  Pt and spouse will be independent with ROM and coordination HEP. Baseline: Goal status: MET - 10/16/22  2.  Pt and spouse will verbalize understanding of supine and sitting positioning to decrease edema and pain  in [place]. Baseline:  Goal status: MET - 10/16/22  3.  Pt will verbalize understanding of task modifications and/or potential AE needs to increase ease, safety, and independence w/ ADLs. Baseline:  Goal status: IN PROGRESS  4.  Pt will be able to complete UB dressing with min assist demonstrating/verbalizing understanding of hemi-dressing technique. Baseline:  Goal status: MET - 10/16/22    LONG TERM GOALS: Target date: 11/10/22  Pt will be able to complete UB  dressing with Supervision assist (to include donning/fastening bra PRN) demonstrating/verbalizing understanding of hemi-dressing technique. Baseline:  Goal status: IN PROGRESS  2.  Pt will be able to incorporate use of RUE at non-dominant level during LB dressing, to be completed at Mod I level. Baseline:  Goal status: IN PROGRESS  3.  Pt will demonstrate ability to pick up light weight object from table top as needed to increase engagement of RUE into ADLs and self-eeding. Baseline:  Goal status: IN PROGRESS  4.  Pt will demonstrate improved UE functional use for ADLs as evidenced by increasing box/ blocks score by 5 blocks with RUE, to be completed in standardized manner. Baseline: Right 2blocks, Left 45 blocks. Modified to complete with RUE.  Pt able to grasp and move across to other side of box with barrier removed.   Goal status: IN PROGRESS  5.  Pt will report decreased pain in dominant RUE interfering with ADLs/IADLs by decreased impairment score on Quick DASH. Baseline: 77% Goal status: IN PROGRESS  6.  Pt will be independent with splint wear and care PRN Baseline: splint needs TBD Goal status: IN PROGRESS  ASSESSMENT:  CLINICAL IMPRESSION: Pt would benefit from neurology and/or PM&R for further treatment of hypertonicity and to further educate on stroke risk and treatment options. OT educating on typical rehab process and frequency of onset of hypertonicity post CVA. Pt demonstrating functional grasp to pick up large pegs progressing to tripod grasp to allow attempts at handwriting with built up handle.  Pt demonstrating decreased grip strength and manipulation as needed for handwriting.  Pt continues to be limited by decreased shoulder flexion, elbow flexion/extension, and finger extension due to hypertonicity impacting ADLs and IADLs.  PERFORMANCE DEFICITS: in functional skills including ADLs, IADLs, coordination, sensation, edema, tone, ROM, strength, pain, flexibility, Fine  motor control, Gross motor control, balance, body mechanics, endurance, decreased knowledge of use of DME, skin integrity, vision, and UE functional use, cognitive skills including emotional and safety awareness, and psychosocial skills including coping strategies.   IMPAIRMENTS: are limiting patient from ADLs, IADLs, and rest and sleep.   CO-MORBIDITIES: may have co-morbidities  that affects occupational performance. Patient will benefit from skilled OT to address above impairments and improve overall function.  MODIFICATION OR ASSISTANCE TO COMPLETE EVALUATION: Min-Moderate modification of tasks or assist with assess necessary to complete an evaluation.  OT OCCUPATIONAL PROFILE AND HISTORY: Detailed assessment: Review of records and additional review of physical, cognitive, psychosocial history related to current functional performance.  CLINICAL DECISION MAKING: Moderate - several treatment options, min-mod task modification necessary  REHAB POTENTIAL: Good  EVALUATION COMPLEXITY: Moderate    PLAN:  OT FREQUENCY: 2x/week  OT DURATION: 8 weeks  PLANNED INTERVENTIONS: self care/ADL training, therapeutic exercise, therapeutic activity, neuromuscular re-education, manual therapy, passive range of motion, balance training, functional mobility training, splinting, paraffin, fluidotherapy, biofeedback, compression bandaging, moist heat, cryotherapy, contrast bath, patient/family education, visual/perceptual remediation/compensation, psychosocial skills training, coping strategies training, and DME and/or AE instructions  RECOMMENDED OTHER SERVICES: NA  CONSULTED AND  AGREED WITH PLAN OF CARE: Patient and family member/caregiver  PLAN FOR NEXT SESSION: Review splint fit and make modification as needed.  AAROM or self-ROM to include shoulder, elbow, forearm at next session.  Continue to focus on PROM, massage, and stretching.  Functional grasp and release.  Attempt handwriting and/or  self-feeding.   Simonne Come, OTR/L 10/30/2022, 4:34 PM

## 2022-10-31 ENCOUNTER — Encounter (HOSPITAL_BASED_OUTPATIENT_CLINIC_OR_DEPARTMENT_OTHER): Payer: Self-pay | Admitting: Cardiovascular Disease

## 2022-10-31 ENCOUNTER — Ambulatory Visit (HOSPITAL_BASED_OUTPATIENT_CLINIC_OR_DEPARTMENT_OTHER): Payer: Medicare PPO | Admitting: Cardiovascular Disease

## 2022-10-31 VITALS — BP 166/66 | HR 60 | Ht 62.0 in | Wt 98.1 lb

## 2022-10-31 DIAGNOSIS — I10 Essential (primary) hypertension: Secondary | ICD-10-CM | POA: Diagnosis not present

## 2022-10-31 DIAGNOSIS — I491 Atrial premature depolarization: Secondary | ICD-10-CM

## 2022-10-31 DIAGNOSIS — E7800 Pure hypercholesterolemia, unspecified: Secondary | ICD-10-CM | POA: Diagnosis not present

## 2022-10-31 DIAGNOSIS — I639 Cerebral infarction, unspecified: Secondary | ICD-10-CM | POA: Diagnosis not present

## 2022-10-31 DIAGNOSIS — I2510 Atherosclerotic heart disease of native coronary artery without angina pectoris: Secondary | ICD-10-CM | POA: Diagnosis not present

## 2022-10-31 HISTORY — DX: Atherosclerotic heart disease of native coronary artery without angina pectoris: I25.10

## 2022-10-31 MED ORDER — AMLODIPINE BESYLATE 10 MG PO TABS: 10.0000 mg | ORAL_TABLET | Freq: Every day | ORAL | 3 refills | Status: DC

## 2022-10-31 NOTE — Assessment & Plan Note (Addendum)
Blood pressure is uncontrolled in the office today and has been uncontrolled on other visits as well.  She has not been checking it at home recently.  She also reports some symptoms concerning for orthostasis.  However there was no orthostatic in the office today.  Blood pressures were all elevated.  Continue lisinopril and metoprolol.  Increase amlodipine to 10 mg.

## 2022-10-31 NOTE — Patient Instructions (Addendum)
Medication Instructions:  INCREASE YOUR AMLODIPINE TO 10 MG DAILY    *If you need a refill on your cardiac medications before your next appointment, please call your pharmacy*  Lab Work: LP/CMET TODAY   If you have labs (blood work) drawn today and your tests are completely normal, you will receive your results only by: [place] (if you have MyChart) OR A paper copy in the mail If you have any lab test that is abnormal or we need to change your treatment, we will call you to review the results.  Testing/Procedures: NONE   Follow-Up: At [facility], you and your health needs are our priority.  As part of our continuing mission to provide you with exceptional heart care, we have created designated Provider Care Teams.  These Care Teams include your primary Cardiologist (physician) and Advanced Practice Providers (APPs -  Physician Assistants and Nurse Practitioners) who all work together to provide you with the care you need, when you need it.  We recommend signing up for the patient portal called "MyChart".  Sign up information is provided on this After Visit Summary.  MyChart is used to connect with patients for Virtual Visits (Telemedicine).  Patients are able to view lab/test results, encounter notes, upcoming appointments, etc.  Non-urgent messages can be sent to your provider as well.   To learn more about what you can do with MyChart, go to [email or web address].    Your next appointment:   3-4 month(s)  Provider:   Skeet Latch, MD or Laurann Montana, NP    Other Instructions DRINK A BOOST OR ENSURE DAILY

## 2022-10-31 NOTE — Assessment & Plan Note (Signed)
She has nonobstructive CAD.  She has no ischemic symptoms.  Continue aspirin, clopidogrel, amlodipine, and metoprolol.  Addressing lipids as above.

## 2022-10-31 NOTE — Assessment & Plan Note (Signed)
Not currently symptomatic.  Continue metoprolol.

## 2022-10-31 NOTE — Progress Notes (Signed)
Cardiology Office Note  Date:  10/31/2022   ID:  Angela, Buchanan 1951-02-17, MRN [id number]  PCP:  Glenis Smoker, MD  Cardiologist:   Skeet Latch, MD   No chief complaint on file.  History of Present Illness: Angela Buchanan is a 72 y.o. female with Celiac disease, breast cancer s/p lumpectomy and XRT, R subclavian DVT here for follow-up.  In 2017 Angela Buchanan was seen for palpitations.  Angela Buchanan wore an ambulatory monitor that revealed sinus rhythm with frequent PVCs.  Angela Buchanan also had ventricular bigeminy and trigeminy.  Angela Buchanan had a Lexiscan Myoview in 2015 that was negative for ischemia.  It was not gated due to PVCs. Angela Buchanan was again seen 11/2019 for the evaluation of palpitations and CV risk assessment.  Her blood pressure is elevated so lisinopril was increased to '40mg'$ .  Lipids were elevated so rosuvastatin was started 11/2019.  Angela Buchanan developed severe muscle discomfort. Angela Buchanan was asked to reduce the dose and take it every other day.  Angela Buchanan wore a 30-day monitor that revealed some PACs and PVCs but was otherwise unremarkable.  Angela Buchanan was started on metoprolol.  Angela Buchanan had an echo 12/2019 that revealed normal EF and grade 1 diastolic dysfunction.  Carotid Dopplers were normal.  Since starting the metoprolol Angela Buchanan has been feeling much better.  Angela Buchanan followed up with our pharmacist and her BP was poorly controlled.  HCTZ was increased and metoprolol was reduced.    Angela Buchanan developed hyponatremia and poorly controlled blood pressure. Hydrochlorothiazide was discontinued and Angela Buchanan was started on amlodipine.  Her BP was then labile ranging from 100/72 to 174/84.  Angela Buchanan was under a lot of stress caring for her husband was stage IV cancer.  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  Sometimes the pain is so excruciating Angela Buchanan wants to pull her fingers off.  Angela Buchanan had an episode of chest pain and shortness of breath and was referred for [facility] 10/2020 that revealed  LVEF 70% with a fixed perfusion defect at the apex, apical anterior, lateral, and inferior walls not to be artifact.  There is no ischemia.  Metoprolol was increased. Angela Buchanan was not taking her HCTZ. Angela Buchanan noted her blood pressure was controlled at home but not in the office.  Angela Buchanan was switched to pravastatin due to myalgias on rosuvastatin.   At her last appointment Angela Buchanan continued to have joint pain despite stopping her pravastatin one month prior. Angela Buchanan complained of occasional sharp chest pain. Home blood pressures were averaging [id number] systolic. We restarted pravastatin and added amlodipine 5 mg daily. Angela Buchanan had a coronary CTA 08/2021 revealing a coronary calcium score of 14.4 (47th percentile), minimal plaque in the PDA, and mild aortic atherosclerosis. LCx appeared congenitally absent. Angela Buchanan was admitted 05/2022 with cryptogenic stroke. Echo revealed LVEF 60-65% with normal diastolic function. An ILR was implanted and no arrhythmias have been detected.   Today, Angela Buchanan is accompanied by a family member. Angela Buchanan is generally feeling well aside from residual right-sided hemiparesis and tremors. Currently Angela Buchanan is participating in physical therapy. In the past few months Angela Buchanan has noticed improvement in the ambulation of her right fingers. Angela Buchanan is also able to walk. Occasionally, Angela Buchanan is aware of slumping forwards when initially standing up. Angela Buchanan uses a walker for assistance. Sometimes Angela Buchanan experiences lightheadedness or dizziness. Her blood pressure is elevated in clinic today at 156/66, and 166/66 on recheck. At a recent visit to Dr.  Timberlake's office her blood pressure was found to be 136/71 via manual check. Lately Angela Buchanan has not had much of an appetite, with weight loss. Angela Buchanan has not yet wanted to eat anything today. Angela Buchanan denies any palpitations, chest pain, shortness of breath, or peripheral edema. No headaches, syncope, orthopnea, or PND.   Past Medical History:  Diagnosis Date   Atypical chest pain 07/19/2021   Bigeminy     no current med.   Breast cancer ([place]) 06/2017   right   Bruises easily    CAD in native artery 10/31/2022   Carotid bruit 11/13/2019   Dental crowns present    History of diverticulitis    Hypertension    states under control with med., has been on med. x 5 yr.   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    Past Surgical History:  Procedure Laterality Date   ABDOMINAL HYSTERECTOMY     partial   BREAST LUMPECTOMY Right 06/22/2017   Malignant   BREAST LUMPECTOMY WITH RADIOACTIVE SEED AND SENTINEL LYMPH NODE BIOPSY Right 06/22/2017   Procedure: RIGHT BREAST LUMPECTOMY WITH RADIOACTIVE SEED AND RIGHT AXILLARY DEEP SENTINEL LYMPH NODE BIOPSY WITH BLUE DYE INJECTION;  Surgeon: Fanny Skates, MD;  Location: [facility];  Service: General;  Laterality: Right;   BREAST LUMPECTOMY WITH RADIOACTIVE SEED LOCALIZATION Left 06/22/2017   Procedure: LEFT BREAST LUMPECTOMY WITH RADIOACTIVE SEED LOCALIZATION;  Surgeon: Fanny Skates, MD;  Location: [facility];  Service: General;  Laterality: Left;   CATARACT EXTRACTION W/ INTRAOCULAR LENS  IMPLANT, BILATERAL Bilateral    EXCISION OF BREAST BIOPSY Left 06/22/2017   benign   LOOP RECORDER INSERTION N/A 05/16/2022   Procedure: LOOP RECORDER INSERTION;  Surgeon: Vickie Epley, MD;  Location: [facility];  Service: Cardiovascular;  Laterality: N/A;   PERCUTANEOUS PINNING Right 06/22/2022   Procedure: PERCUTANEOUS PINNING OF RIGHT HIP;  Surgeon: Hiram Gash, MD;  Location: [facility];  Service: Orthopedics;  Laterality: Right;   TONSILLECTOMY     age 66     Current Outpatient Medications  Medication Sig Dispense Refill   aspirin EC 81 MG tablet Take 1 tablet (81 mg  total) by mouth daily. Swallow whole. 30 tablet 12   calcium-vitamin D (OSCAL WITH D) 500-5 MG-MCG tablet Take 1 tablet by mouth daily with breakfast. 30 tablet 0   cetirizine (ZYRTEC) 10 MG tablet Take 10 mg by mouth daily.     Cholecalciferol (VITAMIN D) 50 MCG (2000 UT) tablet Take 2,000 Units by mouth daily.     clobetasol ointment (TEMOVATE) 0.05 % Apply topically 2 (two) times daily as needed (itching). (Patient taking differently: Apply 1 Application topically 2 (two) times daily as needed (itching).) 30 g 0   clopidogrel (PLAVIX) 75 MG tablet Take 1 tablet (75 mg total) by mouth daily. 18 tablet 0   docusate sodium (COLACE) 100 MG capsule Take 1 capsule (100 mg total) by mouth 2 (two) times daily. 10 capsule 0   feeding supplement (ENSURE ENLIVE / ENSURE PLUS) LIQD Take 237 mLs by mouth 2 (two) times daily between meals. 237 mL 12   ferrous sulfate 325 (65 FE) MG tablet Take 1 tablet (325 mg total) by mouth 2 (two) times daily with a  meal. 90 tablet 3   fluticasone (FLONASE) 50 MCG/ACT nasal spray Place 1 spray into both nostrils daily as needed for allergies or rhinitis.     HYDROcodone-acetaminophen (NORCO) 5-325 MG tablet Take 1 tablet by mouth every 6 (six) hours as needed for severe pain. 30 tablet 0   hydrOXYzine (ATARAX) 25 MG tablet Take 25 mg by mouth 3 (three) times daily as needed for nausea or vomiting.     levothyroxine (SYNTHROID) 25 MCG tablet Take 1 tablet (25 mcg total) by mouth daily at 6 (six) AM. 30 tablet 0   lisinopril (ZESTRIL) 40 MG tablet Take 40 mg by mouth daily.     meclizine (ANTIVERT) 25 MG tablet Take 25 mg by mouth daily as needed for dizziness.     methocarbamol (ROBAXIN) 500 MG tablet Take 1 tablet (500 mg total) by mouth every 6 (six) hours as needed for muscle spasms. 30 tablet 0   metoprolol tartrate (LOPRESSOR) 25 MG tablet Take 25 mg by mouth every evening.     Multiple Vitamins-Minerals (CENTRUM SILVER 50+WOMEN PO) Take 1 tablet by mouth daily.      nystatin (MYCOSTATIN) 100000 UNIT/ML suspension Take 5 mLs (500,000 Units total) by mouth 4 (four) times daily. 60 mL 1   oxyCODONE-acetaminophen (PERCOCET) 5-325 MG tablet Take 1-2 tablets by mouth every 6 (six) hours as needed. 20 tablet 0   pantoprazole (PROTONIX) 40 MG tablet Take 1 tablet (40 mg total) by mouth daily at 12 noon. 30 tablet 0   polyethylene glycol (MIRALAX / GLYCOLAX) 17 g packet Take 17 g by mouth daily as needed for mild constipation. 14 each 0   pravastatin (PRAVACHOL) 40 MG tablet Take 1 tablet (40 mg total) by mouth daily. 30 tablet 0   senna (SENOKOT) 8.6 MG TABS tablet Take 2 tablets (17.2 mg total) by mouth daily as needed for mild constipation. 120 tablet 0   amLODipine (NORVASC) 10 MG tablet Take 1 tablet (10 mg total) by mouth daily. 90 tablet 3   No current facility-administered medications for this visit.    Allergies:   Atenolol, Bisoprolol, Prednisone, Amoxicillin, Amoxicillin-pot clavulanate, Hctz [hydrochlorothiazide], Rosuvastatin, and Latex    Social History:  The patient  reports that Angela Buchanan has never smoked. Angela Buchanan has never used smokeless tobacco. Angela Buchanan reports that Angela Buchanan does not drink alcohol and does not use drugs.   Family History:  The patient's family history includes Dementia in her brother and father; Heart attack in her brother; Hypertension in her sister; Stroke in her brother, maternal grandfather, maternal grandmother, mother, paternal grandfather, paternal grandmother, and sister.    ROS:   Please see the history of present illness.   (+) Right-sided weakness (+) Tremors (+) Loss of appetite (+) Lightheadedness/dizziness All other systems are reviewed and negative.    PHYSICAL EXAM: VS:  BP (!) 166/66 (BP Location: Left Arm, Patient Position: Sitting, Cuff Size: Normal)   Pulse 60   Ht '5\' 2"'$  (1.575 m)   Wt 98 lb 1.6 oz (44.5 kg)   LMP  (LMP Unknown)   SpO2 98%   BMI 17.94 kg/m  , BMI Body mass index is 17.94 kg/m. GENERAL:  Well  appearing HEENT: Pupils equal round and reactive, fundi not visualized, oral mucosa unremarkable NECK:  No jugular venous distention, waveform within normal limits, carotid upstroke brisk and symmetric, no bruits LUNGS:  Clear to auscultation bilaterally HEART:  RRR.  PMI not displaced or sustained,S1 and S2 within normal limits, no S3, no S4,  no clicks, no rubs, no murmurs ABD:  Flat, positive bowel sounds normal in frequency in pitch, no bruits, no rebound, no guarding, no midline pulsatile mass, no hepatomegaly, no splenomegaly EXT:  2 plus pulses throughout, no edema, no cyanosis no clubbing SKIN:  No rashes no nodules NEURO:  Cranial nerves II through XII grossly intact.  R sided weakness PSYCH:  Cognitively intact, oriented to person place and time  EKG:  EKG is personally reviewed. 10/31/2022:  EKG was not ordered. 07/19/2021: Sinus bradycardia Rate 52 bpm 11/14/19 sinus rhythm.  Rate 62 bpm. Nonspecific ST changes.   Loop Recorder Insertion  05/16/2022: CONCLUSIONS:   1. Successful implantation of a implantable loop recorder for Cryptogenic stroke  2. No early apparent complications.   Echo 05/13/2022:  1. Left ventricular ejection fraction, by estimation, is 60 to 65%. The  left ventricle has normal function. The left ventricle has no regional  wall motion abnormalities. Left ventricular diastolic parameters were  normal.   2. Right ventricular systolic function is normal. The right ventricular  size is normal. Tricuspid regurgitation signal is inadequate for assessing  PA pressure.   3. The mitral valve is grossly normal. Trivial mitral valve  regurgitation. No evidence of mitral stenosis.   4. The aortic valve is tricuspid. Aortic valve regurgitation is not  visualized. No aortic stenosis is present.   5. The inferior vena cava is normal in size with greater than 50%  respiratory variability, suggesting right atrial pressure of 3 mmHg.   Conclusion(s)/Recommendation(s): No  intracardiac source of embolism  detected on this transthoracic study. Consider a transesophageal  echocardiogram to exclude cardiac source of embolism if clinically  indicated.   Coronary CTA  08/02/2021: IMPRESSION: 1. Coronary calcium score of 14.4. This was 45 percentile for age-, sex, and race-matched controls.   2. Normal coronary origin with right dominance; congenitally absent left circumflex; RCA gives off PDA and 2 posterolateral branches and terminates in the left circumflex distribution.   3. Minimal plaque in the PDA.   4. Mild aortic atherosclerosis.   RECOMMENDATIONS: CAD-RADS 1: Minimal non-obstructive CAD (0-24%). Consider non-atherosclerotic causes of chest pain. Consider preventive therapy and risk factor modification.  Lexiscan Myoview 10/2020: There was no significant ST segment deviation noted during stress. <24m ST depression in V3/4 The left ventricular ejection fraction is hyperdynamic (>65%). Nuclear stress EF: 70%. Defect 1: There is a small defect of moderate severity present in the apical anterior, apical inferior, apical lateral and apex location. The study is normal. This is a low risk study.   1. Moderate fixed perfusion defect at apex and apical anterior/lateral/inferior walls, with normal wall motion in this area, suggestive of artifact 2. Low risk study  30 Day Event Monitor 01/2020: Quality: Fair.  Baseline artifact. Predominant rhythm: sinus rhythm Average heart rate: 69 bpm Max heart rate: 131 bpm Min heart rate: 47 bpm Pauses >2.5 seconds: none   Patient did submit a symptom diary.  Angela Buchanan reported multiple episodes of chest pain and fluttering.  At which time most of the time sinus rhythm with noted.  Angela Buchanan did have occasional PACs or PVCs. 4 beats of atrial tachycardia.  Carotid Doppler 12/03/19: Normal  Echo 12/02/19:  1. Left ventricular ejection fraction, by estimation, is 55 to 60%. The  left ventricle has normal function. The left  ventricle has no regional  wall motion abnormalities. Left ventricular diastolic parameters are  consistent with Grade I diastolic  dysfunction (impaired relaxation). The average left ventricular global  longitudinal strain is -20.4 %.   2. Right ventricular systolic function is normal. The right ventricular  size is normal.   3. The mitral valve is abnormal. Trivial mitral valve regurgitation.   4. The aortic valve is tricuspid. Aortic valve regurgitation is not  visualized.   5. The inferior vena cava is normal in size with greater than 50%  respiratory variability, suggesting right atrial pressure of 3 mmHg.   Ambulatory monitor 08/17/16: NSR with PVCs, trigeminy, bigeminy intermittently. No AFib or pathologic tachyarrhythmias noted.   Lexiscan Myoview 07/30/14:  Scattered PVCs.  No ischemia.  Not gated due to frequent ectopy.   Recent Labs: 06/22/2022: ALT 20 06/23/2022: Magnesium 1.9 06/24/2022: BUN 13; Creatinine, Ser 0.59; Hemoglobin 10.1; Platelets 395; Potassium 3.9; Sodium 139    Lipid Panel    Component Value Date/Time   CHOL 264 (H) 05/13/2022 0137   CHOL 174 01/14/2020 1535   TRIG 56 05/13/2022 0137   HDL 86 05/13/2022 0137   HDL 75 01/14/2020 1535   CHOLHDL 3.1 05/13/2022 0137   VLDL 11 05/13/2022 0137   LDLCALC 167 (H) 05/13/2022 0137   LDLCALC 72 01/14/2020 1535      Wt Readings from Last 3 Encounters:  10/31/22 98 lb 1.6 oz (44.5 kg)  07/30/22 100 lb (45.4 kg)  06/22/22 108 lb 14.5 oz (49.4 kg)      ASSESSMENT AND PLAN:  Stroke (cerebrum) (HCC) Cryptogenic stroke.  ILR implanted and no arrhythmias have been detected.  Continue aspirin, clopidogrel, and pravastatin.  Check fasting lipids/CMP.  PAC (premature atrial contraction) Not currently symptomatic.  Continue metoprolol.  Pure hypercholesterolemia Lipids have been uncontrolled.  Angela Buchanan has had trouble with several statins.  Angela Buchanan is tolerating pravastatin well.  Check fasting lipids and a CMP.   If her lipids remain above goal we will need to consider adding a PCSK9 inhibitor.  CAD in native artery Angela Buchanan has nonobstructive CAD.  Angela Buchanan has no ischemic symptoms.  Continue aspirin, clopidogrel, amlodipine, and metoprolol.  Addressing lipids as above.  Essential hypertension Blood pressure is uncontrolled in the office today and has been uncontrolled on other visits as well.  Angela Buchanan has not been checking it at home recently.  Angela Buchanan also reports some symptoms concerning for orthostasis.  However there was no orthostatic in the office today.  Blood pressures were all elevated.  Continue lisinopril and metoprolol.  Increase amlodipine to 10 mg.     Disposition:   FU with Satish Hammers C. Oval Linsey, MD, [facility] in 3-4 months.   Medication Adjustments/Labs and Tests Ordered: Current medicines are reviewed at length with the patient today.  Concerns regarding medicines are outlined above.   Orders Placed This Encounter  Procedures   Lipid panel   Comprehensive metabolic panel   Meds ordered this encounter  Medications   amLODipine (NORVASC) 10 MG tablet    Sig: Take 1 tablet (10 mg total) by mouth daily.    Dispense:  90 tablet    Refill:  3   I,Mathew Stumpf,acting as a scribe for Skeet Latch, MD.,have documented all relevant documentation on the behalf of Skeet Latch, MD,as directed by  Skeet Latch, MD while in the presence of Skeet Latch, MD.  I, [place] Oval Linsey, MD have reviewed all documentation for this visit.  The documentation of the exam, diagnosis, procedures, and orders on 10/31/2022 are all accurate and complete.  Signed, Aryon Nham C. Oval Linsey, MD, [facility]  10/31/2022 6:02 PM    [facility]

## 2022-10-31 NOTE — Assessment & Plan Note (Signed)
Cryptogenic stroke.  ILR implanted and no arrhythmias have been detected.  Continue aspirin, clopidogrel, and pravastatin.  Check fasting lipids/CMP.

## 2022-10-31 NOTE — Assessment & Plan Note (Signed)
Lipids have been uncontrolled.  She has had trouble with several statins.  She is tolerating pravastatin well.  Check fasting lipids and a CMP.  If her lipids remain above goal we will need to consider adding a PCSK9 inhibitor.

## 2022-11-01 ENCOUNTER — Ambulatory Visit: Payer: Medicare PPO | Admitting: Occupational Therapy

## 2022-11-01 DIAGNOSIS — I69354 Hemiplegia and hemiparesis following cerebral infarction affecting left non-dominant side: Secondary | ICD-10-CM

## 2022-11-01 DIAGNOSIS — R293 Abnormal posture: Secondary | ICD-10-CM | POA: Diagnosis not present

## 2022-11-01 DIAGNOSIS — M79601 Pain in right arm: Secondary | ICD-10-CM | POA: Diagnosis not present

## 2022-11-01 DIAGNOSIS — R278 Other lack of coordination: Secondary | ICD-10-CM

## 2022-11-01 DIAGNOSIS — R2681 Unsteadiness on feet: Secondary | ICD-10-CM | POA: Diagnosis not present

## 2022-11-01 DIAGNOSIS — M6281 Muscle weakness (generalized): Secondary | ICD-10-CM | POA: Diagnosis not present

## 2022-11-01 NOTE — Therapy (Signed)
OUTPATIENT OCCUPATIONAL THERAPY  Treatment Session  Patient Name: Angela Buchanan MRN: [id number] DOB:12/15/1950, 72 y.o., female 66 Date: 11/01/2022  PCP: Glenis Smoker, MD REFERRING PROVIDER: Glenis Smoker, MD       END OF SESSION:  OT End of Session - 11/01/22 1417     Visit Number 14    Number of Visits 17    Date for OT Re-Evaluation 11/10/22    Authorization Type [employer]    OT Start Time 8099    OT Stop Time 1447    OT Time Calculation (min) 40 min                         Past Medical History:  Diagnosis Date   Atypical chest pain 07/19/2021   Bigeminy    no current med.   Breast cancer ([place]) 06/2017   right   Bruises easily    CAD in native artery 10/31/2022   Carotid bruit 11/13/2019   Dental crowns present    History of diverticulitis    Hypertension    states under control with med., has been on med. x 5 yr.   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   Past Surgical History:  Procedure Laterality Date   ABDOMINAL HYSTERECTOMY     partial   BREAST LUMPECTOMY Right 06/22/2017   Malignant   BREAST LUMPECTOMY WITH RADIOACTIVE SEED AND SENTINEL LYMPH NODE BIOPSY Right 06/22/2017   Procedure: RIGHT BREAST LUMPECTOMY WITH RADIOACTIVE SEED AND RIGHT AXILLARY DEEP SENTINEL LYMPH NODE BIOPSY WITH BLUE DYE INJECTION;  Surgeon: Fanny Skates, MD;  Location: [facility];  Service: General;  Laterality: Right;   BREAST LUMPECTOMY WITH RADIOACTIVE SEED LOCALIZATION Left 06/22/2017   Procedure: LEFT BREAST LUMPECTOMY WITH RADIOACTIVE SEED LOCALIZATION;  Surgeon: Fanny Skates, MD;  Location: [facility];  Service: General;  Laterality: Left;    CATARACT EXTRACTION W/ INTRAOCULAR LENS  IMPLANT, BILATERAL Bilateral    EXCISION OF BREAST BIOPSY Left 06/22/2017   benign   LOOP RECORDER INSERTION N/A 05/16/2022   Procedure: LOOP RECORDER INSERTION;  Surgeon: Vickie Epley, MD;  Location: [facility];  Service: Cardiovascular;  Laterality: N/A;   PERCUTANEOUS PINNING Right 06/22/2022   Procedure: PERCUTANEOUS PINNING OF RIGHT HIP;  Surgeon: Hiram Gash, MD;  Location: [facility];  Service: Orthopedics;  Laterality: Right;   TONSILLECTOMY     age 1   Patient Active Problem List   Diagnosis Date Noted   CAD in native artery 10/31/2022   Malnutrition of moderate degree 06/22/2022   Closed fracture of femur, neck (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   Carotid bruit 11/13/2019   Pure hypercholesterolemia 11/13/2019   Malignant neoplasm of upper-outer quadrant of right breast in female, estrogen receptor positive (  [place]) 05/22/2017   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   Diplopia 09/17/2014   Dizziness and giddiness 09/17/2014   Essential hypertension 07/22/2014   Chest pain 07/22/2014   Family history of heart disease 07/22/2014    ONSET DATE: CVA 05/12/22  REFERRING DIAG: M79.641 (ICD-10-CM) - Pain in right hand  THERAPY DIAG:  Other lack of coordination  Hemiplegia and hemiparesis following cerebral infarction affecting left non-dominant side (HCC)  Pain in right arm  Abnormal posture  Rationale for Evaluation and  Treatment: Rehabilitation  SUBJECTIVE:   SUBJECTIVE STATEMENT: Pt reports splint is fitting fine, but that she has some discomfort when attempting to sleep with UE propped on pillow with splint on. Pt accompanied by: self and significant other (Husband, [name])  PERTINENT HISTORY: hypertension, breast cancer, hyperlipidemia, Lt hip fracture secondary to fall 06/2022, CVA 06/2022   PRECAUTIONS: Fall, Rt breast cancer with node removal, osteoporosis  WEIGHT BEARING RESTRICTIONS: No  PAIN:  Are you having pain? Yes: NPRS scale: 8/10 Pain location: hip Pain description: aching Aggravating factors: WB Relieving factors: repositioning, pain meds  FALLS: Has patient fallen in last 6 months? Yes. Number of falls 1 fall, one week after d/c home from SNF  LIVING ENVIRONMENT: Lives with: lives with their spouse Lives in: House/apartment Stairs: Yes: External: 2 steps; on left going up Has following equipment at home: Gilford Rile - 2 wheeled, Hemi walker, Wheelchair (manual), shower chair, and Grab bars  PLOF: Independent and Independent with basic ADLs  PATIENT GOALS: to get use of my R arm again  OBJECTIVE:   HAND DOMINANCE: Right  ADLs: Transfers/ambulation related to ADLs: Mod I with hemi-walker Eating: using L hand for eating, husband is assisting with cutting up food Grooming: Mod I, using L hand UB Dressing: Mod assist LB Dressing: Max assist Toileting: Mod I Bathing: Supervision - Mod I Tub Shower transfers: Min assist Equipment: Shower seat with back and Grab bars  UPPER EXTREMITY ROM:    Active ROM Right eval Left eval Right 10/18/22   Shoulder flexion 54    Shoulder abduction     Shoulder adduction     Shoulder extension     Shoulder internal rotation     Shoulder external rotation     Elbow flexion 108    Elbow extension -24    Wrist flexion     Wrist extension     Wrist ulnar deviation     Wrist radial deviation     Wrist pronation     Wrist supination      (Blank rows = not tested)  Passive ROM Right eval Left eval Right 10/18/22  Shoulder flexion 65  76  Shoulder abduction     Shoulder adduction     Shoulder extension     Shoulder internal rotation     Shoulder external rotation     Elbow flexion     Elbow extension -17  -15  Wrist flexion 46  56  Wrist extension 27  50  Wrist ulnar deviation     Wrist radial deviation     Wrist pronation     Wrist supination     (Blank rows = not tested)  Passive ROM Right 10/18/22  Thumb MCP (0-60)   Thumb IP (0-80)   Thumb Radial abd/add (0-55)   Thumb Palmar abd/add (0-45)   Thumb Opposition to Small Finger   Index MCP (0-90)   Index PIP (0-100)   Index DIP (0-70)    Long MCP (0-90)  Long PIP (0-100) -30   Long DIP (0-70) -20  Ring MCP (0-90)    Ring PIP (0-100) -25   Ring DIP (0-70) -15   Little MCP (0-90)    Little PIP (0-100)    Little DIP (0-70)    (Blank rows = not tested)   UPPER EXTREMITY MMT:   not assessed due to pain with any PROM, AROM    HAND FUNCTION: Loose gross grasp   COORDINATION: Box and Blocks:  Right 2blocks, Left 45 blocks. Modified to complete with RUE.  Pt able to grasp and move across to other side of box with barrier removed.    SENSATION: Mild decrease in R  EDEMA: mild edema in wrist hand, and fingers on RUE  OBSERVATIONS: Pt guarded with movement, hypersensitive to touch and PROM.     TODAY'S TREATMENT:                                   11/01/22 WB through RUE while hand placed over half ball to accommodate for decreased finger extension.  Engaged in WB through [place] while in standing and reaching across midline with opposite UE to facilitate increased WB. OT providing support at elbow and forearm to further facilitate weight shift and weight bearing. Box and blocks: Completed 12 with RUE; pt demonstrating increased hand/finger grasp/release to pick up blocks.  Completed again with barrier in place with pt able to transfer 13 blocks in 1  min time limit.  Pt frequently bumping RUE into barrier on the way back across barrier. UE therapeutic exercise: OT instructed pt in seated shoulder flexion with elbow extension with use of hemi-walker on back 2 legs.  OT providing min cues for posture to further facilitate increased ROM.  Pt able to complete with fading cues and good attention to technique.   10/30/22 Splint fit: Pt with no c/o redness or areas of concern with wear of resting hand splint.  Pt reports unable to tolerate wear overnight yet, due to onset of pain with prolonged stretch.  Encouraged increased wear as tolerated to progress to overnight as able. Reiterated ability to modify fit of splint as able. Pegs: AAROM include shoulder flexion, elbow extension, and wrist flexion during grasp and placement of large pegs into peg board.  OT providing tactile cues at torso to facilitate midline sitting balance to eliminate compensatory trunk movements as utilized to increase functional reach.  Pt demonstrating active wrist extension to midline with placement and removal of pegs.  Pt demonstrating loose gross grasp and some tripod grasp as needed for handwriting and self-feeding.  OT providing tactile and verbal cues to facilitate increased attempts at hand opening. Discussed PM&R and neurology for treatment of hypertonicity.  Pt asking questions about full/complete functional return of dominant RUE.  Discussed possibility of medication and/or alternative treatments to aid in treatment of hypertonicity.  Pt would require referral from PCP to establish pt with neurologist and/or PM&R. Handwriting:  OT educated on use of built up handle for handwriting and self-feeding.  Pt able to achieve tripod grasp to stabilize pen with built up handle.  Pt demonstrating decreased grasp strength and coordination to write name fully legibly.  OT provided pt with built up handle and pre-writing handouts to practice.  Encouraged pt to utilize RUE intermittently as  able with self-feeding, with use of utensil with built up handle or for finger foods.    10/25/22 ADL:  Educated on current progress towards ordering splint vs custom fabrication of splint during session.  Pt and spouse verbalizing frustration with need to go somewhere else to order a prefabricated splint, due to fact that therapy clinic is not a DME provider.  Discussed OT able to fabricate a splint to meet pt's particular/specific needs.  Pt agreeable to attempt.  Educated on splint wear and care with focus on initially wearing only 2 hours and assessing skin for redness or irritation after wear for 2 hours.  Discussed progression of splint wear after 2-3 days to 4 hours.  Pt and spouse verbalizing understanding of wear schedule and want to look for.  Pt able to doff and don with min cues when donning to ensure proper positioning of wrist to allow for extension at finger tips. Table slides: pt engaging in table slides with focus on shoulder flexion and horizontal abduction/adduction intermittently during splint fabrication. Splint fabrication: OT cut out pattern from previous session and utilized [employer] perforated Splinting Material to fabricate resting hand splint to facilitate increased extension and address contractures in long and ring fingers.   OT applied straps to resting hand splint at DIP, wrist, and forearm to facilitate appropriate fit.     PATIENT EDUCATION: Education details: ongoing condition specific education and progressive exercises. Person educated: Patient and Spouse Education method: Explanation, Demonstration, Tactile cues, Verbal cues, and Handouts Education comprehension: verbalized understanding and needs further education  HOME EXERCISE PROGRAM: Access Code: [email or web address] URL: [email or web address] Date: 10/23/2022 Prepared by: [facility]  Exercises - Supine Shoulder Flexion AAROM with Dowel  - 1 x  daily - 3 x weekly - 2 sets - 10 reps - Supine Shoulder Protraction with Dowel  - 1 x daily - 3 x weekly - 2 sets - 10 reps - Supine Shoulder Abduction AAROM with Dowel  - 1 x daily - 3 x weekly - 2 sets - 10 reps - Standing Single Bent Arm Shoulder Abduction   - 1 x daily - 3 x weekly - 2 sets - 10 reps - 5-10 sec hold - Hooklying Single Arm Chest Press  - 1 x daily - 3 x weekly - 2 sets - 10 reps - Seated Forearm Pronation Supination AROM  - 1 x daily - 7 x weekly - 2 sets - 10 reps - Hand PROM Finger Extension  - 1 x daily - 7 x weekly - 2 sets - 10 reps - Thumb AROM Opposition  - 1 x daily - 7 x weekly - 1 sets - 5 reps - Seated Isolated Finger PIP Flexion AROM  - 1 x daily - 7 x weekly - 1 sets - 5 reps - Seated Finger Composite Flexion Stretch  - 1 x daily - 7 x weekly - 1 sets - 5 reps - Wrist Flexion and Extension Caregiver PROM  - 1 x daily - 7 x weekly - 2 sets - 10 reps - 5-10 sec hold   GOALS: Goals reviewed with patient? Yes  SHORT TERM GOALS: Target date: 10/13/22  Pt and spouse will be independent with ROM and coordination HEP. Baseline: Goal status: MET - 10/16/22  2.  Pt and spouse will verbalize understanding of supine and sitting positioning to decrease edema and pain in RUE. Baseline:  Goal status: MET - 10/16/22  3.  Pt will verbalize understanding of task modifications and/or potential AE needs to increase ease, safety, and independence w/ ADLs. Baseline:  Goal status:  IN PROGRESS  4.  Pt will be able to complete UB dressing with min assist demonstrating/verbalizing understanding of hemi-dressing technique. Baseline:  Goal status: MET - 10/16/22    LONG TERM GOALS: Target date: 11/10/22  Pt will be able to complete UB dressing with Supervision assist (to include donning/fastening bra PRN) demonstrating/verbalizing understanding of hemi-dressing technique. Baseline:  Goal status: IN PROGRESS  2.  Pt will be able to incorporate use of RUE at non-dominant level  during LB dressing, to be completed at Mod I level. Baseline:  Goal status: IN PROGRESS  3.  Pt will demonstrate ability to pick up light weight object from table top as needed to increase engagement of RUE into ADLs and self-eeding. Baseline:  Goal status: MET - 11/01/22  4.  Pt will demonstrate improved UE functional use for ADLs as evidenced by increasing box/ blocks score by 5 blocks with RUE, to be completed in standardized manner. Baseline: Right 2blocks, Left 45 blocks. Modified to complete with RUE.  Pt able to grasp and move across to other side of box with barrier removed.   Goal status: MET - 13 blocks with RUE over barrier on 11/01/22  5.  Pt will report decreased pain in dominant RUE interfering with ADLs/IADLs by decreased impairment score on Quick DASH. Baseline: 77% Goal status: IN PROGRESS  6.  Pt will be independent with splint wear and care PRN Baseline: splint needs TBD Goal status: MET - 11/01/22  ASSESSMENT:  CLINICAL IMPRESSION: OT reached out to PCP In regards for referral for neurology and/or PM&R for further treatment of hypertonicity and to further educate on stroke risk and treatment options. Pt demonstrating functional grasp to pick up blocks to complete Box and Blocks assessment.  Pt continues to be limited by decreased shoulder flexion, elbow flexion/extension, and finger extension due to hypertonicity impacting use of RUE during ADLs and IADLs.  PERFORMANCE DEFICITS: in functional skills including ADLs, IADLs, coordination, sensation, edema, tone, ROM, strength, pain, flexibility, Fine motor control, Gross motor control, balance, body mechanics, endurance, decreased knowledge of use of DME, skin integrity, vision, and UE functional use, cognitive skills including emotional and safety awareness, and psychosocial skills including coping strategies.   IMPAIRMENTS: are limiting patient from ADLs, IADLs, and rest and sleep.   CO-MORBIDITIES: may have  co-morbidities  that affects occupational performance. Patient will benefit from skilled OT to address above impairments and improve overall function.  MODIFICATION OR ASSISTANCE TO COMPLETE EVALUATION: Min-Moderate modification of tasks or assist with assess necessary to complete an evaluation.  OT OCCUPATIONAL PROFILE AND HISTORY: Detailed assessment: Review of records and additional review of physical, cognitive, psychosocial history related to current functional performance.  CLINICAL DECISION MAKING: Moderate - several treatment options, min-mod task modification necessary  REHAB POTENTIAL: Good  EVALUATION COMPLEXITY: Moderate    PLAN:  OT FREQUENCY: 2x/week  OT DURATION: 8 weeks  PLANNED INTERVENTIONS: self care/ADL training, therapeutic exercise, therapeutic activity, neuromuscular re-education, manual therapy, passive range of motion, balance training, functional mobility training, splinting, paraffin, fluidotherapy, biofeedback, compression bandaging, moist heat, cryotherapy, contrast bath, patient/family education, visual/perceptual remediation/compensation, psychosocial skills training, coping strategies training, and DME and/or AE instructions  RECOMMENDED OTHER SERVICES: NA  CONSULTED AND AGREED WITH PLAN OF CARE: Patient and family member/caregiver  PLAN FOR NEXT SESSION: Review splint fit and make modification as needed.  AAROM or self-ROM to include shoulder, elbow, forearm at next session.  Continue to focus on PROM, massage, and stretching.  Functional grasp and release.  Attempt handwriting and/or self-feeding.   Simonne Come, OTR/L 11/01/2022, 2:19 PM

## 2022-11-03 ENCOUNTER — Telehealth: Payer: Self-pay | Admitting: Cardiovascular Disease

## 2022-11-03 DIAGNOSIS — E876 Hypokalemia: Secondary | ICD-10-CM

## 2022-11-03 LAB — COMPREHENSIVE METABOLIC PANEL
ALT: 11 IU/L (ref 0–32)
AST: 17 IU/L (ref 0–40)
Albumin/Globulin Ratio: 2.3 — ABNORMAL HIGH (ref 1.2–2.2)
Albumin: 4.6 g/dL (ref 3.8–4.8)
Alkaline Phosphatase: 84 IU/L (ref 44–121)
BUN/Creatinine Ratio: 11 — ABNORMAL LOW (ref 12–28)
BUN: 9 mg/dL (ref 8–27)
Bilirubin Total: 0.3 mg/dL (ref 0.0–1.2)
CO2: 19 mmol/L — ABNORMAL LOW (ref 20–29)
Calcium: 10.1 mg/dL (ref 8.7–10.3)
Chloride: 103 mmol/L (ref 96–106)
Creatinine, Ser: 0.79 mg/dL (ref 0.57–1.00)
Globulin, Total: 2 g/dL (ref 1.5–4.5)
Glucose: 97 mg/dL (ref 70–99)
Potassium: 2.9 mmol/L — CL (ref 3.5–5.2)
Sodium: 140 mmol/L (ref 134–144)
Total Protein: 6.6 g/dL (ref 6.0–8.5)
eGFR: 79 mL/min/{1.73_m2} (ref >59)

## 2022-11-03 LAB — LIPID PANEL
Chol/HDL Ratio: 2.2 ratio (ref 0.0–4.4)
Cholesterol, Total: 184 mg/dL (ref 100–199)
HDL: 82 mg/dL (ref >39)
LDL Chol Calc (NIH): 81 mg/dL (ref 0–99)
Triglycerides: 124 mg/dL (ref 0–149)
VLDL Cholesterol Cal: 21 mg/dL (ref 5–40)

## 2022-11-03 NOTE — Telephone Encounter (Signed)
Returned call to patient and provided recommendations from Dr. Harrell Gave. Patient potassium is low at baseline, encouraged patient to eat some potassium rich food and check BMP in one week, BMP ordered and mailed to patient.   Patient is concerned about her lipid panel due to having a stroke previously. Informed patient that our call today would go to Dr. Oval Linsey for review and we would be in touch with further recommendations surrounding cholesterol panel.

## 2022-11-03 NOTE — Addendum Note (Signed)
Addended by: Gerald Stabs on: 11/03/2022 12:58 PM   Modules accepted: Orders

## 2022-11-03 NOTE — Progress Notes (Signed)
[place] [facility] [employer]  

## 2022-11-03 NOTE — Telephone Encounter (Signed)
Returned call to patient,   Informed patient that her lab results have not been formally reviewed at this time. However, her potassium is low at 2.9. RN has printed labs and will have in office provider review them in clinic this afternoon.

## 2022-11-08 ENCOUNTER — Ambulatory Visit: Payer: Medicare PPO | Attending: Family Medicine

## 2022-11-08 DIAGNOSIS — R2681 Unsteadiness on feet: Secondary | ICD-10-CM | POA: Diagnosis not present

## 2022-11-08 DIAGNOSIS — M79601 Pain in right arm: Secondary | ICD-10-CM | POA: Diagnosis not present

## 2022-11-08 DIAGNOSIS — I69354 Hemiplegia and hemiparesis following cerebral infarction affecting left non-dominant side: Secondary | ICD-10-CM | POA: Diagnosis not present

## 2022-11-08 DIAGNOSIS — M6281 Muscle weakness (generalized): Secondary | ICD-10-CM | POA: Insufficient documentation

## 2022-11-08 DIAGNOSIS — R2689 Other abnormalities of gait and mobility: Secondary | ICD-10-CM | POA: Insufficient documentation

## 2022-11-08 DIAGNOSIS — R278 Other lack of coordination: Secondary | ICD-10-CM | POA: Insufficient documentation

## 2022-11-08 NOTE — Therapy (Signed)
OUTPATIENT PHYSICAL THERAPY TREATMENT NOTE   Patient Name: Angela Buchanan MRN: [id number] DOB:May 30, 1951, 72 y.o., female Today's Date: 11/08/2022  PCP:  Sela Hilding, MD   REFERRING PROVIDER:  Sela Hilding, MD   Progress Note Reporting Period 08/09/22 to 10/30/22  See note below for Objective Data and Assessment of Progress/Goals.  Sigurd Sos, PT 11/08/22 4:12 PM    END OF SESSION:   PT End of Session - 11/08/22 1610     Visit Number 11    Date for PT Re-Evaluation 11/22/22    Authorization Type 20 visitis 11/8-2/21/24    Authorization - Visit Number 11    Authorization - Number of Visits 20    Progress Note Due on Visit 20    PT Start Time 1321    PT Stop Time 1411    PT Time Calculation (min) 50 min    Activity Tolerance Patient tolerated treatment well    Behavior During Therapy [facility] for tasks assessed/performed                    Past Medical History:  Diagnosis Date   Atypical chest pain 07/19/2021   Bigeminy    no current med.   Breast cancer ([employer]) 06/2017   right   Bruises easily    CAD in native artery 10/31/2022   Carotid bruit 11/13/2019   Dental crowns present    History of diverticulitis    Hypertension    states under control with med., has been on med. x 5 yr.   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   Past Surgical History:  Procedure Laterality Date   ABDOMINAL HYSTERECTOMY     partial   BREAST LUMPECTOMY Right 06/22/2017   Malignant   BREAST LUMPECTOMY WITH RADIOACTIVE SEED AND SENTINEL LYMPH NODE BIOPSY Right 06/22/2017   Procedure: RIGHT BREAST LUMPECTOMY WITH RADIOACTIVE SEED AND RIGHT AXILLARY DEEP SENTINEL LYMPH NODE BIOPSY WITH BLUE DYE INJECTION;  Surgeon:  Fanny Skates, MD;  Location: [place];  Service: General;  Laterality: Right;   BREAST LUMPECTOMY WITH RADIOACTIVE SEED LOCALIZATION Left 06/22/2017   Procedure: LEFT BREAST LUMPECTOMY WITH RADIOACTIVE SEED LOCALIZATION;  Surgeon: Fanny Skates, MD;  Location: [place];  Service: General;  Laterality: Left;   CATARACT EXTRACTION W/ INTRAOCULAR LENS  IMPLANT, BILATERAL Bilateral    EXCISION OF BREAST BIOPSY Left 06/22/2017   benign   LOOP RECORDER INSERTION N/A 05/16/2022   Procedure: LOOP RECORDER INSERTION;  Surgeon: Vickie Epley, MD;  Location: [place] [facility];  Service: Cardiovascular;  Laterality: N/A;   PERCUTANEOUS PINNING Right 06/22/2022   Procedure: PERCUTANEOUS PINNING OF RIGHT HIP;  Surgeon: Hiram Gash, MD;  [facility];  Service: Orthopedics;  Laterality: Right;   TONSILLECTOMY     age 62   Patient Active Problem List   Diagnosis Date Noted   CAD in native artery 10/31/2022   Malnutrition of moderate degree 06/22/2022   Closed fracture of femur, neck ([place]) 06/21/2022   Urinary frequency 06/21/2022   Hypokalemia 06/21/2022   Heart murmur 06/21/2022   Hypothyroidism 06/21/2022   Dysphagia 05/17/2022   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   CVA (cerebral vascular accident) ([place]) 05/12/2022   Osteoporosis 01/24/2022   Atypical chest  pain 07/19/2021   Temporomandibular joint (TMJ) pain 07/16/2020   Referred otalgia of both ears 07/16/2020   PAC (premature atrial contraction) 05/11/2020   PVC (premature ventricular contraction) 05/11/2020   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   Carotid bruit 11/13/2019   Pure hypercholesterolemia 11/13/2019   Malignant neoplasm of upper-outer quadrant of right breast in female, estrogen receptor positive ([place]) 05/22/2017   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   Diplopia 09/17/2014   Dizziness and giddiness 09/17/2014   Essential hypertension 07/22/2014   Chest pain 07/22/2014    Family history of heart disease 07/22/2014    REFERRING DIAG:  R29.6 (ICD-10-CM) - Repeated falls, closed fracture of Rt hip    THERAPY DIAG:  Muscle weakness (generalized)  Unsteadiness on feet  Other abnormalities of gait and mobility  Rationale for Evaluation and Treatment Rehabilitation  PERTINENT HISTORY: hypertension, breast cancer, hyperlipidemia, Lt hip fracture secondary to fall 06/2022, CVA 06/2022   PRECAUTIONS:  Fall, Rt breast cancer with node removal, osteoporosis    SUBJECTIVE:                                                                                                                                                                                      SUBJECTIVE STATEMENT:  I have a splint for my Rt hand to stretch it.  It is a little easier to get out of the truck.     PAIN:  Are you having pain? Not right now but my right hand and arm can be very sore at times.    OBJECTIVE: (objective measures completed at initial evaluation unless otherwise dated)  DIAGNOSTIC FINDINGS: Rt hip fracture and ORIF     COGNITION: Overall cognitive status: Within functional limits for tasks assessed                         SENSATION: WFL     POSTURE: rounded shoulders, forward head, flexed trunk , and weight shift left   PALPATION: NA   LOWER EXTREMITY ROM: Rt hip limited by 50%, hamstrings limited by 50% bil.    LOWER EXTREMITY MMT:   MMT Right eval Right  09/27/22 Left eval Right 10/30/22 Left 10/30/22  Hip flexion 3+ 4- 4- 4- 4-  Hip extension         Hip abduction 3-  4    Hip adduction         Hip internal rotation         Hip external rotation         Knee flexion 3- 4- 3+    Knee extension 3+ 4 4- 4 painful hip 4-  Ankle dorsiflexion 3+ 4- 4- 4- 4  Ankle plantarflexion         Ankle inversion         Ankle eversion          (Blank rows = not tested)   FUNCTIONAL TESTS:  5 times sit to stand: not able to due per pt request secondary to Lt  shoulder pain with sit to stand.  Max LT UE pain with sit to stand   Timed up and go (TUG): 1 min, 27 seconds   09/27/22: 5x sit to stand: 29 seconds with use of Lt hand  TUG: 27 seconds   10/30/22: 5x sit to stand: 23 sec use of LT hand     TUG: 26 sec with standard cane GAIT: Distance walked: 25 feet  Assistive device utilized: Hemi walker on Lt Level of assistance: SBA Comments: slow mobility, reduced trunk rotation, reduce Rt LE stance time and reduced Rt arm swing     TODAY'S TREATMENT:    11/08/22: Nustep L3 x6 min with PT present to monitor and discuss progress  Sit to stand: x10 without UE support LAQ 2.5# LT 2x10, Rt 2x10 with 5" hold Tandem stance: Rt and Lt- next to barre 3x10 seconds each Standing marching 2x10 with 2.5# weight added Stepping over hurdles at barre: forward and lateral leading with Rt and Lt with min UE support as needed Seated ball squeezes x20 Alternating step taps on 4" step with single hand support by PT x20  Gait with standard cane: x200 feet- cueing for step length  10/30/22: Nustep L3 6 min with PTA present to monitor and discuss progress  MMT, TUG, 5x Sit to stand LAQ 2.5# LT 2x10, RT 10x with PTA providing TC to relax RT UT Standing marching 10x, changed HEP to include standing marching instead of seated. TA: Getting in/out of the truck with less assistance from her husband: 3x using LT UE and LE  10/23/22: Nustep L3 6 min with PTA present to monitor and discuss progress  LAQ 2.5# 10x Bil, feet on purple mat. VC to control RTLE Blu eloop clamshells x15 GAIT: 1 lap of building with standard cane and light CGA, a few short stops to catch breath due to faster pace.  Sit to stand 2x10   PATIENT EDUCATION:  Education details: Access Code: [email or web address] Person educated: Patient Education method: Explanation, Demonstration, and Handouts Education comprehension: verbalized understanding and returned demonstration   HOME EXERCISE PROGRAM: Access  Code: [email or web address] URL: [email or web address] Date: 09/27/2022 Prepared by: Claiborne Billings  Exercises - Seated Long Arc Quad  - 3 x daily - 7 x weekly - 2 sets - 10 reps - 5 hold - Seated March   - 3 x daily - 7 x weekly - 3 sets - 10 reps - Seated Heel Toe Raises   - 3 x daily - 7 x weekly - 2 sets - 10 reps - Seated Isometric Hip Adduction with Ball  - 3 x daily - 7 x weekly - 2 sets - 10 reps - Sit to Stand with Armchair  - 2 x daily - 7 x weekly - 2 sets - 5-10 reps - Standing Hip Abduction with Counter Support  - 1 x daily - 7 x weekly - 2 sets - 10 reps - Heel Raises with Counter Support  - 2 x daily - 7 x weekly - 2 sets - 10 reps  Patient Education - Walking with a Single General [place] - Modified 4 Point Gait  Pattern  ASSESSMENT:   CLINICAL IMPRESSION:  Pt continues to work on strength and mobility to improve gait, endurance and safety.  TUG and 5x sit to stand continue to improve. Pt ambulates all  community distances with hemiwalker and is not using device for home distances.  Pt did well with balance exercises in the clinic and required stand by assistance for safety and cueing by PT. Patient will benefit from skilled PT to address the below impairments and improve overall function.  OBJECTIVE IMPAIRMENTS: Abnormal gait, decreased activity tolerance, decreased balance, decreased mobility, difficulty walking, decreased strength, decreased safety awareness, impaired perceived functional ability, impaired flexibility, postural dysfunction, and pain.    ACTIVITY LIMITATIONS: carrying, lifting, sitting, standing, stairs, transfers, hygiene/grooming, and locomotion level   PARTICIPATION LIMITATIONS: meal prep, cleaning, laundry, driving, shopping, and community activity   PERSONAL FACTORS: Age, Past/current experiences, and 1-2 comorbidities: CVA, falls, Rt hip fracture with ORIF  are also affecting patient's functional outcome.    REHAB POTENTIAL: Good   CLINICAL DECISION MAKING:  Evolving/moderate complexity   EVALUATION COMPLEXITY: Moderate     GOALS: Goals reviewed with patient? Yes   SHORT TERM GOALS: Target date: 09/06/2022   Be independent in initial HEP Baseline: Goal status: Goal met 08/14/22   2.  Improve LE strength to perform sit to stand with moderate Lt UE support Baseline:  Goal status: Goal met 08/28/22  3.  Perform TUG in < or = to 60 seconds to reduce falls risk Baseline: 27 seconds (09/27/22) Goal status: MET          LONG TERM GOALS: Target date:  11/22/22   Be independent in advanced HEP Baseline: independent in current HEP and further progress is needed  Goal status: in progress    2.  Perform TUG in < or = to 15 seconds to reduce falls risk Baseline: 27 seconds (09/27/22) Goal status: REVISED    3.  Improve LE strength to perform sit to stand with min to no UE support  Baseline: min to moderate Lt UE support (09/27/22) Goal status: in progress    4.  Ambulate with hemi walker with supervision or no guard due to improved gait Baseline: able to do this Goal status: MET   5.  Demonstrate > or = to 4-/5 to 4/5 Rt hip and knee strength to improve safety and stability  Baseline: see above chart (09/27/22) Goal status: In progress   6. Ambulate with cane for all distances and demonstrate independence due to improved stability  Baseline: using hemiwalker  Goal status: INITIAL     PLAN:   PT FREQUENCY: 1-2x/week   PT DURATION: 8 weeks   PLANNED INTERVENTIONS: Therapeutic exercises, Therapeutic activity, Neuromuscular re-education, Balance training, Gait training, Patient/Family education, Self Care, Joint mobilization, Stair training, Dry Needling, Electrical stimulation, Cryotherapy, Moist heat, Taping, Manual therapy, and Re-evaluation   PLAN FOR NEXT SESSION: Continue to work on gait , strength and balance  Sigurd Sos, PT 11/08/22 4:12 PM

## 2022-11-10 ENCOUNTER — Telehealth (HOSPITAL_BASED_OUTPATIENT_CLINIC_OR_DEPARTMENT_OTHER): Payer: Self-pay

## 2022-11-10 DIAGNOSIS — E876 Hypokalemia: Secondary | ICD-10-CM

## 2022-11-10 DIAGNOSIS — E7800 Pure hypercholesterolemia, unspecified: Secondary | ICD-10-CM

## 2022-11-10 MED ORDER — POTASSIUM CHLORIDE CRYS ER 20 MEQ PO TBCR: 40.0000 meq | EXTENDED_RELEASE_TABLET | Freq: Every day | ORAL | 6 refills | Status: DC

## 2022-11-10 NOTE — Telephone Encounter (Addendum)
Results called to patient who verbalizes understanding! Labs ordered and mailed, prescriptions updated and sent to pharmacy on file .   ----- Message from Loel Dubonnet, NP sent at 11/09/2022 10:12 PM EST ----- LDL not at goal of <70 - refer to pharmacy clinic to consider PCSK9i. Normal kidneys, liver.  Potassium is low. Start Potassium 33mq daily and repeat BMP Monday.

## 2022-11-13 ENCOUNTER — Ambulatory Visit: Payer: Medicare PPO

## 2022-11-13 DIAGNOSIS — M6281 Muscle weakness (generalized): Secondary | ICD-10-CM | POA: Diagnosis not present

## 2022-11-13 DIAGNOSIS — I69354 Hemiplegia and hemiparesis following cerebral infarction affecting left non-dominant side: Secondary | ICD-10-CM | POA: Diagnosis not present

## 2022-11-13 DIAGNOSIS — R2681 Unsteadiness on feet: Secondary | ICD-10-CM

## 2022-11-13 DIAGNOSIS — M79601 Pain in right arm: Secondary | ICD-10-CM | POA: Diagnosis not present

## 2022-11-13 DIAGNOSIS — R278 Other lack of coordination: Secondary | ICD-10-CM

## 2022-11-13 DIAGNOSIS — R2689 Other abnormalities of gait and mobility: Secondary | ICD-10-CM

## 2022-11-13 NOTE — Therapy (Signed)
OUTPATIENT PHYSICAL THERAPY TREATMENT NOTE   Patient Name: Angela Buchanan MRN: [id number] DOB:1950-12-14, 72 y.o., female Today's Date: 11/13/2022  PCP:  Sela Hilding, MD   REFERRING PROVIDER:  Sela Hilding, MD      END OF SESSION:   PT End of Session - 11/13/22 1310     Visit Number 12    Date for PT Re-Evaluation 11/22/22    Authorization Type 20 visitis 11/8-2/21/24    Authorization - Visit Number 12    Authorization - Number of Visits 20    Progress Note Due on Visit 20    PT Start Time 1226    PT Stop Time 1308    PT Time Calculation (min) 42 min    Activity Tolerance Patient tolerated treatment well    Behavior During Therapy [facility] for tasks assessed/performed                     Past Medical History:  Diagnosis Date   Atypical chest pain 07/19/2021   Bigeminy    no current med.   Breast cancer ([place]) 06/2017   right   Bruises easily    CAD in native artery 10/31/2022   Carotid bruit 11/13/2019   Dental crowns present    History of diverticulitis    Hypertension    states under control with med., has been on med. x 5 yr.   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   Past Surgical History:  Procedure Laterality Date   ABDOMINAL HYSTERECTOMY     partial   BREAST LUMPECTOMY Right 06/22/2017   Malignant   BREAST LUMPECTOMY WITH RADIOACTIVE SEED AND SENTINEL LYMPH NODE BIOPSY Right 06/22/2017   Procedure: RIGHT BREAST LUMPECTOMY WITH RADIOACTIVE SEED AND RIGHT AXILLARY DEEP SENTINEL LYMPH NODE BIOPSY WITH BLUE DYE INJECTION;  Surgeon: Fanny Skates, MD;  Location: [place];  Service: General;  Laterality: Right;   BREAST LUMPECTOMY WITH RADIOACTIVE SEED LOCALIZATION Left  06/22/2017   Procedure: LEFT BREAST LUMPECTOMY WITH RADIOACTIVE SEED LOCALIZATION;  Surgeon: Fanny Skates, MD;  Location: [place];  Service: General;  Laterality: Left;   CATARACT EXTRACTION W/ INTRAOCULAR LENS  IMPLANT, BILATERAL Bilateral    EXCISION OF BREAST BIOPSY Left 06/22/2017   benign   LOOP RECORDER INSERTION N/A 05/16/2022   Procedure: LOOP RECORDER INSERTION;  Surgeon: Vickie Epley, MD;  Location: [place] [facility];  Service: Cardiovascular;  Laterality: N/A;   PERCUTANEOUS PINNING Right 06/22/2022   Procedure: PERCUTANEOUS PINNING OF RIGHT HIP;  Surgeon: Hiram Gash, MD;  [facility];  Service: Orthopedics;  Laterality: Right;   TONSILLECTOMY     age 62   Patient Active Problem List   Diagnosis Date Noted   CAD in native artery 10/31/2022   Malnutrition of moderate degree 06/22/2022   Closed fracture of femur, neck ([place]) 06/21/2022   Urinary frequency 06/21/2022   Hypokalemia 06/21/2022   Heart murmur 06/21/2022   Hypothyroidism 06/21/2022   Dysphagia 05/17/2022   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   CVA (cerebral vascular accident) ([place]) 05/12/2022   Osteoporosis 01/24/2022   Atypical chest pain 07/19/2021   Temporomandibular joint (TMJ) pain 07/16/2020   Referred otalgia of both ears 07/16/2020   PAC (premature atrial contraction) 05/11/2020  PVC (premature ventricular contraction) 05/11/2020   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   Carotid bruit 11/13/2019   Pure hypercholesterolemia 11/13/2019   Malignant neoplasm of upper-outer quadrant of right breast in female, estrogen receptor positive ([place]) 05/22/2017   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   Diplopia 09/17/2014   Dizziness and giddiness 09/17/2014   Essential hypertension 07/22/2014   Chest pain 07/22/2014   Family history of heart disease 07/22/2014    REFERRING DIAG:  R29.6 (ICD-10-CM) - Repeated falls, closed fracture of Rt hip    THERAPY DIAG:  Muscle  weakness (generalized)  Unsteadiness on feet  Other abnormalities of gait and mobility  Other lack of coordination  Rationale for Evaluation and Treatment Rehabilitation  PERTINENT HISTORY: hypertension, breast cancer, hyperlipidemia, Lt hip fracture secondary to fall 06/2022, CVA 06/2022   PRECAUTIONS:  Fall, Rt breast cancer with node removal, osteoporosis    SUBJECTIVE:                                                                                                                                                                                      SUBJECTIVE STATEMENT:  I haven't heard from the neurologist yet.  I am working on my balance. I looked at a cane at the store and I think I want a small base quad cane.    PAIN:  Are you having pain? Not right now but my right hand and arm can be very sore at times.    OBJECTIVE: (objective measures completed at initial evaluation unless otherwise dated)  DIAGNOSTIC FINDINGS: Rt hip fracture and ORIF     COGNITION: Overall cognitive status: Within functional limits for tasks assessed                         SENSATION: WFL     POSTURE: rounded shoulders, forward head, flexed trunk , and weight shift left   PALPATION: NA   LOWER EXTREMITY ROM: Rt hip limited by 50%, hamstrings limited by 50% bil.    LOWER EXTREMITY MMT:   MMT Right eval Right  09/27/22 Left eval Right 10/30/22 Left 10/30/22  Hip flexion 3+ 4- 4- 4- 4-  Hip extension         Hip abduction 3-  4    Hip adduction         Hip internal rotation         Hip external rotation         Knee flexion 3- 4- 3+    Knee extension 3+ 4 4- 4 painful hip 4-  Ankle dorsiflexion 3+ 4- 4- 4- 4  Ankle plantarflexion  Ankle inversion         Ankle eversion          (Blank rows = not tested)   FUNCTIONAL TESTS:  5 times sit to stand: not able to due per pt request secondary to Lt shoulder pain with sit to stand.  Max LT UE pain with sit to stand   Timed up  and go (TUG): 1 min, 27 seconds   09/27/22: 5x sit to stand: 29 seconds with use of Lt hand  TUG: 27 seconds   10/30/22: 5x sit to stand: 23 sec use of LT hand     TUG: 26 sec with standard cane GAIT: Distance walked: 25 feet  Assistive device utilized: Hemi walker on Lt Level of assistance: SBA Comments: slow mobility, reduced trunk rotation, reduce Rt LE stance time and reduced Rt arm swing     TODAY'S TREATMENT:    11/13/22: Nustep L3 x6 min with PT present to monitor and discuss progress  Sit to stand: x10 without UE support LAQ 2.5# Lt 2x10, Rt 2x10 with 5" hold Tandem stance: Rt and Lt- next to barre 3x10 seconds each Standing marching 2x10 with 2.5# weight added-standing on balance pad Stepping over hurdles at barre: forward and lateral leading with Rt and Lt with min UE support as needed Seated ball squeezes x20 Alternating step taps on 6"  step with single hand support by PT x20   11/08/22: Nustep L3 x6 min with PT present to monitor and discuss progress  Sit to stand: x10 without UE support LAQ 2.5# LT 2x10, Rt 2x10 with 5" hold Tandem stance: Rt and Lt- next to barre 3x10 seconds each Standing marching 2x10 with 2.5# weight added Stepping over hurdles at barre: forward and lateral leading with Rt and Lt with min UE support as needed Seated ball squeezes x20 Alternating step taps on 4" step with single hand support by PT x20  Gait with standard cane: x200 feet- cueing for step length  10/30/22: Nustep L3 6 min with PTA present to monitor and discuss progress  MMT, TUG, 5x Sit to stand LAQ 2.5# LT 2x10, RT 10x with PTA providing TC to relax RT UT Standing marching 10x, changed HEP to include standing marching instead of seated. TA: Getting in/out of the truck with less assistance from her husband: 3x using LT UE and LE   PATIENT EDUCATION:  Education details: Access Code: [email or web address] Person educated: Patient Education method: Explanation, Demonstration, and  Handouts Education comprehension: verbalized understanding and returned demonstration   HOME EXERCISE PROGRAM: Access Code: [email or web address] URL: [email or web address] Date: 09/27/2022 Prepared by: Claiborne Billings  Exercises - Seated Long Arc Quad  - 3 x daily - 7 x weekly - 2 sets - 10 reps - 5 hold - Seated March   - 3 x daily - 7 x weekly - 3 sets - 10 reps - Seated Heel Toe Raises   - 3 x daily - 7 x weekly - 2 sets - 10 reps - Seated Isometric Hip Adduction with Ball  - 3 x daily - 7 x weekly - 2 sets - 10 reps - Sit to Stand with Armchair  - 2 x daily - 7 x weekly - 2 sets - 5-10 reps - Standing Hip Abduction with Counter Support  - 1 x daily - 7 x weekly - 2 sets - 10 reps - Heel Raises with Counter Support  - 2 x daily - 7 x weekly - 2 sets - 10 reps  Patient Education - Walking with a Single General [place] - Modified 4 Point Gait Pattern  ASSESSMENT:   CLINICAL IMPRESSION:  Pt continues to work on strength and mobility to improve gait, endurance and safety.  Pt reports that her Lt UE becomes fatigued with use of hemiwalker.  She is going to get a small base quad cane. Pt did well with advancement of standing marching on balance pad.  Pt with improved independence with tandem stance.   Pt did well with balance exercises in the clinic and required stand by assistance for safety and cueing by PT. Patient will benefit from skilled PT to address the below impairments and improve overall function.   OBJECTIVE IMPAIRMENTS: Abnormal gait, decreased activity tolerance, decreased balance, decreased mobility, difficulty walking, decreased strength, decreased safety awareness, impaired perceived functional ability, impaired flexibility, postural dysfunction, and pain.    ACTIVITY LIMITATIONS: carrying, lifting, sitting, standing, stairs, transfers, hygiene/grooming, and locomotion level   PARTICIPATION LIMITATIONS: meal prep, cleaning, laundry, driving, shopping, and community activity    PERSONAL FACTORS: Age, Past/current experiences, and 1-2 comorbidities: CVA, falls, Rt hip fracture with ORIF  are also affecting patient's functional outcome.    REHAB POTENTIAL: Good   CLINICAL DECISION MAKING: Evolving/moderate complexity   EVALUATION COMPLEXITY: Moderate     GOALS: Goals reviewed with patient? Yes   SHORT TERM GOALS: Target date: 09/06/2022   Be independent in initial HEP Baseline: Goal status: Goal met 08/14/22   2.  Improve LE strength to perform sit to stand with moderate Lt UE support Baseline:  Goal status: Goal met 08/28/22  3.  Perform TUG in < or = to 60 seconds to reduce falls risk Baseline: 27 seconds (09/27/22) Goal status: MET          LONG TERM GOALS: Target date:  11/22/22   Be independent in advanced HEP Baseline: independent in current HEP and further progress is needed  Goal status: in progress    2.  Perform TUG in < or = to 15 seconds to reduce falls risk Baseline: 27 seconds (09/27/22) Goal status: REVISED    3.  Improve LE strength to perform sit to stand with min to no UE support  Baseline: min to moderate Lt UE support (09/27/22) Goal status: in progress    4.  Ambulate with hemi walker with supervision or no guard due to improved gait Baseline: able to do this Goal status: MET   5.  Demonstrate > or = to 4-/5 to 4/5 Rt hip and knee strength to improve safety and stability  Baseline: see above chart (09/27/22) Goal status: In progress   6. Ambulate with cane for all distances and demonstrate independence due to improved stability  Baseline: using hemiwalker  Goal status: INITIAL     PLAN:   PT FREQUENCY: 1-2x/week   PT DURATION: 8 weeks   PLANNED INTERVENTIONS: Therapeutic exercises, Therapeutic activity, Neuromuscular re-education, Balance training, Gait training, Patient/Family education, Self Care, Joint mobilization, Stair training, Dry Needling, Electrical stimulation, Cryotherapy, Moist heat, Taping,  Manual therapy, and Re-evaluation   PLAN FOR NEXT SESSION: Continue to work on gait , strength and balance  Sigurd Sos, PT 11/13/22 1:11 PM

## 2022-11-14 ENCOUNTER — Telehealth (HOSPITAL_BASED_OUTPATIENT_CLINIC_OR_DEPARTMENT_OTHER): Payer: Self-pay

## 2022-11-14 NOTE — Telephone Encounter (Addendum)
Returned call to patient and she states she will have them done by the end of the week!    ----- Message from Gerald Stabs, RN sent at 11/10/2022  8:15 AM EST ----- Check to ensure patient had labs done

## 2022-11-15 ENCOUNTER — Encounter: Payer: Self-pay | Admitting: Physical Medicine & Rehabilitation

## 2022-11-15 ENCOUNTER — Ambulatory Visit: Payer: Medicare PPO | Admitting: Occupational Therapy

## 2022-11-15 DIAGNOSIS — R278 Other lack of coordination: Secondary | ICD-10-CM

## 2022-11-15 DIAGNOSIS — M6281 Muscle weakness (generalized): Secondary | ICD-10-CM | POA: Diagnosis not present

## 2022-11-15 DIAGNOSIS — I69354 Hemiplegia and hemiparesis following cerebral infarction affecting left non-dominant side: Secondary | ICD-10-CM

## 2022-11-15 DIAGNOSIS — R2689 Other abnormalities of gait and mobility: Secondary | ICD-10-CM | POA: Diagnosis not present

## 2022-11-15 DIAGNOSIS — R2681 Unsteadiness on feet: Secondary | ICD-10-CM | POA: Diagnosis not present

## 2022-11-15 DIAGNOSIS — M79601 Pain in right arm: Secondary | ICD-10-CM

## 2022-11-15 NOTE — Therapy (Signed)
OUTPATIENT OCCUPATIONAL THERAPY  Treatment Session  Patient Name: Angela Buchanan MRN: [id number] DOB:1950/11/23, 72 y.o., female Today's Date: 11/15/2022  PCP: Glenis Smoker, MD REFERRING PROVIDER: Glenis Smoker, MD       END OF SESSION:  OT End of Session - 11/15/22 1453     Visit Number 15    Number of Visits 29    Date for OT Re-Evaluation 12/29/22    Authorization Type [employer]    OT Start Time 1450    OT Stop Time 1533    OT Time Calculation (min) 43 min                          Past Medical History:  Diagnosis Date   Atypical chest pain 07/19/2021   Bigeminy    no current med.   Breast cancer ([place]) 06/2017   right   Bruises easily    CAD in native artery 10/31/2022   Carotid bruit 11/13/2019   Dental crowns present    History of diverticulitis    Hypertension    states under control with med., has been on med. x 5 yr.   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   Past Surgical History:  Procedure Laterality Date   ABDOMINAL HYSTERECTOMY     partial   BREAST LUMPECTOMY Right 06/22/2017   Malignant   BREAST LUMPECTOMY WITH RADIOACTIVE SEED AND SENTINEL LYMPH NODE BIOPSY Right 06/22/2017   Procedure: RIGHT BREAST LUMPECTOMY WITH RADIOACTIVE SEED AND RIGHT AXILLARY DEEP SENTINEL LYMPH NODE BIOPSY WITH BLUE DYE INJECTION;  Surgeon: Fanny Skates, MD;  Location: [facility];  Service: General;  Laterality: Right;   BREAST LUMPECTOMY WITH RADIOACTIVE SEED LOCALIZATION Left 06/22/2017   Procedure: LEFT BREAST LUMPECTOMY WITH RADIOACTIVE SEED LOCALIZATION;  Surgeon: Fanny Skates, MD;  Location: [facility];  Service: General;  Laterality: Left;    CATARACT EXTRACTION W/ INTRAOCULAR LENS  IMPLANT, BILATERAL Bilateral    EXCISION OF BREAST BIOPSY Left 06/22/2017   benign   LOOP RECORDER INSERTION N/A 05/16/2022   Procedure: LOOP RECORDER INSERTION;  Surgeon: Vickie Epley, MD;  Location: [facility];  Service: Cardiovascular;  Laterality: N/A;   PERCUTANEOUS PINNING Right 06/22/2022   Procedure: PERCUTANEOUS PINNING OF RIGHT HIP;  Surgeon: Hiram Gash, MD;  Location: [facility];  Service: Orthopedics;  Laterality: Right;   TONSILLECTOMY     age 14   Patient Active Problem List   Diagnosis Date Noted   CAD in native artery 10/31/2022   Malnutrition of moderate degree 06/22/2022   Closed fracture of femur, neck ([place]) 06/21/2022   Urinary frequency 06/21/2022   Hypokalemia 06/21/2022   Heart murmur 06/21/2022   Hypothyroidism 06/21/2022   Dysphagia 05/17/2022   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   CVA (cerebral vascular accident) ([place]) 05/12/2022   Osteoporosis 01/24/2022   Atypical chest pain 07/19/2021   Temporomandibular joint (TMJ) pain 07/16/2020   Referred otalgia of both ears 07/16/2020   PAC (premature atrial contraction) 05/11/2020   PVC (premature ventricular contraction) 05/11/2020   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   Carotid bruit 11/13/2019   Pure hypercholesterolemia 11/13/2019   Malignant neoplasm of upper-outer quadrant of right breast in female, estrogen receptor  positive ([place]) 05/22/2017   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 Pt accompanied by: self and significant other (Husband, [name])  PERTINENT HISTORY: hypertension, breast cancer, hyperlipidemia, Lt hip fracture secondary to fall 06/2022, CVA 06/2022   PRECAUTIONS: Fall, Rt breast cancer with node removal, osteoporosis  WEIGHT BEARING RESTRICTIONS: No  PAIN:  Are you having pain? No  FALLS: Has patient fallen in last 6 months? Yes. Number of falls 1 fall, one week after d/c home from SNF  LIVING ENVIRONMENT: Lives with: lives with their spouse Lives in: House/apartment Stairs: Yes: External: 2 steps; on left going up Has following equipment at home: Gilford Rile - 2 wheeled, Hemi walker, Wheelchair (manual), shower chair, and Grab bars  PLOF: Independent and Independent with basic ADLs  PATIENT GOALS: to get use of my R arm again  OBJECTIVE:   HAND DOMINANCE: Right  ADLs: Transfers/ambulation related to ADLs: Mod I with hemi-walker Eating: using L hand for eating, husband is assisting with cutting up food Grooming: Mod I, using L hand UB Dressing: Mod assist LB Dressing: Max assist Toileting: Mod I Bathing: Supervision - Mod I Tub Shower transfers: Dentist: Shower seat with back and Grab bars  UPPER EXTREMITY ROM:    Active ROM Right eval Left eval Right 10/18/22  Right 11/15/22  Shoulder flexion 54   82  Shoulder abduction      Shoulder adduction      Shoulder extension      Shoulder internal rotation      Shoulder external rotation      Elbow flexion 108   132  Elbow extension -24   -12  Wrist flexion    38  Wrist extension    18  Wrist ulnar deviation      Wrist radial deviation      Wrist pronation      Wrist supination      (Blank rows = not tested)  Passive ROM Right eval Left eval Right 10/18/22 Right 11/15/22  Shoulder flexion 65  76 86  Shoulder abduction       Shoulder adduction      Shoulder extension      Shoulder internal rotation      Shoulder external rotation      Elbow flexion      Elbow extension -17  -15 -10  Wrist flexion 46  56 60  Wrist extension 27  50 40  Wrist ulnar deviation      Wrist radial deviation      Wrist pronation      Wrist supination      (Blank rows = not tested)  Passive ROM Right 10/18/22 Right 11/15/22  Thumb MCP (0-60)    Thumb IP (0-80)    Thumb Radial abd/add (0-55)    Thumb Palmar abd/add (0-45)    Thumb Opposition to Small Finger    Index MCP (0-90)  0  Index PIP (0-100)  -  5  Index DIP (0-70)   -3  Long MCP (0-90)     Long PIP (0-100) -30  -30  Long DIP (0-70) -20 -20  Ring MCP (0-90)     Ring PIP (0-100) -25  -21  Ring DIP (0-70) -15  -8  Little MCP (0-90)     Little PIP (0-100)   -18  Little DIP (0-70)   -4  (Blank rows = not tested)   UPPER EXTREMITY MMT:   not assessed due to pain with any PROM, AROM    HAND FUNCTION: Loose gross grasp   COORDINATION: Box and Blocks:  Right 2blocks, Left 45 blocks. Modified to complete with RUE.  Pt able to grasp and move across to other side of box with barrier removed.   11/01/22: RUE 13 blocks  SENSATION: Mild decrease in R  EDEMA: mild edema in wrist hand, and fingers on RUE  OBSERVATIONS: Pt guarded with movement, hypersensitive to touch and PROM.     TODAY'S TREATMENT:                                   11/15/22: UB dressing: Pt reports still needing assist , especially with layers and clothing fasteners. LB dressing: Pt reports fear of falling impacting independence with LB dressing.  Reporting that sometimes she is able to complete more readily than others.  Pt reports that she is satisfied with current functional level with ADLs, and wants to focus solely on UE impairments. RUE measurements taken, see above for AROM and PROM measurements. Ther ex: reiterated composite finger flexion, finger extension, and wrist flexion/extension.  OT  providing demonstration and hand over hand for each.  Pt able to return demonstration of finger flexion/extension and then wrist flexion/extension with min cues for sustained hold 5-10 seconds.   11/01/22 WB through RUE while hand placed over half ball to accommodate for decreased finger extension.  Engaged in WB through [place] while in standing and reaching across midline with opposite UE to facilitate increased WB. OT providing support at elbow and forearm to further facilitate weight shift and weight bearing. Box and blocks: Completed 12 with RUE; pt demonstrating increased hand/finger grasp/release to pick up blocks.  Completed again with barrier in place with pt able to transfer 13 blocks in 1 min time limit.  Pt frequently bumping RUE into barrier on the way back across barrier. UE therapeutic exercise: OT instructed pt in seated shoulder flexion with elbow extension with use of hemi-walker on back 2 legs.  OT providing min cues for posture to further facilitate increased ROM.  Pt able to complete with fading cues and good attention to technique.   10/30/22 Splint fit: Pt with no c/o redness or areas of concern with wear of resting hand splint.  Pt reports unable to tolerate wear overnight yet, due to onset of pain with prolonged stretch.  Encouraged increased wear as tolerated to progress to overnight as able. Reiterated ability to modify fit of splint as able. Pegs: AAROM include shoulder flexion, elbow extension, and wrist flexion during grasp and placement of large pegs into peg board.  OT providing tactile cues at torso to facilitate midline sitting balance to eliminate compensatory trunk movements as utilized to increase functional reach.  Pt demonstrating active wrist extension to midline with placement and removal of pegs.  Pt demonstrating loose gross grasp and some tripod grasp as needed for handwriting and self-feeding.  OT providing tactile and verbal cues to facilitate increased attempts at  hand opening. Discussed PM&R and neurology for treatment of hypertonicity.  Pt asking questions about full/complete functional return of dominant RUE.  Discussed possibility of medication and/or alternative treatments to aid in treatment of hypertonicity.  Pt would require referral from PCP to establish pt with neurologist and/or PM&R. Handwriting:  OT educated on use of built up handle for handwriting and self-feeding.  Pt able to achieve tripod grasp to stabilize pen with built up handle.  Pt demonstrating decreased grasp strength and coordination to write name fully legibly.  OT provided pt with built up handle and pre-writing handouts to practice.  Encouraged pt to utilize RUE intermittently as able with self-feeding, with use of utensil with built up handle or for finger foods.   PATIENT EDUCATION: Education details: ongoing condition specific education and progressive exercises. Person educated: Patient and Spouse Education method: Explanation, Demonstration, Tactile cues, Verbal cues, and Handouts Education comprehension: verbalized understanding and needs further education  HOME EXERCISE PROGRAM: Access Code: [email or web address] URL: [email or web address] Date: 10/23/2022 Prepared by: [facility]  Exercises - Supine Shoulder Flexion AAROM with Dowel  - 1 x daily - 3 x weekly - 2 sets - 10 reps - Supine Shoulder Protraction with Dowel  - 1 x daily - 3 x weekly - 2 sets - 10 reps - Supine Shoulder Abduction AAROM with Dowel  - 1 x daily - 3 x weekly - 2 sets - 10 reps - Standing Single Bent Arm Shoulder Abduction   - 1 x daily - 3 x weekly - 2 sets - 10 reps - 5-10 sec hold - Hooklying Single Arm Chest Press  - 1 x daily - 3 x weekly - 2 sets - 10 reps - Seated Forearm Pronation Supination AROM  - 1 x daily - 7 x weekly - 2 sets - 10 reps - Hand PROM Finger Extension  - 1 x daily - 7 x weekly - 2 sets - 10 reps - Thumb AROM Opposition  - 1 x  daily - 7 x weekly - 1 sets - 5 reps - Seated Isolated Finger PIP Flexion AROM  - 1 x daily - 7 x weekly - 1 sets - 5 reps - Seated Finger Composite Flexion Stretch  - 1 x daily - 7 x weekly - 1 sets - 5 reps - Wrist Flexion and Extension Caregiver PROM  - 1 x daily - 7 x weekly - 2 sets - 10 reps - 5-10 sec hold   GOALS: Goals reviewed with patient? Yes  SHORT TERM GOALS: Target date: 10/13/22  Pt and spouse will be independent with ROM and coordination HEP. Baseline: Goal status: MET - 10/16/22  2.  Pt and spouse will verbalize understanding of supine and sitting positioning to decrease edema and pain in RUE. Baseline:  Goal status: MET - 10/16/22  3.  Pt will verbalize understanding of task modifications and/or potential AE needs to increase ease, safety, and independence w/ ADLs. Baseline:  Goal status: IN PROGRESS  4.  Pt will be able to complete UB dressing with min assist demonstrating/verbalizing understanding of hemi-dressing technique. Baseline:  Goal status: MET - 10/16/22   NEW SHORT TERM GOALS: Target date: 12/08/22  Pt and spouse will be independent with updated ROM and coordination HEP. Baseline: Goal status: INITIAL  2.  Pt will verbalize understanding of task modifications and/or potential AE needs to increase ease, safety,  and independence w/ ADLs. Baseline:  Goal status: INITIAL     LONG TERM GOALS: Target date: 11/10/22  Pt will be able to complete UB dressing with Supervision assist (to include donning/fastening bra PRN) demonstrating/verbalizing understanding of hemi-dressing technique. Baseline:  Goal status: NOT MET - 11/15/22  2.  Pt will be able to incorporate use of RUE at non-dominant level during LB dressing, to be completed at Mod I level. Baseline:  Goal status: NOT MET - 11/15/22  3.  Pt will demonstrate ability to pick up light weight object from table top as needed to increase engagement of RUE into ADLs and self-eeding. Baseline:  Goal  status: MET - 11/01/22  4.  Pt will demonstrate improved UE functional use for ADLs as evidenced by increasing box/ blocks score by 5 blocks with RUE, to be completed in standardized manner. Baseline: Right 2blocks, Left 45 blocks. Modified to complete with RUE.  Pt able to grasp and move across to other side of box with barrier removed.   Goal status: MET - 13 blocks with RUE over barrier on 11/01/22  5.  Pt will report decreased pain in dominant RUE interfering with ADLs/IADLs by decreased impairment score on Quick DASH. Baseline: 77% Goal status: NOT MET - 11/15/22  6.  Pt will be independent with splint wear and care PRN Baseline: splint needs TBD Goal status: MET - 11/01/22  NEW LONG TERM GOALS: Target date: 12/29/22  1.  Pt will demonstrate ability to demonstrate improved functional grasp in RUE to open new jar, increased handwriting, and self-feeding. Baseline:  Goal status: INITIAL  2.  Pt will demonstrate improved UE functional use for ADLs as evidenced by increasing box/ blocks score by 5 blocks with RUE, to be completed in standardized manner. Baseline:  13 blocks with RUE over barrier on 11/01/22 Goal status: INITIAL  3.  Pt will demonstrate improved shoulder ROM to allow for reach to moderate height as needed for ADLs/IADLs (washing back, putting on jacket, etc).  Baseline:  Goal status: INITIAL  4. Pt will demonstrate improved extension in long and ring finger to allow for more functional hand opening.  Baseline:  Goal status: INITIAL  5.  Pt will report decreased pain in dominant RUE interfering with ADLs/IADLs by decreased impairment score on Quick DASH. Baseline: 77% Goal status: INITIAL  6.  Pt will be independent with progressive splint wear and care PRN Baseline: modifications to splint TBD Goal status: INITIAL  ASSESSMENT:  CLINICAL IMPRESSION: Pt is demonstrating improved loose grasp, and ability to utilize built up handle for simple self-feeding and  beginning of handwriting however is still unable to maintain sustained grasp to open jars or pill bottles.  Pt continues to demonstrate decreased extension of fingers, especially ring and long finger, may benefit from more dynamic splinting needs TBD.  Pt continues to be limited by decreased shoulder flexion, elbow flexion/extension, and finger extension due to hypertonicity impacting use of RUE during ADLs and IADLs.  Pt reports that she is okay with her husband assisting her with dressing tasks, however would like improved function of hand to aid in opening containers, writing, self-feeding and possible return to driving.  PERFORMANCE DEFICITS: in functional skills including ADLs, IADLs, coordination, sensation, edema, tone, ROM, strength, pain, flexibility, Fine motor control, Gross motor control, balance, body mechanics, endurance, decreased knowledge of use of DME, skin integrity, vision, and UE functional use, cognitive skills including emotional and safety awareness, and psychosocial skills including coping strategies.   IMPAIRMENTS:  are limiting patient from ADLs, IADLs, and rest and sleep.   CO-MORBIDITIES: may have co-morbidities  that affects occupational performance. Patient will benefit from skilled OT to address above impairments and improve overall function.  MODIFICATION OR ASSISTANCE TO COMPLETE EVALUATION: Min-Moderate modification of tasks or assist with assess necessary to complete an evaluation.  OT OCCUPATIONAL PROFILE AND HISTORY: Detailed assessment: Review of records and additional review of physical, cognitive, psychosocial history related to current functional performance.  CLINICAL DECISION MAKING: Moderate - several treatment options, min-mod task modification necessary  REHAB POTENTIAL: Good  EVALUATION COMPLEXITY: Moderate    PLAN:  OT FREQUENCY: 2x/week  OT DURATION: 8 weeks  PLANNED INTERVENTIONS: self care/ADL training, therapeutic exercise, therapeutic  activity, neuromuscular re-education, manual therapy, passive range of motion, balance training, functional mobility training, splinting, paraffin, fluidotherapy, biofeedback, compression bandaging, moist heat, cryotherapy, contrast bath, patient/family education, visual/perceptual remediation/compensation, psychosocial skills training, coping strategies training, and DME and/or AE instructions  RECOMMENDED OTHER SERVICES: NA  CONSULTED AND AGREED WITH PLAN OF CARE: Patient and family member/caregiver  PLAN FOR NEXT SESSION: Review splint fit and make modification as needed.  AAROM or self-ROM to include shoulder flexion/extension and internal/external rotation, elbow, forearm.  Continue to focus on PROM, massage, and stretching.  Functional grasp and release.  Attempt handwriting and/or self-feeding.   Simonne Come, OTR/L 11/15/2022, 4:53 PM

## 2022-11-17 ENCOUNTER — Telehealth: Payer: Self-pay | Admitting: Cardiovascular Disease

## 2022-11-17 NOTE — Telephone Encounter (Signed)
Returned call to patient and reviewed her LDL with her. She was very appreciate of the call!

## 2022-11-17 NOTE — Telephone Encounter (Signed)
Pt calling because she would like to know her lab LDL level number (from 10/31/22)

## 2022-11-20 ENCOUNTER — Ambulatory Visit: Payer: Medicare PPO

## 2022-11-20 DIAGNOSIS — R2689 Other abnormalities of gait and mobility: Secondary | ICD-10-CM

## 2022-11-20 DIAGNOSIS — M6281 Muscle weakness (generalized): Secondary | ICD-10-CM | POA: Diagnosis not present

## 2022-11-20 DIAGNOSIS — I69354 Hemiplegia and hemiparesis following cerebral infarction affecting left non-dominant side: Secondary | ICD-10-CM | POA: Diagnosis not present

## 2022-11-20 DIAGNOSIS — E876 Hypokalemia: Secondary | ICD-10-CM | POA: Diagnosis not present

## 2022-11-20 DIAGNOSIS — M79601 Pain in right arm: Secondary | ICD-10-CM | POA: Diagnosis not present

## 2022-11-20 DIAGNOSIS — R278 Other lack of coordination: Secondary | ICD-10-CM | POA: Diagnosis not present

## 2022-11-20 DIAGNOSIS — R2681 Unsteadiness on feet: Secondary | ICD-10-CM | POA: Diagnosis not present

## 2022-11-20 NOTE — Addendum Note (Signed)
Addended by: Danie Binder on: 11/20/2022 03:00 PM   Modules accepted: Orders

## 2022-11-20 NOTE — Therapy (Signed)
OUTPATIENT PHYSICAL THERAPY TREATMENT NOTE   Patient Name: Angela Buchanan MRN: [id number] DOB:31-Dec-1950, 72 y.o., female Today's Date: 11/20/2022  PCP:  Sela Hilding, MD   REFERRING PROVIDER:  Sela Hilding, MD      END OF SESSION:   PT End of Session - 11/20/22 1252     Visit Number 13    Date for PT Re-Evaluation 01/19/23    Authorization - Visit Number 7    Authorization - Number of Visits 20    Progress Note Due on Visit 20    PT Start Time 1205    PT Stop Time 1252    PT Time Calculation (min) 47 min    Activity Tolerance Patient tolerated treatment well    Behavior During Therapy [facility] for tasks assessed/performed                      Past Medical History:  Diagnosis Date   Atypical chest pain 07/19/2021   Bigeminy    no current med.   Breast cancer ([place]) 06/2017   right   Bruises easily    CAD in native artery 10/31/2022   Carotid bruit 11/13/2019   Dental crowns present    History of diverticulitis    Hypertension    states under control with med., has been on med. x 5 yr.   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   Past Surgical History:  Procedure Laterality Date   ABDOMINAL HYSTERECTOMY     partial   BREAST LUMPECTOMY Right 06/22/2017   Malignant   BREAST LUMPECTOMY WITH RADIOACTIVE SEED AND SENTINEL LYMPH NODE BIOPSY Right 06/22/2017   Procedure: RIGHT BREAST LUMPECTOMY WITH RADIOACTIVE SEED AND RIGHT AXILLARY DEEP SENTINEL LYMPH NODE BIOPSY WITH BLUE DYE INJECTION;  Surgeon: Fanny Skates, MD;  Location: [place];  Service: General;  Laterality: Right;   BREAST LUMPECTOMY WITH RADIOACTIVE SEED LOCALIZATION Left 06/22/2017   Procedure: LEFT BREAST LUMPECTOMY  WITH RADIOACTIVE SEED LOCALIZATION;  Surgeon: Fanny Skates, MD;  Location: [place];  Service: General;  Laterality: Left;   CATARACT EXTRACTION W/ INTRAOCULAR LENS  IMPLANT, BILATERAL Bilateral    EXCISION OF BREAST BIOPSY Left 06/22/2017   benign   LOOP RECORDER INSERTION N/A 05/16/2022   Procedure: LOOP RECORDER INSERTION;  Surgeon: Vickie Epley, MD;  Location: [facility];  Service: Cardiovascular;  Laterality: N/A;   PERCUTANEOUS PINNING Right 06/22/2022   Procedure: PERCUTANEOUS PINNING OF RIGHT HIP;  Surgeon: Hiram Gash, MD;  Location: [facility];  Service: Orthopedics;  Laterality: Right;   TONSILLECTOMY     age 31   Patient Active Problem List   Diagnosis Date Noted   CAD in native artery 10/31/2022   Malnutrition of moderate degree 06/22/2022   Closed fracture of femur, neck ([place]) 06/21/2022   Urinary frequency 06/21/2022   Hypokalemia 06/21/2022   Heart murmur 06/21/2022   Hypothyroidism 06/21/2022   Dysphagia 05/17/2022   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   CVA (cerebral vascular accident) ([place]) 05/12/2022   Osteoporosis 01/24/2022   Atypical chest pain 07/19/2021   Temporomandibular joint (TMJ) pain 07/16/2020   Referred otalgia of both ears 07/16/2020   PAC (premature atrial contraction) 05/11/2020   PVC (premature ventricular contraction) 05/11/2020  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   Carotid bruit 11/13/2019   Pure hypercholesterolemia 11/13/2019   Malignant neoplasm of upper-outer quadrant of right breast in female, estrogen receptor positive ([place]) 05/22/2017   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   Diplopia 09/17/2014   Dizziness and giddiness 09/17/2014   Essential hypertension 07/22/2014   Chest pain 07/22/2014   Family history of heart disease 07/22/2014    REFERRING DIAG:  R29.6 (ICD-10-CM) - Repeated falls, closed fracture of Rt hip    THERAPY DIAG:  Muscle weakness (generalized)  Unsteadiness on feet  Other  abnormalities of gait and mobility  Abnormal posture  Rationale for Evaluation and Treatment Rehabilitation  PERTINENT HISTORY: hypertension, breast cancer, hyperlipidemia, Lt hip fracture secondary to fall 06/2022, CVA 06/2022   PRECAUTIONS:  Fall, Rt breast cancer with node removal, osteoporosis    SUBJECTIVE:                                                                                                                                                                                      SUBJECTIVE STATEMENT:  I haven't heard from the neurologist yet.  I am working on my balance. I looked at a cane at the store and I think I want a small base quad cane.    PAIN:  Are you having pain? Not right now but my right hand and arm can be very sore at times.    OBJECTIVE: (objective measures completed at initial evaluation unless otherwise dated)  DIAGNOSTIC FINDINGS: Rt hip fracture and ORIF     COGNITION: Overall cognitive status: Within functional limits for tasks assessed                         SENSATION: WFL     POSTURE: rounded shoulders, forward head, flexed trunk , and weight shift left   PALPATION: NA   LOWER EXTREMITY ROM: Rt hip limited by 50%, hamstrings limited by 50% bil.    LOWER EXTREMITY MMT:   MMT Right eval Right  09/27/22 Left eval Right 10/30/22 Left 10/30/22  Hip flexion 3+ 4- 4- 4- 4-  Hip extension         Hip abduction 3-  4    Hip adduction         Hip internal rotation         Hip external rotation         Knee flexion 3- 4- 3+    Knee extension 3+ 4 4- 4 painful hip 4-  Ankle dorsiflexion 3+ 4- 4- 4- 4  Ankle plantarflexion         Ankle inversion  Ankle eversion          (Blank rows = not tested)   FUNCTIONAL TESTS:  5 times sit to stand: not able to due per pt request secondary to Lt shoulder pain with sit to stand.  Max LT UE pain with sit to stand   Timed up and go (TUG): 1 min, 27 seconds   09/27/22: 5x sit to stand: 29  seconds with use of Lt hand  TUG: 27 seconds   10/30/22: 5x sit to stand: 23 sec use of LT hand     TUG: 26 sec with standard cane   11/20/22: 5x sit to stand: 18.68 seconds    TUG: 19.45 without device    3 minute walk test: 225 feet with a standard cane GAIT: Distance walked: 25 feet  Assistive device utilized: Hemi walker on Lt Level of assistance: SBA Comments: slow mobility, reduced trunk rotation, reduce Rt LE stance time and reduced Rt arm swing     TODAY'S TREATMENT:    11/20/22: Objective meaures taken for reassessment Nustep L3 x6 min with PT present to monitor and discuss progress  Sit to stand: x10 without UE support LAQ 2.5# Lt 2x10, Rt 2x10 with 5" hold Tandem stance: Rt and Lt- next to barre 3x10 seconds each Standing marching 2x10 with 2.5# weight added-standing on balance pad Stepping over hurdles at barre: forward and lateral leading with Rt and Lt with min UE support as needed Seated ball squeezes x20 Alternating step taps on 6" step with single hand support by PT x20   11/13/22: Nustep L3 x6 min with PT present to monitor and discuss progress  Sit to stand: x10 without UE support LAQ 2.5# Lt 2x10, Rt 2x10 with 5" hold Tandem stance: Rt and Lt- next to barre 3x10 seconds each Standing marching 2x10 with 2.5# weight added-standing on balance pad Stepping over hurdles at barre: forward and lateral leading with Rt and Lt with min UE support as needed Seated ball squeezes x20 Alternating step taps on 6"  step with single hand support by PT x20   11/08/22: Nustep L3 x6 min with PT present to monitor and discuss progress  Sit to stand: x10 without UE support LAQ 2.5# LT 2x10, Rt 2x10 with 5" hold Tandem stance: Rt and Lt- next to barre 3x10 seconds each Standing marching 2x10 with 2.5# weight added Stepping over hurdles at barre: forward and lateral leading with Rt and Lt with min UE support as needed Seated ball squeezes x20 Alternating step taps on 4" step with  single hand support by PT x20  Gait with standard cane: x200 feet- cueing for step length   PATIENT EDUCATION:  Education details: Access Code: [email or web address] Person educated: Patient Education method: Explanation, Demonstration, and Handouts Education comprehension: verbalized understanding and returned demonstration   HOME EXERCISE PROGRAM: Access Code: [email or web address] URL: [email or web address] Date: 09/27/2022 Prepared by: Claiborne Billings  Exercises - Seated Long Arc Quad  - 3 x daily - 7 x weekly - 2 sets - 10 reps - 5 hold - Seated March   - 3 x daily - 7 x weekly - 3 sets - 10 reps - Seated Heel Toe Raises   - 3 x daily - 7 x weekly - 2 sets - 10 reps - Seated Isometric Hip Adduction with Ball  - 3 x daily - 7 x weekly - 2 sets - 10 reps - Sit to Stand with Armchair  - 2 x daily - 7 x weekly - 2  sets - 5-10 reps - Standing Hip Abduction with Counter Support  - 1 x daily - 7 x weekly - 2 sets - 10 reps - Heel Raises with Counter Support  - 2 x daily - 7 x weekly - 2 sets - 10 reps  Patient Education - Walking with a Single General [place] - Modified 4 Point Gait Pattern  ASSESSMENT:   CLINICAL IMPRESSION:  Pt continues to work on strength and mobility to improve gait, endurance and safety.  All objective measures have improved, indicating improved function and reduced falls risk.  Pt is still at risk for fall and demonstrates endurance and functional deficits s/p CVA and hip fracture.  Pt with improved technique and independence with gait, balance tasks and exercise in the clinic.  Pt did well with balance exercises in the clinic and required stand by assistance for safety and cueing by PT. Patient will benefit from skilled PT to address the below impairments and improve overall function.   OBJECTIVE IMPAIRMENTS: Abnormal gait, decreased activity tolerance, decreased balance, decreased mobility, difficulty walking, decreased strength, decreased safety awareness, impaired perceived functional  ability, impaired flexibility, postural dysfunction, and pain.    ACTIVITY LIMITATIONS: carrying, lifting, sitting, standing, stairs, transfers, hygiene/grooming, and locomotion level   PARTICIPATION LIMITATIONS: meal prep, cleaning, laundry, driving, shopping, and community activity   PERSONAL FACTORS: Age, Past/current experiences, and 1-2 comorbidities: CVA, falls, Rt hip fracture with ORIF  are also affecting patient's functional outcome.    REHAB POTENTIAL: Good   CLINICAL DECISION MAKING: Evolving/moderate complexity   EVALUATION COMPLEXITY: Moderate     GOALS: Goals reviewed with patient? Yes   SHORT TERM GOALS: Target date: 09/06/2022   Be independent in initial HEP Baseline: Goal status: Goal met 08/14/22   2.  Improve LE strength to perform sit to stand with moderate Lt UE support Baseline:  Goal status: Goal met 08/28/22  3.  Perform TUG in < or = to 60 seconds to reduce falls risk Baseline: 27 seconds (09/27/22) Goal status: MET      LONG TERM GOALS: Target date:  01/19/23   Be independent in advanced HEP Baseline: independent in current HEP and further progress is needed  Goal status: in progress    2.  Perform TUG in < or = to 15 seconds to reduce falls risk Baseline: 19.45 seconds (11/20/22) Goal status: In progress    3.  ambulate > or = to 350 feet in 3 minutes to improve community ambulation and independence  Baseline: 225 (11/20/22) Goal status: NEW    4.  Ambulate with hemi walker with supervision or no guard due to improved gait Baseline: able to do this Goal status: MET   5.  perform 5x sit to stand in < or = to 14 seconds to reduce falls risk  Baseline: 18.68 seconds  (11/20/22)  Goal Status: NEW  6. Ambulate with cane for all distances and demonstrate independence due to improved stability  Baseline: using hemiwalker and will get cane soon.  Walked well with standard cane in the clinic  (11/20/22)  Goal status: INITIAL     PLAN:   PT  FREQUENCY: 1-2x/week   PT DURATION: 8 weeks   PLANNED INTERVENTIONS: Therapeutic exercises, Therapeutic activity, Neuromuscular re-education, Balance training, Gait training, Patient/Family education, Self Care, Joint mobilization, Stair training, Dry Needling, Electrical stimulation, Cryotherapy, Moist heat, Taping, Manual therapy, and Re-evaluation   PLAN FOR NEXT SESSION: Continue to work on gait , strength and balance  Sigurd Sos, PT  11/20/22 12:58 PM

## 2022-11-21 ENCOUNTER — Ambulatory Visit: Payer: Medicare PPO | Attending: Family Medicine | Admitting: Occupational Therapy

## 2022-11-21 ENCOUNTER — Telehealth (HOSPITAL_BASED_OUTPATIENT_CLINIC_OR_DEPARTMENT_OTHER): Payer: Self-pay

## 2022-11-21 DIAGNOSIS — E876 Hypokalemia: Secondary | ICD-10-CM

## 2022-11-21 DIAGNOSIS — M79601 Pain in right arm: Secondary | ICD-10-CM | POA: Insufficient documentation

## 2022-11-21 DIAGNOSIS — R278 Other lack of coordination: Secondary | ICD-10-CM | POA: Insufficient documentation

## 2022-11-21 DIAGNOSIS — I69354 Hemiplegia and hemiparesis following cerebral infarction affecting left non-dominant side: Secondary | ICD-10-CM | POA: Diagnosis not present

## 2022-11-21 LAB — BASIC METABOLIC PANEL
BUN/Creatinine Ratio: 10 — ABNORMAL LOW (ref 12–28)
BUN: 8 mg/dL (ref 8–27)
CO2: 20 mmol/L (ref 20–29)
Calcium: 10.4 mg/dL — ABNORMAL HIGH (ref 8.7–10.3)
Chloride: 102 mmol/L (ref 96–106)
Creatinine, Ser: 0.78 mg/dL (ref 0.57–1.00)
Glucose: 94 mg/dL (ref 70–99)
Potassium: 4.4 mmol/L (ref 3.5–5.2)
Sodium: 138 mmol/L (ref 134–144)
eGFR: 81 mL/min/{1.73_m2} (ref >59)

## 2022-11-21 NOTE — Telephone Encounter (Addendum)
Results called to patient who verbalizes understanding! Repeat BMP ordered and appointment notes updated.    ----- Message from Loel Dubonnet, NP sent at 11/21/2022  7:43 AM EST ----- Potassium level has improved to normal which is great! Would recommend repeat BMP On day of her appt at [facility] with the pharmacist.

## 2022-11-21 NOTE — Therapy (Signed)
OUTPATIENT OCCUPATIONAL THERAPY  Treatment Session  Patient Name: Angela Buchanan MRN: [id number] DOB:[birth year], 72 y.o., female Today's Date: 11/21/2022  PCP: Glenis Smoker, MD REFERRING PROVIDER: Glenis Smoker, MD       END OF SESSION:  OT End of Session - 11/21/22 1349     Visit Number 16    Number of Visits 29    Date for OT Re-Evaluation 12/29/22    Authorization Type [employer]    OT Start Time 1318    OT Stop Time 1400    OT Time Calculation (min) 42 min                           Past Medical History:  Diagnosis Date   Atypical chest pain 07/19/2021   Bigeminy    no current med.   Breast cancer ([place]) 06/2017   right   Bruises easily    CAD in native artery 10/31/2022   Carotid bruit 11/13/2019   Dental crowns present    History of diverticulitis    Hypertension    states under control with med., has been on med. x 5 yr.   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   Past Surgical History:  Procedure Laterality Date   ABDOMINAL HYSTERECTOMY     partial   BREAST LUMPECTOMY Right 06/22/2017   Malignant   BREAST LUMPECTOMY WITH RADIOACTIVE SEED AND SENTINEL LYMPH NODE BIOPSY Right 06/22/2017   Procedure: RIGHT BREAST LUMPECTOMY WITH RADIOACTIVE SEED AND RIGHT AXILLARY DEEP SENTINEL LYMPH NODE BIOPSY WITH BLUE DYE INJECTION;  Surgeon: Fanny Skates, MD;  Location: [facility];  Service: General;  Laterality: Right;   BREAST LUMPECTOMY WITH RADIOACTIVE SEED LOCALIZATION Left 06/22/2017   Procedure: LEFT BREAST LUMPECTOMY WITH RADIOACTIVE SEED LOCALIZATION;  Surgeon: Fanny Skates, MD;  Location: [facility];  Service: General;  Laterality: Left;    CATARACT EXTRACTION W/ INTRAOCULAR LENS  IMPLANT, BILATERAL Bilateral    EXCISION OF BREAST BIOPSY Left 06/22/2017   benign   LOOP RECORDER INSERTION N/A 05/16/2022   Procedure: LOOP RECORDER INSERTION;  Surgeon: Vickie Epley, MD;  Location: [facility];  Service: Cardiovascular;  Laterality: N/A;   PERCUTANEOUS PINNING Right 06/22/2022   Procedure: PERCUTANEOUS PINNING OF RIGHT HIP;  Surgeon: Hiram Gash, MD;  Location: [facility];  Service: Orthopedics;  Laterality: Right;   TONSILLECTOMY     age 47   Patient Active Problem List   Diagnosis Date Noted   CAD in native artery 10/31/2022   Malnutrition of moderate degree 06/22/2022   Closed fracture of femur, neck ([place]) 06/21/2022   Urinary frequency 06/21/2022   Hypokalemia 06/21/2022   Heart murmur 06/21/2022   Hypothyroidism 06/21/2022   Dysphagia 05/17/2022   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   CVA (cerebral vascular accident) ([place]) 05/12/2022   Osteoporosis 01/24/2022   Atypical chest pain 07/19/2021   Temporomandibular joint (TMJ) pain 07/16/2020   Referred otalgia of both ears 07/16/2020   PAC (premature atrial contraction) 05/11/2020   PVC (premature ventricular contraction) 05/11/2020   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   Carotid bruit 11/13/2019   Pure hypercholesterolemia 11/13/2019   Malignant neoplasm of upper-outer quadrant of right breast in female, estrogen  receptor positive ([place]) 05/22/2017   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 Pt accompanied by: self and significant other (Husband, [name])  PERTINENT HISTORY: hypertension, breast cancer, hyperlipidemia, Lt hip fracture secondary to fall 06/2022, CVA 06/2022   PRECAUTIONS: Fall, Rt breast cancer with node removal, osteoporosis  WEIGHT BEARING RESTRICTIONS: No  PAIN:  Are you having pain? No  FALLS: Has patient fallen in last 6 months? Yes. Number of falls 1 fall, one week after d/c home from SNF  LIVING ENVIRONMENT: Lives with: lives with their spouse Lives in: House/apartment Stairs: Yes: External: 2 steps; on left going up Has following equipment at home: Gilford Rile - 2 wheeled, Hemi walker, Wheelchair (manual), shower chair, and Grab bars  PLOF: Independent and Independent with basic ADLs  PATIENT GOALS: to get use of my R arm again  OBJECTIVE:   HAND DOMINANCE: Right  ADLs: Transfers/ambulation related to ADLs: Mod I with hemi-walker Eating: using L hand for eating, husband is assisting with cutting up food Grooming: Mod I, using L hand UB Dressing: Mod assist LB Dressing: Max assist Toileting: Mod I Bathing: Supervision - Mod I Tub Shower transfers: Dentist: Shower seat with back and Grab bars  UPPER EXTREMITY ROM:    Active ROM Right eval Left eval Right 10/18/22  Right 11/15/22  Shoulder flexion 54   82  Shoulder abduction      Shoulder adduction      Shoulder extension      Shoulder internal rotation      Shoulder external rotation      Elbow flexion 108   132  Elbow extension -24   -12  Wrist flexion    38  Wrist extension    18  Wrist ulnar deviation      Wrist radial deviation      Wrist pronation      Wrist supination      (Blank rows = not tested)  Passive ROM Right eval Left eval Right 10/18/22 Right 11/15/22  Shoulder flexion 65  76 86  Shoulder abduction      Shoulder adduction      Shoulder  extension      Shoulder internal rotation      Shoulder external rotation      Elbow flexion      Elbow extension -17  -15 -10  Wrist flexion 46  56 60  Wrist extension 27  50 40  Wrist ulnar deviation      Wrist radial deviation      Wrist pronation      Wrist supination      (Blank rows = not tested)  Passive ROM Right 10/18/22 Right 11/15/22  Thumb MCP (0-60)    Thumb IP (0-80)    Thumb Radial abd/add (0-55)    Thumb Palmar abd/add (0-45)    Thumb Opposition to Small Finger    Index MCP (0-90)  0  Index PIP (0-100)  -5  Index  DIP (0-70)   -3  Long MCP (0-90)     Long PIP (0-100) -30  -30  Long DIP (0-70) -20 -20  Ring MCP (0-90)     Ring PIP (0-100) -25  -21  Ring DIP (0-70) -15  -8  Little MCP (0-90)     Little PIP (0-100)   -18  Little DIP (0-70)   -4  (Blank rows = not tested)   UPPER EXTREMITY MMT:   not assessed due to pain with any PROM, AROM    HAND FUNCTION: Loose gross grasp   COORDINATION: Box and Blocks:  Right 2blocks, Left 45 blocks. Modified to complete with RUE.  Pt able to grasp and move across to other side of box with barrier removed.   11/01/22: RUE 13 blocks  SENSATION: Mild decrease in R  EDEMA: mild edema in wrist hand, and fingers on RUE  OBSERVATIONS: Pt guarded with movement, hypersensitive to touch and PROM.     TODAY'S TREATMENT:                                   11/21/22 Splint:  OT modified fit of splint to facilitate increased extension of fingers at DIP.  Modified placement of velcro strapping to thumb to facilitate improved positioning of thumb and whole hand.   PROM: OT providing blocked facilitation for extension to long and ring finger at DIP and then PIP with pt reporting tightness and min pain with stretching.   Grasp: gross grasp and precision pinch with picking up large jacks with R hand. Pt initially utilizing gross grasp, however able to transition to precision pinch with focus on thumb opposition to index finger.  Pt  demonstrating decreased extension post precision. Functional grasp: picking up, stacking, and bringing cups to mouth with focus on use of RUE as well as use of BUE together.  Pt able to achieve increased extension of fingers to grasp cup in R hand and demonstrate motor control to bring to mouth, however unable to release grasp on cup due to difficulty with further extension to release cup.  Discussed use of BUE when opening containers, drinking from cup with straw, and increasing use of RUE with self-feeding.   11/15/22: UB dressing: Pt reports still needing assist , especially with layers and clothing fasteners. LB dressing: Pt reports fear of falling impacting independence with LB dressing.  Reporting that sometimes she is able to complete more readily than others.  Pt reports that she is satisfied with current functional level with ADLs, and wants to focus solely on UE impairments. RUE measurements taken, see above for AROM and PROM measurements. Ther ex: reiterated composite finger flexion, finger extension, and wrist flexion/extension.  OT providing demonstration and hand over hand for each.  Pt able to return demonstration of finger flexion/extension and then wrist flexion/extension with min cues for sustained hold 5-10 seconds.   11/01/22 WB through RUE while hand placed over half ball to accommodate for decreased finger extension.  Engaged in WB through [place] while in standing and reaching across midline with opposite UE to facilitate increased WB. OT providing support at elbow and forearm to further facilitate weight shift and weight bearing. Box and blocks: Completed 12 with RUE; pt demonstrating increased hand/finger grasp/release to pick up blocks.  Completed again with barrier in place with pt able to transfer 13 blocks in 1 min time limit.  Pt frequently bumping RUE into  barrier on the way back across barrier. UE therapeutic exercise: OT instructed pt in seated shoulder flexion with elbow  extension with use of hemi-walker on back 2 legs.  OT providing min cues for posture to further facilitate increased ROM.  Pt able to complete with fading cues and good attention to technique.   PATIENT EDUCATION: Education details: ongoing condition specific education and progressive exercises. Person educated: Patient and Spouse Education method: Explanation, Demonstration, Tactile cues, Verbal cues, and Handouts Education comprehension: verbalized understanding and needs further education  HOME EXERCISE PROGRAM: Access Code: [email or web address] URL: [email or web address] Date: 10/23/2022 Prepared by: [facility]  Exercises - Supine Shoulder Flexion AAROM with Dowel  - 1 x daily - 3 x weekly - 2 sets - 10 reps - Supine Shoulder Protraction with Dowel  - 1 x daily - 3 x weekly - 2 sets - 10 reps - Supine Shoulder Abduction AAROM with Dowel  - 1 x daily - 3 x weekly - 2 sets - 10 reps - Standing Single Bent Arm Shoulder Abduction   - 1 x daily - 3 x weekly - 2 sets - 10 reps - 5-10 sec hold - Hooklying Single Arm Chest Press  - 1 x daily - 3 x weekly - 2 sets - 10 reps - Seated Forearm Pronation Supination AROM  - 1 x daily - 7 x weekly - 2 sets - 10 reps - Hand PROM Finger Extension  - 1 x daily - 7 x weekly - 2 sets - 10 reps - Thumb AROM Opposition  - 1 x daily - 7 x weekly - 1 sets - 5 reps - Seated Isolated Finger PIP Flexion AROM  - 1 x daily - 7 x weekly - 1 sets - 5 reps - Seated Finger Composite Flexion Stretch  - 1 x daily - 7 x weekly - 1 sets - 5 reps - Wrist Flexion and Extension Caregiver PROM  - 1 x daily - 7 x weekly - 2 sets - 10 reps - 5-10 sec hold   GOALS: Goals reviewed with patient? Yes  SHORT TERM GOALS: Target date: 12/08/22  Pt and spouse will be independent with updated ROM and coordination HEP. Baseline: Goal status: IN PROGRESS  2.  Pt will verbalize understanding of task modifications and/or potential AE needs to  increase ease, safety, and independence w/ ADLs. Baseline:  Goal status: IN PROGRESS     LONG TERM GOALS: Target date: 12/29/22  1.  Pt will demonstrate ability to demonstrate improved functional grasp in RUE to open new jar, increased handwriting, and self-feeding. Baseline:  Goal status: IN PROGRESS  2.  Pt will demonstrate improved UE functional use for ADLs as evidenced by increasing box/ blocks score by 5 blocks with RUE, to be completed in standardized manner. Baseline:  13 blocks with RUE over barrier on 11/01/22 Goal status: IN PROGRESS  3.  Pt will demonstrate improved shoulder ROM to allow for reach to moderate height as needed for ADLs/IADLs (washing back, putting on jacket, etc).  Baseline:  Goal status: IN PROGRESS  4. Pt will demonstrate improved extension in long and ring finger to allow for more functional hand opening.  Baseline:  Goal status: IN PROGRESS  5.  Pt will report decreased pain in dominant RUE interfering with ADLs/IADLs by decreased impairment score on Quick DASH. Baseline: 77% Goal status: IN PROGRESS  6.  Pt will be independent with progressive splint wear and care PRN  Baseline: modifications to splint TBD Goal status: IN PROGRESS  ASSESSMENT:  CLINICAL IMPRESSION: Pt demonstrating improved functional grasp and ability to pick up with gross grasp and precision pinch.  Pt pleased with small modifications to splint fit to further facilitate finger extension and thumb opposition.  Pt continues to benefit from encouragement to increase functional use of RUE into self-care tasks and explanation of typical recovery process in regards to benefits of mutti-disciplinary approach with PM&R in addition to OT/PT.  PERFORMANCE DEFICITS: in functional skills including ADLs, IADLs, coordination, sensation, edema, tone, ROM, strength, pain, flexibility, Fine motor control, Gross motor control, balance, body mechanics, endurance, decreased knowledge of use of DME,  skin integrity, vision, and UE functional use, cognitive skills including emotional and safety awareness, and psychosocial skills including coping strategies.   IMPAIRMENTS: are limiting patient from ADLs, IADLs, and rest and sleep.   CO-MORBIDITIES: may have co-morbidities  that affects occupational performance. Patient will benefit from skilled OT to address above impairments and improve overall function.  MODIFICATION OR ASSISTANCE TO COMPLETE EVALUATION: Min-Moderate modification of tasks or assist with assess necessary to complete an evaluation.  OT OCCUPATIONAL PROFILE AND HISTORY: Detailed assessment: Review of records and additional review of physical, cognitive, psychosocial history related to current functional performance.  CLINICAL DECISION MAKING: Moderate - several treatment options, min-mod task modification necessary  REHAB POTENTIAL: Good  EVALUATION COMPLEXITY: Moderate    PLAN:  OT FREQUENCY: 2x/week  OT DURATION: 8 weeks  PLANNED INTERVENTIONS: self care/ADL training, therapeutic exercise, therapeutic activity, neuromuscular re-education, manual therapy, passive range of motion, balance training, functional mobility training, splinting, paraffin, fluidotherapy, biofeedback, compression bandaging, moist heat, cryotherapy, contrast bath, patient/family education, visual/perceptual remediation/compensation, psychosocial skills training, coping strategies training, and DME and/or AE instructions  RECOMMENDED OTHER SERVICES: NA  CONSULTED AND AGREED WITH PLAN OF CARE: Patient and family member/caregiver  PLAN FOR NEXT SESSION: Review splint fit and make modification as needed.  AAROM or self-ROM to include shoulder flexion/extension and internal/external rotation, elbow, forearm.  Continue to focus on PROM, massage, and stretching.  Functional grasp and release.  Attempt handwriting and/or self-feeding.   Simonne Come, OTR/L 11/21/2022, 1:49 PM

## 2022-11-22 ENCOUNTER — Ambulatory Visit (INDEPENDENT_AMBULATORY_CARE_PROVIDER_SITE_OTHER): Payer: Medicare PPO

## 2022-11-22 DIAGNOSIS — I639 Cerebral infarction, unspecified: Secondary | ICD-10-CM

## 2022-11-22 LAB — CUP PACEART REMOTE DEVICE CHECK
Date Time Interrogation Session: 20240221113800
Implantable Pulse Generator Implant Date: 20230815
Pulse Gen Serial Number: 183424

## 2022-11-24 ENCOUNTER — Ambulatory Visit: Payer: Medicare PPO | Admitting: Occupational Therapy

## 2022-11-28 ENCOUNTER — Ambulatory Visit: Payer: Medicare PPO

## 2022-11-28 ENCOUNTER — Ambulatory Visit: Payer: Medicare PPO | Admitting: Occupational Therapy

## 2022-11-28 DIAGNOSIS — I69354 Hemiplegia and hemiparesis following cerebral infarction affecting left non-dominant side: Secondary | ICD-10-CM

## 2022-11-28 DIAGNOSIS — M79601 Pain in right arm: Secondary | ICD-10-CM | POA: Diagnosis not present

## 2022-11-28 DIAGNOSIS — R2689 Other abnormalities of gait and mobility: Secondary | ICD-10-CM

## 2022-11-28 DIAGNOSIS — R278 Other lack of coordination: Secondary | ICD-10-CM

## 2022-11-28 DIAGNOSIS — R2681 Unsteadiness on feet: Secondary | ICD-10-CM | POA: Diagnosis not present

## 2022-11-28 DIAGNOSIS — M6281 Muscle weakness (generalized): Secondary | ICD-10-CM | POA: Diagnosis not present

## 2022-11-28 NOTE — Therapy (Signed)
OUTPATIENT OCCUPATIONAL THERAPY  Treatment Session  Patient Name: Angela Buchanan MRN: [id number] DOB:1951/03/12, 72 y.o., female Today's Date: 11/28/2022  PCP: Glenis Smoker, MD REFERRING PROVIDER: Glenis Smoker, MD       END OF SESSION:  OT End of Session - 11/28/22 1324     Visit Number 17    Number of Visits 29    Date for OT Re-Evaluation 12/29/22    Authorization Type [employer]    OT Start Time 1320    OT Stop Time 1400    OT Time Calculation (min) 40 min                           Past Medical History:  Diagnosis Date   Atypical chest pain 07/19/2021   Bigeminy    no current med.   Breast cancer ([place]) 06/2017   right   Bruises easily    CAD in native artery 10/31/2022   Carotid bruit 11/13/2019   Dental crowns present    History of diverticulitis    Hypertension    states under control with med., has been on med. x 5 yr.   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   Past Surgical History:  Procedure Laterality Date   ABDOMINAL HYSTERECTOMY     partial   BREAST LUMPECTOMY Right 06/22/2017   Malignant   BREAST LUMPECTOMY WITH RADIOACTIVE SEED AND SENTINEL LYMPH NODE BIOPSY Right 06/22/2017   Procedure: RIGHT BREAST LUMPECTOMY WITH RADIOACTIVE SEED AND RIGHT AXILLARY DEEP SENTINEL LYMPH NODE BIOPSY WITH BLUE DYE INJECTION;  Surgeon: Fanny Skates, MD;  Location: [facility];  Service: General;  Laterality: Right;   BREAST LUMPECTOMY WITH RADIOACTIVE SEED LOCALIZATION Left 06/22/2017   Procedure: LEFT BREAST LUMPECTOMY WITH RADIOACTIVE SEED LOCALIZATION;  Surgeon: Fanny Skates, MD;  Location: [facility];  Service: General;  Laterality: Left;    CATARACT EXTRACTION W/ INTRAOCULAR LENS  IMPLANT, BILATERAL Bilateral    EXCISION OF BREAST BIOPSY Left 06/22/2017   benign   LOOP RECORDER INSERTION N/A 05/16/2022   Procedure: LOOP RECORDER INSERTION;  Surgeon: Vickie Epley, MD;  Location: [facility];  Service: Cardiovascular;  Laterality: N/A;   PERCUTANEOUS PINNING Right 06/22/2022   Procedure: PERCUTANEOUS PINNING OF RIGHT HIP;  Surgeon: Hiram Gash, MD;  Location: [facility];  Service: Orthopedics;  Laterality: Right;   TONSILLECTOMY     age 31   Patient Active Problem List   Diagnosis Date Noted   CAD in native artery 10/31/2022   Malnutrition of moderate degree 06/22/2022   Closed fracture of femur, neck ([place]) 06/21/2022   Urinary frequency 06/21/2022   Hypokalemia 06/21/2022   Heart murmur 06/21/2022   Hypothyroidism 06/21/2022   Dysphagia 05/17/2022   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   CVA (cerebral vascular accident) ([place]) 05/12/2022   Osteoporosis 01/24/2022   Atypical chest pain 07/19/2021   Temporomandibular joint (TMJ) pain 07/16/2020   Referred otalgia of both ears 07/16/2020   PAC (premature atrial contraction) 05/11/2020   PVC (premature ventricular contraction) 05/11/2020   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   Carotid bruit 11/13/2019   Pure hypercholesterolemia 11/13/2019   Malignant neoplasm of upper-outer quadrant of right breast in female, estrogen  receptor positive ([place]) 05/22/2017   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 Pt accompanied by: self and significant other (Husband, [name])  PERTINENT HISTORY: hypertension, breast cancer, hyperlipidemia, Lt hip fracture secondary to fall 06/2022, CVA 06/2022   PRECAUTIONS: Fall, Rt breast cancer with node removal, osteoporosis  WEIGHT BEARING RESTRICTIONS: No  PAIN:  Are you having pain? No  FALLS: Has patient fallen in last 6 months? Yes. Number of falls 1 fall, one week after d/c home from SNF  LIVING ENVIRONMENT: Lives with: lives with their spouse Lives in: House/apartment Stairs: Yes: External: 2 steps; on left going up Has following equipment at home: Gilford Rile - 2 wheeled, Hemi walker, Wheelchair (manual), shower chair, and Grab bars  PLOF: Independent and Independent with basic ADLs  PATIENT GOALS: to get use of my R arm again  OBJECTIVE:   HAND DOMINANCE: Right  ADLs: Transfers/ambulation related to ADLs: Mod I with hemi-walker Eating: using L hand for eating, husband is assisting with cutting up food Grooming: Mod I, using L hand UB Dressing: Mod assist LB Dressing: Max assist Toileting: Mod I Bathing: Supervision - Mod I Tub Shower transfers: Dentist: Shower seat with back and Grab bars  UPPER EXTREMITY ROM:    Active ROM Right eval Left eval Right 10/18/22  Right 11/15/22  Shoulder flexion 54   82  Shoulder abduction      Shoulder adduction      Shoulder extension      Shoulder internal rotation      Shoulder external rotation      Elbow flexion 108   132  Elbow extension -24   -12  Wrist flexion    38  Wrist extension    18  Wrist ulnar deviation      Wrist radial deviation      Wrist pronation      Wrist supination      (Blank rows = not tested)  Passive ROM Right eval Left eval Right 10/18/22 Right 11/15/22  Shoulder flexion 65  76 86  Shoulder abduction      Shoulder  adduction      Shoulder extension      Shoulder internal rotation      Shoulder external rotation      Elbow flexion      Elbow extension -17  -15 -10  Wrist flexion 46  56 60  Wrist extension 27  50 40  Wrist ulnar deviation      Wrist radial deviation      Wrist pronation      Wrist supination      (Blank rows = not tested)  Passive ROM Right 10/18/22 Right 11/15/22  Thumb MCP (0-60)    Thumb IP (0-80)    Thumb Radial abd/add (0-55)    Thumb Palmar abd/add (0-45)    Thumb Opposition to Small Finger    Index MCP (0-90)  0  Index PIP (0-100)  -5  Index DIP (0-70)   -3  Long MCP (0-90)     Long PIP (0-100) -30  -30  Long DIP (0-70) -20 -20  Ring MCP (0-90)     Ring PIP (0-100) -25  -21  Ring DIP (0-70) -15  -8  Little MCP (0-90)     Little PIP (0-100)   -18  Little DIP (0-70)   -4  (Blank rows = not tested)   UPPER EXTREMITY MMT:   not assessed due to pain with any PROM, AROM    HAND FUNCTION: Loose gross grasp   COORDINATION: Box and Blocks:  Right 2blocks, Left 45 blocks. Modified to complete with RUE.  Pt able to grasp and move across to other side of box with barrier removed.   11/01/22: RUE 13 blocks  SENSATION: Mild decrease in R  EDEMA: mild edema in wrist hand, and fingers on RUE  OBSERVATIONS: Pt guarded with movement, hypersensitive to touch and PROM.     TODAY'S TREATMENT:                                   11/28/22 UE ROM: facilitated increased shoulder ROM, elbow extension, and grip with picking up bean bags and placing to matching targets.  Pt demonstrating gross grasp on bean bag, but improved shoulder flexion and elbow extension to reach towards each matching colored spot incorporating crossing midline and reaching outside BOS.  OT increased challenge to incorporating supination when releasing bean bags into basket.  Pt initially demonstrating 60% of supination and increased difficulty with releasing bean bags while in supination.  OT modifying task  to facilitate supination and then return to neutral prior to releasing to allow for increased release of grasp.  Pt recruiting mild compensatory trunk movement to increase supination.   Towel scrunches: OT instructing pt to utilize finger flexion and extension to scrunch paper towel into fist.  Pt demonstrating difficulty with extension, especially digit 3 and 4, when opening/adjusting to continue to gather towel into hand.  OT providing cues for hand placement for improved technique, still with difficulty with finger extension.   11/21/22 Splint:  OT modified fit of splint to facilitate increased extension of fingers at DIP.  Modified placement of velcro strapping to thumb to facilitate improved positioning of thumb and whole hand.   PROM: OT providing blocked facilitation for extension to long and ring finger at DIP and then PIP with pt reporting tightness and min pain with stretching.   Grasp: gross grasp and precision pinch with picking up large jacks with R hand. Pt initially utilizing gross grasp, however able to transition to precision pinch with focus on thumb opposition to index finger.  Pt demonstrating decreased extension post precision. Functional grasp: picking up, stacking, and bringing cups to mouth with focus on use of RUE as well as use of BUE together.  Pt able to achieve increased extension of fingers to grasp cup in R hand and demonstrate motor control to bring to mouth, however unable to release grasp on cup due to difficulty with further extension to release cup.  Discussed use of BUE when opening containers, drinking from cup with straw, and increasing use of RUE with self-feeding.   11/15/22: UB dressing: Pt reports still needing assist , especially with layers and clothing fasteners. LB dressing: Pt reports fear of falling impacting independence with LB dressing.  Reporting that sometimes  she is able to complete more readily than others.  Pt reports that she is satisfied with  current functional level with ADLs, and wants to focus solely on UE impairments. RUE measurements taken, see above for AROM and PROM measurements. Ther ex: reiterated composite finger flexion, finger extension, and wrist flexion/extension.  OT providing demonstration and hand over hand for each.  Pt able to return demonstration of finger flexion/extension and then wrist flexion/extension with min cues for sustained hold 5-10 seconds.   11/01/22 WB through RUE while hand placed over half ball to accommodate for decreased finger extension.  Engaged in WB through [place] while in standing and reaching across midline with opposite UE to facilitate increased WB. OT providing support at elbow and forearm to further facilitate weight shift and weight bearing. Box and blocks: Completed 12 with RUE; pt demonstrating increased hand/finger grasp/release to pick up blocks.  Completed again with barrier in place with pt able to transfer 13 blocks in 1 min time limit.  Pt frequently bumping RUE into barrier on the way back across barrier. UE therapeutic exercise: OT instructed pt in seated shoulder flexion with elbow extension with use of hemi-walker on back 2 legs.  OT providing min cues for posture to further facilitate increased ROM.  Pt able to complete with fading cues and good attention to technique.   PATIENT EDUCATION: Education details: ongoing condition specific education and progressive exercises. Person educated: Patient and Spouse Education method: Explanation, Demonstration, Tactile cues, Verbal cues, and Handouts Education comprehension: verbalized understanding and needs further education  HOME EXERCISE PROGRAM: Access Code: [email or web address] URL: [email or web address] Date: 10/23/2022 Prepared by: [facility]  Exercises - Supine Shoulder Flexion AAROM with Dowel  - 1 x daily - 3 x weekly - 2 sets - 10 reps - Supine Shoulder Protraction with Dowel  - 1  x daily - 3 x weekly - 2 sets - 10 reps - Supine Shoulder Abduction AAROM with Dowel  - 1 x daily - 3 x weekly - 2 sets - 10 reps - Standing Single Bent Arm Shoulder Abduction   - 1 x daily - 3 x weekly - 2 sets - 10 reps - 5-10 sec hold - Hooklying Single Arm Chest Press  - 1 x daily - 3 x weekly - 2 sets - 10 reps - Seated Forearm Pronation Supination AROM  - 1 x daily - 7 x weekly - 2 sets - 10 reps - Hand PROM Finger Extension  - 1 x daily - 7 x weekly - 2 sets - 10 reps - Thumb AROM Opposition  - 1 x daily - 7 x weekly - 1 sets - 5 reps - Seated Isolated Finger PIP Flexion AROM  - 1 x daily - 7 x weekly - 1 sets - 5 reps - Seated Finger Composite Flexion Stretch  - 1 x daily - 7 x weekly - 1 sets - 5 reps - Wrist Flexion and Extension Caregiver PROM  - 1 x daily - 7 x weekly - 2 sets - 10 reps - 5-10 sec hold   GOALS: Goals reviewed with patient? Yes  SHORT TERM GOALS: Target date: 12/08/22  Pt and spouse will be independent with updated ROM and coordination HEP. Baseline: Goal status: IN PROGRESS  2.  Pt will verbalize understanding of task modifications and/or potential AE needs to increase ease, safety, and independence w/ ADLs. Baseline:  Goal status: IN PROGRESS     LONG TERM  GOALS: Target date: 12/29/22  1.  Pt will demonstrate ability to demonstrate improved functional grasp in RUE to open new jar, increased handwriting, and self-feeding. Baseline:  Goal status: IN PROGRESS  2.  Pt will demonstrate improved UE functional use for ADLs as evidenced by increasing box/ blocks score by 5 blocks with RUE, to be completed in standardized manner. Baseline:  13 blocks with RUE over barrier on 11/01/22 Goal status: IN PROGRESS  3.  Pt will demonstrate improved shoulder ROM to allow for reach to moderate height as needed for ADLs/IADLs (washing back, putting on jacket, etc).  Baseline:  Goal status: IN PROGRESS  4. Pt will demonstrate improved extension in long and ring finger  to allow for more functional hand opening.  Baseline:  Goal status: IN PROGRESS  5.  Pt will report decreased pain in dominant RUE interfering with ADLs/IADLs by decreased impairment score on Quick DASH. Baseline: 77% Goal status: IN PROGRESS  6.  Pt will be independent with progressive splint wear and care PRN Baseline: modifications to splint TBD Goal status: IN PROGRESS  ASSESSMENT:  CLINICAL IMPRESSION: Pt demonstrating improved functional grasp and ability to pick up with gross grasp and precision pinch.  Pt pleased with small modifications to splint fit to further facilitate finger extension and thumb opposition.  Pt continues to benefit from encouragement to increase functional use of RUE into self-care tasks and explanation of typical recovery process in regards to benefits of mutti-disciplinary approach with PM&R in addition to OT/PT.  PERFORMANCE DEFICITS: in functional skills including ADLs, IADLs, coordination, sensation, edema, tone, ROM, strength, pain, flexibility, Fine motor control, Gross motor control, balance, body mechanics, endurance, decreased knowledge of use of DME, skin integrity, vision, and UE functional use, cognitive skills including emotional and safety awareness, and psychosocial skills including coping strategies.   IMPAIRMENTS: are limiting patient from ADLs, IADLs, and rest and sleep.   CO-MORBIDITIES: may have co-morbidities  that affects occupational performance. Patient will benefit from skilled OT to address above impairments and improve overall function.  MODIFICATION OR ASSISTANCE TO COMPLETE EVALUATION: Min-Moderate modification of tasks or assist with assess necessary to complete an evaluation.  OT OCCUPATIONAL PROFILE AND HISTORY: Detailed assessment: Review of records and additional review of physical, cognitive, psychosocial history related to current functional performance.  CLINICAL DECISION MAKING: Moderate - several treatment options,  min-mod task modification necessary  REHAB POTENTIAL: Good  EVALUATION COMPLEXITY: Moderate    PLAN:  OT FREQUENCY: 2x/week  OT DURATION: 8 weeks  PLANNED INTERVENTIONS: self care/ADL training, therapeutic exercise, therapeutic activity, neuromuscular re-education, manual therapy, passive range of motion, balance training, functional mobility training, splinting, paraffin, fluidotherapy, biofeedback, compression bandaging, moist heat, cryotherapy, contrast bath, patient/family education, visual/perceptual remediation/compensation, psychosocial skills training, coping strategies training, and DME and/or AE instructions  RECOMMENDED OTHER SERVICES: NA  CONSULTED AND AGREED WITH PLAN OF CARE: Patient and family member/caregiver  PLAN FOR NEXT SESSION: Review splint fit and make modification as needed.  AAROM or self-ROM to include shoulder flexion/extension and internal/external rotation, elbow, forearm.  Continue to focus on PROM, massage, and stretching.  Functional grasp and release.  Attempt handwriting and/or self-feeding.   Simonne Come, OTR/L 11/28/2022, 1:26 PM

## 2022-11-28 NOTE — Patient Instructions (Signed)
Grasp and reach activities: Pick up items (bean bags, wash cloths, sponges, etc) and place in semi-circular pattern around yourself when seated at table top.  Focus on shoulder and elbow movement to increase reach.  Focus on grasp of hand on item as well as release of item.    A. Increased challenge: Once item is in hand, rotate forearm to palm up to release item.  2.   Scrunch a paper towel into hand with focus on bending and straightening fingers to ball towel into palm of hand.  Attempt straightening one finger at a time to then scrunch more.    A. If unable to continually scrunch, then scrunch one time and focus on full extension of fingers.  Can use Left hand to aid in straightening after attempting without help from other hand.

## 2022-11-28 NOTE — Therapy (Signed)
OUTPATIENT PHYSICAL THERAPY TREATMENT NOTE   Patient Name: Angela Buchanan MRN: [id number] DOB:11-May-1951, 72 y.o., female Today's Date: 11/28/2022  PCP:  Sela Hilding, MD   REFERRING PROVIDER:  Sela Hilding, MD      END OF SESSION:   PT End of Session - 11/28/22 1309     Visit Number 14    Date for PT Re-Evaluation 01/19/23    Authorization Type 20 visitis 11/8-2/21/24    Authorization - Visit Number 14    Authorization - Number of Visits 36    Progress Note Due on Visit 20    PT Start Time 1232    PT Stop Time 1312    PT Time Calculation (min) 40 min    Activity Tolerance Patient tolerated treatment well    Behavior During Therapy [facility] for tasks assessed/performed                       Past Medical History:  Diagnosis Date   Atypical chest pain 07/19/2021   Bigeminy    no current med.   Breast cancer ([place]) 06/2017   right   Bruises easily    CAD in native artery 10/31/2022   Carotid bruit 11/13/2019   Dental crowns present    History of diverticulitis    Hypertension    states under control with med., has been on med. x 5 yr.   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   Past Surgical History:  Procedure Laterality Date   ABDOMINAL HYSTERECTOMY     partial   BREAST LUMPECTOMY Right 06/22/2017   Malignant   BREAST LUMPECTOMY WITH RADIOACTIVE SEED AND SENTINEL LYMPH NODE BIOPSY Right 06/22/2017   Procedure: RIGHT BREAST LUMPECTOMY WITH RADIOACTIVE SEED AND RIGHT AXILLARY DEEP SENTINEL LYMPH NODE BIOPSY WITH BLUE DYE INJECTION;  Surgeon: Fanny Skates, MD;  Location: [facility];  Service: General;  Laterality: Right;   BREAST LUMPECTOMY WITH RADIOACTIVE SEED LOCALIZATION  Left 06/22/2017   Procedure: LEFT BREAST LUMPECTOMY WITH RADIOACTIVE SEED LOCALIZATION;  Surgeon: Fanny Skates, MD;  Location: [facility];  Service: General;  Laterality: Left;   CATARACT EXTRACTION W/ INTRAOCULAR LENS  IMPLANT, BILATERAL Bilateral    EXCISION OF BREAST BIOPSY Left 06/22/2017   benign   LOOP RECORDER INSERTION N/A 05/16/2022   Procedure: LOOP RECORDER INSERTION;  Surgeon: Vickie Epley, MD;  Location: [facility];  Service: Cardiovascular;  Laterality: N/A;   PERCUTANEOUS PINNING Right 06/22/2022   Procedure: PERCUTANEOUS PINNING OF RIGHT HIP;  Surgeon: Hiram Gash, MD;  Location: [facility];  Service: Orthopedics;  Laterality: Right;   TONSILLECTOMY     age 73   Patient Active Problem List   Diagnosis Date Noted   CAD in native artery 10/31/2022   Malnutrition of moderate degree 06/22/2022   Closed fracture of femur, neck ([place]) 06/21/2022   Urinary frequency 06/21/2022   Hypokalemia 06/21/2022   Heart murmur 06/21/2022   Hypothyroidism 06/21/2022   Dysphagia 05/17/2022   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   CVA (cerebral vascular accident) ([place]) 05/12/2022   Osteoporosis 01/24/2022   Atypical chest pain 07/19/2021   Temporomandibular joint (TMJ) pain 07/16/2020   Referred otalgia of both ears 07/16/2020   PAC (premature atrial  contraction) 05/11/2020   PVC (premature ventricular contraction) 05/11/2020   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   Carotid bruit 11/13/2019   Pure hypercholesterolemia 11/13/2019   Malignant neoplasm of upper-outer quadrant of right breast in female, estrogen receptor positive ([place]) 05/22/2017   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   Diplopia 09/17/2014   Dizziness and giddiness 09/17/2014   Essential hypertension 07/22/2014   Chest pain 07/22/2014   Family history of heart disease 07/22/2014    REFERRING DIAG:  R29.6 (ICD-10-CM) - Repeated falls, closed fracture of Rt hip    THERAPY DIAG:  Muscle  weakness (generalized)  Other abnormalities of gait and mobility  Rationale for Evaluation and Treatment Rehabilitation  PERTINENT HISTORY: hypertension, breast cancer, hyperlipidemia, Lt hip fracture secondary to fall 06/2022, CVA 06/2022   PRECAUTIONS:  Fall, Rt breast cancer with node removal, osteoporosis    SUBJECTIVE:                                                                                                                                                                                      SUBJECTIVE STATEMENT:  I bought a cane.  I didn't bring it today since it is raining and I don't feel as steady.    PAIN:  Are you having pain? Not right now but my right hand and arm can be very sore at times.    OBJECTIVE: (objective measures completed at initial evaluation unless otherwise dated)  DIAGNOSTIC FINDINGS: Rt hip fracture and ORIF     COGNITION: Overall cognitive status: Within functional limits for tasks assessed                         SENSATION: WFL     POSTURE: rounded shoulders, forward head, flexed trunk , and weight shift left   PALPATION: NA   LOWER EXTREMITY ROM: Rt hip limited by 50%, hamstrings limited by 50% bil.    LOWER EXTREMITY MMT:   MMT Right eval Right  09/27/22 Left eval Right 10/30/22 Left 10/30/22  Hip flexion 3+ 4- 4- 4- 4-  Hip extension         Hip abduction 3-  4    Hip adduction         Hip internal rotation         Hip external rotation         Knee flexion 3- 4- 3+    Knee extension 3+ 4 4- 4 painful hip 4-  Ankle dorsiflexion 3+ 4- 4- 4- 4  Ankle plantarflexion         Ankle inversion         Ankle eversion          (  Blank rows = not tested)   FUNCTIONAL TESTS:  5 times sit to stand: not able to due per pt request secondary to Lt shoulder pain with sit to stand.  Max LT UE pain with sit to stand   Timed up and go (TUG): 1 min, 27 seconds   09/27/22: 5x sit to stand: 29 seconds with use of Lt hand  TUG: 27  seconds   10/30/22: 5x sit to stand: 23 sec use of LT hand     TUG: 26 sec with standard cane   11/20/22: 5x sit to stand: 18.68 seconds    TUG: 19.45 without device    3 minute walk test: 225 feet with a standard cane GAIT: Distance walked: 25 feet  Assistive device utilized: Hemi walker on Lt Level of assistance: SBA Comments: slow mobility, reduced trunk rotation, reduce Rt LE stance time and reduced Rt arm swing     TODAY'S TREATMENT:    11/28/22: Nustep L3 x 6 min with PT present to monitor and discuss progress  Sit to stand: x10 without UE support LAQ 2# Rt 2x10, 3# Lt 2x10 with 5" hold Tandem stance: Rt and Lt- next to barre 3x10 seconds each Standing marching 2x10 with 2.5# weight added-standing on balance pad Stepping over hurdles in // bars: forward and lateral leading with Rt and Lt with min UE support as needed Seated ball squeezes x20 6" step ups on Lt: forward and lateral 2x5 each with UE support on // bars  Gait with hemi walker: to CR gym and back   11/20/22: Objective meaures taken for reassessment Nustep L3 x6 min with PT present to monitor and discuss progress  Sit to stand: x10 without UE support LAQ 2.5# Lt 2x10, Rt 2x10 with 5" hold Tandem stance: Rt and Lt- next to barre 3x10 seconds each Standing marching 2x10 with 2.5# weight added-standing on balance pad Stepping over hurdles at barre: forward and lateral leading with Rt and Lt with min UE support as needed Seated ball squeezes x20 Alternating step taps on 6" step with single hand support by PT x20   11/13/22: Nustep L3 x6 min with PT present to monitor and discuss progress  Sit to stand: x10 without UE support LAQ 2.5# Lt 2x10, Rt 2x10 with 5" hold Tandem stance: Rt and Lt- next to barre 3x10 seconds each Standing marching 2x10 with 2.5# weight added-standing on balance pad Stepping over hurdles at barre: forward and lateral leading with Rt and Lt with min UE support as needed Seated ball squeezes  x20 Alternating step taps on 6"  step with single hand support by PT x20    PATIENT EDUCATION:  Education details: Access Code: [email or web address] Person educated: Patient Education method: Explanation, Demonstration, and Handouts Education comprehension: verbalized understanding and returned demonstration   HOME EXERCISE PROGRAM: Access Code: [email or web address] URL: [email or web address] Date: 09/27/2022 Prepared by: Claiborne Billings  Exercises - Seated Long Arc Quad  - 3 x daily - 7 x weekly - 2 sets - 10 reps - 5 hold - Seated March   - 3 x daily - 7 x weekly - 3 sets - 10 reps - Seated Heel Toe Raises   - 3 x daily - 7 x weekly - 2 sets - 10 reps - Seated Isometric Hip Adduction with Ball  - 3 x daily - 7 x weekly - 2 sets - 10 reps - Sit to Stand with Armchair  - 2 x daily - 7 x weekly - 2 sets -  5-10 reps - Standing Hip Abduction with Counter Support  - 1 x daily - 7 x weekly - 2 sets - 10 reps - Heel Raises with Counter Support  - 2 x daily - 7 x weekly - 2 sets - 10 reps  Patient Education - Walking with a Single General Zylka - Modified 4 Point Gait Pattern  ASSESSMENT:   CLINICAL IMPRESSION:  Pt bought a cane yesterday and will work on using this at home.  She reports that she doesn't feel as steady today but did well with all standing tasks.  Pt did well with balance exercises in the clinic and required stand by assistance for safety and cueing by PT. Patient will benefit from skilled PT to address the below impairments and improve overall function.   OBJECTIVE IMPAIRMENTS: Abnormal gait, decreased activity tolerance, decreased balance, decreased mobility, difficulty walking, decreased strength, decreased safety awareness, impaired perceived functional ability, impaired flexibility, postural dysfunction, and pain.    ACTIVITY LIMITATIONS: carrying, lifting, sitting, standing, stairs, transfers, hygiene/grooming, and locomotion level   PARTICIPATION LIMITATIONS: meal prep, cleaning,  laundry, driving, shopping, and community activity   PERSONAL FACTORS: Age, Past/current experiences, and 1-2 comorbidities: CVA, falls, Rt hip fracture with ORIF  are also affecting patient's functional outcome.    REHAB POTENTIAL: Good   CLINICAL DECISION MAKING: Evolving/moderate complexity   EVALUATION COMPLEXITY: Moderate     GOALS: Goals reviewed with patient? Yes   SHORT TERM GOALS: Target date: 09/06/2022   Be independent in initial HEP Baseline: Goal status: Goal met 08/14/22   2.  Improve LE strength to perform sit to stand with moderate Lt UE support Baseline:  Goal status: Goal met 08/28/22  3.  Perform TUG in < or = to 60 seconds to reduce falls risk Baseline: 27 seconds (09/27/22) Goal status: MET      LONG TERM GOALS: Target date:  01/19/23   Be independent in advanced HEP Baseline: independent in current HEP and further progress is needed  Goal status: in progress    2.  Perform TUG in < or = to 15 seconds to reduce falls risk Baseline: 19.45 seconds (11/20/22) Goal status: In progress    3.  ambulate > or = to 350 feet in 3 minutes to improve community ambulation and independence  Baseline: 225 (11/20/22) Goal status: NEW    4.  Ambulate with hemi walker with supervision or no guard due to improved gait Baseline: able to do this Goal status: MET   5.  perform 5x sit to stand in < or = to 14 seconds to reduce falls risk  Baseline: 18.68 seconds  (11/20/22)  Goal Status: NEW  6. Ambulate with cane for all distances and demonstrate independence due to improved stability  Baseline: using hemiwalker and will get cane soon.  Walked well with standard cane in the clinic  (11/20/22)  Goal status: INITIAL     PLAN:   PT FREQUENCY: 1-2x/week   PT DURATION: 8 weeks   PLANNED INTERVENTIONS: Therapeutic exercises, Therapeutic activity, Neuromuscular re-education, Balance training, Gait training, Patient/Family education, Self Care, Joint mobilization,  Stair training, Dry Needling, Electrical stimulation, Cryotherapy, Moist heat, Taping, Manual therapy, and Re-evaluation   PLAN FOR NEXT SESSION: Continue to work on gait , strength and balance  Sigurd Sos, PT 11/28/22 1:18 PM

## 2022-11-30 ENCOUNTER — Ambulatory Visit: Payer: Medicare PPO | Admitting: Occupational Therapy

## 2022-12-06 ENCOUNTER — Ambulatory Visit: Payer: Medicare PPO | Attending: Family Medicine | Admitting: Occupational Therapy

## 2022-12-06 DIAGNOSIS — R278 Other lack of coordination: Secondary | ICD-10-CM | POA: Insufficient documentation

## 2022-12-06 DIAGNOSIS — I69354 Hemiplegia and hemiparesis following cerebral infarction affecting left non-dominant side: Secondary | ICD-10-CM | POA: Diagnosis not present

## 2022-12-06 DIAGNOSIS — M79601 Pain in right arm: Secondary | ICD-10-CM | POA: Insufficient documentation

## 2022-12-06 DIAGNOSIS — M6281 Muscle weakness (generalized): Secondary | ICD-10-CM | POA: Diagnosis not present

## 2022-12-06 NOTE — Therapy (Signed)
OUTPATIENT OCCUPATIONAL THERAPY  Treatment Session  Patient Name: Angela Buchanan MRN: [id number] DOB:1951/09/05, 72 y.o., female 14 Date: 12/06/2022  PCP: Glenis Smoker, MD REFERRING PROVIDER: Glenis Smoker, MD       END OF SESSION:  OT End of Session - 12/06/22 1407     Visit Number 18    Number of Visits 29    Date for OT Re-Evaluation 12/29/22    Authorization Type [employer]    OT Start Time 65    OT Stop Time 1400    OT Time Calculation (min) 45 min                            Past Medical History:  Diagnosis Date   Atypical chest pain 07/19/2021   Bigeminy    no current med.   Breast cancer ([place]) 06/2017   right   Bruises easily    CAD in native artery 10/31/2022   Carotid bruit 11/13/2019   Dental crowns present    History of diverticulitis    Hypertension    states under control with med., has been on med. x 5 yr.   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   Past Surgical History:  Procedure Laterality Date   ABDOMINAL HYSTERECTOMY     partial   BREAST LUMPECTOMY Right 06/22/2017   Malignant   BREAST LUMPECTOMY WITH RADIOACTIVE SEED AND SENTINEL LYMPH NODE BIOPSY Right 06/22/2017   Procedure: RIGHT BREAST LUMPECTOMY WITH RADIOACTIVE SEED AND RIGHT AXILLARY DEEP SENTINEL LYMPH NODE BIOPSY WITH BLUE DYE INJECTION;  Surgeon: Fanny Skates, MD;  Location: [facility];  Service: General;  Laterality: Right;   BREAST LUMPECTOMY WITH RADIOACTIVE SEED LOCALIZATION Left 06/22/2017   Procedure: LEFT BREAST LUMPECTOMY WITH RADIOACTIVE SEED LOCALIZATION;  Surgeon: Fanny Skates, MD;  Location: [facility];  Service: General;  Laterality: Left;    CATARACT EXTRACTION W/ INTRAOCULAR LENS  IMPLANT, BILATERAL Bilateral    EXCISION OF BREAST BIOPSY Left 06/22/2017   benign   LOOP RECORDER INSERTION N/A 05/16/2022   Procedure: LOOP RECORDER INSERTION;  Surgeon: Vickie Epley, MD;  Location: [facility];  Service: Cardiovascular;  Laterality: N/A;   PERCUTANEOUS PINNING Right 06/22/2022   Procedure: PERCUTANEOUS PINNING OF RIGHT HIP;  Surgeon: Hiram Gash, MD;  Location: [facility];  Service: Orthopedics;  Laterality: Right;   TONSILLECTOMY     age 29   Patient Active Problem List   Diagnosis Date Noted   CAD in native artery 10/31/2022   Malnutrition of moderate degree 06/22/2022   Closed fracture of femur, neck ([place]) 06/21/2022   Urinary frequency 06/21/2022   Hypokalemia 06/21/2022   Heart murmur 06/21/2022   Hypothyroidism 06/21/2022   Dysphagia 05/17/2022   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   CVA (cerebral vascular accident) ([place]) 05/12/2022   Osteoporosis 01/24/2022   Atypical chest pain 07/19/2021   Temporomandibular joint (TMJ) pain 07/16/2020   Referred otalgia of both ears 07/16/2020   PAC (premature atrial contraction) 05/11/2020   PVC (premature ventricular contraction) 05/11/2020   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   Carotid bruit 11/13/2019   Pure hypercholesterolemia 11/13/2019   Malignant neoplasm of upper-outer quadrant of right breast in female,  estrogen receptor positive ([place]) 05/22/2017   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 Pt accompanied by: self and significant other (Husband, [name])  PERTINENT HISTORY: hypertension, breast cancer, hyperlipidemia, Lt hip fracture secondary to fall 06/2022, CVA 06/2022   PRECAUTIONS: Fall, Rt breast cancer with node removal, osteoporosis  WEIGHT BEARING RESTRICTIONS: No  PAIN:  Are you having pain? No  FALLS: Has patient fallen in last 6 months? Yes. Number of falls 1 fall, one week after d/c home from SNF  LIVING ENVIRONMENT: Lives with: lives with their spouse Lives in: House/apartment Stairs: Yes: External: 2 steps; on left going up Has following equipment at home: Gilford Rile - 2 wheeled, Hemi walker, Wheelchair (manual), shower chair, and Grab bars  PLOF: Independent and Independent with basic ADLs  PATIENT GOALS: to get use of my R arm again  OBJECTIVE:   HAND DOMINANCE: Right  ADLs: Transfers/ambulation related to ADLs: Mod I with hemi-walker Eating: using L hand for eating, husband is assisting with cutting up food Grooming: Mod I, using L hand UB Dressing: Mod assist LB Dressing: Max assist Toileting: Mod I Bathing: Supervision - Mod I Tub Shower transfers: Dentist: Shower seat with back and Grab bars  UPPER EXTREMITY ROM:    Active ROM Right eval Left eval Right 10/18/22  Right 11/15/22  Shoulder flexion 54   82  Shoulder abduction      Shoulder adduction      Shoulder extension      Shoulder internal rotation      Shoulder external rotation      Elbow flexion 108   132  Elbow extension -24   -12  Wrist flexion    38  Wrist extension    18  Wrist ulnar deviation      Wrist radial deviation      Wrist pronation      Wrist supination      (Blank rows = not tested)  Passive ROM Right eval Left eval Right 10/18/22 Right 11/15/22  Shoulder flexion 65  76 86  Shoulder abduction       Shoulder adduction      Shoulder extension      Shoulder internal rotation      Shoulder external rotation      Elbow flexion      Elbow extension -17  -15 -10  Wrist flexion 46  56 60  Wrist extension 27  50 40  Wrist ulnar deviation      Wrist radial deviation      Wrist pronation      Wrist supination      (Blank rows = not tested)  Passive ROM Right 10/18/22 Right 11/15/22  Thumb MCP (0-60)    Thumb IP (0-80)    Thumb Radial abd/add (0-55)    Thumb Palmar abd/add (0-45)    Thumb Opposition to Small Finger    Index MCP (0-90)  0  Index PIP (0-100)  -5  Index DIP (0-70)   -3  Long MCP (0-90)     Long PIP (0-100) -30  -30  Long DIP (0-70) -20 -20  Ring MCP (0-90)     Ring PIP (0-100) -25  -21  Ring DIP (0-70) -15  -8  Little MCP (0-90)     Little PIP (0-100)   -18  Little DIP (0-70)   -4  (Blank rows = not tested)   UPPER EXTREMITY MMT:   not assessed due to pain with any PROM, AROM    HAND FUNCTION: Loose gross grasp   COORDINATION: Box and Blocks:  Right 2blocks, Left 45 blocks. Modified to complete with RUE.  Pt able to grasp and move across to other side of box with barrier removed.   11/01/22: RUE 13 blocks  SENSATION: Mild decrease in R  EDEMA: mild edema in wrist hand, and fingers on RUE  OBSERVATIONS: Pt guarded with movement, hypersensitive to touch and PROM.     TODAY'S TREATMENT:                                   12/06/22 Supine exercises: engaged in snow angel with focus on increased shoulder ROM in combination with keeping forearm and wrist in contact with mat table.  Pt demonstrating ability to keep finger tips in contact with table top with cues, however unable to keep forearm in contact with increased shoulder abduction. Transitioned to more active ROM with shoulder abduction, elbow flexion/extension to tap chin, hooklying single arm chest press with OT providing intermittent cues and tactile support at elbow to further facilitate extension in  abduction and then when completing chest press. Wrist: OT demonstrated supination/pronation and wrist flexion/extension with use of table top to decrease compensatory arm movements during supination/pronation and wrist flexion/extension.  Pt demonstrating improvement in wrist flexion and extension with forearm supported on table top.  Pt demonstrating full pronation, however continuing to demonstrate decreased supination achieving 20* from neutral. Coordination/functional reach: engaged in picking up large jack pieces with R hand and incorporating shoulder flexion to achieve increased functional reach to place jacks into container at midline.  Transitioned to picking up checker pieces.  Pt able to pick up and move checker pieces with increased effort, therefore transitioned to attempt to stack pieces.  Pt utilizing thumb and long finger when picking up and then able to stack and unstack checkers with good grasp and release.   11/28/22 UE ROM: facilitated increased shoulder ROM, elbow extension, and grip with picking up bean bags and placing to matching targets.  Pt demonstrating gross grasp on bean bag, but improved shoulder flexion and elbow extension to reach towards each matching colored spot incorporating crossing midline and reaching outside BOS.  OT increased challenge to incorporating supination when releasing bean bags into basket.  Pt initially demonstrating 60% of supination and increased difficulty with releasing bean bags while in supination.  OT modifying task to facilitate supination and then return to neutral prior to releasing to allow for increased release of grasp.  Pt recruiting mild compensatory trunk movement to increase supination.   Towel scrunches: OT instructing pt to utilize finger flexion and extension to scrunch paper towel into fist.  Pt demonstrating difficulty with extension, especially digit 3 and 4, when opening/adjusting to continue to gather towel into hand.  OT providing  cues for hand placement for improved technique, still with difficulty  with finger extension.   11/21/22 Splint:  OT modified fit of splint to facilitate increased extension of fingers at DIP.  Modified placement of velcro strapping to thumb to facilitate improved positioning of thumb and whole hand.   PROM: OT providing blocked facilitation for extension to long and ring finger at DIP and then PIP with pt reporting tightness and min pain with stretching.   Grasp: gross grasp and precision pinch with picking up large jacks with R hand. Pt initially utilizing gross grasp, however able to transition to precision pinch with focus on thumb opposition to index finger.  Pt demonstrating decreased extension post precision. Functional grasp: picking up, stacking, and bringing cups to mouth with focus on use of RUE as well as use of BUE together.  Pt able to achieve increased extension of fingers to grasp cup in R hand and demonstrate motor control to bring to mouth, however unable to release grasp on cup due to difficulty with further extension to release cup.  Discussed use of BUE when opening containers, drinking from cup with straw, and increasing use of RUE with self-feeding.    PATIENT EDUCATION: Education details: ongoing condition specific education and progressive exercises. Person educated: Patient and Spouse Education method: Explanation, Demonstration, Tactile cues, Verbal cues, and Handouts Education comprehension: verbalized understanding and needs further education  HOME EXERCISE PROGRAM: Access Code: [email or web address] URL: [email or web address] Date: 12/06/2022 Prepared by: [facility]  Exercises - Supine Shoulder Flexion AAROM with Dowel  - 1 x daily - 3 x weekly - 2 sets - 10 reps - Supine Shoulder Protraction with Dowel  - 1 x daily - 3 x weekly - 2 sets - 10 reps - Supine Shoulder Abduction AAROM with Dowel  - 1 x daily - 3 x weekly - 2  sets - 10 reps - Touch finger to nose  - 1 x daily - 3 x weekly - 2 sets - 10 reps - 5-10 sec hold - Touch finger to nose  - 1 x daily - 3 x weekly - 2 sets - 10 reps - Hooklying Single Arm Chest Press  - 1 x daily - 3 x weekly - 2 sets - 10 reps - Thumb AROM Opposition  - 1 x daily - 7 x weekly - 1 sets - 5 reps - Seated Finger Composite Flexion Stretch  - 1 x daily - 7 x weekly - 1 sets - 5 reps - Hand PROM Finger Extension  - 1 x daily - 7 x weekly - 1 sets - 10 reps - Seated Forearm Pronation Supination AROM  - 1 x daily - 7 x weekly - 1 sets - 10 reps - Seated Wrist Flexion Extension PROM  - 1 x daily - 7 x weekly - 1 sets - 10 reps   GOALS: Goals reviewed with patient? Yes  SHORT TERM GOALS: Target date: 12/08/22  Pt and spouse will be independent with updated ROM and coordination HEP. Baseline: Goal status: MET - 12/06/22  2.  Pt will verbalize understanding of task modifications and/or potential AE needs to increase ease, safety, and independence w/ ADLs. Baseline:  Goal status: MET - 12/06/22     LONG TERM GOALS: Target date: 12/29/22  1.  Pt will demonstrate ability to demonstrate improved functional grasp in RUE to open new jar, increased handwriting, and self-feeding. Baseline:  Goal status: IN PROGRESS  2.  Pt will demonstrate improved UE functional use for ADLs as evidenced  by increasing box/ blocks score by 5 blocks with RUE, to be completed in standardized manner. Baseline:  13 blocks with RUE over barrier on 11/01/22 Goal status: IN PROGRESS  3.  Pt will demonstrate improved shoulder ROM to allow for reach to moderate height as needed for ADLs/IADLs (washing back, putting on jacket, etc).  Baseline:  Goal status: IN PROGRESS  4. Pt will demonstrate improved extension in long and ring finger to allow for more functional hand opening.  Baseline:  Goal status: IN PROGRESS  5.  Pt will report decreased pain in dominant RUE interfering with ADLs/IADLs by decreased  impairment score on Quick DASH. Baseline: 77% Goal status: IN PROGRESS  6.  Pt will be independent with progressive splint wear and care PRN Baseline: modifications to splint TBD Goal status: IN PROGRESS  ASSESSMENT:  CLINICAL IMPRESSION: Pt demonstrating improved functional grasp and manipulation with ability to pick up and stack checker pieces.  Pt utilizing long finger and thumb, but still with improvements in motor control and success.  Pt demonstrating improved motor control with shoulder abduction and elbow flexion to reach towards chin.  Pt continues to demonstrate decreased supination as needed to turn on water faucet. Pt continues to demonstrate flexion contractures in long and ring finger, with difficulty with extension during blocked exercises and function. Pt continues to benefit from encouragement to increase functional use of RUE into self-care tasks and explanation of typical recovery process in regards to benefits of mutti-disciplinary approach with PM&R in addition to OT/PT.  PERFORMANCE DEFICITS: in functional skills including ADLs, IADLs, coordination, sensation, edema, tone, ROM, strength, pain, flexibility, Fine motor control, Gross motor control, balance, body mechanics, endurance, decreased knowledge of use of DME, skin integrity, vision, and UE functional use, cognitive skills including emotional and safety awareness, and psychosocial skills including coping strategies.   IMPAIRMENTS: are limiting patient from ADLs, IADLs, and rest and sleep.   CO-MORBIDITIES: may have co-morbidities  that affects occupational performance. Patient will benefit from skilled OT to address above impairments and improve overall function.  MODIFICATION OR ASSISTANCE TO COMPLETE EVALUATION: Min-Moderate modification of tasks or assist with assess necessary to complete an evaluation.  OT OCCUPATIONAL PROFILE AND HISTORY: Detailed assessment: Review of records and additional review of physical,  cognitive, psychosocial history related to current functional performance.  CLINICAL DECISION MAKING: Moderate - several treatment options, min-mod task modification necessary  REHAB POTENTIAL: Good  EVALUATION COMPLEXITY: Moderate    PLAN:  OT FREQUENCY: 2x/week  OT DURATION: 8 weeks  PLANNED INTERVENTIONS: self care/ADL training, therapeutic exercise, therapeutic activity, neuromuscular re-education, manual therapy, passive range of motion, balance training, functional mobility training, splinting, paraffin, fluidotherapy, biofeedback, compression bandaging, moist heat, cryotherapy, contrast bath, patient/family education, visual/perceptual remediation/compensation, psychosocial skills training, coping strategies training, and DME and/or AE instructions  RECOMMENDED OTHER SERVICES: NA  CONSULTED AND AGREED WITH PLAN OF CARE: Patient and family member/caregiver  PLAN FOR NEXT SESSION: Review splint fit and make modification as needed.  AAROM or self-ROM to include shoulder flexion/extension and internal/external rotation, elbow, forearm.  Continue to focus on PROM, massage, and stretching.  Functional grasp and release.  Attempt handwriting and/or self-feeding.   Simonne Come, OTR/L 12/06/2022, 2:08 PM

## 2022-12-07 ENCOUNTER — Encounter: Payer: Self-pay | Admitting: Student

## 2022-12-07 ENCOUNTER — Other Ambulatory Visit: Payer: Self-pay

## 2022-12-07 ENCOUNTER — Ambulatory Visit: Payer: Medicare PPO | Attending: Cardiology | Admitting: Student

## 2022-12-07 DIAGNOSIS — E7800 Pure hypercholesterolemia, unspecified: Secondary | ICD-10-CM | POA: Diagnosis not present

## 2022-12-07 DIAGNOSIS — E876 Hypokalemia: Secondary | ICD-10-CM | POA: Diagnosis not present

## 2022-12-07 MED ORDER — EZETIMIBE 10 MG PO TABS: 10.0000 mg | ORAL_TABLET | Freq: Every day | ORAL | 3 refills | Status: DC

## 2022-12-07 NOTE — Patient Instructions (Signed)
Your Results:             Your most recent labs Goal  Total Cholesterol 184 < 200  Triglycerides 124 < 150  HDL (happy/good cholesterol) 82 > 40  LDL (lousy/bad cholesterol 81 < 70   Medication changes:  Continue taking Pravastatin 40 mg daily and start taking ezetimibe 10 mg daily   Lab orders: We want to repeat labs after 3 months.  We will send you a lab order to remind you once we get closer to that time.

## 2022-12-07 NOTE — Assessment & Plan Note (Signed)
Assessment:  LDL goal: < 70 mg/dl last LDLc 81 mg/dl (10/31/2022) Tolerates moderate intensity statins well without any side effects  Discussed next potential options (ezetimibe PCSK-9 inhibitors, bempedoic acid and inclisiran); cost, dosing efficacy, side effects   Plan: Continue taking current medications pravastatin 40 mg  Start taking ezetimibe 10 mg daily  Will repeat FLP in 3 months

## 2022-12-07 NOTE — Progress Notes (Signed)
Patient ID: Angela Buchanan                 DOB: 03-14-1951                    MRN: [id number]      HPI: Angela Buchanan is a 72 y.o. female patient referred to lipid clinic by Hoy Morn. PMH is significant for CVA(05/2022), HTN, CAD  Today patient presented with her husband. She had stroke on 06/12/22 her right side of body was affected she is improving a lot. Go to PT/OT 4 days a week. She tolerates pravastatin without any trouble. Eats healthy meals. Stroke has changed her life her 2 siblings died from stroke and her both parent had stroke too. It runs in the family. She use to take her dog to walk 3 times per day before she has stroke.  Diet: low salt, no dep fried food.  Lots of vegetables, chicken and fish   Exercise: occupational and physical therapy 4 days week   Family History: mother: stroke, sister and brother- died from stroke  Both side grand parents: hx of stroke   Social History:  Alcohol: none  Smoking: never  Labs: Lipid Panel     Component Value Date/Time   CHOL 184 10/31/2022 1523   TRIG 124 10/31/2022 1523   HDL 82 10/31/2022 1523   CHOLHDL 2.2 10/31/2022 1523   CHOLHDL 3.1 05/13/2022 0137   VLDL 11 05/13/2022 0137   LDLCALC 81 10/31/2022 1523   LABVLDL 21 10/31/2022 1523    Past Medical History:  Diagnosis Date   Atypical chest pain 07/19/2021   Bigeminy    no current med.   Breast cancer ([place]) 06/2017   right   Bruises easily    CAD in native artery 10/31/2022   Carotid bruit 11/13/2019   Dental crowns present    History of diverticulitis    Hypertension    states under control with med., has been on med. x 5 yr.   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 90 tablet 3   aspirin EC 81 MG tablet Take 1 tablet (81 mg total) by mouth daily. Swallow whole. 30 tablet 12   calcium-vitamin D (OSCAL WITH D) 500-5 MG-MCG tablet Take 1 tablet by mouth daily with breakfast. 30 tablet 0   cetirizine (ZYRTEC) 10 MG tablet Take 10 mg by mouth daily.     Cholecalciferol (VITAMIN D) 50 MCG (2000 UT) tablet Take 2,000 Units by mouth daily.     clobetasol ointment (TEMOVATE) 0.05 % Apply topically 2 (two) times daily as needed (itching). (Patient taking differently: Apply 1 Application topically 2 (two) times daily as needed (itching).) 30 g 0   clopidogrel (PLAVIX) 75 MG tablet Take 1 tablet (75 mg total) by mouth daily. 18 tablet 0   docusate sodium (COLACE) 100 MG capsule Take 1 capsule (100 mg total) by mouth 2 (two) times daily. 10 capsule 0   feeding supplement (ENSURE ENLIVE / ENSURE PLUS) LIQD Take 237 mLs by mouth 2 (two) times daily between  meals. 237 mL 12   ferrous sulfate 325 (65 FE) MG tablet Take 1 tablet (325 mg total) by mouth 2 (two) times daily with a meal. 90 tablet 3   fluticasone (FLONASE) 50 MCG/ACT nasal spray Place 1 spray into both nostrils daily as needed for allergies or rhinitis.     HYDROcodone-acetaminophen (NORCO) 5-325 MG tablet Take 1 tablet by mouth every 6 (six) hours as needed for severe pain. 30 tablet 0   hydrOXYzine (ATARAX) 25 MG tablet Take 25 mg by mouth 3 (three) times daily as needed for nausea or vomiting.     levothyroxine (SYNTHROID) 25 MCG tablet Take 1 tablet (25 mcg total) by mouth daily at 6 (six) AM. 30 tablet 0   lisinopril (ZESTRIL) 40 MG tablet Take 40 mg by mouth daily.     meclizine (ANTIVERT) 25 MG tablet Take 25 mg by mouth daily as needed for dizziness.     methocarbamol (ROBAXIN) 500 MG tablet Take 1 tablet (500 mg total) by mouth every 6 (six) hours as needed for muscle  spasms. 30 tablet 0   metoprolol tartrate (LOPRESSOR) 25 MG tablet Take 25 mg by mouth every evening.     Multiple Vitamins-Minerals (CENTRUM SILVER 50+WOMEN PO) Take 1 tablet by mouth daily.     nystatin (MYCOSTATIN) 100000 UNIT/ML suspension Take 5 mLs (500,000 Units total) by mouth 4 (four) times daily. 60 mL 1   oxyCODONE-acetaminophen (PERCOCET) 5-325 MG tablet Take 1-2 tablets by mouth every 6 (six) hours as needed. 20 tablet 0   pantoprazole (PROTONIX) 40 MG tablet Take 1 tablet (40 mg total) by mouth daily at 12 noon. 30 tablet 0   polyethylene glycol (MIRALAX / GLYCOLAX) 17 g packet Take 17 g by mouth daily as needed for mild constipation. 14 each 0   potassium chloride SA (KLOR-CON M20) 20 MEQ tablet Take 2 tablets (40 mEq total) by mouth daily. 60 tablet 6   pravastatin (PRAVACHOL) 40 MG tablet Take 1 tablet (40 mg total) by mouth daily. 30 tablet 0   senna (SENOKOT) 8.6 MG TABS tablet Take 2 tablets (17.2 mg total) by mouth daily as needed for mild constipation. 120 tablet 0   No current facility-administered medications on file prior to visit.    Allergies  Allergen Reactions   Atenolol Other (See Comments)    CHEST PAIN   Bisoprolol Other (See Comments)    "FEELS WEIRD"   Prednisone Other (See Comments)    UNABLE TO FOCUS   Amoxicillin     unknown   Amoxicillin-Pot Clavulanate Diarrhea and Other (See Comments)   Hctz [Hydrochlorothiazide]     Hyponatremia    Rosuvastatin     myalgias   Latex Rash    Problem  Pure Hypercholesterolemia   Current Medications: Pravastatin 40 mg daily  Intolerances: rosuvastatin 10 mg and 20 mg  Risk Factors: CVA(05/12/2022), HTN, CAD LDL goal: <70 mg/dl Last lipid lab on 10/31/2022: TC-184,TG-124,HDL-82,LDLc-81    Pure hypercholesterolemia Assessment:  LDL goal: < 70 mg/dl last LDLc 81 mg/dl (10/31/2022) Tolerates moderate intensity statins well without any side effects  Discussed next potential options (ezetimibe PCSK-9  inhibitors, bempedoic acid and inclisiran); cost, dosing efficacy, side effects   Plan: Continue taking current medications pravastatin 40 mg  Start taking ezetimibe 10 mg daily  Will repeat FLP in 3 months     Thank you,  Cammy Copa, Pharm.D [facility] of [facility] [place], [place]  [id number]  Phone: [phone]; Fax: [phone]

## 2022-12-08 ENCOUNTER — Telehealth (HOSPITAL_BASED_OUTPATIENT_CLINIC_OR_DEPARTMENT_OTHER): Payer: Self-pay

## 2022-12-08 DIAGNOSIS — E876 Hypokalemia: Secondary | ICD-10-CM

## 2022-12-08 LAB — BASIC METABOLIC PANEL
BUN/Creatinine Ratio: 13 (ref 12–28)
BUN: 10 mg/dL (ref 8–27)
CO2: 20 mmol/L (ref 20–29)
Calcium: 9.7 mg/dL (ref 8.7–10.3)
Chloride: 101 mmol/L (ref 96–106)
Creatinine, Ser: 0.77 mg/dL (ref 0.57–1.00)
Glucose: 94 mg/dL (ref 70–99)
Potassium: 4.8 mmol/L (ref 3.5–5.2)
Sodium: 137 mmol/L (ref 134–144)
eGFR: 82 mL/min/{1.73_m2} (ref >59)

## 2022-12-08 MED ORDER — POTASSIUM CHLORIDE CRYS ER 20 MEQ PO TBCR: EXTENDED_RELEASE_TABLET | ORAL | 6 refills | Status: DC

## 2022-12-08 NOTE — Addendum Note (Signed)
Addended by: Gerald Stabs on: 12/08/2022 09:41 AM   Modules accepted: Orders

## 2022-12-08 NOTE — Telephone Encounter (Addendum)
Left message for patient to call back     ----- Message from Loel Dubonnet, NP sent at 12/08/2022  9:00 AM EST ----- Normal kidney function and electrolytes. Potassium remains normal. May reduce to potassium 49mq and 451m every other day with repeat BMP in 2 weeks for monitoring.

## 2022-12-08 NOTE — Telephone Encounter (Signed)
Patient returned call, results reviewed, patient verbalized understanding! Labs mailed to patient. Script updated with pharm.        ----- Message from Loel Dubonnet, NP sent at 12/08/2022  9:00 AM EST ----- Normal kidney function and electrolytes. Potassium remains normal. May reduce to potassium 32mq and 416m every other day with repeat BMP in 2 weeks for monitoring.

## 2022-12-13 ENCOUNTER — Ambulatory Visit: Payer: Medicare PPO | Admitting: Occupational Therapy

## 2022-12-13 ENCOUNTER — Ambulatory Visit: Payer: Medicare PPO | Attending: Family Medicine

## 2022-12-13 DIAGNOSIS — R2689 Other abnormalities of gait and mobility: Secondary | ICD-10-CM | POA: Insufficient documentation

## 2022-12-13 DIAGNOSIS — M79601 Pain in right arm: Secondary | ICD-10-CM | POA: Diagnosis not present

## 2022-12-13 DIAGNOSIS — M6281 Muscle weakness (generalized): Secondary | ICD-10-CM | POA: Insufficient documentation

## 2022-12-13 DIAGNOSIS — R293 Abnormal posture: Secondary | ICD-10-CM | POA: Diagnosis not present

## 2022-12-13 DIAGNOSIS — I69354 Hemiplegia and hemiparesis following cerebral infarction affecting left non-dominant side: Secondary | ICD-10-CM | POA: Diagnosis not present

## 2022-12-13 DIAGNOSIS — R278 Other lack of coordination: Secondary | ICD-10-CM

## 2022-12-13 NOTE — Therapy (Signed)
OUTPATIENT PHYSICAL THERAPY TREATMENT NOTE   Patient Name: Angela Buchanan MRN: [id number] DOB:January 18, 1951, 72 y.o., female Today's Date: 12/13/2022  PCP:  Sela Hilding, MD   REFERRING PROVIDER:  Sela Hilding, MD      END OF SESSION:   PT End of Session - 12/13/22 1614     Visit Number 15    Date for PT Re-Evaluation 01/19/23    Authorization Type 20 visitis 11/8-2/21/24    Authorization - Visit Number 15    Authorization - Number of Visits 36    Progress Note Due on Visit 20    PT Start Time [id number]    PT Stop Time 1613    PT Time Calculation (min) 39 min    Activity Tolerance Patient tolerated treatment well    Behavior During Therapy [facility] for tasks assessed/performed                        Past Medical History:  Diagnosis Date   Atypical chest pain 07/19/2021   Bigeminy    no current med.   Breast cancer ([place]) 06/2017   right   Bruises easily    CAD in native artery 10/31/2022   Carotid bruit 11/13/2019   Dental crowns present    History of diverticulitis    Hypertension    states under control with med., has been on med. x 5 yr.   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   Past Surgical History:  Procedure Laterality Date   ABDOMINAL HYSTERECTOMY     partial   BREAST LUMPECTOMY Right 06/22/2017   Malignant   BREAST LUMPECTOMY WITH RADIOACTIVE SEED AND SENTINEL LYMPH NODE BIOPSY Right 06/22/2017   Procedure: RIGHT BREAST LUMPECTOMY WITH RADIOACTIVE SEED AND RIGHT AXILLARY DEEP SENTINEL LYMPH NODE BIOPSY WITH BLUE DYE INJECTION;  Surgeon: Fanny Skates, MD;  Location: [place];  Service: General;  Laterality: Right;   BREAST LUMPECTOMY WITH RADIOACTIVE SEED LOCALIZATION  Left 06/22/2017   Procedure: LEFT BREAST LUMPECTOMY WITH RADIOACTIVE SEED LOCALIZATION;  Surgeon: Fanny Skates, MD;  Location: [place];  Service: General;  Laterality: Left;   CATARACT EXTRACTION W/ INTRAOCULAR LENS  IMPLANT, BILATERAL Bilateral    EXCISION OF BREAST BIOPSY Left 06/22/2017   benign   LOOP RECORDER INSERTION N/A 05/16/2022   Procedure: LOOP RECORDER INSERTION;  Surgeon: Vickie Epley, MD;  Location: [place] [facility];  Service: Cardiovascular;  Laterality: N/A;   PERCUTANEOUS PINNING Right 06/22/2022   Procedure: PERCUTANEOUS PINNING OF RIGHT HIP;  Surgeon: Hiram Gash, MD;  Location: [facility];  Service: Orthopedics;  Laterality: Right;   TONSILLECTOMY     age 31   Patient Active Problem List   Diagnosis Date Noted   CAD in native artery 10/31/2022   Malnutrition of moderate degree 06/22/2022   Closed fracture of femur, neck ([place]) 06/21/2022   Urinary frequency 06/21/2022   Hypokalemia 06/21/2022   Heart murmur 06/21/2022   Hypothyroidism 06/21/2022   Dysphagia 05/17/2022   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   CVA (cerebral vascular accident) ([place]) 05/12/2022   Osteoporosis 01/24/2022   Atypical chest pain 07/19/2021   Temporomandibular joint (TMJ) pain 07/16/2020   Referred otalgia of both ears 07/16/2020   PAC (premature  atrial contraction) 05/11/2020   PVC (premature ventricular contraction) 05/11/2020   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   Carotid bruit 11/13/2019   Pure hypercholesterolemia 11/13/2019   Malignant neoplasm of upper-outer quadrant of right breast in female, estrogen receptor positive ([place]) 05/22/2017   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   Diplopia 09/17/2014   Dizziness and giddiness 09/17/2014   Essential hypertension 07/22/2014   Chest pain 07/22/2014   Family history of heart disease 07/22/2014    REFERRING DIAG:  R29.6 (ICD-10-CM) - Repeated falls, closed fracture of Rt hip    THERAPY DIAG:  Other  abnormalities of gait and mobility  Muscle weakness (generalized)  Abnormal posture  Rationale for Evaluation and Treatment Rehabilitation  PERTINENT HISTORY: hypertension, breast cancer, hyperlipidemia, Lt hip fracture secondary to fall 06/2022, CVA 06/2022   PRECAUTIONS:  Fall, Rt breast cancer with node removal, osteoporosis    SUBJECTIVE:                                                                                                                                                                                      SUBJECTIVE STATEMENT:  I brought my cane with me to learn how to use it.  I am seeing a doctor about my Rt arm tomorrow.     PAIN:  Are you having pain? Not right now but my right hand and arm can be very sore at times.    OBJECTIVE: (objective measures completed at initial evaluation unless otherwise dated)  DIAGNOSTIC FINDINGS: Rt hip fracture and ORIF     COGNITION: Overall cognitive status: Within functional limits for tasks assessed                         SENSATION: WFL     POSTURE: rounded shoulders, forward head, flexed trunk , and weight shift left   PALPATION: NA   LOWER EXTREMITY ROM: Rt hip limited by 50%, hamstrings limited by 50% bil.    LOWER EXTREMITY MMT:   MMT Right eval Right  09/27/22 Left eval Right 10/30/22 Left 10/30/22  Hip flexion 3+ 4- 4- 4- 4-  Hip extension         Hip abduction 3-  4    Hip adduction         Hip internal rotation         Hip external rotation         Knee flexion 3- 4- 3+    Knee extension 3+ 4 4- 4 painful hip 4-  Ankle dorsiflexion 3+ 4- 4- 4- 4  Ankle plantarflexion         Ankle inversion  Ankle eversion          (Blank rows = not tested)   FUNCTIONAL TESTS:  5 times sit to stand: not able to due per pt request secondary to Lt shoulder pain with sit to stand.  Max LT UE pain with sit to stand   Timed up and go (TUG): 1 min, 27 seconds   09/27/22: 5x sit to stand: 29 seconds with  use of Lt hand  TUG: 27 seconds   10/30/22: 5x sit to stand: 23 sec use of LT hand     TUG: 26 sec with standard cane   11/20/22: 5x sit to stand: 18.68 seconds    TUG: 19.45 without device    3 minute walk test: 225 feet with a standard cane GAIT: Distance walked: 25 feet  Assistive device utilized: Hemi walker on Lt Level of assistance: SBA Comments: slow mobility, reduced trunk rotation, reduce Rt LE stance time and reduced Rt arm swing     TODAY'S TREATMENT:    12/13/22: Nustep L3 x 6 min with PT present to monitor and discuss progress - legs only  Sit to stand: x10 without UE support LAQ 2# Rt 2x10 Tandem stance: Rt and Lt- next to barre 3x10 seconds each Standing marching 2x10 with 2# weight added-standing on balance pad Seated ball squeezes x20 6" step ups on Lt x10, Rt x5 Gait with small base quad cane: verbal cues for use of cane, improve step length  11/28/22: Nustep L3 x 6 min with PT present to monitor and discuss progress  Sit to stand: x10 without UE support LAQ 2# Rt 2x10, 3# Lt 2x10 with 5" hold Tandem stance: Rt and Lt- next to barre 3x10 seconds each Standing marching 2x10 with 2.5# weight added-standing on balance pad Stepping over hurdles in // bars: forward and lateral leading with Rt and Lt with min UE support as needed Seated ball squeezes x20 6" step ups on Lt: forward and lateral 2x5 each with UE support on // bars  Gait with hemi walker: to CR gym and back   11/20/22: Objective meaures taken for reassessment Nustep L3 x6 min with PT present to monitor and discuss progress  Sit to stand: x10 without UE support LAQ 2.5# Lt 2x10, Rt 2x10 with 5" hold Tandem stance: Rt and Lt- next to barre 3x10 seconds each Standing marching 2x10 with 2.5# weight added-standing on balance pad Stepping over hurdles at barre: forward and lateral leading with Rt and Lt with min UE support as needed Seated ball squeezes x20 Alternating step taps on 6" step with single hand  support by PT x20   11/13/22: Nustep L3 x6 min with PT present to monitor and discuss progress  Sit to stand: x10 without UE support LAQ 2.5# Lt 2x10, Rt 2x10 with 5" hold Tandem stance: Rt and Lt- next to barre 3x10 seconds each Standing marching 2x10 with 2.5# weight added-standing on balance pad Stepping over hurdles at barre: forward and lateral leading with Rt and Lt with min UE support as needed Seated ball squeezes x20 Alternating step taps on 6"  step with single hand support by PT x20    PATIENT EDUCATION:  Education details: Access Code: [email or web address] Person educated: Patient Education method: Explanation, Demonstration, and Handouts Education comprehension: verbalized understanding and returned demonstration   HOME EXERCISE PROGRAM: Access Code: [email or web address] URL: [email or web address] Date: 09/27/2022 Prepared by: Claiborne Billings  Exercises - Seated Long Arc Quad  - 3 x daily - 7 x weekly -  2 sets - 10 reps - 5 hold - Seated March   - 3 x daily - 7 x weekly - 3 sets - 10 reps - Seated Heel Toe Raises   - 3 x daily - 7 x weekly - 2 sets - 10 reps - Seated Isometric Hip Adduction with Ball  - 3 x daily - 7 x weekly - 2 sets - 10 reps - Sit to Stand with Armchair  - 2 x daily - 7 x weekly - 2 sets - 5-10 reps - Standing Hip Abduction with Counter Support  - 1 x daily - 7 x weekly - 2 sets - 10 reps - Heel Raises with Counter Support  - 2 x daily - 7 x weekly - 2 sets - 10 reps  Patient Education - Walking with a Single General Kempfer - Modified 4 Point Gait Pattern  ASSESSMENT:   CLINICAL IMPRESSION:  Pt brought her quad cane with her and some of session spent working on gait training.  Pt with improved overall strength with improved independence with gait and increased ease with functional tasks.  Pt did well with gait training with quad cane on the Lt.  Pt provided verbal cues for step length and use of cane to counterbalance her Rt LE to prevent fatigue.   Pt did well with  balance exercises in the clinic and required stand by assistance for safety and cueing by PT. Patient will benefit from skilled PT to address the below impairments and improve overall function.   OBJECTIVE IMPAIRMENTS: Abnormal gait, decreased activity tolerance, decreased balance, decreased mobility, difficulty walking, decreased strength, decreased safety awareness, impaired perceived functional ability, impaired flexibility, postural dysfunction, and pain.    ACTIVITY LIMITATIONS: carrying, lifting, sitting, standing, stairs, transfers, hygiene/grooming, and locomotion level   PARTICIPATION LIMITATIONS: meal prep, cleaning, laundry, driving, shopping, and community activity   PERSONAL FACTORS: Age, Past/current experiences, and 1-2 comorbidities: CVA, falls, Rt hip fracture with ORIF  are also affecting patient's functional outcome.    REHAB POTENTIAL: Good   CLINICAL DECISION MAKING: Evolving/moderate complexity   EVALUATION COMPLEXITY: Moderate     GOALS: Goals reviewed with patient? Yes   SHORT TERM GOALS: Target date: 09/06/2022   Be independent in initial HEP Baseline: Goal status: Goal met 08/14/22   2.  Improve LE strength to perform sit to stand with moderate Lt UE support Baseline:  Goal status: Goal met 08/28/22  3.  Perform TUG in < or = to 60 seconds to reduce falls risk Baseline: 27 seconds (09/27/22) Goal status: MET      LONG TERM GOALS: Target date:  01/19/23   Be independent in advanced HEP Baseline: independent in current HEP and further progress is needed  Goal status: in progress    2.  Perform TUG in < or = to 15 seconds to reduce falls risk Baseline: 19.45 seconds (11/20/22) Goal status: In progress    3.  ambulate > or = to 350 feet in 3 minutes to improve community ambulation and independence  Baseline: 225 (11/20/22) Goal status: NEW    4.  Ambulate with hemi walker with supervision or no guard due to improved gait Baseline: able to do  this Goal status: MET   5.  perform 5x sit to stand in < or = to 14 seconds to reduce falls risk  Baseline: 18.68 seconds  (11/20/22)  Goal Status: NEW  6. Ambulate with cane for all distances and demonstrate independence due to improved stability  Baseline: using hemiwalker and will get cane soon.  Walked well with standard cane in the clinic  (11/20/22)  Goal status: INITIAL     PLAN:   PT FREQUENCY: 1-2x/week   PT DURATION: 8 weeks   PLANNED INTERVENTIONS: Therapeutic exercises, Therapeutic activity, Neuromuscular re-education, Balance training, Gait training, Patient/Family education, Self Care, Joint mobilization, Stair training, Dry Needling, Electrical stimulation, Cryotherapy, Moist heat, Taping, Manual therapy, and Re-evaluation   PLAN FOR NEXT SESSION: Continue to work on gait , strength and balance.  See how cane use is going at home Sigurd Sos, PT 12/13/22 4:15 PM

## 2022-12-13 NOTE — Therapy (Signed)
OUTPATIENT OCCUPATIONAL THERAPY  Treatment Session  Patient Name: Angela Buchanan MRN: [id number] DOB:1951/03/07, 72 y.o., female Today's Date: 12/13/2022  PCP: Glenis Smoker, MD REFERRING PROVIDER: Glenis Smoker, MD       END OF SESSION:  OT End of Session - 12/13/22 1326     Visit Number 19    Number of Visits 29    Date for OT Re-Evaluation 12/29/22    Authorization Type [employer]    OT Start Time [id number]    OT Stop Time 1403    OT Time Calculation (min) 40 min                             Past Medical History:  Diagnosis Date   Atypical chest pain 07/19/2021   Bigeminy    no current med.   Breast cancer ([place]) 06/2017   right   Bruises easily    CAD in native artery 10/31/2022   Carotid bruit 11/13/2019   Dental crowns present    History of diverticulitis    Hypertension    states under control with med., has been on med. x 5 yr.   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   Past Surgical History:  Procedure Laterality Date   ABDOMINAL HYSTERECTOMY     partial   BREAST LUMPECTOMY Right 06/22/2017   Malignant   BREAST LUMPECTOMY WITH RADIOACTIVE SEED AND SENTINEL LYMPH NODE BIOPSY Right 06/22/2017   Procedure: RIGHT BREAST LUMPECTOMY WITH RADIOACTIVE SEED AND RIGHT AXILLARY DEEP SENTINEL LYMPH NODE BIOPSY WITH BLUE DYE INJECTION;  Surgeon: Fanny Skates, MD;  Location: [facility];  Service: General;  Laterality: Right;   BREAST LUMPECTOMY WITH RADIOACTIVE SEED LOCALIZATION Left 06/22/2017   Procedure: LEFT BREAST LUMPECTOMY WITH RADIOACTIVE SEED LOCALIZATION;  Surgeon: Fanny Skates, MD;  Location: [facility];  Service: General;  Laterality:  Left;   CATARACT EXTRACTION W/ INTRAOCULAR LENS  IMPLANT, BILATERAL Bilateral    EXCISION OF BREAST BIOPSY Left 06/22/2017   benign   LOOP RECORDER INSERTION N/A 05/16/2022   Procedure: LOOP RECORDER INSERTION;  Surgeon: Vickie Epley, MD;  Location: [facility];  Service: Cardiovascular;  Laterality: N/A;   PERCUTANEOUS PINNING Right 06/22/2022   Procedure: PERCUTANEOUS PINNING OF RIGHT HIP;  Surgeon: Hiram Gash, MD;  Location: [facility];  Service: Orthopedics;  Laterality: Right;   TONSILLECTOMY     age 74   Patient Active Problem List   Diagnosis Date Noted   CAD in native artery 10/31/2022   Malnutrition of moderate degree 06/22/2022   Closed fracture of femur, neck ([place]) 06/21/2022   Urinary frequency 06/21/2022   Hypokalemia 06/21/2022   Heart murmur 06/21/2022   Hypothyroidism 06/21/2022   Dysphagia 05/17/2022   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   CVA (cerebral vascular accident) ([place]) 05/12/2022   Osteoporosis 01/24/2022   Atypical chest pain 07/19/2021   Temporomandibular joint (TMJ) pain 07/16/2020   Referred otalgia of both ears 07/16/2020   PAC (premature atrial contraction) 05/11/2020   PVC (premature ventricular contraction) 05/11/2020   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   Carotid bruit 11/13/2019   Pure hypercholesterolemia 11/13/2019   Malignant neoplasm of upper-outer quadrant of right breast in  female, estrogen receptor positive ([place]) 05/22/2017   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   Diplopia 09/17/2014   Dizziness and giddiness 09/17/2014   Essential hypertension 07/22/2014   Chest pain 07/22/2014   Family history of heart disease 07/22/2014    ONSET DATE: CVA 05/12/22  REFERRING DIAG: M79.641 (ICD-10-CM) - Pain in right hand  THERAPY DIAG:  Hemiplegia and hemiparesis following cerebral infarction affecting left non-dominant side (HCC)  Other lack of coordination  Pain in right arm  Muscle weakness (generalized)  Rationale for  Evaluation and Treatment: Rehabilitation  SUBJECTIVE:   SUBJECTIVE STATEMENT: Pt reports that she has been getting significant improvement in R arm since exercises last week and was able to open the refrigerator door by herself! Pt accompanied by: self and significant other (Husband, [name])  PERTINENT HISTORY: hypertension, breast cancer, hyperlipidemia, Lt hip fracture secondary to fall 06/2022, CVA 06/2022   PRECAUTIONS: Fall, Rt breast cancer with node removal, osteoporosis  WEIGHT BEARING RESTRICTIONS: No  PAIN:  Are you having pain? No  FALLS: Has patient fallen in last 6 months? Yes. Number of falls 1 fall, one week after d/c home from SNF  LIVING ENVIRONMENT: Lives with: lives with their spouse Lives in: House/apartment Stairs: Yes: External: 2 steps; on left going up Has following equipment at home: Gilford Rile - 2 wheeled, Hemi walker, Wheelchair (manual), shower chair, and Grab bars  PLOF: Independent and Independent with basic ADLs  PATIENT GOALS: to get use of my R arm again  OBJECTIVE:   HAND DOMINANCE: Right  ADLs: Transfers/ambulation related to ADLs: Mod I with hemi-walker Eating: using L hand for eating, husband is assisting with cutting up food Grooming: Mod I, using L hand UB Dressing: Mod assist LB Dressing: Max assist Toileting: Mod I Bathing: Supervision - Mod I Tub Shower transfers: Dentist: Shower seat with back and Grab bars  UPPER EXTREMITY ROM:    Active ROM Right eval Left eval Right 10/18/22  Right 11/15/22  Shoulder flexion 54   82  Shoulder abduction      Shoulder adduction      Shoulder extension      Shoulder internal rotation      Shoulder external rotation      Elbow flexion 108   132  Elbow extension -24   -12  Wrist flexion    38  Wrist extension    18  Wrist ulnar deviation      Wrist radial deviation      Wrist pronation      Wrist supination      (Blank rows = not tested)  Passive ROM Right eval Left eval  Right 10/18/22 Right 11/15/22  Shoulder flexion 65  76 86  Shoulder abduction      Shoulder adduction      Shoulder extension      Shoulder internal rotation      Shoulder external rotation      Elbow flexion      Elbow extension -17  -15 -10  Wrist flexion 46  56 60  Wrist extension 27  50 40  Wrist ulnar deviation      Wrist radial deviation      Wrist pronation      Wrist supination      (Blank rows = not tested)  Passive ROM Right 10/18/22 Right 11/15/22  Thumb MCP (0-60)    Thumb IP (0-80)    Thumb Radial abd/add (0-55)    Thumb Palmar abd/add (0-45)  Thumb Opposition to Small Finger    Index MCP (0-90)  0  Index PIP (0-100)  -5  Index DIP (0-70)   -3  Long MCP (0-90)     Long PIP (0-100) -30  -30  Long DIP (0-70) -20 -20  Ring MCP (0-90)     Ring PIP (0-100) -25  -21  Ring DIP (0-70) -15  -8  Little MCP (0-90)     Little PIP (0-100)   -18  Little DIP (0-70)   -4  (Blank rows = not tested)   UPPER EXTREMITY MMT:   not assessed due to pain with any PROM, AROM    HAND FUNCTION: Loose gross grasp   COORDINATION: Box and Blocks:  Right 2blocks, Left 45 blocks. Modified to complete with RUE.  Pt able to grasp and move across to other side of box with barrier removed.   11/01/22: RUE 13 blocks  SENSATION: Mild decrease in R  EDEMA: mild edema in wrist hand, and fingers on RUE  OBSERVATIONS: Pt guarded with movement, hypersensitive to touch and PROM.     TODAY'S TREATMENT:                                   12/13/22 Supine exercises: shoulder abduction, elbow flexion/extension with reaching to touch chin, and external/internal rotation in abduction.  Pt demonstrating difficulty maintaining arm in abducted position during external/internal rotation.  Discussed purpose of positioning and functional carryover of each movement. UE ROM: facilitated increased shoulder ROM, elbow extension, supination/pronation, and grip with picking up bean bags initially with hand  and then transitioning to scooping up and flipping with spatula.  OT providing tactile cues at shoulder to draw attention to and decrease shoulder hike during shoulder flexion/functional reach.  Increased challenge to include picking up rings and placing on vertical dowel.  Pt requiring assist to pick up initially but then able to pick up from slightly elevated surface to allow pt to get fingers underneath ring.  Pt then placing and removing from dowel with initial tactile cues fading to supervision only with improved awareness of shoulder positioning and very minimal shoulder hike.   Box and blocks: R: 11 blocks.  Pt reports arm is tired as completing at end of session.   12/06/22 Supine exercises: engaged in snow angel with focus on increased shoulder ROM in combination with keeping forearm and wrist in contact with mat table.  Pt demonstrating ability to keep finger tips in contact with table top with cues, however unable to keep forearm in contact with increased shoulder abduction. Transitioned to more active ROM with shoulder abduction, elbow flexion/extension to tap chin, hooklying single arm chest press with OT providing intermittent cues and tactile support at elbow to further facilitate extension in abduction and then when completing chest press. Wrist: OT demonstrated supination/pronation and wrist flexion/extension with use of table top to decrease compensatory arm movements during supination/pronation and wrist flexion/extension.  Pt demonstrating improvement in wrist flexion and extension with forearm supported on table top.  Pt demonstrating full pronation, however continuing to demonstrate decreased supination achieving 20* from neutral. Coordination/functional reach: engaged in picking up large jack pieces with R hand and incorporating shoulder flexion to achieve increased functional reach to place jacks into container at midline.  Transitioned to picking up checker pieces.  Pt able to pick up  and move checker pieces with increased effort, therefore transitioned to attempt to stack  pieces.  Pt utilizing thumb and long finger when picking up and then able to stack and unstack checkers with good grasp and release.   11/28/22 UE ROM: facilitated increased shoulder ROM, elbow extension, and grip with picking up bean bags and placing to matching targets.  Pt demonstrating gross grasp on bean bag, but improved shoulder flexion and elbow extension to reach towards each matching colored spot incorporating crossing midline and reaching outside BOS.  OT increased challenge to incorporating supination when releasing bean bags into basket.  Pt initially demonstrating 60% of supination and increased difficulty with releasing bean bags while in supination.  OT modifying task to facilitate supination and then return to neutral prior to releasing to allow for increased release of grasp.  Pt recruiting mild compensatory trunk movement to increase supination.   Towel scrunches: OT instructing pt to utilize finger flexion and extension to scrunch paper towel into fist.  Pt demonstrating difficulty with extension, especially digit 3 and 4, when opening/adjusting to continue to gather towel into hand.  OT providing cues for hand placement for improved technique, still with difficulty with finger extension.   PATIENT EDUCATION: Education details: ongoing condition specific education and progressive exercises. Person educated: Patient and Spouse Education method: Explanation, Demonstration, Tactile cues, Verbal cues, and Handouts Education comprehension: verbalized understanding and needs further education  HOME EXERCISE PROGRAM: Access Code: [email or web address] URL: [email or web address] Date: 12/06/2022 Prepared by: [facility]  Exercises - Supine Shoulder Flexion AAROM with Dowel  - 1 x daily - 3 x weekly - 2 sets - 10 reps - Supine Shoulder Protraction with Dowel   - 1 x daily - 3 x weekly - 2 sets - 10 reps - Supine Shoulder Abduction AAROM with Dowel  - 1 x daily - 3 x weekly - 2 sets - 10 reps - Touch finger to nose  - 1 x daily - 3 x weekly - 2 sets - 10 reps - 5-10 sec hold - Touch finger to nose  - 1 x daily - 3 x weekly - 2 sets - 10 reps - Hooklying Single Arm Chest Press  - 1 x daily - 3 x weekly - 2 sets - 10 reps - Thumb AROM Opposition  - 1 x daily - 7 x weekly - 1 sets - 5 reps - Seated Finger Composite Flexion Stretch  - 1 x daily - 7 x weekly - 1 sets - 5 reps - Hand PROM Finger Extension  - 1 x daily - 7 x weekly - 1 sets - 10 reps - Seated Forearm Pronation Supination AROM  - 1 x daily - 7 x weekly - 1 sets - 10 reps - Seated Wrist Flexion Extension PROM  - 1 x daily - 7 x weekly - 1 sets - 10 reps   GOALS: Goals reviewed with patient? Yes  SHORT TERM GOALS: Target date: 12/08/22  Pt and spouse will be independent with updated ROM and coordination HEP. Baseline: Goal status: MET - 12/06/22  2.  Pt will verbalize understanding of task modifications and/or potential AE needs to increase ease, safety, and independence w/ ADLs. Baseline:  Goal status: MET - 12/06/22     LONG TERM GOALS: Target date: 12/29/22  1.  Pt will demonstrate ability to demonstrate improved functional grasp in RUE to open new jar, increased handwriting, and self-feeding. Baseline:  Goal status: IN PROGRESS  2.  Pt will demonstrate improved UE functional use for ADLs as  evidenced by increasing box/ blocks score by 5 blocks with RUE, to be completed in standardized manner. Baseline:  13 blocks with RUE over barrier on 11/01/22 Goal status: IN PROGRESS  3.  Pt will demonstrate improved shoulder ROM to allow for reach to moderate height as needed for ADLs/IADLs (washing back, putting on jacket, etc).  Baseline:  Goal status: IN PROGRESS  4. Pt will demonstrate improved extension in long and ring finger to allow for more functional hand  opening.  Baseline:  Goal status: IN PROGRESS  5.  Pt will report decreased pain in dominant RUE interfering with ADLs/IADLs by decreased impairment score on Quick DASH. Baseline: 77% Goal status: IN PROGRESS  6.  Pt will be independent with progressive splint wear and care PRN Baseline: modifications to splint TBD Goal status: IN PROGRESS  ASSESSMENT:  CLINICAL IMPRESSION: Pt demonstrating improved functional grasp, forearm supination/pronation, and wrist circumduction with manipulation of items with use of spatula.  Pt demonstrating improved functional reach as well as carryover of exercises into function with opening refrigerator as well as other functional tasks at home.  Pt demonstrating decreased external rotation limiting functional reach to brush hair.  Pt demonstrating good carryover of education to decrease shoulder hike during functional reach. Pt continues to demonstrate flexion contractures in long and ring finger, with difficulty with extension during blocked exercises and function. Pt continues to benefit from encouragement to increase functional use of RUE into self-care tasks and explanation of typical recovery process in regards to benefits of mutti-disciplinary approach with PM&R in addition to OT/PT.  PERFORMANCE DEFICITS: in functional skills including ADLs, IADLs, coordination, sensation, edema, tone, ROM, strength, pain, flexibility, Fine motor control, Gross motor control, balance, body mechanics, endurance, decreased knowledge of use of DME, skin integrity, vision, and UE functional use, cognitive skills including emotional and safety awareness, and psychosocial skills including coping strategies.   IMPAIRMENTS: are limiting patient from ADLs, IADLs, and rest and sleep.   CO-MORBIDITIES: may have co-morbidities  that affects occupational performance. Patient will benefit from skilled OT to address above impairments and improve overall function.  MODIFICATION OR  ASSISTANCE TO COMPLETE EVALUATION: Min-Moderate modification of tasks or assist with assess necessary to complete an evaluation.  OT OCCUPATIONAL PROFILE AND HISTORY: Detailed assessment: Review of records and additional review of physical, cognitive, psychosocial history related to current functional performance.  CLINICAL DECISION MAKING: Moderate - several treatment options, min-mod task modification necessary  REHAB POTENTIAL: Good  EVALUATION COMPLEXITY: Moderate    PLAN:  OT FREQUENCY: 2x/week  OT DURATION: 8 weeks  PLANNED INTERVENTIONS: self care/ADL training, therapeutic exercise, therapeutic activity, neuromuscular re-education, manual therapy, passive range of motion, balance training, functional mobility training, splinting, paraffin, fluidotherapy, biofeedback, compression bandaging, moist heat, cryotherapy, contrast bath, patient/family education, visual/perceptual remediation/compensation, psychosocial skills training, coping strategies training, and DME and/or AE instructions  RECOMMENDED OTHER SERVICES: NA  CONSULTED AND AGREED WITH PLAN OF CARE: Patient and family member/caregiver  PLAN FOR NEXT SESSION: Review splint fit and make modification as needed.  AAROM or self-ROM to include shoulder flexion/extension and internal/external rotation, elbow, forearm.  Functional grasp and release.  Attempt handwriting and/or self-feeding.   Simonne Come, OTR/L 12/13/2022, 1:26 PM

## 2022-12-14 ENCOUNTER — Encounter: Payer: Medicare PPO | Attending: Physical Medicine & Rehabilitation | Admitting: Physical Medicine & Rehabilitation

## 2022-12-14 ENCOUNTER — Encounter: Payer: Self-pay | Admitting: Physical Medicine & Rehabilitation

## 2022-12-14 VITALS — BP 132/65 | HR 59 | Ht 62.0 in | Wt 101.0 lb

## 2022-12-14 DIAGNOSIS — M7501 Adhesive capsulitis of right shoulder: Secondary | ICD-10-CM | POA: Insufficient documentation

## 2022-12-14 DIAGNOSIS — G8111 Spastic hemiplegia affecting right dominant side: Secondary | ICD-10-CM | POA: Diagnosis not present

## 2022-12-14 NOTE — Progress Notes (Signed)
Subjective:    Patient ID: Angela Buchanan, female    DOB: 03/25/1951, 72 y.o.   MRN: [id number] 72 y.o. female with a history of hypertension, HLD, atypical CP, ventricular bigeminy/PAC/PVC, right breast cancer and carotid bruit, who presented to [facility] with acute right sided weakness and difficulty speaking. She stated that she was writing and suddenly was unable to use her right hand to write and then with difficulty speaking and  right-sided weakness and numbness with difficulty walking which resulted in the decision to be seen in the ED where a Code Stroke was called. NIHSS 4. tNK was administered. Cleviprex was initiated for BP 180/87. CTA showed  No large vessel occlusion, hemodynamically significant stenosis, or evidence of dissection. CTH negative for acute finding. Moderate stenosis left P1-P2 PCA junctio. The patient was then sent to [facility] for post-TNK management and stroke work up.   Date of Admission: 05/12/2022 Date of Discharge: 05/17/2022 D/C to SNF then to Home HPI  [place] at home Sept 20, 2023 suffering Right FNF requiring R hip ORIF pinning went to SNF 06/26/22-07/23/22 OP PT 08/09/22 was amb with HW 25 SBA;Initial eval OT eval 09/13/22, working on Arts administrator. The patient has some pain in the right shoulder mainly with overhead elevation.  She denies any discomfort following the radiation she received for the right breast cancer In addition the patient has some difficulty with extending the elbow and fingers on the right hand.  She particularly notices the right elbow flexion during ambulation. From a functional standpoint she continues to require assistance for dressing bathing toileting meal prep household duties and shopping from her husband.  She is able to climb 3 steps with a railing.  Her walking tolerance is approximately 10 minutes.  She does not drive anymore.  2 Tylenol's and 1 Advil per day.  She continues outpatient PT and OT ongoing basis.  Husband is very pleased  with her progress.  MRI HEAD WITHOUT CONTRAST   TECHNIQUE: Multiplanar, multiecho pulse sequences of the brain and surrounding structures were obtained without intravenous contrast.   COMPARISON:  CT head without contrast and CTA head and neck 05/12/2022   FINDINGS: Brain: Acute/subacute nonhemorrhagic infarct present in the left corona radiata along the lateral margin of the left ventricle. The infarcted territory measures 21 x 15 x 12 mm.   Moderate atrophy and confluent periventricular T2 hyperintensity is present separate from the acute infarct. The ventricles are proportionate to the degree of atrophy. No significant extraaxial fluid collection is present. The internal auditory canals are within normal limits. The brainstem and cerebellum are within normal limits.   Vascular: Flow is present in the major intracranial arteries.   Skull and upper cervical spine: The craniocervical junction is normal. Upper cervical spine is within normal limits. Marrow signal is unremarkable.   Sinuses/Orbits: The paranasal sinuses and mastoid air cells are clear. Bilateral lens replacements are noted. Globes and orbits are otherwise unremarkable.   IMPRESSION: 1. Acute/subacute nonhemorrhagic infarct involving the left corona radiata along the lateral margin of the left ventricle. 2. Moderate atrophy and confluent periventricular white matter disease otherwise likely reflects the sequela of chronic microvascular ischemia.   The above was relayed via text pager to Dr. Rory Percy On 05/13/2022 at 19:47 .     Electronically Signed   By: San Morelle M.D.   On: 05/13/2022 19:47 Pain Inventory Average Pain 6 Pain Right Now 6 My pain is intermittent and aching  In the last 24 hours, has  pain interfered with the following? General activity 7 Relation with others 2 Enjoyment of Angela 4 What TIME of day is your pain at its worst? daytime Sleep (in general) Good  Pain is worse  with: some activites Pain improves with: medication Relief from Meds: 7  Mobility Walk with assistance Use a walker Walk 10 mins Ability to climb stair yes Drive no Needs help with transfers  retired I need assistance with the following:  feeding, dressing, bathing, toileting, meal prep, household duties, and shopping Do you have any goals in this area?  yes  trouble walking  New patient  Primary care      Family History  Problem Relation Age of Onset   Hypertension Sister    Dementia Father    Stroke Mother    Stroke Maternal Grandmother    Stroke Maternal Grandfather    Stroke Paternal Grandmother    Stroke Paternal Grandfather    Stroke Sister    Stroke Brother    Dementia Brother    Heart attack Brother    Social History   Socioeconomic History   Marital status: Married    Spouse name: Not on file   Number of children: 0   Years of education: college   Highest education level: Not on file  Occupational History   Occupation: Retried Product manager: [employer]  Tobacco Use   Smoking status: Never   Smokeless tobacco: Never  Vaping Use   Vaping Use: Never used  Substance and Sexual Activity   Alcohol use: No    Alcohol/week: 0.0 standard drinks of alcohol   Drug use: No   Sexual activity: Never    Birth control/protection: Surgical    Comment: hyst  Other Topics Concern   Not on file  Social History Narrative   Patient lives at home with husband  ([name]).    Patient is rtired caoll   Right handed.   Caffeine 8oz . Caffeine daily   Social Determinants of Health   Financial Resource Strain: Not on file  Food Insecurity: No Food Insecurity (06/21/2022)   Hunger Vital Sign    Worried About Running Out of Food in the Last Year: Never true    Ran Out of Food in the Last Year: Never true  Transportation Needs: No Transportation Needs (06/21/2022)   PRAPARE - Hydrologist (Medical): No    Lack of Transportation  (Non-Medical): No  Physical Activity: Not on file  Stress: Not on file  Social Connections: Not on file   Past Surgical History:  Procedure Laterality Date   ABDOMINAL HYSTERECTOMY     partial   BREAST LUMPECTOMY Right 06/22/2017   Malignant   BREAST LUMPECTOMY WITH RADIOACTIVE SEED AND SENTINEL LYMPH NODE BIOPSY Right 06/22/2017   Procedure: RIGHT BREAST LUMPECTOMY WITH RADIOACTIVE SEED AND RIGHT AXILLARY DEEP SENTINEL LYMPH NODE BIOPSY WITH BLUE DYE INJECTION;  Surgeon: Fanny Skates, MD;  Location: [place];  Service: General;  Laterality: Right;   BREAST LUMPECTOMY WITH RADIOACTIVE SEED LOCALIZATION Left 06/22/2017   Procedure: LEFT BREAST LUMPECTOMY WITH RADIOACTIVE SEED LOCALIZATION;  Surgeon: Fanny Skates, MD;  Location: [place];  Service: General;  Laterality: Left;   CATARACT EXTRACTION W/ INTRAOCULAR LENS  IMPLANT, BILATERAL Bilateral    EXCISION OF BREAST BIOPSY Left 06/22/2017   benign   LOOP RECORDER INSERTION N/A 05/16/2022   Procedure: LOOP RECORDER INSERTION;  Surgeon: Vickie Epley, MD;  Location: [place]  [facility];  Service: Cardiovascular;  Laterality: N/A;   PERCUTANEOUS PINNING Right 06/22/2022   Procedure: PERCUTANEOUS PINNING OF RIGHT HIP;  Surgeon: Hiram Gash, MD;  Location: [facility];  Service: Orthopedics;  Laterality: Right;   TONSILLECTOMY     age 22   Past Medical History:  Diagnosis Date   Atypical chest pain 07/19/2021   Bigeminy    no current med.   Breast cancer ([place]) 06/2017   right   Bruises easily    CAD in native artery 10/31/2022   Carotid bruit 11/13/2019   Dental crowns present    History of diverticulitis    Hypertension    states under control with med., has been on med. x 5 yr.   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 Right shoulder has positive impingement sign also has pain with external rotation as well as internal rotation of the shoulder. There is no evidence of subluxation. Right elbow has full passive range of motion right fingers and wrist have full passive range of motion Tone MAS 2/3 at the right elbow flexors MAS 2/3 at the wrist flexors MAS 2/3 in the right finger flexors, MAS 3 in the right pronators Motor strength is 3 - right deltoid bicep tricep finger flexors and extensors Right lower extremity is 4/5 in hip flexor knee extensor ankle dorsiflexion 5/5 strength in the left upper and left lower limb. Ambulates with a hemiwalker she clears her toe well.  She has no evidence of knee instability      Assessment & Plan:   #1.  Spasticity following left CVA she has tried inpatient and outpatient rehabilitation with PT and OT.  She has tried oral muscle relaxers. She would be a good candidate for botulinum toxin injection at a low to moderate dosage.  We discussed the possibility of having increased weakness of the fingers on a temporary basis following injection as well as the need for potential dosage escalation if inadequate response.  We discussed that botulinum toxin injection does not restore normal motor function or increased strength.  Right  Pronator 50U FDS 25 FDP 25 FCR 25 Biceps 75  2.  Frozen shoulder consider glenohumeral injection with corticosteroid   In addition we  discussed briefly vagal nerve stimulation, may be a candidate in the future she is just across the 22-monthmark post stroke.  She has had infarct

## 2022-12-18 ENCOUNTER — Ambulatory Visit: Payer: Medicare PPO | Admitting: Occupational Therapy

## 2022-12-18 DIAGNOSIS — M6281 Muscle weakness (generalized): Secondary | ICD-10-CM | POA: Diagnosis not present

## 2022-12-18 DIAGNOSIS — M79601 Pain in right arm: Secondary | ICD-10-CM

## 2022-12-18 DIAGNOSIS — R278 Other lack of coordination: Secondary | ICD-10-CM

## 2022-12-18 DIAGNOSIS — I69354 Hemiplegia and hemiparesis following cerebral infarction affecting left non-dominant side: Secondary | ICD-10-CM

## 2022-12-18 NOTE — Therapy (Signed)
OUTPATIENT OCCUPATIONAL THERAPY  Treatment Session & Progress Note  Patient Name: Angela Buchanan MRN: [id number] DOB:Mar 16, 1951, 71 y.o., female Today's Date: 12/18/2022  PCP: Glenis Smoker, MD REFERRING PROVIDER: Glenis Smoker, MD  Occupational Therapy Progress Note  Dates of Reporting Period: 10/18/22 to 12/18/22  Objective Measurements: Pt is demonstrating mild improvements in shoulder flexion and elbow flexion/extension.  Pt continues to demonstrate decreased digit extension, however pt is scheduled for botox injection into digits and biceps to allow for increased ROM and functional reach.  Pt has progressed by 6 blocks with Box and Blocks assessment with RUE. OT fabricated resting hand splint which pt is wearing periodically during the day and majority of overnight time to continue to provide UE support and stretching of digits.  Plan: Continue to focus on PROM with flexion/extension of digits, wrist, elbow, and shoulder. Pt is to receive botox injection into digits and biceps 01/11/23 and pt would benefit from continued OT services to maximize effects of botox on functional use of dominant RUE.      END OF SESSION:  OT End of Session - 12/18/22 1535     Visit Number 20    Number of Visits 29    Date for OT Re-Evaluation 12/29/22    Authorization Type [employer]    OT Start Time 1535    OT Stop Time 1615    OT Time Calculation (min) 40 min                              Past Medical History:  Diagnosis Date   Atypical chest pain 07/19/2021   Bigeminy    no current med.   Breast cancer ([place]) 06/2017   right   Bruises easily    CAD in native artery 10/31/2022   Carotid bruit 11/13/2019   Dental crowns present    History of diverticulitis    Hypertension    states under control with med., has been on med. x 5 yr.   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   Past Surgical History:  Procedure Laterality Date   ABDOMINAL HYSTERECTOMY     partial   BREAST LUMPECTOMY Right 06/22/2017   Malignant   BREAST LUMPECTOMY WITH RADIOACTIVE SEED AND SENTINEL LYMPH NODE BIOPSY Right 06/22/2017   Procedure: RIGHT BREAST LUMPECTOMY WITH RADIOACTIVE SEED AND RIGHT AXILLARY DEEP SENTINEL LYMPH NODE BIOPSY WITH BLUE DYE INJECTION;  Surgeon: Fanny Skates, MD;  Location: [facility];  Service: General;  Laterality: Right;   BREAST LUMPECTOMY WITH RADIOACTIVE SEED LOCALIZATION Left 06/22/2017   Procedure: LEFT BREAST LUMPECTOMY WITH RADIOACTIVE SEED LOCALIZATION;  Surgeon: Fanny Skates, MD;  Location: [facility];  Service: General;  Laterality: Left;   CATARACT EXTRACTION W/ INTRAOCULAR LENS  IMPLANT, BILATERAL Bilateral    EXCISION OF BREAST BIOPSY Left 06/22/2017   benign   LOOP RECORDER INSERTION N/A 05/16/2022   Procedure: LOOP RECORDER INSERTION;  Surgeon: Vickie Epley, MD;  Location: [facility];  Service: Cardiovascular;  Laterality: N/A;   PERCUTANEOUS PINNING Right 06/22/2022   Procedure: PERCUTANEOUS PINNING OF RIGHT HIP;  Surgeon: Hiram Gash, MD;  Location: [facility];  Service: Orthopedics;  Laterality: Right;  TONSILLECTOMY     age 33   Patient Active Problem List   Diagnosis Date Noted   Right spastic hemiplegia ([place]) 12/14/2022   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   CAD in native artery 10/31/2022   Malnutrition of moderate degree 06/22/2022   Closed fracture of femur, neck ([place]) 06/21/2022   Urinary frequency 06/21/2022   Hypokalemia 06/21/2022   Heart murmur 06/21/2022   Hypothyroidism 06/21/2022   Dysphagia 05/17/2022   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   CVA (cerebral vascular accident)  ([place]) 05/12/2022   Osteoporosis 01/24/2022   Atypical chest pain 07/19/2021   Temporomandibular joint (TMJ) pain 07/16/2020   Referred otalgia of both ears 07/16/2020   PAC (premature atrial contraction) 05/11/2020   PVC (premature ventricular contraction) 05/11/2020   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   Carotid bruit 11/13/2019   Pure hypercholesterolemia 11/13/2019   Malignant neoplasm of upper-outer quadrant of right breast in female, estrogen receptor positive ([place]) 05/22/2017   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   Diplopia 09/17/2014   Dizziness and giddiness 09/17/2014   Essential hypertension 07/22/2014   Chest pain 07/22/2014   Family history of heart disease 07/22/2014    ONSET DATE: CVA 05/12/22  REFERRING DIAG: M79.641 (ICD-10-CM) - Pain in right hand  THERAPY DIAG:  Hemiplegia and hemiparesis following cerebral infarction affecting left non-dominant side (HCC)  Other lack of coordination  Pain in right arm  Muscle weakness (generalized)  Rationale for Evaluation and Treatment: Rehabilitation  SUBJECTIVE:   SUBJECTIVE STATEMENT: Pt reports that she has been getting significant improvement in R arm since exercises last week and was able to open the refrigerator door by herself! Pt accompanied by: self and significant other (Husband, [name])  PERTINENT HISTORY: hypertension, breast cancer, hyperlipidemia, Lt hip fracture secondary to fall 06/2022, CVA 06/2022   PRECAUTIONS: Fall, Rt breast cancer with node removal, osteoporosis  WEIGHT BEARING RESTRICTIONS: No  PAIN:  Are you having pain? No  FALLS: Has patient fallen in last 6 months? Yes. Number of falls 1 fall, one week after d/c home from SNF  LIVING ENVIRONMENT: Lives with: lives with their spouse Lives in: House/apartment Stairs: Yes: External: 2 steps; on left going up Has following equipment at home: Gilford Rile - 2 wheeled, Hemi walker, Wheelchair (manual), shower chair, and Grab bars  PLOF:  Independent and Independent with basic ADLs  PATIENT GOALS: to get use of my R arm again  OBJECTIVE:   HAND DOMINANCE: Right  ADLs: Transfers/ambulation related to ADLs: Mod I with hemi-walker Eating: using L hand for eating, husband is assisting with cutting up food Grooming: Mod I, using L hand UB Dressing: Mod assist LB Dressing: Max assist Toileting: Mod I Bathing: Supervision - Mod I Tub Shower transfers: Min assist Equipment: Shower seat with back and Grab bars  UPPER EXTREMITY ROM:    Active ROM Right eval Left eval Right 10/18/22  Right 11/15/22 Right 12/18/22  Shoulder flexion 54   82 86  Shoulder abduction       Shoulder adduction       Shoulder extension       Shoulder internal rotation       Shoulder external rotation       Elbow flexion 108   132 136  Elbow extension -24   -12 -12  Wrist flexion    38 36  Wrist extension    18 24  Wrist ulnar deviation       Wrist radial deviation  Wrist pronation       Wrist supination       (Blank rows = not tested)  Passive ROM Right eval Left eval Right 10/18/22 Right 11/15/22 Right 12/18/22  Shoulder flexion 65  76 86 94  Shoulder abduction       Shoulder adduction       Shoulder extension       Shoulder internal rotation       Shoulder external rotation       Elbow flexion       Elbow extension -17  -15 -10 0  Wrist flexion 46  56 60 60  Wrist extension 27  50 40 56  Wrist ulnar deviation       Wrist radial deviation       Wrist pronation       Wrist supination       (Blank rows = not tested)  Passive ROM Right 10/18/22 Right 11/15/22 Right 12/18/22  Thumb MCP (0-60)     Thumb IP (0-80)     Thumb Radial abd/add (0-55)     Thumb Palmar abd/add (0-45)     Thumb Opposition to Small Finger     Index MCP (0-90)  0   Index PIP (0-100)  -5   Index DIP (0-70)   -3   Long MCP (0-90)      Long PIP (0-100) -30  -30 -30  Long DIP (0-70) -20 -20 -5  Ring MCP (0-90)      Ring PIP (0-100) -25  -21 -30   Ring DIP (0-70) -15  -8 -7  Little MCP (0-90)      Little PIP (0-100)   -18   Little DIP (0-70)   -4   (Blank rows = not tested)   UPPER EXTREMITY MMT:   not assessed due to pain with any PROM, AROM    HAND FUNCTION: Loose gross grasp  R gross grasp: 5#; L gross grasp: 34#  COORDINATION: Box and Blocks:  Right 2blocks, Left 45 blocks. Modified to complete with RUE.  Pt able to grasp and move across to other side of box with barrier removed.   11/01/22: RUE 13 blocks 12/18/22: RUE 19 blocks  SENSATION: Mild decrease in R  EDEMA: mild edema in wrist hand, and fingers on RUE  OBSERVATIONS: Pt guarded with movement, hypersensitive to touch and PROM.     TODAY'S TREATMENT:                                   12/18/22 Box and blocks: RUE: 19 blocks  Pt demonstrating decreased shoulder hike and improved grasp and release this session, still demonstrating decreased finger extension with release of blocks. Measurements taken (see above) with pt demonstrating improvements in R shoulder and elbow mobility, still demonstrating decreased extension of all digits, especially index and long finger.  Self-feeding: Pt utilizing built up handle and gross grasp with simulated self-feeding with scooping beans from bowl.  Pt benefiting from built up handle to maintain sustained, loose grasp to engage in self-feeding.  OT educated on finger feeding to challenge various pinches, pinch strength, and shoulder ROM.  Pt able to touch to chin in upright position, encouraged attempts at self-feeding before next session. UE ROM: OT provided education on Hooklying Single Arm Chest Press with focus on motor control and not hiking shoulder during movement.  Pt is demonstrating increased elbow extension even in sitting  this session.   12/13/22 Supine exercises: shoulder abduction, elbow flexion/extension with reaching to touch chin, and external/internal rotation in abduction.  Pt demonstrating difficulty maintaining  arm in abducted position during external/internal rotation.  Discussed purpose of positioning and functional carryover of each movement. UE ROM: facilitated increased shoulder ROM, elbow extension, supination/pronation, and grip with picking up bean bags initially with hand and then transitioning to scooping up and flipping with spatula.  OT providing tactile cues at shoulder to draw attention to and decrease shoulder hike during shoulder flexion/functional reach.  Increased challenge to include picking up rings and placing on vertical dowel.  Pt requiring assist to pick up initially but then able to pick up from slightly elevated surface to allow pt to get fingers underneath ring.  Pt then placing and removing from dowel with initial tactile cues fading to supervision only with improved awareness of shoulder positioning and very minimal shoulder hike.   Box and blocks: R: 11 blocks.  Pt reports arm is tired as completing at end of session.   12/06/22 Supine exercises: engaged in snow angel with focus on increased shoulder ROM in combination with keeping forearm and wrist in contact with mat table.  Pt demonstrating ability to keep finger tips in contact with table top with cues, however unable to keep forearm in contact with increased shoulder abduction. Transitioned to more active ROM with shoulder abduction, elbow flexion/extension to tap chin, hooklying single arm chest press with OT providing intermittent cues and tactile support at elbow to further facilitate extension in abduction and then when completing chest press. Wrist: OT demonstrated supination/pronation and wrist flexion/extension with use of table top to decrease compensatory arm movements during supination/pronation and wrist flexion/extension.  Pt demonstrating improvement in wrist flexion and extension with forearm supported on table top.  Pt demonstrating full pronation, however continuing to demonstrate decreased supination achieving 20*  from neutral. Coordination/functional reach: engaged in picking up large jack pieces with R hand and incorporating shoulder flexion to achieve increased functional reach to place jacks into container at midline.  Transitioned to picking up checker pieces.  Pt able to pick up and move checker pieces with increased effort, therefore transitioned to attempt to stack pieces.  Pt utilizing thumb and long finger when picking up and then able to stack and unstack checkers with good grasp and release.   PATIENT EDUCATION: Education details: ongoing condition specific education and progressive exercises. Person educated: Patient and Spouse Education method: Explanation, Demonstration, Tactile cues, Verbal cues, and Handouts Education comprehension: verbalized understanding and needs further education  HOME EXERCISE PROGRAM: Access Code: [email or web address] URL: [email or web address] Date: 12/06/2022 Prepared by: [facility]  Exercises - Supine Shoulder Flexion AAROM with Dowel  - 1 x daily - 3 x weekly - 2 sets - 10 reps - Supine Shoulder Protraction with Dowel  - 1 x daily - 3 x weekly - 2 sets - 10 reps - Supine Shoulder Abduction AAROM with Dowel  - 1 x daily - 3 x weekly - 2 sets - 10 reps - Touch finger to nose  - 1 x daily - 3 x weekly - 2 sets - 10 reps - 5-10 sec hold - Touch finger to nose  - 1 x daily - 3 x weekly - 2 sets - 10 reps - Hooklying Single Arm Chest Press  - 1 x daily - 3 x weekly - 2 sets - 10 reps - Thumb AROM Opposition  - 1  x daily - 7 x weekly - 1 sets - 5 reps - Seated Finger Composite Flexion Stretch  - 1 x daily - 7 x weekly - 1 sets - 5 reps - Hand PROM Finger Extension  - 1 x daily - 7 x weekly - 1 sets - 10 reps - Seated Forearm Pronation Supination AROM  - 1 x daily - 7 x weekly - 1 sets - 10 reps - Seated Wrist Flexion Extension PROM  - 1 x daily - 7 x weekly - 1 sets - 10 reps   GOALS: Goals reviewed with patient?  Yes  SHORT TERM GOALS: Target date: 12/08/22  Pt and spouse will be independent with updated ROM and coordination HEP. Baseline: Goal status: MET - 12/06/22  2.  Pt will verbalize understanding of task modifications and/or potential AE needs to increase ease, safety, and independence w/ ADLs. Baseline:  Goal status: MET - 12/06/22     LONG TERM GOALS: Target date: 12/29/22  1.  Pt will demonstrate ability to demonstrate improved functional grasp in RUE to open new jar, increased handwriting, and self-feeding. Baseline:  Goal status: IN PROGRESS  2.  Pt will demonstrate improved UE functional use for ADLs as evidenced by increasing box/ blocks score by 5 blocks with RUE, to be completed in standardized manner. Baseline:  13 blocks with RUE over barrier on 11/01/22 Goal status: Met - 19 blocks on 12/18/22  3.  Pt will demonstrate improved shoulder ROM to allow for reach to moderate height as needed for ADLs/IADLs (washing back, putting on jacket, etc).  Baseline:  Goal status: IN PROGRESS  4. Pt will demonstrate improved extension in long and ring finger to allow for more functional hand opening.  Baseline:  Goal status: IN PROGRESS  5.  Pt will report decreased pain in dominant RUE interfering with ADLs/IADLs by decreased impairment score on Quick DASH. Baseline: 77% Goal status: IN PROGRESS  6.  Pt will be independent with progressive splint wear and care PRN Baseline: modifications to splint TBD Goal status: IN PROGRESS  ASSESSMENT:  CLINICAL IMPRESSION: Pt demonstrating improved motor control and sustained grasp on utensil with built up handle during simulated self-feeding.  Pt demonstrating good carryover of education to decrease shoulder hike during functional reach and exercises. Pt is demonstrating mild improvements in shoulder and elbow ROM.  Pt continues to demonstrate flexion contractures in long and ring finger, with difficulty with extension during blocked exercises and  function. Pt continues to benefit from encouragement to increase functional use of RUE into self-care tasks.  Pt with recent appt with PM&R with upcoming plan for botox injection in conjunction with therapy to continue to address RUE. 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  PERFORMANCE DEFICITS: in functional skills including ADLs, IADLs, coordination, sensation, edema, tone, ROM, strength, pain, flexibility, Fine motor control, Gross motor control, balance, body mechanics, endurance, decreased knowledge of use of DME, skin integrity, vision, and UE functional use, cognitive skills including emotional and safety awareness, and psychosocial skills including coping strategies.   IMPAIRMENTS: are limiting patient from ADLs, IADLs, and rest and sleep.   CO-MORBIDITIES: may have co-morbidities  that affects occupational performance. Patient will benefit from skilled OT to address above impairments and improve overall function.  MODIFICATION OR ASSISTANCE TO COMPLETE EVALUATION: Min-Moderate modification of tasks or assist with assess necessary to complete an evaluation.  OT OCCUPATIONAL  PROFILE AND HISTORY: Detailed assessment: Review of records and additional review of physical, cognitive, psychosocial history related to current functional performance.  CLINICAL DECISION MAKING: Moderate - several treatment options, min-mod task modification necessary  REHAB POTENTIAL: Good  EVALUATION COMPLEXITY: Moderate    PLAN:  OT FREQUENCY: 2x/week  OT DURATION: 8 weeks  PLANNED INTERVENTIONS: self care/ADL training, therapeutic exercise, therapeutic activity, neuromuscular re-education, manual therapy, passive range of motion, balance training, functional mobility training, splinting, paraffin, fluidotherapy, biofeedback, compression bandaging, moist heat, cryotherapy, contrast bath, patient/family  education, visual/perceptual remediation/compensation, psychosocial skills training, coping strategies training, and DME and/or AE instructions  RECOMMENDED OTHER SERVICES: NA  CONSULTED AND AGREED WITH PLAN OF CARE: Patient and family member/caregiver  PLAN FOR NEXT SESSION: Review splint fit and make modification as needed.  AAROM or self-ROM to include shoulder flexion/extension and internal/external rotation, elbow, forearm.  Functional grasp and release.  Attempt handwriting and/or self-feeding.   Simonne Come, OTR/L 12/18/2022, 3:36 PM

## 2022-12-20 ENCOUNTER — Ambulatory Visit: Payer: Medicare PPO

## 2022-12-20 DIAGNOSIS — M6281 Muscle weakness (generalized): Secondary | ICD-10-CM | POA: Diagnosis not present

## 2022-12-20 DIAGNOSIS — I69354 Hemiplegia and hemiparesis following cerebral infarction affecting left non-dominant side: Secondary | ICD-10-CM

## 2022-12-20 DIAGNOSIS — R293 Abnormal posture: Secondary | ICD-10-CM | POA: Diagnosis not present

## 2022-12-20 DIAGNOSIS — R278 Other lack of coordination: Secondary | ICD-10-CM | POA: Diagnosis not present

## 2022-12-20 DIAGNOSIS — R2689 Other abnormalities of gait and mobility: Secondary | ICD-10-CM

## 2022-12-20 NOTE — Therapy (Signed)
OUTPATIENT PHYSICAL THERAPY TREATMENT NOTE   Patient Name: Angela Buchanan MRN: [id number] DOB:10/06/1950, 72 y.o., female Today's Date: 12/20/2022  PCP:  Sela Hilding, MD   REFERRING PROVIDER:  Sela Hilding, MD      END OF SESSION:   PT End of Session - 12/20/22 1527     Visit Number 16    Date for PT Re-Evaluation 01/19/23    Authorization Type 20 visitis 11/8-2/21/24    Authorization - Visit Number 15    Authorization - Number of Visits 36    Progress Note Due on Visit 20    PT Start Time [id number]    PT Stop Time 1528    PT Time Calculation (min) 43 min    Activity Tolerance Patient tolerated treatment well    Behavior During Therapy [facility] for tasks assessed/performed                         Past Medical History:  Diagnosis Date   Atypical chest pain 07/19/2021   Bigeminy    no current med.   Breast cancer ([place]) 06/2017   right   Bruises easily    CAD in native artery 10/31/2022   Carotid bruit 11/13/2019   Dental crowns present    History of diverticulitis    Hypertension    states under control with med., has been on med. x 5 yr.   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   Past Surgical History:  Procedure Laterality Date   ABDOMINAL HYSTERECTOMY     partial   BREAST LUMPECTOMY Right 06/22/2017   Malignant   BREAST LUMPECTOMY WITH RADIOACTIVE SEED AND SENTINEL LYMPH NODE BIOPSY Right 06/22/2017   Procedure: RIGHT BREAST LUMPECTOMY WITH RADIOACTIVE SEED AND RIGHT AXILLARY DEEP SENTINEL LYMPH NODE BIOPSY WITH BLUE DYE INJECTION;  Surgeon: Fanny Skates, MD;  Location: [place];  Service: General;  Laterality: Right;   BREAST LUMPECTOMY WITH RADIOACTIVE SEED LOCALIZATION  Left 06/22/2017   Procedure: LEFT BREAST LUMPECTOMY WITH RADIOACTIVE SEED LOCALIZATION;  Surgeon: Fanny Skates, MD;  Location: [place];  Service: General;  Laterality: Left;   CATARACT EXTRACTION W/ INTRAOCULAR LENS  IMPLANT, BILATERAL Bilateral    EXCISION OF BREAST BIOPSY Left 06/22/2017   benign   LOOP RECORDER INSERTION N/A 05/16/2022   Procedure: LOOP RECORDER INSERTION;  Surgeon: Vickie Epley, MD;  Location: [place] [facility];  Service: Cardiovascular;  Laterality: N/A;   PERCUTANEOUS PINNING Right 06/22/2022   Procedure: PERCUTANEOUS PINNING OF RIGHT HIP;  Surgeon: Hiram Gash, MD;  Location: [facility];  Service: Orthopedics;  Laterality: Right;   TONSILLECTOMY     age 15   Patient Active Problem List   Diagnosis Date Noted   Right spastic hemiplegia ([place]) 12/14/2022   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   CAD in native artery 10/31/2022   Malnutrition of moderate degree 06/22/2022   Closed fracture of femur, neck ([place]) 06/21/2022   Urinary frequency 06/21/2022   Hypokalemia 06/21/2022   Heart murmur 06/21/2022   Hypothyroidism 06/21/2022   Dysphagia 05/17/2022   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   Diplopia 09/17/2014   Dizziness and giddiness 09/17/2014   Essential hypertension 07/22/2014   Chest pain 07/22/2014   Family history of heart disease 07/22/2014    REFERRING  DIAG:  R29.6 (ICD-10-CM) - Repeated falls, closed fracture of Rt hip    THERAPY DIAG:  Hemiplegia and hemiparesis following cerebral infarction affecting left non-dominant side (HCC)  Other lack of coordination  Muscle weakness (generalized)  Other abnormalities of gait and mobility  Rationale for Evaluation and Treatment Rehabilitation  PERTINENT HISTORY: hypertension, breast cancer, hyperlipidemia, Lt hip fracture secondary to fall 06/2022, CVA 06/2022   PRECAUTIONS:  Fall, Rt breast cancer with node removal, osteoporosis    SUBJECTIVE:                                                                                                                                                                                      SUBJECTIVE STATEMENT:  I saw the doctor about my Rt arm.  They are going to do injections into my shoulder and botox into my hand.    PAIN:  Are you having pain? Not right now but my right hand and arm can be very sore at times.    OBJECTIVE: (objective measures completed at initial evaluation unless otherwise dated)  DIAGNOSTIC FINDINGS: Rt hip fracture and ORIF     COGNITION: Overall cognitive status: Within functional limits for tasks assessed                         SENSATION: WFL     POSTURE: rounded shoulders, forward head, flexed trunk , and weight shift left   PALPATION: NA   LOWER EXTREMITY ROM: Rt hip limited by 50%, hamstrings limited by 50% bil.    LOWER EXTREMITY MMT:   MMT Right eval Right  09/27/22 Left eval Right 10/30/22 Left 10/30/22  Hip flexion 3+ 4- 4- 4- 4-  Hip extension         Hip abduction 3-  4    Hip adduction         Hip internal rotation         Hip external rotation         Knee flexion 3- 4- 3+    Knee extension 3+  4 4- 4 painful hip 4-  Ankle dorsiflexion 3+ 4- 4- 4- 4  Ankle plantarflexion         Ankle inversion         Ankle eversion          (Blank rows = not tested)   FUNCTIONAL TESTS:   Eval: Timed  up and go (TUG): 1 min, 27 seconds   09/27/22: 5x sit to stand: 29 seconds with use of Lt hand  TUG: 27 seconds   10/30/22: 5x sit to stand: 23 sec use of LT hand     TUG: 26 sec with standard cane   11/20/22: 5x sit to stand: 18.68 seconds    TUG: 19.45 without device    3 minute walk test: 225 feet with a standard cane GAIT: Distance walked: 25 feet  Assistive device utilized: Hemi walker on Lt Level of assistance: SBA Comments: slow mobility, reduced trunk rotation, reduce Rt LE stance time and reduced Rt arm swing     TODAY'S TREATMENT:    12/20/22: Nustep L3 x 8 min with PT present to monitor and discuss progress - legs only  Sit to stand: x10 without UE support LAQ 2# Rt 2x10 Tandem stance: Rt and Lt- next to barre 3x10 seconds each Standing on level surface: holding blue weighted ball: D2 diagonals x10 each, standing on balance pad, same UE movement x10 Standing on balance pad: alternating arm motion to promote trunk rotation and weight shift Green loop around thighs: sidestepping at barre with mini squat and UE support 3 laps,  Standing marching 2x10 with 2# weight added-standing on balance pad 6" step ups on Lt x10, Rt x5 Gait with small base quad cane: around clinic x 5 minute   12/13/22: Nustep L3 x 6 min with PT present to monitor and discuss progress - legs only  Sit to stand: x10 without UE support LAQ 2# Rt 2x10 Tandem stance: Rt and Lt- next to barre 3x10 seconds each Standing marching 2x10 with 2# weight added-standing on balance pad Seated ball squeezes x20 6" step ups on Lt x10, Rt x5 Gait with small base quad cane: verbal cues for use of cane, improve step length  11/28/22: Nustep L3 x 6 min with PT present to monitor and discuss progress  Sit to stand: x10 without UE support LAQ 2# Rt 2x10, 3# Lt 2x10 with 5" hold Tandem stance: Rt and Lt- next to barre 3x10 seconds each Standing marching 2x10 with 2.5# weight added-standing on balance pad Stepping over  hurdles in // bars: forward and lateral leading with Rt and Lt with min UE support as needed Seated ball squeezes x20 6" step ups on Lt: forward and lateral 2x5 each with UE support on // bars  Gait with hemi walker: to CR gym and back    PATIENT EDUCATION:  Education details: Access Code: [email or web address] Person educated: Patient Education method: Explanation, Demonstration, and Handouts Education comprehension: verbalized understanding and returned demonstration   HOME EXERCISE PROGRAM: Access Code: [email or web address] URL: [email or web address] Date: 09/27/2022 Prepared by: Claiborne Billings  Exercises - Seated Long Arc Quad  - 3 x daily - 7 x weekly - 2 sets - 10 reps - 5 hold - Seated March   - 3 x daily - 7 x weekly - 3 sets - 10 reps - Seated Heel Toe Raises   - 3 x daily - 7 x weekly - 2 sets - 10 reps - Seated Isometric Hip Adduction with Ball  - 3  x daily - 7 x weekly - 2 sets - 10 reps - Sit to Stand with Armchair  - 2 x daily - 7 x weekly - 2 sets - 5-10 reps - Standing Hip Abduction with Counter Support  - 1 x daily - 7 x weekly - 2 sets - 10 reps - Heel Raises with Counter Support  - 2 x daily - 7 x weekly - 2 sets - 10 reps  Patient Education - Walking with a Single General Quirion - Modified 4 Point Gait Pattern  ASSESSMENT:   CLINICAL IMPRESSION:  Pt has been using her quad cane since last session and is doing well with it at home.  She is using the cane appropriately and with good step length.  Pt did advanced activity in the clinic to challenge balance and PT provided supervision and CGA for safety.  Pt reports improved endurance and confidence with balance tasks at home and in the community.  Patient will benefit from skilled PT to address the below impairments and improve overall function.   OBJECTIVE IMPAIRMENTS: Abnormal gait, decreased activity tolerance, decreased balance, decreased mobility, difficulty walking, decreased strength, decreased safety awareness, impaired perceived  functional ability, impaired flexibility, postural dysfunction, and pain.    ACTIVITY LIMITATIONS: carrying, lifting, sitting, standing, stairs, transfers, hygiene/grooming, and locomotion level   PARTICIPATION LIMITATIONS: meal prep, cleaning, laundry, driving, shopping, and community activity   PERSONAL FACTORS: Age, Past/current experiences, and 1-2 comorbidities: CVA, falls, Rt hip fracture with ORIF  are also affecting patient's functional outcome.    REHAB POTENTIAL: Good   CLINICAL DECISION MAKING: Evolving/moderate complexity   EVALUATION COMPLEXITY: Moderate     GOALS: Goals reviewed with patient? Yes   SHORT TERM GOALS: Target date: 09/06/2022   Be independent in initial HEP Baseline: Goal status: Goal met 08/14/22   2.  Improve LE strength to perform sit to stand with moderate Lt UE support Baseline:  Goal status: Goal met 08/28/22  3.  Perform TUG in < or = to 60 seconds to reduce falls risk Baseline: 27 seconds (09/27/22) Goal status: MET      LONG TERM GOALS: Target date:  01/19/23   Be independent in advanced HEP Baseline: independent in current HEP and further progress is needed  Goal status: in progress    2.  Perform TUG in < or = to 15 seconds to reduce falls risk Baseline: 19.45 seconds (11/20/22) Goal status: In progress    3.  ambulate > or = to 350 feet in 3 minutes to improve community ambulation and independence  Baseline: 225 (11/20/22) Goal status: NEW    4.  Ambulate with hemi walker with supervision or no guard due to improved gait Baseline: able to do this Goal status: MET   5.  perform 5x sit to stand in < or = to 14 seconds to reduce falls risk  Baseline: 18.68 seconds  (11/20/22)  Goal Status: NEW  6. Ambulate with cane for all distances and demonstrate independence due to improved stability  Baseline: using hemiwalker and will get cane soon.  Walked well with standard cane in the clinic  (11/20/22)  Goal status: INITIAL      PLAN:   PT FREQUENCY: 1-2x/week   PT DURATION: 8 weeks   PLANNED INTERVENTIONS: Therapeutic exercises, Therapeutic activity, Neuromuscular re-education, Balance training, Gait training, Patient/Family education, Self Care, Joint mobilization, Stair training, Dry Needling, Electrical stimulation, Cryotherapy, Moist heat, Taping, Manual therapy, and Re-evaluation   PLAN FOR NEXT SESSION: Continue  to work on gait , strength and balance.  Sigurd Sos, PT 12/20/22 3:29 PM

## 2022-12-21 ENCOUNTER — Ambulatory Visit: Payer: Medicare PPO | Admitting: Occupational Therapy

## 2022-12-21 DIAGNOSIS — M79601 Pain in right arm: Secondary | ICD-10-CM | POA: Diagnosis not present

## 2022-12-21 DIAGNOSIS — M6281 Muscle weakness (generalized): Secondary | ICD-10-CM

## 2022-12-21 DIAGNOSIS — I69354 Hemiplegia and hemiparesis following cerebral infarction affecting left non-dominant side: Secondary | ICD-10-CM

## 2022-12-21 DIAGNOSIS — R278 Other lack of coordination: Secondary | ICD-10-CM

## 2022-12-21 NOTE — Therapy (Signed)
OUTPATIENT OCCUPATIONAL THERAPY  Treatment Session & Progress Note  Patient Name: Angela Buchanan MRN: [id number] DOB:11/17/1950, 72 y.o., female Today's Date: 12/21/2022  PCP: Glenis Smoker, MD REFERRING PROVIDER: Glenis Smoker, MD       END OF SESSION:  OT End of Session - 12/21/22 1322     Visit Number 21    Number of Visits 29    Date for OT Re-Evaluation 12/29/22    Authorization Type [employer]    OT Start Time 1319    OT Stop Time 1400    OT Time Calculation (min) 41 min                               Past Medical History:  Diagnosis Date   Atypical chest pain 07/19/2021   Bigeminy    no current med.   Breast cancer ([place]) 06/2017   right   Bruises easily    CAD in native artery 10/31/2022   Carotid bruit 11/13/2019   Dental crowns present    History of diverticulitis    Hypertension    states under control with med., has been on med. x 5 yr.   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   Past Surgical History:  Procedure Laterality Date   ABDOMINAL HYSTERECTOMY     partial   BREAST LUMPECTOMY Right 06/22/2017   Malignant   BREAST LUMPECTOMY WITH RADIOACTIVE SEED AND SENTINEL LYMPH NODE BIOPSY Right 06/22/2017   Procedure: RIGHT BREAST LUMPECTOMY WITH RADIOACTIVE SEED AND RIGHT AXILLARY DEEP SENTINEL LYMPH NODE BIOPSY WITH BLUE DYE INJECTION;  Surgeon: Fanny Skates, MD;  Location: [facility];  Service: General;  Laterality: Right;   BREAST LUMPECTOMY WITH RADIOACTIVE SEED LOCALIZATION Left 06/22/2017   Procedure: LEFT BREAST LUMPECTOMY WITH RADIOACTIVE SEED LOCALIZATION;  Surgeon: Fanny Skates, MD;  Location: [place];  Service:  General;  Laterality: Left;   CATARACT EXTRACTION W/ INTRAOCULAR LENS  IMPLANT, BILATERAL Bilateral    EXCISION OF BREAST BIOPSY Left 06/22/2017   benign   LOOP RECORDER INSERTION N/A 05/16/2022   Procedure: LOOP RECORDER INSERTION;  Surgeon: Vickie Epley, MD;  Location: [facility];  Service: Cardiovascular;  Laterality: N/A;   PERCUTANEOUS PINNING Right 06/22/2022   Procedure: PERCUTANEOUS PINNING OF RIGHT HIP;  Surgeon: Hiram Gash, MD;  Location: [facility];  Service: Orthopedics;  Laterality: Right;   TONSILLECTOMY     age 62   Patient Active Problem List   Diagnosis Date Noted   Right spastic hemiplegia ([place]) 12/14/2022   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   CAD in native artery 10/31/2022   Malnutrition of moderate degree 06/22/2022   Closed fracture of femur, neck ([place]) 06/21/2022   Urinary frequency 06/21/2022   Hypokalemia 06/21/2022   Heart murmur 06/21/2022   Hypothyroidism 06/21/2022   Dysphagia 05/17/2022   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   Diplopia 09/17/2014   Dizziness and giddiness 09/17/2014   Essential hypertension 07/22/2014   Chest pain 07/22/2014   Family history of heart disease 07/22/2014    ONSET DATE: CVA 05/12/22  REFERRING DIAG: M79.641 (ICD-10-CM) - Pain in right hand  THERAPY DIAG:  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   Pt accompanied by: self and significant other (Husband, [name])  PERTINENT HISTORY: hypertension, breast cancer, hyperlipidemia, Lt hip fracture secondary to fall 06/2022, CVA 06/2022   PRECAUTIONS: Fall, Rt breast cancer with node removal, osteoporosis  WEIGHT BEARING RESTRICTIONS: No  PAIN:  Are you having pain? Yes: NPRS scale: 1-2/10 Pain location: R shoulder Pain description: aching Aggravating factors: increased engagement in exercises/HEP Relieving factors: rest  FALLS: Has patient fallen in last 6 months? Yes. Number of falls 1 fall, one week after d/c home from SNF  LIVING ENVIRONMENT: Lives with: lives with their spouse Lives in: House/apartment Stairs: Yes: External: 2 steps; on left going up Has following equipment at home: Gilford Rile - 2 wheeled, Hemi walker, Wheelchair (manual), shower chair, and Grab bars  PLOF: Independent and Independent with basic ADLs  PATIENT GOALS: to get use of my R arm again  OBJECTIVE:   HAND DOMINANCE: Right  ADLs: Transfers/ambulation related to ADLs: Mod I with hemi-walker Eating: using L hand for eating, husband is assisting with cutting up food Grooming: Mod I, using L hand UB Dressing: Mod assist LB Dressing: Max assist Toileting: Mod I Bathing: Supervision - Mod I Tub Shower transfers: Dentist: Shower seat with back and Grab bars  UPPER EXTREMITY ROM:    Active ROM Right eval Left eval Right 10/18/22  Right 11/15/22 Right 12/18/22  Shoulder flexion 54   82 86  Shoulder abduction       Shoulder adduction       Shoulder extension       Shoulder internal rotation       Shoulder external rotation       Elbow flexion 108   132 136  Elbow  extension -24   -12 -12  Wrist flexion    38 36  Wrist extension    18 24  Wrist ulnar deviation       Wrist radial deviation       Wrist pronation       Wrist supination       (Blank rows = not tested)  Passive ROM Right eval Left eval Right 10/18/22 Right 11/15/22 Right 12/18/22  Shoulder flexion 65  76 86 94  Shoulder abduction       Shoulder adduction       Shoulder extension       Shoulder internal rotation       Shoulder external rotation       Elbow flexion       Elbow extension -17  -15 -10 0  Wrist flexion 46  56 60 60  Wrist extension 27  50 40 56  Wrist ulnar deviation       Wrist radial deviation  Wrist pronation       Wrist supination       (Blank rows = not tested)  Passive ROM Right 10/18/22 Right 11/15/22 Right 12/18/22  Thumb MCP (0-60)     Thumb IP (0-80)     Thumb Radial abd/add (0-55)     Thumb Palmar abd/add (0-45)     Thumb Opposition to Small Finger     Index MCP (0-90)  0   Index PIP (0-100)  -5   Index DIP (0-70)   -3   Long MCP (0-90)      Long PIP (0-100) -30  -30 -30  Long DIP (0-70) -20 -20 -5  Ring MCP (0-90)      Ring PIP (0-100) -25  -21 -30  Ring DIP (0-70) -15  -8 -7  Little MCP (0-90)      Little PIP (0-100)   -18   Little DIP (0-70)   -4   (Blank rows = not tested)   UPPER EXTREMITY MMT:   not assessed due to pain with any PROM, AROM    HAND FUNCTION: Loose gross grasp  R gross grasp: 5#; L gross grasp: 34#  COORDINATION: Box and Blocks:  Right 2blocks, Left 45 blocks. Modified to complete with RUE.  Pt able to grasp and move across to other side of box with barrier removed.   11/01/22: RUE 13 blocks 12/18/22: RUE 19 blocks  SENSATION: Mild decrease in R  EDEMA: mild edema in wrist hand, and fingers on RUE  OBSERVATIONS: Pt guarded with movement, hypersensitive to touch and PROM.     TODAY'S TREATMENT:                                   12/21/22 Functional reach: engaged in reaching with RUE outside BOS to  pick up rings and then cross midline to place on vertical surface.  OT providing min cues and facilitation of task to increase shoulder flexion and abduction when reaching outside BOS and then crossing midline.  OT providing cues to exaggerate opening of hand when releasing ring.  Coordination/functional reach: engaged in picking up Connect 4 pieces with focus on incorporating index finger in to pinch, sustained pinch during functional reach, and shoulder flexion to reach up to place into Connect 4 grid.  Pt initially with exclusion of index finger requiring min cues to visually attend to hand to increase precision pinch and rotation of pieces finger tips.  Increased challenge by moving target to further facilitate PNF D1 diagonal with crossing midline.  IADL: reviewed recommendations to incorporate functional movements from session into homemaking tasks. Provided recommendations for placement of items to further facilitate abduction and PNF diagonals (ie. Laundry folding and sorting).     12/18/22 Box and blocks: RUE: 19 blocks  Pt demonstrating decreased shoulder hike and improved grasp and release this session, still demonstrating decreased finger extension with release of blocks. Measurements taken (see above) with pt demonstrating improvements in R shoulder and elbow mobility, still demonstrating decreased extension of all digits, especially index and long finger.  Self-feeding: Pt utilizing built up handle and gross grasp with simulated self-feeding with scooping beans from bowl.  Pt benefiting from built up handle to maintain sustained, loose grasp to engage in self-feeding.  OT educated on finger feeding to challenge various pinches, pinch strength, and shoulder ROM.  Pt able to touch to chin in upright position, encouraged attempts at self-feeding  before next session. UE ROM: OT provided education on Hooklying Single Arm Chest Press with focus on motor control and not hiking shoulder during  movement.  Pt is demonstrating increased elbow extension even in sitting this session.   12/13/22 Supine exercises: shoulder abduction, elbow flexion/extension with reaching to touch chin, and external/internal rotation in abduction.  Pt demonstrating difficulty maintaining arm in abducted position during external/internal rotation.  Discussed purpose of positioning and functional carryover of each movement. UE ROM: facilitated increased shoulder ROM, elbow extension, supination/pronation, and grip with picking up bean bags initially with hand and then transitioning to scooping up and flipping with spatula.  OT providing tactile cues at shoulder to draw attention to and decrease shoulder hike during shoulder flexion/functional reach.  Increased challenge to include picking up rings and placing on vertical dowel.  Pt requiring assist to pick up initially but then able to pick up from slightly elevated surface to allow pt to get fingers underneath ring.  Pt then placing and removing from dowel with initial tactile cues fading to supervision only with improved awareness of shoulder positioning and very minimal shoulder hike.   Box and blocks: R: 11 blocks.  Pt reports arm is tired as completing at end of session.  PATIENT EDUCATION: Education details: ongoing condition specific education and progressive exercises. Person educated: Patient and Spouse Education method: Explanation, Demonstration, Tactile cues, Verbal cues, and Handouts Education comprehension: verbalized understanding and needs further education  HOME EXERCISE PROGRAM: Access Code: [email or web address] URL: [email or web address] Date: 12/06/2022 Prepared by: [facility]  Exercises - Supine Shoulder Flexion AAROM with Dowel  - 1 x daily - 3 x weekly - 2 sets - 10 reps - Supine Shoulder Protraction with Dowel  - 1 x daily - 3 x weekly - 2 sets - 10 reps - Supine Shoulder Abduction AAROM with  Dowel  - 1 x daily - 3 x weekly - 2 sets - 10 reps - Touch finger to nose  - 1 x daily - 3 x weekly - 2 sets - 10 reps - 5-10 sec hold - Touch finger to nose  - 1 x daily - 3 x weekly - 2 sets - 10 reps - Hooklying Single Arm Chest Press  - 1 x daily - 3 x weekly - 2 sets - 10 reps - Thumb AROM Opposition  - 1 x daily - 7 x weekly - 1 sets - 5 reps - Seated Finger Composite Flexion Stretch  - 1 x daily - 7 x weekly - 1 sets - 5 reps - Hand PROM Finger Extension  - 1 x daily - 7 x weekly - 1 sets - 10 reps - Seated Forearm Pronation Supination AROM  - 1 x daily - 7 x weekly - 1 sets - 10 reps - Seated Wrist Flexion Extension PROM  - 1 x daily - 7 x weekly - 1 sets - 10 reps   GOALS: Goals reviewed with patient? Yes  SHORT TERM GOALS: Target date: 12/08/22  Pt and spouse will be independent with updated ROM and coordination HEP. Baseline: Goal status: MET - 12/06/22  2.  Pt will verbalize understanding of task modifications and/or potential AE needs to increase ease, safety, and independence w/ ADLs. Baseline:  Goal status: MET - 12/06/22     LONG TERM GOALS: Target date: 12/29/22  1.  Pt will demonstrate ability to demonstrate improved functional grasp in RUE to open new jar, increased handwriting, and  self-feeding. Baseline:  Goal status: IN PROGRESS  2.  Pt will demonstrate improved UE functional use for ADLs as evidenced by increasing box/ blocks score by 5 blocks with RUE, to be completed in standardized manner. Baseline:  13 blocks with RUE over barrier on 11/01/22 Goal status: Met - 19 blocks on 12/18/22  3.  Pt will demonstrate improved shoulder ROM to allow for reach to moderate height as needed for ADLs/IADLs (washing back, putting on jacket, etc).  Baseline:  Goal status: IN PROGRESS  4. Pt will demonstrate improved extension in long and ring finger to allow for more functional hand opening.  Baseline:  Goal status: IN PROGRESS  5.  Pt will report decreased pain in  dominant RUE interfering with ADLs/IADLs by decreased impairment score on Quick DASH. Baseline: 77% Goal status: IN PROGRESS  6.  Pt will be independent with progressive splint wear and care PRN Baseline: modifications to splint TBD Goal status: IN PROGRESS  ASSESSMENT:  CLINICAL IMPRESSION: Pt demonstrating good carryover and recall of awareness of shoulder positioning to decrease shoulder hike during functional reach and exercises. Pt is demonstrating mild improvements in shoulder and elbow ROM with incorporation of PNF D1 patterns and functional reach.  Pt continues to demonstrate flexion contractures in long and ring finger, with difficulty with extension however demonstrating improved functional use of hand during grasp/release. Pt continues to benefit from encouragement to increase functional use of RUE into self-care tasks.    PERFORMANCE DEFICITS: in functional skills including ADLs, IADLs, coordination, sensation, edema, tone, ROM, strength, pain, flexibility, Fine motor control, Gross motor control, balance, body mechanics, endurance, decreased knowledge of use of DME, skin integrity, vision, and UE functional use, cognitive skills including emotional and safety awareness, and psychosocial skills including coping strategies.   IMPAIRMENTS: are limiting patient from ADLs, IADLs, and rest and sleep.   CO-MORBIDITIES: may have co-morbidities  that affects occupational performance. Patient will benefit from skilled OT to address above impairments and improve overall function.  MODIFICATION OR ASSISTANCE TO COMPLETE EVALUATION: Min-Moderate modification of tasks or assist with assess necessary to complete an evaluation.  OT OCCUPATIONAL PROFILE AND HISTORY: Detailed assessment: Review of records and additional review of physical, cognitive, psychosocial history related to current functional performance.  CLINICAL DECISION MAKING: Moderate - several treatment options, min-mod task  modification necessary  REHAB POTENTIAL: Good  EVALUATION COMPLEXITY: Moderate    PLAN:  OT FREQUENCY: 2x/week  OT DURATION: 8 weeks  PLANNED INTERVENTIONS: self care/ADL training, therapeutic exercise, therapeutic activity, neuromuscular re-education, manual therapy, passive range of motion, balance training, functional mobility training, splinting, paraffin, fluidotherapy, biofeedback, compression bandaging, moist heat, cryotherapy, contrast bath, patient/family education, visual/perceptual remediation/compensation, psychosocial skills training, coping strategies training, and DME and/or AE instructions  RECOMMENDED OTHER SERVICES: NA  CONSULTED AND AGREED WITH PLAN OF CARE: Patient and family member/caregiver  PLAN FOR NEXT SESSION: Review splint fit and make modification as needed.  AAROM or self-ROM to include shoulder flexion/extension and internal/external rotation, elbow, forearm.  Functional grasp and release.  Attempt handwriting and/or self-feeding.   Simonne Come, OTR/L 12/21/2022, 1:22 PM

## 2022-12-21 NOTE — Progress Notes (Signed)
[facility] Summary Report / Loop Recorder 

## 2022-12-25 ENCOUNTER — Ambulatory Visit (INDEPENDENT_AMBULATORY_CARE_PROVIDER_SITE_OTHER): Payer: Medicare PPO

## 2022-12-25 DIAGNOSIS — I639 Cerebral infarction, unspecified: Secondary | ICD-10-CM

## 2022-12-25 LAB — CUP PACEART REMOTE DEVICE CHECK
Date Time Interrogation Session: 20240325034700
Implantable Pulse Generator Implant Date: 20230815
Pulse Gen Serial Number: 183424

## 2022-12-26 ENCOUNTER — Ambulatory Visit: Payer: Medicare PPO

## 2022-12-26 DIAGNOSIS — I69354 Hemiplegia and hemiparesis following cerebral infarction affecting left non-dominant side: Secondary | ICD-10-CM

## 2022-12-26 DIAGNOSIS — R278 Other lack of coordination: Secondary | ICD-10-CM

## 2022-12-26 DIAGNOSIS — R2689 Other abnormalities of gait and mobility: Secondary | ICD-10-CM

## 2022-12-26 DIAGNOSIS — R293 Abnormal posture: Secondary | ICD-10-CM | POA: Diagnosis not present

## 2022-12-26 DIAGNOSIS — M6281 Muscle weakness (generalized): Secondary | ICD-10-CM | POA: Diagnosis not present

## 2022-12-26 NOTE — Therapy (Signed)
OUTPATIENT PHYSICAL THERAPY TREATMENT NOTE   Patient Name: Angela Buchanan MRN: [id number] DOB:Dec 28, 1950, 72 y.o., female Today's Date: 12/26/2022  PCP:  Sela Hilding, MD   REFERRING PROVIDER:  Sela Hilding, MD      END OF SESSION:   PT End of Session - 12/26/22 1147     Visit Number 17    Date for PT Re-Evaluation 01/19/23    Authorization Type 36 visit total (PT)    Authorization Time Period 11/8-4/18/24    Authorization - Visit Number 16    Authorization - Number of Visits 36    Progress Note Due on Visit 20    PT Start Time 1140    PT Stop Time 1228    PT Time Calculation (min) 48 min    Activity Tolerance Patient tolerated treatment well    Behavior During Therapy [facility] for tasks assessed/performed                          Past Medical History:  Diagnosis Date   Atypical chest pain 07/19/2021   Bigeminy    no current med.   Breast cancer ([place]) 06/2017   right   Bruises easily    CAD in native artery 10/31/2022   Carotid bruit 11/13/2019   Dental crowns present    History of diverticulitis    Hypertension    states under control with med., has been on med. x 5 yr.   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   Past Surgical History:  Procedure Laterality Date   ABDOMINAL HYSTERECTOMY     partial   BREAST LUMPECTOMY Right 06/22/2017   Malignant   BREAST LUMPECTOMY WITH RADIOACTIVE SEED AND SENTINEL LYMPH NODE BIOPSY Right 06/22/2017   Procedure: RIGHT BREAST LUMPECTOMY WITH RADIOACTIVE SEED AND RIGHT AXILLARY DEEP SENTINEL LYMPH NODE BIOPSY WITH BLUE DYE INJECTION;  Surgeon: Fanny Skates, MD;  Location: [facility];  Service: General;  Laterality: Right;   BREAST  LUMPECTOMY WITH RADIOACTIVE SEED LOCALIZATION Left 06/22/2017   Procedure: LEFT BREAST LUMPECTOMY WITH RADIOACTIVE SEED LOCALIZATION;  Surgeon: Fanny Skates, MD;  Location: [facility];  Service: General;  Laterality: Left;   CATARACT EXTRACTION W/ INTRAOCULAR LENS  IMPLANT, BILATERAL Bilateral    EXCISION OF BREAST BIOPSY Left 06/22/2017   benign   LOOP RECORDER INSERTION N/A 05/16/2022   Procedure: LOOP RECORDER INSERTION;  Surgeon: Vickie Epley, MD;  Location: [facility];  Service: Cardiovascular;  Laterality: N/A;   PERCUTANEOUS PINNING Right 06/22/2022   Procedure: PERCUTANEOUS PINNING OF RIGHT HIP;  Surgeon: Hiram Gash, MD;  Location: [facility];  Service: Orthopedics;  Laterality: Right;   TONSILLECTOMY     age 73   Patient Active Problem List   Diagnosis Date Noted   Right spastic hemiplegia ([place]) 12/14/2022   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   CAD in native artery 10/31/2022   Malnutrition of moderate degree 06/22/2022   Closed fracture of femur, neck ([place]) 06/21/2022   Urinary frequency 06/21/2022   Hypokalemia 06/21/2022   Heart murmur 06/21/2022   Hypothyroidism 06/21/2022   Dysphagia 05/17/2022   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   CVA (cerebral vascular accident) ([place]) 05/12/2022   Osteoporosis 01/24/2022  Atypical chest pain 07/19/2021   Temporomandibular joint (TMJ) pain 07/16/2020   Referred otalgia of both ears 07/16/2020   PAC (premature atrial contraction) 05/11/2020   PVC (premature ventricular contraction) 05/11/2020   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   Carotid bruit 11/13/2019   Pure hypercholesterolemia 11/13/2019   Malignant neoplasm of upper-outer quadrant of right breast in female, estrogen receptor positive ([place]) 05/22/2017   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   Diplopia 09/17/2014   Dizziness and giddiness 09/17/2014   Essential hypertension 07/22/2014   Chest pain 07/22/2014   Family history  of heart disease 07/22/2014    REFERRING DIAG:  R29.6 (ICD-10-CM) - Repeated falls, closed fracture of Rt hip    THERAPY DIAG:  Hemiplegia and hemiparesis following cerebral infarction affecting left non-dominant side (HCC)  Other lack of coordination  Muscle weakness (generalized)  Other abnormalities of gait and mobility  Rationale for Evaluation and Treatment Rehabilitation  PERTINENT HISTORY: hypertension, breast cancer, hyperlipidemia, Lt hip fracture secondary to fall 06/2022, CVA 06/2022   PRECAUTIONS:  Fall, Rt breast cancer with node removal, osteoporosis    SUBJECTIVE:                                                                                                                                                                                      SUBJECTIVE STATEMENT:  I'm using my cane and doing very well with it.     PAIN:  Are you having pain? Not right now but my right hand and arm can be very sore at times.    OBJECTIVE: (objective measures completed at initial evaluation unless otherwise dated)  DIAGNOSTIC FINDINGS: Rt hip fracture and ORIF     COGNITION: Overall cognitive status: Within functional limits for tasks assessed                         SENSATION: WFL     POSTURE: rounded shoulders, forward head, flexed trunk , and weight shift left   PALPATION: NA   LOWER EXTREMITY ROM: Rt hip limited by 50%, hamstrings limited by 50% bil.    LOWER EXTREMITY MMT:   MMT Right eval Right  09/27/22 Left eval Right 10/30/22 Left 10/30/22  Hip flexion 3+ 4- 4- 4- 4-  Hip extension         Hip abduction 3-  4    Hip adduction         Hip internal rotation         Hip external rotation         Knee flexion 3- 4- 3+    Knee extension 3+ 4 4- 4 painful hip  4-  Ankle dorsiflexion 3+ 4- 4- 4- 4  Ankle plantarflexion         Ankle inversion         Ankle eversion          (Blank rows = not tested)   FUNCTIONAL TESTS:   Eval: Timed up and go (TUG):  1 min, 27 seconds   09/27/22: 5x sit to stand: 29 seconds with use of Lt hand  TUG: 27 seconds   10/30/22: 5x sit to stand: 23 sec use of LT hand     TUG: 26 sec with standard cane   11/20/22: 5x sit to stand: 18.68 seconds    TUG: 19.45 without device    3 minute walk test: 225 feet with a standard cane GAIT: Distance walked: 25 feet  Assistive device utilized: Hemi walker on Lt Level of assistance: SBA Comments: slow mobility, reduced trunk rotation, reduce Rt LE stance time and reduced Rt arm swing     TODAY'S TREATMENT:    12/26/22: Nustep L3 x 8 min with PT present to monitor and discuss progress - legs only  Sit to stand: x10 without UE support LAQ 2# Rt 2x10 Tandem stance: Rt and Lt- next to barre 3x10 seconds each Standing on level surface: holding blue weighted ball: D2 diagonals x10 each, standing on balance pad, same UE movement x10 Standing on balance pad: alternating arm motion to promote trunk rotation and weight shift Green loop around thighs: sidestepping at barre with mini squat and UE support 3 laps, Standing marching 2x10 with 2# weight added-standing on balance pad Stepping over hurdles in // bars with Lt UE support only: forward and lateral, 4 laps each Gait with cane- good step symmetry and use of cane  12/20/22: Nustep L3 x 8 min with PT present to monitor and discuss progress - legs only  Sit to stand: x10 without UE support LAQ 2# Rt 2x10 Tandem stance: Rt and Lt- next to barre 3x10 seconds each Standing on level surface: holding blue weighted ball: D2 diagonals x10 each, standing on balance pad, same UE movement x10 Standing on balance pad: alternating arm motion to promote trunk rotation and weight shift Green loop around thighs: sidestepping at barre with mini squat and UE support 3 laps,  Standing marching 2x10 with 2# weight added-standing on balance pad 6" step ups on Lt x10, Rt x5 Gait with small base quad cane: around clinic x 5 minute    12/13/22: Nustep L3 x 6 min with PT present to monitor and discuss progress - legs only  Sit to stand: x10 without UE support LAQ 2# Rt 2x10 Tandem stance: Rt and Lt- next to barre 3x10 seconds each Standing marching 2x10 with 2# weight added-standing on balance pad Seated ball squeezes x20 6" step ups on Lt x10, Rt x5 Gait with small base quad cane: verbal cues for use of cane, improve step length   PATIENT EDUCATION:  Education details: Access Code: [email or web address] Person educated: Patient Education method: Explanation, Media planner, and Handouts Education comprehension: verbalized understanding and returned demonstration   HOME EXERCISE PROGRAM: Access Code: [email or web address] URL: [email or web address] Date: 09/27/2022 Prepared by: Claiborne Billings  Exercises - Seated Long Arc Quad  - 3 x daily - 7 x weekly - 2 sets - 10 reps - 5 hold - Seated March   - 3 x daily - 7 x weekly - 3 sets - 10 reps - Seated Heel Toe Raises   - 3 x daily - 7 x  weekly - 2 sets - 10 reps - Seated Isometric Hip Adduction with Ball  - 3 x daily - 7 x weekly - 2 sets - 10 reps - Sit to Stand with Armchair  - 2 x daily - 7 x weekly - 2 sets - 5-10 reps - Standing Hip Abduction with Counter Support  - 1 x daily - 7 x weekly - 2 sets - 10 reps - Heel Raises with Counter Support  - 2 x daily - 7 x weekly - 2 sets - 10 reps  Patient Education - Walking with a Single General [place] - Modified 4 Point Gait Pattern  ASSESSMENT:   CLINICAL IMPRESSION:  Pt has been using her quad cane since last session and is doing well with it at home.  She is using the cane appropriately and is more comfortable with this.  Pt did advanced activity in the clinic to challenge balance over the past 2 weeks and PT provided supervision and CGA for safety.  She is able to do standing balance tasks with only Lt UE support. Pt reports improved endurance and confidence with balance tasks at home and in the community.  Patient will benefit from  skilled PT to address the below impairments and improve overall function.   OBJECTIVE IMPAIRMENTS: Abnormal gait, decreased activity tolerance, decreased balance, decreased mobility, difficulty walking, decreased strength, decreased safety awareness, impaired perceived functional ability, impaired flexibility, postural dysfunction, and pain.    ACTIVITY LIMITATIONS: carrying, lifting, sitting, standing, stairs, transfers, hygiene/grooming, and locomotion level   PARTICIPATION LIMITATIONS: meal prep, cleaning, laundry, driving, shopping, and community activity   PERSONAL FACTORS: Age, Past/current experiences, and 1-2 comorbidities: CVA, falls, Rt hip fracture with ORIF  are also affecting patient's functional outcome.    REHAB POTENTIAL: Good   CLINICAL DECISION MAKING: Evolving/moderate complexity   EVALUATION COMPLEXITY: Moderate     GOALS: Goals reviewed with patient? Yes   SHORT TERM GOALS: Target date: 09/06/2022   Be independent in initial HEP Baseline: Goal status: Goal met 08/14/22   2.  Improve LE strength to perform sit to stand with moderate Lt UE support Baseline:  Goal status: Goal met 08/28/22  3.  Perform TUG in < or = to 60 seconds to reduce falls risk Baseline: 27 seconds (09/27/22) Goal status: MET      LONG TERM GOALS: Target date:  01/19/23   Be independent in advanced HEP Baseline: independent in current HEP and further progress is needed  Goal status: in progress    2.  Perform TUG in < or = to 15 seconds to reduce falls risk Baseline: 19.45 seconds (11/20/22) Goal status: In progress    3.  ambulate > or = to 350 feet in 3 minutes to improve community ambulation and independence  Baseline: 225 (11/20/22) Goal status: NEW    4.  Ambulate with hemi walker with supervision or no guard due to improved gait Baseline: able to do this Goal status: MET   5.  perform 5x sit to stand in < or = to 14 seconds to reduce falls risk  Baseline: 18.68 seconds   (11/20/22)  Goal Status: NEW  6. Ambulate with cane for all distances and demonstrate independence due to improved stability  Baseline: using hemiwalker and will get cane soon.  Walked well with standard cane in the clinic  (11/20/22)  Goal status: INITIAL     PLAN:   PT FREQUENCY: 1-2x/week   PT DURATION: 8 weeks   PLANNED INTERVENTIONS: Therapeutic  exercises, Therapeutic activity, Neuromuscular re-education, Balance training, Gait training, Patient/Family education, Self Care, Joint mobilization, Stair training, Dry Needling, Electrical stimulation, Cryotherapy, Moist heat, Taping, Manual therapy, and Re-evaluation   PLAN FOR NEXT SESSION: Continue to work on gait , strength and balance.  Sigurd Sos, PT 12/26/22 12:33 PM

## 2022-12-27 ENCOUNTER — Ambulatory Visit: Payer: Medicare PPO | Admitting: Occupational Therapy

## 2022-12-27 DIAGNOSIS — M6281 Muscle weakness (generalized): Secondary | ICD-10-CM | POA: Diagnosis not present

## 2022-12-27 DIAGNOSIS — R278 Other lack of coordination: Secondary | ICD-10-CM | POA: Diagnosis not present

## 2022-12-27 DIAGNOSIS — I69354 Hemiplegia and hemiparesis following cerebral infarction affecting left non-dominant side: Secondary | ICD-10-CM | POA: Diagnosis not present

## 2022-12-27 DIAGNOSIS — M79601 Pain in right arm: Secondary | ICD-10-CM | POA: Diagnosis not present

## 2022-12-27 NOTE — Therapy (Unsigned)
OUTPATIENT OCCUPATIONAL THERAPY  Treatment Session   Patient Name: Angela Buchanan MRN: [id number] DOB:03/07/1951, 72 y.o., female 56 Date: 12/28/2022  PCP: Glenis Smoker, MD REFERRING PROVIDER: Glenis Smoker, MD       END OF SESSION:  OT End of Session - 12/27/22 1442     Visit Number 22    Number of Visits 29    Date for OT Re-Evaluation 12/29/22    Authorization Type [employer]    OT Start Time 1448    OT Stop Time 1544    OT Time Calculation (min) 56 min                                Past Medical History:  Diagnosis Date   Atypical chest pain 07/19/2021   Bigeminy    no current med.   Breast cancer ([place]) 06/2017   right   Bruises easily    CAD in native artery 10/31/2022   Carotid bruit 11/13/2019   Dental crowns present    History of diverticulitis    Hypertension    states under control with med., has been on med. x 5 yr.   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   Past Surgical History:  Procedure Laterality Date   ABDOMINAL HYSTERECTOMY     partial   BREAST LUMPECTOMY Right 06/22/2017   Malignant   BREAST LUMPECTOMY WITH RADIOACTIVE SEED AND SENTINEL LYMPH NODE BIOPSY Right 06/22/2017   Procedure: RIGHT BREAST LUMPECTOMY WITH RADIOACTIVE SEED AND RIGHT AXILLARY DEEP SENTINEL LYMPH NODE BIOPSY WITH BLUE DYE INJECTION;  Surgeon: Fanny Skates, MD;  Location: [facility];  Service: General;  Laterality: Right;   BREAST LUMPECTOMY WITH RADIOACTIVE SEED LOCALIZATION Left 06/22/2017   Procedure: LEFT BREAST LUMPECTOMY WITH RADIOACTIVE SEED LOCALIZATION;  Surgeon: Fanny Skates, MD;  Location: [facility];  Service: General;   Laterality: Left;   CATARACT EXTRACTION W/ INTRAOCULAR LENS  IMPLANT, BILATERAL Bilateral    EXCISION OF BREAST BIOPSY Left 06/22/2017   benign   LOOP RECORDER INSERTION N/A 05/16/2022   Procedure: LOOP RECORDER INSERTION;  Surgeon: Vickie Epley, MD;  Location: [facility];  Service: Cardiovascular;  Laterality: N/A;   PERCUTANEOUS PINNING Right 06/22/2022   Procedure: PERCUTANEOUS PINNING OF RIGHT HIP;  Surgeon: Hiram Gash, MD;  Location: [facility];  Service: Orthopedics;  Laterality: Right;   TONSILLECTOMY     age 60   Patient Active Problem List   Diagnosis Date Noted   Right spastic hemiplegia ([place]) 12/14/2022   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   CAD in native artery 10/31/2022   Malnutrition of moderate degree 06/22/2022   Closed fracture of femur, neck ([place]) 06/21/2022   Urinary frequency 06/21/2022   Hypokalemia 06/21/2022   Heart murmur 06/21/2022   Hypothyroidism 06/21/2022   Dysphagia 05/17/2022   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   CVA (cerebral vascular accident) ([place]) 05/12/2022   Osteoporosis 01/24/2022   Atypical chest pain 07/19/2021   Temporomandibular joint (TMJ) pain 07/16/2020   Referred otalgia of both ears 07/16/2020   PAC (premature atrial contraction) 05/11/2020   PVC (premature ventricular contraction) 05/11/2020   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  Carotid bruit 11/13/2019   Pure hypercholesterolemia 11/13/2019   Malignant neoplasm of upper-outer quadrant of right breast in female, estrogen receptor positive ([place]) 05/22/2017   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   Diplopia 09/17/2014   Dizziness and giddiness 09/17/2014   Essential hypertension 07/22/2014   Chest pain 07/22/2014   Family history of heart disease 07/22/2014    ONSET DATE: CVA 05/12/22  REFERRING DIAG: M79.641 (ICD-10-CM) - Pain in right hand  THERAPY DIAG:  Hemiplegia and hemiparesis following cerebral infarction affecting left non-dominant  side (HCC)  Other lack of coordination  Muscle weakness (generalized)  Pain in right arm  Rationale for Evaluation and Treatment: Rehabilitation  SUBJECTIVE:   SUBJECTIVE STATEMENT: Pt reports she keeps trying and trying to get her shoulder loose and get fingers to cooperate. Pt accompanied by: self and significant other (Husband, [name])  PERTINENT HISTORY: hypertension, breast cancer, hyperlipidemia, Lt hip fracture secondary to fall 06/2022, CVA 06/2022   PRECAUTIONS: Fall, Rt breast cancer with node removal, osteoporosis  WEIGHT BEARING RESTRICTIONS: No  PAIN:  Are you having pain? Yes: NPRS scale: 1-2/10 Pain location: R shoulder Pain description: aching Aggravating factors: increased engagement in exercises/HEP Relieving factors: rest  FALLS: Has patient fallen in last 6 months? Yes. Number of falls 1 fall, one week after d/c home from SNF  LIVING ENVIRONMENT: Lives with: lives with their spouse Lives in: House/apartment Stairs: Yes: External: 2 steps; on left going up Has following equipment at home: Gilford Rile - 2 wheeled, Hemi walker, Wheelchair (manual), shower chair, and Grab bars  PLOF: Independent and Independent with basic ADLs  PATIENT GOALS: to get use of my R arm again  OBJECTIVE:   HAND DOMINANCE: Right  ADLs: Transfers/ambulation related to ADLs: Mod I with hemi-walker Eating: using L hand for eating, husband is assisting with cutting up food Grooming: Mod I, using L hand UB Dressing: Mod assist LB Dressing: Max assist Toileting: Mod I Bathing: Supervision - Mod I Tub Shower transfers: Dentist: Shower seat with back and Grab bars  UPPER EXTREMITY ROM:    Active ROM Right eval Left eval Right 10/18/22  Right 11/15/22 Right 12/18/22  Shoulder flexion 54   82 86  Shoulder abduction       Shoulder adduction       Shoulder extension       Shoulder internal rotation       Shoulder external rotation       Elbow flexion 108   132 136   Elbow extension -24   -12 -12  Wrist flexion    38 36  Wrist extension    18 24  Wrist ulnar deviation       Wrist radial deviation       Wrist pronation       Wrist supination       (Blank rows = not tested)  Passive ROM Right eval Left eval Right 10/18/22 Right 11/15/22 Right 12/18/22  Shoulder flexion 65  76 86 94  Shoulder abduction       Shoulder adduction       Shoulder extension       Shoulder internal rotation       Shoulder external rotation       Elbow flexion       Elbow extension -17  -15 -10 0  Wrist flexion 46  56 60 60  Wrist extension 27  50 40 56  Wrist ulnar deviation       Wrist  radial deviation       Wrist pronation       Wrist supination       (Blank rows = not tested)  Passive ROM Right 10/18/22 Right 11/15/22 Right 12/18/22  Thumb MCP (0-60)     Thumb IP (0-80)     Thumb Radial abd/add (0-55)     Thumb Palmar abd/add (0-45)     Thumb Opposition to Small Finger     Index MCP (0-90)  0   Index PIP (0-100)  -5   Index DIP (0-70)   -3   Long MCP (0-90)      Long PIP (0-100) -30  -30 -30  Long DIP (0-70) -20 -20 -5  Ring MCP (0-90)      Ring PIP (0-100) -25  -21 -30  Ring DIP (0-70) -15  -8 -7  Little MCP (0-90)      Little PIP (0-100)   -18   Little DIP (0-70)   -4   (Blank rows = not tested)   UPPER EXTREMITY MMT:   not assessed due to pain with any PROM, AROM    HAND FUNCTION: Loose gross grasp  R gross grasp: 5#; L gross grasp: 34#  COORDINATION: Box and Blocks:  Right 2blocks, Left 45 blocks. Modified to complete with RUE.  Pt able to grasp and move across to other side of box with barrier removed.   11/01/22: RUE 13 blocks 12/18/22: RUE 19 blocks  SENSATION: Mild decrease in R  EDEMA: mild edema in wrist hand, and fingers on RUE  OBSERVATIONS: Pt guarded with movement, hypersensitive to touch and PROM.     TODAY'S TREATMENT:                                   12/27/22 Functional grasp/reach: engaged in picking up and  stacking large foam blocks with R hand focusing on grasp and manipulation of blocks.  Pt demonstrating good gross grasp with RUE. OT incorporated colored pattern to facilitate increased sequencing and reaching outside BOS and across midline to facilitate increased elbow and shoulder ROM.  Pt leaning to L to compensate for decreased shoulder flexion as stacking higher, OT providing cues to draw attention to posture.  Pt unstacking blocks and placing them into box in low range to facilitate elbow extension and release of grasp. Grasp: 1,2# resistance clothespins with RUE for mid and high functional reaching and sustained pinch. Pt requiring increased time and effort to obtain grasp on clothespins and to release on vertical dowel.  Pt unable to grasp 2# clothespin therefore focused on hand placement and quality of grasp when placing and removing clothespins.  Pt demonstrating decreased incorporation of index finger into grasp, with cues pt able to increase attention to use of index finger and incorporate at ~50%.  Transitioned to pinch with forearm movement with supination and pronation to place clothespins on horizontal dowel.  Pt able to manage clothespins in both directions, however demonstrating increased difficulty with pronation.  Educated on functional carryover of supination/pronation and functional reach.    12/21/22 Functional reach: engaged in reaching with RUE outside BOS to pick up rings and then cross midline to place on vertical surface.  OT providing min cues and facilitation of task to increase shoulder flexion and abduction when reaching outside BOS and then crossing midline.  OT providing cues to exaggerate opening of hand when releasing ring.  Coordination/functional reach: engaged  in picking up Connect 4 pieces with focus on incorporating index finger in to pinch, sustained pinch during functional reach, and shoulder flexion to reach up to place into Connect 4 grid.  Pt initially with  exclusion of index finger requiring min cues to visually attend to hand to increase precision pinch and rotation of pieces finger tips.  Increased challenge by moving target to further facilitate PNF D1 diagonal with crossing midline.  IADL: reviewed recommendations to incorporate functional movements from session into homemaking tasks. Provided recommendations for placement of items to further facilitate abduction and PNF diagonals (ie. Laundry folding and sorting).     12/18/22 Box and blocks: RUE: 19 blocks  Pt demonstrating decreased shoulder hike and improved grasp and release this session, still demonstrating decreased finger extension with release of blocks. Measurements taken (see above) with pt demonstrating improvements in R shoulder and elbow mobility, still demonstrating decreased extension of all digits, especially index and long finger.  Self-feeding: Pt utilizing built up handle and gross grasp with simulated self-feeding with scooping beans from bowl.  Pt benefiting from built up handle to maintain sustained, loose grasp to engage in self-feeding.  OT educated on finger feeding to challenge various pinches, pinch strength, and shoulder ROM.  Pt able to touch to chin in upright position, encouraged attempts at self-feeding before next session. UE ROM: OT provided education on Hooklying Single Arm Chest Press with focus on motor control and not hiking shoulder during movement.  Pt is demonstrating increased elbow extension even in sitting this session.   PATIENT EDUCATION: Education details: ongoing condition specific education and progressive exercises. Person educated: Patient and Spouse Education method: Explanation, Demonstration, Tactile cues, Verbal cues, and Handouts Education comprehension: verbalized understanding and needs further education  HOME EXERCISE PROGRAM: Access Code: [email or web address] URL: [email or web address] Date: 12/06/2022 Prepared by: [facility]  Exercises - Supine Shoulder Flexion AAROM with Dowel  - 1 x daily - 3 x weekly - 2 sets - 10 reps - Supine Shoulder Protraction with Dowel  - 1 x daily - 3 x weekly - 2 sets - 10 reps - Supine Shoulder Abduction AAROM with Dowel  - 1 x daily - 3 x weekly - 2 sets - 10 reps - Touch finger to nose  - 1 x daily - 3 x weekly - 2 sets - 10 reps - 5-10 sec hold - Touch finger to nose  - 1 x daily - 3 x weekly - 2 sets - 10 reps - Hooklying Single Arm Chest Press  - 1 x daily - 3 x weekly - 2 sets - 10 reps - Thumb AROM Opposition  - 1 x daily - 7 x weekly - 1 sets - 5 reps - Seated Finger Composite Flexion Stretch  - 1 x daily - 7 x weekly - 1 sets - 5 reps - Hand PROM Finger Extension  - 1 x daily - 7 x weekly - 1 sets - 10 reps - Seated Forearm Pronation Supination AROM  - 1 x daily - 7 x weekly - 1 sets - 10 reps - Seated Wrist Flexion Extension PROM  - 1 x daily - 7 x weekly - 1 sets - 10 reps   GOALS: Goals reviewed with patient? Yes  SHORT TERM GOALS: Target date: 12/08/22  Pt and spouse will be independent with updated ROM and coordination HEP. Baseline: Goal status: MET - 12/06/22  2.  Pt will verbalize understanding of task  modifications and/or potential AE needs to increase ease, safety, and independence w/ ADLs. Baseline:  Goal status: MET - 12/06/22     LONG TERM GOALS: Target date: 12/29/22  1.  Pt will demonstrate ability to demonstrate improved functional grasp in RUE to open new jar, increased handwriting, and self-feeding. Baseline:  Goal status: IN PROGRESS  2.  Pt will demonstrate improved UE functional use for ADLs as evidenced by increasing box/ blocks score by 5 blocks with RUE, to be completed in standardized manner. Baseline:  13 blocks with RUE over barrier on 11/01/22 Goal status: Met - 19 blocks on 12/18/22  3.  Pt will demonstrate improved shoulder ROM to allow for reach to moderate height as needed for ADLs/IADLs (washing back,  putting on jacket, etc).  Baseline:  Goal status: IN PROGRESS  4. Pt will demonstrate improved extension in long and ring finger to allow for more functional hand opening.  Baseline:  Goal status: Met - 12/27/22  5.  Pt will report decreased pain in dominant RUE interfering with ADLs/IADLs by decreased impairment score on Quick DASH. Baseline: 77% Goal status: IN PROGRESS  6.  Pt will be independent with progressive splint wear and care PRN Baseline: modifications to splint TBD Goal status: Met - 12/27/22  ASSESSMENT:  CLINICAL IMPRESSION: Pt demonstrating improved postural control and awareness of shoulder positioning to decrease shoulder hike during functional reach and exercises, however demonstrating some left lateral lean this session. Pt is demonstrating mild improvements in shoulder and elbow ROM, especially with forward reach below 90* and elbow extension to neutral.  Pt continues to demonstrate flexion contractures in long and ring finger, with difficulty with extension however demonstrating improved functional use of hand during grasp/release and even able to facilitate increased pinch this session. Pt continues to benefit from encouragement to increase functional use of RUE into self-care tasks.    PERFORMANCE DEFICITS: in functional skills including ADLs, IADLs, coordination, sensation, edema, tone, ROM, strength, pain, flexibility, Fine motor control, Gross motor control, balance, body mechanics, endurance, decreased knowledge of use of DME, skin integrity, vision, and UE functional use, cognitive skills including emotional and safety awareness, and psychosocial skills including coping strategies.   IMPAIRMENTS: are limiting patient from ADLs, IADLs, and rest and sleep.   CO-MORBIDITIES: may have co-morbidities  that affects occupational performance. Patient will benefit from skilled OT to address above impairments and improve overall function.  MODIFICATION OR ASSISTANCE TO  COMPLETE EVALUATION: Min-Moderate modification of tasks or assist with assess necessary to complete an evaluation.  OT OCCUPATIONAL PROFILE AND HISTORY: Detailed assessment: Review of records and additional review of physical, cognitive, psychosocial history related to current functional performance.  CLINICAL DECISION MAKING: Moderate - several treatment options, min-mod task modification necessary  REHAB POTENTIAL: Good  EVALUATION COMPLEXITY: Moderate    PLAN:  OT FREQUENCY: 2x/week  OT DURATION: 8 weeks  PLANNED INTERVENTIONS: self care/ADL training, therapeutic exercise, therapeutic activity, neuromuscular re-education, manual therapy, passive range of motion, balance training, functional mobility training, splinting, paraffin, fluidotherapy, biofeedback, compression bandaging, moist heat, cryotherapy, contrast bath, patient/family education, visual/perceptual remediation/compensation, psychosocial skills training, coping strategies training, and DME and/or AE instructions  RECOMMENDED OTHER SERVICES: NA  CONSULTED AND AGREED WITH PLAN OF CARE: Patient and family member/caregiver  PLAN FOR NEXT SESSION: Review splint fit and make modification as needed.  AAROM or self-ROM to include shoulder flexion/extension and internal/external rotation, elbow, forearm.  Functional grasp and release.  Attempt handwriting and/or self-feeding.   Simonne Come, OTR/L 12/28/2022,  7:54 AM

## 2023-01-03 ENCOUNTER — Ambulatory Visit: Payer: Medicare PPO | Attending: Family Medicine

## 2023-01-03 DIAGNOSIS — I69354 Hemiplegia and hemiparesis following cerebral infarction affecting left non-dominant side: Secondary | ICD-10-CM

## 2023-01-03 DIAGNOSIS — R278 Other lack of coordination: Secondary | ICD-10-CM | POA: Insufficient documentation

## 2023-01-03 DIAGNOSIS — M25611 Stiffness of right shoulder, not elsewhere classified: Secondary | ICD-10-CM | POA: Diagnosis not present

## 2023-01-03 DIAGNOSIS — M79601 Pain in right arm: Secondary | ICD-10-CM | POA: Insufficient documentation

## 2023-01-03 DIAGNOSIS — R2681 Unsteadiness on feet: Secondary | ICD-10-CM | POA: Diagnosis not present

## 2023-01-03 DIAGNOSIS — M6281 Muscle weakness (generalized): Secondary | ICD-10-CM | POA: Diagnosis not present

## 2023-01-03 DIAGNOSIS — R2689 Other abnormalities of gait and mobility: Secondary | ICD-10-CM | POA: Diagnosis not present

## 2023-01-03 NOTE — Therapy (Signed)
OUTPATIENT PHYSICAL THERAPY TREATMENT NOTE   Patient Name: Angela Buchanan MRN: [id number] DOB:Jun 14, 1951, 72 y.o., female Today's Date: 01/03/2023  PCP:  Sela Hilding, MD   REFERRING PROVIDER:  Sela Hilding, MD      END OF SESSION:   PT End of Session - 01/03/23 1526     Visit Number 18    Date for PT Re-Evaluation 01/19/23    Authorization Type 36 visit total (PT)    Authorization Time Period 11/8-4/18/24    Authorization - Visit Number 17    Authorization - Number of Visits 36    Progress Note Due on Visit 20    PT Start Time [id number]    PT Stop Time 1528    PT Time Calculation (min) 41 min    Activity Tolerance Patient tolerated treatment well    Behavior During Therapy [facility] for tasks assessed/performed                           Past Medical History:  Diagnosis Date   Atypical chest pain 07/19/2021   Bigeminy    no current med.   Breast cancer 06/2017   right   Bruises easily    CAD in native artery 10/31/2022   Carotid bruit 11/13/2019   Dental crowns present    History of diverticulitis    Hypertension    states under control with med., has been on med. x 5 yr.   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   Past Surgical History:  Procedure Laterality Date   ABDOMINAL HYSTERECTOMY     partial   BREAST LUMPECTOMY Right 06/22/2017   Malignant   BREAST LUMPECTOMY WITH RADIOACTIVE SEED AND SENTINEL LYMPH NODE BIOPSY Right 06/22/2017   Procedure: RIGHT BREAST LUMPECTOMY WITH RADIOACTIVE SEED AND RIGHT AXILLARY DEEP SENTINEL LYMPH NODE BIOPSY WITH BLUE DYE INJECTION;  Surgeon: Fanny Skates, MD;  Location: [facility];  Service: General;  Laterality: Right;   BREAST  LUMPECTOMY WITH RADIOACTIVE SEED LOCALIZATION Left 06/22/2017   Procedure: LEFT BREAST LUMPECTOMY WITH RADIOACTIVE SEED LOCALIZATION;  Surgeon: Fanny Skates, MD;  Location: [facility];  Service: General;  Laterality: Left;   CATARACT EXTRACTION W/ INTRAOCULAR LENS  IMPLANT, BILATERAL Bilateral    EXCISION OF BREAST BIOPSY Left 06/22/2017   benign   LOOP RECORDER INSERTION N/A 05/16/2022   Procedure: LOOP RECORDER INSERTION;  Surgeon: Vickie Epley, MD;  Location: [facility];  Service: Cardiovascular;  Laterality: N/A;   PERCUTANEOUS PINNING Right 06/22/2022   Procedure: PERCUTANEOUS PINNING OF RIGHT HIP;  Surgeon: Hiram Gash, MD;  Location: [facility];  Service: Orthopedics;  Laterality: Right;   TONSILLECTOMY     age 32   Patient Active Problem List   Diagnosis Date Noted   Right spastic hemiplegia 12/14/2022   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   CAD in native artery 10/31/2022   Malnutrition of moderate degree 06/22/2022   Closed fracture of femur, neck 06/21/2022   Urinary frequency 06/21/2022   Hypokalemia 06/21/2022   Heart murmur 06/21/2022   Hypothyroidism 06/21/2022   Dysphagia 05/17/2022   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   CVA (cerebral vascular accident) 05/12/2022   Osteoporosis 01/24/2022   Atypical chest  pain 07/19/2021   Temporomandibular joint (TMJ) pain 07/16/2020   Referred otalgia of both ears 07/16/2020   PAC (premature atrial contraction) 05/11/2020   PVC (premature ventricular contraction) 05/11/2020   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   Carotid bruit 11/13/2019   Pure hypercholesterolemia 11/13/2019   Malignant neoplasm of upper-outer quadrant of right breast in female, estrogen receptor positive 05/22/2017   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   Diplopia 09/17/2014   Dizziness and giddiness 09/17/2014   Essential hypertension 07/22/2014   Chest pain 07/22/2014   Family history of heart disease 07/22/2014     REFERRING DIAG:  R29.6 (ICD-10-CM) - Repeated falls, closed fracture of Rt hip    THERAPY DIAG:  Other lack of coordination  Muscle weakness (generalized)  Hemiplegia and hemiparesis following cerebral infarction affecting left non-dominant side  Unsteadiness on feet  Rationale for Evaluation and Treatment Rehabilitation  PERTINENT HISTORY: hypertension, breast cancer, hyperlipidemia, Lt hip fracture secondary to fall 06/2022, CVA 06/2022   PRECAUTIONS:  Fall, Rt breast cancer with node removal, osteoporosis    SUBJECTIVE:                                                                                                                                                                                      SUBJECTIVE STATEMENT:  I'm using my cane and doing very well with it.     PAIN:  Are you having pain? Not right now but my right hand and arm can be very sore at times.    OBJECTIVE: (objective measures completed at initial evaluation unless otherwise dated)  DIAGNOSTIC FINDINGS: Rt hip fracture and ORIF     COGNITION: Overall cognitive status: Within functional limits for tasks assessed                         SENSATION: WFL     POSTURE: rounded shoulders, forward head, flexed trunk , and weight shift left   PALPATION: NA   LOWER EXTREMITY ROM: Rt hip limited by 50%, hamstrings limited by 50% bil.    LOWER EXTREMITY MMT:   MMT Right eval Right  09/27/22 Left eval Right 10/30/22 Left 10/30/22  Hip flexion 3+ 4- 4- 4- 4-  Hip extension         Hip abduction 3-  4    Hip adduction         Hip internal rotation         Hip external rotation         Knee flexion 3- 4- 3+    Knee extension 3+ 4 4- 4 painful hip 4-  Ankle dorsiflexion 3+ 4- 4-  4- 4  Ankle plantarflexion         Ankle inversion         Ankle eversion          (Blank rows = not tested)   FUNCTIONAL TESTS:   Eval: Timed up and go (TUG): 1 min, 27 seconds   09/27/22: 5x sit to stand: 29  seconds with use of Lt hand  TUG: 27 seconds   10/30/22: 5x sit to stand: 23 sec use of LT hand     TUG: 26 sec with standard cane   11/20/22: 5x sit to stand: 18.68 seconds    TUG: 19.45 without device    3 minute walk test: 225 feet with a standard cane GAIT: Distance walked: 25 feet  Assistive device utilized: Hemi walker on Lt Level of assistance: SBA Comments: slow mobility, reduced trunk rotation, reduce Rt LE stance time and reduced Rt arm swing     TODAY'S TREATMENT:    01/03/23: Nustep L3 x 8 min with PT present to monitor and discuss progress - legs only- instructed pt to keep SPM >30  Sit to stand: 2x10 holding 5# kettlebell on Lt LAQ 2.5# Rt 2x10 Tandem stance: Rt and Lt- next to barre 3x10 seconds each standing on balance pad Standing on level surface: holding blue weighted ball: D2 diagonals x10 each, standing on balance pad, same UE movement x10 Standing on balance pad: alternating arm motion to promote trunk rotation and weight shift Green loop around thighs: sidestepping at barre with mini squat and UE support 3 laps, Stepping over hurdles at barre: forward and lateral, no UE support today with forward over hurdles  Gait with cane- good step symmetry and use of cane  12/26/22: Nustep L3 x 8 min with PT present to monitor and discuss progress - legs only  Sit to stand: x10 without UE support LAQ 2# Rt 2x10 Tandem stance: Rt and Lt- next to barre 3x10 seconds each Standing on level surface: holding blue weighted ball: D2 diagonals x10 each, standing on balance pad, same UE movement x10 Standing on balance pad: alternating arm motion to promote trunk rotation and weight shift Green loop around thighs: sidestepping at barre with mini squat and UE support 3 laps, Standing marching 2x10 with 2# weight added-standing on balance pad Stepping over hurdles in // bars with Lt UE support only: forward and lateral, 4 laps each Gait with cane- good step symmetry and use of  cane  12/20/22: Nustep L3 x 8 min with PT present to monitor and discuss progress - legs only  Sit to stand: x10 without UE support LAQ 2# Rt 2x10 Tandem stance: Rt and Lt- next to barre 3x10 seconds each Standing on level surface: holding blue weighted ball: D2 diagonals x10 each, standing on balance pad, same UE movement x10 Standing on balance pad: alternating arm motion to promote trunk rotation and weight shift Green loop around thighs: sidestepping at barre with mini squat and UE support 3 laps,  Standing marching 2x10 with 2# weight added-standing on balance pad 6" step ups on Lt x10, Rt x5 Gait with small base quad cane: around clinic x 5 minute    PATIENT EDUCATION:  Education details: Access Code: [email or web address] Person educated: Patient Education method: Explanation, Demonstration, and Handouts Education comprehension: verbalized understanding and returned demonstration   HOME EXERCISE PROGRAM: Access Code: [email or web address] URL: [email or web address] Date: 09/27/2022 Prepared by: Claiborne Billings  Exercises - Seated Long Arc Quad  - 3 x daily - 7  x weekly - 2 sets - 10 reps - 5 hold - Seated March   - 3 x daily - 7 x weekly - 3 sets - 10 reps - Seated Heel Toe Raises   - 3 x daily - 7 x weekly - 2 sets - 10 reps - Seated Isometric Hip Adduction with Ball  - 3 x daily - 7 x weekly - 2 sets - 10 reps - Sit to Stand with Armchair  - 2 x daily - 7 x weekly - 2 sets - 5-10 reps - Standing Hip Abduction with Counter Support  - 1 x daily - 7 x weekly - 2 sets - 10 reps - Heel Raises with Counter Support  - 2 x daily - 7 x weekly - 2 sets - 10 reps  Patient Education - Walking with a Single General Innes - Modified 4 Point Gait Pattern  ASSESSMENT:   CLINICAL IMPRESSION:  Pt continues to use her quad cane at home and is safe with this.  Pt had advanced to higher level tasks with additional weight and challenge over the past 3 weeks.  Pt was able to negotiate hurdles at [facility] without UE  support today. Pt reports improved endurance and confidence with balance tasks at home and in the community.  Patient will benefit from skilled PT to address the below impairments and improve overall function.   OBJECTIVE IMPAIRMENTS: Abnormal gait, decreased activity tolerance, decreased balance, decreased mobility, difficulty walking, decreased strength, decreased safety awareness, impaired perceived functional ability, impaired flexibility, postural dysfunction, and pain.    ACTIVITY LIMITATIONS: carrying, lifting, sitting, standing, stairs, transfers, hygiene/grooming, and locomotion level   PARTICIPATION LIMITATIONS: meal prep, cleaning, laundry, driving, shopping, and community activity   PERSONAL FACTORS: Age, Past/current experiences, and 1-2 comorbidities: CVA, falls, Rt hip fracture with ORIF  are also affecting patient's functional outcome.    REHAB POTENTIAL: Good   CLINICAL DECISION MAKING: Evolving/moderate complexity   EVALUATION COMPLEXITY: Moderate     GOALS: Goals reviewed with patient? Yes   SHORT TERM GOALS: Target date: 09/06/2022   Be independent in initial HEP Baseline: Goal status: Goal met 08/14/22   2.  Improve LE strength to perform sit to stand with moderate Lt UE support Baseline:  Goal status: Goal met 08/28/22  3.  Perform TUG in < or = to 60 seconds to reduce falls risk Baseline: 27 seconds (09/27/22) Goal status: MET      LONG TERM GOALS: Target date:  01/19/23   Be independent in advanced HEP Baseline: independent in current HEP and further progress is needed  Goal status: in progress    2.  Perform TUG in < or = to 15 seconds to reduce falls risk Baseline: 19.45 seconds (11/20/22) Goal status: In progress    3.  ambulate > or = to 350 feet in 3 minutes to improve community ambulation and independence  Baseline: 225 (11/20/22) Goal status: NEW    4.  Ambulate with hemi walker with supervision or no guard due to improved gait Baseline:  able to do this Goal status: MET   5.  perform 5x sit to stand in < or = to 14 seconds to reduce falls risk  Baseline: 18.68 seconds  (11/20/22)  Goal Status: NEW  6. Ambulate with cane for all distances and demonstrate independence due to improved stability  Baseline: using hemiwalker and will get cane soon.  Walked well with standard cane in the clinic  (11/20/22)  Goal status: INITIAL  PLAN:   PT FREQUENCY: 1-2x/week   PT DURATION: 8 weeks   PLANNED INTERVENTIONS: Therapeutic exercises, Therapeutic activity, Neuromuscular re-education, Balance training, Gait training, Patient/Family education, Self Care, Joint mobilization, Stair training, Dry Needling, Electrical stimulation, Cryotherapy, Moist heat, Taping, Manual therapy, and Re-evaluation   PLAN FOR NEXT SESSION: Continue to work on gait , strength and balance. Request date extension before 01/19/23  Sigurd Sos, PT 01/03/23 3:31 PM

## 2023-01-08 ENCOUNTER — Ambulatory Visit: Payer: Medicare PPO | Admitting: Occupational Therapy

## 2023-01-08 DIAGNOSIS — M79601 Pain in right arm: Secondary | ICD-10-CM | POA: Diagnosis not present

## 2023-01-08 DIAGNOSIS — M6281 Muscle weakness (generalized): Secondary | ICD-10-CM | POA: Diagnosis not present

## 2023-01-08 DIAGNOSIS — R2689 Other abnormalities of gait and mobility: Secondary | ICD-10-CM | POA: Diagnosis not present

## 2023-01-08 DIAGNOSIS — R2681 Unsteadiness on feet: Secondary | ICD-10-CM | POA: Diagnosis not present

## 2023-01-08 DIAGNOSIS — R278 Other lack of coordination: Secondary | ICD-10-CM | POA: Diagnosis not present

## 2023-01-08 DIAGNOSIS — M25611 Stiffness of right shoulder, not elsewhere classified: Secondary | ICD-10-CM | POA: Diagnosis not present

## 2023-01-08 DIAGNOSIS — I69354 Hemiplegia and hemiparesis following cerebral infarction affecting left non-dominant side: Secondary | ICD-10-CM | POA: Diagnosis not present

## 2023-01-08 NOTE — Therapy (Signed)
OUTPATIENT OCCUPATIONAL THERAPY  Treatment Session   Patient Name: Angela Buchanan MRN: [id number] DOB:10/10/1950, 72 y.o., female Today's Date: 01/08/2023  PCP: Shon Hale, MD REFERRING PROVIDER: Shon Hale, MD       END OF SESSION:  OT End of Session - 01/08/23 1321     Visit Number 23    Number of Visits 41    Date for OT Re-Evaluation 02/23/23    Authorization Type [employer]    OT Start Time 1317    OT Stop Time 1400    OT Time Calculation (min) 43 min                                 Past Medical History:  Diagnosis Date   Atypical chest pain 07/19/2021   Bigeminy    no current med.   Breast cancer 06/2017   right   Bruises easily    CAD in native artery 10/31/2022   Carotid bruit 11/13/2019   Dental crowns present    History of diverticulitis    Hypertension    states under control with med., has been on med. x 5 yr.   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   Past Surgical History:  Procedure Laterality Date   ABDOMINAL HYSTERECTOMY     partial   BREAST LUMPECTOMY Right 06/22/2017   Malignant   BREAST LUMPECTOMY WITH RADIOACTIVE SEED AND SENTINEL LYMPH NODE BIOPSY Right 06/22/2017   Procedure: RIGHT BREAST LUMPECTOMY WITH RADIOACTIVE SEED AND RIGHT AXILLARY DEEP SENTINEL LYMPH NODE BIOPSY WITH BLUE DYE INJECTION;  Surgeon: Claud Kelp, MD;  Location: [facility];  Service: General;  Laterality: Right;   BREAST LUMPECTOMY WITH RADIOACTIVE SEED LOCALIZATION Left 06/22/2017   Procedure: LEFT BREAST LUMPECTOMY WITH RADIOACTIVE SEED LOCALIZATION;  Surgeon: Claud Kelp, MD;  Location: [facility];  Service: General;  Laterality:  Left;   CATARACT EXTRACTION W/ INTRAOCULAR LENS  IMPLANT, BILATERAL Bilateral    EXCISION OF BREAST BIOPSY Left 06/22/2017   benign   LOOP RECORDER INSERTION N/A 05/16/2022   Procedure: LOOP RECORDER INSERTION;  Surgeon: Lanier Prude, MD;  Location: [facility];  Service: Cardiovascular;  Laterality: N/A;   PERCUTANEOUS PINNING Right 06/22/2022   Procedure: PERCUTANEOUS PINNING OF RIGHT HIP;  Surgeon: Bjorn Pippin, MD;  Location: [facility];  Service: Orthopedics;  Laterality: Right;   TONSILLECTOMY     age 43   Patient Active Problem List   Diagnosis Date Noted   Right spastic hemiplegia 12/14/2022   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   CAD in native artery 10/31/2022   Malnutrition of moderate degree 06/22/2022   Closed fracture of femur, neck 06/21/2022   Urinary frequency 06/21/2022   Hypokalemia 06/21/2022   Heart murmur 06/21/2022   Hypothyroidism 06/21/2022   Dysphagia 05/17/2022   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   CVA (cerebral vascular accident) 05/12/2022   Osteoporosis 01/24/2022   Atypical chest pain 07/19/2021   Temporomandibular joint (TMJ) pain 07/16/2020   Referred otalgia of both ears 07/16/2020   PAC (premature atrial contraction) 05/11/2020   PVC (premature ventricular contraction) 05/11/2020   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   Carotid bruit 11/13/2019  Pure hypercholesterolemia 11/13/2019   Malignant neoplasm of upper-outer quadrant of right breast in female, estrogen receptor positive 05/22/2017   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   Diplopia 09/17/2014   Dizziness and giddiness 09/17/2014   Essential hypertension 07/22/2014   Chest pain 07/22/2014   Family history of heart disease 07/22/2014    ONSET DATE: CVA 05/12/22  REFERRING DIAG: M79.641 (ICD-10-CM) - Pain in right hand  THERAPY DIAG:  Other lack of coordination  Muscle weakness (generalized)  Hemiplegia and hemiparesis following cerebral infarction affecting  left non-dominant side  Pain in right arm  Rationale for Evaluation and Treatment: Rehabilitation  SUBJECTIVE:   SUBJECTIVE STATEMENT: Pt reports that she got approved for botox and will get the injections Thursday. Pt accompanied by: self and significant other (Husband, [name])  PERTINENT HISTORY: hypertension, breast cancer, hyperlipidemia, Lt hip fracture secondary to fall 06/2022, CVA 06/2022   PRECAUTIONS: Fall, Rt breast cancer with node removal, osteoporosis  WEIGHT BEARING RESTRICTIONS: No  PAIN:  Are you having pain? No  FALLS: Has patient fallen in last 6 months? Yes. Number of falls 1 fall, one week after d/c home from SNF  LIVING ENVIRONMENT: Lives with: lives with their spouse Lives in: House/apartment Stairs: Yes: External: 2 steps; on left going up Has following equipment at home: Dan HumphreysWalker - 2 wheeled, Hemi walker, Wheelchair (manual), shower chair, and Grab bars  PLOF: Independent and Independent with basic ADLs  PATIENT GOALS: to get use of my R arm again  OBJECTIVE:   HAND DOMINANCE: Right  ADLs: Transfers/ambulation related to ADLs: Mod I with hemi-walker Eating: using L hand for eating, husband is assisting with cutting up food Grooming: Mod I, using L hand UB Dressing: Mod assist LB Dressing: Max assist Toileting: Mod I Bathing: Supervision - Mod I Tub Shower transfers: Tourist information centre managerMin assist Equipment: Shower seat with back and Grab bars  UPPER EXTREMITY ROM:    Active ROM Right eval Left eval Right 10/18/22  Right 11/15/22 Right 12/18/22  Shoulder flexion 54   82 86  Shoulder abduction       Shoulder adduction       Shoulder extension       Shoulder internal rotation       Shoulder external rotation       Elbow flexion 108   132 136  Elbow extension -24   -12 -12  Wrist flexion    38 36  Wrist extension    18 24  Wrist ulnar deviation       Wrist radial deviation       Wrist pronation       Wrist supination       (Blank rows = not  tested)  Passive ROM Right eval Left eval Right 10/18/22 Right 11/15/22 Right 12/18/22  Shoulder flexion 65  76 86 94  Shoulder abduction       Shoulder adduction       Shoulder extension       Shoulder internal rotation       Shoulder external rotation       Elbow flexion       Elbow extension -17  -15 -10 0  Wrist flexion 46  56 60 60  Wrist extension 27  50 40 56  Wrist ulnar deviation       Wrist radial deviation       Wrist pronation       Wrist supination       (Blank rows = not tested)  Passive ROM Right 10/18/22 Right 11/15/22 Right 12/18/22 Right 01/08/23  Thumb MCP (0-60)      Thumb IP (0-80)      Thumb Radial abd/add (0-55)      Thumb Palmar abd/add (0-45)      Thumb Opposition to Small Finger      Index MCP (0-90)  0    Index PIP (0-100)  -5    Index DIP (0-70)   -3    Long MCP (0-90)       Long PIP (0-100) -30  -30 -30 -30  Long DIP (0-70) -20 -20 -5 -6  Ring MCP (0-90)       Ring PIP (0-100) -25  -21 -30 -24  Ring DIP (0-70) -15  -8 -7 -6  Little MCP (0-90)       Little PIP (0-100)   -18    Little DIP (0-70)   -4    (Blank rows = not tested)   UPPER EXTREMITY MMT:   not assessed due to pain with any PROM, AROM    HAND FUNCTION: Loose gross grasp  R gross grasp: 5#; L gross grasp: 34# 01/08/23: R: 6# gross grasp, lateral pinch 6# (L: 11#), tip to tip R: 2# (L: 8#)  COORDINATION: Box and Blocks:  Right 2blocks, Left 45 blocks. Modified to complete with RUE.  Pt able to grasp and move across to other side of box with barrier removed.   11/01/22: RUE 13 blocks 12/18/22: RUE 19 blocks  SENSATION: Mild decrease in R  EDEMA: mild edema in wrist hand, and fingers on RUE  OBSERVATIONS: Pt guarded with movement, hypersensitive to touch and PROM.     TODAY'S TREATMENT:                                   01/08/23 Quick Dash: 61.4% with continued reports of severe difficulty or unable to perform 6 of 11 tasks.  However down from 77%. Measurements taken (see  above) with pt demonstrating improvements in PROM in R ring finger, however still demonstrating decreased extension of all digits, especially index and long finger.  Coordination: attempted 9 hole peg test, however pt with increased difficulty with picking up and manipulating small pegs, therefore transitioned to larger peg.  Pt then demonstrating improved ease with picking up with either tip to tip or 3 jaw chuck pinch.  When pt utilizing long finger, index finger will flex out of the way requiring cues to open full hand prior to completion of task to incorporate index finger into task.  Pt initially having to pick up pegs from OT's palm then able to transition to picking up from container. Incorporated functional reach with reaching across midline and forward to facilitate increased shoulder and elbow ROM.    12/27/22 Functional grasp/reach: engaged in picking up and stacking large foam blocks with R hand focusing on grasp and manipulation of blocks.  Pt demonstrating good gross grasp with RUE. OT incorporated colored pattern to facilitate increased sequencing and reaching outside BOS and across midline to facilitate increased elbow and shoulder ROM.  Pt leaning to L to compensate for decreased shoulder flexion as stacking higher, OT providing cues to draw attention to posture.  Pt unstacking blocks and placing them into box in low range to facilitate elbow extension and release of grasp. Grasp: 1,2# resistance clothespins with RUE for mid and high functional reaching and sustained pinch. Pt requiring  increased time and effort to obtain grasp on clothespins and to release on vertical dowel.  Pt unable to grasp 2# clothespin therefore focused on hand placement and quality of grasp when placing and removing clothespins.  Pt demonstrating decreased incorporation of index finger into grasp, with cues pt able to increase attention to use of index finger and incorporate at ~50%.  Transitioned to pinch with forearm  movement with supination and pronation to place clothespins on horizontal dowel.  Pt able to manage clothespins in both directions, however demonstrating increased difficulty with pronation.  Educated on functional carryover of supination/pronation and functional reach.    12/21/22 Functional reach: engaged in reaching with RUE outside BOS to pick up rings and then cross midline to place on vertical surface.  OT providing min cues and facilitation of task to increase shoulder flexion and abduction when reaching outside BOS and then crossing midline.  OT providing cues to exaggerate opening of hand when releasing ring.  Coordination/functional reach: engaged in picking up Connect 4 pieces with focus on incorporating index finger in to pinch, sustained pinch during functional reach, and shoulder flexion to reach up to place into Connect 4 grid.  Pt initially with exclusion of index finger requiring min cues to visually attend to hand to increase precision pinch and rotation of pieces finger tips.  Increased challenge by moving target to further facilitate PNF D1 diagonal with crossing midline.  IADL: reviewed recommendations to incorporate functional movements from session into homemaking tasks. Provided recommendations for placement of items to further facilitate abduction and PNF diagonals (ie. Laundry folding and sorting).     PATIENT EDUCATION: Education details: ongoing condition specific education and progressive exercises. Person educated: Patient and Spouse Education method: Explanation, Demonstration, Tactile cues, Verbal cues, and Handouts Education comprehension: verbalized understanding and needs further education  HOME EXERCISE PROGRAM: Access Code: [email or web address] URL: [email or web address] Date: 12/06/2022 Prepared by: [facility] - [facility]  Exercises - Supine Shoulder Flexion AAROM with Dowel  - 1 x daily - 3 x weekly - 2 sets - 10 reps - Supine  Shoulder Protraction with Dowel  - 1 x daily - 3 x weekly - 2 sets - 10 reps - Supine Shoulder Abduction AAROM with Dowel  - 1 x daily - 3 x weekly - 2 sets - 10 reps - Touch finger to nose  - 1 x daily - 3 x weekly - 2 sets - 10 reps - 5-10 sec hold - Touch finger to nose  - 1 x daily - 3 x weekly - 2 sets - 10 reps - Hooklying Single Arm Chest Press  - 1 x daily - 3 x weekly - 2 sets - 10 reps - Thumb AROM Opposition  - 1 x daily - 7 x weekly - 1 sets - 5 reps - Seated Finger Composite Flexion Stretch  - 1 x daily - 7 x weekly - 1 sets - 5 reps - Hand PROM Finger Extension  - 1 x daily - 7 x weekly - 1 sets - 10 reps - Seated Forearm Pronation Supination AROM  - 1 x daily - 7 x weekly - 1 sets - 10 reps - Seated Wrist Flexion Extension PROM  - 1 x daily - 7 x weekly - 1 sets - 10 reps   GOALS: Goals reviewed with patient? Yes  SHORT TERM GOALS: Target date: 12/08/22  Pt and spouse will be independent with updated ROM and coordination HEP. Baseline: Goal status:  MET - 12/06/22  2.  Pt will verbalize understanding of task modifications and/or potential AE needs to increase ease, safety, and independence w/ ADLs. Baseline:  Goal status: MET - 12/06/22  NEW SHORT TERM GOALS: Target date: 02/02/23  Pt and spouse will be independent with updated ROM and coordination HEP. Baseline: Goal status: INITIAL  2.  Pt will verbalize understanding of task modifications and/or potential AE needs to increase ease, safety, and independence w/ ADLs. Baseline: reinstated as pt to receive botox and will benefit from additional education with improved function Goal status: INITIAL    LONG TERM GOALS: Target date: 12/29/22  1.  Pt will demonstrate ability to demonstrate improved functional grasp in RUE to open new jar, increased handwriting, and self-feeding. Baseline:  Goal status: not met  2.  Pt will demonstrate improved UE functional use for ADLs as evidenced by increasing box/ blocks score by 5  blocks with RUE, to be completed in standardized manner. Baseline:  13 blocks with RUE over barrier on 11/01/22 Goal status: Met - 19 blocks on 12/18/22  3.  Pt will demonstrate improved shoulder ROM to allow for reach to moderate height as needed for ADLs/IADLs (washing back, putting on jacket, etc).  Baseline:  Goal status: not met  4. Pt will demonstrate improved extension in long and ring finger to allow for more functional hand opening.  Baseline:  Goal status: Met - 12/27/22  5.  Pt will report decreased pain in dominant RUE interfering with ADLs/IADLs by decreased impairment score on Quick DASH. Baseline: 77% Goal status: Met - 61.4% with reports of pain still at moderate but improved functional use/engagement in tasks. 01/08/23  6.  Pt will be independent with progressive splint wear and care PRN Baseline: modifications to splint TBD Goal status: Met - 12/27/22  NEW LONG TERM GOALS: Target date: 02/23/23  1.  Pt will demonstrate ability to demonstrate improved functional grasp in RUE to open new jar, increased handwriting, and self-feeding. Baseline:  Goal status: INITIAL  2.  Pt will demonstrate improved UE functional use for ADLs as evidenced by increasing box/ blocks score by 5 blocks with RUE, to be completed in standardized manner. Baseline:  19 blocks with RUE Goal status: INITIAL  3.  Pt will demonstrate improved shoulder ROM to allow for reach to moderate height as needed for ADLs/IADLs (washing back, putting on jacket, etc).  Baseline:  Goal status: INITIAL  4. Pt will demonstrate improvement of finger extension by 15* TAM of long and ringer finger to allow for more functional hand opening.  Baseline: -36 long finger, -30 ring finger  Goal status: INITIAL  5. Pt will demonstrate improved functional grasp by 5# with RUE to allow for increased independence with cutting foods and opening food containers.  Baseline: R: 6#  Goal status: INITIAL  ASSESSMENT:  CLINICAL  IMPRESSION: Pt demonstrating improved postural control and awareness of shoulder positioning to decrease shoulder hike during functional reach and exercises, even allowing for increased focus on picking up small pegs and attempts at assessing pinch and grip strength. Pt is demonstrating mild improvements in shoulder and elbow ROM, especially with forward reach below 90* and elbow extension to neutral.  Pt continues to demonstrate flexion contractures in long and ring finger, with difficulty with extension however demonstrating improved functional use of hand during grasp/release and even able to facilitate increased pinch this session.  Pt to undergo botox injections in for finger, elbow, and shoulder movement and will benefit from OT  services to focus on increased ROM and functional use in conjunction with botox.   PERFORMANCE DEFICITS: in functional skills including ADLs, IADLs, coordination, sensation, edema, tone, ROM, strength, pain, flexibility, Fine motor control, Gross motor control, balance, body mechanics, endurance, decreased knowledge of use of DME, skin integrity, vision, and UE functional use, cognitive skills including emotional and safety awareness, and psychosocial skills including coping strategies.   IMPAIRMENTS: are limiting patient from ADLs, IADLs, and rest and sleep.   CO-MORBIDITIES: may have co-morbidities  that affects occupational performance. Patient will benefit from skilled OT to address above impairments and improve overall function.  MODIFICATION OR ASSISTANCE TO COMPLETE EVALUATION: Min-Moderate modification of tasks or assist with assess necessary to complete an evaluation.  OT OCCUPATIONAL PROFILE AND HISTORY: Detailed assessment: Review of records and additional review of physical, cognitive, psychosocial history related to current functional performance.  CLINICAL DECISION MAKING: Moderate - several treatment options, min-mod task modification necessary  REHAB  POTENTIAL: Good  EVALUATION COMPLEXITY: Moderate    PLAN:  OT FREQUENCY: 2x/week  OT DURATION: 6 weeks  PLANNED INTERVENTIONS: self care/ADL training, therapeutic exercise, therapeutic activity, neuromuscular re-education, manual therapy, passive range of motion, balance training, functional mobility training, splinting, paraffin, fluidotherapy, biofeedback, compression bandaging, moist heat, cryotherapy, contrast bath, patient/family education, visual/perceptual remediation/compensation, psychosocial skills training, coping strategies training, and DME and/or AE instructions  RECOMMENDED OTHER SERVICES: NA  CONSULTED AND AGREED WITH PLAN OF CARE: Patient and family member/caregiver  PLAN FOR NEXT SESSION: AAROM or self-ROM to include shoulder flexion/extension and internal/external rotation, elbow, forearm.  Functional grasp and release.  Attempt handwriting and/or self-feeding, fold laundry/paper.   Rosalio Loud, OTR/L 01/08/2023, 1:23 PM

## 2023-01-09 ENCOUNTER — Ambulatory Visit: Payer: Medicare PPO | Admitting: Physical Medicine & Rehabilitation

## 2023-01-09 ENCOUNTER — Encounter: Payer: Medicare PPO | Admitting: Occupational Therapy

## 2023-01-10 ENCOUNTER — Ambulatory Visit: Payer: Medicare PPO

## 2023-01-10 DIAGNOSIS — M6281 Muscle weakness (generalized): Secondary | ICD-10-CM

## 2023-01-10 DIAGNOSIS — M79601 Pain in right arm: Secondary | ICD-10-CM | POA: Diagnosis not present

## 2023-01-10 DIAGNOSIS — R2689 Other abnormalities of gait and mobility: Secondary | ICD-10-CM | POA: Diagnosis not present

## 2023-01-10 DIAGNOSIS — R278 Other lack of coordination: Secondary | ICD-10-CM | POA: Diagnosis not present

## 2023-01-10 DIAGNOSIS — R2681 Unsteadiness on feet: Secondary | ICD-10-CM | POA: Diagnosis not present

## 2023-01-10 DIAGNOSIS — I69354 Hemiplegia and hemiparesis following cerebral infarction affecting left non-dominant side: Secondary | ICD-10-CM | POA: Diagnosis not present

## 2023-01-10 DIAGNOSIS — M25611 Stiffness of right shoulder, not elsewhere classified: Secondary | ICD-10-CM | POA: Diagnosis not present

## 2023-01-10 NOTE — Therapy (Signed)
OUTPATIENT PHYSICAL THERAPY TREATMENT NOTE   Patient Name: Angela Buchanan MRN: [id number] DOB:Jan 25, 1951, 72 y.o., female Today's Date: 01/10/2023  PCP:  Garth Bigness, MD   REFERRING PROVIDER:  Garth Bigness, MD      END OF SESSION:   PT End of Session - 01/10/23 1529     Visit Number 19    Date for PT Re-Evaluation 01/19/23    Authorization Type 36 visit total (PT)    Authorization - Visit Number 18    Authorization - Number of Visits 36    Progress Note Due on Visit 20    PT Start Time 1447    PT Stop Time 1528    PT Time Calculation (min) 41 min    Activity Tolerance Patient tolerated treatment well    Behavior During Therapy [facility] for tasks assessed/performed                            Past Medical History:  Diagnosis Date   Atypical chest pain 07/19/2021   Bigeminy    no current med.   Breast cancer 06/2017   right   Bruises easily    CAD in native artery 10/31/2022   Carotid bruit 11/13/2019   Dental crowns present    History of diverticulitis    Hypertension    states under control with med., has been on med. x 5 yr.   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   Past Surgical History:  Procedure Laterality Date   ABDOMINAL HYSTERECTOMY     partial   BREAST LUMPECTOMY Right 06/22/2017   Malignant   BREAST LUMPECTOMY WITH RADIOACTIVE SEED AND SENTINEL LYMPH NODE BIOPSY Right 06/22/2017   Procedure: RIGHT BREAST LUMPECTOMY WITH RADIOACTIVE SEED AND RIGHT AXILLARY DEEP SENTINEL LYMPH NODE BIOPSY WITH BLUE DYE INJECTION;  Surgeon: Claud Kelp, MD;  Location: [facility];  Service: General;  Laterality: Right;   BREAST LUMPECTOMY WITH RADIOACTIVE SEED LOCALIZATION  Left 06/22/2017   Procedure: LEFT BREAST LUMPECTOMY WITH RADIOACTIVE SEED LOCALIZATION;  Surgeon: Claud Kelp, MD;  Location: [facility];  Service: General;  Laterality: Left;   CATARACT EXTRACTION W/ INTRAOCULAR LENS  IMPLANT, BILATERAL Bilateral    EXCISION OF BREAST BIOPSY Left 06/22/2017   benign   LOOP RECORDER INSERTION N/A 05/16/2022   Procedure: LOOP RECORDER INSERTION;  Surgeon: Lanier Prude, MD;  Location: [facility];  Service: Cardiovascular;  Laterality: N/A;   PERCUTANEOUS PINNING Right 06/22/2022   Procedure: PERCUTANEOUS PINNING OF RIGHT HIP;  Surgeon: Bjorn Pippin, MD;  Location: [facility];  Service: Orthopedics;  Laterality: Right;   TONSILLECTOMY     age 43   Patient Active Problem List   Diagnosis Date Noted   Right spastic hemiplegia 12/14/2022   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   CAD in native artery 10/31/2022   Malnutrition of moderate degree 06/22/2022   Closed fracture of femur, neck 06/21/2022   Urinary frequency 06/21/2022   Hypokalemia 06/21/2022   Heart murmur 06/21/2022   Hypothyroidism 06/21/2022   Dysphagia 05/17/2022   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   CVA (cerebral vascular accident) 05/12/2022   Osteoporosis 01/24/2022   Atypical chest pain 07/19/2021   Temporomandibular joint (  TMJ) pain 07/16/2020   Referred otalgia of both ears 07/16/2020   PAC (premature atrial contraction) 05/11/2020   PVC (premature ventricular contraction) 05/11/2020   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   Carotid bruit 11/13/2019   Pure hypercholesterolemia 11/13/2019   Malignant neoplasm of upper-outer quadrant of right breast in female, estrogen receptor positive 05/22/2017   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   Diplopia 09/17/2014   Dizziness and giddiness 09/17/2014   Essential hypertension 07/22/2014   Chest pain 07/22/2014   Family history of heart disease 07/22/2014    REFERRING DIAG:  R29.6 (ICD-10-CM) -  Repeated falls, closed fracture of Rt hip    THERAPY DIAG:  Muscle weakness (generalized)  Unsteadiness on feet  Other abnormalities of gait and mobility  Rationale for Evaluation and Treatment Rehabilitation  PERTINENT HISTORY: hypertension, breast cancer, hyperlipidemia, Lt hip fracture secondary to fall 06/2022, CVA 06/2022   PRECAUTIONS:  Fall, Rt breast cancer with node removal, osteoporosis    SUBJECTIVE:                                                                                                                                                                                      SUBJECTIVE STATEMENT:  I get injections for my Rt arm tomorrow.     PAIN:  Are you having pain? Not right now but my right hand and arm can be very sore at times.    OBJECTIVE: (objective measures completed at initial evaluation unless otherwise dated)  DIAGNOSTIC FINDINGS: Rt hip fracture and ORIF     COGNITION: Overall cognitive status: Within functional limits for tasks assessed                         SENSATION: WFL     POSTURE: rounded shoulders, forward head, flexed trunk , and weight shift left   PALPATION: NA   LOWER EXTREMITY ROM: Rt hip limited by 50%, hamstrings limited by 50% bil.    LOWER EXTREMITY MMT:   MMT Right eval Right  09/27/22 Left eval Right 10/30/22 Left 10/30/22  Hip flexion 3+ 4- 4- 4- 4-  Hip extension         Hip abduction 3-  4    Hip adduction         Hip internal rotation         Hip external rotation         Knee flexion 3- 4- 3+    Knee extension 3+ 4 4- 4 painful hip 4-  Ankle dorsiflexion 3+ 4- 4- 4- 4  Ankle plantarflexion         Ankle inversion  Ankle eversion          (Blank rows = not tested)   FUNCTIONAL TESTS:   Eval: Timed up and go (TUG): 1 min, 27 seconds   09/27/22: 5x sit to stand: 29 seconds with use of Lt hand  TUG: 27 seconds   10/30/22: 5x sit to stand: 23 sec use of LT hand     TUG: 26 sec with standard  cane   11/20/22: 5x sit to stand: 18.68 seconds    TUG: 19.45 without device    3 minute walk test: 225 feet with a standard cane   01/10/23: 5x sit to stand: 15.77 seconds  GAIT: Distance walked: 25 feet  Assistive device utilized: Hemi walker on Lt Level of assistance: SBA Comments: slow mobility, reduced trunk rotation, reduce Rt LE stance time and reduced Rt arm swing     TODAY'S TREATMENT:    01/10/23: Nustep L3 x 8 min with PT present to monitor and discuss progress - legs only- instructed pt to keep SPM >35-40 Sit to stand: 2x10 holding 5# kettlebell on Lt LAQ 2.5# Rt 2x10 Tandem stance: Rt and Lt- next to barre 3x15 seconds each standing on balance pad Standing on level surface: holding blue weighted ball: D2 diagonals x10 each, standing on balance pad, same UE movement x10 Standing on balance pad: alternating arm motion to promote trunk rotation and weight shift Stepping over hurdles at barre: forward and lateral, no UE support today with forward over hurdles  Gait with cane- good step symmetry and use of cane  01/03/23: Nustep L3 x 8 min with PT present to monitor and discuss progress - legs only- instructed pt to keep SPM >30  Sit to stand: 2x10 holding 5# kettlebell on Lt LAQ 2.5# Rt 2x10 Tandem stance: Rt and Lt- next to barre 3x10 seconds each standing on balance pad Standing on level surface: holding blue weighted ball: D2 diagonals x10 each, standing on balance pad, same UE movement x10 Standing on balance pad: alternating arm motion to promote trunk rotation and weight shift Green loop around thighs: sidestepping at barre with mini squat and UE support 3 laps, Stepping over hurdles at barre: forward and lateral, no UE support today with forward over hurdles  Gait with cane- good step symmetry and use of cane  12/26/22: Nustep L3 x 8 min with PT present to monitor and discuss progress - legs only  Sit to stand: x10 without UE support LAQ 2# Rt 2x10 Tandem stance: Rt  and Lt- next to barre 3x10 seconds each Standing on level surface: holding blue weighted ball: D2 diagonals x10 each, standing on balance pad, same UE movement x10 Standing on balance pad: alternating arm motion to promote trunk rotation and weight shift Green loop around thighs: sidestepping at barre with mini squat and UE support 3 laps, Standing marching 2x10 with 2# weight added-standing on balance pad Stepping over hurdles in // bars with Lt UE support only: forward and lateral, 4 laps each Gait with cane- good step symmetry and use of cane  12/20/22: Nustep L3 x 8 min with PT present to monitor and discuss progress - legs only  Sit to stand: x10 without UE support LAQ 2# Rt 2x10 Tandem stance: Rt and Lt- next to barre 3x10 seconds each Standing on level surface: holding blue weighted ball: D2 diagonals x10 each, standing on balance pad, same UE movement x10 Standing on balance pad: alternating arm motion to promote trunk rotation and weight shift Green  loop around thighs: sidestepping at barre with mini squat and UE support 3 laps,  Standing marching 2x10 with 2# weight added-standing on balance pad 6" step ups on Lt x10, Rt x5 Gait with small base quad cane: around clinic x 5 minute    PATIENT EDUCATION:  Education details: Access Code: [email or web address] Person educated: Patient Education method: Explanation, Demonstration, and Handouts Education comprehension: verbalized understanding and returned demonstration   HOME EXERCISE PROGRAM: Access Code: [email or web address] URL: [email or web address] Date: 09/27/2022 Prepared by: Tresa Endo  Exercises - Seated Long Arc Quad  - 3 x daily - 7 x weekly - 2 sets - 10 reps - 5 hold - Seated March   - 3 x daily - 7 x weekly - 3 sets - 10 reps - Seated Heel Toe Raises   - 3 x daily - 7 x weekly - 2 sets - 10 reps - Seated Isometric Hip Adduction with Ball  - 3 x daily - 7 x weekly - 2 sets - 10 reps - Sit to Stand with Armchair  - 2 x daily - 7  x weekly - 2 sets - 5-10 reps - Standing Hip Abduction with Counter Support  - 1 x daily - 7 x weekly - 2 sets - 10 reps - Heel Raises with Counter Support  - 2 x daily - 7 x weekly - 2 sets - 10 reps  Patient Education - Walking with a Single [employer] - Modified 4 Point Gait Pattern  ASSESSMENT:   CLINICAL IMPRESSION:  Pt continues to use her quad cane at home and is safe with this.  Pt had advanced to higher level tasks with additional weight and challenge in the clinic.  She was able to keep SPM > 35 on the NuStep.  5x sit to stand was improved to 15.77 seconds today, indicating reduced falls risk.  Pt was able to negotiate hurdles at [facility] without UE support today. Pt reports improved endurance and confidence with balance tasks at home and in the community.  Patient will benefit from skilled PT to address the below impairments and improve overall function.   OBJECTIVE IMPAIRMENTS: Abnormal gait, decreased activity tolerance, decreased balance, decreased mobility, difficulty walking, decreased strength, decreased safety awareness, impaired perceived functional ability, impaired flexibility, postural dysfunction, and pain.    ACTIVITY LIMITATIONS: carrying, lifting, sitting, standing, stairs, transfers, hygiene/grooming, and locomotion level   PARTICIPATION LIMITATIONS: meal prep, cleaning, laundry, driving, shopping, and community activity   PERSONAL FACTORS: Age, Past/current experiences, and 1-2 comorbidities: CVA, falls, Rt hip fracture with ORIF  are also affecting patient's functional outcome.    REHAB POTENTIAL: Good   CLINICAL DECISION MAKING: Evolving/moderate complexity   EVALUATION COMPLEXITY: Moderate     GOALS: Goals reviewed with patient? Yes   SHORT TERM GOALS: Target date: 09/06/2022   Be independent in initial HEP Baseline: Goal status: Goal met 08/14/22   2.  Improve LE strength to perform sit to stand with moderate Lt UE support Baseline:  Goal status: Goal  met 08/28/22  3.  Perform TUG in < or = to 60 seconds to reduce falls risk Baseline: 27 seconds (09/27/22) Goal status: MET      LONG TERM GOALS: Target date:  01/19/23   Be independent in advanced HEP Baseline: independent in current HEP and further progress is needed  Goal status: in progress    2.  Perform TUG in < or = to 15 seconds to reduce falls risk Baseline: 19.45 seconds (11/20/22) Goal status: In  progress    3.  ambulate > or = to 350 feet in 3 minutes to improve community ambulation and independence  Baseline: 225 (11/20/22) Goal status: NEW    4.  Ambulate with hemi walker with supervision or no guard due to improved gait Baseline: able to do this Goal status: MET   5.  perform 5x sit to stand in < or = to 14 seconds to reduce falls risk  Baseline: 15.77 seconds (01/10/23)  Goal Status: In progress   6. Ambulate with cane for all distances and demonstrate independence due to improved stability  Baseline: using hemiwalker and will get cane soon.  Walked well with standard cane in the clinic  (11/20/22)  Goal status: INITIAL     PLAN:   PT FREQUENCY: 1-2x/week   PT DURATION: 8 weeks   PLANNED INTERVENTIONS: Therapeutic exercises, Therapeutic activity, Neuromuscular re-education, Balance training, Gait training, Patient/Family education, Self Care, Joint mobilization, Stair training, Dry Needling, Electrical stimulation, Cryotherapy, Moist heat, Taping, Manual therapy, and Re-evaluation   PLAN FOR NEXT SESSION: Continue to work on gait , strength and balance. Request date extension before 01/19/23  Lorrene Reid, PT 01/10/23 3:29 PM

## 2023-01-11 ENCOUNTER — Encounter: Payer: Medicare PPO | Attending: Physical Medicine & Rehabilitation | Admitting: Physical Medicine & Rehabilitation

## 2023-01-11 ENCOUNTER — Encounter: Payer: Self-pay | Admitting: Physical Medicine & Rehabilitation

## 2023-01-11 VITALS — BP 125/67 | HR 57 | Ht 62.0 in | Wt 100.0 lb

## 2023-01-11 DIAGNOSIS — G8111 Spastic hemiplegia affecting right dominant side: Secondary | ICD-10-CM | POA: Insufficient documentation

## 2023-01-11 MED ORDER — ONABOTULINUMTOXINA 100 UNITS IJ SOLR
200.0000 [IU] | Freq: Once | INTRAMUSCULAR | Status: AC
  Administered 2023-01-11: 200 [IU] via INTRAMUSCULAR

## 2023-01-11 MED ORDER — SODIUM CHLORIDE (PF) 0.9 % IJ SOLN
4.0000 mL | Freq: Once | INTRAMUSCULAR | Status: AC
  Administered 2023-01-11: 4 mL via INTRAVENOUS

## 2023-01-11 NOTE — Patient Instructions (Signed)

## 2023-01-11 NOTE — Progress Notes (Signed)
RUE Botox Injection for spasticity using needle EMG guidance  Dilution: 50 Units/ml Indication: Severe spasticity which interferes with ADL,mobility and/or  hygiene and is unresponsive to medication management and other conservative care Informed consent was obtained after describing risks and benefits of the procedure with the patient. This includes bleeding, bruising, infection, excessive weakness, or medication side effects. A REMS form is on file and signed. Needle: 27g 1" needle electrode Number of units per muscle  Right  Pronator 50U FDS 25 FDP 25 FCR 25 Biceps 75 All injections were done after obtaining appropriate EMG activity and after negative drawback for blood. The patient tolerated the procedure well. Post procedure instructions were given. A followup appointment was made.

## 2023-01-15 ENCOUNTER — Ambulatory Visit: Payer: Medicare PPO | Admitting: Occupational Therapy

## 2023-01-15 DIAGNOSIS — M25611 Stiffness of right shoulder, not elsewhere classified: Secondary | ICD-10-CM | POA: Diagnosis not present

## 2023-01-15 DIAGNOSIS — M79601 Pain in right arm: Secondary | ICD-10-CM

## 2023-01-15 DIAGNOSIS — R2681 Unsteadiness on feet: Secondary | ICD-10-CM | POA: Diagnosis not present

## 2023-01-15 DIAGNOSIS — M6281 Muscle weakness (generalized): Secondary | ICD-10-CM | POA: Diagnosis not present

## 2023-01-15 DIAGNOSIS — I69354 Hemiplegia and hemiparesis following cerebral infarction affecting left non-dominant side: Secondary | ICD-10-CM | POA: Diagnosis not present

## 2023-01-15 DIAGNOSIS — R2689 Other abnormalities of gait and mobility: Secondary | ICD-10-CM | POA: Diagnosis not present

## 2023-01-15 DIAGNOSIS — R278 Other lack of coordination: Secondary | ICD-10-CM | POA: Diagnosis not present

## 2023-01-15 NOTE — Therapy (Signed)
OUTPATIENT OCCUPATIONAL THERAPY  Treatment Session   Patient Name: Angela Buchanan MRN: [id number] DOB:Jul 01, 1951, 72 y.o., female Today's Date: 01/15/2023  PCP: Shon Hale, MD REFERRING PROVIDER: Shon Hale, MD       END OF SESSION:  OT End of Session - 01/15/23 1322     Visit Number 24    Number of Visits 41    Date for OT Re-Evaluation 02/23/23    Authorization Type [employer]    OT Start Time 1320    OT Stop Time 1400    OT Time Calculation (min) 40 min                                 Past Medical History:  Diagnosis Date   Atypical chest pain 07/19/2021   Bigeminy    no current med.   Breast cancer 06/2017   right   Bruises easily    CAD in native artery 10/31/2022   Carotid bruit 11/13/2019   Dental crowns present    History of diverticulitis    Hypertension    states under control with med., has been on med. x 5 yr.   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   Past Surgical History:  Procedure Laterality Date   ABDOMINAL HYSTERECTOMY     partial   BREAST LUMPECTOMY Right 06/22/2017   Malignant   BREAST LUMPECTOMY WITH RADIOACTIVE SEED AND SENTINEL LYMPH NODE BIOPSY Right 06/22/2017   Procedure: RIGHT BREAST LUMPECTOMY WITH RADIOACTIVE SEED AND RIGHT AXILLARY DEEP SENTINEL LYMPH NODE BIOPSY WITH BLUE DYE INJECTION;  Surgeon: Claud Kelp, MD;  Location: [facility];  Service: General;  Laterality: Right;   BREAST LUMPECTOMY WITH RADIOACTIVE SEED LOCALIZATION Left 06/22/2017   Procedure: LEFT BREAST LUMPECTOMY WITH RADIOACTIVE SEED LOCALIZATION;  Surgeon: Claud Kelp, MD;  Location:  SURGERY CENTER;  Service: General;  Laterality:  Left;   CATARACT EXTRACTION W/ INTRAOCULAR LENS  IMPLANT, BILATERAL Bilateral    EXCISION OF BREAST BIOPSY Left 06/22/2017   benign   LOOP RECORDER INSERTION N/A 05/16/2022   Procedure: LOOP RECORDER INSERTION;  Surgeon: Lanier Prude, MD;  Location: [facility];  Service: Cardiovascular;  Laterality: N/A;   PERCUTANEOUS PINNING Right 06/22/2022   Procedure: PERCUTANEOUS PINNING OF RIGHT HIP;  Surgeon: Bjorn Pippin, MD;  Location: [facility];  Service: Orthopedics;  Laterality: Right;   TONSILLECTOMY     age 59   Patient Active Problem List   Diagnosis Date Noted   Right spastic hemiplegia 12/14/2022   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   CAD in native artery 10/31/2022   Malnutrition of moderate degree 06/22/2022   Closed fracture of femur, neck 06/21/2022   Urinary frequency 06/21/2022   Hypokalemia 06/21/2022   Heart murmur 06/21/2022   Hypothyroidism 06/21/2022   Dysphagia 05/17/2022   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   CVA (cerebral vascular accident) 05/12/2022   Osteoporosis 01/24/2022   Atypical chest pain 07/19/2021   Temporomandibular joint (TMJ) pain 07/16/2020   Referred otalgia of both ears 07/16/2020   PAC (premature atrial contraction) 05/11/2020   PVC (premature ventricular contraction) 05/11/2020   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   Carotid bruit 11/13/2019  Pure hypercholesterolemia 11/13/2019   Malignant neoplasm of upper-outer quadrant of right breast in female, estrogen receptor positive 05/22/2017   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   Diplopia 09/17/2014   Dizziness and giddiness 09/17/2014   Essential hypertension 07/22/2014   Chest pain 07/22/2014   Family history of heart disease 07/22/2014    ONSET DATE: CVA 05/12/22  REFERRING DIAG: M79.641 (ICD-10-CM) - Pain in right hand  THERAPY DIAG:  Muscle weakness (generalized)  Other lack of coordination  Hemiplegia and hemiparesis following cerebral infarction affecting  left non-dominant side  Pain in right arm  Stiffness of right shoulder, not elsewhere classified  Rationale for Evaluation and Treatment: Rehabilitation  SUBJECTIVE:   SUBJECTIVE STATEMENT: Pt reports that she is in so much pain after botox injections and is unable to move her arm to engage in any of her exercises.  Pt accompanied by: self and significant other (Husband, [name])  PERTINENT HISTORY: hypertension, breast cancer, hyperlipidemia, Lt hip fracture secondary to fall 06/2022, CVA 06/2022   PRECAUTIONS: Fall, Rt breast cancer with node removal, osteoporosis  WEIGHT BEARING RESTRICTIONS: No  PAIN:  Are you having pain? Yes: NPRS scale: 10/10 Pain location: whole arm Pain description: "feels like it is full of lead" Aggravating factors: certain movements Relieving factors: 2 tylenol +advil  FALLS: Has patient fallen in last 6 months? Yes. Number of falls 1 fall, one week after d/c home from SNF  LIVING ENVIRONMENT: Lives with: lives with their spouse Lives in: House/apartment Stairs: Yes: External: 2 steps; on left going up Has following equipment at home: Dan Humphreys - 2 wheeled, Hemi walker, Wheelchair (manual), shower chair, and Grab bars  PLOF: Independent and Independent with basic ADLs  PATIENT GOALS: to get use of my R arm again  OBJECTIVE:   HAND DOMINANCE: Right  ADLs: Transfers/ambulation related to ADLs: Mod I with hemi-walker Eating: using L hand for eating, husband is assisting with cutting up food Grooming: Mod I, using L hand UB Dressing: Mod assist LB Dressing: Max assist Toileting: Mod I Bathing: Supervision - Mod I Tub Shower transfers: Tourist information centre manager: Shower seat with back and Grab bars  UPPER EXTREMITY ROM:    Active ROM Right eval Left eval Right 10/18/22  Right 11/15/22 Right 12/18/22  Shoulder flexion 54   82 86  Shoulder abduction       Shoulder adduction       Shoulder extension       Shoulder internal rotation        Shoulder external rotation       Elbow flexion 108   132 136  Elbow extension -24   -12 -12  Wrist flexion    38 36  Wrist extension    18 24  Wrist ulnar deviation       Wrist radial deviation       Wrist pronation       Wrist supination       (Blank rows = not tested)  Passive ROM Right eval Left eval Right 10/18/22 Right 11/15/22 Right 12/18/22  Shoulder flexion 65  76 86 94  Shoulder abduction       Shoulder adduction       Shoulder extension       Shoulder internal rotation       Shoulder external rotation       Elbow flexion       Elbow extension -17  -15 -10 0  Wrist flexion 46  56 60 60  Wrist  extension 27  50 40 56  Wrist ulnar deviation       Wrist radial deviation       Wrist pronation       Wrist supination       (Blank rows = not tested)  Passive ROM Right 10/18/22 Right 11/15/22 Right 12/18/22 Right 01/08/23  Thumb MCP (0-60)      Thumb IP (0-80)      Thumb Radial abd/add (0-55)      Thumb Palmar abd/add (0-45)      Thumb Opposition to Small Finger      Index MCP (0-90)  0    Index PIP (0-100)  -5    Index DIP (0-70)   -3    Long MCP (0-90)       Long PIP (0-100) -30  -30 -30 -30  Long DIP (0-70) -20 -20 -5 -6  Ring MCP (0-90)       Ring PIP (0-100) -25  -21 -30 -24  Ring DIP (0-70) -15  -8 -7 -6  Little MCP (0-90)       Little PIP (0-100)   -18    Little DIP (0-70)   -4    (Blank rows = not tested)   UPPER EXTREMITY MMT:   not assessed due to pain with any PROM, AROM    HAND FUNCTION: Loose gross grasp  R gross grasp: 5#; L gross grasp: 34# 01/08/23: R: 6# gross grasp, lateral pinch 6# (L: 11#), tip to tip R: 2# (L: 8#)  COORDINATION: Box and Blocks:  Right 2blocks, Left 45 blocks. Modified to complete with RUE.  Pt able to grasp and move across to other side of box with barrier removed.   11/01/22: RUE 13 blocks 12/18/22: RUE 19 blocks  SENSATION: Mild decrease in R  EDEMA: mild edema in wrist hand, and fingers on RUE  OBSERVATIONS: Pt  guarded with movement, hypersensitive to touch and PROM.     TODAY'S TREATMENT:                                   01/15/23 Supine PROM/AAROM: engaged in PROM for bicep flexion/extension and shoulder abduction/adduction with focus on assessing ROM.  Pt reports increased pain and inability to activate any movement in RUE.  OT providing calming voice and touch to facilitate increased ROM.  Pt tolerating PROM to full flexion/extension at biceps with pain at end ranges.  Progressing to pt being able to flex and extend 75% of ROM.  Pt with pain with abduction at ~80*, however tolerating sustained hold at end range to facilitate increased ROM.  Pt with no AROM at shoulder abduction. Massage: OT providing gentle massage and therapeutic touch at biceps with focus on initial assessment for inflammation and then tolerance to touch and movement.  Pt with no increased heat or redness, other than right at site of injection.  Pt with initial decreased tolerance to touch, however with repetition pt tolerating increased touch and mild pressure during massage.    01/08/23 Quick Dash: 61.4% with continued reports of severe difficulty or unable to perform 6 of 11 tasks.  However down from 77%. Measurements taken (see above) with pt demonstrating improvements in PROM in R ring finger, however still demonstrating decreased extension of all digits, especially index and long finger.  Coordination: attempted 9 hole peg test, however pt with increased difficulty with picking up and manipulating small pegs, therefore  transitioned to larger peg.  Pt then demonstrating improved ease with picking up with either tip to tip or 3 jaw chuck pinch.  When pt utilizing long finger, index finger will flex out of the way requiring cues to open full hand prior to completion of task to incorporate index finger into task.  Pt initially having to pick up pegs from OT's palm then able to transition to picking up from container. Incorporated  functional reach with reaching across midline and forward to facilitate increased shoulder and elbow ROM.    12/27/22 Functional grasp/reach: engaged in picking up and stacking large foam blocks with R hand focusing on grasp and manipulation of blocks.  Pt demonstrating good gross grasp with RUE. OT incorporated colored pattern to facilitate increased sequencing and reaching outside BOS and across midline to facilitate increased elbow and shoulder ROM.  Pt leaning to L to compensate for decreased shoulder flexion as stacking higher, OT providing cues to draw attention to posture.  Pt unstacking blocks and placing them into box in low range to facilitate elbow extension and release of grasp. Grasp: 1,2# resistance clothespins with RUE for mid and high functional reaching and sustained pinch. Pt requiring increased time and effort to obtain grasp on clothespins and to release on vertical dowel.  Pt unable to grasp 2# clothespin therefore focused on hand placement and quality of grasp when placing and removing clothespins.  Pt demonstrating decreased incorporation of index finger into grasp, with cues pt able to increase attention to use of index finger and incorporate at ~50%.  Transitioned to pinch with forearm movement with supination and pronation to place clothespins on horizontal dowel.  Pt able to manage clothespins in both directions, however demonstrating increased difficulty with pronation.  Educated on functional carryover of supination/pronation and functional reach.   PATIENT EDUCATION: Education details: ongoing condition specific education and progressive exercises. Person educated: Patient and Spouse Education method: Explanation, Demonstration, Tactile cues, Verbal cues, and Handouts Education comprehension: verbalized understanding and needs further education  HOME EXERCISE PROGRAM: Access Code: [email or web address] URL: [email or web address] Date: 12/06/2022 Prepared by: [facility] -  Outpatient  Rehab - [facility]  Exercises - Supine Shoulder Flexion AAROM with Dowel  - 1 x daily - 3 x weekly - 2 sets - 10 reps - Supine Shoulder Protraction with Dowel  - 1 x daily - 3 x weekly - 2 sets - 10 reps - Supine Shoulder Abduction AAROM with Dowel  - 1 x daily - 3 x weekly - 2 sets - 10 reps - Touch finger to nose  - 1 x daily - 3 x weekly - 2 sets - 10 reps - 5-10 sec hold - Touch finger to nose  - 1 x daily - 3 x weekly - 2 sets - 10 reps - Hooklying Single Arm Chest Press  - 1 x daily - 3 x weekly - 2 sets - 10 reps - Thumb AROM Opposition  - 1 x daily - 7 x weekly - 1 sets - 5 reps - Seated Finger Composite Flexion Stretch  - 1 x daily - 7 x weekly - 1 sets - 5 reps - Hand PROM Finger Extension  - 1 x daily - 7 x weekly - 1 sets - 10 reps - Seated Forearm Pronation Supination AROM  - 1 x daily - 7 x weekly - 1 sets - 10 reps - Seated Wrist Flexion Extension PROM  - 1 x daily - 7 x weekly - 1 sets -  10 reps   GOALS: Goals reviewed with patient? Yes  SHORT TERM GOALS: Target date: 02/02/23  Pt and spouse will be independent with updated ROM and coordination HEP. Baseline: Goal status: IN Progress  2.  Pt will verbalize understanding of task modifications and/or potential AE needs to increase ease, safety, and independence w/ ADLs. Baseline: reinstated as pt to receive botox and will benefit from additional education with improved function Goal status: IN Progress    LONG TERM GOALS:  Target date: 02/23/23  1.  Pt will demonstrate ability to demonstrate improved functional grasp in RUE to open new jar, increased handwriting, and self-feeding. Baseline:  Goal status: IN Progress  2.  Pt will demonstrate improved UE functional use for ADLs as evidenced by increasing box/ blocks score by 5 blocks with RUE, to be completed in standardized manner. Baseline:  19 blocks with RUE Goal status: IN Progress  3.  Pt will demonstrate improved shoulder ROM to allow  for reach to moderate height as needed for ADLs/IADLs (washing back, putting on jacket, etc).  Baseline:  Goal status: IN Progress  4. Pt will demonstrate improvement of finger extension by 15* TAM of long and ringer finger to allow for more functional hand opening.  Baseline: -36 long finger, -30 ring finger  Goal status: IN Progress  5. Pt will demonstrate improved functional grasp by 5# with RUE to allow for increased independence with cutting foods and opening food containers.  Baseline: R: 6#  Goal status: IN Progress  ASSESSMENT:  CLINICAL IMPRESSION: Pt reporting excruciating pain in RUE, reporting decreased ROM, decreased ability to sleep due to increased pain and not wearing orthosis due to pain.  Pt tolerating progressive ROM and touch with therapeutic use of self for management of pain and tightness.  Pt expressing understanding of recommendation to focus on gentle PROM with caregiver facilitating until pt able to complete with AROM.  OT encouraged focus on RUE in extension vs guarded in flexion across chest with ambulation and when sleeping.  PERFORMANCE DEFICITS: in functional skills including ADLs, IADLs, coordination, sensation, edema, tone, ROM, strength, pain, flexibility, Fine motor control, Gross motor control, balance, body mechanics, endurance, decreased knowledge of use of DME, skin integrity, vision, and UE functional use, cognitive skills including emotional and safety awareness, and psychosocial skills including coping strategies.   IMPAIRMENTS: are limiting patient from ADLs, IADLs, and rest and sleep.   CO-MORBIDITIES: may have co-morbidities  that affects occupational performance. Patient will benefit from skilled OT to address above impairments and improve overall function.  MODIFICATION OR ASSISTANCE TO COMPLETE EVALUATION: Min-Moderate modification of tasks or assist with assess necessary to complete an evaluation.  OT OCCUPATIONAL PROFILE AND HISTORY: Detailed  assessment: Review of records and additional review of physical, cognitive, psychosocial history related to current functional performance.  CLINICAL DECISION MAKING: Moderate - several treatment options, min-mod task modification necessary  REHAB POTENTIAL: Good  EVALUATION COMPLEXITY: Moderate    PLAN:  OT FREQUENCY: 2x/week  OT DURATION: 6 weeks  PLANNED INTERVENTIONS: self care/ADL training, therapeutic exercise, therapeutic activity, neuromuscular re-education, manual therapy, passive range of motion, balance training, functional mobility training, splinting, paraffin, fluidotherapy, biofeedback, compression bandaging, moist heat, cryotherapy, contrast bath, patient/family education, visual/perceptual remediation/compensation, psychosocial skills training, coping strategies training, and DME and/or AE instructions  RECOMMENDED OTHER SERVICES: NA  CONSULTED AND AGREED WITH PLAN OF CARE: Patient and family member/caregiver  PLAN FOR NEXT SESSION: AAROM or self-ROM to include shoulder flexion/extension and internal/external rotation, elbow, forearm.  Functional grasp and release.  Attempt handwriting and/or self-feeding, fold laundry/paper.   Mckayla Mulcahey, OTR/L 01/15/2023, 1:22 PM

## 2023-01-16 ENCOUNTER — Encounter: Payer: Medicare PPO | Admitting: Occupational Therapy

## 2023-01-17 ENCOUNTER — Ambulatory Visit: Payer: Medicare PPO

## 2023-01-17 DIAGNOSIS — R2681 Unsteadiness on feet: Secondary | ICD-10-CM

## 2023-01-17 DIAGNOSIS — I69354 Hemiplegia and hemiparesis following cerebral infarction affecting left non-dominant side: Secondary | ICD-10-CM

## 2023-01-17 DIAGNOSIS — M6281 Muscle weakness (generalized): Secondary | ICD-10-CM

## 2023-01-17 DIAGNOSIS — R278 Other lack of coordination: Secondary | ICD-10-CM

## 2023-01-17 NOTE — Therapy (Addendum)
OUTPATIENT PHYSICAL THERAPY TREATMENT NOTE   Patient Name: Angela Buchanan MRN: [id number] DOB:Oct 11, 1950, 72 y.o., female Today's Date: 01/17/2023  PCP:  Garth Bigness, MD   REFERRING PROVIDER:  Garth Bigness, MD     Progress Note Reporting Period 11/13/22 to 01/17/23  See note below for Objective Data and Assessment of Progress/Goals.     END OF SESSION:   PT End of Session - 01/17/23 1531     Visit Number 20    Date for PT Re-Evaluation 03/19/23    Authorization Type 36 visit total (PT)    Authorization Time Period 11/8-4/18/24- extension requested    Authorization - Visit Number 19    Authorization - Number of Visits 36    Progress Note Due on Visit 30    PT Start Time 1446    PT Stop Time 1531    PT Time Calculation (min) 45 min    Activity Tolerance Patient tolerated treatment well    Behavior During Therapy WFL for tasks assessed/performed                             Past Medical History:  Diagnosis Date   Atypical chest pain 07/19/2021   Bigeminy    no current med.   Breast cancer 06/2017   right   Bruises easily    CAD in native artery 10/31/2022   Carotid bruit 11/13/2019   Dental crowns present    History of diverticulitis    Hypertension    states under control with med., has been on med. x 5 yr.   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   Past Surgical History:  Procedure Laterality Date   ABDOMINAL HYSTERECTOMY     partial   BREAST LUMPECTOMY Right 06/22/2017   Malignant   BREAST LUMPECTOMY WITH RADIOACTIVE SEED AND SENTINEL LYMPH NODE BIOPSY Right 06/22/2017   Procedure: RIGHT BREAST LUMPECTOMY WITH RADIOACTIVE SEED AND RIGHT AXILLARY DEEP SENTINEL LYMPH NODE BIOPSY  WITH BLUE DYE INJECTION;  Surgeon: Claud Kelp, MD;  Location: [facility];  Service: General;  Laterality: Right;   BREAST LUMPECTOMY WITH RADIOACTIVE SEED LOCALIZATION Left 06/22/2017   Procedure: LEFT BREAST LUMPECTOMY WITH RADIOACTIVE SEED LOCALIZATION;  Surgeon: Claud Kelp, MD;  Location: [facility];  Service: General;  Laterality: Left;   CATARACT EXTRACTION W/ INTRAOCULAR LENS  IMPLANT, BILATERAL Bilateral    EXCISION OF BREAST BIOPSY Left 06/22/2017   benign   LOOP RECORDER INSERTION N/A 05/16/2022   Procedure: LOOP RECORDER INSERTION;  Surgeon: Lanier Prude, MD;  Location: [facility];  Service: Cardiovascular;  Laterality: N/A;   PERCUTANEOUS PINNING Right 06/22/2022   Procedure: PERCUTANEOUS PINNING OF RIGHT HIP;  Surgeon: Bjorn Pippin, MD;  Location: [facility];  Service: Orthopedics;  Laterality: Right;   TONSILLECTOMY     age 31   Patient Active Problem List   Diagnosis Date Noted   Right spastic hemiplegia 12/14/2022   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   CAD in native artery 10/31/2022   Malnutrition of moderate degree 06/22/2022   Closed fracture of femur, neck 06/21/2022   Urinary frequency 06/21/2022   Hypokalemia 06/21/2022   Heart murmur 06/21/2022   Hypothyroidism  06/21/2022   Dysphagia 05/17/2022   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   CVA (cerebral vascular accident) 05/12/2022   Osteoporosis 01/24/2022   Atypical chest pain 07/19/2021   Temporomandibular joint (TMJ) pain 07/16/2020   Referred otalgia of both ears 07/16/2020   PAC (premature atrial contraction) 05/11/2020   PVC (premature ventricular contraction) 05/11/2020   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   Carotid bruit 11/13/2019   Pure hypercholesterolemia 11/13/2019   Malignant neoplasm of upper-outer quadrant of right breast in female, estrogen receptor positive 05/22/2017   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   Diplopia 09/17/2014    Dizziness and giddiness 09/17/2014   Essential hypertension 07/22/2014   Chest pain 07/22/2014   Family history of heart disease 07/22/2014    REFERRING DIAG:  R29.6 (ICD-10-CM) - Repeated falls, closed fracture of Rt hip    THERAPY DIAG:  Muscle weakness (generalized)  Unsteadiness on feet  Other lack of coordination  Hemiplegia and hemiparesis following cerebral infarction affecting left non-dominant side  Rationale for Evaluation and Treatment Rehabilitation  PERTINENT HISTORY: hypertension, breast cancer, hyperlipidemia, Lt hip fracture secondary to fall 06/2022, CVA 06/2022   PRECAUTIONS:  Fall, Rt breast cancer with node removal, osteoporosis    SUBJECTIVE:                                                                                                                                                                                      SUBJECTIVE STATEMENT:  I got botox in my Rt arm and it is worse now. I can barely do anything with my arm.  I'm frustrated.     PAIN:  Are you having pain? Not right now but my right hand and arm can be very sore at times.    OBJECTIVE: (objective measures completed at initial evaluation unless otherwise dated)  DIAGNOSTIC FINDINGS: Rt hip fracture and ORIF     COGNITION: Overall cognitive status: Within functional limits for tasks assessed                         SENSATION: WFL     POSTURE: rounded shoulders, forward head, flexed trunk , and weight shift left   PALPATION: NA   LOWER EXTREMITY ROM: Rt hip limited by 50%, hamstrings limited by 50% bil.    LOWER EXTREMITY MMT:   MMT Right eval Right  09/27/22 Left eval Right 10/30/22 Right  01/17/23 Left 10/30/22 Left 01/17/23  Hip flexion 3+ 4- 4- 4- 4 4- 4+  Hip extension           Hip abduction 3-  4    4  Hip adduction  Hip internal rotation           Hip external rotation           Knee flexion 3- 4- 3+  4  4+  Knee extension 3+ 4 4- 4 painful hip 4+ 4-  4+  Ankle dorsiflexion 3+ 4- 4- 4-  4   Ankle plantarflexion           Ankle inversion           Ankle eversion            (Blank rows = not tested)   FUNCTIONAL TESTS:   Eval: Timed up and go (TUG): 1 min, 27 seconds   09/27/22: 5x sit to stand: 29 seconds with use of Lt hand  TUG: 27 seconds   10/30/22: 5x sit to stand: 23 sec use of LT hand     TUG: 26 sec with standard cane   11/20/22: 5x sit to stand: 18.68 seconds    TUG: 19.45 without device    3 minute walk test: 225 feet with a standard cane   01/10/23: 5x sit to stand: 15.77 seconds   01/17/23: TUG with cane: 15.41 seconds with cane    3 min walk test: 313 feet with cane  GAIT: Distance walked: 25 feet  Assistive device utilized: Hemi walker on Lt Level of assistance: SBA Comments: slow mobility, reduced trunk rotation, reduce Rt LE stance time and reduced Rt arm swing     TODAY'S TREATMENT:    01/17/23: Nustep L3 x 8 min with PT present to monitor and discuss progress - legs only- instructed pt to keep SPM >35-40 Sit to stand: 2x10 holding 5# kettlebell on Lt LAQ 2.5# Rt 2x10 Tandem stance: Rt and Lt- next to barre 3x15 seconds each standing on balance pad Standing on level surface: holding blue weighted ball: D2 diagonals x10 each, standing on balance pad, same UE movement x10 Standing on balance pad: alternating arm motion to promote trunk rotation and weight shift Reassessment complete today Gait with cane- good step symmetry and use of cane 01/10/23: Nustep L3 x 8 min with PT present to monitor and discuss progress - legs only- instructed pt to keep SPM >35-40 Sit to stand: 2x10 holding 5# kettlebell on Lt LAQ 2.5# Rt 2x10 Tandem stance: Rt and Lt- next to barre 3x15 seconds each standing on balance pad Standing on level surface: holding blue weighted ball: D2 diagonals x10 each, standing on balance pad, same UE movement x10 Standing on balance pad: alternating arm motion to promote trunk rotation and weight  shift Stepping over hurdles at barre: forward and lateral, no UE support today with forward over hurdles  Gait with cane- good step symmetry and use of cane  01/03/23: Nustep L3 x 8 min with PT present to monitor and discuss progress - legs only- instructed pt to keep SPM >30  Sit to stand: 2x10 holding 5# kettlebell on Lt LAQ 2.5# Rt 2x10 Tandem stance: Rt and Lt- next to barre 3x10 seconds each standing on balance pad Standing on level surface: holding blue weighted ball: D2 diagonals x10 each, standing on balance pad, same UE movement x10 Standing on balance pad: alternating arm motion to promote trunk rotation and weight shift Green loop around thighs: sidestepping at barre with mini squat and UE support 3 laps, Stepping over hurdles at barre: forward and lateral, no UE support today with forward over hurdles  Gait with cane- good step symmetry and use of cane  PATIENT EDUCATION:  Education details: Access Code: [email or web address] Person educated: Patient Education method: Explanation, Facilities manager, and Handouts Education comprehension: verbalized understanding and returned demonstration   HOME EXERCISE PROGRAM: Access Code: [email or web address] URL: [email or web address] Date: 09/27/2022 Prepared by: Tresa Endo  Exercises - Seated Long Arc Quad  - 3 x daily - 7 x weekly - 2 sets - 10 reps - 5 hold - Seated March   - 3 x daily - 7 x weekly - 3 sets - 10 reps - Seated Heel Toe Raises   - 3 x daily - 7 x weekly - 2 sets - 10 reps - Seated Isometric Hip Adduction with Ball  - 3 x daily - 7 x weekly - 2 sets - 10 reps - Sit to Stand with Armchair  - 2 x daily - 7 x weekly - 2 sets - 5-10 reps - Standing Hip Abduction with Counter Support  - 1 x daily - 7 x weekly - 2 sets - 10 reps - Heel Raises with Counter Support  - 2 x daily - 7 x weekly - 2 sets - 10 reps  Patient Education - Walking with a Single [employer] - Modified 4 Point Gait Pattern  ASSESSMENT:   CLINICAL IMPRESSION:  Pt  had botox injection into Rt UE and arrived frustrated that she has reduced function and increased pain.  She has contacted the MD about this.    Pt had advanced to higher level tasks with additional weight and challenge in the clinic.  5x sit to stand was improved to 15.77 seconds last visit, indicating reduced falls risk. Today, TUG and 3 min walk test times improved and goals advanced.  LE strength is overall improved although still limited. Pt was able to negotiate hurdles at [facility] without UE support last session. Pt reports improved endurance and confidence with balance tasks at home and in the community.  Patient will benefit from skilled PT to address the below impairments and improve overall function.   OBJECTIVE IMPAIRMENTS: Abnormal gait, decreased activity tolerance, decreased balance, decreased mobility, difficulty walking, decreased strength, decreased safety awareness, impaired perceived functional ability, impaired flexibility, postural dysfunction, and pain.    ACTIVITY LIMITATIONS: carrying, lifting, sitting, standing, stairs, transfers, hygiene/grooming, and locomotion level   PARTICIPATION LIMITATIONS: meal prep, cleaning, laundry, driving, shopping, and community activity   PERSONAL FACTORS: Age, Past/current experiences, and 1-2 comorbidities: CVA, falls, Rt hip fracture with ORIF  are also affecting patient's functional outcome.    REHAB POTENTIAL: Good   CLINICAL DECISION MAKING: Evolving/moderate complexity   EVALUATION COMPLEXITY: Moderate     GOALS: Goals reviewed with patient? Yes   SHORT TERM GOALS: Target date: 09/06/2022   Be independent in initial HEP Baseline: Goal status: Goal met 08/14/22   2.  Improve LE strength to perform sit to stand with moderate Lt UE support Baseline:  Goal status: Goal met 08/28/22  3.  Perform TUG in < or = to 60 seconds to reduce falls risk Baseline: 27 seconds (09/27/22) Goal status: MET      LONG TERM GOALS: Target date:   03/19/23   Be independent in advanced HEP Baseline: independent in current HEP and further progress is needed  Goal status: in progress    2.  Perform TUG in < or = to 13 seconds to reduce falls risk Baseline: 15.41 seconds (01/17/23) Goal status: REVISED   3.  ambulate > or = to 375 feet in 3 minutes to improve community ambulation and independence  Baseline: 313 (  01/17/23) Goal status: REVISED    4.  Ambulate with hemi walker with supervision or no guard due to improved gait Baseline: able to do this Goal status: MET   5.  perform 5x sit to stand in < or = to 14 seconds to reduce falls risk  Baseline: 15.77 seconds (01/10/23)  Goal Status: In progress   6. Ambulate with cane for all distances and demonstrate independence due to improved stability  Baseline: using walker   Goal status: MET     PLAN:   PT FREQUENCY: 1x/week   PT DURATION: 8 weeks   PLANNED INTERVENTIONS: Therapeutic exercises, Therapeutic activity, Neuromuscular re-education, Balance training, Gait training, Patient/Family education, Self Care, Joint mobilization, Stair training, Dry Needling, Electrical stimulation, Cryotherapy, Moist heat, Taping, Manual therapy, and Re-evaluation   PLAN FOR NEXT SESSION: Continue to work on gait , strength and balance. Check on visit from cohere   Lorrene Reid, PT 01/17/23 3:33 PM

## 2023-01-18 ENCOUNTER — Ambulatory Visit: Payer: Medicare PPO | Admitting: Occupational Therapy

## 2023-01-19 ENCOUNTER — Telehealth: Payer: Self-pay | Admitting: *Deleted

## 2023-01-19 NOTE — Telephone Encounter (Signed)
Angela Buchanan called because since the botox she has been experiencing weakness(she cannot pull up her pants after going to the bathroom like she could before) and the pain makes it impossible to do her exercises at home. It was noted in OT visit that she was complaining of a lot of pain on 01/15/23.  She sees the OT again On Monday.  Her husband said that her hand and knuckles were turning blue. Angela Buchanan did not relate that but I questioned her and aske if her hand ws cold or nail beds blue. She denies. I advised her if the pain was bad she could try ice pack or heat to relieve. If condition should worsen she should go to Urgent Care or ED. Weakness can be expected after botox and will begin resolving as it wears off.  I have let her know that Dr Wynn Banker is out of the office today and will be able to respond.

## 2023-01-22 ENCOUNTER — Ambulatory Visit: Payer: Medicare PPO

## 2023-01-22 DIAGNOSIS — M25611 Stiffness of right shoulder, not elsewhere classified: Secondary | ICD-10-CM | POA: Diagnosis not present

## 2023-01-22 DIAGNOSIS — M79601 Pain in right arm: Secondary | ICD-10-CM | POA: Diagnosis not present

## 2023-01-22 DIAGNOSIS — R2681 Unsteadiness on feet: Secondary | ICD-10-CM

## 2023-01-22 DIAGNOSIS — R278 Other lack of coordination: Secondary | ICD-10-CM | POA: Diagnosis not present

## 2023-01-22 DIAGNOSIS — R2689 Other abnormalities of gait and mobility: Secondary | ICD-10-CM | POA: Diagnosis not present

## 2023-01-22 DIAGNOSIS — I69354 Hemiplegia and hemiparesis following cerebral infarction affecting left non-dominant side: Secondary | ICD-10-CM | POA: Diagnosis not present

## 2023-01-22 DIAGNOSIS — M6281 Muscle weakness (generalized): Secondary | ICD-10-CM

## 2023-01-22 NOTE — Telephone Encounter (Signed)
I spoke with her today and she denies any issue with her fingers turning blue.

## 2023-01-22 NOTE — Therapy (Signed)
OUTPATIENT PHYSICAL THERAPY TREATMENT NOTE   Patient Name: Angela Buchanan MRN: [id number] DOB:08-26-1951, 72 y.o., female Today's Date: 01/22/2023  PCP:  Garth Bigness, MD   REFERRING PROVIDER:  Garth Bigness, MD      END OF SESSION:   PT End of Session - 01/22/23 1447     Visit Number 21    Date for PT Re-Evaluation 03/19/23    Authorization Type 36 visit total (PT)    Authorization Time Period 11/8-4/18/24- extension requested    Authorization - Visit Number 21    Authorization - Number of Visits 36    Progress Note Due on Visit 30    PT Start Time 1358    PT Stop Time 1448    PT Time Calculation (min) 50 min    Activity Tolerance Patient tolerated treatment well    Behavior During Therapy [facility] for tasks assessed/performed                              Past Medical History:  Diagnosis Date   Atypical chest pain 07/19/2021   Bigeminy    no current med.   Breast cancer 06/2017   right   Bruises easily    CAD in native artery 10/31/2022   Carotid bruit 11/13/2019   Dental crowns present    History of diverticulitis    Hypertension    states under control with med., has been on med. x 5 yr.   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   Past Surgical History:  Procedure Laterality Date   ABDOMINAL HYSTERECTOMY     partial   BREAST LUMPECTOMY Right 06/22/2017   Malignant   BREAST LUMPECTOMY WITH RADIOACTIVE SEED AND SENTINEL LYMPH NODE BIOPSY Right 06/22/2017   Procedure: RIGHT BREAST LUMPECTOMY WITH RADIOACTIVE SEED AND RIGHT AXILLARY DEEP SENTINEL LYMPH NODE BIOPSY WITH BLUE DYE INJECTION;  Surgeon: Claud Kelp, MD;  Location: [facility];  Service: General;   Laterality: Right;   BREAST LUMPECTOMY WITH RADIOACTIVE SEED LOCALIZATION Left 06/22/2017   Procedure: LEFT BREAST LUMPECTOMY WITH RADIOACTIVE SEED LOCALIZATION;  Surgeon: Claud Kelp, MD;  Location: [facility];  Service: General;  Laterality: Left;   CATARACT EXTRACTION W/ INTRAOCULAR LENS  IMPLANT, BILATERAL Bilateral    EXCISION OF BREAST BIOPSY Left 06/22/2017   benign   LOOP RECORDER INSERTION N/A 05/16/2022   Procedure: LOOP RECORDER INSERTION;  Surgeon: Lanier Prude, MD;  Location: [facility];  Service: Cardiovascular;  Laterality: N/A;   PERCUTANEOUS PINNING Right 06/22/2022   Procedure: PERCUTANEOUS PINNING OF RIGHT HIP;  Surgeon: Bjorn Pippin, MD;  Location: [facility];  Service: Orthopedics;  Laterality: Right;   TONSILLECTOMY     age 48   Patient Active Problem List   Diagnosis Date Noted   Right spastic hemiplegia 12/14/2022   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   CAD in native artery 10/31/2022   Malnutrition of moderate degree 06/22/2022   Closed fracture of femur, neck 06/21/2022   Urinary frequency 06/21/2022   Hypokalemia 06/21/2022   Heart murmur 06/21/2022   Hypothyroidism 06/21/2022   Dysphagia 05/17/2022   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   CVA (cerebral vascular accident) 05/12/2022   Osteoporosis  01/24/2022   Atypical chest pain 07/19/2021   Temporomandibular joint (TMJ) pain 07/16/2020   Referred otalgia of both ears 07/16/2020   PAC (premature atrial contraction) 05/11/2020   PVC (premature ventricular contraction) 05/11/2020   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   Carotid bruit 11/13/2019   Pure hypercholesterolemia 11/13/2019   Malignant neoplasm of upper-outer quadrant of right breast in female, estrogen receptor positive 05/22/2017   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   Diplopia 09/17/2014   Dizziness and giddiness 09/17/2014   Essential hypertension 07/22/2014   Chest pain 07/22/2014   Family history of  heart disease 07/22/2014    REFERRING DIAG:  R29.6 (ICD-10-CM) - Repeated falls, closed fracture of Rt hip    THERAPY DIAG:  Muscle weakness (generalized)  Other lack of coordination  Unsteadiness on feet  Other abnormalities of gait and mobility  Rationale for Evaluation and Treatment Rehabilitation  PERTINENT HISTORY: hypertension, breast cancer, hyperlipidemia, Lt hip fracture secondary to fall 06/2022, CVA 06/2022   PRECAUTIONS:  Fall, Rt breast cancer with node removal, osteoporosis    SUBJECTIVE:                                                                                                                                                                                      SUBJECTIVE STATEMENT:  I have messaged with the MD.  Weakness is expected after Botox and should get better with time.      PAIN:  Are you having pain? Not right now but my right hand and arm can be very sore at times.    OBJECTIVE: (objective measures completed at initial evaluation unless otherwise dated)  DIAGNOSTIC FINDINGS: Rt hip fracture and ORIF     COGNITION: Overall cognitive status: Within functional limits for tasks assessed                         SENSATION: WFL     POSTURE: rounded shoulders, forward head, flexed trunk , and weight shift left   PALPATION: NA   LOWER EXTREMITY ROM: Rt hip limited by 50%, hamstrings limited by 50% bil.    LOWER EXTREMITY MMT:   MMT Right eval Right  09/27/22 Left eval Right 10/30/22 Right  01/17/23 Left 10/30/22 Left 01/17/23  Hip flexion 3+ 4- 4- 4- 4 4- 4+  Hip extension           Hip abduction 3-  4    4  Hip adduction           Hip internal rotation           Hip external rotation  Knee flexion 3- 4- 3+  4  4+  Knee extension 3+ 4 4- 4 painful hip 4+ 4- 4+  Ankle dorsiflexion 3+ 4- 4- 4-  4   Ankle plantarflexion           Ankle inversion           Ankle eversion            (Blank rows = not tested)   FUNCTIONAL  TESTS:   Eval: Timed up and go (TUG): 1 min, 27 seconds   09/27/22: 5x sit to stand: 29 seconds with use of Lt hand  TUG: 27 seconds   10/30/22: 5x sit to stand: 23 sec use of LT hand     TUG: 26 sec with standard cane   11/20/22: 5x sit to stand: 18.68 seconds    TUG: 19.45 without device    3 minute walk test: 225 feet with a standard cane   01/10/23: 5x sit to stand: 15.77 seconds   01/17/23: TUG with cane: 15.41 seconds with cane    3 min walk test: 313 feet with cane  GAIT: Distance walked: 25 feet  Assistive device utilized: Hemi walker on Lt Level of assistance: SBA Comments: slow mobility, reduced trunk rotation, reduce Rt LE stance time and reduced Rt arm swing     TODAY'S TREATMENT:    01/22/23: Nustep L4 x 8 min with PT present to monitor and discuss progress - legs only- instructed pt to keep SPM >35-40 Sit to stand: 2x10 holding 5# kettlebell on Lt LAQ 2.5# Rt 2x10, ER 2.5# added 2x10 Tandem stance: Rt and Lt- next to barre 3x15 seconds each standing on balance pad Negotiating hurdles and cobble blocks with gait belt and min UE support by PT.  Step to pattern Standing on balance pad: alternating arm motion to promote trunk rotation and weight shift Gait with cane- good step symmetry and use of cane- around building  01/17/23: Nustep L3 x 8 min with PT present to monitor and discuss progress - legs only- instructed pt to keep SPM >35-40 Sit to stand: 2x10 holding 5# kettlebell on Lt LAQ 2.5# Rt 2x10 Tandem stance: Rt and Lt- next to barre 3x15 seconds each standing on balance pad Standing on level surface: holding blue weighted ball: D2 diagonals x10 each, standing on balance pad, same UE movement x10 Standing on balance pad: alternating arm motion to promote trunk rotation and weight shift Reassessment complete today Gait with cane- good step symmetry and use of cane 01/10/23: Nustep L3 x 8 min with PT present to monitor and discuss progress - legs only- instructed  pt to keep SPM >35-40 Sit to stand: 2x10 holding 5# kettlebell on Lt LAQ 2.5# Rt 2x10 Tandem stance: Rt and Lt- next to barre 3x15 seconds each standing on balance pad Standing on level surface: holding blue weighted ball: D2 diagonals x10 each, standing on balance pad, same UE movement x10 Standing on balance pad: alternating arm motion to promote trunk rotation and weight shift Stepping over hurdles at barre: forward and lateral, no UE support today with forward over hurdles  Gait with cane- good step symmetry and use of cane   PATIENT EDUCATION:  Education details: Access Code: [email or web address] Person educated: Patient Education method: Explanation, Demonstration, and Handouts Education comprehension: verbalized understanding and returned demonstration   HOME EXERCISE PROGRAM: Access Code: [email or web address] URL: [email or web address] Date: 09/27/2022 Prepared by: Tresa Endo  Exercises - Seated Long Arc Quad  - 3 x daily - 7  x weekly - 2 sets - 10 reps - 5 hold - Seated March   - 3 x daily - 7 x weekly - 3 sets - 10 reps - Seated Heel Toe Raises   - 3 x daily - 7 x weekly - 2 sets - 10 reps - Seated Isometric Hip Adduction with Ball  - 3 x daily - 7 x weekly - 2 sets - 10 reps - Sit to Stand with Armchair  - 2 x daily - 7 x weekly - 2 sets - 5-10 reps - Standing Hip Abduction with Counter Support  - 1 x daily - 7 x weekly - 2 sets - 10 reps - Heel Raises with Counter Support  - 2 x daily - 7 x weekly - 2 sets - 10 reps  Patient Education - Walking with a Single [employer] - Modified 4 Point Gait Pattern  ASSESSMENT:   CLINICAL IMPRESSION:    Pt has advanced to higher level tasks with additional weight and challenge in the clinic.  Today, TUG and 3 min walk test times improved last week.  LE strength is overall improved although still limited. Pt did well with advancement of balance tasks today with hurdles and cobble blocks. Pt reports improved endurance and confidence with balance  tasks at home and in the community.  Patient will benefit from skilled PT to address the below impairments and improve overall function.   OBJECTIVE IMPAIRMENTS: Abnormal gait, decreased activity tolerance, decreased balance, decreased mobility, difficulty walking, decreased strength, decreased safety awareness, impaired perceived functional ability, impaired flexibility, postural dysfunction, and pain.    ACTIVITY LIMITATIONS: carrying, lifting, sitting, standing, stairs, transfers, hygiene/grooming, and locomotion level   PARTICIPATION LIMITATIONS: meal prep, cleaning, laundry, driving, shopping, and community activity   PERSONAL FACTORS: Age, Past/current experiences, and 1-2 comorbidities: CVA, falls, Rt hip fracture with ORIF  are also affecting patient's functional outcome.    REHAB POTENTIAL: Good   CLINICAL DECISION MAKING: Evolving/moderate complexity   EVALUATION COMPLEXITY: Moderate     GOALS: Goals reviewed with patient? Yes   SHORT TERM GOALS: Target date: 09/06/2022   Be independent in initial HEP Baseline: Goal status: Goal met 08/14/22   2.  Improve LE strength to perform sit to stand with moderate Lt UE support Baseline:  Goal status: Goal met 08/28/22  3.  Perform TUG in < or = to 60 seconds to reduce falls risk Baseline: 27 seconds (09/27/22) Goal status: MET      LONG TERM GOALS: Target date:  03/19/23   Be independent in advanced HEP Baseline: independent in current HEP and further progress is needed  Goal status: in progress    2.  Perform TUG in < or = to 13 seconds to reduce falls risk Baseline: 15.41 seconds (01/17/23) Goal status: REVISED   3.  ambulate > or = to 375 feet in 3 minutes to improve community ambulation and independence  Baseline: 313 (01/17/23) Goal status: REVISED    4.  Ambulate with hemi walker with supervision or no guard due to improved gait Baseline: able to do this Goal status: MET   5.  perform 5x sit to stand in < or =  to 14 seconds to reduce falls risk  Baseline: 15.77 seconds (01/10/23)  Goal Status: In progress   6. Ambulate with cane for all distances and demonstrate independence due to improved stability  Baseline: using walker   Goal status: MET     PLAN:   PT FREQUENCY: 1x/week  PT DURATION: 8 weeks   PLANNED INTERVENTIONS: Therapeutic exercises, Therapeutic activity, Neuromuscular re-education, Balance training, Gait training, Patient/Family education, Self Care, Joint mobilization, Stair training, Dry Needling, Electrical stimulation, Cryotherapy, Moist heat, Taping, Manual therapy, and Re-evaluation   PLAN FOR NEXT SESSION: Continue to work on gait , strength and balance. Check on visit from cohere   Lorrene Reid, PT 01/22/23 2:49 PM

## 2023-01-23 DIAGNOSIS — Z8673 Personal history of transient ischemic attack (TIA), and cerebral infarction without residual deficits: Secondary | ICD-10-CM | POA: Diagnosis not present

## 2023-01-23 DIAGNOSIS — H5000 Unspecified esotropia: Secondary | ICD-10-CM | POA: Diagnosis not present

## 2023-01-23 DIAGNOSIS — H50011 Monocular esotropia, right eye: Secondary | ICD-10-CM | POA: Diagnosis not present

## 2023-01-23 DIAGNOSIS — H5021 Vertical strabismus, right eye: Secondary | ICD-10-CM | POA: Diagnosis not present

## 2023-01-23 DIAGNOSIS — H532 Diplopia: Secondary | ICD-10-CM | POA: Diagnosis not present

## 2023-01-25 ENCOUNTER — Ambulatory Visit: Payer: Medicare PPO | Attending: Family Medicine | Admitting: Occupational Therapy

## 2023-01-25 ENCOUNTER — Ambulatory Visit (INDEPENDENT_AMBULATORY_CARE_PROVIDER_SITE_OTHER): Payer: Medicare PPO

## 2023-01-25 DIAGNOSIS — I639 Cerebral infarction, unspecified: Secondary | ICD-10-CM | POA: Diagnosis not present

## 2023-01-25 DIAGNOSIS — R278 Other lack of coordination: Secondary | ICD-10-CM | POA: Diagnosis not present

## 2023-01-25 DIAGNOSIS — M25611 Stiffness of right shoulder, not elsewhere classified: Secondary | ICD-10-CM | POA: Insufficient documentation

## 2023-01-25 DIAGNOSIS — I69354 Hemiplegia and hemiparesis following cerebral infarction affecting left non-dominant side: Secondary | ICD-10-CM | POA: Insufficient documentation

## 2023-01-25 DIAGNOSIS — M79601 Pain in right arm: Secondary | ICD-10-CM | POA: Insufficient documentation

## 2023-01-25 DIAGNOSIS — M6281 Muscle weakness (generalized): Secondary | ICD-10-CM | POA: Diagnosis not present

## 2023-01-25 NOTE — Therapy (Signed)
OUTPATIENT OCCUPATIONAL THERAPY  Treatment Session   Patient Name: Angela Buchanan MRN: [id number] DOB:Feb 13, 1951, 72 y.o., female Today's Date: 01/25/2023  PCP: Shon Hale, MD REFERRING PROVIDER: Shon Hale, MD       END OF SESSION:                        Past Medical History:  Diagnosis Date   Atypical chest pain 07/19/2021   Bigeminy    no current med.   Breast cancer 06/2017   right   Bruises easily    CAD in native artery 10/31/2022   Carotid bruit 11/13/2019   Dental crowns present    History of diverticulitis    Hypertension    states under control with med., has been on med. x 5 yr.   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   Past Surgical History:  Procedure Laterality Date   ABDOMINAL HYSTERECTOMY     partial   BREAST LUMPECTOMY Right 06/22/2017   Malignant   BREAST LUMPECTOMY WITH RADIOACTIVE SEED AND SENTINEL LYMPH NODE BIOPSY Right 06/22/2017   Procedure: RIGHT BREAST LUMPECTOMY WITH RADIOACTIVE SEED AND RIGHT AXILLARY DEEP SENTINEL LYMPH NODE BIOPSY WITH BLUE DYE INJECTION;  Surgeon: Claud Kelp, MD;  Location: [facility];  Service: General;  Laterality: Right;   BREAST LUMPECTOMY WITH RADIOACTIVE SEED LOCALIZATION Left 06/22/2017   Procedure: LEFT BREAST LUMPECTOMY WITH RADIOACTIVE SEED LOCALIZATION;  Surgeon: Claud Kelp, MD;  Location: [facility];  Service: General;  Laterality: Left;   CATARACT EXTRACTION W/ INTRAOCULAR LENS  IMPLANT, BILATERAL Bilateral    EXCISION OF BREAST BIOPSY Left 06/22/2017   benign   LOOP RECORDER INSERTION N/A 05/16/2022   Procedure: LOOP RECORDER INSERTION;  Surgeon: Lanier Prude, MD;   Location: [facility];  Service: Cardiovascular;  Laterality: N/A;   PERCUTANEOUS PINNING Right 06/22/2022   Procedure: PERCUTANEOUS PINNING OF RIGHT HIP;  Surgeon: Bjorn Pippin, MD;  Location: [facility];  Service: Orthopedics;  Laterality: Right;   TONSILLECTOMY     age 67   Patient Active Problem List   Diagnosis Date Noted   Right spastic hemiplegia 12/14/2022   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   CAD in native artery 10/31/2022   Malnutrition of moderate degree 06/22/2022   Closed fracture of femur, neck 06/21/2022   Urinary frequency 06/21/2022   Hypokalemia 06/21/2022   Heart murmur 06/21/2022   Hypothyroidism 06/21/2022   Dysphagia 05/17/2022   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   CVA (cerebral vascular accident) 05/12/2022   Osteoporosis 01/24/2022   Atypical chest pain 07/19/2021   Temporomandibular joint (TMJ) pain 07/16/2020   Referred otalgia of both ears 07/16/2020   PAC (premature atrial contraction) 05/11/2020   PVC (premature ventricular contraction) 05/11/2020   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  Rationale for Evaluation and Treatment: Rehabilitation  SUBJECTIVE:   SUBJECTIVE STATEMENT: Pt reports that she feels like she has lost all of the progress that she has made (after botox). Pt accompanied by: self and significant other (Husband, [name])  PERTINENT HISTORY: hypertension, breast cancer, hyperlipidemia, Lt hip fracture secondary to fall  06/2022, CVA 06/2022   PRECAUTIONS: Fall, Rt breast cancer with node removal, osteoporosis  WEIGHT BEARING RESTRICTIONS: No  PAIN:  Are you having pain? Yes: NPRS scale: 5-6/10 Pain location: whole arm Pain description: constant Aggravating factors: certain movements Relieving factors: advil  FALLS: Has patient fallen in last 6 months? Yes. Number of falls 1 fall, one week after d/c home from SNF  LIVING ENVIRONMENT: Lives with: lives with their spouse Lives in: House/apartment Stairs: Yes: External: 2 steps; on left going up Has following equipment at home: Dan Humphreys - 2 wheeled, Hemi walker, Wheelchair (manual), shower chair, and Grab bars  PLOF: Independent and Independent with basic ADLs  PATIENT GOALS: to get use of my R arm again  OBJECTIVE:   HAND DOMINANCE: Right  ADLs: Transfers/ambulation related to ADLs: Mod I with hemi-walker Eating: using L hand for eating, husband is assisting with cutting up food Grooming: Mod I, using L hand UB Dressing: Mod assist LB Dressing: Max assist Toileting: Mod I Bathing: Supervision - Mod I Tub Shower transfers: Tourist information centre manager: Shower seat with back and Grab bars  UPPER EXTREMITY ROM:    Active ROM Right eval Left eval Right 10/18/22  Right 11/15/22 Right 12/18/22  Shoulder flexion 54   82 86  Shoulder abduction       Shoulder adduction       Shoulder extension       Shoulder internal rotation       Shoulder external rotation       Elbow flexion 108   132 136  Elbow extension -24   -12 -12  Wrist flexion    38 36  Wrist extension    18 24  Wrist ulnar deviation       Wrist radial deviation       Wrist pronation       Wrist supination       (Blank rows = not tested)  Passive ROM Right eval Left eval Right 10/18/22 Right 11/15/22 Right 12/18/22  Shoulder flexion 65  76 86 94  Shoulder abduction       Shoulder adduction       Shoulder extension       Shoulder internal rotation       Shoulder external  rotation       Elbow flexion       Elbow extension -17  -15 -10 0  Wrist flexion 46  56 60 60  Wrist extension 27  50 40 56  Wrist ulnar deviation       Wrist radial deviation       Wrist pronation       Wrist supination       (Blank rows = not tested)  Passive ROM Right 10/18/22 Right 11/15/22 Right 12/18/22 Right 01/08/23  Thumb MCP (0-60)      Thumb IP (0-80)      Thumb Radial abd/add (0-55)      Thumb Palmar abd/add (0-45)      Thumb Opposition to Small Finger      Index MCP (0-90)  0    Index PIP (0-100)  -  5    Index DIP (0-70)   -3    Long MCP (0-90)       Long PIP (0-100) -30  -30 -30 -30  Long DIP (0-70) -20 -20 -5 -6  Ring MCP (0-90)       Ring PIP (0-100) -25  -21 -30 -24  Ring DIP (0-70) -15  -8 -7 -6  Little MCP (0-90)       Little PIP (0-100)   -18    Little DIP (0-70)   -4    (Blank rows = not tested)   UPPER EXTREMITY MMT:   not assessed due to pain with any PROM, AROM    HAND FUNCTION: Loose gross grasp  R gross grasp: 5#; L gross grasp: 34# 01/08/23: R: 6# gross grasp, lateral pinch 6# (L: 11#), tip to tip R: 2# (L: 8#)  COORDINATION: Box and Blocks:  Right 2blocks, Left 45 blocks. Modified to complete with RUE.  Pt able to grasp and move across to other side of box with barrier removed.   11/01/22: RUE 13 blocks 12/18/22: RUE 19 blocks  SENSATION: Mild decrease in R  EDEMA: mild edema in wrist hand, and fingers on RUE  OBSERVATIONS: Pt guarded with movement, hypersensitive to touch and PROM.     TODAY'S TREATMENT:                                   01/25/23 UE use: Attempted to pick up blocks with R hand, however pt with inability to complete due to decreased strength in shoulder and fingers to maintain grasp and to translate items.  After application of kinesiotape engaged in blocked practice with focus on opening hand and sustained grasp to pick up 1" blocks.  Pt able to maintain loose grasp on blocks to place on target to R to facilitate shoulder  abduction and flexion.  OT encouraged pt to resume towel slides and functional grasp and transferring of items as pt demonstrating improvements with repetition. Kinesiotape: applied kinesiotape to R forearm (anchored at lateral epicondyle and applied to forearm with 20-35% tension past DIP with fist in flexion and slightly deviated radially) and fingers to facilitate finger extension.  Reviewed typical length of wear and purpose of kinesiotape.    01/15/23 Supine PROM/AAROM: engaged in PROM for bicep flexion/extension and shoulder abduction/adduction with focus on assessing ROM.  Pt reports increased pain and inability to activate any movement in RUE.  OT providing calming voice and touch to facilitate increased ROM.  Pt tolerating PROM to full flexion/extension at biceps with pain at end ranges.  Progressing to pt being able to flex and extend 75% of ROM.  Pt with pain with abduction at ~80*, however tolerating sustained hold at end range to facilitate increased ROM.  Pt with no AROM at shoulder abduction. Massage: OT providing gentle massage and therapeutic touch at biceps with focus on initial assessment for inflammation and then tolerance to touch and movement.  Pt with no increased heat or redness, other than right at site of injection.  Pt with initial decreased tolerance to touch, however with repetition pt tolerating increased touch and mild pressure during massage.    01/08/23 Quick Dash: 61.4% with continued reports of severe difficulty or unable to perform 6 of 11 tasks.  However down from 77%. Measurements taken (see above) with pt demonstrating improvements in PROM in R ring finger, however still demonstrating decreased extension  of all digits, especially index and long finger.  Coordination: attempted 9 hole peg test, however pt with increased difficulty with picking up and manipulating small pegs, therefore transitioned to larger peg.  Pt then demonstrating improved ease with picking up  with either tip to tip or 3 jaw chuck pinch.  When pt utilizing long finger, index finger will flex out of the way requiring cues to open full hand prior to completion of task to incorporate index finger into task.  Pt initially having to pick up pegs from OT's palm then able to transition to picking up from container. Incorporated functional reach with reaching across midline and forward to facilitate increased shoulder and elbow ROM.    PATIENT EDUCATION: Education details: ongoing condition specific education and progressive exercises. Person educated: Patient and Spouse Education method: Explanation, Demonstration, Tactile cues, Verbal cues, and Handouts Education comprehension: verbalized understanding and needs further education  HOME EXERCISE PROGRAM: Access Code: [email or web address] URL: [email or web address] Date: 12/06/2022 Prepared by: [facility] - Outpatient  Rehab - [facility]  Exercises - Supine Shoulder Flexion AAROM with Dowel  - 1 x daily - 3 x weekly - 2 sets - 10 reps - Supine Shoulder Protraction with Dowel  - 1 x daily - 3 x weekly - 2 sets - 10 reps - Supine Shoulder Abduction AAROM with Dowel  - 1 x daily - 3 x weekly - 2 sets - 10 reps - Touch finger to nose  - 1 x daily - 3 x weekly - 2 sets - 10 reps - 5-10 sec hold - Touch finger to nose  - 1 x daily - 3 x weekly - 2 sets - 10 reps - Hooklying Single Arm Chest Press  - 1 x daily - 3 x weekly - 2 sets - 10 reps - Thumb AROM Opposition  - 1 x daily - 7 x weekly - 1 sets - 5 reps - Seated Finger Composite Flexion Stretch  - 1 x daily - 7 x weekly - 1 sets - 5 reps - Hand PROM Finger Extension  - 1 x daily - 7 x weekly - 1 sets - 10 reps - Seated Forearm Pronation Supination AROM  - 1 x daily - 7 x weekly - 1 sets - 10 reps - Seated Wrist Flexion Extension PROM  - 1 x daily - 7 x weekly - 1 sets - 10 reps   GOALS: Goals reviewed with patient? Yes  SHORT TERM GOALS: Target date: 02/02/23  Pt and spouse  will be independent with updated ROM and coordination HEP. Baseline: Goal status: IN Progress  2.  Pt will verbalize understanding of task modifications and/or potential AE needs to increase ease, safety, and independence w/ ADLs. Baseline: reinstated as pt to receive botox and will benefit from additional education with improved function Goal status: IN Progress    LONG TERM GOALS:  Target date: 02/23/23  1.  Pt will demonstrate ability to demonstrate improved functional grasp in RUE to open new jar, increased handwriting, and self-feeding. Baseline:  Goal status: IN Progress  2.  Pt will demonstrate improved UE functional use for ADLs as evidenced by increasing box/ blocks score by 5 blocks with RUE, to be completed in standardized manner. Baseline:  19 blocks with RUE Goal status: IN Progress  3.  Pt will demonstrate improved shoulder ROM to allow for reach to moderate height as needed for ADLs/IADLs (washing back, putting on jacket, etc).  Baseline:  Goal status: IN Progress  4. Pt will demonstrate improvement of finger extension by 15* TAM of long and ringer finger to allow for more functional hand opening.  Baseline: -36 long finger, -30 ring finger  Goal status: IN Progress  5. Pt will demonstrate improved functional grasp by 5# with RUE to allow for increased independence with cutting foods and opening food containers.  Baseline: R: 6#  Goal status: IN Progress  ASSESSMENT:  CLINICAL IMPRESSION: Pt reporting persistent pain in RUE, however down from visit last week.  Pt continues to present with decreased shoulder ROM and sustained grasp.  OT reiterated information from PM&R in regards to decreased mobility, however noting decreased spasticity.  Pt tolerated application of kinesiotape and demonstrated improved gross grasp with repetition and application of kinesiotape.  OT encouraging pt to resume gentle towel slides and gross grasp activities as tolerated.  PERFORMANCE  DEFICITS: in functional skills including ADLs, IADLs, coordination, sensation, edema, tone, ROM, strength, pain, flexibility, Fine motor control, Gross motor control, balance, body mechanics, endurance, decreased knowledge of use of DME, skin integrity, vision, and UE functional use, cognitive skills including emotional and safety awareness, and psychosocial skills including coping strategies.   IMPAIRMENTS: are limiting patient from ADLs, IADLs, and rest and sleep.   CO-MORBIDITIES: may have co-morbidities  that affects occupational performance. Patient will benefit from skilled OT to address above impairments and improve overall function.  MODIFICATION OR ASSISTANCE TO COMPLETE EVALUATION: Min-Moderate modification of tasks or assist with assess necessary to complete an evaluation.  OT OCCUPATIONAL PROFILE AND HISTORY: Detailed assessment: Review of records and additional review of physical, cognitive, psychosocial history related to current functional performance.  CLINICAL DECISION MAKING: Moderate - several treatment options, min-mod task modification necessary  REHAB POTENTIAL: Good  EVALUATION COMPLEXITY: Moderate    PLAN:  OT FREQUENCY: 2x/week  OT DURATION: 6 weeks  PLANNED INTERVENTIONS: self care/ADL training, therapeutic exercise, therapeutic activity, neuromuscular re-education, manual therapy, passive range of motion, balance training, functional mobility training, splinting, paraffin, fluidotherapy, biofeedback, compression bandaging, moist heat, cryotherapy, contrast bath, patient/family education, visual/perceptual remediation/compensation, psychosocial skills training, coping strategies training, and DME and/or AE instructions  RECOMMENDED OTHER SERVICES: NA  CONSULTED AND AGREED WITH PLAN OF CARE: Patient and family member/caregiver  PLAN FOR NEXT SESSION: AAROM or self-ROM to include shoulder flexion/extension and internal/external rotation, elbow, forearm.   Functional grasp and release.     Rosalio Loud, OTR/L 01/25/2023, 2:51 PM

## 2023-01-29 ENCOUNTER — Ambulatory Visit (HOSPITAL_BASED_OUTPATIENT_CLINIC_OR_DEPARTMENT_OTHER): Payer: Medicare PPO | Admitting: Family

## 2023-01-29 ENCOUNTER — Encounter (HOSPITAL_BASED_OUTPATIENT_CLINIC_OR_DEPARTMENT_OTHER): Payer: Self-pay | Admitting: Family

## 2023-01-29 VITALS — BP 122/66 | HR 60 | Ht 62.0 in | Wt 101.0 lb

## 2023-01-29 DIAGNOSIS — Z8673 Personal history of transient ischemic attack (TIA), and cerebral infarction without residual deficits: Secondary | ICD-10-CM

## 2023-01-29 DIAGNOSIS — I491 Atrial premature depolarization: Secondary | ICD-10-CM

## 2023-01-29 DIAGNOSIS — E785 Hyperlipidemia, unspecified: Secondary | ICD-10-CM | POA: Diagnosis not present

## 2023-01-29 DIAGNOSIS — I25118 Atherosclerotic heart disease of native coronary artery with other forms of angina pectoris: Secondary | ICD-10-CM | POA: Diagnosis not present

## 2023-01-29 DIAGNOSIS — E876 Hypokalemia: Secondary | ICD-10-CM | POA: Diagnosis not present

## 2023-01-29 DIAGNOSIS — I10 Essential (primary) hypertension: Secondary | ICD-10-CM | POA: Diagnosis not present

## 2023-01-29 LAB — CUP PACEART REMOTE DEVICE CHECK
Date Time Interrogation Session: 20240429035500
Implantable Pulse Generator Implant Date: 20230815
Pulse Gen Serial Number: 183424

## 2023-01-29 NOTE — Patient Instructions (Signed)
Medication Instructions:  Your physician recommends that you continue on your current medications as directed. Please refer to the Current Medication list given to you today.  *If you need a refill on your cardiac medications before your next appointment, please call your pharmacy*   Lab Work: Your physician recommends that you return for lab work today- BMP   Please return for Lab work in June for Fasting Lipid panel . You may come to the...   [facility] (3rd floor) [address], [place], [place] [address]  Open: 8am-Noon and 1pm-4:30pm  Please ring the doorbell on the small table when you exit the elevator and the Lab Tech will come get you  [facility] at [facility] [address], [place], [place] [address] Open: 8am-1pm, then 2pm-4:30pm   [facility]- Please see attached locations sheet stapled to your lab work with address and hours.   If you have labs (blood work) drawn today and your tests are completely normal, you will receive your results only by: MyChart Message (if you have MyChart) OR A paper copy in the mail If you have any lab test that is abnormal or we need to change your treatment, we will call you to review the results.  Follow-Up: At [facility], you and your health needs are our priority.  As part of our continuing mission to provide you with exceptional heart care, we have created designated Provider Care Teams.  These Care Teams include your primary Cardiologist (physician) and Advanced Practice Providers (APPs -  Physician Assistants and Nurse Practitioners) who all work together to provide you with the care you need, when you need it.  We recommend signing up for the patient portal called "MyChart".  Sign up information is provided on this After Visit Summary.  MyChart is used to connect with patients for Virtual Visits (Telemedicine).  Patients are able to view lab/test results, encounter notes, upcoming  appointments, etc.  Non-urgent messages can be sent to your provider as well.   To learn more about what you can do with MyChart, go to [email or web address].    Your next appointment:   4-6 month(s)  Provider:   Chilton Si, MD or Gillian Shields, NP

## 2023-01-29 NOTE — Progress Notes (Signed)
Office Visit    Patient Name: Angela Buchanan Date of Encounter: 01/29/2023  PCP:  Shon Hale, MD   [facility]  Cardiologist:  Chilton Si, MD  Advanced Practice Provider:  No care team member to display Electrophysiologist:  None   Chief Complaint    Angela Buchanan is a 72 y.o. female  presents today for hypertension follow up   Past Medical History    Past Medical History:  Diagnosis Date   Atypical chest pain 07/19/2021   Bigeminy    no current med.   Breast cancer (HCC) 06/2017   right   Bruises easily    CAD in native artery 10/31/2022   Carotid bruit 11/13/2019   Dental crowns present    History of diverticulitis    Hypertension    states under control with med., has been on med. x 5 yr.   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   Past Surgical History:  Procedure Laterality Date   ABDOMINAL HYSTERECTOMY     partial   BREAST LUMPECTOMY Right 06/22/2017   Malignant   BREAST LUMPECTOMY WITH RADIOACTIVE SEED AND SENTINEL LYMPH NODE BIOPSY Right 06/22/2017   Procedure: RIGHT BREAST LUMPECTOMY WITH RADIOACTIVE SEED AND RIGHT AXILLARY DEEP SENTINEL LYMPH NODE BIOPSY WITH BLUE DYE INJECTION;  Surgeon: Claud Kelp, MD;  Location: [facility];  Service: General;  Laterality: Right;   BREAST LUMPECTOMY WITH RADIOACTIVE SEED LOCALIZATION Left 06/22/2017   Procedure: LEFT BREAST LUMPECTOMY WITH RADIOACTIVE SEED LOCALIZATION;  Surgeon: Claud Kelp, MD;  Location: [facility];  Service: General;  Laterality: Left;   CATARACT EXTRACTION W/ INTRAOCULAR LENS  IMPLANT, BILATERAL Bilateral    EXCISION OF BREAST BIOPSY Left 06/22/2017   benign   LOOP  RECORDER INSERTION N/A 05/16/2022   Procedure: LOOP RECORDER INSERTION;  Surgeon: Lanier Prude, MD;  Location: [facility];  Service: Cardiovascular;  Laterality: N/A;   PERCUTANEOUS PINNING Right 06/22/2022   Procedure: PERCUTANEOUS PINNING OF RIGHT HIP;  Surgeon: Bjorn Pippin, MD;  Location: [facility];  Service: Orthopedics;  Laterality: Right;   TONSILLECTOMY     age 20    Allergies  Allergies  Allergen Reactions   Atenolol Other (See Comments)    CHEST PAIN   Bisoprolol Other (See Comments)    "FEELS WEIRD"   Prednisone Other (See Comments)    UNABLE TO FOCUS   Amoxicillin     unknown   Amoxicillin-Pot Clavulanate Diarrhea and Other (See Comments)   Hctz [Hydrochlorothiazide]     Hyponatremia    Rosuvastatin     myalgias   Latex Rash    History of Present Illness    Angela Buchanan is a 72 y.o. female with a hx of celiac, breast cancer s/p lumpectomy and XRT, R subclavian DVT, HTN, CVA, palpitations, PAC/PVC, HLD, CAD last seen 12/07/22 by pharmacy team.  Prior monitor 2017 for palpitations revealed frequent PVCs. Myoview 2015 with no ischemia. Seen 11/2019 for palpitations and CV risk assessment. Lisinopril increased for hypertension and Rosuvastatin initiated. She developed myalgias and reduced to every other day. Repeat 30 day monitor with some PAC and PVC which were treated by Metoprolol. Echo 12/2019 normal LVEF, gr1DD and normal carotid  dopplers. Palpitations much improved on Metoprolol. Due to need for BP control, pharmacy team increased HCTZ and reduced Metoprolol. HCTZ then transitioned to Amlodipine due to hyponatremia. Myoview 10/2020 with LVEF 70%, fixed perfusion defect at apex, apical anterior, lateral and inferior walls to felt to be artifact with no ischemia. She had been transitioned from Rosuvastatin to Pravasatatin due to myalgias. Cardiac CTA11/2022 due to reports of chest pain which showed coronary calcium score of 14.4 with minimal nonobstructive coronary  disease.     Admitted 05/2022 with cryptogenic stroke.  Echo LVEF 60 to 65%, normal diastolic function.  ILR implanted and no arrhythmias have been detected.  Saw Dr. Duke Salvia 10/31/22 doing overall well aside from residual right-sided hemiparesis and tremors.  Lipid panel updated.  BP elevated and amlodipine increased to 10 mg.  Lisinopril and metoprolol were continued at present dose.  She saw pharmacy team 12/07/2022.  LDL not at goal of less than 70.  Pravastatin continued and Zetia 10 mg daily added with plan for FLP in 3 months.  Presents today for follow up with her husband. 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 Continues to work with physical therapy and occupational therapy after stroke.Understandably frustrated as had botox recently without much improvement in mobility. Notes when she takes amlodipine she will occasionally feel pressure in her chest. Also notes it can feel like the pill is getting stuck. Encouraged to trial with food instead.   Reports no shortness of breath nor dyspnea on exertion. Reports no chest pain, pressure, or tightness. No edema, orthopnea, PND. Reports no palpitations.    EKGs/Labs/Other Studies Reviewed:   The following studies were reviewed today:  Cardiac Studies & Procedures     STRESS TESTS  MYOCARDIAL PERFUSION IMAGING 10/05/2020  Narrative  There was no significant ST segment deviation noted during stress. <46mm ST depression in V3/4  The left ventricular ejection fraction is hyperdynamic (>65%).  Nuclear stress EF: 70%.  Defect 1: There is a small defect of moderate severity present in the apical anterior, apical inferior, apical lateral and apex location.  The study is normal.  This is a low risk study.  1. Moderate fixed perfusion defect at apex and apical anterior/lateral/inferior walls, with normal wall motion in this area, suggestive of artifact 2. Low risk study   ECHOCARDIOGRAM  ECHOCARDIOGRAM COMPLETE  05/13/2022  Narrative ECHOCARDIOGRAM REPORT    Patient Name:   Angela Buchanan Date of Exam: 05/13/2022 Medical Rec #:  [id number]     Height:       62.0 in Accession #:    [id number]    Weight:       120.8 lb Date of Birth:  09/04/1951     BSA:          1.543 m Patient Age:    70 years      BP:           133/75 mmHg Patient Gender: F             HR:           69 bpm. Exam Location:  Inpatient  Procedure: 2D Echo, Cardiac Doppler and Color Doppler  Indications:    Stroke  History:        Patient has no prior history of Echocardiogram examinations.  Sonographer:    Roosvelt Maser RDCS Referring Phys: [id number] ERIC LINDZEN  IMPRESSIONS   1. Left ventricular ejection fraction, by estimation, is 60 to 65%. The left ventricle has normal function.  The left ventricle has no regional wall motion abnormalities. Left ventricular diastolic parameters were normal. 2. Right ventricular systolic function is normal. The right ventricular size is normal. Tricuspid regurgitation signal is inadequate for assessing PA pressure. 3. The mitral valve is grossly normal. Trivial mitral valve regurgitation. No evidence of mitral stenosis. 4. The aortic valve is tricuspid. Aortic valve regurgitation is not visualized. No aortic stenosis is present. 5. The inferior vena cava is normal in size with greater than 50% respiratory variability, suggesting right atrial pressure of 3 mmHg.  Conclusion(s)/Recommendation(s): No intracardiac source of embolism detected on this transthoracic study. Consider a transesophageal echocardiogram to exclude cardiac source of embolism if clinically indicated.  FINDINGS Left Ventricle: Left ventricular ejection fraction, by estimation, is 60 to 65%. The left ventricle has normal function. The left ventricle has no regional wall motion abnormalities. The left ventricular internal cavity size was normal in size. There is no left ventricular hypertrophy. Left ventricular diastolic parameters  were normal.  Right Ventricle: The right ventricular size is normal. No increase in right ventricular wall thickness. Right ventricular systolic function is normal. Tricuspid regurgitation signal is inadequate for assessing PA pressure.  Left Atrium: Left atrial size was normal in size.  Right Atrium: Right atrial size was normal in size.  Pericardium: There is no evidence of pericardial effusion.  Mitral Valve: The mitral valve is grossly normal. Trivial mitral valve regurgitation. No evidence of mitral valve stenosis.  Tricuspid Valve: The tricuspid valve is grossly normal. Tricuspid valve regurgitation is not demonstrated. No evidence of tricuspid stenosis.  Aortic Valve: The aortic valve is tricuspid. Aortic valve regurgitation is not visualized. No aortic stenosis is present.  Pulmonic Valve: The pulmonic valve was grossly normal. Pulmonic valve regurgitation is not visualized. No evidence of pulmonic stenosis.  Aorta: The aortic root and ascending aorta are structurally normal, with no evidence of dilitation.  Venous: The inferior vena cava is normal in size with greater than 50% respiratory variability, suggesting right atrial pressure of 3 mmHg.  IAS/Shunts: The atrial septum is grossly normal.   LEFT VENTRICLE PLAX 2D LVIDd:         3.60 cm   Diastology LVIDs:         2.60 cm   LV e' medial:    10.30 cm/s LV PW:         0.80 cm   LV E/e' medial:  6.4 LV IVS:        0.60 cm   LV e' lateral:   8.05 cm/s LVOT diam:     1.80 cm   LV E/e' lateral: 8.2 LV SV:         56 LV SV Index:   36 LVOT Area:     2.54 cm   RIGHT VENTRICLE RV Basal diam:  2.60 cm  LEFT ATRIUM             Index        RIGHT ATRIUM           Index LA diam:        2.60 cm 1.69 cm/m   RA Area:     12.60 cm LA Vol (A2C):   50.2 ml 32.53 ml/m  RA Volume:   27.80 ml  18.02 ml/m LA Vol (A4C):   33.5 ml 21.71 ml/m LA Biplane Vol: 40.9 ml 26.51 ml/m AORTIC VALVE LVOT Vmax:   108.00 cm/s LVOT Vmean:   67.200 cm/s LVOT VTI:    0.221 m  AORTA  Ao Root diam: 2.90 cm Ao Asc diam:  2.80 cm  MITRAL VALVE MV Area (PHT): 3.91 cm    SHUNTS MV Decel Time: 194 msec    Systemic VTI:  0.22 m MV E velocity: 65.70 cm/s  Systemic Diam: 1.80 cm MV A velocity: 98.50 cm/s MV E/A ratio:  0.67  Lennie Odor MD Electronically signed by Lennie Odor MD Signature Date/Time: 05/13/2022/6:21:45 PM    Final    MONITORS  CARDIAC EVENT MONITOR 01/11/2020  Narrative 30 Day Event Monitor  Quality: Fair.  Baseline artifact. Predominant rhythm: sinus rhythm Average heart rate: 69 bpm Max heart rate: 131 bpm Min heart rate: 47 bpm Pauses >2.5 seconds: none  Patient did submit a symptom diary.  She reported multiple episodes of chest pain and fluttering.  At which time most of the time sinus rhythm with noted.  She did have occasional PACs or PVCs. 4 beats of atrial tachycardia. Occasional PACs and PVCs noted.  Tiffany C. Duke Salvia, MD, [facility] 01/11/2020 4:24 PM   CT SCANS  CT CORONARY MORPH W/CTA COR W/SCORE 08/04/2021  Addendum 08/04/2021  8:31 AM ADDENDUM REPORT: 08/04/2021 08:28  EXAM: OVER-READ INTERPRETATION  CT CHEST  The following report is an over-read performed by radiologist Dr. Deberah Pelton [facility] on 08/03/2021. This over-read does not include interpretation of cardiac or coronary anatomy or pathology. The coronary calcium score/coronary CTA interpretation by the cardiologist is attached.  COMPARISON:  None.  FINDINGS: No suspicious nodules, masses, or infiltrates are identified in the visualized portion of the lungs. No pleural fluid seen.  The visualized portions of the mediastinum and chest wall are unremarkable.  IMPRESSION: No significant non-cardiac abnormality in visualized portion of the thorax.   Electronically Signed By: Danae Orleans M.D. On: 08/04/2021 08:28  Narrative CLINICAL DATA:  72 yo female with chest  pain  EXAM: Cardiac/Coronary CTA  TECHNIQUE: A non-contrast, gated CT scan was obtained with axial slices of 3 mm through the heart for calcium scoring. Calcium scoring was performed using the Agatston method. A 120 kV prospective, gated, contrast cardiac scan was obtained. Gantry rotation speed was 250 msecs and collimation was 0.6 mm. Two sublingual nitroglycerin tablets (0.8 mg) were given. The 3D data set was reconstructed in 5% intervals of the 35-75% of the R-R cycle. Diastolic phases were analyzed on a dedicated workstation using MPR, MIP, and VRT modes. The patient received 95 cc of contrast.  FINDINGS: Image quality: Average.  Noise artifact is: Limited.  Coronary Arteries:  Normal coronary origin.  Right dominance.  Left main: The left main is a large caliber vessel with a normal take off from the left coronary cusp that bifurcates to form a left anterior descending artery. There is no plaque or stenosis.  Left anterior descending artery: The LAD is patent without evidence of plaque or stenosis. The LAD gives off branching D1, smaller D2 and very smalll D3.  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  Right coronary artery: The RCA is dominant with normal take off from the right coronary cusp. There is no evidence of plaque or stenosis. The RCA gives off PDA, 2 posterolateral branches and then terminates in the left circumflex territory. There is minimal (0-24) calcified plaque in the PDA.  Right Atrium: Right atrial size is within normal limits.  Right Ventricle: The right ventricular cavity is within normal limits.  Left Atrium: Left atrial size is normal in size with no left atrial appendage filling defect.  Left Ventricle: The ventricular cavity size is within normal limits. There are no stigmata of prior infarction. There is no abnormal filling defect.  Pulmonary arteries: Normal in size.  Pulmonary veins: Normal pulmonary  venous drainage.  Pericardium: Normal thickness with no significant effusion or calcium present.  Cardiac valves: The aortic valve is trileaflet without significant calcification. The mitral valve is normal structure without significant calcification.  Aorta: Normal caliber with mild atherosclerosis.  Extra-cardiac findings: See attached radiology report for non-cardiac structures.  IMPRESSION: 1. Coronary calcium score of 14.4. This was 86 percentile for age-, sex, and race-matched controls.  2. Normal coronary origin with right dominance; congenitally absent left circumflex; RCA gives off PDA and 2 posterolateral branches and terminates in the left circumflex distribution.  3. Minimal plaque in the PDA.  4. Mild aortic atherosclerosis.  RECOMMENDATIONS: CAD-RADS 1: Minimal non-obstructive CAD (0-24%). Consider non-atherosclerotic causes of chest pain. Consider preventive therapy and risk factor modification.  Olga Millers, MD  interpretation by the cardiologist is attached.  Electronically Signed: By: Olga Millers M.D. On: 08/02/2021 15:54             EKG:  EKG is not ordered today.   Recent Labs: 06/23/2022: Magnesium 1.9 06/24/2022: Hemoglobin 10.1; Platelets 395 10/31/2022: ALT 11 12/07/2022: BUN 10; Creatinine, Ser 0.77; Potassium 4.8; Sodium 137  Recent Lipid Panel    Component Value Date/Time   CHOL 184 10/31/2022 1523   TRIG 124 10/31/2022 1523   HDL 82 10/31/2022 1523   CHOLHDL 2.2 10/31/2022 1523   CHOLHDL 3.1 05/13/2022 0137   VLDL 11 05/13/2022 0137   LDLCALC 81 10/31/2022 1523    Home Medications   Current Meds  Medication Sig   amLODipine (NORVASC) 10 MG tablet Take 1 tablet (10 mg total) by mouth daily.   aspirin EC 81 MG tablet Take 1 tablet (81 mg total) by mouth daily. Swallow whole.   calcium-vitamin D (OSCAL WITH D) 500-5 MG-MCG tablet Take 1 tablet by mouth daily with breakfast.   cetirizine (ZYRTEC) 10 MG tablet Take 10 mg by  mouth daily.   Cholecalciferol (VITAMIN D) 50 MCG (2000 UT) tablet Take 2,000 Units by mouth daily.   clobetasol ointment (TEMOVATE) 0.05 % Apply topically 2 (two) times daily as needed (itching). (Patient taking differently: Apply 1 Application topically 2 (two) times daily as needed (itching).)   clopidogrel (PLAVIX) 75 MG tablet Take 1 tablet (75 mg total) by mouth daily.   docusate sodium (COLACE) 100 MG capsule Take 1 capsule (100 mg total) by mouth 2 (two) times daily.   ezetimibe (ZETIA) 10 MG tablet Take 1 tablet (10 mg total) by mouth daily.   feeding supplement (ENSURE ENLIVE / ENSURE PLUS) LIQD Take 237 mLs by mouth 2 (two) times daily between meals.   ferrous sulfate 325 (65 FE) MG tablet Take 1 tablet (325 mg total) by mouth 2 (two) times daily with a meal.   fluticasone (FLONASE) 50 MCG/ACT nasal spray Place 1 spray into both nostrils daily as needed for allergies or rhinitis.   HYDROcodone-acetaminophen (NORCO) 5-325 MG tablet Take 1 tablet by mouth every 6 (six) hours as needed for severe pain.   hydrOXYzine (ATARAX) 25 MG tablet Take 25 mg by mouth 3 (three) times daily as needed for nausea or vomiting.   levothyroxine (SYNTHROID) 25 MCG tablet Take 1 tablet (25 mcg total) by mouth daily at 6 (six) AM.   lisinopril (ZESTRIL) 40 MG tablet Take 40 mg by mouth daily.  meclizine (ANTIVERT) 25 MG tablet Take 25 mg by mouth daily as needed for dizziness.   methocarbamol (ROBAXIN) 500 MG tablet Take 1 tablet (500 mg total) by mouth every 6 (six) hours as needed for muscle spasms.   metoprolol tartrate (LOPRESSOR) 25 MG tablet Take 25 mg by mouth every evening.   Multiple Vitamins-Minerals (CENTRUM SILVER 50+WOMEN PO) Take 1 tablet by mouth daily.   nystatin (MYCOSTATIN) 100000 UNIT/ML suspension Take 5 mLs (500,000 Units total) by mouth 4 (four) times daily.   oxyCODONE-acetaminophen (PERCOCET) 5-325 MG tablet Take 1-2 tablets by mouth every 6 (six) hours as needed.   pantoprazole  (PROTONIX) 40 MG tablet Take 1 tablet (40 mg total) by mouth daily at 12 noon.   polyethylene glycol (MIRALAX / GLYCOLAX) 17 g packet Take 17 g by mouth daily as needed for mild constipation.   potassium chloride SA (KLOR-CON M20) 20 MEQ tablet Alternate 20 and tablets every other day   pravastatin (PRAVACHOL) 40 MG tablet Take 1 tablet (40 mg total) by mouth daily.   senna (SENOKOT) 8.6 MG TABS tablet Take 2 tablets (17.2 mg total) by mouth daily as needed for mild constipation.     Review of Systems      All other systems reviewed and are otherwise negative except as noted above.  Physical Exam    VS:  BP 122/66   Pulse 60   Ht 5\' 2"  (1.575 m)   Wt 101 lb (45.8 kg)   LMP  (LMP Unknown)   BMI 18.47 kg/m  , BMI Body mass index is 18.47 kg/m.  Wt Readings from Last 3 Encounters:  01/29/23 101 lb (45.8 kg)  01/11/23 100 lb (45.4 kg)  12/14/22 101 lb (45.8 kg)     GEN: Well nourished, well developed, in no acute distress. HEENT: normal. Neck: Supple, no JVD, carotid bruits, or masses. Cardiac: RRR, no murmurs, rubs, or gallops. No clubbing, cyanosis, edema.  Radials/PT 2+ and equal bilaterally.  Respiratory:  Respirations regular and unlabored, clear to auscultation bilaterally. GI: Soft, nontender, nondistended. MS: No deformity or atrophy. Skin: Warm and dry, no rash. Neuro:  Strength and sensation are intact. Psych: Normal affect.  Assessment & Plan    HLD - Continue Pravastatin, Zetia. FLP in June to reassess LDL after addition of Zetia.   HTN - BP well controlled. Continue current antihypertensive regimen.  Heart healthy diet and regular cardiovascular exercise encouraged.    History of CVA- ILR with no arrhythmias. Continue lipid control with Pravastatin, Zetia. Continue optimal BP control with goal <130/80.   PAC - Well controlled on Metoprolol.   Hypokalemia - Due for BMP, collect today.   CAD- cardiac CTA 08/2021 with coronary calcium score 14.4 which  was 47th percentile with minimal calcified plaque in the PDA. 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 Stable with no anginal symptoms. No indication for ischemic evaluation.  GDMT includes Aspirin, Metoprolol, Pravastatin.   Disposition: Follow up  in 4-6 months  with Chilton Si, MD or APP.  Signed, Alver Sorrow, NP 01/29/2023, 2:43 PM [facility]

## 2023-01-30 ENCOUNTER — Ambulatory Visit: Payer: Medicare PPO | Admitting: Occupational Therapy

## 2023-01-30 ENCOUNTER — Telehealth (HOSPITAL_BASED_OUTPATIENT_CLINIC_OR_DEPARTMENT_OTHER): Payer: Self-pay

## 2023-01-30 DIAGNOSIS — M25611 Stiffness of right shoulder, not elsewhere classified: Secondary | ICD-10-CM

## 2023-01-30 DIAGNOSIS — I69354 Hemiplegia and hemiparesis following cerebral infarction affecting left non-dominant side: Secondary | ICD-10-CM | POA: Diagnosis not present

## 2023-01-30 DIAGNOSIS — M6281 Muscle weakness (generalized): Secondary | ICD-10-CM

## 2023-01-30 DIAGNOSIS — R278 Other lack of coordination: Secondary | ICD-10-CM | POA: Diagnosis not present

## 2023-01-30 DIAGNOSIS — M79601 Pain in right arm: Secondary | ICD-10-CM

## 2023-01-30 LAB — BASIC METABOLIC PANEL
BUN/Creatinine Ratio: 11 — ABNORMAL LOW (ref 12–28)
BUN: 8 mg/dL (ref 8–27)
CO2: 19 mmol/L — ABNORMAL LOW (ref 20–29)
Calcium: 10.4 mg/dL — ABNORMAL HIGH (ref 8.7–10.3)
Chloride: 101 mmol/L (ref 96–106)
Creatinine, Ser: 0.74 mg/dL (ref 0.57–1.00)
Glucose: 102 mg/dL — ABNORMAL HIGH (ref 70–99)
Potassium: 5.2 mmol/L (ref 3.5–5.2)
Sodium: 135 mmol/L (ref 134–144)
eGFR: 86 mL/min/{1.73_m2} (ref >59)

## 2023-01-30 NOTE — Telephone Encounter (Addendum)
Left message for patient to call back     ----- Message from Alver Sorrow, NP sent at 01/30/2023  5:00 PM EDT ----- Normal kidney function.  Calcium mildly elevated, not of concern and as it is overall stable.  Potassium remains normal.  If she wishes to simplify her medication regimen we could reduce her potassium to 20 mill equivalent every day.  If so would recommend updating BMP with her lipid panel in June.  If she wishes to continue potassium 20 mill equivalent and 40 mill equivalent every other day that is also fine.

## 2023-01-30 NOTE — Therapy (Signed)
OUTPATIENT OCCUPATIONAL THERAPY  Treatment Session   Patient Name: Angela Buchanan MRN: [id number] DOB:01/26/1951, 72 y.o., female Today's Date: 01/30/2023  PCP: Shon Hale, MD REFERRING PROVIDER: Shon Hale, MD       END OF SESSION:  OT End of Session - 01/30/23 1450     Visit Number 26    Number of Visits 41    Date for OT Re-Evaluation 02/23/23    Authorization Type [employer]    OT Start Time 1448    OT Stop Time 1530    OT Time Calculation (min) 42 min                                  Past Medical History:  Diagnosis Date   Atypical chest pain 07/19/2021   Bigeminy    no current med.   Breast cancer (HCC) 06/2017   right   Bruises easily    CAD in native artery 10/31/2022   Carotid bruit 11/13/2019   Dental crowns present    History of diverticulitis    Hypertension    states under control with med., has been on med. x 5 yr.   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   Past Surgical History:  Procedure Laterality Date   ABDOMINAL HYSTERECTOMY     partial   BREAST LUMPECTOMY Right 06/22/2017   Malignant   BREAST LUMPECTOMY WITH RADIOACTIVE SEED AND SENTINEL LYMPH NODE BIOPSY Right 06/22/2017   Procedure: RIGHT BREAST LUMPECTOMY WITH RADIOACTIVE SEED AND RIGHT AXILLARY DEEP SENTINEL LYMPH NODE BIOPSY WITH BLUE DYE INJECTION;  Surgeon: Claud Kelp, MD;  Location: [facility];  Service: General;  Laterality: Right;   BREAST LUMPECTOMY WITH RADIOACTIVE SEED LOCALIZATION Left 06/22/2017   Procedure: LEFT BREAST LUMPECTOMY WITH RADIOACTIVE SEED LOCALIZATION;  Surgeon: Claud Kelp, MD;  Location: [facility];  Service: General;   Laterality: Left;   CATARACT EXTRACTION W/ INTRAOCULAR LENS  IMPLANT, BILATERAL Bilateral    EXCISION OF BREAST BIOPSY Left 06/22/2017   benign   LOOP RECORDER INSERTION N/A 05/16/2022   Procedure: LOOP RECORDER INSERTION;  Surgeon: Lanier Prude, MD;  Location: [facility];  Service: Cardiovascular;  Laterality: N/A;   PERCUTANEOUS PINNING Right 06/22/2022   Procedure: PERCUTANEOUS PINNING OF RIGHT HIP;  Surgeon: Bjorn Pippin, MD;  Location: [facility];  Service: Orthopedics;  Laterality: Right;   TONSILLECTOMY     age 3   Patient Active Problem List   Diagnosis Date Noted   Right spastic hemiplegia (HCC) 12/14/2022   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 05/11/2020   PVC (premature ventricular contraction) 05/11/2020   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  Carotid bruit 11/13/2019   Pure hypercholesterolemia 11/13/2019   Malignant neoplasm of upper-outer quadrant of right breast in female, estrogen receptor positive (HCC) 05/22/2017   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   Diplopia 09/17/2014   Dizziness and giddiness 09/17/2014   Essential hypertension 07/22/2014   Chest pain 07/22/2014   Family history of heart disease 07/22/2014    ONSET DATE: CVA 05/12/22  REFERRING DIAG: M79.641 (ICD-10-CM) - Pain in right hand  THERAPY DIAG:  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 Pt accompanied by: self and significant other (Husband, [name])  PERTINENT HISTORY: hypertension, breast cancer, hyperlipidemia, Lt hip fracture secondary to fall 06/2022, CVA 06/2022   PRECAUTIONS: Fall, Rt breast cancer with node removal, osteoporosis  WEIGHT BEARING RESTRICTIONS: No  PAIN:  Are you having pain? Yes: NPRS scale: 5/10 Pain location: upper arm Pain description: constant Aggravating factors: certain movements Relieving factors: advil  FALLS: Has patient fallen in last 6 months? Yes. Number of falls 1 fall, one week after d/c home from SNF  LIVING ENVIRONMENT: Lives with: lives with their spouse Lives in: House/apartment Stairs: Yes: External: 2 steps; on left going up Has following equipment at home: Dan Humphreys - 2 wheeled, Hemi walker, Wheelchair (manual), shower chair, and Grab bars  PLOF: Independent and Independent with basic ADLs  PATIENT GOALS: to get use of my R arm again  OBJECTIVE:   HAND DOMINANCE: Right  ADLs: Transfers/ambulation related to ADLs: Mod I with hemi-walker Eating: using L hand for eating, husband is assisting with cutting up food Grooming: Mod I, using L hand UB Dressing: Mod assist LB Dressing: Max assist Toileting: Mod I Bathing: Supervision - Mod I Tub Shower transfers: Tourist information centre manager: Shower seat with back and Grab bars  UPPER EXTREMITY ROM:    Active ROM Right eval Left eval Right 10/18/22  Right 11/15/22 Right 12/18/22  Shoulder flexion 54   82 86  Shoulder abduction       Shoulder adduction       Shoulder extension       Shoulder internal rotation       Shoulder external  rotation       Elbow flexion 108   132 136  Elbow extension -24   -12 -12  Wrist flexion    38 36  Wrist extension    18 24  Wrist ulnar deviation       Wrist radial deviation       Wrist pronation       Wrist supination       (Blank rows = not tested)  Passive ROM Right eval Left eval Right 10/18/22 Right 11/15/22 Right 12/18/22  Shoulder flexion 65  76 86 94  Shoulder abduction       Shoulder adduction       Shoulder extension       Shoulder internal rotation       Shoulder external rotation       Elbow flexion       Elbow extension -17  -15 -10 0  Wrist flexion 46  56 60 60  Wrist extension 27  50 40 56  Wrist ulnar deviation  Wrist radial deviation       Wrist pronation       Wrist supination       (Blank rows = not tested)  Passive ROM Right 10/18/22 Right 11/15/22 Right 12/18/22 Right 01/08/23  Thumb MCP (0-60)      Thumb IP (0-80)      Thumb Radial abd/add (0-55)      Thumb Palmar abd/add (0-45)      Thumb Opposition to Small Finger      Index MCP (0-90)  0    Index PIP (0-100)  -5    Index DIP (0-70)   -3    Long MCP (0-90)       Long PIP (0-100) -30  -30 -30 -30  Long DIP (0-70) -20 -20 -5 -6  Ring MCP (0-90)       Ring PIP (0-100) -25  -21 -30 -24  Ring DIP (0-70) -15  -8 -7 -6  Little MCP (0-90)       Little PIP (0-100)   -18    Little DIP (0-70)   -4    (Blank rows = not tested)   UPPER EXTREMITY MMT:   not assessed due to pain with any PROM, AROM    HAND FUNCTION: Loose gross grasp  R gross grasp: 5#; L gross grasp: 34# 01/08/23: R: 6# gross grasp, lateral pinch 6# (L: 11#), tip to tip R: 2# (L: 8#) 01/30/23: R: 2# gross grasp  COORDINATION: Box and Blocks:  Right 2blocks, Left 45 blocks. Modified to complete with RUE.  Pt able to grasp and move across to other side of box with barrier removed.   11/01/22: RUE 13 blocks 12/18/22: RUE 19 blocks  SENSATION: Mild decrease in R  EDEMA: mild edema in wrist hand, and fingers on  RUE  OBSERVATIONS: Pt guarded with movement, hypersensitive to touch and PROM.     TODAY'S TREATMENT:                                   01/30/23 Kinesiotape: removed previous kinesiotape and applied kinesiotape to R forearm (anchored at lateral epicondyle and applied to forearm with 20-35% tension past DIP with fist in flexion and slightly deviated radially) and fingers to facilitate finger extension.  Reviewed typical length of wear and purpose of kinesiotape. UE ROM: Completed chest press, shoulder flexion ~80*, horizontal abduction/adduction with dowel x8 with focus on quality of movement.  Pt continues to demonstrate decreased ROM and strength, however able to complete exercises within 75% of how she was completing them prior to botox.  Reiterated importance of resumption of exercises as pain as decreased to manageable. Hand exercise: hand opening/closing with focus on AROM and then PROM to further stretch fingers into extension.  Encouraged pt to complete on table top to allow for increased focus and isolation on hand movements.    01/25/23 UE use: Attempted to pick up blocks with R hand, however pt with inability to complete due to decreased strength in shoulder and fingers to maintain grasp and to translate items.  After application of kinesiotape engaged in blocked practice with focus on opening hand and sustained grasp to pick up 1" blocks.  Pt able to maintain loose grasp on blocks to place on target to R to facilitate shoulder abduction and flexion.  OT encouraged pt to resume towel slides and functional grasp and transferring of items as pt demonstrating improvements with  repetition. Kinesiotape: applied kinesiotape to R forearm (anchored at lateral epicondyle and applied to forearm with 20-35% tension past DIP with fist in flexion and slightly deviated radially) and fingers to facilitate finger extension.  Reviewed typical length of wear and purpose of kinesiotape.    01/15/23 Supine  PROM/AAROM: engaged in PROM for bicep flexion/extension and shoulder abduction/adduction with focus on assessing ROM.  Pt reports increased pain and inability to activate any movement in RUE.  OT providing calming voice and touch to facilitate increased ROM.  Pt tolerating PROM to full flexion/extension at biceps with pain at end ranges.  Progressing to pt being able to flex and extend 75% of ROM.  Pt with pain with abduction at ~80*, however tolerating sustained hold at end range to facilitate increased ROM.  Pt with no AROM at shoulder abduction. Massage: OT providing gentle massage and therapeutic touch at biceps with focus on initial assessment for inflammation and then tolerance to touch and movement.  Pt with no increased heat or redness, other than right at site of injection.  Pt with initial decreased tolerance to touch, however with repetition pt tolerating increased touch and mild pressure during massage.    PATIENT EDUCATION: Education details: ongoing condition specific education and progressive exercises. Person educated: Patient and Spouse Education method: Explanation, Demonstration, Tactile cues, Verbal cues, and Handouts Education comprehension: verbalized understanding and needs further education  HOME EXERCISE PROGRAM: Access Code: [email or web address] URL: [email or web address] Date: 01/30/2023 Prepared by: [facility] - [facility]  Exercises - Supine Shoulder Flexion AAROM with Dowel  - 1 x daily - 3 x weekly - 2 sets - 10 reps - Supine Shoulder Protraction with Dowel  - 1 x daily - 3 x weekly - 2 sets - 10 reps - Supine Shoulder Abduction AAROM with Dowel  - 1 x daily - 3 x weekly - 2 sets - 10 reps - Touch finger to nose  - 1 x daily - 3 x weekly - 2 sets - 10 reps - 5-10 sec hold - Touch finger to nose  - 1 x daily - 3 x weekly - 2 sets - 10 reps - Closing and Opening Hand With Arm Supported  - 2 x daily - 7 x weekly - 3 sets - 10 reps -  Hooklying Single Arm Chest Press  - 1 x daily - 3 x weekly - 2 sets - 10 reps - Thumb AROM Opposition  - 1 x daily - 7 x weekly - 1 sets - 5 reps - Seated Finger Composite Flexion Stretch  - 1 x daily - 7 x weekly - 1 sets - 5 reps - Hand PROM Finger Extension  - 1 x daily - 7 x weekly - 1 sets - 10 reps - Seated Forearm Pronation Supination AROM  - 1 x daily - 7 x weekly - 1 sets - 10 reps - Seated Wrist Flexion Extension PROM  - 1 x daily - 7 x weekly - 1 sets - 10 reps   GOALS: Goals reviewed with patient? Yes  SHORT TERM GOALS: Target date: 02/02/23  Pt and spouse will be independent with updated ROM and coordination HEP. Baseline: Goal status: IN Progress  2.  Pt will verbalize understanding of task modifications and/or potential AE needs to increase ease, safety, and independence w/ ADLs. Baseline: reinstated as pt to receive botox and will benefit from additional education with improved function Goal status: IN Progress    LONG TERM GOALS:  Target date: 02/23/23  1.  Pt will demonstrate ability to demonstrate improved functional grasp in RUE to open new jar, increased handwriting, and self-feeding. Baseline:  Goal status: IN Progress  2.  Pt will demonstrate improved UE functional use for ADLs as evidenced by increasing box/ blocks score by 5 blocks with RUE, to be completed in standardized manner. Baseline:  19 blocks with RUE Goal status: IN Progress  3.  Pt will demonstrate improved shoulder ROM to allow for reach to moderate height as needed for ADLs/IADLs (washing back, putting on jacket, etc).  Baseline:  Goal status: IN Progress  4. Pt will demonstrate improvement of finger extension by 15* TAM of long and ringer finger to allow for more functional hand opening.  Baseline: -36 long finger, -30 ring finger  Goal status: IN Progress  5. Pt will demonstrate improved functional grasp by 5# with RUE to allow for increased independence with cutting foods and opening food  containers.  Baseline: R: 6#  Goal status: IN Progress  ASSESSMENT:  CLINICAL IMPRESSION: Pt continues to present with decreased shoulder ROM and grasp strength.  OT reiterated decreased spasticity in hand and forearm, utilizing therapeutic listening for pt concerns in regards to decreased strength and ROM.  Pt appreciated kinesiotape from previous session and tolerated re-application of kinesiotape.  Pt demonstrating ability to engage in shoulder HEP from prior to botox, demonstrating shoulder flexion and abduction to ~80* and requiring increased effort when attempting to reach towards chin.  PERFORMANCE DEFICITS: in functional skills including ADLs, IADLs, coordination, sensation, edema, tone, ROM, strength, pain, flexibility, Fine motor control, Gross motor control, balance, body mechanics, endurance, decreased knowledge of use of DME, skin integrity, vision, and UE functional use, cognitive skills including emotional and safety awareness, and psychosocial skills including coping strategies.   IMPAIRMENTS: are limiting patient from ADLs, IADLs, and rest and sleep.   CO-MORBIDITIES: may have co-morbidities  that affects occupational performance. Patient will benefit from skilled OT to address above impairments and improve overall function.  MODIFICATION OR ASSISTANCE TO COMPLETE EVALUATION: Min-Moderate modification of tasks or assist with assess necessary to complete an evaluation.  OT OCCUPATIONAL PROFILE AND HISTORY: Detailed assessment: Review of records and additional review of physical, cognitive, psychosocial history related to current functional performance.  CLINICAL DECISION MAKING: Moderate - several treatment options, min-mod task modification necessary  REHAB POTENTIAL: Good  EVALUATION COMPLEXITY: Moderate    PLAN:  OT FREQUENCY: 2x/week  OT DURATION: 6 weeks  PLANNED INTERVENTIONS: self care/ADL training, therapeutic exercise, therapeutic activity, neuromuscular  re-education, manual therapy, passive range of motion, balance training, functional mobility training, splinting, paraffin, fluidotherapy, biofeedback, compression bandaging, moist heat, cryotherapy, contrast bath, patient/family education, visual/perceptual remediation/compensation, psychosocial skills training, coping strategies training, and DME and/or AE instructions  RECOMMENDED OTHER SERVICES: NA  CONSULTED AND AGREED WITH PLAN OF CARE: Patient and family member/caregiver  PLAN FOR NEXT SESSION: AAROM or self-ROM to include shoulder flexion/extension and internal/external rotation, elbow, forearm.  Functional grasp and release.     Rosalio Loud, OTR/L 01/30/2023, 2:50 PM

## 2023-01-31 ENCOUNTER — Telehealth (HOSPITAL_BASED_OUTPATIENT_CLINIC_OR_DEPARTMENT_OTHER): Payer: Self-pay | Admitting: Cardiovascular Disease

## 2023-01-31 ENCOUNTER — Ambulatory Visit: Payer: Medicare PPO | Attending: Family Medicine

## 2023-01-31 DIAGNOSIS — M79601 Pain in right arm: Secondary | ICD-10-CM | POA: Diagnosis not present

## 2023-01-31 DIAGNOSIS — R2681 Unsteadiness on feet: Secondary | ICD-10-CM | POA: Insufficient documentation

## 2023-01-31 DIAGNOSIS — R2689 Other abnormalities of gait and mobility: Secondary | ICD-10-CM | POA: Diagnosis not present

## 2023-01-31 DIAGNOSIS — M6281 Muscle weakness (generalized): Secondary | ICD-10-CM | POA: Insufficient documentation

## 2023-01-31 DIAGNOSIS — R293 Abnormal posture: Secondary | ICD-10-CM | POA: Insufficient documentation

## 2023-01-31 DIAGNOSIS — E876 Hypokalemia: Secondary | ICD-10-CM

## 2023-01-31 DIAGNOSIS — I10 Essential (primary) hypertension: Secondary | ICD-10-CM

## 2023-01-31 DIAGNOSIS — M25611 Stiffness of right shoulder, not elsewhere classified: Secondary | ICD-10-CM | POA: Insufficient documentation

## 2023-01-31 DIAGNOSIS — E785 Hyperlipidemia, unspecified: Secondary | ICD-10-CM

## 2023-01-31 MED ORDER — POTASSIUM CHLORIDE CRYS ER 20 MEQ PO TBCR: 20.0000 meq | EXTENDED_RELEASE_TABLET | Freq: Every day | ORAL | 3 refills | Status: DC

## 2023-01-31 NOTE — Telephone Encounter (Signed)
-----   Message from Alver Sorrow, NP sent at 01/30/2023  5:00 PM EDT ----- Normal kidney function.  Calcium mildly elevated, not of concern and as it is overall stable.  Potassium remains normal.  If she wishes to simplify her medication regimen we could reduce her potassium to 20 mill equivalent every day.  If so would recommend updating BMP with her lipid panel in June.  If she wishes to continue potassium 20 mill equivalent and 40 mill equivalent every other day that is also fine.

## 2023-01-31 NOTE — Telephone Encounter (Signed)
Patient is returning RN's call from yesterday regarding her results. Please advise.

## 2023-01-31 NOTE — Telephone Encounter (Signed)
Discussed with patient and she will take K+ 20 meq daily and recheck labs in June  Mailed lab orders and updated prescription

## 2023-01-31 NOTE — Therapy (Signed)
OUTPATIENT PHYSICAL THERAPY TREATMENT NOTE   Patient Name: Angela Buchanan MRN: [id number] DOB:19-Mar-1951, 72 y.o., female Today's Date: 01/31/2023  PCP:  Garth Bigness, MD   REFERRING PROVIDER:  Garth Bigness, MD      END OF SESSION:   PT End of Session - 01/31/23 1532     Visit Number 22    Date for PT Re-Evaluation 03/19/23    Authorization Type 36 visit total (PT)    Authorization Time Period 8 visits 4/20-6/17/24    Authorization - Visit Number 1    Authorization - Number of Visits 8    Progress Note Due on Visit 30    PT Start Time 1447    PT Stop Time 1532    PT Time Calculation (min) 45 min    Activity Tolerance Patient tolerated treatment well    Behavior During Therapy [facility] for tasks assessed/performed                               Past Medical History:  Diagnosis Date   Atypical chest pain 07/19/2021   Bigeminy    no current med.   Breast cancer (HCC) 06/2017   right   Bruises easily    CAD in native artery 10/31/2022   Carotid bruit 11/13/2019   Dental crowns present    History of diverticulitis    Hypertension    states under control with med., has been on med. x 5 yr.   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   Past Surgical History:  Procedure Laterality Date   ABDOMINAL HYSTERECTOMY     partial   BREAST LUMPECTOMY Right 06/22/2017   Malignant   BREAST LUMPECTOMY WITH RADIOACTIVE SEED AND SENTINEL LYMPH NODE BIOPSY Right 06/22/2017   Procedure: RIGHT BREAST LUMPECTOMY WITH RADIOACTIVE SEED AND RIGHT AXILLARY DEEP SENTINEL LYMPH NODE BIOPSY WITH BLUE DYE INJECTION;  Surgeon: Claud Kelp, MD;  Location: [facility];  Service: General;  Laterality:  Right;   BREAST LUMPECTOMY WITH RADIOACTIVE SEED LOCALIZATION Left 06/22/2017   Procedure: LEFT BREAST LUMPECTOMY WITH RADIOACTIVE SEED LOCALIZATION;  Surgeon: Claud Kelp, MD;  Location: [facility];  Service: General;  Laterality: Left;   CATARACT EXTRACTION W/ INTRAOCULAR LENS  IMPLANT, BILATERAL Bilateral    EXCISION OF BREAST BIOPSY Left 06/22/2017   benign   LOOP RECORDER INSERTION N/A 05/16/2022   Procedure: LOOP RECORDER INSERTION;  Surgeon: Lanier Prude, MD;  Location: [facility];  Service: Cardiovascular;  Laterality: N/A;   PERCUTANEOUS PINNING Right 06/22/2022   Procedure: PERCUTANEOUS PINNING OF RIGHT HIP;  Surgeon: Bjorn Pippin, MD;  Location: [facility];  Service: Orthopedics;  Laterality: Right;   TONSILLECTOMY     age 37   Patient Active Problem List   Diagnosis Date Noted   Right spastic hemiplegia (HCC) 12/14/2022   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   CAD in native artery 10/31/2022   Malnutrition of moderate degree 06/22/2022   Closed fracture of femur, neck (HCC) 06/21/2022   Urinary frequency 06/21/2022   Hypokalemia 06/21/2022   Heart murmur 06/21/2022   Hypothyroidism 06/21/2022   Dysphagia 05/17/2022   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   CVA (cerebral vascular  accident) (HCC) 05/12/2022   Osteoporosis 01/24/2022   Atypical chest pain 07/19/2021   Temporomandibular joint (TMJ) pain 07/16/2020   Referred otalgia of both ears 07/16/2020   PAC (premature atrial contraction) 05/11/2020   PVC (premature ventricular contraction) 05/11/2020   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   Carotid bruit 11/13/2019   Pure hypercholesterolemia 11/13/2019   Malignant neoplasm of upper-outer quadrant of right breast in female, estrogen receptor positive (HCC) 05/22/2017   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   Diplopia 09/17/2014   Dizziness and giddiness 09/17/2014   Essential hypertension 07/22/2014   Chest pain 07/22/2014    Family history of heart disease 07/22/2014    REFERRING DIAG:  R29.6 (ICD-10-CM) - Repeated falls, closed fracture of Rt hip    THERAPY DIAG:  Muscle weakness (generalized)  Unsteadiness on feet  Other abnormalities of gait and mobility  Rationale for Evaluation and Treatment Rehabilitation  PERTINENT HISTORY: hypertension, breast cancer, hyperlipidemia, Lt hip fracture secondary to fall 06/2022, CVA 06/2022   PRECAUTIONS:  Fall, Rt breast cancer with node removal, osteoporosis    SUBJECTIVE:                                                                                                                                                                                      SUBJECTIVE STATEMENT:  I have messaged with the MD.  Weakness is expected after Botox and should get better with time.      PAIN:  Are you having pain? Not right now but my right hand and arm can be very sore at times.    OBJECTIVE: (objective measures completed at initial evaluation unless otherwise dated)  DIAGNOSTIC FINDINGS: Rt hip fracture and ORIF     COGNITION: Overall cognitive status: Within functional limits for tasks assessed                         SENSATION: WFL     POSTURE: rounded shoulders, forward head, flexed trunk , and weight shift left   PALPATION: NA   LOWER EXTREMITY ROM: Rt hip limited by 50%, hamstrings limited by 50% bil.    LOWER EXTREMITY MMT:   MMT Right eval Right  09/27/22 Left eval Right 10/30/22 Right  01/17/23 Left 10/30/22 Left 01/17/23  Hip flexion 3+ 4- 4- 4- 4 4- 4+  Hip extension           Hip abduction 3-  4    4  Hip adduction           Hip internal rotation           Hip external rotation  Knee flexion 3- 4- 3+  4  4+  Knee extension 3+ 4 4- 4 painful hip 4+ 4- 4+  Ankle dorsiflexion 3+ 4- 4- 4-  4   Ankle plantarflexion           Ankle inversion           Ankle eversion            (Blank rows = not tested)   FUNCTIONAL TESTS:    Eval: Timed up and go (TUG): 1 min, 27 seconds   09/27/22: 5x sit to stand: 29 seconds with use of Lt hand  TUG: 27 seconds   10/30/22: 5x sit to stand: 23 sec use of LT hand     TUG: 26 sec with standard cane   11/20/22: 5x sit to stand: 18.68 seconds    TUG: 19.45 without device    3 minute walk test: 225 feet with a standard cane   01/10/23: 5x sit to stand: 15.77 seconds   01/17/23: TUG with cane: 15.41 seconds with cane    3 min walk test: 313 feet with cane  GAIT: Distance walked: 25 feet  Assistive device utilized: Hemi walker on Lt Level of assistance: SBA Comments: slow mobility, reduced trunk rotation, reduce Rt LE stance time and reduced Rt arm swing     TODAY'S TREATMENT:    01/31/23: Nustep L4 x 8 min with PT present to monitor and discuss progress - legs only- instructed pt to keep SPM >40-50 Sit to stand: 2x10 holding 5# kettlebell on Lt LAQ 2.5# Rt 2x10, ER 2.5# added 2x10 Tandem stance: Rt and Lt- next to barre 3x15 seconds each standing on balance pad Negotiating hurdles and cobble blocks with gait belt and min UE support by PT.  Step to pattern Standing on balance pad: alternating arm motion to promote trunk rotation and weight shift Gait in // bars with use of mirror for heel strike and symmetry  01/22/23: Nustep L4 x 8 min with PT present to monitor and discuss progress - legs only- instructed pt to keep SPM >35-40 Sit to stand: 2x10 holding 5# kettlebell on Lt LAQ 2.5# Rt 2x10, ER 2.5# added 2x10 Tandem stance: Rt and Lt- next to barre 3x15 seconds each standing on balance pad Negotiating hurdles in // bars: forward with step-to and lateral with step-to with min UE support Standing on balance pad: alternating arm motion to promote trunk rotation and weight shift Gait with cane- good step symmetry and use of cane- around building  01/17/23: Nustep L3 x 8 min with PT present to monitor and discuss progress - legs only- instructed pt to keep SPM >35-40 Sit to  stand: 2x10 holding 5# kettlebell on Lt LAQ 2.5# Rt 2x10 Tandem stance: Rt and Lt- next to barre 3x15 seconds each standing on balance pad Standing on level surface: holding blue weighted ball: D2 diagonals x10 each, standing on balance pad, same UE movement x10 Standing on balance pad: alternating arm motion to promote trunk rotation and weight shift Reassessment complete today Gait with cane- good step symmetry and use of cane 01/10/23: Nustep L3 x 8 min with PT present to monitor and discuss progress - legs only- instructed pt to keep SPM >35-40 Sit to stand: 2x10 holding 5# kettlebell on Lt LAQ 2.5# Rt 2x10 Tandem stance: Rt and Lt- next to barre 3x15 seconds each standing on balance pad Standing on level surface: holding blue weighted ball: D2 diagonals x10 each, standing on balance pad, same  UE movement x10 Standing on balance pad: alternating arm motion to promote trunk rotation and weight shift Stepping over hurdles at barre: forward and lateral, no UE support today with forward over hurdles  Gait with cane- good step symmetry and use of cane   PATIENT EDUCATION:  Education details: Access Code: [email or web address] Person educated: Patient Education method: Explanation, Demonstration, and Handouts Education comprehension: verbalized understanding and returned demonstration   HOME EXERCISE PROGRAM: Access Code: [email or web address] URL: [email or web address] Date: 09/27/2022 Prepared by: Tresa Endo  Exercises - Seated Long Arc Quad  - 3 x daily - 7 x weekly - 2 sets - 10 reps - 5 hold - Seated March   - 3 x daily - 7 x weekly - 3 sets - 10 reps - Seated Heel Toe Raises   - 3 x daily - 7 x weekly - 2 sets - 10 reps - Seated Isometric Hip Adduction with Ball  - 3 x daily - 7 x weekly - 2 sets - 10 reps - Sit to Stand with Armchair  - 2 x daily - 7 x weekly - 2 sets - 5-10 reps - Standing Hip Abduction with Counter Support  - 1 x daily - 7 x weekly - 2 sets - 10 reps - Heel Raises with  Counter Support  - 2 x daily - 7 x weekly - 2 sets - 10 reps  Patient Education - Walking with a Single [employer] - Modified 4 Point Gait Pattern  ASSESSMENT:   CLINICAL IMPRESSION:    Pt has advanced to higher level tasks with additional weight and challenge in the clinic.  Pt requires min to no UE support to negotiating hurdles today.  PT provided cueing for step length and heel strike with gait to improve symmetry.  Mirror feedback was used in the // bars.  Pt reports improved endurance and confidence with balance tasks at home and in the community.  Patient will benefit from skilled PT to address the below impairments and improve overall function.   OBJECTIVE IMPAIRMENTS: Abnormal gait, decreased activity tolerance, decreased balance, decreased mobility, difficulty walking, decreased strength, decreased safety awareness, impaired perceived functional ability, impaired flexibility, postural dysfunction, and pain.    ACTIVITY LIMITATIONS: carrying, lifting, sitting, standing, stairs, transfers, hygiene/grooming, and locomotion level   PARTICIPATION LIMITATIONS: meal prep, cleaning, laundry, driving, shopping, and community activity   PERSONAL FACTORS: Age, Past/current experiences, and 1-2 comorbidities: CVA, falls, Rt hip fracture with ORIF  are also affecting patient's functional outcome.    REHAB POTENTIAL: Good   CLINICAL DECISION MAKING: Evolving/moderate complexity   EVALUATION COMPLEXITY: Moderate     GOALS: Goals reviewed with patient? Yes   SHORT TERM GOALS: Target date: 09/06/2022   Be independent in initial HEP Baseline: Goal status: Goal met 08/14/22   2.  Improve LE strength to perform sit to stand with moderate Lt UE support Baseline:  Goal status: Goal met 08/28/22  3.  Perform TUG in < or = to 60 seconds to reduce falls risk Baseline: 27 seconds (09/27/22) Goal status: MET      LONG TERM GOALS: Target date:  03/19/23   Be independent in advanced  HEP Baseline: independent in current HEP and further progress is needed  Goal status: in progress    2.  Perform TUG in < or = to 13 seconds to reduce falls risk Baseline: 15.41 seconds (01/17/23) Goal status: REVISED   3.  ambulate > or = to 375 feet in 3 minutes to improve community  ambulation and independence  Baseline: 313 (01/17/23) Goal status: REVISED    4.  Ambulate with hemi walker with supervision or no guard due to improved gait Baseline: able to do this Goal status: MET   5.  perform 5x sit to stand in < or = to 14 seconds to reduce falls risk  Baseline: 15.77 seconds (01/10/23)  Goal Status: In progress   6. Ambulate with cane for all distances and demonstrate independence due to improved stability  Baseline: using walker   Goal status: MET     PLAN:   PT FREQUENCY: 1x/week   PT DURATION: 8 weeks   PLANNED INTERVENTIONS: Therapeutic exercises, Therapeutic activity, Neuromuscular re-education, Balance training, Gait training, Patient/Family education, Self Care, Joint mobilization, Stair training, Dry Needling, Electrical stimulation, Cryotherapy, Moist heat, Taping, Manual therapy, and Re-evaluation   PLAN FOR NEXT SESSION: Continue to work on gait , strength and balance.   Lorrene Reid, PT 01/31/23 3:34 PM

## 2023-02-01 ENCOUNTER — Ambulatory Visit: Payer: Medicare PPO | Attending: Family Medicine | Admitting: Occupational Therapy

## 2023-02-01 DIAGNOSIS — M79601 Pain in right arm: Secondary | ICD-10-CM

## 2023-02-01 DIAGNOSIS — M25611 Stiffness of right shoulder, not elsewhere classified: Secondary | ICD-10-CM

## 2023-02-01 DIAGNOSIS — M6281 Muscle weakness (generalized): Secondary | ICD-10-CM

## 2023-02-01 DIAGNOSIS — I69354 Hemiplegia and hemiparesis following cerebral infarction affecting left non-dominant side: Secondary | ICD-10-CM | POA: Diagnosis not present

## 2023-02-01 DIAGNOSIS — R278 Other lack of coordination: Secondary | ICD-10-CM | POA: Insufficient documentation

## 2023-02-01 NOTE — Therapy (Signed)
OUTPATIENT OCCUPATIONAL THERAPY  Treatment Session   Patient Name: Angela Buchanan MRN: [id number] DOB:1951-03-15, 72 y.o., female Today's Date: 02/01/2023  PCP: Shon Hale, MD REFERRING PROVIDER: Shon Hale, MD       END OF SESSION:  OT End of Session - 02/01/23 1232     Visit Number 27    Number of Visits 41    Date for OT Re-Evaluation 02/23/23    Authorization Type [employer]    OT Start Time 1148    OT Stop Time 1230    OT Time Calculation (min) 42 min                                   Past Medical History:  Diagnosis Date   Atypical chest pain 07/19/2021   Bigeminy    no current med.   Breast cancer (HCC) 06/2017   right   Bruises easily    CAD in native artery 10/31/2022   Carotid bruit 11/13/2019   Dental crowns present    History of diverticulitis    Hypertension    states under control with med., has been on med. x 5 yr.   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   Past Surgical History:  Procedure Laterality Date   ABDOMINAL HYSTERECTOMY     partial   BREAST LUMPECTOMY Right 06/22/2017   Malignant   BREAST LUMPECTOMY WITH RADIOACTIVE SEED AND SENTINEL LYMPH NODE BIOPSY Right 06/22/2017   Procedure: RIGHT BREAST LUMPECTOMY WITH RADIOACTIVE SEED AND RIGHT AXILLARY DEEP SENTINEL LYMPH NODE BIOPSY WITH BLUE DYE INJECTION;  Surgeon: Claud Kelp, MD;  Location: [facility];  Service: General;  Laterality: Right;   BREAST LUMPECTOMY WITH RADIOACTIVE SEED LOCALIZATION Left 06/22/2017   Procedure: LEFT BREAST LUMPECTOMY WITH RADIOACTIVE SEED LOCALIZATION;  Surgeon: Claud Kelp, MD;  Location: [facility];  Service: General;   Laterality: Left;   CATARACT EXTRACTION W/ INTRAOCULAR LENS  IMPLANT, BILATERAL Bilateral    EXCISION OF BREAST BIOPSY Left 06/22/2017   benign   LOOP RECORDER INSERTION N/A 05/16/2022   Procedure: LOOP RECORDER INSERTION;  Surgeon: Lanier Prude, MD;  Location: [facility];  Service: Cardiovascular;  Laterality: N/A;   PERCUTANEOUS PINNING Right 06/22/2022   Procedure: PERCUTANEOUS PINNING OF RIGHT HIP;  Surgeon: Bjorn Pippin, MD;  Location: [facility];  Service: Orthopedics;  Laterality: Right;   TONSILLECTOMY     age 49   Patient Active Problem List   Diagnosis Date Noted   Right spastic hemiplegia (HCC) 12/14/2022   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 05/11/2020   PVC (premature ventricular contraction) 05/11/2020   Ventricular bigeminy  11/13/2019   Carotid bruit 11/13/2019   Pure hypercholesterolemia 11/13/2019   Malignant neoplasm of upper-outer quadrant of right breast in female, estrogen receptor positive (HCC) 05/22/2017   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   Diplopia 09/17/2014   Dizziness and giddiness 09/17/2014   Essential hypertension 07/22/2014   Chest pain 07/22/2014   Family history of heart disease 07/22/2014    ONSET DATE: CVA 05/12/22  REFERRING DIAG: M79.641 (ICD-10-CM) - Pain in right hand  THERAPY DIAG:  Muscle weakness (generalized)  Hemiplegia and hemiparesis following cerebral  infarction affecting left non-dominant side (HCC)  Pain in right arm  Stiffness of right shoulder, not elsewhere classified  Other lack of coordination  Rationale for Evaluation and Treatment: Rehabilitation  SUBJECTIVE:   SUBJECTIVE STATEMENT: Pt reports that she worked on her walking with PT yesterday. Pt accompanied by: self and significant other (Husband, [name])  PERTINENT HISTORY: hypertension, breast cancer, hyperlipidemia, Lt hip fracture secondary to fall 06/2022, CVA 06/2022   PRECAUTIONS: Fall, Rt breast cancer with node removal, osteoporosis  WEIGHT BEARING RESTRICTIONS: No  PAIN:  Are you having pain? Yes: NPRS scale: 5/10 Pain location: upper arm Pain description: constant Aggravating factors: certain movements Relieving factors: advil  FALLS: Has patient fallen in last 6 months? Yes. Number of falls 1 fall, one week after d/c home from SNF  LIVING ENVIRONMENT: Lives with: lives with their spouse Lives in: House/apartment Stairs: Yes: External: 2 steps; on left going up Has following equipment at home: Dan Humphreys - 2 wheeled, Hemi walker, Wheelchair (manual), shower chair, and Grab bars  PLOF: Independent and Independent with basic ADLs  PATIENT GOALS: to get use of my R arm again  OBJECTIVE:   HAND DOMINANCE: Right  ADLs: Transfers/ambulation related to ADLs: Mod I with hemi-walker Eating: using L hand for eating, husband is assisting with cutting up food Grooming: Mod I, using L hand UB Dressing: Mod assist LB Dressing: Max assist Toileting: Mod I Bathing: Supervision - Mod I Tub Shower transfers: Min assist Equipment: Shower seat with back and Grab bars  UPPER EXTREMITY ROM:    Active ROM Right eval Left eval Right 10/18/22  Right 11/15/22 Right 12/18/22 Right 02/01/23  Shoulder flexion 54   82 86 84  Shoulder abduction        Shoulder adduction        Shoulder extension        Shoulder internal rotation        Shoulder external rotation         Elbow flexion 108   132 136 108  Elbow extension -24   -12 -12 -8  Wrist flexion    38 36 14  Wrist extension    18 24 34  Wrist ulnar deviation        Wrist radial deviation        Wrist pronation        Wrist supination        (Blank rows = not tested)  Passive ROM Right eval Left eval Right 10/18/22 Right 11/15/22 Right 12/18/22 Right 02/01/23  Shoulder flexion 65  76 86 94 ~90  Shoulder abduction        Shoulder adduction        Shoulder extension        Shoulder internal rotation        Shoulder external rotation        Elbow flexion      WNL  Elbow extension -17  -15 -10  0 WNL  Wrist flexion 46  56 60 60 40  Wrist extension 27  50 40 56 60  Wrist ulnar deviation      30  Wrist radial deviation      12  Wrist pronation        Wrist supination        (Blank rows = not tested)  Passive ROM Right 10/18/22 Right 11/15/22 Right 12/18/22 Right 01/08/23  Thumb MCP (0-60)      Thumb IP (0-80)      Thumb Radial abd/add (0-55)      Thumb Palmar abd/add (0-45)      Thumb Opposition to Small Finger      Index MCP (0-90)  0    Index PIP (0-100)  -5    Index DIP (0-70)   -3    Long MCP (0-90)       Long PIP (0-100) -30  -30 -30 -30  Long DIP (0-70) -20 -20 -5 -6  Ring MCP (0-90)       Ring PIP (0-100) -25  -21 -30 -24  Ring DIP (0-70) -15  -8 -7 -6  Little MCP (0-90)       Little PIP (0-100)   -18    Little DIP (0-70)   -4    (Blank rows = not tested)   UPPER EXTREMITY MMT:   not assessed due to pain with any PROM, AROM    HAND FUNCTION: Loose gross grasp  R gross grasp: 5#; L gross grasp: 34# 01/08/23: R: 6# gross grasp, lateral pinch 6# (L: 11#), tip to tip R: 2# (L: 8#) 01/30/23: R: 2# gross grasp  COORDINATION: Box and Blocks:  Right 2blocks, Left 45 blocks. Modified to complete with RUE.  Pt able to grasp and move across to other side of box with barrier removed.   11/01/22: RUE 13 blocks 12/18/22: RUE 19 blocks  SENSATION: Mild decrease in R  EDEMA: mild  edema in wrist hand, and fingers on RUE  OBSERVATIONS: Pt guarded with movement, hypersensitive to touch and PROM.     TODAY'S TREATMENT:                                   02/01/23 Measurements taken, see above. Coordination: flipping large blocks with focus on grasp and release and forearm supination.  Pt utilizing gross grasp but able to flip blocks with partial supination and use of table to assist.  OT then challenged pt to stack blocks with focus on grasp/release and increased shoulder flexion to stack on progressively higher stack.  Pt demonstrating ability to achieve ~80* shoulder flexion against gravity, however utilizing slight trunk lean to L to compensate for decreased shoulder flexion.  Pt then removing stacked blocks with focus on sustained grasp and squeeze to remove blocks from stack.  OT providing intermittent tactile cues at torso to decrease L lean and cues for open of hand when releasing blocks. Folding towels: engaged in folding towels with focus on shoulder flexion and elbow flexion/extension as pt demonstrating increased elbow extension but decreased flexion s/p botox injection.  Pt limited by decreased grip on smaller towel (compared to blocks) but able to complete with gross mobility with RUE.    01/30/23 Kinesiotape: removed previous kinesiotape and applied kinesiotape to R forearm (anchored at lateral epicondyle and applied to forearm with 20-35% tension past DIP with fist in flexion and slightly deviated radially)  and fingers to facilitate finger extension.  Reviewed typical length of wear and purpose of kinesiotape. UE ROM: Completed chest press, shoulder flexion ~80*, horizontal abduction/adduction with dowel x8 with focus on quality of movement.  Pt continues to demonstrate decreased ROM and strength, however able to complete exercises within 75% of how she was completing them prior to botox.  Reiterated importance of resumption of exercises as pain as decreased to  manageable. Hand exercise: hand opening/closing with focus on AROM and then PROM to further stretch fingers into extension.  Encouraged pt to complete on table top to allow for increased focus and isolation on hand movements.    01/25/23 UE use: Attempted to pick up blocks with R hand, however pt with inability to complete due to decreased strength in shoulder and fingers to maintain grasp and to translate items.  After application of kinesiotape engaged in blocked practice with focus on opening hand and sustained grasp to pick up 1" blocks.  Pt able to maintain loose grasp on blocks to place on target to R to facilitate shoulder abduction and flexion.  OT encouraged pt to resume towel slides and functional grasp and transferring of items as pt demonstrating improvements with repetition. Kinesiotape: applied kinesiotape to R forearm (anchored at lateral epicondyle and applied to forearm with 20-35% tension past DIP with fist in flexion and slightly deviated radially) and fingers to facilitate finger extension.  Reviewed typical length of wear and purpose of kinesiotape.   PATIENT EDUCATION: Education details: ongoing condition specific education and progressive exercises. Person educated: Patient and Spouse Education method: Explanation, Demonstration, Tactile cues, Verbal cues, and Handouts Education comprehension: verbalized understanding and needs further education  HOME EXERCISE PROGRAM: Access Code: [email or web address] URL: [email or web address] Date: 01/30/2023 Prepared by: [facility] - [facility]  Exercises - Supine Shoulder Flexion AAROM with Dowel  - 1 x daily - 3 x weekly - 2 sets - 10 reps - Supine Shoulder Protraction with Dowel  - 1 x daily - 3 x weekly - 2 sets - 10 reps - Supine Shoulder Abduction AAROM with Dowel  - 1 x daily - 3 x weekly - 2 sets - 10 reps - Touch finger to nose  - 1 x daily - 3 x weekly - 2 sets - 10 reps - 5-10 sec hold - Touch  finger to nose  - 1 x daily - 3 x weekly - 2 sets - 10 reps - Closing and Opening Hand With Arm Supported  - 2 x daily - 7 x weekly - 3 sets - 10 reps - Hooklying Single Arm Chest Press  - 1 x daily - 3 x weekly - 2 sets - 10 reps - Thumb AROM Opposition  - 1 x daily - 7 x weekly - 1 sets - 5 reps - Seated Finger Composite Flexion Stretch  - 1 x daily - 7 x weekly - 1 sets - 5 reps - Hand PROM Finger Extension  - 1 x daily - 7 x weekly - 1 sets - 10 reps - Seated Forearm Pronation Supination AROM  - 1 x daily - 7 x weekly - 1 sets - 10 reps - Seated Wrist Flexion Extension PROM  - 1 x daily - 7 x weekly - 1 sets - 10 reps   GOALS: Goals reviewed with patient? Yes  SHORT TERM GOALS: Target date: 02/02/23  Pt and spouse will be independent with updated ROM and coordination HEP. Baseline: Goal status: IN Progress  2.  Pt will verbalize understanding of task modifications and/or potential AE needs to increase ease, safety, and independence w/ ADLs. Baseline: reinstated as pt to receive botox and will benefit from additional education with improved function Goal status: IN Progress    LONG TERM GOALS:  Target date: 02/23/23  1.  Pt will demonstrate ability to demonstrate improved functional grasp in RUE to open new jar, increased handwriting, and self-feeding. Baseline:  Goal status: IN Progress  2.  Pt will demonstrate improved UE functional use for ADLs as evidenced by increasing box/ blocks score by 5 blocks with RUE, to be completed in standardized manner. Baseline:  19 blocks with RUE Goal status: IN Progress  3.  Pt will demonstrate improved shoulder ROM to allow for reach to moderate height as needed for ADLs/IADLs (washing back, putting on jacket, etc).  Baseline:  Goal status: IN Progress  4. Pt will demonstrate improvement of finger extension by 15* TAM of long and ringer finger to allow for more functional hand opening.  Baseline: -36 long finger, -30 ring finger  Goal  status: IN Progress  5. Pt will demonstrate improved functional grasp by 5# with RUE to allow for increased independence with cutting foods and opening food containers.  Baseline: R: 6#  Goal status: IN Progress  ASSESSMENT:  CLINICAL IMPRESSION: Pt continues to report pain in shoulder and biceps with elbow flexion, demonstrating decreased elbow flexion but increased elbow extension and wrist extension s/p botox injection.  OT reiterated decreased spasticity in hand and forearm. Pt demonstrating shoulder flexion to ~80* during functional reaching task and folding towels with no c/o pain. OT encouraging functional use of RUE during household tasks, such as folding laundry with focus on elbow flexion/extension and gross grasp.  PERFORMANCE DEFICITS: in functional skills including ADLs, IADLs, coordination, sensation, edema, tone, ROM, strength, pain, flexibility, Fine motor control, Gross motor control, balance, body mechanics, endurance, decreased knowledge of use of DME, skin integrity, vision, and UE functional use, cognitive skills including emotional and safety awareness, and psychosocial skills including coping strategies.   IMPAIRMENTS: are limiting patient from ADLs, IADLs, and rest and sleep.   CO-MORBIDITIES: may have co-morbidities  that affects occupational performance. Patient will benefit from skilled OT to address above impairments and improve overall function.  MODIFICATION OR ASSISTANCE TO COMPLETE EVALUATION: Min-Moderate modification of tasks or assist with assess necessary to complete an evaluation.  OT OCCUPATIONAL PROFILE AND HISTORY: Detailed assessment: Review of records and additional review of physical, cognitive, psychosocial history related to current functional performance.  CLINICAL DECISION MAKING: Moderate - several treatment options, min-mod task modification necessary  REHAB POTENTIAL: Good  EVALUATION COMPLEXITY: Moderate    PLAN:  OT FREQUENCY:  2x/week  OT DURATION: 6 weeks  PLANNED INTERVENTIONS: self care/ADL training, therapeutic exercise, therapeutic activity, neuromuscular re-education, manual therapy, passive range of motion, balance training, functional mobility training, splinting, paraffin, fluidotherapy, biofeedback, compression bandaging, moist heat, cryotherapy, contrast bath, patient/family education, visual/perceptual remediation/compensation, psychosocial skills training, coping strategies training, and DME and/or AE instructions  RECOMMENDED OTHER SERVICES: NA  CONSULTED AND AGREED WITH PLAN OF CARE: Patient and family member/caregiver  PLAN FOR NEXT SESSION: AAROM or self-ROM to include shoulder flexion/extension and internal/external rotation, elbow, forearm.  Functional grasp and release.     Rosalio Loud, OTR/L 02/01/2023, 12:33 PM

## 2023-02-05 NOTE — Progress Notes (Signed)
[place] LOOP RECORDER  

## 2023-02-06 ENCOUNTER — Encounter: Payer: Medicare PPO | Admitting: Occupational Therapy

## 2023-02-06 ENCOUNTER — Ambulatory Visit: Payer: Medicare PPO | Admitting: Occupational Therapy

## 2023-02-07 ENCOUNTER — Ambulatory Visit: Payer: Medicare PPO

## 2023-02-07 DIAGNOSIS — R293 Abnormal posture: Secondary | ICD-10-CM | POA: Diagnosis not present

## 2023-02-07 DIAGNOSIS — M6281 Muscle weakness (generalized): Secondary | ICD-10-CM | POA: Diagnosis not present

## 2023-02-07 DIAGNOSIS — R2689 Other abnormalities of gait and mobility: Secondary | ICD-10-CM | POA: Diagnosis not present

## 2023-02-07 DIAGNOSIS — R2681 Unsteadiness on feet: Secondary | ICD-10-CM | POA: Diagnosis not present

## 2023-02-07 DIAGNOSIS — M79601 Pain in right arm: Secondary | ICD-10-CM | POA: Diagnosis not present

## 2023-02-07 DIAGNOSIS — M25611 Stiffness of right shoulder, not elsewhere classified: Secondary | ICD-10-CM | POA: Diagnosis not present

## 2023-02-07 NOTE — Therapy (Signed)
OUTPATIENT PHYSICAL THERAPY TREATMENT NOTE   Patient Name: Angela Buchanan MRN: [id number] DOB:07/03/1951, 72 y.o., female Today's Date: 02/07/2023  PCP:  Garth Bigness, MD   REFERRING PROVIDER:  Garth Bigness, MD      END OF SESSION:   PT End of Session - 02/07/23 1602     Visit Number 23    Date for PT Re-Evaluation 03/19/23    Authorization Type 36 visit total (PT)    Authorization Time Period 8 visits 4/20-6/17/24    Authorization - Visit Number 2    Authorization - Number of Visits 8    Progress Note Due on Visit 30    PT Start Time 1447    PT Stop Time 1532    PT Time Calculation (min) 45 min    Activity Tolerance Patient tolerated treatment well    Behavior During Therapy [facility] for tasks assessed/performed                                Past Medical History:  Diagnosis Date   Atypical chest pain 07/19/2021   Bigeminy    no current med.   Breast cancer (HCC) 06/2017   right   Bruises easily    CAD in native artery 10/31/2022   Carotid bruit 11/13/2019   Dental crowns present    History of diverticulitis    Hypertension    states under control with med., has been on med. x 5 yr.   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   Past Surgical History:  Procedure Laterality Date   ABDOMINAL HYSTERECTOMY     partial   BREAST LUMPECTOMY Right 06/22/2017   Malignant   BREAST LUMPECTOMY WITH RADIOACTIVE SEED AND SENTINEL LYMPH NODE BIOPSY Right 06/22/2017   Procedure: RIGHT BREAST LUMPECTOMY WITH RADIOACTIVE SEED AND RIGHT AXILLARY DEEP SENTINEL LYMPH NODE BIOPSY WITH BLUE DYE INJECTION;  Surgeon: Claud Kelp, MD;  Location: [facility];  Service: General;  Laterality:  Right;   BREAST LUMPECTOMY WITH RADIOACTIVE SEED LOCALIZATION Left 06/22/2017   Procedure: LEFT BREAST LUMPECTOMY WITH RADIOACTIVE SEED LOCALIZATION;  Surgeon: Claud Kelp, MD;  Location: [facility];  Service: General;  Laterality: Left;   CATARACT EXTRACTION W/ INTRAOCULAR LENS  IMPLANT, BILATERAL Bilateral    EXCISION OF BREAST BIOPSY Left 06/22/2017   benign   LOOP RECORDER INSERTION N/A 05/16/2022   Procedure: LOOP RECORDER INSERTION;  Surgeon: Lanier Prude, MD;  Location: [facility];  Service: Cardiovascular;  Laterality: N/A;   PERCUTANEOUS PINNING Right 06/22/2022   Procedure: PERCUTANEOUS PINNING OF RIGHT HIP;  Surgeon: Bjorn Pippin, MD;  Location: [facility];  Service: Orthopedics;  Laterality: Right;   TONSILLECTOMY     age 73   Patient Active Problem List   Diagnosis Date Noted   Right spastic hemiplegia (HCC) 12/14/2022   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   CAD in native artery 10/31/2022   Malnutrition of moderate degree 06/22/2022   Closed fracture of femur, neck (HCC) 06/21/2022   Urinary frequency 06/21/2022   Hypokalemia 06/21/2022   Heart murmur 06/21/2022   Hypothyroidism 06/21/2022   Dysphagia 05/17/2022   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   CVA (cerebral  vascular accident) (HCC) 05/12/2022   Osteoporosis 01/24/2022   Atypical chest pain 07/19/2021   Temporomandibular joint (TMJ) pain 07/16/2020   Referred otalgia of both ears 07/16/2020   PAC (premature atrial contraction) 05/11/2020   PVC (premature ventricular contraction) 05/11/2020   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   Carotid bruit 11/13/2019   Pure hypercholesterolemia 11/13/2019   Malignant neoplasm of upper-outer quadrant of right breast in female, estrogen receptor positive (HCC) 05/22/2017   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   Diplopia 09/17/2014   Dizziness and giddiness 09/17/2014   Essential hypertension 07/22/2014   Chest pain 07/22/2014    Family history of heart disease 07/22/2014    REFERRING DIAG:  R29.6 (ICD-10-CM) - Repeated falls, closed fracture of Rt hip    THERAPY DIAG:  Muscle weakness (generalized)  Unsteadiness on feet  Other abnormalities of gait and mobility  Abnormal posture  Rationale for Evaluation and Treatment Rehabilitation  PERTINENT HISTORY: hypertension, breast cancer, hyperlipidemia, Lt hip fracture secondary to fall 06/2022, CVA 06/2022   PRECAUTIONS:  Fall, Rt breast cancer with node removal, osteoporosis    SUBJECTIVE:                                                                                                                                                                                      SUBJECTIVE STATEMENT:  I am working on my step length and symmetry.  Sometimes I use my cane to go outside.       PAIN:  Are you having pain? Not right now but my right hand and arm can be very sore at times.    OBJECTIVE: (objective measures completed at initial evaluation unless otherwise dated)  DIAGNOSTIC FINDINGS: Rt hip fracture and ORIF     COGNITION: Overall cognitive status: Within functional limits for tasks assessed                         SENSATION: WFL     POSTURE: rounded shoulders, forward head, flexed trunk , and weight shift left   PALPATION: NA   LOWER EXTREMITY ROM: Rt hip limited by 50%, hamstrings limited by 50% bil.    LOWER EXTREMITY MMT:   MMT Right eval Right  09/27/22 Left eval Right 10/30/22 Right  01/17/23 Left 10/30/22 Left 01/17/23  Hip flexion 3+ 4- 4- 4- 4 4- 4+  Hip extension           Hip abduction 3-  4    4  Hip adduction           Hip internal rotation           Hip external  rotation           Knee flexion 3- 4- 3+  4  4+  Knee extension 3+ 4 4- 4 painful hip 4+ 4- 4+  Ankle dorsiflexion 3+ 4- 4- 4-  4   Ankle plantarflexion           Ankle inversion           Ankle eversion            (Blank rows = not tested)   FUNCTIONAL  TESTS:   Eval: Timed up and go (TUG): 1 min, 27 seconds   09/27/22: 5x sit to stand: 29 seconds with use of Lt hand  TUG: 27 seconds   10/30/22: 5x sit to stand: 23 sec use of LT hand     TUG: 26 sec with standard cane   11/20/22: 5x sit to stand: 18.68 seconds    TUG: 19.45 without device    3 minute walk test: 225 feet with a standard cane   01/10/23: 5x sit to stand: 15.77 seconds   01/17/23: TUG with cane: 15.41 seconds with cane    3 min walk test: 313 feet with cane  GAIT: Distance walked: 25 feet  Assistive device utilized: Hemi walker on Lt Level of assistance: SBA Comments: slow mobility, reduced trunk rotation, reduce Rt LE stance time and reduced Rt arm swing     TODAY'S TREATMENT:    02/07/23: Nustep L4 x 8 min with PT present to monitor and discuss progress - legs only- instructed pt to keep SPM >40-50 Gait with weight on 1 arm for instability- no device 2x20 feet each hand.  2#- PT present for guarding.  D2 with 2# weight in Lt UE in standing to promote rotation x10 Sit to stand: 2x10 holding 5# kettlebell on Lt LAQ 2.5# Rt 2x10, ER 2.5# added 2x10 Weaving in/out of low cones on the floor without device- CGA by PT on gait belt Negotiating hurdles with step to with gait belt CGA by PT  Standing on balance pad: alternating arm motion to promote trunk rotation and weight shift   01/31/23: Nustep L4 x 8 min with PT present to monitor and discuss progress - legs only- instructed pt to keep SPM >40-50 Sit to stand: 2x10 holding 5# kettlebell on Lt LAQ 2.5# Rt 2x10, ER 2.5# added 2x10 Tandem stance: Rt and Lt- next to barre 3x15 seconds each standing on balance pad Negotiating hurdles and cobble blocks with gait belt and min UE support by PT.  Step to pattern Standing on balance pad: alternating arm motion to promote trunk rotation and weight shift Gait in // bars with use of mirror for heel strike and symmetry  01/22/23: Nustep L4 x 8 min with PT present to monitor and  discuss progress - legs only- instructed pt to keep SPM >35-40 Sit to stand: 2x10 holding 5# kettlebell on Lt LAQ 2.5# Rt 2x10, ER 2.5# added 2x10 Tandem stance: Rt and Lt- next to barre 3x15 seconds each standing on balance pad Negotiating hurdles in // bars: forward with step-to and lateral with step-to with min UE support Standing on balance pad: alternating arm motion to promote trunk rotation and weight shift Gait with cane- good step symmetry and use of cane- around building  PATIENT EDUCATION:  Education details: Access Code: [email or web address] Person educated: Patient Education method: Explanation, Facilities manager, and Handouts Education comprehension: verbalized understanding and returned demonstration   HOME EXERCISE PROGRAM: Access Code: [email or web address] URL: [email or web address] Date: 09/27/2022 Prepared  by: Tresa Endo  Exercises - Seated Long Arc Quad  - 3 x daily - 7 x weekly - 2 sets - 10 reps - 5 hold - Seated March   - 3 x daily - 7 x weekly - 3 sets - 10 reps - Seated Heel Toe Raises   - 3 x daily - 7 x weekly - 2 sets - 10 reps - Seated Isometric Hip Adduction with Ball  - 3 x daily - 7 x weekly - 2 sets - 10 reps - Sit to Stand with Armchair  - 2 x daily - 7 x weekly - 2 sets - 5-10 reps - Standing Hip Abduction with Counter Support  - 1 x daily - 7 x weekly - 2 sets - 10 reps - Heel Raises with Counter Support  - 2 x daily - 7 x weekly - 2 sets - 10 reps  Patient Education - Walking with a Single [employer] - Modified 4 Point Gait Pattern  ASSESSMENT:   CLINICAL IMPRESSION:    Pt has advanced to higher level tasks with additional weight and challenge in the clinic.  Pt did weaving in/out of cones and hurdles without UE support with CGA on gait belt by PT. Pt with improved independence with gait and step length with less cueing.   Pt reports improved endurance and confidence with balance tasks at home and in the community.  Patient will benefit from skilled PT to address  the below impairments and improve overall function.   OBJECTIVE IMPAIRMENTS: Abnormal gait, decreased activity tolerance, decreased balance, decreased mobility, difficulty walking, decreased strength, decreased safety awareness, impaired perceived functional ability, impaired flexibility, postural dysfunction, and pain.    ACTIVITY LIMITATIONS: carrying, lifting, sitting, standing, stairs, transfers, hygiene/grooming, and locomotion level   PARTICIPATION LIMITATIONS: meal prep, cleaning, laundry, driving, shopping, and community activity   PERSONAL FACTORS: Age, Past/current experiences, and 1-2 comorbidities: CVA, falls, Rt hip fracture with ORIF  are also affecting patient's functional outcome.    REHAB POTENTIAL: Good   CLINICAL DECISION MAKING: Evolving/moderate complexity   EVALUATION COMPLEXITY: Moderate     GOALS: Goals reviewed with patient? Yes   SHORT TERM GOALS: Target date: 09/06/2022   Be independent in initial HEP Baseline: Goal status: Goal met 08/14/22   2.  Improve LE strength to perform sit to stand with moderate Lt UE support Baseline:  Goal status: Goal met 08/28/22  3.  Perform TUG in < or = to 60 seconds to reduce falls risk Baseline: 27 seconds (09/27/22) Goal status: MET      LONG TERM GOALS: Target date:  03/19/23   Be independent in advanced HEP Baseline: independent in current HEP and further progress is needed  Goal status: in progress    2.  Perform TUG in < or = to 13 seconds to reduce falls risk Baseline: 15.41 seconds (01/17/23) Goal status: REVISED   3.  ambulate > or = to 375 feet in 3 minutes to improve community ambulation and independence  Baseline: 313 (01/17/23) Goal status: REVISED    4.  Ambulate with hemi walker with supervision or no guard due to improved gait Baseline: able to do this Goal status: MET   5.  perform 5x sit to stand in < or = to 14 seconds to reduce falls risk  Baseline: 15.77 seconds (01/10/23)  Goal  Status: In progress   6. Ambulate with cane for all distances and demonstrate independence due to improved stability  Baseline: using walker  Goal status: MET     PLAN:   PT FREQUENCY: 1x/week   PT DURATION: 8 weeks   PLANNED INTERVENTIONS: Therapeutic exercises, Therapeutic activity, Neuromuscular re-education, Balance training, Gait training, Patient/Family education, Self Care, Joint mobilization, Stair training, Dry Needling, Electrical stimulation, Cryotherapy, Moist heat, Taping, Manual therapy, and Re-evaluation   PLAN FOR NEXT SESSION: Continue to work on gait , strength and balance.   Lorrene Reid, PT 02/07/23 4:03 PM

## 2023-02-08 ENCOUNTER — Ambulatory Visit: Payer: Medicare PPO | Admitting: Occupational Therapy

## 2023-02-08 DIAGNOSIS — I69354 Hemiplegia and hemiparesis following cerebral infarction affecting left non-dominant side: Secondary | ICD-10-CM | POA: Diagnosis not present

## 2023-02-08 DIAGNOSIS — R278 Other lack of coordination: Secondary | ICD-10-CM

## 2023-02-08 DIAGNOSIS — M25611 Stiffness of right shoulder, not elsewhere classified: Secondary | ICD-10-CM | POA: Diagnosis not present

## 2023-02-08 DIAGNOSIS — M79601 Pain in right arm: Secondary | ICD-10-CM

## 2023-02-08 DIAGNOSIS — M6281 Muscle weakness (generalized): Secondary | ICD-10-CM | POA: Diagnosis not present

## 2023-02-08 NOTE — Therapy (Signed)
OUTPATIENT OCCUPATIONAL THERAPY  Treatment Session   Patient Name: KATHRYNN EAR MRN: [id number] DOB:12-21-1950, 72 y.o., female Today's Date: 02/08/2023  PCP: Shon Hale, MD REFERRING PROVIDER: Shon Hale, MD       END OF SESSION:  OT End of Session - 02/08/23 1453     Visit Number 28    Number of Visits 41    Date for OT Re-Evaluation 02/23/23    Authorization Type [employer]    OT Start Time 1448    OT Stop Time 1528    OT Time Calculation (min) 40 min                                    Past Medical History:  Diagnosis Date   Atypical chest pain 07/19/2021   Bigeminy    no current med.   Breast cancer (HCC) 06/2017   right   Bruises easily    CAD in native artery 10/31/2022   Carotid bruit 11/13/2019   Dental crowns present    History of diverticulitis    Hypertension    states under control with med., has been on med. x 5 yr.   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   Past Surgical History:  Procedure Laterality Date   ABDOMINAL HYSTERECTOMY     partial   BREAST LUMPECTOMY Right 06/22/2017   Malignant   BREAST LUMPECTOMY WITH RADIOACTIVE SEED AND SENTINEL LYMPH NODE BIOPSY Right 06/22/2017   Procedure: RIGHT BREAST LUMPECTOMY WITH RADIOACTIVE SEED AND RIGHT AXILLARY DEEP SENTINEL LYMPH NODE BIOPSY WITH BLUE DYE INJECTION;  Surgeon: Claud Kelp, MD;  Location: [facility];  Service: General;  Laterality: Right;   BREAST LUMPECTOMY WITH RADIOACTIVE SEED LOCALIZATION Left 06/22/2017   Procedure: LEFT BREAST LUMPECTOMY WITH RADIOACTIVE SEED LOCALIZATION;  Surgeon: Claud Kelp, MD;  Location: [facility];  Service: General;   Laterality: Left;   CATARACT EXTRACTION W/ INTRAOCULAR LENS  IMPLANT, BILATERAL Bilateral    EXCISION OF BREAST BIOPSY Left 06/22/2017   benign   LOOP RECORDER INSERTION N/A 05/16/2022   Procedure: LOOP RECORDER INSERTION;  Surgeon: Lanier Prude, MD;  Location: [facility];  Service: Cardiovascular;  Laterality: N/A;   PERCUTANEOUS PINNING Right 06/22/2022   Procedure: PERCUTANEOUS PINNING OF RIGHT HIP;  Surgeon: Bjorn Pippin, MD;  Location: [facility];  Service: Orthopedics;  Laterality: Right;   TONSILLECTOMY     age 60   Patient Active Problem List   Diagnosis Date Noted   Right spastic hemiplegia (HCC) 12/14/2022   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 05/11/2020   PVC (premature ventricular contraction) 05/11/2020   Ventricular  bigeminy 11/13/2019   Carotid bruit 11/13/2019   Pure hypercholesterolemia 11/13/2019   Malignant neoplasm of upper-outer quadrant of right breast in female, estrogen receptor positive (HCC) 05/22/2017   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   Diplopia 09/17/2014   Dizziness and giddiness 09/17/2014   Essential hypertension 07/22/2014   Chest pain 07/22/2014   Family history of heart disease 07/22/2014    ONSET DATE: CVA 05/12/22  REFERRING DIAG: M79.641 (ICD-10-CM) - Pain in right hand  THERAPY DIAG:  Hemiplegia and hemiparesis following cerebral infarction affecting left non-dominant  side (HCC)  Pain in right arm  Stiffness of right shoulder, not elsewhere classified  Other lack of coordination  Rationale for Evaluation and Treatment: Rehabilitation  SUBJECTIVE:   SUBJECTIVE STATEMENT: Pt reports that she's been trying to use her arm a lot, in every day life. Pt accompanied by: self and significant other (Husband, [name])  PERTINENT HISTORY: hypertension, breast cancer, hyperlipidemia, Lt hip fracture secondary to fall 06/2022, CVA 06/2022   PRECAUTIONS: Fall, Rt breast cancer with node removal, osteoporosis  WEIGHT BEARING RESTRICTIONS: No  PAIN:  Are you having pain? Yes: NPRS scale: 4-5/10 Pain location: upper arm Pain description: constant Aggravating factors: certain movements Relieving factors: advil  FALLS: Has patient fallen in last 6 months? Yes. Number of falls 1 fall, one week after d/c home from SNF  LIVING ENVIRONMENT: Lives with: lives with their spouse Lives in: House/apartment Stairs: Yes: External: 2 steps; on left going up Has following equipment at home: Dan Humphreys - 2 wheeled, Hemi walker, Wheelchair (manual), shower chair, and Grab bars  PLOF: Independent and Independent with basic ADLs  PATIENT GOALS: to get use of my R arm again  OBJECTIVE:   HAND DOMINANCE: Right  ADLs: Transfers/ambulation related to ADLs: Mod I with hemi-walker Eating: using L hand for eating, husband is assisting with cutting up food Grooming: Mod I, using L hand UB Dressing: Mod assist LB Dressing: Max assist Toileting: Mod I Bathing: Supervision - Mod I Tub Shower transfers: Min assist Equipment: Shower seat with back and Grab bars  UPPER EXTREMITY ROM:    Active ROM Right eval Left eval Right 10/18/22  Right 11/15/22 Right 12/18/22 Right 02/01/23  Shoulder flexion 54   82 86 84  Shoulder abduction        Shoulder adduction        Shoulder extension        Shoulder internal rotation        Shoulder external rotation        Elbow flexion 108    132 136 108  Elbow extension -24   -12 -12 -8  Wrist flexion    38 36 14  Wrist extension    18 24 34  Wrist ulnar deviation        Wrist radial deviation        Wrist pronation        Wrist supination        (Blank rows = not tested)  Passive ROM Right eval Left eval Right 10/18/22 Right 11/15/22 Right 12/18/22 Right 02/01/23  Shoulder flexion 65  76 86 94 ~90  Shoulder abduction        Shoulder adduction        Shoulder extension        Shoulder internal rotation        Shoulder external rotation        Elbow flexion      WNL  Elbow extension -17  -  15 -10 0 WNL  Wrist flexion 46  56 60 60 40  Wrist extension 27  50 40 56 60  Wrist ulnar deviation      30  Wrist radial deviation      12  Wrist pronation        Wrist supination        (Blank rows = not tested)  Passive ROM Right 10/18/22 Right 11/15/22 Right 12/18/22 Right 01/08/23  Thumb MCP (0-60)      Thumb IP (0-80)      Thumb Radial abd/add (0-55)      Thumb Palmar abd/add (0-45)      Thumb Opposition to Small Finger      Index MCP (0-90)  0    Index PIP (0-100)  -5    Index DIP (0-70)   -3    Long MCP (0-90)       Long PIP (0-100) -30  -30 -30 -30  Long DIP (0-70) -20 -20 -5 -6  Ring MCP (0-90)       Ring PIP (0-100) -25  -21 -30 -24  Ring DIP (0-70) -15  -8 -7 -6  Little MCP (0-90)       Little PIP (0-100)   -18    Little DIP (0-70)   -4    (Blank rows = not tested)   UPPER EXTREMITY MMT:   not assessed due to pain with any PROM, AROM    HAND FUNCTION: Loose gross grasp  R gross grasp: 5#; L gross grasp: 34# 01/08/23: R: 6# gross grasp, lateral pinch 6# (L: 11#), tip to tip R: 2# (L: 8#) 01/30/23: R: 2# gross grasp  COORDINATION: Box and Blocks:  Right 2blocks, Left 45 blocks. Modified to complete with RUE.  Pt able to grasp and move across to other side of box with barrier removed.   11/01/22: RUE 13 blocks 12/18/22: RUE 19 blocks  SENSATION: Mild decrease in R  EDEMA: mild edema in wrist hand, and  fingers on RUE  OBSERVATIONS: Pt guarded with movement, hypersensitive to touch and PROM.     TODAY'S TREATMENT:                                   02/08/23 Shoulder ROM/forward reaching: engaged in reaching forward for targets on vertical dowel from seated position to facilitate increased shoulder flexion against gravity.  After towel slides, engaged in shoulder flexion with UE Ranger in standing with UE Ranger positioned in wall anchor.  Pt able to achieve 80-90* shoulder flexion against gravity, OT providing support at hand portion of UE Ranger to stabilize and facilitate ROM. Towel slides: engaged in shoulder flexion with towel on table top with focus on increased shoulder flexion in gravity eliminated position.  Pt able to facilitate 100-120* flexion at table top. UE ROM: Reviewed wrist flexion/extension with forearm positioned on tabletop to allow for increased ROM, OT demonstrating PROM with pt able to replicate with no reports of pain with either direction.  Reiterated composite finger flexion with pt demonstrating good technique, lacking ~25-30% of movement especially at DIP. Coban wrapping: OT wrapped R digits into fist position with coban.  OT educated on only wearing it 10-15 mins to provide sustained stretch in position but then to remove.  OT encouraged pt to continue with PROM of composite finger flexion with plan to attempt coban wrapping vs alternative splint wear to facilitate increased finger  flexion.   02/01/23 Measurements taken, see above. Coordination: flipping large blocks with focus on grasp and release and forearm supination.  Pt utilizing gross grasp but able to flip blocks with partial supination and use of table to assist.  OT then challenged pt to stack blocks with focus on grasp/release and increased shoulder flexion to stack on progressively higher stack.  Pt demonstrating ability to achieve ~80* shoulder flexion against gravity, however utilizing slight trunk lean to L to  compensate for decreased shoulder flexion.  Pt then removing stacked blocks with focus on sustained grasp and squeeze to remove blocks from stack.  OT providing intermittent tactile cues at torso to decrease L lean and cues for open of hand when releasing blocks. Folding towels: engaged in folding towels with focus on shoulder flexion and elbow flexion/extension as pt demonstrating increased elbow extension but decreased flexion s/p botox injection.  Pt limited by decreased grip on smaller towel (compared to blocks) but able to complete with gross mobility with RUE.    01/30/23 Kinesiotape: removed previous kinesiotape and applied kinesiotape to R forearm (anchored at lateral epicondyle and applied to forearm with 20-35% tension past DIP with fist in flexion and slightly deviated radially) and fingers to facilitate finger extension.  Reviewed typical length of wear and purpose of kinesiotape. UE ROM: Completed chest press, shoulder flexion ~80*, horizontal abduction/adduction with dowel x8 with focus on quality of movement.  Pt continues to demonstrate decreased ROM and strength, however able to complete exercises within 75% of how she was completing them prior to botox.  Reiterated importance of resumption of exercises as pain as decreased to manageable. Hand exercise: hand opening/closing with focus on AROM and then PROM to further stretch fingers into extension.  Encouraged pt to complete on table top to allow for increased focus and isolation on hand movements.    PATIENT EDUCATION: Education details: ongoing condition specific education and progressive exercises. Person educated: Patient and Spouse Education method: Explanation, Demonstration, Tactile cues, Verbal cues, and Handouts Education comprehension: verbalized understanding and needs further education  HOME EXERCISE PROGRAM: Access Code: [email or web address] URL: [email or web address] Date: 01/30/2023 Prepared by: [facility] - Outpatient   Rehab - [facility]  Exercises - Supine Shoulder Flexion AAROM with Dowel  - 1 x daily - 3 x weekly - 2 sets - 10 reps - Supine Shoulder Protraction with Dowel  - 1 x daily - 3 x weekly - 2 sets - 10 reps - Supine Shoulder Abduction AAROM with Dowel  - 1 x daily - 3 x weekly - 2 sets - 10 reps - Touch finger to nose  - 1 x daily - 3 x weekly - 2 sets - 10 reps - 5-10 sec hold - Touch finger to nose  - 1 x daily - 3 x weekly - 2 sets - 10 reps - Closing and Opening Hand With Arm Supported  - 2 x daily - 7 x weekly - 3 sets - 10 reps - Hooklying Single Arm Chest Press  - 1 x daily - 3 x weekly - 2 sets - 10 reps - Thumb AROM Opposition  - 1 x daily - 7 x weekly - 1 sets - 5 reps - Seated Finger Composite Flexion Stretch  - 1 x daily - 7 x weekly - 1 sets - 5 reps - Hand PROM Finger Extension  - 1 x daily - 7 x weekly - 1 sets - 10 reps - Seated Forearm Pronation Supination AROM  - 1  x daily - 7 x weekly - 1 sets - 10 reps - Seated Wrist Flexion Extension PROM  - 1 x daily - 7 x weekly - 1 sets - 10 reps   GOALS: Goals reviewed with patient? Yes  SHORT TERM GOALS: Target date: 02/02/23  Pt and spouse will be independent with updated ROM and coordination HEP. Baseline: Goal status: IN Progress  2.  Pt will verbalize understanding of task modifications and/or potential AE needs to increase ease, safety, and independence w/ ADLs. Baseline: reinstated as pt to receive botox and will benefit from additional education with improved function Goal status: IN Progress    LONG TERM GOALS:  Target date: 02/23/23  1.  Pt will demonstrate ability to demonstrate improved functional grasp in RUE to open new jar, increased handwriting, and self-feeding. Baseline:  Goal status: IN Progress  2.  Pt will demonstrate improved UE functional use for ADLs as evidenced by increasing box/ blocks score by 5 blocks with RUE, to be completed in standardized manner. Baseline:  19 blocks with  RUE Goal status: IN Progress  3.  Pt will demonstrate improved shoulder ROM to allow for reach to moderate height as needed for ADLs/IADLs (washing back, putting on jacket, etc).  Baseline:  Goal status: IN Progress  4. Pt will demonstrate improvement of finger extension by 15* TAM of long and ringer finger to allow for more functional hand opening.  Baseline: -36 long finger, -30 ring finger  Goal status: IN Progress  5. Pt will demonstrate improved functional grasp by 5# with RUE to allow for increased independence with cutting foods and opening food containers.  Baseline: R: 6#  Goal status: IN Progress  ASSESSMENT:  CLINICAL IMPRESSION: Pt demonstrating shoulder flexion to ~80* during functional reaching task against gravity.  OT reiterated towel slides to facilitate increased shoulder flexion in gravity eliminated position, with improved results on tabletop.  Reiterated use of table top for bracing when completing finger composite flexion and wrist flexion/extension.  OT applied coban wrapping to R hand to facilitate increased digit flexion.  PERFORMANCE DEFICITS: in functional skills including ADLs, IADLs, coordination, sensation, edema, tone, ROM, strength, pain, flexibility, Fine motor control, Gross motor control, balance, body mechanics, endurance, decreased knowledge of use of DME, skin integrity, vision, and UE functional use, cognitive skills including emotional and safety awareness, and psychosocial skills including coping strategies.   IMPAIRMENTS: are limiting patient from ADLs, IADLs, and rest and sleep.   CO-MORBIDITIES: may have co-morbidities  that affects occupational performance. Patient will benefit from skilled OT to address above impairments and improve overall function.  MODIFICATION OR ASSISTANCE TO COMPLETE EVALUATION: Min-Moderate modification of tasks or assist with assess necessary to complete an evaluation.  OT OCCUPATIONAL PROFILE AND HISTORY: Detailed  assessment: Review of records and additional review of physical, cognitive, psychosocial history related to current functional performance.  CLINICAL DECISION MAKING: Moderate - several treatment options, min-mod task modification necessary  REHAB POTENTIAL: Good  EVALUATION COMPLEXITY: Moderate    PLAN:  OT FREQUENCY: 2x/week  OT DURATION: 6 weeks  PLANNED INTERVENTIONS: self care/ADL training, therapeutic exercise, therapeutic activity, neuromuscular re-education, manual therapy, passive range of motion, balance training, functional mobility training, splinting, paraffin, fluidotherapy, biofeedback, compression bandaging, moist heat, cryotherapy, contrast bath, patient/family education, visual/perceptual remediation/compensation, psychosocial skills training, coping strategies training, and DME and/or AE instructions  RECOMMENDED OTHER SERVICES: NA  CONSULTED AND AGREED WITH PLAN OF CARE: Patient and family member/caregiver  PLAN FOR NEXT SESSION: AAROM or self-ROM to  include shoulder flexion/extension and internal/external rotation, elbow, forearm.  Functional grasp and release.     Rosalio Loud, OTR/L 02/08/2023, 3:54 PM

## 2023-02-13 ENCOUNTER — Ambulatory Visit: Payer: Medicare PPO | Admitting: Occupational Therapy

## 2023-02-13 DIAGNOSIS — M6281 Muscle weakness (generalized): Secondary | ICD-10-CM | POA: Diagnosis not present

## 2023-02-13 DIAGNOSIS — R278 Other lack of coordination: Secondary | ICD-10-CM | POA: Diagnosis not present

## 2023-02-13 DIAGNOSIS — I69354 Hemiplegia and hemiparesis following cerebral infarction affecting left non-dominant side: Secondary | ICD-10-CM | POA: Diagnosis not present

## 2023-02-13 DIAGNOSIS — M79601 Pain in right arm: Secondary | ICD-10-CM

## 2023-02-13 DIAGNOSIS — M25611 Stiffness of right shoulder, not elsewhere classified: Secondary | ICD-10-CM

## 2023-02-13 NOTE — Therapy (Signed)
OUTPATIENT OCCUPATIONAL THERAPY  Treatment Session   Patient Name: Angela Buchanan MRN: [id number] DOB:03-02-1951, 72 y.o., female Today's Date: 02/13/2023  PCP: Shon Hale, MD REFERRING PROVIDER: Shon Hale, MD       END OF SESSION:  OT End of Session - 02/13/23 1411     Visit Number 29    Number of Visits 41    Date for OT Re-Evaluation 02/23/23    Authorization Type [employer]    OT Start Time 1409   arrival time   OT Stop Time 1448    OT Time Calculation (min) 39 min                                     Past Medical History:  Diagnosis Date   Atypical chest pain 07/19/2021   Bigeminy    no current med.   Breast cancer (HCC) 06/2017   right   Bruises easily    CAD in native artery 10/31/2022   Carotid bruit 11/13/2019   Dental crowns present    History of diverticulitis    Hypertension    states under control with med., has been on med. x 5 yr.   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   Past Surgical History:  Procedure Laterality Date   ABDOMINAL HYSTERECTOMY     partial   BREAST LUMPECTOMY Right 06/22/2017   Malignant   BREAST LUMPECTOMY WITH RADIOACTIVE SEED AND SENTINEL LYMPH NODE BIOPSY Right 06/22/2017   Procedure: RIGHT BREAST LUMPECTOMY WITH RADIOACTIVE SEED AND RIGHT AXILLARY DEEP SENTINEL LYMPH NODE BIOPSY WITH BLUE DYE INJECTION;  Surgeon: Claud Kelp, MD;  Location: [facility];  Service: General;  Laterality: Right;   BREAST LUMPECTOMY WITH RADIOACTIVE SEED LOCALIZATION Left 06/22/2017   Procedure: LEFT BREAST LUMPECTOMY WITH RADIOACTIVE SEED LOCALIZATION;  Surgeon: Claud Kelp, MD;  Location: [facility];   Service: General;  Laterality: Left;   CATARACT EXTRACTION W/ INTRAOCULAR LENS  IMPLANT, BILATERAL Bilateral    EXCISION OF BREAST BIOPSY Left 06/22/2017   benign   LOOP RECORDER INSERTION N/A 05/16/2022   Procedure: LOOP RECORDER INSERTION;  Surgeon: Lanier Prude, MD;  Location: [facility];  Service: Cardiovascular;  Laterality: N/A;   PERCUTANEOUS PINNING Right 06/22/2022   Procedure: PERCUTANEOUS PINNING OF RIGHT HIP;  Surgeon: Bjorn Pippin, MD;  Location: [facility];  Service: Orthopedics;  Laterality: Right;   TONSILLECTOMY     age 67   Patient Active Problem List   Diagnosis Date Noted   Right spastic hemiplegia (HCC) 12/14/2022   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 05/11/2020   PVC (premature ventricular contraction)  05/11/2020   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   Carotid bruit 11/13/2019   Pure hypercholesterolemia 11/13/2019   Malignant neoplasm of upper-outer quadrant of right breast in female, estrogen receptor positive (HCC) 05/22/2017   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   Diplopia 09/17/2014   Dizziness and giddiness 09/17/2014   Essential hypertension 07/22/2014   Chest pain 07/22/2014   Family history of heart disease 07/22/2014    ONSET DATE: CVA 05/12/22  REFERRING DIAG: M79.641 (ICD-10-CM) - Pain in right hand  THERAPY DIAG:  Hemiplegia and hemiparesis following cerebral infarction affecting  left non-dominant side (HCC)  Pain in right arm  Stiffness of right shoulder, not elsewhere classified  Other lack of coordination  Muscle weakness (generalized)  Rationale for Evaluation and Treatment: Rehabilitation  SUBJECTIVE:   SUBJECTIVE STATEMENT: Pt reports not wearing her orthosis today. Pt accompanied by: self and significant other (Husband, [name])  PERTINENT HISTORY: hypertension, breast cancer, hyperlipidemia, Lt hip fracture secondary to fall 06/2022, CVA 06/2022   PRECAUTIONS: Fall, Rt breast cancer with node removal, osteoporosis  WEIGHT BEARING RESTRICTIONS: No  PAIN:  Are you having pain? Yes: NPRS scale: 3/10 Pain location: upper arm Pain description: constant Aggravating factors: certain movements Relieving factors: advil  FALLS: Has patient fallen in last 6 months? Yes. Number of falls 1 fall, one week after d/c home from SNF  LIVING ENVIRONMENT: Lives with: lives with their spouse Lives in: House/apartment Stairs: Yes: External: 2 steps; on left going up Has following equipment at home: Dan Humphreys - 2 wheeled, Hemi walker, Wheelchair (manual), shower chair, and Grab bars  PLOF: Independent and Independent with basic ADLs  PATIENT GOALS: to get use of my R arm again  OBJECTIVE:   HAND DOMINANCE: Right  ADLs: Transfers/ambulation related to ADLs: Mod I with hemi-walker Eating: using L hand for eating, husband is assisting with cutting up food Grooming: Mod I, using L hand UB Dressing: Mod assist LB Dressing: Max assist Toileting: Mod I Bathing: Supervision - Mod I Tub Shower transfers: Min assist Equipment: Shower seat with back and Grab bars  UPPER EXTREMITY ROM:    Active ROM Right eval Left eval Right 10/18/22  Right 11/15/22 Right 12/18/22 Right 02/01/23  Shoulder flexion 54   82 86 84  Shoulder abduction        Shoulder adduction        Shoulder extension        Shoulder internal rotation        Shoulder external rotation         Elbow flexion 108   132 136 108  Elbow extension -24   -12 -12 -8  Wrist flexion    38 36 14  Wrist extension    18 24 34  Wrist ulnar deviation        Wrist radial deviation        Wrist pronation        Wrist supination        (Blank rows = not tested)  Passive ROM Right eval Left eval Right 10/18/22 Right 11/15/22 Right 12/18/22 Right 02/01/23  Shoulder flexion 65  76 86 94 ~90  Shoulder abduction        Shoulder adduction        Shoulder extension        Shoulder internal rotation        Shoulder external rotation        Elbow flexion      WNL  Elbow extension -17  -15 -  10 0 WNL  Wrist flexion 46  56 60 60 40  Wrist extension 27  50 40 56 60  Wrist ulnar deviation      30  Wrist radial deviation      12  Wrist pronation        Wrist supination        (Blank rows = not tested)  Passive ROM Right 10/18/22 Right 11/15/22 Right 12/18/22 Right 01/08/23 Right 02/13/23 Active flexion 02/13/23 Passive extension  Thumb MCP (0-60)        Thumb IP (0-80)        Thumb Radial abd/add (0-55)        Thumb Palmar abd/add (0-45)        Thumb Opposition to Small Finger        Index MCP (0-90)   0   70 0  Index PIP (0-100)  -5   75 -4  Index DIP (0-70)   -3   47 -2  Long MCP (0-90)         Long PIP (0-100) -30  -30 -30 -30 82 -23  Long DIP (0-70) -20 -20 -5 -6 52 -4  Ring MCP (0-90)         Ring PIP (0-100) -25  -21 -30 -24 90 -15  Ring DIP (0-70) -15  -8 -7 -6 32 -3  Little MCP (0-90)         Little PIP (0-100)   -18   92 -10  Little DIP (0-70)   -4   41 0  (Blank rows = not tested)   UPPER EXTREMITY MMT:   not assessed due to pain with any PROM, AROM    HAND FUNCTION: Loose gross grasp  R gross grasp: 5#; L gross grasp: 34# 01/08/23: R: 6# gross grasp, lateral pinch 6# (L: 11#), tip to tip R: 2# (L: 8#) 01/30/23: R: 2# gross grasp  COORDINATION: Box and Blocks:  Right 2blocks, Left 45 blocks. Modified to complete with RUE.  Pt able to grasp and move across to other side  of box with barrier removed.   11/01/22: RUE 13 blocks 12/18/22: RUE 19 blocks  SENSATION: Mild decrease in R  EDEMA: mild edema in wrist hand, and fingers on RUE  OBSERVATIONS: Pt guarded with movement, hypersensitive to touch and PROM.     TODAY'S TREATMENT:                                   02/13/23 Towel slides: OT instructing pt in hand over hand to facilitate increased shoulder flexion, incorporating anterior weight shift to facilitate increased ROM. Measurements taken: see above.   Finger extension: improvement of 12* extension in ring finger, 9* improvement in long finger.  Reiterated extension exercises. Finger flexion: measurements taken, reiterated flexion exercises. Retrograde massage: engaged in deep tissue massage with focus on retrograde massage and elevation to facilitate movement of fluid.  OT educated pt and spouse on technique with circular movement and importance of retrograde technique.   UE exercise: reiterated importance of composite flexion of each digit with sustained hold for 10-15 seconds to facilitate increased flexion.  Pt completing x3 with each digit, min cues provided for sustained hold. Attempted translation in palm; pt unable to complete translation of marker palm<> finger tips. OT instructed in thumb abduction and opposition to pinky x5 to carry over to functional use of digits to progress towards in-hand manipulation  tasks as pt demonstrating increased flexion/grasp and extension.    02/08/23 Shoulder ROM/forward reaching: engaged in reaching forward for targets on vertical dowel from seated position to facilitate increased shoulder flexion against gravity.  After towel slides, engaged in shoulder flexion with UE Ranger in standing with UE Ranger positioned in wall anchor.  Pt able to achieve 80-90* shoulder flexion against gravity, OT providing support at hand portion of UE Ranger to stabilize and facilitate ROM. Towel slides: engaged in shoulder flexion with  towel on table top with focus on increased shoulder flexion in gravity eliminated position.  Pt able to facilitate 100-120* flexion at table top. UE ROM: Reviewed wrist flexion/extension with forearm positioned on tabletop to allow for increased ROM, OT demonstrating PROM with pt able to replicate with no reports of pain with either direction.  Reiterated composite finger flexion with pt demonstrating good technique, lacking ~25-30% of movement especially at DIP. Coban wrapping: OT wrapped R digits into fist position with coban.  OT educated on only wearing it 10-15 mins to provide sustained stretch in position but then to remove.  OT encouraged pt to continue with PROM of composite finger flexion with plan to attempt coban wrapping vs alternative splint wear to facilitate increased finger flexion.   02/01/23 Measurements taken, see above. Coordination: flipping large blocks with focus on grasp and release and forearm supination.  Pt utilizing gross grasp but able to flip blocks with partial supination and use of table to assist.  OT then challenged pt to stack blocks with focus on grasp/release and increased shoulder flexion to stack on progressively higher stack.  Pt demonstrating ability to achieve ~80* shoulder flexion against gravity, however utilizing slight trunk lean to L to compensate for decreased shoulder flexion.  Pt then removing stacked blocks with focus on sustained grasp and squeeze to remove blocks from stack.  OT providing intermittent tactile cues at torso to decrease L lean and cues for open of hand when releasing blocks. Folding towels: engaged in folding towels with focus on shoulder flexion and elbow flexion/extension as pt demonstrating increased elbow extension but decreased flexion s/p botox injection.  Pt limited by decreased grip on smaller towel (compared to blocks) but able to complete with gross mobility with RUE.   PATIENT EDUCATION: Education details: ongoing condition  specific education and progressive exercises. Person educated: Patient and Spouse Education method: Explanation, Demonstration, Tactile cues, Verbal cues, and Handouts Education comprehension: verbalized understanding and needs further education  HOME EXERCISE PROGRAM: Access Code: [email or web address] URL: [email or web address] Date: 01/30/2023 Prepared by: [facility] - [facility]  Exercises - Supine Shoulder Flexion AAROM with Dowel  - 1 x daily - 3 x weekly - 2 sets - 10 reps - Supine Shoulder Protraction with Dowel  - 1 x daily - 3 x weekly - 2 sets - 10 reps - Supine Shoulder Abduction AAROM with Dowel  - 1 x daily - 3 x weekly - 2 sets - 10 reps - Touch finger to nose  - 1 x daily - 3 x weekly - 2 sets - 10 reps - 5-10 sec hold - Touch finger to nose  - 1 x daily - 3 x weekly - 2 sets - 10 reps - Closing and Opening Hand With Arm Supported  - 2 x daily - 7 x weekly - 3 sets - 10 reps - Hooklying Single Arm Chest Press  - 1 x daily - 3 x weekly - 2 sets - 10 reps -  Thumb AROM Opposition  - 1 x daily - 7 x weekly - 1 sets - 5 reps - Seated Finger Composite Flexion Stretch  - 1 x daily - 7 x weekly - 1 sets - 5 reps - Hand PROM Finger Extension  - 1 x daily - 7 x weekly - 1 sets - 10 reps - Seated Forearm Pronation Supination AROM  - 1 x daily - 7 x weekly - 1 sets - 10 reps - Seated Wrist Flexion Extension PROM  - 1 x daily - 7 x weekly - 1 sets - 10 reps   GOALS: Goals reviewed with patient? Yes  SHORT TERM GOALS: Target date: 02/02/23  Pt and spouse will be independent with updated ROM and coordination HEP. Baseline: Goal status: Met  2.  Pt will verbalize understanding of task modifications and/or potential AE needs to increase ease, safety, and independence w/ ADLs. Baseline: reinstated as pt to receive botox and will benefit from additional education with improved function Goal status: Met     LONG TERM GOALS:  Target date: 02/23/23  1.  Pt  will demonstrate ability to demonstrate improved functional grasp in RUE to open new jar, increased handwriting, and self-feeding. Baseline:  Goal status: IN Progress  2.  Pt will demonstrate improved UE functional use for ADLs as evidenced by increasing box/ blocks score by 5 blocks with RUE, to be completed in standardized manner. Baseline:  19 blocks with RUE Goal status: IN Progress  3.  Pt will demonstrate improved shoulder ROM to allow for reach to moderate height as needed for ADLs/IADLs (washing back, putting on jacket, etc).  Baseline:  Goal status: IN Progress  4. Pt will demonstrate improvement of finger extension by 15* TAM of long and ringer finger to allow for more functional hand opening.  Baseline: -36 long finger, -30 ring finger  Goal status: IN Progress  5. Pt will demonstrate improved functional grasp by 5# with RUE to allow for increased independence with cutting foods and opening food containers.  Baseline: R: 6#  Goal status: IN Progress  ASSESSMENT:  CLINICAL IMPRESSION: Pt demonstrating improved digit extension with long and ring finger, however continues to demonstrate decreased extension in index finger s/p botox.  Pt with difficulty with attempts at translation for marker from palm to finger tips due to decreased thumb mobility.  OT educated on thumb abduction and opposition to pinky to further facilitate ROM and functional movements.   PERFORMANCE DEFICITS: in functional skills including ADLs, IADLs, coordination, sensation, edema, tone, ROM, strength, pain, flexibility, Fine motor control, Gross motor control, balance, body mechanics, endurance, decreased knowledge of use of DME, skin integrity, vision, and UE functional use, cognitive skills including emotional and safety awareness, and psychosocial skills including coping strategies.   IMPAIRMENTS: are limiting patient from ADLs, IADLs, and rest and sleep.   CO-MORBIDITIES: may have co-morbidities  that  affects occupational performance. Patient will benefit from skilled OT to address above impairments and improve overall function.  MODIFICATION OR ASSISTANCE TO COMPLETE EVALUATION: Min-Moderate modification of tasks or assist with assess necessary to complete an evaluation.  OT OCCUPATIONAL PROFILE AND HISTORY: Detailed assessment: Review of records and additional review of physical, cognitive, psychosocial history related to current functional performance.  CLINICAL DECISION MAKING: Moderate - several treatment options, min-mod task modification necessary  REHAB POTENTIAL: Good  EVALUATION COMPLEXITY: Moderate    PLAN:  OT FREQUENCY: 2x/week  OT DURATION: 6 weeks  PLANNED INTERVENTIONS: self care/ADL training, therapeutic exercise, therapeutic  activity, neuromuscular re-education, manual therapy, passive range of motion, balance training, functional mobility training, splinting, paraffin, fluidotherapy, biofeedback, compression bandaging, moist heat, cryotherapy, contrast bath, patient/family education, visual/perceptual remediation/compensation, psychosocial skills training, coping strategies training, and DME and/or AE instructions  RECOMMENDED OTHER SERVICES: NA  CONSULTED AND AGREED WITH PLAN OF CARE: Patient and family member/caregiver  PLAN FOR NEXT SESSION: AAROM or self-ROM to include shoulder flexion/extension and internal/external rotation, elbow, forearm.  Functional grasp and release.     Rosalio Loud, OTR/L 02/13/2023, 4:04 PM

## 2023-02-14 ENCOUNTER — Ambulatory Visit: Payer: Medicare PPO

## 2023-02-14 DIAGNOSIS — R2681 Unsteadiness on feet: Secondary | ICD-10-CM | POA: Diagnosis not present

## 2023-02-14 DIAGNOSIS — M6281 Muscle weakness (generalized): Secondary | ICD-10-CM | POA: Diagnosis not present

## 2023-02-14 DIAGNOSIS — M25611 Stiffness of right shoulder, not elsewhere classified: Secondary | ICD-10-CM | POA: Diagnosis not present

## 2023-02-14 DIAGNOSIS — M79601 Pain in right arm: Secondary | ICD-10-CM | POA: Diagnosis not present

## 2023-02-14 DIAGNOSIS — R2689 Other abnormalities of gait and mobility: Secondary | ICD-10-CM | POA: Diagnosis not present

## 2023-02-14 DIAGNOSIS — R293 Abnormal posture: Secondary | ICD-10-CM

## 2023-02-14 NOTE — Therapy (Signed)
OUTPATIENT PHYSICAL THERAPY TREATMENT NOTE   Patient Name: Angela Buchanan MRN: [id number] DOB:Apr 07, 1951, 72 y.o., female Today's Date: 02/14/2023  PCP:  Garth Bigness, MD   REFERRING PROVIDER:  Garth Bigness, MD      END OF SESSION:   PT End of Session - 02/14/23 1316     Visit Number 24    Date for PT Re-Evaluation 03/19/23    Authorization Type 36 visit total (PT)    Authorization - Visit Number 3    Authorization - Number of Visits 8    Progress Note Due on Visit 30    PT Start Time 1232    PT Stop Time 1316    PT Time Calculation (min) 44 min    Activity Tolerance Patient tolerated treatment well    Behavior During Therapy [facility] for tasks assessed/performed                                 Past Medical History:  Diagnosis Date   Atypical chest pain 07/19/2021   Bigeminy    no current med.   Breast cancer (HCC) 06/2017   right   Bruises easily    CAD in native artery 10/31/2022   Carotid bruit 11/13/2019   Dental crowns present    History of diverticulitis    Hypertension    states under control with med., has been on med. x 5 yr.   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   Past Surgical History:  Procedure Laterality Date   ABDOMINAL HYSTERECTOMY     partial   BREAST LUMPECTOMY Right 06/22/2017   Malignant   BREAST LUMPECTOMY WITH RADIOACTIVE SEED AND SENTINEL LYMPH NODE BIOPSY Right 06/22/2017   Procedure: RIGHT BREAST LUMPECTOMY WITH RADIOACTIVE SEED AND RIGHT AXILLARY DEEP SENTINEL LYMPH NODE BIOPSY WITH BLUE DYE INJECTION;  Surgeon: Claud Kelp, MD;  Location: [facility];  Service: General;  Laterality: Right;   BREAST LUMPECTOMY WITH RADIOACTIVE SEED  LOCALIZATION Left 06/22/2017   Procedure: LEFT BREAST LUMPECTOMY WITH RADIOACTIVE SEED LOCALIZATION;  Surgeon: Claud Kelp, MD;  Location: [facility];  Service: General;  Laterality: Left;   CATARACT EXTRACTION W/ INTRAOCULAR LENS  IMPLANT, BILATERAL Bilateral    EXCISION OF BREAST BIOPSY Left 06/22/2017   benign   LOOP RECORDER INSERTION N/A 05/16/2022   Procedure: LOOP RECORDER INSERTION;  Surgeon: Lanier Prude, MD;  Location: [facility];  Service: Cardiovascular;  Laterality: N/A;   PERCUTANEOUS PINNING Right 06/22/2022   Procedure: PERCUTANEOUS PINNING OF RIGHT HIP;  Surgeon: Bjorn Pippin, MD;  Location: [facility];  Service: Orthopedics;  Laterality: Right;   TONSILLECTOMY     age 10   Patient Active Problem List   Diagnosis Date Noted   Right spastic hemiplegia (HCC) 12/14/2022   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   CAD in native artery 10/31/2022   Malnutrition of moderate degree 06/22/2022   Closed fracture of femur, neck (HCC) 06/21/2022   Urinary frequency 06/21/2022   Hypokalemia 06/21/2022   Heart murmur 06/21/2022   Hypothyroidism 06/21/2022   Dysphagia 05/17/2022   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   CVA (cerebral vascular accident) (HCC) 05/12/2022   Osteoporosis 01/24/2022  Atypical chest pain 07/19/2021   Temporomandibular joint (TMJ) pain 07/16/2020   Referred otalgia of both ears 07/16/2020   PAC (premature atrial contraction) 05/11/2020   PVC (premature ventricular contraction) 05/11/2020   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   Carotid bruit 11/13/2019   Pure hypercholesterolemia 11/13/2019   Malignant neoplasm of upper-outer quadrant of right breast in female, estrogen receptor positive (HCC) 05/22/2017   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   Diplopia 09/17/2014   Dizziness and giddiness 09/17/2014   Essential hypertension 07/22/2014   Chest pain 07/22/2014   Family history of heart disease 07/22/2014     REFERRING DIAG:  R29.6 (ICD-10-CM) - Repeated falls, closed fracture of Rt hip    THERAPY DIAG:  Muscle weakness (generalized)  Unsteadiness on feet  Other abnormalities of gait and mobility  Abnormal posture  Rationale for Evaluation and Treatment Rehabilitation  PERTINENT HISTORY: hypertension, breast cancer, hyperlipidemia, Lt hip fracture secondary to fall 06/2022, CVA 06/2022   PRECAUTIONS:  Fall, Rt breast cancer with node removal, osteoporosis    SUBJECTIVE:                                                                                                                                                                                      SUBJECTIVE STATEMENT:  I walked a short distance without my cane. I am working on picking up my feet.        PAIN:  Are you having pain? Not right now but my right hand and arm can be very sore at times.    OBJECTIVE: (objective measures completed at initial evaluation unless otherwise dated)  DIAGNOSTIC FINDINGS: Rt hip fracture and ORIF     COGNITION: Overall cognitive status: Within functional limits for tasks assessed                         SENSATION: WFL     POSTURE: rounded shoulders, forward head, flexed trunk , and weight shift left   PALPATION: NA   LOWER EXTREMITY ROM: Rt hip limited by 50%, hamstrings limited by 50% bil.    LOWER EXTREMITY MMT:   MMT Right eval Right  09/27/22 Left eval Right 10/30/22 Right  01/17/23 Left 10/30/22 Left 01/17/23  Hip flexion 3+ 4- 4- 4- 4 4- 4+  Hip extension           Hip abduction 3-  4    4  Hip adduction           Hip internal rotation           Hip external rotation  Knee flexion 3- 4- 3+  4  4+  Knee extension 3+ 4 4- 4 painful hip 4+ 4- 4+  Ankle dorsiflexion 3+ 4- 4- 4-  4   Ankle plantarflexion           Ankle inversion           Ankle eversion            (Blank rows = not tested)   FUNCTIONAL TESTS:   Eval: Timed up and go (TUG): 1 min, 27  seconds   09/27/22: 5x sit to stand: 29 seconds with use of Lt hand  TUG: 27 seconds   10/30/22: 5x sit to stand: 23 sec use of LT hand     TUG: 26 sec with standard cane   11/20/22: 5x sit to stand: 18.68 seconds    TUG: 19.45 without device    3 minute walk test: 225 feet with a standard cane   01/10/23: 5x sit to stand: 15.77 seconds   01/17/23: TUG with cane: 15.41 seconds with cane    3 min walk test: 313 feet with cane  GAIT: Distance walked: 25 feet  Assistive device utilized: Hemi walker on Lt Level of assistance: SBA Comments: slow mobility, reduced trunk rotation, reduce Rt LE stance time and reduced Rt arm swing     TODAY'S TREATMENT:    02/14/23: Nustep L4 x 8 min with PT present to monitor and discuss progress - legs only- instructed pt to keep SPM >40-50 Gait with weight on 1 arm for instability- no device 2x20 feet each hand.  2#- PT present for guarding.  D2 with 2# weight in Lt UE in standing to promote rotation x10 Sit to stand: 2x10 holding 5# kettlebell on Lt LAQ 2.5# Rt 2x10, ER 2.5# added 2x10 Weaving in/out of cones on the floor without device- CGA by PT on gait belt Negotiating hurdles in // bars without UE support   Walking up and down long hallway with gait belt and no device: cueing for symmetry  02/07/23: Nustep L4 x 8 min with PT present to monitor and discuss progress - legs only- instructed pt to keep SPM >40-50 Gait with weight on 1 arm for instability- no device 2x20 feet each hand.  2#- PT present for guarding.  D2 with 2# weight in Lt UE in standing to promote rotation x10 Sit to stand: 2x10 holding 5# kettlebell on Lt LAQ 2.5# Rt 2x10, ER 2.5# added 2x10 Weaving in/out of low cones on the floor without device- CGA by PT on gait belt Negotiating hurdles with step to with gait belt CGA by PT  Standing on balance pad: alternating arm motion to promote trunk rotation and weight shift   01/31/23: Nustep L4 x 8 min with PT present to monitor and  discuss progress - legs only- instructed pt to keep SPM >40-50 Sit to stand: 2x10 holding 5# kettlebell on Lt LAQ 2.5# Rt 2x10, ER 2.5# added 2x10 Tandem stance: Rt and Lt- next to barre 3x15 seconds each standing on balance pad Negotiating hurdles and cobble blocks with gait belt and min UE support by PT.  Step to pattern Standing on balance pad: alternating arm motion to promote trunk rotation and weight shift Gait in // bars with use of mirror for heel strike and symmetry  01/22/23: Nustep L4 x 8 min with PT present to monitor and discuss progress - legs only- instructed pt to keep SPM >35-40 Sit to stand: 2x10 holding 5# kettlebell on  Lt LAQ 2.5# Rt 2x10, ER 2.5# added 2x10 Tandem stance: Rt and Lt- next to barre 3x15 seconds each standing on balance pad Negotiating hurdles in // bars: forward with step-to and lateral with step-to with min UE support Standing on balance pad: alternating arm motion to promote trunk rotation and weight shift Gait with cane- good step symmetry and use of cane- around building  PATIENT EDUCATION:  Education details: Access Code: [email or web address] Person educated: Patient Education method: Explanation, Facilities manager, and Handouts Education comprehension: verbalized understanding and returned demonstration   HOME EXERCISE PROGRAM: Access Code: [email or web address] URL: [email or web address] Date: 09/27/2022 Prepared by: Tresa Endo  Exercises - Seated Long Arc Quad  - 3 x daily - 7 x weekly - 2 sets - 10 reps - 5 hold - Seated March   - 3 x daily - 7 x weekly - 3 sets - 10 reps - Seated Heel Toe Raises   - 3 x daily - 7 x weekly - 2 sets - 10 reps - Seated Isometric Hip Adduction with Ball  - 3 x daily - 7 x weekly - 2 sets - 10 reps - Sit to Stand with Armchair  - 2 x daily - 7 x weekly - 2 sets - 5-10 reps - Standing Hip Abduction with Counter Support  - 1 x daily - 7 x weekly - 2 sets - 10 reps - Heel Raises with Counter Support  - 2 x daily - 7 x weekly - 2  sets - 10 reps  Patient Education - Walking with a Single [employer] - Modified 4 Point Gait Pattern  ASSESSMENT:   CLINICAL IMPRESSION:    Pt has advanced to higher level tasks with additional weight and challenge in the clinic.  Pt did weaving in/out of cones and hurdles without UE support with CGA on gait belt by PT. Pt with improved independence with gait and step length with less cueing.  She negotiated hurdles without UE support today in // bars.   She maintained >45 spm on the NuStep without cueing.  Pt reports improved endurance and confidence with balance tasks at home and in the community.  Patient will benefit from skilled PT to address the below impairments and improve overall function.   OBJECTIVE IMPAIRMENTS: Abnormal gait, decreased activity tolerance, decreased balance, decreased mobility, difficulty walking, decreased strength, decreased safety awareness, impaired perceived functional ability, impaired flexibility, postural dysfunction, and pain.    ACTIVITY LIMITATIONS: carrying, lifting, sitting, standing, stairs, transfers, hygiene/grooming, and locomotion level   PARTICIPATION LIMITATIONS: meal prep, cleaning, laundry, driving, shopping, and community activity   PERSONAL FACTORS: Age, Past/current experiences, and 1-2 comorbidities: CVA, falls, Rt hip fracture with ORIF  are also affecting patient's functional outcome.    REHAB POTENTIAL: Good   CLINICAL DECISION MAKING: Evolving/moderate complexity   EVALUATION COMPLEXITY: Moderate     GOALS: Goals reviewed with patient? Yes   SHORT TERM GOALS: Target date: 09/06/2022   Be independent in initial HEP Baseline: Goal status: Goal met 08/14/22   2.  Improve LE strength to perform sit to stand with moderate Lt UE support Baseline:  Goal status: Goal met 08/28/22  3.  Perform TUG in < or = to 60 seconds to reduce falls risk Baseline: 27 seconds (09/27/22) Goal status: MET      LONG TERM GOALS: Target date:   03/19/23   Be independent in advanced HEP Baseline: independent in current HEP and further progress is needed  Goal status: in progress    2.  Perform TUG in < or = to 13 seconds to reduce falls risk Baseline: 15.41 seconds (01/17/23) Goal status: REVISED   3.  ambulate > or = to 375 feet in 3 minutes to improve community ambulation and independence  Baseline: 313 (01/17/23) Goal status: REVISED    4.  Ambulate with hemi walker with supervision or no guard due to improved gait Baseline: able to do this Goal status: MET   5.  perform 5x sit to stand in < or = to 14 seconds to reduce falls risk  Baseline: 15.77 seconds (01/10/23)  Goal Status: In progress   6. Ambulate with cane for all distances and demonstrate independence due to improved stability  Baseline: using walker   Goal status: MET     PLAN:   PT FREQUENCY: 1x/week   PT DURATION: 8 weeks   PLANNED INTERVENTIONS: Therapeutic exercises, Therapeutic activity, Neuromuscular re-education, Balance training, Gait training, Patient/Family education, Self Care, Joint mobilization, Stair training, Dry Needling, Electrical stimulation, Cryotherapy, Moist heat, Taping, Manual therapy, and Re-evaluation   PLAN FOR NEXT SESSION: Continue to work on gait , strength and balance.   Lorrene Reid, PT 02/14/23 1:18 PM

## 2023-02-15 ENCOUNTER — Ambulatory Visit: Payer: Medicare PPO | Admitting: Occupational Therapy

## 2023-02-15 DIAGNOSIS — I69354 Hemiplegia and hemiparesis following cerebral infarction affecting left non-dominant side: Secondary | ICD-10-CM | POA: Diagnosis not present

## 2023-02-15 DIAGNOSIS — M6281 Muscle weakness (generalized): Secondary | ICD-10-CM | POA: Diagnosis not present

## 2023-02-15 DIAGNOSIS — M79601 Pain in right arm: Secondary | ICD-10-CM | POA: Diagnosis not present

## 2023-02-15 DIAGNOSIS — M25611 Stiffness of right shoulder, not elsewhere classified: Secondary | ICD-10-CM

## 2023-02-15 DIAGNOSIS — R278 Other lack of coordination: Secondary | ICD-10-CM | POA: Diagnosis not present

## 2023-02-15 NOTE — Therapy (Deleted)
[facility] [address] [place], [place], [address] Phone: [phone]   Fax:  [phone]  Patient Details  Name: Angela Buchanan MRN: [id number] Date of Birth: 1951/07/26 Referring Provider:  Shon Hale, *  Encounter Date: 02/15/2023   Rosalio Loud, OT 02/15/2023, 2:54 PM  [facility] [facility] [facility] [address] [place], [place], [address] Phone: [phone]   Fax:  [phone]

## 2023-02-15 NOTE — Therapy (Signed)
OUTPATIENT OCCUPATIONAL THERAPY  Treatment Session & Progress Note  Patient Name: Angela Buchanan MRN: [id number] DOB:[birth year], 72 y.o., female Today's Date: 02/15/2023  PCP: Shon Hale, MD REFERRING PROVIDER: Shon Hale, MD  Occupational Therapy Progress Note  Dates of Reporting Period: 12/21/22 to 02/15/23     END OF SESSION:  OT End of Session - 02/15/23 1454     Visit Number 30    Number of Visits 41    Date for OT Re-Evaluation 02/23/23    Authorization Type [employer]    OT Start Time 1450    OT Stop Time 1530    OT Time Calculation (min) 40 min                                     Past Medical History:  Diagnosis Date   Atypical chest pain 07/19/2021   Bigeminy    no current med.   Breast cancer (HCC) 06/2017   right   Bruises easily    CAD in native artery 10/31/2022   Carotid bruit 11/13/2019   Dental crowns present    History of diverticulitis    Hypertension    states under control with med., has been on med. x 5 yr.   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   Past Surgical History:  Procedure Laterality Date   ABDOMINAL HYSTERECTOMY     partial   BREAST LUMPECTOMY Right 06/22/2017   Malignant   BREAST LUMPECTOMY WITH RADIOACTIVE SEED AND SENTINEL LYMPH NODE BIOPSY Right 06/22/2017   Procedure: RIGHT BREAST LUMPECTOMY WITH RADIOACTIVE SEED AND RIGHT AXILLARY DEEP SENTINEL LYMPH NODE BIOPSY WITH BLUE DYE INJECTION;  Surgeon: Claud Kelp, MD;  Location: [facility];  Service: General;  Laterality: Right;   BREAST LUMPECTOMY WITH RADIOACTIVE SEED LOCALIZATION Left 06/22/2017   Procedure: LEFT BREAST LUMPECTOMY WITH RADIOACTIVE SEED  LOCALIZATION;  Surgeon: Claud Kelp, MD;  Location: [facility];  Service: General;  Laterality: Left;   CATARACT EXTRACTION W/ INTRAOCULAR LENS  IMPLANT, BILATERAL Bilateral    EXCISION OF BREAST BIOPSY Left 06/22/2017   benign   LOOP RECORDER INSERTION N/A 05/16/2022   Procedure: LOOP RECORDER INSERTION;  Surgeon: Lanier Prude, MD;  Location: [facility];  Service: Cardiovascular;  Laterality: N/A;   PERCUTANEOUS PINNING Right 06/22/2022   Procedure: PERCUTANEOUS PINNING OF RIGHT HIP;  Surgeon: Bjorn Pippin, MD;  Location: [facility];  Service: Orthopedics;  Laterality: Right;   TONSILLECTOMY     age 1   Patient Active Problem List   Diagnosis Date Noted   Right spastic hemiplegia (HCC) 12/14/2022   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   CAD in native artery 10/31/2022   Malnutrition of moderate degree 06/22/2022   Closed fracture of femur, neck (HCC) 06/21/2022   Urinary frequency 06/21/2022   Hypokalemia 06/21/2022   Heart murmur 06/21/2022   Hypothyroidism 06/21/2022   Dysphagia 05/17/2022   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   CVA (cerebral vascular accident) (HCC) 05/12/2022   Osteoporosis 01/24/2022   Atypical chest pain 07/19/2021   Temporomandibular joint (TMJ) pain 07/16/2020   Referred otalgia of both ears 07/16/2020   PAC (  premature atrial contraction) 05/11/2020   PVC (premature ventricular contraction) 05/11/2020   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   Carotid bruit 11/13/2019   Pure hypercholesterolemia 11/13/2019   Malignant neoplasm of upper-outer quadrant of right breast in female, estrogen receptor positive (HCC) 05/22/2017   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   Diplopia 09/17/2014   Dizziness and giddiness 09/17/2014   Essential hypertension 07/22/2014   Chest pain 07/22/2014   Family history of heart disease 07/22/2014    ONSET DATE: CVA 05/12/22  REFERRING DIAG: M79.641 (ICD-10-CM) - Pain in right  hand  THERAPY DIAG:  Hemiplegia and hemiparesis following cerebral infarction affecting left non-dominant side (HCC)  Stiffness of right shoulder, not elsewhere classified  Pain in right arm  Other lack of coordination  Rationale for Evaluation and Treatment: Rehabilitation  SUBJECTIVE:   SUBJECTIVE STATEMENT: Pt reports continued attempts at limited ROM in index finger. Pt accompanied by: self and significant other (Husband, [name])  PERTINENT HISTORY: hypertension, breast cancer, hyperlipidemia, Lt hip fracture secondary to fall 06/2022, CVA 06/2022   PRECAUTIONS: Fall, Rt breast cancer with node removal, osteoporosis  WEIGHT BEARING RESTRICTIONS: No  PAIN:  Are you having pain? Yes: NPRS scale: 3/10 Pain location: upper arm Pain description: constant Aggravating factors: certain movements Relieving factors: advil  FALLS: Has patient fallen in last 6 months? Yes. Number of falls 1 fall, one week after d/c home from SNF  LIVING ENVIRONMENT: Lives with: lives with their spouse Lives in: House/apartment Stairs: Yes: External: 2 steps; on left going up Has following equipment at home: Dan Humphreys - 2 wheeled, Hemi walker, Wheelchair (manual), shower chair, and Grab bars  PLOF: Independent and Independent with basic ADLs  PATIENT GOALS: to get use of my R arm again  OBJECTIVE:   HAND DOMINANCE: Right  ADLs: Transfers/ambulation related to ADLs: Mod I with hemi-walker Eating: using L hand for eating, husband is assisting with cutting up food Grooming: Mod I, using L hand UB Dressing: Mod assist LB Dressing: Max assist Toileting: Mod I Bathing: Supervision - Mod I Tub Shower transfers: Min assist Equipment: Shower seat with back and Grab bars  UPPER EXTREMITY ROM:    Active ROM Right eval Left eval Right 10/18/22  Right 11/15/22 Right 12/18/22 Right 02/01/23  Shoulder flexion 54   82 86 84  Shoulder abduction        Shoulder adduction        Shoulder extension         Shoulder internal rotation        Shoulder external rotation        Elbow flexion 108   132 136 108  Elbow extension -24   -12 -12 -8  Wrist flexion    38 36 14  Wrist extension    18 24 34  Wrist ulnar deviation        Wrist radial deviation        Wrist pronation        Wrist supination        (Blank rows = not tested)  Passive ROM Right eval Left eval Right 10/18/22 Right 11/15/22 Right 12/18/22 Right 02/01/23  Shoulder flexion 65  76 86 94 ~90  Shoulder abduction        Shoulder adduction        Shoulder extension        Shoulder internal rotation        Shoulder external rotation        Elbow flexion  WNL  Elbow extension -17  -15 -10 0 WNL  Wrist flexion 46  56 60 60 40  Wrist extension 27  50 40 56 60  Wrist ulnar deviation      30  Wrist radial deviation      12  Wrist pronation        Wrist supination        (Blank rows = not tested)  Passive ROM Right 10/18/22 Right 11/15/22 Right 12/18/22 Right 01/08/23 Right 02/13/23 Active flexion 02/13/23 Passive extension  Thumb MCP (0-60)        Thumb IP (0-80)        Thumb Radial abd/add (0-55)        Thumb Palmar abd/add (0-45)        Thumb Opposition to Small Finger        Index MCP (0-90)   0   70 0  Index PIP (0-100)  -5   75 -4  Index DIP (0-70)   -3   47 -2  Long MCP (0-90)         Long PIP (0-100) -30  -30 -30 -30 82 -23  Long DIP (0-70) -20 -20 -5 -6 52 -4  Ring MCP (0-90)         Ring PIP (0-100) -25  -21 -30 -24 90 -15  Ring DIP (0-70) -15  -8 -7 -6 32 -3  Little MCP (0-90)         Little PIP (0-100)   -18   92 -10  Little DIP (0-70)   -4   41 0  (Blank rows = not tested)   UPPER EXTREMITY MMT:   not assessed due to pain with any PROM, AROM    HAND FUNCTION: Loose gross grasp  R gross grasp: 5#; L gross grasp: 34# 01/08/23: R: 6# gross grasp, lateral pinch 6# (L: 11#), tip to tip R: 2# (L: 8#) 01/30/23: R: 2# gross grasp 02/15/23: R: 4.5# gross grasp  COORDINATION: Box and Blocks:  Right  2blocks, Left 45 blocks. Modified to complete with RUE.  Pt able to grasp and move across to other side of box with barrier removed.   11/01/22: RUE 13 blocks 12/18/22: RUE 19 blocks 02/15/23: RUE 13 blocks  SENSATION: Mild decrease in R  EDEMA: mild edema in wrist hand, and fingers on RUE  OBSERVATIONS: Pt guarded with movement, hypersensitive to touch and PROM.     TODAY'S TREATMENT:                                   02/15/23 Box and Blocks: R: 13 blocks, attempted second time with removal of barrier to facilitate increased ease due to decreased shoulder flexion, however pt with decreased results due to fatigue. Shoulder ROM in supine: engaged in shoulder abduction, shoulder flexion, and punching motion.  Pt tolerating 90* shoulder flexion with attempts at overhead reach and increased attempts with punching.  Pt demonstrating decreased motor control with punching, therefore engaged in place and hold with OT providing perturbations but pt unable to maintain position against pressure.  Utilized dowel in supine with palms facing in to further facilitate increased motor control during chest press and increased shoulder flexion.  Pt achieving 117* shoulder flexion in supine with bimanual use of dowel.    02/13/23 Towel slides: OT instructing pt in hand over hand to facilitate increased shoulder flexion, incorporating anterior weight shift to facilitate  increased ROM. Measurements taken: see above.   Finger extension: improvement of 12* extension in ring finger, 9* improvement in long finger.  Reiterated extension exercises. Finger flexion: measurements taken, reiterated flexion exercises. Retrograde massage: engaged in deep tissue massage with focus on retrograde massage and elevation to facilitate movement of fluid.  OT educated pt and spouse on technique with circular movement and importance of retrograde technique.   UE exercise: reiterated importance of composite flexion of each digit with  sustained hold for 10-15 seconds to facilitate increased flexion.  Pt completing x3 with each digit, min cues provided for sustained hold. Attempted translation in palm; pt unable to complete translation of marker palm<> finger tips. OT instructed in thumb abduction and opposition to pinky x5 to carry over to functional use of digits to progress towards in-hand manipulation tasks as pt demonstrating increased flexion/grasp and extension.     02/08/23 Shoulder ROM/forward reaching: engaged in reaching forward for targets on vertical dowel from seated position to facilitate increased shoulder flexion against gravity.  After towel slides, engaged in shoulder flexion with UE Ranger in standing with UE Ranger positioned in wall anchor.  Pt able to achieve 80-90* shoulder flexion against gravity, OT providing support at hand portion of UE Ranger to stabilize and facilitate ROM. Towel slides: engaged in shoulder flexion with towel on table top with focus on increased shoulder flexion in gravity eliminated position.  Pt able to facilitate 100-120* flexion at table top. UE ROM: Reviewed wrist flexion/extension with forearm positioned on tabletop to allow for increased ROM, OT demonstrating PROM with pt able to replicate with no reports of pain with either direction.  Reiterated composite finger flexion with pt demonstrating good technique, lacking ~25-30% of movement especially at DIP. Coban wrapping: OT wrapped R digits into fist position with coban.  OT educated on only wearing it 10-15 mins to provide sustained stretch in position but then to remove.  OT encouraged pt to continue with PROM of composite finger flexion with plan to attempt coban wrapping vs alternative splint wear to facilitate increased finger flexion.    PATIENT EDUCATION: Education details: ongoing condition specific education and progressive exercises. Person educated: Patient and Spouse Education method: Explanation, Demonstration, Tactile  cues, Verbal cues, and Handouts Education comprehension: verbalized understanding and needs further education  HOME EXERCISE PROGRAM: Access Code: [email or web address] URL: [email or web address] Date: 01/30/2023 Prepared by: [facility] - Outpatient  Rehab - [facility]  Exercises - Supine Shoulder Flexion AAROM with Dowel  - 1 x daily - 3 x weekly - 2 sets - 10 reps - Supine Shoulder Protraction with Dowel  - 1 x daily - 3 x weekly - 2 sets - 10 reps - Supine Shoulder Abduction AAROM with Dowel  - 1 x daily - 3 x weekly - 2 sets - 10 reps - Touch finger to nose  - 1 x daily - 3 x weekly - 2 sets - 10 reps - 5-10 sec hold - Touch finger to nose  - 1 x daily - 3 x weekly - 2 sets - 10 reps - Closing and Opening Hand With Arm Supported  - 2 x daily - 7 x weekly - 3 sets - 10 reps - Hooklying Single Arm Chest Press  - 1 x daily - 3 x weekly - 2 sets - 10 reps - Thumb AROM Opposition  - 1 x daily - 7 x weekly - 1 sets - 5 reps - Seated Finger Composite Flexion Stretch  - 1 x  daily - 7 x weekly - 1 sets - 5 reps - Hand PROM Finger Extension  - 1 x daily - 7 x weekly - 1 sets - 10 reps - Seated Forearm Pronation Supination AROM  - 1 x daily - 7 x weekly - 1 sets - 10 reps - Seated Wrist Flexion Extension PROM  - 1 x daily - 7 x weekly - 1 sets - 10 reps   GOALS: Goals reviewed with patient? Yes  SHORT TERM GOALS: Target date: 02/02/23  Pt and spouse will be independent with updated ROM and coordination HEP. Baseline: Goal status: Met  2.  Pt will verbalize understanding of task modifications and/or potential AE needs to increase ease, safety, and independence w/ ADLs. Baseline: reinstated as pt to receive botox and will benefit from additional education with improved function Goal status: Met     LONG TERM GOALS:  Target date: 02/23/23  1.  Pt will demonstrate ability to demonstrate improved functional grasp in RUE to open new jar, increased handwriting, and  self-feeding. Baseline:  Goal status: IN Progress  2.  Pt will demonstrate improved UE functional use for ADLs as evidenced by increasing box/ blocks score by 5 blocks with RUE, to be completed in standardized manner. Baseline:  19 blocks with RUE Goal status: IN Progress  3.  Pt will demonstrate improved shoulder ROM to allow for reach to moderate height as needed for ADLs/IADLs (washing back, putting on jacket, etc).  Baseline:  Goal status: IN Progress  4. Pt will demonstrate improvement of finger extension by 15* TAM of long and ringer finger to allow for more functional hand opening.  Baseline: -36 long finger, -30 ring finger  Goal status: IN Progress  5. Pt will demonstrate improved functional grasp by 5# with RUE to allow for increased independence with cutting foods and opening food containers.  Baseline: R: 6#  Goal status: IN Progress  ASSESSMENT:  CLINICAL IMPRESSION: Pt was making good progress towards goals, with current goals established prior to botox injections.  S/p botox injections pt with increase in pain in RUE and decrease in function, however decrease in spasticity.  Pt is beginning to regain the function that she lost s/p botox injection.  Pt has regained some functional grasp and grip strength but not truly functional.  Pt demonstrating increased finger extension with bracing and stretches and has begun to regain some finger flexion with exception of index finger.  Pt is limited in shoulder flexion and endurance with attempts at functional reaching tasks. Pt will continue to benefit from OT to address ROM, strength, and coordination of RUE to decrease burden of care and increase ease with ADLs and functional use of dominant RUE.  PERFORMANCE DEFICITS: in functional skills including ADLs, IADLs, coordination, sensation, edema, tone, ROM, strength, pain, flexibility, Fine motor control, Gross motor control, balance, body mechanics, endurance, decreased knowledge of use  of DME, skin integrity, vision, and UE functional use, cognitive skills including emotional and safety awareness, and psychosocial skills including coping strategies.   IMPAIRMENTS: are limiting patient from ADLs, IADLs, and rest and sleep.   CO-MORBIDITIES: may have co-morbidities  that affects occupational performance. Patient will benefit from skilled OT to address above impairments and improve overall function.  MODIFICATION OR ASSISTANCE TO COMPLETE EVALUATION: Min-Moderate modification of tasks or assist with assess necessary to complete an evaluation.  OT OCCUPATIONAL PROFILE AND HISTORY: Detailed assessment: Review of records and additional review of physical, cognitive, psychosocial history related to current functional  performance.  CLINICAL DECISION MAKING: Moderate - several treatment options, min-mod task modification necessary  REHAB POTENTIAL: Good  EVALUATION COMPLEXITY: Moderate    PLAN:  OT FREQUENCY: 2x/week  OT DURATION: 6 weeks  PLANNED INTERVENTIONS: self care/ADL training, therapeutic exercise, therapeutic activity, neuromuscular re-education, manual therapy, passive range of motion, balance training, functional mobility training, splinting, paraffin, fluidotherapy, biofeedback, compression bandaging, moist heat, cryotherapy, contrast bath, patient/family education, visual/perceptual remediation/compensation, psychosocial skills training, coping strategies training, and DME and/or AE instructions  RECOMMENDED OTHER SERVICES: NA  CONSULTED AND AGREED WITH PLAN OF CARE: Patient and family member/caregiver  PLAN FOR NEXT SESSION: AAROM or self-ROM to include shoulder flexion/extension and internal/external rotation, elbow, forearm.  Functional grasp and release.     Rosalio Loud, OTR/L 02/15/2023, 2:54 PM

## 2023-02-20 ENCOUNTER — Other Ambulatory Visit (HOSPITAL_BASED_OUTPATIENT_CLINIC_OR_DEPARTMENT_OTHER): Payer: Self-pay | Admitting: Family

## 2023-02-20 ENCOUNTER — Ambulatory Visit: Payer: Medicare PPO | Admitting: Occupational Therapy

## 2023-02-20 DIAGNOSIS — I25118 Atherosclerotic heart disease of native coronary artery with other forms of angina pectoris: Secondary | ICD-10-CM

## 2023-02-20 DIAGNOSIS — I69354 Hemiplegia and hemiparesis following cerebral infarction affecting left non-dominant side: Secondary | ICD-10-CM | POA: Diagnosis not present

## 2023-02-20 DIAGNOSIS — R278 Other lack of coordination: Secondary | ICD-10-CM | POA: Diagnosis not present

## 2023-02-20 DIAGNOSIS — M6281 Muscle weakness (generalized): Secondary | ICD-10-CM | POA: Diagnosis not present

## 2023-02-20 DIAGNOSIS — M25611 Stiffness of right shoulder, not elsewhere classified: Secondary | ICD-10-CM | POA: Diagnosis not present

## 2023-02-20 DIAGNOSIS — M79601 Pain in right arm: Secondary | ICD-10-CM

## 2023-02-20 NOTE — Telephone Encounter (Signed)
Patient no longer on this medication, refill denied

## 2023-02-20 NOTE — Progress Notes (Signed)
[facility] Summary Report / Loop Recorder 

## 2023-02-20 NOTE — Therapy (Addendum)
OUTPATIENT OCCUPATIONAL THERAPY  Treatment Session   Patient Name: Angela Buchanan MRN: [id number] DOB:August 24, 1951, 72 y.o., female Today's Date: 02/20/2023  PCP: Shon Hale, MD REFERRING PROVIDER: Shon Hale, MD     END OF SESSION:  OT End of Session - 02/20/23 1606     Visit Number 31    Number of Visits 41    Date for OT Re-Evaluation 02/23/23    Authorization Type [employer]    OT Start Time 1404    OT Stop Time 1448    OT Time Calculation (min) 44 min                                      Past Medical History:  Diagnosis Date   Atypical chest pain 07/19/2021   Bigeminy    no current med.   Breast cancer (HCC) 06/2017   right   Bruises easily    CAD in native artery 10/31/2022   Carotid bruit 11/13/2019   Dental crowns present    History of diverticulitis    Hypertension    states under control with med., has been on med. x 5 yr.   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   Past Surgical History:  Procedure Laterality Date   ABDOMINAL HYSTERECTOMY     partial   BREAST LUMPECTOMY Right 06/22/2017   Malignant   BREAST LUMPECTOMY WITH RADIOACTIVE SEED AND SENTINEL LYMPH NODE BIOPSY Right 06/22/2017   Procedure: RIGHT BREAST LUMPECTOMY WITH RADIOACTIVE SEED AND RIGHT AXILLARY DEEP SENTINEL LYMPH NODE BIOPSY WITH BLUE DYE INJECTION;  Surgeon: Claud Kelp, MD;  Location: [facility];  Service: General;  Laterality: Right;   BREAST LUMPECTOMY WITH RADIOACTIVE SEED LOCALIZATION Left 06/22/2017   Procedure: LEFT BREAST LUMPECTOMY WITH RADIOACTIVE SEED LOCALIZATION;  Surgeon: Claud Kelp, MD;  Location: [facility];  Service: General;   Laterality: Left;   CATARACT EXTRACTION W/ INTRAOCULAR LENS  IMPLANT, BILATERAL Bilateral    EXCISION OF BREAST BIOPSY Left 06/22/2017   benign   LOOP RECORDER INSERTION N/A 05/16/2022   Procedure: LOOP RECORDER INSERTION;  Surgeon: Lanier Prude, MD;  Location: [facility];  Service: Cardiovascular;  Laterality: N/A;   PERCUTANEOUS PINNING Right 06/22/2022   Procedure: PERCUTANEOUS PINNING OF RIGHT HIP;  Surgeon: Bjorn Pippin, MD;  Location: [facility];  Service: Orthopedics;  Laterality: Right;   TONSILLECTOMY     age 36   Patient Active Problem List   Diagnosis Date Noted   Right spastic hemiplegia (HCC) 12/14/2022   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 05/11/2020   PVC (premature ventricular contraction) 05/11/2020   Ventricular  bigeminy 11/13/2019   Carotid bruit 11/13/2019   Pure hypercholesterolemia 11/13/2019   Malignant neoplasm of upper-outer quadrant of right breast in female, estrogen receptor positive (HCC) 05/22/2017   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   Diplopia 09/17/2014   Dizziness and giddiness 09/17/2014   Essential hypertension 07/22/2014   Chest pain 07/22/2014   Family history of heart disease 07/22/2014    ONSET DATE: CVA 05/12/22  REFERRING DIAG: M79.641 (ICD-10-CM) - Pain in right hand  THERAPY DIAG:  Hemiplegia and hemiparesis following cerebral infarction affecting left non-dominant  side (HCC)  Stiffness of right shoulder, not elsewhere classified  Pain in right arm  Other lack of coordination  Muscle weakness (generalized)  Rationale for Evaluation and Treatment: Rehabilitation  SUBJECTIVE:   SUBJECTIVE STATEMENT: "I am sore at night after doing my exercises." Pt accompanied by: self and significant other (Husband, [name])  PERTINENT HISTORY: hypertension, breast cancer, hyperlipidemia, Lt hip fracture secondary to fall 06/2022, CVA 06/2022   PRECAUTIONS: Fall, Rt breast cancer with node removal, osteoporosis  WEIGHT BEARING RESTRICTIONS: No  PAIN:  Are you having pain? Yes: NPRS scale: 3/10 Pain location: upper arm Pain description: constant Aggravating factors: certain movements Relieving factors: advil  FALLS: Has patient fallen in last 6 months? Yes. Number of falls 1 fall, one week after d/c home from SNF  LIVING ENVIRONMENT: Lives with: lives with their spouse Lives in: House/apartment Stairs: Yes: External: 2 steps; on left going up Has following equipment at home: Dan Humphreys - 2 wheeled, Hemi walker, Wheelchair (manual), shower chair, and Grab bars  PLOF: Independent and Independent with basic ADLs  PATIENT GOALS: to get use of my R arm again  OBJECTIVE:   HAND DOMINANCE: Right  ADLs: Transfers/ambulation related to ADLs: Mod I with hemi-walker Eating: using L hand for eating, husband is assisting with cutting up food Grooming: Mod I, using L hand UB Dressing: Mod assist LB Dressing: Max assist Toileting: Mod I Bathing: Supervision - Mod I Tub Shower transfers: Min assist Equipment: Shower seat with back and Grab bars  UPPER EXTREMITY ROM:    Active ROM Right eval Left eval Right 10/18/22  Right 11/15/22 Right 12/18/22 Right 02/01/23  Shoulder flexion 54   82 86 84  Shoulder abduction        Shoulder adduction        Shoulder extension        Shoulder internal rotation        Shoulder external rotation        Elbow flexion  108   132 136 108  Elbow extension -24   -12 -12 -8  Wrist flexion    38 36 14  Wrist extension    18 24 34  Wrist ulnar deviation        Wrist radial deviation        Wrist pronation        Wrist supination        (Blank rows = not tested)  Passive ROM Right eval Left eval Right 10/18/22 Right 11/15/22 Right 12/18/22 Right 02/01/23  Shoulder flexion 65  76 86 94 ~90  Shoulder abduction        Shoulder adduction        Shoulder extension        Shoulder internal rotation        Shoulder external rotation        Elbow flexion      WNL  Elbow extension -17  -15 -10 0  WNL  Wrist flexion 46  56 60 60 40  Wrist extension 27  50 40 56 60  Wrist ulnar deviation      30  Wrist radial deviation      12  Wrist pronation        Wrist supination        (Blank rows = not tested)  Passive ROM Right 10/18/22 Right 11/15/22 Right 12/18/22 Right 01/08/23 Right 02/13/23 Active flexion 02/13/23 Passive extension  Thumb MCP (0-60)        Thumb IP (0-80)        Thumb Radial abd/add (0-55)        Thumb Palmar abd/add (0-45)        Thumb Opposition to Small Finger        Index MCP (0-90)   0   70 0  Index PIP (0-100)  -5   75 -4  Index DIP (0-70)   -3   47 -2  Long MCP (0-90)         Long PIP (0-100) -30  -30 -30 -30 82 -23  Long DIP (0-70) -20 -20 -5 -6 52 -4  Ring MCP (0-90)         Ring PIP (0-100) -25  -21 -30 -24 90 -15  Ring DIP (0-70) -15  -8 -7 -6 32 -3  Little MCP (0-90)         Little PIP (0-100)   -18   92 -10  Little DIP (0-70)   -4   41 0  (Blank rows = not tested)   UPPER EXTREMITY MMT:   not assessed due to pain with any PROM, AROM    HAND FUNCTION: Loose gross grasp  R gross grasp: 5#; L gross grasp: 34# 01/08/23: R: 6# gross grasp, lateral pinch 6# (L: 11#), tip to tip R: 2# (L: 8#) 01/30/23: R: 2# gross grasp 02/15/23: R: 4.5# gross grasp  COORDINATION: Box and Blocks:  Right 2blocks, Left 45 blocks. Modified to complete with RUE.  Pt able to grasp and move across  to other side of box with barrier removed.   11/01/22: RUE 13 blocks 12/18/22: RUE 19 blocks 02/15/23: RUE 13 blocks  SENSATION: Mild decrease in R  EDEMA: mild edema in wrist hand, and fingers on RUE  OBSERVATIONS: Pt guarded with movement, hypersensitive to touch and PROM.     TODAY'S TREATMENT:                                   02/20/23 UE ROM:  engaged in supination/pronation with use of spatula to further facilitate supination movement as needed to "flip pancakes".  Attempted more functional avenue this session to facilitate increased ROM.  Pt utilizing compensatory trunk and shoulder movements to increase success.  OT providing tactile cues at trunk and shoulder and providing verbal cues to keep elbow in by side to allow for increased isolated supination.  Transitioned to use of rings on rainbow arc with focus on pinch and supination when transferring rings from one side of ring to other.  Pt requiring increased time, effort, and min assist to obtain loose grasp on rings but able to maintain grasp to translate across arc.   Internal/external rotation with use of BUE on hula hoop to facilitate shoulder flexion and internal/external rotation.  Pt demonstrating improved movement with use of BUE together on hula hoop.    02/15/23 Box and  Blocks: R: 13 blocks, attempted second time with removal of barrier to facilitate increased ease due to decreased shoulder flexion, however pt with decreased results due to fatigue. Shoulder ROM in supine: engaged in shoulder abduction, shoulder flexion, and punching motion.  Pt tolerating 90* shoulder flexion with attempts at overhead reach and increased attempts with punching.  Pt demonstrating decreased motor control with punching, therefore engaged in place and hold with OT providing perturbations but pt unable to maintain position against pressure.  Utilized dowel in supine with palms facing in to further facilitate increased motor control during chest press  and increased shoulder flexion.  Pt achieving 117* shoulder flexion in supine with bimanual use of dowel.    02/13/23 Towel slides: OT instructing pt in hand over hand to facilitate increased shoulder flexion, incorporating anterior weight shift to facilitate increased ROM. Measurements taken: see above.   Finger extension: improvement of 12* extension in ring finger, 9* improvement in long finger.  Reiterated extension exercises. Finger flexion: measurements taken, reiterated flexion exercises. Retrograde massage: engaged in deep tissue massage with focus on retrograde massage and elevation to facilitate movement of fluid.  OT educated pt and spouse on technique with circular movement and importance of retrograde technique.   UE exercise: reiterated importance of composite flexion of each digit with sustained hold for 10-15 seconds to facilitate increased flexion.  Pt completing x3 with each digit, min cues provided for sustained hold. Attempted translation in palm; pt unable to complete translation of marker palm<> finger tips. OT instructed in thumb abduction and opposition to pinky x5 to carry over to functional use of digits to progress towards in-hand manipulation tasks as pt demonstrating increased flexion/grasp and extension.     PATIENT EDUCATION: Education details: ongoing condition specific education and progressive exercises. Person educated: Patient and Spouse Education method: Explanation, Demonstration, Tactile cues, Verbal cues, and Handouts Education comprehension: verbalized understanding and needs further education  HOME EXERCISE PROGRAM: Access Code: [email or web address] URL: [email or web address] Date: 01/30/2023 Prepared by: [facility] - Outpatient  Rehab - [facility]  Exercises - Supine Shoulder Flexion AAROM with Dowel  - 1 x daily - 3 x weekly - 2 sets - 10 reps - Supine Shoulder Protraction with Dowel  - 1 x daily - 3 x weekly - 2 sets - 10 reps - Supine  Shoulder Abduction AAROM with Dowel  - 1 x daily - 3 x weekly - 2 sets - 10 reps - Touch finger to nose  - 1 x daily - 3 x weekly - 2 sets - 10 reps - 5-10 sec hold - Touch finger to nose  - 1 x daily - 3 x weekly - 2 sets - 10 reps - Closing and Opening Hand With Arm Supported  - 2 x daily - 7 x weekly - 3 sets - 10 reps - Hooklying Single Arm Chest Press  - 1 x daily - 3 x weekly - 2 sets - 10 reps - Thumb AROM Opposition  - 1 x daily - 7 x weekly - 1 sets - 5 reps - Seated Finger Composite Flexion Stretch  - 1 x daily - 7 x weekly - 1 sets - 5 reps - Hand PROM Finger Extension  - 1 x daily - 7 x weekly - 1 sets - 10 reps - Seated Forearm Pronation Supination AROM  - 1 x daily - 7 x weekly - 1 sets - 10 reps - Seated Wrist Flexion Extension PROM  - 1 x daily -  7 x weekly - 1 sets - 10 reps   GOALS: Goals reviewed with patient? Yes  SHORT TERM GOALS: Target date: 02/02/23  Pt and spouse will be independent with updated ROM and coordination HEP. Baseline: Goal status: Met  2.  Pt will verbalize understanding of task modifications and/or potential AE needs to increase ease, safety, and independence w/ ADLs. Baseline: reinstated as pt to receive botox and will benefit from additional education with improved function Goal status: Met     LONG TERM GOALS:  Target date: 02/23/23  1.  Pt will demonstrate ability to demonstrate improved functional grasp in RUE to open new jar, increased handwriting, and self-feeding. Baseline:  Goal status: IN Progress  2.  Pt will demonstrate improved UE functional use for ADLs as evidenced by increasing box/ blocks score by 5 blocks with RUE, to be completed in standardized manner. Baseline:  19 blocks with RUE (13 blocks on 02/15/23) Goal status: IN Progress  3.  Pt will demonstrate improved shoulder ROM to allow for reach to moderate height as needed for ADLs/IADLs (washing back, putting on jacket, etc).  Baseline:  Goal status: IN Progress  4. Pt  will demonstrate improvement of finger extension by 15* TAM of long and ringer finger to allow for more functional hand opening.  Baseline: -36 long finger, -30 ring finger (-27 and -19 on 02/13/23)  Goal status: IN Progress  5. Pt will demonstrate improved functional grasp by 5# with RUE to allow for increased independence with cutting foods and opening food containers.  Baseline: R: 6# (4.5# on 02/15/23)  Goal status: IN Progress  ASSESSMENT:  CLINICAL IMPRESSION: Pt has been seen for OT services to address increased hand opening and functional use of RUE and hand s/p CVA.  Tx has incorporated NMR, use of kinesiotape, progressive splinting for finger extension, and botox injections for spasticity.  Pt had been making good progress towards goals, with current goals established prior to botox injections.  S/p botox injections pt with increase in pain in RUE and decrease in function, however decrease in spasticity. Pt has regained some function but due to lingering botox with continued decrease in function, plan to place pt on hold to have f/u with PM&R about next steps and/or other alternatives and then f/u with OT based on next steps and continued wearing off of botox.   OT discussed with pt and spouse to defer?for ~30 days to determine continued necessity of skilled OT services. Pt encouraged to call back before that time with any changes or if any relevant functional deficits develop/occur. Pt to be discharged from OP OT if no further therapy is warranted by 03/23/23.  PERFORMANCE DEFICITS: in functional skills including ADLs, IADLs, coordination, sensation, edema, tone, ROM, strength, pain, flexibility, Fine motor control, Gross motor control, balance, body mechanics, endurance, decreased knowledge of use of DME, skin integrity, vision, and UE functional use, cognitive skills including emotional and safety awareness, and psychosocial skills including coping strategies.   IMPAIRMENTS: are limiting  patient from ADLs, IADLs, and rest and sleep.   CO-MORBIDITIES: may have co-morbidities  that affects occupational performance. Patient will benefit from skilled OT to address above impairments and improve overall function.  MODIFICATION OR ASSISTANCE TO COMPLETE EVALUATION: Min-Moderate modification of tasks or assist with assess necessary to complete an evaluation.  OT OCCUPATIONAL PROFILE AND HISTORY: Detailed assessment: Review of records and additional review of physical, cognitive, psychosocial history related to current functional performance.  CLINICAL DECISION MAKING: Moderate - several  treatment options, min-mod task modification necessary  REHAB POTENTIAL: Good  EVALUATION COMPLEXITY: Moderate    PLAN:  OT FREQUENCY: 2x/week  OT DURATION: 6 weeks  PLANNED INTERVENTIONS: self care/ADL training, therapeutic exercise, therapeutic activity, neuromuscular re-education, manual therapy, passive range of motion, balance training, functional mobility training, splinting, paraffin, fluidotherapy, biofeedback, compression bandaging, moist heat, cryotherapy, contrast bath, patient/family education, visual/perceptual remediation/compensation, psychosocial skills training, coping strategies training, and DME and/or AE instructions  RECOMMENDED OTHER SERVICES: NA  CONSULTED AND AGREED WITH PLAN OF CARE: Patient and family member/caregiver  PLAN FOR NEXT SESSION: Re-eval vs d/c    Joanthan Hlavacek, OTR/L 02/20/2023, 4:07 PM   OCCUPATIONAL THERAPY DISCHARGE SUMMARY  Visits from Start of Care: 31  Current functional level related to goals / functional outcomes: Unsure as pt has not returned for therapy.  Plan was to hold off for further f/u with MD and PM&R and then to resume therapy as warranted. Due to being >30 days, pt will benefit from new referral and evaluation for return to therapy.   Remaining deficits: Decreased ROM, strength, coordination in dominant RUE   Education /  Equipment: ROM, coordination HEP, education on adaptive techniques/equipment   Patient agrees to discharge. Patient goals were not met. Patient is being discharged due to not returning since the last visit.Marland Kitchen  Rosalio Loud, OTR/L 04/12/23

## 2023-02-21 ENCOUNTER — Ambulatory Visit: Payer: Medicare PPO

## 2023-02-21 DIAGNOSIS — R293 Abnormal posture: Secondary | ICD-10-CM | POA: Diagnosis not present

## 2023-02-21 DIAGNOSIS — M79601 Pain in right arm: Secondary | ICD-10-CM

## 2023-02-21 DIAGNOSIS — R2681 Unsteadiness on feet: Secondary | ICD-10-CM

## 2023-02-21 DIAGNOSIS — M25611 Stiffness of right shoulder, not elsewhere classified: Secondary | ICD-10-CM

## 2023-02-21 DIAGNOSIS — R2689 Other abnormalities of gait and mobility: Secondary | ICD-10-CM

## 2023-02-21 DIAGNOSIS — M6281 Muscle weakness (generalized): Secondary | ICD-10-CM

## 2023-02-21 NOTE — Therapy (Signed)
OUTPATIENT PHYSICAL THERAPY TREATMENT NOTE   Patient Name: Angela Buchanan MRN: [id number] DOB:Aug 31, 1951, 72 y.o., female Today's Date: 02/21/2023  PCP:  Garth Bigness, MD   REFERRING PROVIDER:  Garth Bigness, MD      END OF SESSION:   PT End of Session - 02/21/23 1545     Visit Number 25    Date for PT Re-Evaluation 03/19/23    Authorization Type 36 visit total (PT)    Authorization - Visit Number 4    Authorization - Number of Visits 8    Progress Note Due on Visit 30    PT Start Time 1451    PT Stop Time 1539    PT Time Calculation (min) 48 min    Activity Tolerance Patient tolerated treatment well    Behavior During Therapy [facility] for tasks assessed/performed                                  Past Medical History:  Diagnosis Date   Atypical chest pain 07/19/2021   Bigeminy    no current med.   Breast cancer (HCC) 06/2017   right   Bruises easily    CAD in native artery 10/31/2022   Carotid bruit 11/13/2019   Dental crowns present    History of diverticulitis    Hypertension    states under control with med., has been on med. x 5 yr.   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   Past Surgical History:  Procedure Laterality Date   ABDOMINAL HYSTERECTOMY     partial   BREAST LUMPECTOMY Right 06/22/2017   Malignant   BREAST LUMPECTOMY WITH RADIOACTIVE SEED AND SENTINEL LYMPH NODE BIOPSY Right 06/22/2017   Procedure: RIGHT BREAST LUMPECTOMY WITH RADIOACTIVE SEED AND RIGHT AXILLARY DEEP SENTINEL LYMPH NODE BIOPSY WITH BLUE DYE INJECTION;  Surgeon: Claud Kelp, MD;  Location: [facility];  Service: General;  Laterality: Right;   BREAST LUMPECTOMY WITH RADIOACTIVE SEED  LOCALIZATION Left 06/22/2017   Procedure: LEFT BREAST LUMPECTOMY WITH RADIOACTIVE SEED LOCALIZATION;  Surgeon: Claud Kelp, MD;  Location: [facility];  Service: General;  Laterality: Left;   CATARACT EXTRACTION W/ INTRAOCULAR LENS  IMPLANT, BILATERAL Bilateral    EXCISION OF BREAST BIOPSY Left 06/22/2017   benign   LOOP RECORDER INSERTION N/A 05/16/2022   Procedure: LOOP RECORDER INSERTION;  Surgeon: Lanier Prude, MD;  Location: [facility];  Service: Cardiovascular;  Laterality: N/A;   PERCUTANEOUS PINNING Right 06/22/2022   Procedure: PERCUTANEOUS PINNING OF RIGHT HIP;  Surgeon: Bjorn Pippin, MD;  Location: [facility];  Service: Orthopedics;  Laterality: Right;   TONSILLECTOMY     age 66   Patient Active Problem List   Diagnosis Date Noted   Right spastic hemiplegia (HCC) 12/14/2022   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   CAD in native artery 10/31/2022   Malnutrition of moderate degree 06/22/2022   Closed fracture of femur, neck (HCC) 06/21/2022   Urinary frequency 06/21/2022   Hypokalemia 06/21/2022   Heart murmur 06/21/2022   Hypothyroidism 06/21/2022   Dysphagia 05/17/2022   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   CVA (cerebral vascular accident) (HCC) 05/12/2022   Osteoporosis  01/24/2022   Atypical chest pain 07/19/2021   Temporomandibular joint (TMJ) pain 07/16/2020   Referred otalgia of both ears 07/16/2020   PAC (premature atrial contraction) 05/11/2020   PVC (premature ventricular contraction) 05/11/2020   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   Carotid bruit 11/13/2019   Pure hypercholesterolemia 11/13/2019   Malignant neoplasm of upper-outer quadrant of right breast in female, estrogen receptor positive (HCC) 05/22/2017   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   Diplopia 09/17/2014   Dizziness and giddiness 09/17/2014   Essential hypertension 07/22/2014   Chest pain 07/22/2014   Family history of heart disease 07/22/2014     REFERRING DIAG:  R29.6 (ICD-10-CM) - Repeated falls, closed fracture of Rt hip    THERAPY DIAG:  Stiffness of right shoulder, not elsewhere classified  Pain in right arm  Muscle weakness (generalized)  Unsteadiness on feet  Other abnormalities of gait and mobility  Abnormal posture  Rationale for Evaluation and Treatment Rehabilitation  PERTINENT HISTORY: hypertension, breast cancer, hyperlipidemia, Lt hip fracture secondary to fall 06/2022, CVA 06/2022   PRECAUTIONS:  Fall, Rt breast cancer with node removal, osteoporosis    SUBJECTIVE:                                                                                                                                                                                      SUBJECTIVE STATEMENT:  I see the doctor about my arm tomorrow.       PAIN:  Are you having pain? Not right now but my right hand and arm can be very sore at times.    OBJECTIVE: (objective measures completed at initial evaluation unless otherwise dated)  DIAGNOSTIC FINDINGS: Rt hip fracture and ORIF     COGNITION: Overall cognitive status: Within functional limits for tasks assessed                         SENSATION: WFL     POSTURE: rounded shoulders, forward head, flexed trunk , and weight shift left   PALPATION: NA   LOWER EXTREMITY ROM: Rt hip limited by 50%, hamstrings limited by 50% bil.    LOWER EXTREMITY MMT:   MMT Right eval Right  09/27/22 Left eval Right 10/30/22 Right  01/17/23 Left 10/30/22 Left 01/17/23  Hip flexion 3+ 4- 4- 4- 4 4- 4+  Hip extension           Hip abduction 3-  4    4  Hip adduction           Hip internal rotation           Hip external rotation  Knee flexion 3- 4- 3+  4  4+  Knee extension 3+ 4 4- 4 painful hip 4+ 4- 4+  Ankle dorsiflexion 3+ 4- 4- 4-  4   Ankle plantarflexion           Ankle inversion           Ankle eversion            (Blank rows = not tested)   FUNCTIONAL TESTS:    Eval: Timed up and go (TUG): 1 min, 27 seconds   09/27/22: 5x sit to stand: 29 seconds with use of Lt hand  TUG: 27 seconds   10/30/22: 5x sit to stand: 23 sec use of LT hand     TUG: 26 sec with standard cane   11/20/22: 5x sit to stand: 18.68 seconds    TUG: 19.45 without device    3 minute walk test: 225 feet with a standard cane   01/10/23: 5x sit to stand: 15.77 seconds   01/17/23: TUG with cane: 15.41 seconds with cane    3 min walk test: 313 feet with cane  GAIT: Distance walked: 25 feet  Assistive device utilized: Hemi walker on Lt Level of assistance: SBA Comments: slow mobility, reduced trunk rotation, reduce Rt LE stance time and reduced Rt arm swing     TODAY'S TREATMENT:    02/21/23: Nustep L4 x 8 min with PT present to monitor and discuss progress - legs only- instructed pt to keep SPM >40-50 Leg press: seat 5, 45#  bil legs 2x10, Lt only 35#, Rt only 25# 2x10 bil each Gait with weight on 1 arm for instability on Lt up and down hallway with 1 seated rest break.  5#- PT present for guarding.  D2 with 2# weight in Lt UE in standing to promote rotation x10 Sit to stand: 2x10 holding 5# kettlebell on Lt Weaving in/out of cones on the floor without device- CGA by PT on gait belt Negotiating hurdles in // bars without UE support     02/14/23: Nustep L4 x 8 min with PT present to monitor and discuss progress - legs only- instructed pt to keep SPM >40-50 Gait with weight on 1 arm for instability- no device 2x20 feet each hand.  2#- PT present for guarding.  D2 with 2# weight in Lt UE in standing to promote rotation x10 Sit to stand: 2x10 holding 5# kettlebell on Lt LAQ 2.5# Rt 2x10, ER 2.5# added 2x10 Weaving in/out of cones on the floor without device- CGA by PT on gait belt Negotiating hurdles in // bars without UE support   Walking up and down long hallway with gait belt and no device: cueing for symmetry  02/07/23: Nustep L4 x 8 min with PT present to monitor and  discuss progress - legs only- instructed pt to keep SPM >40-50 Gait with weight on 1 arm for instability- no device 2x20 feet each hand.  2#- PT present for guarding.  D2 with 2# weight in Lt UE in standing to promote rotation x10 Sit to stand: 2x10 holding 5# kettlebell on Lt LAQ 2.5# Rt 2x10, ER 2.5# added 2x10 Weaving in/out of low cones on the floor without device- CGA by PT on gait belt Negotiating hurdles with step to with gait belt CGA by PT  Standing on balance pad: alternating arm motion to promote trunk rotation and weight shift  PATIENT EDUCATION:  Education details: Access Code: [email or web address] Person educated: Patient Education method: Explanation, Demonstration, and Handouts  Education comprehension: verbalized understanding and returned demonstration   HOME EXERCISE PROGRAM: Access Code: [email or web address] URL: [email or web address] Date: 09/27/2022 Prepared by: Tresa Endo  Exercises - Seated Long Arc Quad  - 3 x daily - 7 x weekly - 2 sets - 10 reps - 5 hold - Seated March   - 3 x daily - 7 x weekly - 3 sets - 10 reps - Seated Heel Toe Raises   - 3 x daily - 7 x weekly - 2 sets - 10 reps - Seated Isometric Hip Adduction with Ball  - 3 x daily - 7 x weekly - 2 sets - 10 reps - Sit to Stand with Armchair  - 2 x daily - 7 x weekly - 2 sets - 5-10 reps - Standing Hip Abduction with Counter Support  - 1 x daily - 7 x weekly - 2 sets - 10 reps - Heel Raises with Counter Support  - 2 x daily - 7 x weekly - 2 sets - 10 reps  Patient Education - Walking with a Single [employer] - Modified 4 Point Gait Pattern  ASSESSMENT:   CLINICAL IMPRESSION:    Pt has advanced to higher level tasks with additional weight and challenge in the clinic.  Pt did weaving in/out of cones and hurdles without UE support with CGA on gait belt by PT. She did well with leg press today and PT provided verbal cues for alignment and speed. Pt performed 5# sit to standing with 5# kettle bell with ease. Pt reports  improved endurance and confidence with balance tasks at home and in the community.  Patient will benefit from skilled PT to address the below impairments and improve overall function.   OBJECTIVE IMPAIRMENTS: Abnormal gait, decreased activity tolerance, decreased balance, decreased mobility, difficulty walking, decreased strength, decreased safety awareness, impaired perceived functional ability, impaired flexibility, postural dysfunction, and pain.    ACTIVITY LIMITATIONS: carrying, lifting, sitting, standing, stairs, transfers, hygiene/grooming, and locomotion level   PARTICIPATION LIMITATIONS: meal prep, cleaning, laundry, driving, shopping, and community activity   PERSONAL FACTORS: Age, Past/current experiences, and 1-2 comorbidities: CVA, falls, Rt hip fracture with ORIF  are also affecting patient's functional outcome.    REHAB POTENTIAL: Good   CLINICAL DECISION MAKING: Evolving/moderate complexity   EVALUATION COMPLEXITY: Moderate     GOALS: Goals reviewed with patient? Yes   SHORT TERM GOALS: Target date: 09/06/2022   Be independent in initial HEP Baseline: Goal status: Goal met 08/14/22   2.  Improve LE strength to perform sit to stand with moderate Lt UE support Baseline:  Goal status: Goal met 08/28/22  3.  Perform TUG in < or = to 60 seconds to reduce falls risk Baseline: 27 seconds (09/27/22) Goal status: MET      LONG TERM GOALS: Target date:  03/19/23   Be independent in advanced HEP Baseline: independent in current HEP and further progress is needed  Goal status: in progress    2.  Perform TUG in < or = to 13 seconds to reduce falls risk Baseline: 15.41 seconds (01/17/23) Goal status: REVISED   3.  ambulate > or = to 375 feet in 3 minutes to improve community ambulation and independence  Baseline: 313 (01/17/23) Goal status: REVISED    4.  Ambulate with hemi walker with supervision or no guard due to improved gait Baseline: able to do this Goal  status: MET   5.  perform 5x sit to stand in < or = to 14 seconds to  reduce falls risk  Baseline: 15.77 seconds (01/10/23)  Goal Status: In progress   6. Ambulate with cane for all distances and demonstrate independence due to improved stability  Baseline: using walker   Goal status: MET     PLAN:   PT FREQUENCY: 1x/week   PT DURATION: 8 weeks   PLANNED INTERVENTIONS: Therapeutic exercises, Therapeutic activity, Neuromuscular re-education, Balance training, Gait training, Patient/Family education, Self Care, Joint mobilization, Stair training, Dry Needling, Electrical stimulation, Cryotherapy, Moist heat, Taping, Manual therapy, and Re-evaluation   PLAN FOR NEXT SESSION: Continue to work on gait , strength and balance.   Lorrene Reid, PT 02/21/23 3:47 PM

## 2023-02-22 ENCOUNTER — Encounter: Payer: Medicare PPO | Attending: Physical Medicine & Rehabilitation | Admitting: Physical Medicine & Rehabilitation

## 2023-02-22 ENCOUNTER — Encounter: Payer: Self-pay | Admitting: Physical Medicine & Rehabilitation

## 2023-02-22 VITALS — BP 126/59 | HR 61 | Ht 62.0 in | Wt 101.0 lb

## 2023-02-22 DIAGNOSIS — G8111 Spastic hemiplegia affecting right dominant side: Secondary | ICD-10-CM | POA: Diagnosis not present

## 2023-02-22 DIAGNOSIS — M7501 Adhesive capsulitis of right shoulder: Secondary | ICD-10-CM

## 2023-02-22 NOTE — Progress Notes (Signed)
  72 y.o. female with a history of hypertension, HLD, atypical CP, ventricular bigeminy/PAC/PVC, right breast cancer and carotid bruit, who presented to [facility] with acute right sided weakness and difficulty speaking. She stated that she was writing and suddenly was unable to use her right hand to write and then with difficulty speaking and  right-sided weakness and numbness with difficulty walking which resulted in the decision to be seen in the ED where a Code Stroke was called. NIHSS 4. tNK was administered. Cleviprex was initiated for BP 180/87. CTA showed  No large vessel occlusion, hemodynamically significant stenosis, or evidence of dissection. CTH negative for acute finding. Moderate stenosis left P1-P2 PCA junctio. The patient was then sent to [facility] for post-TNK management and stroke work up.   Date of Admission: 05/12/2022 Date of Discharge: 05/17/2022  The patient follows up today with no new complaints.  She states that for about a day or 2 after the Botox injections she felt like she had right hand swelling.  She denies any forearm pain.  Review of OT notes reveals that patient did have a decline in grip strength and we discussed that this is normal if finger flexors are injected.  We also discussed the usual duration of Botox and that she is now 6 weeks post and will likely start noting some increased finger flexor strength in about 2 to 3 weeks. Her right shoulder pain has improved  Exam General no acute distress Mood and affect appropriate Right upper extremity tone MAS 1 at the elbow flexor MAS 1 at the finger flexors and wrist flexors and thumb flexors. Musculoskeletal right shoulder negative impingement sign there is pain with external rotation at around 25 degrees Motor strength 3 - at the right deltoid to minus at the finger flexors and extensors  Impression 1.  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  Her tone is markedly improved after botulinum toxin injection.  She did  experience some loss of finger flexor function and we discussed that fine-tuning of the botulinum toxin injection should help with this.  Her finger flexor strength should start with returning in around 2 weeks we also discussed that in future botulinum toxin injections would be at a lower dose.  Test at the hand swelling that she had experienced postinjection unusual and may have been an issue with positioning more so than with the medication itself.  Her swelling was not at any of the injection sites.  Plan:  Botox RUE 100U FCR 25U FDS 25U Biceps 50U

## 2023-02-27 ENCOUNTER — Ambulatory Visit (INDEPENDENT_AMBULATORY_CARE_PROVIDER_SITE_OTHER): Payer: Medicare PPO

## 2023-02-27 DIAGNOSIS — Z8673 Personal history of transient ischemic attack (TIA), and cerebral infarction without residual deficits: Secondary | ICD-10-CM | POA: Diagnosis not present

## 2023-02-27 DIAGNOSIS — I639 Cerebral infarction, unspecified: Secondary | ICD-10-CM | POA: Diagnosis not present

## 2023-02-27 LAB — CUP PACEART REMOTE DEVICE CHECK
Date Time Interrogation Session: 20240528060400
Implantable Pulse Generator Implant Date: 20230815
Pulse Gen Serial Number: 183424

## 2023-02-28 ENCOUNTER — Ambulatory Visit: Payer: Medicare PPO

## 2023-02-28 DIAGNOSIS — M25611 Stiffness of right shoulder, not elsewhere classified: Secondary | ICD-10-CM | POA: Diagnosis not present

## 2023-02-28 DIAGNOSIS — M79601 Pain in right arm: Secondary | ICD-10-CM | POA: Diagnosis not present

## 2023-02-28 DIAGNOSIS — R293 Abnormal posture: Secondary | ICD-10-CM | POA: Diagnosis not present

## 2023-02-28 DIAGNOSIS — R2681 Unsteadiness on feet: Secondary | ICD-10-CM

## 2023-02-28 DIAGNOSIS — M6281 Muscle weakness (generalized): Secondary | ICD-10-CM

## 2023-02-28 DIAGNOSIS — R2689 Other abnormalities of gait and mobility: Secondary | ICD-10-CM | POA: Diagnosis not present

## 2023-02-28 NOTE — Therapy (Signed)
OUTPATIENT PHYSICAL THERAPY TREATMENT NOTE   Patient Name: Angela Buchanan MRN: [id number] DOB:November 16, 1950, 72 y.o., female Today's Date: 02/28/2023  PCP:  Garth Bigness, MD   REFERRING PROVIDER:  Garth Bigness, MD      END OF SESSION:   PT End of Session - 02/28/23 1528     Visit Number 26    Date for PT Re-Evaluation 03/19/23    Authorization Type 36 visit total (PT)    Authorization Time Period 8 visits 4/20-6/17/24    Authorization - Visit Number 5    Authorization - Number of Visits 8    Progress Note Due on Visit 30    PT Start Time 1447    PT Stop Time 1529    PT Time Calculation (min) 42 min    Activity Tolerance Patient tolerated treatment well    Behavior During Therapy [facility] for tasks assessed/performed                                   Past Medical History:  Diagnosis Date   Atypical chest pain 07/19/2021   Bigeminy    no current med.   Breast cancer (HCC) 06/2017   right   Bruises easily    CAD in native artery 10/31/2022   Carotid bruit 11/13/2019   Dental crowns present    History of diverticulitis    Hypertension    states under control with med., has been on med. x 5 yr.   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   Past Surgical History:  Procedure Laterality Date   ABDOMINAL HYSTERECTOMY     partial   BREAST LUMPECTOMY Right 06/22/2017   Malignant   BREAST LUMPECTOMY WITH RADIOACTIVE SEED AND SENTINEL LYMPH NODE BIOPSY Right 06/22/2017   Procedure: RIGHT BREAST LUMPECTOMY WITH RADIOACTIVE SEED AND RIGHT AXILLARY DEEP SENTINEL LYMPH NODE BIOPSY WITH BLUE DYE INJECTION;  Surgeon: Claud Kelp, MD;  Location: [facility];  Service: General;   Laterality: Right;   BREAST LUMPECTOMY WITH RADIOACTIVE SEED LOCALIZATION Left 06/22/2017   Procedure: LEFT BREAST LUMPECTOMY WITH RADIOACTIVE SEED LOCALIZATION;  Surgeon: Claud Kelp, MD;  Location: [facility];  Service: General;  Laterality: Left;   CATARACT EXTRACTION W/ INTRAOCULAR LENS  IMPLANT, BILATERAL Bilateral    EXCISION OF BREAST BIOPSY Left 06/22/2017   benign   LOOP RECORDER INSERTION N/A 05/16/2022   Procedure: LOOP RECORDER INSERTION;  Surgeon: Lanier Prude, MD;  Location: [facility];  Service: Cardiovascular;  Laterality: N/A;   PERCUTANEOUS PINNING Right 06/22/2022   Procedure: PERCUTANEOUS PINNING OF RIGHT HIP;  Surgeon: Bjorn Pippin, MD;  Location: [facility];  Service: Orthopedics;  Laterality: Right;   TONSILLECTOMY     age 67   Patient Active Problem List   Diagnosis Date Noted   Right spastic hemiplegia (HCC) 12/14/2022   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   CAD in native artery 10/31/2022   Malnutrition of moderate degree 06/22/2022   Closed fracture of femur, neck (HCC) 06/21/2022   Urinary frequency 06/21/2022   Hypokalemia 06/21/2022   Heart murmur 06/21/2022   Hypothyroidism 06/21/2022   Dysphagia 05/17/2022   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  CVA (cerebral vascular accident) (HCC) 05/12/2022   Osteoporosis 01/24/2022   Atypical chest pain 07/19/2021   Temporomandibular joint (TMJ) pain 07/16/2020   Referred otalgia of both ears 07/16/2020   PAC (premature atrial contraction) 05/11/2020   PVC (premature ventricular contraction) 05/11/2020   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   Carotid bruit 11/13/2019   Pure hypercholesterolemia 11/13/2019   Malignant neoplasm of upper-outer quadrant of right breast in female, estrogen receptor positive (HCC) 05/22/2017   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   Diplopia 09/17/2014   Dizziness and giddiness 09/17/2014   Essential hypertension 07/22/2014   Chest pain  07/22/2014   Family history of heart disease 07/22/2014    REFERRING DIAG:  R29.6 (ICD-10-CM) - Repeated falls, closed fracture of Rt hip    THERAPY DIAG:  Muscle weakness (generalized)  Unsteadiness on feet  Abnormal posture  Rationale for Evaluation and Treatment Rehabilitation  PERTINENT HISTORY: hypertension, breast cancer, hyperlipidemia, Lt hip fracture secondary to fall 06/2022, CVA 06/2022   PRECAUTIONS:  Fall, Rt breast cancer with node removal, osteoporosis    SUBJECTIVE:                                                                                                                                                                                      SUBJECTIVE STATEMENT:  I walked with my dog to the end of the driveway and did well.   I saw the MD about my arm.  He said it is normal to lose grip strength after Botox and this will improve after 6 weeks from injection.      PAIN:  Are you having pain? Not right now but my right hand and arm can be very sore at times.    OBJECTIVE: (objective measures completed at initial evaluation unless otherwise dated)  DIAGNOSTIC FINDINGS: Rt hip fracture and ORIF     COGNITION: Overall cognitive status: Within functional limits for tasks assessed                         SENSATION: WFL  POSTURE: rounded shoulders, forward head, flexed trunk , and weight shift left   PALPATION: NA   LOWER EXTREMITY ROM: Rt hip limited by 50%, hamstrings limited by 50% bil.    LOWER EXTREMITY MMT:   MMT Right eval Right  09/27/22 Left eval Right 10/30/22 Right  01/17/23 Left 10/30/22 Left 01/17/23  Hip flexion 3+ 4- 4- 4- 4 4- 4+  Hip extension           Hip abduction 3-  4    4  Hip adduction  Hip internal rotation           Hip external rotation           Knee flexion 3- 4- 3+  4  4+  Knee extension 3+ 4 4- 4 painful hip 4+ 4- 4+  Ankle dorsiflexion 3+ 4- 4- 4-  4   Ankle plantarflexion           Ankle inversion            Ankle eversion            (Blank rows = not tested)   FUNCTIONAL TESTS:   Eval: Timed up and go (TUG): 1 min, 27 seconds   09/27/22: 5x sit to stand: 29 seconds with use of Lt hand  TUG: 27 seconds   10/30/22: 5x sit to stand: 23 sec use of LT hand     TUG: 26 sec with standard cane   11/20/22: 5x sit to stand: 18.68 seconds    TUG: 19.45 without device    3 minute walk test: 225 feet with a standard cane   01/10/23: 5x sit to stand: 15.77 seconds   01/17/23: TUG with cane: 15.41 seconds with cane    3 min walk test: 313 feet with cane  GAIT: Distance walked: 25 feet  Assistive device utilized: Hemi walker on Lt Level of assistance: SBA Comments: slow mobility, reduced trunk rotation, reduce Rt LE stance time and reduced Rt arm swing     TODAY'S TREATMENT:   02/28/23: Nustep L4 x 8 min with PT present to monitor and discuss progress - legs only- instructed pt to keep SPM >40-50 Leg press: seat 5, 45#  bil legs 2x10, Lt only 30#, Rt only 20# 2x10 bil each Gait around building with standard cane: CGA on gait belt by PT.  Full lap around clinic    D2 with 2# weight in Lt UE in standing to promote rotation x10 Sit to stand: 2x10 holding 5# kettlebell on Lt Obstacle course: stepping over hurdles, on 2" step and balance pad- CGA on gait belt with 1 loss of balance     02/21/23: Nustep L4 x 8 min with PT present to monitor and discuss progress - legs only- instructed pt to keep SPM >40-50 Leg press: seat 5, 45#  bil legs 2x10, Lt only 35#, Rt only 25# 2x10 bil each Gait with weight on 1 arm for instability on Lt up and down hallway with 1 seated rest break.  5#- PT present for guarding.  D2 with 2# weight in Lt UE in standing to promote rotation x10 Sit to stand: 2x10 holding 5# kettlebell on Lt Weaving in/out of cones on the floor without device- CGA by PT on gait belt Negotiating hurdles in // bars without UE support    02/14/23: Nustep L4 x 8 min with PT present to monitor  and discuss progress - legs only- instructed pt to keep SPM >40-50 Gait with weight on 1 arm for instability- no device 2x20 feet each hand.  2#- PT present for guarding.  D2 with 2# weight in Lt UE in standing to promote rotation x10 Sit to stand: 2x10 holding 5# kettlebell on Lt LAQ 2.5# Rt 2x10, ER 2.5# added 2x10 Weaving in/out of cones on the floor without device- CGA by PT on gait belt Negotiating hurdles in // bars without UE support   Walking up and down long hallway with gait belt and no device: cueing for symmetry   PATIENT EDUCATION:  Education details: Access Code: [email or web address] Person educated: Patient Education method: Explanation, Demonstration, and Handouts Education comprehension: verbalized understanding and returned demonstration   HOME EXERCISE PROGRAM: Access Code: [email or web address] URL: [email or web address] Date: 09/27/2022 Prepared by: Tresa Endo  Exercises - Seated Long Arc Quad  - 3 x daily - 7 x weekly - 2 sets - 10 reps - 5 hold - Seated March   - 3 x daily - 7 x weekly - 3 sets - 10 reps - Seated Heel Toe Raises   - 3 x daily - 7 x weekly - 2 sets - 10 reps - Seated Isometric Hip Adduction with Ball  - 3 x daily - 7 x weekly - 2 sets - 10 reps - Sit to Stand with Armchair  - 2 x daily - 7 x weekly - 2 sets - 5-10 reps - Standing Hip Abduction with Counter Support  - 1 x daily - 7 x weekly - 2 sets - 10 reps - Heel Raises with Counter Support  - 2 x daily - 7 x weekly - 2 sets - 10 reps  Patient Education - Walking with a Single [employer] - Modified 4 Point Gait Pattern  ASSESSMENT:   CLINICAL IMPRESSION:    Pt continues to advance strength and endurance.  She was able to walk with her dog to the end of the driveway and back.  She did well with leg press today and PT provided verbal cues for alignment and speed. Pt performed sit to stand with 5# kettle bell with ease. Pt reports improved endurance and confidence with balance tasks at home and in the  community. She walked a full loop in the clinic with standard cane and PT advised on a walking stick with stable bottom on [employer].   Patient will benefit from skilled PT to address the below impairments and improve overall function.   OBJECTIVE IMPAIRMENTS: Abnormal gait, decreased activity tolerance, decreased balance, decreased mobility, difficulty walking, decreased strength, decreased safety awareness, impaired perceived functional ability, impaired flexibility, postural dysfunction, and pain.    ACTIVITY LIMITATIONS: carrying, lifting, sitting, standing, stairs, transfers, hygiene/grooming, and locomotion level   PARTICIPATION LIMITATIONS: meal prep, cleaning, laundry, driving, shopping, and community activity   PERSONAL FACTORS: Age, Past/current experiences, and 1-2 comorbidities: CVA, falls, Rt hip fracture with ORIF  are also affecting patient's functional outcome.    REHAB POTENTIAL: Good   CLINICAL DECISION MAKING: Evolving/moderate complexity   EVALUATION COMPLEXITY: Moderate     GOALS: Goals reviewed with patient? Yes   SHORT TERM GOALS: Target date: 09/06/2022   Be independent in initial HEP Baseline: Goal status: Goal met 08/14/22   2.  Improve LE strength to perform sit to stand with moderate Lt UE support Baseline:  Goal status: Goal met 08/28/22  3.  Perform TUG in < or = to 60 seconds to reduce falls risk Baseline: 27 seconds (09/27/22) Goal status: MET      LONG TERM GOALS: Target date:  03/19/23   Be independent in advanced HEP Baseline: independent in current HEP and further progress is needed  Goal status: in progress    2.  Perform TUG in < or = to 13 seconds to reduce falls risk Baseline: 15.41 seconds (01/17/23) Goal status: REVISED   3.  ambulate > or = to 375 feet in 3 minutes to improve community ambulation and independence  Baseline: 313 (01/17/23) Goal status: REVISED    4.  Ambulate with hemi walker with supervision or no guard due to  improved gait Baseline: able to do this Goal status: MET   5.  perform 5x sit to stand in < or = to 14 seconds to reduce falls risk  Baseline: 15.77 seconds (01/10/23)  Goal Status: In progress   6. Ambulate with cane for all distances and demonstrate independence due to improved stability  Baseline: using walker   Goal status: MET     PLAN:   PT FREQUENCY: 1x/week   PT DURATION: 8 weeks   PLANNED INTERVENTIONS: Therapeutic exercises, Therapeutic activity, Neuromuscular re-education, Balance training, Gait training, Patient/Family education, Self Care, Joint mobilization, Stair training, Dry Needling, Electrical stimulation, Cryotherapy, Moist heat, Taping, Manual therapy, and Re-evaluation   PLAN FOR NEXT SESSION: Continue to work on gait , strength and balance.   Lorrene Reid, PT 02/28/23 3:28 PM

## 2023-03-05 ENCOUNTER — Telehealth (HOSPITAL_BASED_OUTPATIENT_CLINIC_OR_DEPARTMENT_OTHER): Payer: Self-pay

## 2023-03-05 NOTE — Telephone Encounter (Addendum)
Called patient, reminded her of labs within the next few weeks.   ----- Message from Marlene Lard, RN sent at 01/29/2023  3:09 PM EDT ----- Call lab reminder

## 2023-03-07 ENCOUNTER — Ambulatory Visit: Payer: Medicare PPO | Attending: Family Medicine

## 2023-03-07 ENCOUNTER — Telehealth: Payer: Self-pay | Admitting: Cardiovascular Disease

## 2023-03-07 DIAGNOSIS — R293 Abnormal posture: Secondary | ICD-10-CM | POA: Diagnosis not present

## 2023-03-07 DIAGNOSIS — M6281 Muscle weakness (generalized): Secondary | ICD-10-CM | POA: Diagnosis not present

## 2023-03-07 DIAGNOSIS — R2681 Unsteadiness on feet: Secondary | ICD-10-CM | POA: Insufficient documentation

## 2023-03-07 DIAGNOSIS — E7800 Pure hypercholesterolemia, unspecified: Secondary | ICD-10-CM | POA: Diagnosis not present

## 2023-03-07 MED ORDER — METOPROLOL TARTRATE 25 MG PO TABS: 25.0000 mg | ORAL_TABLET | Freq: Every evening | ORAL | 1 refills | Status: DC

## 2023-03-07 NOTE — Telephone Encounter (Signed)
Rx request sent to pharmacy.  

## 2023-03-07 NOTE — Therapy (Signed)
OUTPATIENT PHYSICAL THERAPY TREATMENT NOTE   Patient Name: Angela Buchanan MRN: [id number] DOB:02/05/1951, 72 y.o., female Today's Date: 03/07/2023  PCP:  Garth Bigness, MD   REFERRING PROVIDER:  Garth Bigness, MD      END OF SESSION:   PT End of Session - 03/07/23 1527     Visit Number 27    Date for PT Re-Evaluation 03/19/23    Authorization Type 36 visit total (PT)    Authorization Time Period 8 visits 4/20-6/17/24    Authorization - Visit Number 6    Authorization - Number of Visits 8    Progress Note Due on Visit 30    PT Start Time 1446    PT Stop Time 1529    PT Time Calculation (min) 43 min    Activity Tolerance Patient tolerated treatment well    Behavior During Therapy [facility] for tasks assessed/performed                                    Past Medical History:  Diagnosis Date   Atypical chest pain 07/19/2021   Bigeminy    no current med.   Breast cancer (HCC) 06/2017   right   Bruises easily    CAD in native artery 10/31/2022   Carotid bruit 11/13/2019   Dental crowns present    History of diverticulitis    Hypertension    states under control with med., has been on med. x 5 yr.   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   Past Surgical History:  Procedure Laterality Date   ABDOMINAL HYSTERECTOMY     partial   BREAST LUMPECTOMY Right 06/22/2017   Malignant   BREAST LUMPECTOMY WITH RADIOACTIVE SEED AND SENTINEL LYMPH NODE BIOPSY Right 06/22/2017   Procedure: RIGHT BREAST LUMPECTOMY WITH RADIOACTIVE SEED AND RIGHT AXILLARY DEEP SENTINEL LYMPH NODE BIOPSY WITH BLUE DYE INJECTION;  Surgeon: Claud Kelp, MD;  Location: [facility];  Service: General;   Laterality: Right;   BREAST LUMPECTOMY WITH RADIOACTIVE SEED LOCALIZATION Left 06/22/2017   Procedure: LEFT BREAST LUMPECTOMY WITH RADIOACTIVE SEED LOCALIZATION;  Surgeon: Claud Kelp, MD;  Location: [facility];  Service: General;  Laterality: Left;   CATARACT EXTRACTION W/ INTRAOCULAR LENS  IMPLANT, BILATERAL Bilateral    EXCISION OF BREAST BIOPSY Left 06/22/2017   benign   LOOP RECORDER INSERTION N/A 05/16/2022   Procedure: LOOP RECORDER INSERTION;  Surgeon: Lanier Prude, MD;  Location: [facility];  Service: Cardiovascular;  Laterality: N/A;   PERCUTANEOUS PINNING Right 06/22/2022   Procedure: PERCUTANEOUS PINNING OF RIGHT HIP;  Surgeon: Bjorn Pippin, MD;  Location: [facility];  Service: Orthopedics;  Laterality: Right;   TONSILLECTOMY     age 26   Patient Active Problem List   Diagnosis Date Noted   Right spastic hemiplegia (HCC) 12/14/2022   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   CAD in native artery 10/31/2022   Malnutrition of moderate degree 06/22/2022   Closed fracture of femur, neck (HCC) 06/21/2022   Urinary frequency 06/21/2022   Hypokalemia 06/21/2022   Heart murmur 06/21/2022   Hypothyroidism 06/21/2022   Dysphagia 05/17/2022   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  CVA (cerebral vascular accident) (HCC) 05/12/2022   Osteoporosis 01/24/2022   Atypical chest pain 07/19/2021   Temporomandibular joint (TMJ) pain 07/16/2020   Referred otalgia of both ears 07/16/2020   PAC (premature atrial contraction) 05/11/2020   PVC (premature ventricular contraction) 05/11/2020   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   Carotid bruit 11/13/2019   Pure hypercholesterolemia 11/13/2019   Malignant neoplasm of upper-outer quadrant of right breast in female, estrogen receptor positive (HCC) 05/22/2017   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   Diplopia 09/17/2014   Dizziness and giddiness 09/17/2014   Essential hypertension 07/22/2014   Chest pain  07/22/2014   Family history of heart disease 07/22/2014    REFERRING DIAG:  R29.6 (ICD-10-CM) - Repeated falls, closed fracture of Rt hip    THERAPY DIAG:  Muscle weakness (generalized)  Unsteadiness on feet  Abnormal posture  Rationale for Evaluation and Treatment Rehabilitation  PERTINENT HISTORY: hypertension, breast cancer, hyperlipidemia, Lt hip fracture secondary to fall 06/2022, CVA 06/2022   PRECAUTIONS:  Fall, Rt breast cancer with node removal, osteoporosis    SUBJECTIVE:                                                                                                                                                                                      SUBJECTIVE STATEMENT:  I think I walked to much. I usually take a break but didn't do that at home and I was tired.     PAIN:  Are you having pain? Not right now but my right hand and arm can be very sore at times.    OBJECTIVE: (objective measures completed at initial evaluation unless otherwise dated)  DIAGNOSTIC FINDINGS: Rt hip fracture and ORIF     COGNITION: Overall cognitive status: Within functional limits for tasks assessed                         SENSATION: WFL  POSTURE: rounded shoulders, forward head, flexed trunk , and weight shift left   PALPATION: NA   LOWER EXTREMITY ROM: Rt hip limited by 50%, hamstrings limited by 50% bil.    LOWER EXTREMITY MMT:   MMT Right eval Right  09/27/22 Left eval Right 10/30/22 Right  01/17/23 Left 10/30/22 Left 01/17/23  Hip flexion 3+ 4- 4- 4- 4 4- 4+  Hip extension           Hip abduction 3-  4    4  Hip adduction           Hip internal rotation           Hip external rotation  Knee flexion 3- 4- 3+  4  4+  Knee extension 3+ 4 4- 4 painful hip 4+ 4- 4+  Ankle dorsiflexion 3+ 4- 4- 4-  4   Ankle plantarflexion           Ankle inversion           Ankle eversion            (Blank rows = not tested)   FUNCTIONAL TESTS:   Eval: Timed up and go  (TUG): 1 min, 27 seconds   09/27/22: 5x sit to stand: 29 seconds with use of Lt hand  TUG: 27 seconds   10/30/22: 5x sit to stand: 23 sec use of LT hand     TUG: 26 sec with standard cane   11/20/22: 5x sit to stand: 18.68 seconds    TUG: 19.45 without device    3 minute walk test: 225 feet with a standard cane   01/10/23: 5x sit to stand: 15.77 seconds   01/17/23: TUG with cane: 15.41 seconds with cane    3 min walk test: 313 feet with cane    03/07/23: 3 min walk 275 feet with single point cane   TUG: 16 seconds  GAIT: Distance walked: 25 feet  Assistive device utilized: Hemi walker on Lt Level of assistance: SBA Comments: slow mobility, reduced trunk rotation, reduce Rt LE stance time and reduced Rt arm swing     TODAY'S TREATMENT:   03/07/23: Nustep L4 x 8 min with PT present to monitor and discuss progress - legs only- instructed pt to keep SPM >40-50 3 min walk test and TUG Leg press: seat 5, 45#  bil legs 2x10, Lt only 30#, Rt only 20# 2x10 bil each Gait around building with standard cane: CGA on gait belt by PT.  Full lap around clinic    D2 with 2# weight in Lt UE in standing to promote rotation x10 Sit to stand: 2x10 holding 5# kettlebell on Lt Obstacle course: stepping over hurdles, on 2" step and balance pad- CGA on gait belt with 1 loss of balance   02/28/23: Nustep L4 x 8 min with PT present to monitor and discuss progress - legs only- instructed pt to keep SPM >40-50 Leg press: seat 5, 45#  bil legs 2x10, Lt only 30#, Rt only 20# 2x10 bil each Gait around building with standard cane: CGA on gait belt by PT.  Full lap around clinic    D2 with 2# weight in Lt UE in standing to promote rotation x10 Sit to stand: 2x10 holding 5# kettlebell on Lt Obstacle course: stepping over hurdles, on 2" step and balance pad- CGA on gait belt with 1 loss of balance     02/21/23: Nustep L4 x 8 min with PT present to monitor and discuss progress - legs only- instructed pt to keep SPM  >40-50 Leg press: seat 5, 45#  bil legs 2x10, Lt only 35#, Rt only 25# 2x10 bil each Gait with weight on 1 arm for instability on Lt up and down hallway with 1 seated rest break.  5#- PT present for guarding.  D2 with 2# weight in Lt UE in standing to promote rotation x10 Sit to stand: 2x10 holding 5# kettlebell on Lt Weaving in/out of cones on the floor without device- CGA by PT on gait belt Negotiating hurdles in // bars without UE support    PATIENT EDUCATION:  Education details: Access Code: [email or web address] Person educated: Patient Education method: Programmer, multimedia, Demonstration,  and Handouts Education comprehension: verbalized understanding and returned demonstration   HOME EXERCISE PROGRAM: Access Code: [email or web address] URL: [email or web address] Date: 09/27/2022 Prepared by: Tresa Endo  Exercises - Seated Long Arc Quad  - 3 x daily - 7 x weekly - 2 sets - 10 reps - 5 hold - Seated March   - 3 x daily - 7 x weekly - 3 sets - 10 reps - Seated Heel Toe Raises   - 3 x daily - 7 x weekly - 2 sets - 10 reps - Seated Isometric Hip Adduction with Ball  - 3 x daily - 7 x weekly - 2 sets - 10 reps - Sit to Stand with Armchair  - 2 x daily - 7 x weekly - 2 sets - 5-10 reps - Standing Hip Abduction with Counter Support  - 1 x daily - 7 x weekly - 2 sets - 10 reps - Heel Raises with Counter Support  - 2 x daily - 7 x weekly - 2 sets - 10 reps  Patient Education - Walking with a Single [employer] - Modified 4 Point Gait Pattern  ASSESSMENT:   CLINICAL IMPRESSION:    Pt continues to do more at home.  She is walking longer distances and is standing to clean out her freezer.   She was able to walk with her dog to the end of the driveway and back.  No change in objective tests however, put used a single point cane today vs a quad cane and required less guarding as she is more stable.  Pt reports improved endurance and confidence with balance tasks at home and in the community.   Patient will benefit  from skilled PT to address the below impairments and improve overall function.   OBJECTIVE IMPAIRMENTS: Abnormal gait, decreased activity tolerance, decreased balance, decreased mobility, difficulty walking, decreased strength, decreased safety awareness, impaired perceived functional ability, impaired flexibility, postural dysfunction, and pain.    ACTIVITY LIMITATIONS: carrying, lifting, sitting, standing, stairs, transfers, hygiene/grooming, and locomotion level   PARTICIPATION LIMITATIONS: meal prep, cleaning, laundry, driving, shopping, and community activity   PERSONAL FACTORS: Age, Past/current experiences, and 1-2 comorbidities: CVA, falls, Rt hip fracture with ORIF  are also affecting patient's functional outcome.    REHAB POTENTIAL: Good   CLINICAL DECISION MAKING: Evolving/moderate complexity   EVALUATION COMPLEXITY: Moderate     GOALS: Goals reviewed with patient? Yes   SHORT TERM GOALS: Target date: 09/06/2022   Be independent in initial HEP Baseline: Goal status: Goal met 08/14/22   2.  Improve LE strength to perform sit to stand with moderate Lt UE support Baseline:  Goal status: Goal met 08/28/22  3.  Perform TUG in < or = to 60 seconds to reduce falls risk Baseline: 27 seconds (09/27/22) Goal status: MET      LONG TERM GOALS: Target date:  03/19/23   Be independent in advanced HEP Baseline: independent in current HEP and further progress is needed  Goal status: in progress    2.  Perform TUG in < or = to 13 seconds to reduce falls risk Baseline: 15.41 seconds (01/17/23) Goal status: REVISED   3.  ambulate > or = to 375 feet in 3 minutes to improve community ambulation and independence  Baseline: 313 (01/17/23) Goal status: REVISED    4.  Ambulate with hemi walker with supervision or no guard due to improved gait Baseline: able to do this Goal status: MET   5.  perform 5x sit to stand in <  or = to 14 seconds to reduce falls risk  Baseline: 15.77  seconds (01/10/23)  Goal Status: In progress   6. Ambulate with cane for all distances and demonstrate independence due to improved stability  Baseline: using walker   Goal status: MET     PLAN:   PT FREQUENCY: 1x/week   PT DURATION: 8 weeks   PLANNED INTERVENTIONS: Therapeutic exercises, Therapeutic activity, Neuromuscular re-education, Balance training, Gait training, Patient/Family education, Self Care, Joint mobilization, Stair training, Dry Needling, Electrical stimulation, Cryotherapy, Moist heat, Taping, Manual therapy, and Re-evaluation   PLAN FOR NEXT SESSION: Continue to work on gait , strength and balance. Try 6 min walk test  Lorrene Reid, PT 03/07/23 3:30 PM

## 2023-03-07 NOTE — Telephone Encounter (Signed)
*  STAT* If patient is at the pharmacy, call can be transferred to refill team.   1. Which medications need to be refilled? (please list name of each medication and dose if known) metoprolol tartrate (LOPRESSOR) 25 MG tablet   2. Which pharmacy/location (including street and city if local pharmacy) is medication to be sent to?  [facility] [address] [place], [place] - [address]      3. Do they need a 30 day or 90 day supply? 90 DAY

## 2023-03-08 LAB — LIPID PANEL
Chol/HDL Ratio: 2 ratio (ref 0.0–4.4)
Cholesterol, Total: 166 mg/dL (ref 100–199)
HDL: 84 mg/dL (ref >39)
LDL Chol Calc (NIH): 64 mg/dL (ref 0–99)
Triglycerides: 103 mg/dL (ref 0–149)
VLDL Cholesterol Cal: 18 mg/dL (ref 5–40)

## 2023-03-09 ENCOUNTER — Telehealth: Payer: Self-pay | Admitting: Cardiovascular Disease

## 2023-03-09 NOTE — Telephone Encounter (Signed)
Patient states she recently had labs and would like a call back to discuss the results.

## 2023-03-09 NOTE — Telephone Encounter (Addendum)
Returned call to patient and let her know that results are not ready yet. Patient will need Zetia refills once results are reviewed.

## 2023-03-14 ENCOUNTER — Ambulatory Visit: Payer: Medicare PPO

## 2023-03-14 DIAGNOSIS — R293 Abnormal posture: Secondary | ICD-10-CM

## 2023-03-14 DIAGNOSIS — M6281 Muscle weakness (generalized): Secondary | ICD-10-CM | POA: Diagnosis not present

## 2023-03-14 DIAGNOSIS — R2681 Unsteadiness on feet: Secondary | ICD-10-CM | POA: Diagnosis not present

## 2023-03-14 NOTE — Therapy (Signed)
OUTPATIENT PHYSICAL THERAPY TREATMENT NOTE   Patient Name: Angela Buchanan MRN: [id number] DOB:1950/12/24, 72 y.o., female Today's Date: 03/14/2023  PCP:  Garth Bigness, MD   REFERRING PROVIDER:  Garth Bigness, MD      END OF SESSION:   PT End of Session - 03/14/23 1539     Visit Number 28    Date for PT Re-Evaluation 05/28/23    Authorization Type 36 visit total (PT)    Authorization Time Period 8 visits 4/20-6/17/24- requested more visits on 03/14/23    Authorization - Visit Number 7    Authorization - Number of Visits 8    Progress Note Due on Visit 30    PT Start Time 1448    PT Stop Time 1540    PT Time Calculation (min) 52 min    Activity Tolerance Patient tolerated treatment well    Behavior During Therapy [facility] for tasks assessed/performed                                     Past Medical History:  Diagnosis Date   Atypical chest pain 07/19/2021   Bigeminy    no current med.   Breast cancer (HCC) 06/2017   right   Bruises easily    CAD in native artery 10/31/2022   Carotid bruit 11/13/2019   Dental crowns present    History of diverticulitis    Hypertension    states under control with med., has been on med. x 5 yr.   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   Past Surgical History:  Procedure Laterality Date   ABDOMINAL HYSTERECTOMY     partial   BREAST LUMPECTOMY Right 06/22/2017   Malignant   BREAST LUMPECTOMY WITH RADIOACTIVE SEED AND SENTINEL LYMPH NODE BIOPSY Right 06/22/2017   Procedure: RIGHT BREAST LUMPECTOMY WITH RADIOACTIVE SEED AND RIGHT AXILLARY DEEP SENTINEL LYMPH NODE BIOPSY WITH BLUE DYE INJECTION;  Surgeon: Claud Kelp, MD;  Location: [facility];  Service: General;  Laterality: Right;   BREAST LUMPECTOMY WITH RADIOACTIVE SEED LOCALIZATION Left 06/22/2017   Procedure: LEFT BREAST LUMPECTOMY WITH RADIOACTIVE SEED LOCALIZATION;  Surgeon: Claud Kelp, MD;  Location: [facility];  Service: General;  Laterality: Left;   CATARACT EXTRACTION W/ INTRAOCULAR LENS  IMPLANT, BILATERAL Bilateral    EXCISION OF BREAST BIOPSY Left 06/22/2017   benign   LOOP RECORDER INSERTION N/A 05/16/2022   Procedure: LOOP RECORDER INSERTION;  Surgeon: Lanier Prude, MD;  Location: [facility];  Service: Cardiovascular;  Laterality: N/A;   PERCUTANEOUS PINNING Right 06/22/2022   Procedure: PERCUTANEOUS PINNING OF RIGHT HIP;  Surgeon: Bjorn Pippin, MD;  Location: [facility];  Service: Orthopedics;  Laterality: Right;   TONSILLECTOMY     age 64   Patient Active Problem List   Diagnosis Date Noted   Right spastic hemiplegia (HCC) 12/14/2022   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   CAD in native artery 10/31/2022   Malnutrition of moderate degree 06/22/2022   Closed fracture of femur, neck (HCC) 06/21/2022   Urinary frequency 06/21/2022   Hypokalemia 06/21/2022   Heart murmur 06/21/2022   Hypothyroidism 06/21/2022   Dysphagia 05/17/2022  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 05/11/2020   PVC (premature ventricular contraction) 05/11/2020   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   Carotid bruit 11/13/2019   Pure hypercholesterolemia 11/13/2019   Malignant neoplasm of upper-outer quadrant of right breast in female, estrogen receptor positive (HCC) 05/22/2017   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   Diplopia 09/17/2014   Dizziness and giddiness 09/17/2014   Essential  hypertension 07/22/2014   Chest pain 07/22/2014   Family history of heart disease 07/22/2014    REFERRING DIAG:  R29.6 (ICD-10-CM) - Repeated falls, closed fracture of Rt hip    THERAPY DIAG:  Muscle weakness (generalized) - Plan: PT plan of care cert/re-cert  Unsteadiness on feet - Plan: PT plan of care cert/re-cert  Abnormal posture - Plan: PT plan of care cert/re-cert  Rationale for Evaluation and Treatment Rehabilitation  PERTINENT HISTORY: hypertension, breast cancer, hyperlipidemia, Lt hip fracture secondary to fall 06/2022, CVA 06/2022   PRECAUTIONS:  Fall, Rt breast cancer with node removal, osteoporosis    SUBJECTIVE:                                                                                                                                                                                      SUBJECTIVE STATEMENT:  Therapy is helping me. I am able to walk and stand so much longer now.     PAIN:  Are you having pain? Not right now but my right hand and arm can be very sore at times.    OBJECTIVE: (objective measures completed at initial evaluation unless otherwise dated)  DIAGNOSTIC FINDINGS: Rt hip fracture and ORIF     COGNITION: Overall cognitive status: Within functional limits for tasks assessed                         SENSATION: WFL  POSTURE: rounded shoulders, forward head, flexed trunk , and weight shift left   PALPATION: NA   LOWER EXTREMITY ROM: Rt hip limited by 50%, hamstrings limited by 50% bil.    LOWER EXTREMITY MMT:   MMT Right eval Right  09/27/22 Left eval Right 10/30/22 Right  01/17/23 Left 10/30/22 Left 01/17/23  Hip flexion 3+ 4- 4- 4- 4 4- 4+  Hip extension           Hip abduction 3-  4    4  Hip adduction           Hip  internal rotation           Hip external rotation           Knee flexion 3- 4- 3+  4  4+  Knee extension 3+ 4 4- 4 painful hip 4+ 4- 4+  Ankle dorsiflexion 3+ 4- 4- 4-  4   Ankle plantarflexion            Ankle inversion           Ankle eversion            (Blank rows = not tested)   FUNCTIONAL TESTS:   Eval: Timed up and go (TUG): 1 min, 27 seconds   09/27/22: 5x sit to stand: 29 seconds with use of Lt hand  TUG: 27 seconds   10/30/22: 5x sit to stand: 23 sec use of LT hand     TUG: 26 sec with standard cane   11/20/22: 5x sit to stand: 18.68 seconds    TUG: 19.45 without device    3 minute walk test: 225 feet with a standard cane   01/10/23: 5x sit to stand: 15.77 seconds   01/17/23: TUG with cane: 15.41 seconds with cane    3 min walk test: 313 feet with cane    03/07/23: 3 min walk 275 feet with single point cane   TUG: 16 seconds   03/14/23: 6 min walk test: 723 feet with standard cane    TUG: 14.16 without cane    5x sit to stand: 12.15 seconds without hand       TODAY'S TREATMENT:   03/14/23: Nustep L4 x 8 min with PT present to monitor and discuss progress - legs only- instructed pt to keep SPM >40-50 5x sit to stand, TUG and 6 min walk test- see below  D2 with 2# weight in Lt UE in standing to promote rotation x10 Sit to stand: 2x10 holding 5# kettlebell on Lt Obstacle course: stepping over hurdles, on 2" step and balance pad- CGA on gait belt with 1 loss of balance   03/07/23: Nustep L4 x 8 min with PT present to monitor and discuss progress - legs only- instructed pt to keep SPM >40-50 3 min walk test and TUG Leg press: seat 5, 45#  bil legs 2x10, Lt only 30#, Rt only 20# 2x10 bil each Gait around building with standard cane: CGA on gait belt by PT.  Full lap around clinic    D2 with 2# weight in Lt UE in standing to promote rotation x10 Sit to stand: 2x10 holding 5# kettlebell on Lt Obstacle course: stepping over hurdles, on 2" step and balance pad- CGA on gait belt with 1 loss of balance   02/28/23: Nustep L4 x 8 min with PT present to monitor and discuss progress - legs only- instructed pt to keep SPM >40-50 Leg press: seat 5, 45#  bil legs 2x10, Lt only 30#, Rt  only 20# 2x10 bil each Gait around building with standard cane: CGA on gait belt by PT.  Full lap around clinic    D2 with 2# weight in Lt UE in standing to promote rotation x10 Sit to stand: 2x10 holding 5# kettlebell on Lt Obstacle course: stepping over hurdles, on 2" step and balance pad- CGA on gait belt with 1 loss of balance     02/21/23: Nustep L4 x 8 min with PT present to monitor and discuss progress - legs only- instructed pt to keep SPM >40-50 Leg press: seat 5,  45#  bil legs 2x10, Lt only 35#, Rt only 25# 2x10 bil each Gait with weight on 1 arm for instability on Lt up and down hallway with 1 seated rest break.  5#- PT present for guarding.  D2 with 2# weight in Lt UE in standing to promote rotation x10 Sit to stand: 2x10 holding 5# kettlebell on Lt Weaving in/out of cones on the floor without device- CGA by PT on gait belt Negotiating hurdles in // bars without UE support    PATIENT EDUCATION:  Education details: Access Code: [email or web address] Person educated: Patient Education method: Explanation, Demonstration, and Handouts Education comprehension: verbalized understanding and returned demonstration   HOME EXERCISE PROGRAM: Access Code: [email or web address] URL: [email or web address] Date: 09/27/2022 Prepared by: Tresa Endo  Exercises - Seated Long Arc Quad  - 3 x daily - 7 x weekly - 2 sets - 10 reps - 5 hold - Seated March   - 3 x daily - 7 x weekly - 3 sets - 10 reps - Seated Heel Toe Raises   - 3 x daily - 7 x weekly - 2 sets - 10 reps - Seated Isometric Hip Adduction with Ball  - 3 x daily - 7 x weekly - 2 sets - 10 reps - Sit to Stand with Armchair  - 2 x daily - 7 x weekly - 2 sets - 5-10 reps - Standing Hip Abduction with Counter Support  - 1 x daily - 7 x weekly - 2 sets - 10 reps - Heel Raises with Counter Support  - 2 x daily - 7 x weekly - 2 sets - 10 reps  Patient Education - Walking with a Single [employer] - Modified 4 Point Gait Pattern  ASSESSMENT:    CLINICAL IMPRESSION:    Pt is making consistent progress regarding gait, balance, and endurance.  Pt reports improved endurance and confidence with balance tasks at home and in the community.  She is using her cane at home 40% at home for safety when she is fatigued. Time improved with TUG and 5x sit to stand, indicating improved balance overall.  Pt participated in 6 min walk test with standard cane and walked 723 feet. Pt requires less guarding and reduced reliance on her cane with walking.    Patient will benefit from skilled PT to address the below impairments and improve overall function.   OBJECTIVE IMPAIRMENTS: Abnormal gait, decreased activity tolerance, decreased balance, decreased mobility, difficulty walking, decreased strength, decreased safety awareness, impaired perceived functional ability, impaired flexibility, postural dysfunction, and pain.    ACTIVITY LIMITATIONS: carrying, lifting, sitting, standing, stairs, transfers, hygiene/grooming, and locomotion level   PARTICIPATION LIMITATIONS: meal prep, cleaning, laundry, driving, shopping, and community activity   PERSONAL FACTORS: Age, Past/current experiences, and 1-2 comorbidities: CVA, falls, Rt hip fracture with ORIF  are also affecting patient's functional outcome.    REHAB POTENTIAL: Good   CLINICAL DECISION MAKING: Evolving/moderate complexity   EVALUATION COMPLEXITY: Moderate     GOALS: Goals reviewed with patient? Yes   SHORT TERM GOALS: Target date: 09/06/2022   Be independent in initial HEP Baseline: Goal status: Goal met 08/14/22   2.  Improve LE strength to perform sit to stand with moderate Lt UE support Baseline:  Goal status: Goal met 08/28/22  3.  Perform TUG in < or = to 60 seconds to reduce falls risk Baseline: 27 seconds (09/27/22) Goal status: MET      LONG TERM GOALS: Target date:  05/28/23   Be independent in  advanced HEP Baseline: independent in current HEP and further progress is needed  as she becomes more stable (03/14/23)  Goal status: in progress    2.  Perform TUG in < or = to 13 seconds to reduce falls risk Baseline: 14.16 seconds without cane (03/14/23) Goal status: IN PROGRESS   3.  ambulate > or = to 875 feet in 6 minutes to improve community ambulation and independence  Baseline: 723 feet (03/14/23) Goal status: REVISED    4.  Ambulate with hemi walker with supervision or no guard due to improved gait Baseline: able to do this Goal status: MET   5.  perform 5x sit to stand in < or = to 12 seconds to reduce falls risk  Baseline: 12.15 seconds (03/14/23)  Goal Status: In progress   6. Ambulate without device for home distances 100% of the time due to improved stability and reduced falls risk  Baseline: using cane 40% when fatigued (03/14/23)  Goal status: in progress   7. Improve balance to walk her dog short distances with independence   Baseline: not safe to walk independently with her dog (03/14/23)  Goal status: NEW     PLAN:   PT FREQUENCY: 1x/week   PT DURATION: 10 weeks   PLANNED INTERVENTIONS: Therapeutic exercises, Therapeutic activity, Neuromuscular re-education, Balance training, Gait training, Patient/Family education, Self Care, Joint mobilization, Stair training, Dry Needling, Electrical stimulation, Cryotherapy, Moist heat, Taping, Manual therapy, and Re-evaluation   PLAN FOR NEXT SESSION: Continue to work on gait , strength and balance.  Lorrene Reid, PT 03/14/23 3:51 PM

## 2023-03-16 MED ORDER — EZETIMIBE 10 MG PO TABS: 10.0000 mg | ORAL_TABLET | Freq: Every day | ORAL | 3 refills | Status: DC

## 2023-03-16 NOTE — Telephone Encounter (Signed)
Patient called to follow-up on lab results. 

## 2023-03-16 NOTE — Telephone Encounter (Signed)
Called patient, and reviewed lab results with patient. Refills sent.      "Labs resulted.   Cholesterol panel much improved. LDL at goal <70. Continue Pravstatin and Zetia at present doses.    Angela Sorrow, NP"

## 2023-03-16 NOTE — Telephone Encounter (Signed)
Labs resulted.  Cholesterol panel much improved. LDL at goal <70. Continue Pravstatin and Zetia at present doses.   Alver Sorrow, NP

## 2023-03-16 NOTE — Addendum Note (Signed)
Addended by: Marlene Lard on: 03/16/2023 10:50 AM   Modules accepted: Orders

## 2023-03-19 ENCOUNTER — Ambulatory Visit: Payer: Medicare PPO

## 2023-03-19 DIAGNOSIS — R2681 Unsteadiness on feet: Secondary | ICD-10-CM | POA: Diagnosis not present

## 2023-03-19 DIAGNOSIS — M6281 Muscle weakness (generalized): Secondary | ICD-10-CM | POA: Diagnosis not present

## 2023-03-19 DIAGNOSIS — R293 Abnormal posture: Secondary | ICD-10-CM

## 2023-03-19 NOTE — Therapy (Signed)
OUTPATIENT PHYSICAL THERAPY TREATMENT NOTE   Patient Name: Angela Buchanan MRN: [id number] DOB:Oct 09, 1950, 72 y.o., female Today's Date: 03/19/2023  PCP:  Garth Bigness, MD   REFERRING PROVIDER:  Garth Bigness, MD      END OF SESSION:   PT End of Session - 03/19/23 1234     Visit Number 29    Date for PT Re-Evaluation 05/28/23    Authorization Type 36 visit total (PT)    Authorization Time Period 8 visits 4/20-6/17/24- requested more visits on 03/14/23    Authorization - Visit Number 1    Authorization - Number of Visits 10    Progress Note Due on Visit 30    PT Start Time 1147    PT Stop Time 1229    PT Time Calculation (min) 42 min    Activity Tolerance Patient tolerated treatment well    Behavior During Therapy [facility] for tasks assessed/performed                                      Past Medical History:  Diagnosis Date   Atypical chest pain 07/19/2021   Bigeminy    no current med.   Breast cancer (HCC) 06/2017   right   Bruises easily    CAD in native artery 10/31/2022   Carotid bruit 11/13/2019   Dental crowns present    History of diverticulitis    Hypertension    states under control with med., has been on med. x 5 yr.   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   Past Surgical History:  Procedure Laterality Date   ABDOMINAL HYSTERECTOMY     partial   BREAST LUMPECTOMY Right 06/22/2017   Malignant   BREAST LUMPECTOMY WITH RADIOACTIVE SEED AND SENTINEL LYMPH NODE BIOPSY Right 06/22/2017   Procedure: RIGHT BREAST LUMPECTOMY WITH RADIOACTIVE SEED AND RIGHT AXILLARY DEEP SENTINEL LYMPH NODE BIOPSY WITH BLUE DYE INJECTION;  Surgeon: Claud Kelp, MD;  Location: [facility];  Service: General;  Laterality: Right;   BREAST LUMPECTOMY WITH RADIOACTIVE SEED LOCALIZATION Left 06/22/2017   Procedure: LEFT BREAST LUMPECTOMY WITH RADIOACTIVE SEED LOCALIZATION;  Surgeon: Claud Kelp, MD;  Location: [facility];  Service: General;  Laterality: Left;   CATARACT EXTRACTION W/ INTRAOCULAR LENS  IMPLANT, BILATERAL Bilateral    EXCISION OF BREAST BIOPSY Left 06/22/2017   benign   LOOP RECORDER INSERTION N/A 05/16/2022   Procedure: LOOP RECORDER INSERTION;  Surgeon: Lanier Prude, MD;  Location: [facility];  Service: Cardiovascular;  Laterality: N/A;   PERCUTANEOUS PINNING Right 06/22/2022   Procedure: PERCUTANEOUS PINNING OF RIGHT HIP;  Surgeon: Bjorn Pippin, MD;  Location: [facility];  Service: Orthopedics;  Laterality: Right;   TONSILLECTOMY     age 46   Patient Active Problem List   Diagnosis Date Noted   Right spastic hemiplegia (HCC) 12/14/2022   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   CAD in native artery 10/31/2022   Malnutrition of moderate degree 06/22/2022   Closed fracture of femur, neck (HCC) 06/21/2022   Urinary frequency 06/21/2022   Hypokalemia 06/21/2022   Heart murmur 06/21/2022   Hypothyroidism 06/21/2022   Dysphagia  05/17/2022   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 05/11/2020   PVC (premature ventricular contraction) 05/11/2020   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   Carotid bruit 11/13/2019   Pure hypercholesterolemia 11/13/2019   Malignant neoplasm of upper-outer quadrant of right breast in female, estrogen receptor positive (HCC) 05/22/2017   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   Diplopia 09/17/2014   Dizziness and giddiness 09/17/2014   Essential  hypertension 07/22/2014   Chest pain 07/22/2014   Family history of heart disease 07/22/2014    REFERRING DIAG:  R29.6 (ICD-10-CM) - Repeated falls, closed fracture of Rt hip    THERAPY DIAG:  Muscle weakness (generalized)  Unsteadiness on feet  Abnormal posture  Rationale for Evaluation and Treatment Rehabilitation  PERTINENT HISTORY: hypertension, breast cancer, hyperlipidemia, Lt hip fracture secondary to fall 06/2022, CVA 06/2022   PRECAUTIONS:  Fall, Rt breast cancer with node removal, osteoporosis    SUBJECTIVE:                                                                                                                                                                                      SUBJECTIVE STATEMENT:  I walked in the post office and grocery store.  I want to be able to walk my dog    PAIN:  Are you having pain? Not right now but my right hand and arm can be very sore at times.    OBJECTIVE: (objective measures completed at initial evaluation unless otherwise dated)  DIAGNOSTIC FINDINGS: Rt hip fracture and ORIF     COGNITION: Overall cognitive status: Within functional limits for tasks assessed                         SENSATION: WFL  POSTURE: rounded shoulders, forward head, flexed trunk , and weight shift left   PALPATION: NA   LOWER EXTREMITY ROM: Rt hip limited by 50%, hamstrings limited by 50% bil.    LOWER EXTREMITY MMT:   MMT Right eval Right  09/27/22 Left eval Right 10/30/22 Right  01/17/23 Left 10/30/22 Left 01/17/23  Hip flexion 3+ 4- 4- 4- 4 4- 4+  Hip extension           Hip abduction 3-  4    4  Hip adduction           Hip internal rotation           Hip external rotation  Knee flexion 3- 4- 3+  4  4+  Knee extension 3+ 4 4- 4 painful hip 4+ 4- 4+  Ankle dorsiflexion 3+ 4- 4- 4-  4   Ankle plantarflexion           Ankle inversion           Ankle eversion            (Blank rows = not tested)   FUNCTIONAL TESTS:    Eval: Timed up and go (TUG): 1 min, 27 seconds   09/27/22: 5x sit to stand: 29 seconds with use of Lt hand  TUG: 27 seconds   10/30/22: 5x sit to stand: 23 sec use of LT hand     TUG: 26 sec with standard cane   11/20/22: 5x sit to stand: 18.68 seconds    TUG: 19.45 without device    3 minute walk test: 225 feet with a standard cane   01/10/23: 5x sit to stand: 15.77 seconds   01/17/23: TUG with cane: 15.41 seconds with cane    3 min walk test: 313 feet with cane    03/07/23: 3 min walk 275 feet with single point cane   TUG: 16 seconds   03/14/23: 6 min walk test: 723 feet with standard cane    TUG: 14.16 without cane    5x sit to stand: 12.15 seconds without hand       TODAY'S TREATMENT:   03/14/23: Nustep L4 x 8 min with PT present to monitor and discuss progress - legs only- instructed pt to keep SPM >40-50 Leg press: seat 5, 45#  bil legs 2x10, Lt only 30#, Rt only 20# 2x10 bil each D2 with 2# weight in Lt UE in standing to promote rotation x10 Sit to stand: 2x10 holding 5# kettlebell on Lt Gait with standard cane around clinic- 1 full lap- no gait belt needed, CGA by PT for safety Obstacle course: stepping over hurdles, on 2" step and balance pad- CGA on gait belt with 1 loss of balance   03/07/23: Nustep L4 x 8 min with PT present to monitor and discuss progress - legs only- instructed pt to keep SPM >40-50 3 min walk test and TUG Leg press: seat 5, 45#  bil legs 2x10, Lt only 30#, Rt only 20# 2x10 bil each Gait around building with standard cane: CGA on gait belt by PT.  Full lap around clinic    D2 with 2# weight in Lt UE in standing to promote rotation x10 Sit to stand: 2x10 holding 5# kettlebell on Lt Obstacle course: stepping over hurdles, on 2" step and balance pad- CGA on gait belt with 1 loss of balance   02/28/23: Nustep L4 x 8 min with PT present to monitor and discuss progress - legs only- instructed pt to keep SPM >40-50 Leg press: seat 5, 45#  bil legs 2x10,  Lt only 30#, Rt only 20# 2x10 bil each Gait around building with standard cane: CGA on gait belt by PT.  Full lap around clinic    D2 with 2# weight in Lt UE in standing to promote rotation x10 Sit to stand: 2x10 holding 5# kettlebell on Lt Obstacle course: stepping over hurdles, on 2" step and balance pad- CGA on gait belt with 1 loss of balance     02/21/23: Nustep L4 x 8 min with PT present to monitor and discuss progress - legs only- instructed pt to keep SPM >40-50 Leg press: seat 5, 45#  bil legs 2x10, Lt only 35#, Rt only 25# 2x10 bil each Gait with weight on 1 arm for instability on Lt up and down hallway with 1 seated rest break.  5#- PT present for guarding.  D2 with 2# weight in Lt UE in standing to promote rotation x10 Sit to stand: 2x10 holding 5# kettlebell on Lt Weaving in/out of cones on the floor without device- CGA by PT on gait belt Negotiating hurdles in // bars without UE support    PATIENT EDUCATION:  Education details: Access Code: [email or web address] Person educated: Patient Education method: Explanation, Demonstration, and Handouts Education comprehension: verbalized understanding and returned demonstration   HOME EXERCISE PROGRAM: Access Code: [email or web address] URL: [email or web address] Date: 09/27/2022 Prepared by: Tresa Endo  Exercises - Seated Long Arc Quad  - 3 x daily - 7 x weekly - 2 sets - 10 reps - 5 hold - Seated March   - 3 x daily - 7 x weekly - 3 sets - 10 reps - Seated Heel Toe Raises   - 3 x daily - 7 x weekly - 2 sets - 10 reps - Seated Isometric Hip Adduction with Ball  - 3 x daily - 7 x weekly - 2 sets - 10 reps - Sit to Stand with Armchair  - 2 x daily - 7 x weekly - 2 sets - 5-10 reps - Standing Hip Abduction with Counter Support  - 1 x daily - 7 x weekly - 2 sets - 10 reps - Heel Raises with Counter Support  - 2 x daily - 7 x weekly - 2 sets - 10 reps  Patient Education - Walking with a Single [employer] - Modified 4 Point Gait  Pattern  ASSESSMENT:   CLINICAL IMPRESSION:    Pt is making consistent progress regarding gait, balance, and endurance.  Pt reports improved endurance and confidence with balance tasks at home and in the community.  She continues to use her cane at home 40% at home for safety when she is fatigued. Time improved with TUG and 5x sit to stand last session, indicating improved balance overall.  Pt with overall improved endurance and requires less rest  between exercises.  Pt requires less guarding and reduced reliance on her cane with walking.    Patient will benefit from skilled PT to address the below impairments and improve overall function.   OBJECTIVE IMPAIRMENTS: Abnormal gait, decreased activity tolerance, decreased balance, decreased mobility, difficulty walking, decreased strength, decreased safety awareness, impaired perceived functional ability, impaired flexibility, postural dysfunction, and pain.    ACTIVITY LIMITATIONS: carrying, lifting, sitting, standing, stairs, transfers, hygiene/grooming, and locomotion level   PARTICIPATION LIMITATIONS: meal prep, cleaning, laundry, driving, shopping, and community activity   PERSONAL FACTORS: Age, Past/current experiences, and 1-2 comorbidities: CVA, falls, Rt hip fracture with ORIF  are also affecting patient's functional outcome.    REHAB POTENTIAL: Good   CLINICAL DECISION MAKING: Evolving/moderate complexity   EVALUATION COMPLEXITY: Moderate     GOALS: Goals reviewed with patient? Yes   SHORT TERM GOALS: Target date: 09/06/2022   Be independent in initial HEP Baseline: Goal status: Goal met 08/14/22   2.  Improve LE strength to perform sit to stand with moderate Lt UE support Baseline:  Goal status: Goal met 08/28/22  3.  Perform TUG in < or = to 60 seconds to reduce falls risk Baseline: 27 seconds (09/27/22) Goal status: MET      LONG TERM GOALS: Target date:  05/28/23   Be independent in  advanced HEP Baseline:  independent in current HEP and further progress is needed as she becomes more stable (03/14/23)  Goal status: in progress    2.  Perform TUG in < or = to 13 seconds to reduce falls risk Baseline: 14.16 seconds without cane (03/14/23) Goal status: IN PROGRESS   3.  ambulate > or = to 875 feet in 6 minutes to improve community ambulation and independence  Baseline: 723 feet (03/14/23) Goal status: REVISED    4.  Ambulate with hemi walker with supervision or no guard due to improved gait Baseline: able to do this Goal status: MET   5.  perform 5x sit to stand in < or = to 12 seconds to reduce falls risk  Baseline: 12.15 seconds (03/14/23)  Goal Status: In progress   6. Ambulate without device for home distances 100% of the time due to improved stability and reduced falls risk  Baseline: using cane 40% when fatigued (03/14/23)  Goal status: in progress   7. Improve balance to walk her dog short distances with independence   Baseline: not safe to walk independently with her dog (03/14/23)  Goal status: NEW     PLAN:   PT FREQUENCY: 1x/week   PT DURATION: 10 weeks   PLANNED INTERVENTIONS: Therapeutic exercises, Therapeutic activity, Neuromuscular re-education, Balance training, Gait training, Patient/Family education, Self Care, Joint mobilization, Stair training, Dry Needling, Electrical stimulation, Cryotherapy, Moist heat, Taping, Manual therapy, and Re-evaluation   PLAN FOR NEXT SESSION: Continue to work on gait , strength and balance. 30th visit progress note  Lorrene Reid, PT 03/19/23 12:39 PM

## 2023-03-26 NOTE — Progress Notes (Signed)
[facility] Summary Report / Loop Recorder 

## 2023-03-27 DIAGNOSIS — R079 Chest pain, unspecified: Secondary | ICD-10-CM | POA: Diagnosis not present

## 2023-03-27 DIAGNOSIS — R0602 Shortness of breath: Secondary | ICD-10-CM | POA: Diagnosis not present

## 2023-03-28 ENCOUNTER — Encounter: Payer: Self-pay | Admitting: Physical Therapy

## 2023-03-28 ENCOUNTER — Ambulatory Visit: Payer: Medicare PPO | Admitting: Physical Therapy

## 2023-03-28 DIAGNOSIS — R2681 Unsteadiness on feet: Secondary | ICD-10-CM | POA: Diagnosis not present

## 2023-03-28 DIAGNOSIS — R293 Abnormal posture: Secondary | ICD-10-CM | POA: Diagnosis not present

## 2023-03-28 DIAGNOSIS — M6281 Muscle weakness (generalized): Secondary | ICD-10-CM | POA: Diagnosis not present

## 2023-03-28 NOTE — Therapy (Signed)
OUTPATIENT PHYSICAL THERAPY TREATMENT NOTE/PROGRESS NOTE   Patient Name: Angela Buchanan MRN: [id number] DOB:10-Jun-1951, 72 y.o., female Today's Date: 03/28/2023  PCP:  Garth Bigness, MD   REFERRING PROVIDER:  Garth Bigness, MD   Progress Note Reporting Period 01/17/23 to 03/28/23  See note below for Objective Data and Assessment of Progress/Goals.          END OF SESSION:   PT End of Session - 03/28/23 1418     Visit Number 30    Date for PT Re-Evaluation 05/28/23    Authorization Type 36 visit total (PT)    Authorization Time Period 8 visits 4/20-6/17/24- requested more visits on 03/14/23    Progress Note Due on Visit 40    PT Start Time 1402    PT Stop Time 1441    PT Time Calculation (min) 39 min    Activity Tolerance Patient tolerated treatment well    Behavior During Therapy [facility] for tasks assessed/performed                                       Past Medical History:  Diagnosis Date   Atypical chest pain 07/19/2021   Bigeminy    no current med.   Breast cancer (HCC) 06/2017   right   Bruises easily    CAD in native artery 10/31/2022   Carotid bruit 11/13/2019   Dental crowns present    History of diverticulitis    Hypertension    states under control with med., has been on med. x 5 yr.   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   Past Surgical History:  Procedure Laterality Date   ABDOMINAL HYSTERECTOMY     partial   BREAST LUMPECTOMY Right 06/22/2017   Malignant   BREAST LUMPECTOMY WITH RADIOACTIVE SEED AND SENTINEL LYMPH NODE BIOPSY Right 06/22/2017   Procedure: RIGHT BREAST LUMPECTOMY WITH RADIOACTIVE SEED AND RIGHT AXILLARY DEEP SENTINEL LYMPH NODE BIOPSY WITH  BLUE DYE INJECTION;  Surgeon: Claud Kelp, MD;  Location: [facility];  Service: General;  Laterality: Right;   BREAST LUMPECTOMY WITH RADIOACTIVE SEED LOCALIZATION Left 06/22/2017   Procedure: LEFT BREAST LUMPECTOMY WITH RADIOACTIVE SEED LOCALIZATION;  Surgeon: Claud Kelp, MD;  Location: [facility];  Service: General;  Laterality: Left;   CATARACT EXTRACTION W/ INTRAOCULAR LENS  IMPLANT, BILATERAL Bilateral    EXCISION OF BREAST BIOPSY Left 06/22/2017   benign   LOOP RECORDER INSERTION N/A 05/16/2022   Procedure: LOOP RECORDER INSERTION;  Surgeon: Lanier Prude, MD;  Location: [facility];  Service: Cardiovascular;  Laterality: N/A;   PERCUTANEOUS PINNING Right 06/22/2022   Procedure: PERCUTANEOUS PINNING OF RIGHT HIP;  Surgeon: Bjorn Pippin, MD;  Location: [facility];  Service: Orthopedics;  Laterality: Right;   TONSILLECTOMY     age 38   Patient Active Problem List   Diagnosis Date Noted   Right spastic hemiplegia (HCC) 12/14/2022   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   CAD in native artery 10/31/2022   Malnutrition of moderate degree 06/22/2022   Closed fracture of femur, neck (HCC) 06/21/2022   Urinary frequency 06/21/2022   Hypokalemia 06/21/2022   Heart  murmur 06/21/2022   Hypothyroidism 06/21/2022   Dysphagia 05/17/2022   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 05/11/2020   PVC (premature ventricular contraction) 05/11/2020   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   Carotid bruit 11/13/2019   Pure hypercholesterolemia 11/13/2019   Malignant neoplasm of upper-outer quadrant of right breast in female, estrogen receptor positive (HCC) 05/22/2017   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    Diplopia 09/17/2014   Dizziness and giddiness 09/17/2014   Essential hypertension 07/22/2014   Chest pain 07/22/2014   Family history of heart disease 07/22/2014    REFERRING DIAG:  R29.6 (ICD-10-CM) - Repeated falls, closed fracture of Rt hip    THERAPY DIAG:  Muscle weakness (generalized)  Unsteadiness on feet  Abnormal posture  Rationale for Evaluation and Treatment Rehabilitation  PERTINENT HISTORY: hypertension, breast cancer, hyperlipidemia, Lt hip fracture secondary to fall 06/2022, CVA 06/2022   PRECAUTIONS:  Fall, Rt breast cancer with node removal, osteoporosis    SUBJECTIVE:                                                                                                                                                                                      SUBJECTIVE STATEMENT:  Its very hot today, I'm fine other. No falls or close calls. I'm just happy to be making progress. I was able to walk all the way to the end of the driveway on Monday, that's a new record for me!   Per husband: especially walking is getting better    PAIN:  Are you having pain?0/10   OBJECTIVE: (objective measures completed at initial evaluation unless otherwise dated)  DIAGNOSTIC FINDINGS: Rt hip fracture and ORIF     COGNITION: Overall cognitive status: Within functional limits for tasks assessed                         SENSATION: WFL  POSTURE: rounded shoulders, forward head, flexed trunk , and weight shift left   PALPATION: NA   LOWER EXTREMITY ROM: Rt hip limited by 50%, hamstrings limited by 50% bil.    LOWER EXTREMITY MMT:   MMT Right eval Right  09/27/22 Left eval Right 10/30/22 Right  01/17/23 Left 10/30/22 Left 01/17/23  Hip flexion 3+ 4- 4- 4- 4 4- 4+  Hip extension           Hip abduction 3-  4    4  Hip adduction  Hip internal rotation           Hip external rotation           Knee flexion 3- 4- 3+  4  4+  Knee extension 3+ 4 4- 4 painful hip 4+ 4-  4+  Ankle dorsiflexion 3+ 4- 4- 4-  4   Ankle plantarflexion           Ankle inversion           Ankle eversion            (Blank rows = not tested)   FUNCTIONAL TESTS:   Eval: Timed up and go (TUG): 1 min, 27 seconds   09/27/22: 5x sit to stand: 29 seconds with use of Lt hand  TUG: 27 seconds   10/30/22: 5x sit to stand: 23 sec use of LT hand     TUG: 26 sec with standard cane   11/20/22: 5x sit to stand: 18.68 seconds    TUG: 19.45 without device    3 minute walk test: 225 feet with a standard cane   01/10/23: 5x sit to stand: 15.77 seconds   01/17/23: TUG with cane: 15.41 seconds with cane    3 min walk test: 313 feet with cane    03/07/23: 3 min walk 275 feet with single point cane   TUG: 16 seconds   03/14/23: 6 min walk test: 723 feet with standard cane    TUG: 14.16 without cane    5x sit to stand: 12.15 seconds without hand   03/28/23 TUG 11 seconds without cane  5x STS 10.6 seconds no UEs      TODAY'S TREATMENT:    03/28/23  TUG and 5x STS (see above) for progress note- along with education on POC moving forward, overall objective progress with PT   TherEx  Nustep L4x6 minutes BLEs only Forward step ups 4 inch box 2x5 B  Lateral step ups 4 inch box 2x5 B Farmers carry 3# L UE from gym mat table to mirror 2 laps     NMR  Tandem stance on pink foam pad 2x20 seconds B at best (MaxA required for balance recovery due to posterior lean) Narrow BOS on pink foam pad 3x30 up to MinA    03/14/23: Nustep L4 x 8 min with PT present to monitor and discuss progress - legs only- instructed pt to keep SPM >40-50 Leg press: seat 5, 45#  bil legs 2x10, Lt only 30#, Rt only 20# 2x10 bil each D2 with 2# weight in Lt UE in standing to promote rotation x10 Sit to stand: 2x10 holding 5# kettlebell on Lt Gait with standard cane around clinic- 1 full lap- no gait belt needed, CGA by PT for safety Obstacle course: stepping over hurdles, on 2" step and balance pad- CGA on gait  belt with 1 loss of balance   03/07/23: Nustep L4 x 8 min with PT present to monitor and discuss progress - legs only- instructed pt to keep SPM >40-50 3 min walk test and TUG Leg press: seat 5, 45#  bil legs 2x10, Lt only 30#, Rt only 20# 2x10 bil each Gait around building with standard cane: CGA on gait belt by PT.  Full lap around clinic    D2 with 2# weight in Lt UE in standing to promote rotation x10 Sit to stand: 2x10 holding 5# kettlebell on Lt Obstacle course: stepping over hurdles, on 2" step and balance pad- CGA on gait belt with  1 loss of balance   02/28/23: Nustep L4 x 8 min with PT present to monitor and discuss progress - legs only- instructed pt to keep SPM >40-50 Leg press: seat 5, 45#  bil legs 2x10, Lt only 30#, Rt only 20# 2x10 bil each Gait around building with standard cane: CGA on gait belt by PT.  Full lap around clinic    D2 with 2# weight in Lt UE in standing to promote rotation x10 Sit to stand: 2x10 holding 5# kettlebell on Lt Obstacle course: stepping over hurdles, on 2" step and balance pad- CGA on gait belt with 1 loss of balance     02/21/23: Nustep L4 x 8 min with PT present to monitor and discuss progress - legs only- instructed pt to keep SPM >40-50 Leg press: seat 5, 45#  bil legs 2x10, Lt only 35#, Rt only 25# 2x10 bil each Gait with weight on 1 arm for instability on Lt up and down hallway with 1 seated rest break.  5#- PT present for guarding.  D2 with 2# weight in Lt UE in standing to promote rotation x10 Sit to stand: 2x10 holding 5# kettlebell on Lt Weaving in/out of cones on the floor without device- CGA by PT on gait belt Negotiating hurdles in // bars without UE support    PATIENT EDUCATION:  Education details: Access Code: [email or web address] Person educated: Patient Education method: Explanation, Demonstration, and Handouts Education comprehension: verbalized understanding and returned demonstration   HOME EXERCISE PROGRAM: Access Code:  [email or web address] URL: [email or web address] Date: 09/27/2022 Prepared by: Tresa Endo  Exercises - Seated Long Arc Quad  - 3 x daily - 7 x weekly - 2 sets - 10 reps - 5 hold - Seated March   - 3 x daily - 7 x weekly - 3 sets - 10 reps - Seated Heel Toe Raises   - 3 x daily - 7 x weekly - 2 sets - 10 reps - Seated Isometric Hip Adduction with Ball  - 3 x daily - 7 x weekly - 2 sets - 10 reps - Sit to Stand with Armchair  - 2 x daily - 7 x weekly - 2 sets - 5-10 reps - Standing Hip Abduction with Counter Support  - 1 x daily - 7 x weekly - 2 sets - 10 reps - Heel Raises with Counter Support  - 2 x daily - 7 x weekly - 2 sets - 10 reps  Patient Education - Walking with a Single [employer] - Modified 4 Point Gait Pattern  ASSESSMENT:   CLINICAL IMPRESSION:      Ms. Mellen arrives today doing well. Spent some time taking physical measures for progress note (TUG and 5x STS)- both improved, she also relays improved subjective function especially in walking tolerance and endurance. Otherwise continued focus on functional strengthening and balance training as per POC.Will continue therapy as per pre-established POC- she is really making good progress.   OBJECTIVE IMPAIRMENTS: Abnormal gait, decreased activity tolerance, decreased balance, decreased mobility, difficulty walking, decreased strength, decreased safety awareness, impaired perceived functional ability, impaired flexibility, postural dysfunction, and pain.    ACTIVITY LIMITATIONS: carrying, lifting, sitting, standing, stairs, transfers, hygiene/grooming, and locomotion level   PARTICIPATION LIMITATIONS: meal prep, cleaning, laundry, driving, shopping, and community activity   PERSONAL FACTORS: Age, Past/current experiences, and 1-2 comorbidities: CVA, falls, Rt hip fracture with ORIF  are also affecting patient's functional outcome.    REHAB POTENTIAL: Good   CLINICAL DECISION MAKING: Evolving/moderate complexity  EVALUATION  COMPLEXITY: Moderate     GOALS: Goals reviewed with patient? Yes   SHORT TERM GOALS: Target date: 09/06/2022   Be independent in initial HEP Baseline: Goal status: Goal met 08/14/22   2.  Improve LE strength to perform sit to stand with moderate Lt UE support Baseline:  Goal status: Goal met 08/28/22  3.  Perform TUG in < or = to 60 seconds to reduce falls risk Baseline: 27 seconds (09/27/22) Goal status: MET      LONG TERM GOALS: Target date:  05/28/23   Be independent in advanced HEP Baseline: independent in current HEP and further progress is needed as she becomes more stable (03/14/23)  Goal status: in progress    2.  Perform TUG in < or = to 13 seconds to reduce falls risk Baseline: 14.16 seconds without cane (03/14/23) Goal status: IN PROGRESS   3.  ambulate > or = to 875 feet in 6 minutes to improve community ambulation and independence  Baseline: 723 feet (03/14/23) Goal status: REVISED    4.  Ambulate with hemi walker with supervision or no guard due to improved gait Baseline: able to do this Goal status: MET   5.  perform 5x sit to stand in < or = to 12 seconds to reduce falls risk  Baseline: 12.15 seconds (03/14/23)  Goal Status: In progress   6. Ambulate without device for home distances 100% of the time due to improved stability and reduced falls risk  Baseline: using cane 40% when fatigued (03/14/23)  Goal status: in progress   7. Improve balance to walk her dog short distances with independence   Baseline: not safe to walk independently with her dog (03/14/23)  Goal status: NEW     PLAN:   PT FREQUENCY: 1x/week   PT DURATION: 10 weeks   PLANNED INTERVENTIONS: Therapeutic exercises, Therapeutic activity, Neuromuscular re-education, Balance training, Gait training, Patient/Family education, Self Care, Joint mobilization, Stair training, Dry Needling, Electrical stimulation, Cryotherapy, Moist heat, Taping, Manual therapy, and Re-evaluation   PLAN FOR  NEXT SESSION: Continue to work on gait , strength and balance. Increase focus on balance on foam/unsteady surfaces    Nedra Hai, PT, DPT 03/28/23 2:41 PM

## 2023-03-30 ENCOUNTER — Ambulatory Visit (INDEPENDENT_AMBULATORY_CARE_PROVIDER_SITE_OTHER): Payer: Medicare PPO

## 2023-03-30 ENCOUNTER — Telehealth: Payer: Self-pay | Admitting: Cardiology

## 2023-03-30 DIAGNOSIS — Z8673 Personal history of transient ischemic attack (TIA), and cerebral infarction without residual deficits: Secondary | ICD-10-CM

## 2023-03-30 LAB — CUP PACEART REMOTE DEVICE CHECK
Date Time Interrogation Session: 20240628020900
Implantable Pulse Generator Implant Date: 20230815
Pulse Gen Serial Number: 183424

## 2023-03-30 NOTE — Telephone Encounter (Signed)
°  1. Has your device fired? no ° °2. Is you device beeping? no ° °3. Are you experiencing draining or swelling at device site?no ° °4. Are you calling to see if we received your device transmission?yes ° °5. Have you passed out? no ° ° ° °Please route to Device Clinic Pool °

## 2023-03-30 NOTE — Telephone Encounter (Signed)
ILR summary report received. Battery status OK. Normal device function. No new symptom, tachy, brady, or pause episodes. No new AF episodes. Monthly summary reports and ROV/PRN LA, CVRS    Spoke w/ pt. Updated her that everything was WNL on device report.

## 2023-04-04 ENCOUNTER — Encounter (HOSPITAL_BASED_OUTPATIENT_CLINIC_OR_DEPARTMENT_OTHER): Payer: Self-pay | Admitting: Family

## 2023-04-04 ENCOUNTER — Encounter: Payer: Self-pay | Admitting: Rehabilitative and Restorative Service Providers"

## 2023-04-04 ENCOUNTER — Encounter (HOSPITAL_BASED_OUTPATIENT_CLINIC_OR_DEPARTMENT_OTHER): Payer: Self-pay

## 2023-04-04 ENCOUNTER — Ambulatory Visit (HOSPITAL_BASED_OUTPATIENT_CLINIC_OR_DEPARTMENT_OTHER): Payer: Medicare PPO | Admitting: Family

## 2023-04-04 ENCOUNTER — Ambulatory Visit: Payer: Medicare PPO | Attending: Family Medicine | Admitting: Rehabilitative and Restorative Service Providers"

## 2023-04-04 VITALS — BP 124/60 | HR 54 | Ht 62.0 in | Wt 101.0 lb

## 2023-04-04 DIAGNOSIS — R072 Precordial pain: Secondary | ICD-10-CM | POA: Diagnosis not present

## 2023-04-04 DIAGNOSIS — Z8673 Personal history of transient ischemic attack (TIA), and cerebral infarction without residual deficits: Secondary | ICD-10-CM | POA: Diagnosis not present

## 2023-04-04 DIAGNOSIS — I491 Atrial premature depolarization: Secondary | ICD-10-CM

## 2023-04-04 DIAGNOSIS — E876 Hypokalemia: Secondary | ICD-10-CM

## 2023-04-04 DIAGNOSIS — I25118 Atherosclerotic heart disease of native coronary artery with other forms of angina pectoris: Secondary | ICD-10-CM

## 2023-04-04 DIAGNOSIS — E785 Hyperlipidemia, unspecified: Secondary | ICD-10-CM | POA: Diagnosis not present

## 2023-04-04 DIAGNOSIS — R2689 Other abnormalities of gait and mobility: Secondary | ICD-10-CM | POA: Insufficient documentation

## 2023-04-04 DIAGNOSIS — I10 Essential (primary) hypertension: Secondary | ICD-10-CM

## 2023-04-04 DIAGNOSIS — R2681 Unsteadiness on feet: Secondary | ICD-10-CM | POA: Insufficient documentation

## 2023-04-04 DIAGNOSIS — R293 Abnormal posture: Secondary | ICD-10-CM | POA: Insufficient documentation

## 2023-04-04 DIAGNOSIS — M6281 Muscle weakness (generalized): Secondary | ICD-10-CM | POA: Insufficient documentation

## 2023-04-04 MED ORDER — ISOSORBIDE MONONITRATE ER 30 MG PO TB24
15.0000 mg | ORAL_TABLET | Freq: Every day | ORAL | 3 refills | Status: DC
Start: 2023-04-04 — End: 2023-08-27

## 2023-04-04 NOTE — Progress Notes (Signed)
Cardiology Office Note:  .   Date:  04/04/2023  ID:  SARYNITY BURGESON, DOB 1951/05/14, MRN [id number] PCP: Shon Hale, MD  [facility] Providers Cardiologist:  Chilton Si, MD    History of Present Illness: .   DEVIDA FOTI is a 72 y.o. female with a hx of celiac, breast cancer s/p lumpectomy and XRT, R subclavian DVT, HTN, CVA, palpitations, PAC/PVC, HLD, CAD last seen 12/07/22 by pharmacy team.   Prior monitor 2017 for palpitations revealed frequent PVCs. Myoview 2015 with no ischemia. Seen 11/2019 for palpitations and CV risk assessment. Lisinopril increased for hypertension and Rosuvastatin initiated. She developed myalgias and reduced to every other day. Repeat 30 day monitor with some PAC and PVC which were treated by Metoprolol. Echo 12/2019 normal LVEF, gr1DD and normal carotid dopplers. Palpitations much improved on Metoprolol. Due to need for BP control, pharmacy team increased HCTZ and reduced Metoprolol. HCTZ then transitioned to Amlodipine due to hyponatremia. Myoview 10/2020 with LVEF 70%, fixed perfusion defect at apex, apical anterior, lateral and inferior walls to felt to be artifact with no ischemia. She had been transitioned from Rosuvastatin to Pravasatatin due to myalgias. Cardiac CTA11/2022 due to reports of chest pain which showed coronary calcium score of 14.4 with minimal nonobstructive coronary disease.      Admitted 05/2022 with cryptogenic stroke.  Echo LVEF 60 to 65%, normal diastolic function.  ILR implanted and no arrhythmias have been detected.   Saw Dr. Duke Salvia 10/31/22 doing overall well aside from residual right-sided hemiparesis and tremors.  Lipid panel updated.  BP elevated and amlodipine increased to 10 mg.  Lisinopril and metoprolol were continued at present dose.   She saw pharmacy team 12/07/2022.  LDL not at goal of less than 70.  Pravastatin continued and Zetia 10 mg daily.  Last seen 01/09/2023 doing well from cardiac perspective with no  changes made.   She saw primary care 03/27/2023 after episode the evening prior waking up with shortness of breath feeling panicked.  She also had left-sided stabbing chest pain as well as tightness while laying in bed lasting 1 minute.  She also noted some exertional dyspnea and exercise intolerance.  Labs 625/24 creatinine 0.73, GFR 87, K4.1, hemoglobin 12.1, BNP 78.  EKG performed 03/27/2023 at PCP independently reviewed reveals sinus rhythm 60 bpm with no acute ST/T wave changes. There is some artifact.   Presents today for follow up with her husband. 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 She reports feeling about the same since she last saw Dr. Chanetta Marshall. She notes both chest pain and dyspnea. Her husband notes that her shortness of breath is more frequent. Shortness of breath occurs both at rest and with activity. Notes no orthopnea but does have occasional PNd. She notes swelling in her right hand finger and her knuckles will turn purple. Also swelling in her right foot. Of note, her right side was affected by her stroke. Swelling is worse by end of day. Her PT encouraged her to massage her right hand. She does wear brace on right hand. Swelling is occurring daily. She reports she is having episodes of left sided chest pain which she describes as "stabbing" and is intermittent. Chest pain and dyspnea often occur concurrently. Describes as "elephant" stepped on her.  No wheeze, cough. Reports some episodes of of dizziness intermittently. No near syncope, syncope.   ROS: Please see the history of present illness.    All other systems reviewed and are negative.  Studies Reviewed: .        Cardiac Studies & Procedures     STRESS TESTS  MYOCARDIAL PERFUSION IMAGING 10/05/2020  Narrative  There was no significant ST segment deviation noted during stress. <83mm ST depression in V3/4  The left ventricular ejection fraction is hyperdynamic (>65%).  Nuclear stress EF: 70%.  Defect 1: There  is a small defect of moderate severity present in the apical anterior, apical inferior, apical lateral and apex location.  The study is normal.  This is a low risk study.  1. Moderate fixed perfusion defect at apex and apical anterior/lateral/inferior walls, with normal wall motion in this area, suggestive of artifact 2. Low risk study   ECHOCARDIOGRAM  ECHOCARDIOGRAM COMPLETE 05/13/2022  Narrative ECHOCARDIOGRAM REPORT    Patient Name:   NARITA URBACH Date of Exam: 05/13/2022 Medical Rec #:  [id number]     Height:       62.0 in Accession #:    [id number]    Weight:       120.8 lb Date of Birth:  [birth year]     BSA:          1.543 m Patient Age:    70 years      BP:           133/75 mmHg Patient Gender: F             HR:           69 bpm. Exam Location:  Inpatient  Procedure: 2D Echo, Cardiac Doppler and Color Doppler  Indications:    Stroke  History:        Patient has no prior history of Echocardiogram examinations.  Sonographer:    Roosvelt Maser RDCS Referring Phys: [id number] ERIC LINDZEN  IMPRESSIONS   1. Left ventricular ejection fraction, by estimation, is 60 to 65%. The left ventricle has normal function. The left ventricle has no regional wall motion abnormalities. Left ventricular diastolic parameters were normal. 2. Right ventricular systolic function is normal. The right ventricular size is normal. Tricuspid regurgitation signal is inadequate for assessing PA pressure. 3. The mitral valve is grossly normal. Trivial mitral valve regurgitation. No evidence of mitral stenosis. 4. The aortic valve is tricuspid. Aortic valve regurgitation is not visualized. No aortic stenosis is present. 5. The inferior vena cava is normal in size with greater than 50% respiratory variability, suggesting right atrial pressure of 3 mmHg.  Conclusion(s)/Recommendation(s): No intracardiac source of embolism detected on this transthoracic study. Consider a transesophageal echocardiogram to exclude  cardiac source of embolism if clinically indicated.  FINDINGS Left Ventricle: Left ventricular ejection fraction, by estimation, is 60 to 65%. The left ventricle has normal function. The left ventricle has no regional wall motion abnormalities. The left ventricular internal cavity size was normal in size. There is no left ventricular hypertrophy. Left ventricular diastolic parameters were normal.  Right Ventricle: The right ventricular size is normal. No increase in right ventricular wall thickness. Right ventricular systolic function is normal. Tricuspid regurgitation signal is inadequate for assessing PA pressure.  Left Atrium: Left atrial size was normal in size.  Right Atrium: Right atrial size was normal in size.  Pericardium: There is no evidence of pericardial effusion.  Mitral Valve: The mitral valve is grossly normal. Trivial mitral valve regurgitation. No evidence of mitral valve stenosis.  Tricuspid Valve: The tricuspid valve is grossly normal. Tricuspid valve regurgitation is not demonstrated. No evidence of tricuspid stenosis.  Aortic Valve: The aortic valve is tricuspid.  Aortic valve regurgitation is not visualized. No aortic stenosis is present.  Pulmonic Valve: The pulmonic valve was grossly normal. Pulmonic valve regurgitation is not visualized. No evidence of pulmonic stenosis.  Aorta: The aortic root and ascending aorta are structurally normal, with no evidence of dilitation.  Venous: The inferior vena cava is normal in size with greater than 50% respiratory variability, suggesting right atrial pressure of 3 mmHg.  IAS/Shunts: The atrial septum is grossly normal.   LEFT VENTRICLE PLAX 2D LVIDd:         3.60 cm   Diastology LVIDs:         2.60 cm   LV e' medial:    10.30 cm/s LV PW:         0.80 cm   LV E/e' medial:  6.4 LV IVS:        0.60 cm   LV e' lateral:   8.05 cm/s LVOT diam:     1.80 cm   LV E/e' lateral: 8.2 LV SV:         56 LV SV Index:   36 LVOT  Area:     2.54 cm   RIGHT VENTRICLE RV Basal diam:  2.60 cm  LEFT ATRIUM             Index        RIGHT ATRIUM           Index LA diam:        2.60 cm 1.69 cm/m   RA Area:     12.60 cm LA Vol (A2C):   50.2 ml 32.53 ml/m  RA Volume:   27.80 ml  18.02 ml/m LA Vol (A4C):   33.5 ml 21.71 ml/m LA Biplane Vol: 40.9 ml 26.51 ml/m AORTIC VALVE LVOT Vmax:   108.00 cm/s LVOT Vmean:  67.200 cm/s LVOT VTI:    0.221 m  AORTA Ao Root diam: 2.90 cm Ao Asc diam:  2.80 cm  MITRAL VALVE MV Area (PHT): 3.91 cm    SHUNTS MV Decel Time: 194 msec    Systemic VTI:  0.22 m MV E velocity: 65.70 cm/s  Systemic Diam: 1.80 cm MV A velocity: 98.50 cm/s MV E/A ratio:  0.67  Lennie Odor MD Electronically signed by Lennie Odor MD Signature Date/Time: 05/13/2022/6:21:45 PM    Final    MONITORS  CARDIAC EVENT MONITOR 01/11/2020  Narrative 30 Day Event Monitor  Quality: Fair.  Baseline artifact. Predominant rhythm: sinus rhythm Average heart rate: 69 bpm Max heart rate: 131 bpm Min heart rate: 47 bpm Pauses >2.5 seconds: none  Patient did submit a symptom diary.  She reported multiple episodes of chest pain and fluttering.  At which time most of the time sinus rhythm with noted.  She did have occasional PACs or PVCs. 4 beats of atrial tachycardia. Occasional PACs and PVCs noted.  Tiffany C. Duke Salvia, MD, [facility] 01/11/2020 4:24 PM   CT SCANS  CT CORONARY MORPH W/CTA COR W/SCORE 08/04/2021  Addendum 08/04/2021  8:31 AM ADDENDUM REPORT: 08/04/2021 08:28  EXAM: OVER-READ INTERPRETATION  CT CHEST  The following report is an over-read performed by radiologist Dr. Deberah Pelton [facility] on 08/03/2021. This over-read does not include interpretation of cardiac or coronary anatomy or pathology. The coronary calcium score/coronary CTA interpretation by the cardiologist is attached.  COMPARISON:  None.  FINDINGS: No suspicious nodules, masses, or infiltrates are  identified in the visualized portion of the lungs. No pleural fluid seen.  The visualized portions of  the mediastinum and chest wall are unremarkable.  IMPRESSION: No significant non-cardiac abnormality in visualized portion of the thorax.   Electronically Signed By: Danae Orleans M.D. On: 08/04/2021 08:28  Narrative CLINICAL DATA:  73 yo female with chest pain  EXAM: Cardiac/Coronary CTA  TECHNIQUE: A non-contrast, gated CT scan was obtained with axial slices of 3 mm through the heart for calcium scoring. Calcium scoring was performed using the Agatston method. A 120 kV prospective, gated, contrast cardiac scan was obtained. Gantry rotation speed was 250 msecs and collimation was 0.6 mm. Two sublingual nitroglycerin tablets (0.8 mg) were given. The 3D data set was reconstructed in 5% intervals of the 35-75% of the R-R cycle. Diastolic phases were analyzed on a dedicated workstation using MPR, MIP, and VRT modes. The patient received 95 cc of contrast.  FINDINGS: Image quality: Average.  Noise artifact is: Limited.  Coronary Arteries:  Normal coronary origin.  Right dominance.  Left main: The left main is a large caliber vessel with a normal take off from the left coronary cusp that bifurcates to form a left anterior descending artery. There is no plaque or stenosis.  Left anterior descending artery: The LAD is patent without evidence of plaque or stenosis. The LAD gives off branching D1, smaller D2 and very smalll D3.  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  Right coronary artery: The RCA is dominant with normal take off from the right coronary cusp. There is no evidence of plaque or stenosis. The RCA gives off PDA, 2 posterolateral branches and then terminates in the left circumflex territory. There is minimal (0-24) calcified plaque in the PDA.  Right Atrium: Right atrial size is within normal limits.  Right Ventricle: The right  ventricular cavity is within normal limits.  Left Atrium: Left atrial size is normal in size with no left atrial appendage filling defect.  Left Ventricle: The ventricular cavity size is within normal limits. There are no stigmata of prior infarction. There is no abnormal filling defect.  Pulmonary arteries: Normal in size.  Pulmonary veins: Normal pulmonary venous drainage.  Pericardium: Normal thickness with no significant effusion or calcium present.  Cardiac valves: The aortic valve is trileaflet without significant calcification. The mitral valve is normal structure without significant calcification.  Aorta: Normal caliber with mild atherosclerosis.  Extra-cardiac findings: See attached radiology report for non-cardiac structures.  IMPRESSION: 1. Coronary calcium score of 14.4. This was 11 percentile for age-, sex, and race-matched controls.  2. Normal coronary origin with right dominance; congenitally absent left circumflex; RCA gives off PDA and 2 posterolateral branches and terminates in the left circumflex distribution.  3. Minimal plaque in the PDA.  4. Mild aortic atherosclerosis.  RECOMMENDATIONS: CAD-RADS 1: Minimal non-obstructive CAD (0-24%). Consider non-atherosclerotic causes of chest pain. Consider preventive therapy and risk factor modification.  Olga Millers, MD  interpretation by the cardiologist is attached.  Electronically Signed: By: Olga Millers M.D. On: 08/02/2021 15:54          Risk Assessment/Calculations:             Physical Exam:   VS:  BP 124/60   Pulse (!) 54   Ht 5\' 2"  (1.575 m)   Wt 101 lb (45.8 kg)   LMP  (LMP Unknown)   BMI 18.47 kg/m    Wt Readings from Last 3 Encounters:  04/04/23 101 lb (45.8 kg)  02/22/23 101 lb (45.8 kg)  01/29/23 101 lb (45.8 kg)  GEN: Well nourished, well developed in no acute distress NECK: No JVD; No carotid bruits CARDIAC: RRR, no murmurs, rubs, gallops RESPIRATORY:  Clear to  auscultation without rales, wheezing or rhonchi  ABDOMEN: Soft, non-tender, non-distended EXTREMITIES:  No edema; No deformity   ASSESSMENT AND PLAN: .    Chest pain / Exertional dyspnea -  2 week history of chest pain described as "sharp" at rest or with activity and exertional dyspnea. BMP/BNP 03/27/23 at PCP were unremarkable. EKG at PCP 03/27/23 NSR 60 bpm with no acute ST/T wave changes but there was some artifact. Chest discomfort with mixed typical and atypical features as occurs at rest or with activity. Prior coronary 08/2021 with 0-25% stenosis of PDA and congenitally absent LCx. Plan to update cardiac CTA to assess for arrhythmia. Add Imdur 15mg  at bedtime for antianginal benefit. If cardiac CTA unremarkable and dyspnea persistent, consider echocardiogram.   HLD, LDL goal <70 - 03/07/23 total cholesterol 166, triglycerides 103, HDL 84, LDL 64. Continue Pravastatin, Zetia.   HTN - BP well controlled. Continue current antihypertensive regimen.  Heart healthy diet and regular cardiovascular exercise encouraged.     History of CVA- ILR with no arrhythmias. Continue lipid control with Pravastatin, Zetia. Continue optimal BP control with goal <130/80.    PAC - Well controlled on Metoprolol.    Hypokalemia - Maintained on potassium supplement. 03/27/23 K 4.1.    CAD- cardiac CTA 08/2021 with coronary calcium score 14.4 which was 47th percentile with minimal calcified plaque in the PDA. 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 Workup of chest pain, dyspnea as above.        Dispo: follow up in one month as scheduled with Dr. Duke Salvia or APP  Signed, Alver Sorrow, NP

## 2023-04-04 NOTE — Patient Instructions (Signed)
Medication Instructions:  Your physician has recommended you make the following change in your medication:   Start: Imdur 15mg  (1/2 tablet) at bedtime   Testing/Procedures:   Your cardiac CT will be scheduled at one of the below locations:   [facility] [address] [place], [place] [address] [phone]  If scheduled at [facility], please arrive at the [facility] and Children's Entrance (Entrance C2) of [facility] 30 minutes prior to test start time. You can use the FREE valet parking offered at entrance C (encouraged to control the heart rate for the test)  Proceed to the [facility] Radiology Department (first floor) to check-in and test prep.  All radiology patients and guests should use entrance C2 at [facility], accessed from [facility], even though the hospital's physical address listed is [address]    Please follow these instructions carefully (unless otherwise directed):  An IV will be required for this test and Nitroglycerin will be given.   On the Night Before the Test: Be sure to Drink plenty of water. Do not consume any caffeinated/decaffeinated beverages or chocolate 12 hours prior to your test. Do not take any antihistamines 12 hours prior to your test.  On the Day of the Test: Drink plenty of water until 1 hour prior to the test. Do not eat any food 1 hour prior to test. You may take your regular medications prior to the test.  FEMALES- please wear underwire-free bra if available, avoid dresses & tight clothing      After the Test: Drink plenty of water. After receiving IV contrast, you may experience a mild flushed feeling. This is normal. On occasion, you may experience a mild rash up to 24 hours after the test. This is not dangerous. If this occurs, you can take Benadryl 25 mg and increase your fluid intake. If you experience trouble breathing, this can be serious. If it is severe call 911  IMMEDIATELY. If it is mild, please call our office. If you take any of these medications: Glipizide/Metformin, Avandament, Glucavance, please do not take 48 hours after completing test unless otherwise instructed.  We will call to schedule your test 2-4 weeks out understanding that some insurance companies will need an authorization prior to the service being performed.   For more information and frequently asked questions, please visit our website : [email or web address]  For non-scheduling related questions, please contact the cardiac imaging nurse navigator should you have any questions/concerns: Rockwell Alexandria, Cardiac Imaging Nurse Navigator Larey Brick, Cardiac Imaging Nurse Navigator [facility] Direct Office Dial: [phone]   For scheduling needs, including cancellations and rescheduling, please call [place], [phone].  Follow-Up: At [facility], you and your health needs are our priority.  As part of our continuing mission to provide you with exceptional heart care, we have created designated Provider Care Teams.  These Care Teams include your primary Cardiologist (physician) and Advanced Practice Providers (APPs -  Physician Assistants and Nurse Practitioners) who all work together to provide you with the care you need, when you need it.  We recommend signing up for the patient portal called "MyChart".  Sign up information is provided on this After Visit Summary.  MyChart is used to connect with patients for Virtual Visits (Telemedicine).  Patients are able to view lab/test results, encounter notes, upcoming appointments, etc.  Non-urgent messages can be sent to your provider as well.   To learn more about  what you can do with MyChart, go to [email or web address].    Your next appointment:   Follow up as scheduled   Other Instructions To prevent or reduce lower extremity swelling: Eat a low salt diet. Salt makes the body  hold onto extra fluid which causes swelling. Sit with legs elevated. For example, in the recliner or on an ottoman.  Wear knee-high compression stockings during the daytime. Ones labeled 15-20 mmHg provide good compression.

## 2023-04-04 NOTE — Therapy (Signed)
OUTPATIENT PHYSICAL THERAPY TREATMENT NOTE   Patient Name: Angela Buchanan MRN: [id number] DOB:1950-11-05, 72 y.o., female Today's Date: 04/04/2023  PCP:  Garth Bigness, MD   REFERRING PROVIDER:  Garth Bigness, MD      END OF SESSION:   PT End of Session - 04/04/23 1446     Visit Number 31    Date for PT Re-Evaluation 05/28/23    Authorization Type [employer]    Authorization Time Period 03/19/2023 - 05/28/2023    Authorization - Visit Number 3    Authorization - Number of Visits 10    PT Start Time 1443    PT Stop Time 1521    PT Time Calculation (min) 38 min    Activity Tolerance Patient tolerated treatment well    Behavior During Therapy [facility] for tasks assessed/performed               Past Medical History:  Diagnosis Date   Atypical chest pain 07/19/2021   Bigeminy    no current med.   Breast cancer (HCC) 06/2017   right   Bruises easily    CAD in native artery 10/31/2022   Carotid bruit 11/13/2019   Dental crowns present    History of diverticulitis    Hypertension    states under control with med., has been on med. x 5 yr.   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   Past Surgical History:  Procedure Laterality Date   ABDOMINAL HYSTERECTOMY     partial   BREAST LUMPECTOMY Right 06/22/2017   Malignant   BREAST LUMPECTOMY WITH RADIOACTIVE SEED AND SENTINEL LYMPH NODE BIOPSY Right 06/22/2017   Procedure: RIGHT BREAST LUMPECTOMY WITH RADIOACTIVE SEED AND RIGHT AXILLARY DEEP SENTINEL LYMPH NODE BIOPSY WITH BLUE DYE INJECTION;  Surgeon: Claud Kelp, MD;  Location: [facility];  Service: General;  Laterality: Right;   BREAST LUMPECTOMY WITH RADIOACTIVE SEED LOCALIZATION Left  06/22/2017   Procedure: LEFT BREAST LUMPECTOMY WITH RADIOACTIVE SEED LOCALIZATION;  Surgeon: Claud Kelp, MD;  Location: [facility];  Service: General;  Laterality: Left;   CATARACT EXTRACTION W/ INTRAOCULAR LENS  IMPLANT, BILATERAL Bilateral    EXCISION OF BREAST BIOPSY Left 06/22/2017   benign   LOOP RECORDER INSERTION N/A 05/16/2022   Procedure: LOOP RECORDER INSERTION;  Surgeon: Lanier Prude, MD;  Location: [facility];  Service: Cardiovascular;  Laterality: N/A;   PERCUTANEOUS PINNING Right 06/22/2022   Procedure: PERCUTANEOUS PINNING OF RIGHT HIP;  Surgeon: Bjorn Pippin, MD;  Location: [facility];  Service: Orthopedics;  Laterality: Right;   TONSILLECTOMY     age 46   Patient Active Problem List   Diagnosis Date Noted   Right spastic hemiplegia (HCC) 12/14/2022   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   CAD in native artery 10/31/2022   Malnutrition of moderate degree 06/22/2022   Closed fracture of femur, neck (HCC) 06/21/2022   Urinary frequency 06/21/2022   Hypokalemia 06/21/2022   Heart murmur 06/21/2022   Hypothyroidism 06/21/2022   Dysphagia 05/17/2022   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   CVA (cerebral vascular accident) (HCC) 05/12/2022   Osteoporosis 01/24/2022   Atypical chest pain 07/19/2021   Temporomandibular joint (TMJ) pain 07/16/2020   Referred otalgia of both ears  07/16/2020   PAC (premature atrial contraction) 05/11/2020   PVC (premature ventricular contraction) 05/11/2020   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   Carotid bruit 11/13/2019   Pure hypercholesterolemia 11/13/2019   Malignant neoplasm of upper-outer quadrant of right breast in female, estrogen receptor positive (HCC) 05/22/2017   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   Diplopia 09/17/2014   Dizziness and giddiness 09/17/2014   Essential hypertension 07/22/2014   Chest pain 07/22/2014   Family history of heart disease 07/22/2014    REFERRING DIAG:   R29.6 (ICD-10-CM) - Repeated falls, closed fracture of Rt hip    THERAPY DIAG:  Muscle weakness (generalized)  Unsteadiness on feet  Abnormal posture  Rationale for Evaluation and Treatment Rehabilitation  PERTINENT HISTORY: hypertension, breast cancer, hyperlipidemia, Lt hip fracture secondary to fall 06/2022, CVA 06/2022   PRECAUTIONS:  Fall, Rt breast cancer with node removal, osteoporosis    SUBJECTIVE:                                                                                                                                                                                      SUBJECTIVE STATEMENT:  Pt continues to deny falls.  Does state that she is having some right shoulder pain today.  "Oh, I picked up my dog for the first time"  Pt spouse present during visit.   PAIN:  PAIN:  Are you having pain? Yes NPRS scale: 4/10 Pain location: Right shoulder PAIN TYPE: aching Aggravating factors: use Relieving factors: Medication    OBJECTIVE: (objective measures completed at initial evaluation unless otherwise dated)  DIAGNOSTIC FINDINGS: Rt hip fracture and ORIF     COGNITION: Overall cognitive status: Within functional limits for tasks assessed                         SENSATION: WFL  POSTURE: rounded shoulders, forward head, flexed trunk , and weight shift left   PALPATION: NA   LOWER EXTREMITY ROM: Rt hip limited by 50%, hamstrings limited by 50% bil.    LOWER EXTREMITY MMT:   MMT Right eval Right  09/27/22 Left eval Right 10/30/22 Right  01/17/23 Left 10/30/22 Left 01/17/23  Hip flexion 3+ 4- 4- 4- 4 4- 4+  Hip extension           Hip abduction 3-  4    4  Hip adduction           Hip internal rotation           Hip external rotation           Knee flexion 3- 4- 3+  4  4+  Knee  extension 3+ 4 4- 4 painful hip 4+ 4- 4+  Ankle dorsiflexion 3+ 4- 4- 4-  4   Ankle plantarflexion           Ankle inversion           Ankle eversion            (Blank  rows = not tested)   FUNCTIONAL TESTS:   Eval: Timed up and go (TUG): 1 min, 27 seconds   09/27/22: 5x sit to stand: 29 seconds with use of Lt hand  TUG: 27 seconds   10/30/22: 5x sit to stand: 23 sec use of LT hand     TUG: 26 sec with standard cane   11/20/22: 5x sit to stand: 18.68 seconds    TUG: 19.45 without device    3 minute walk test: 225 feet with a standard cane   01/10/23: 5x sit to stand: 15.77 seconds   01/17/23: TUG with cane: 15.41 seconds with cane    3 min walk test: 313 feet with cane    03/07/23: 3 min walk 275 feet with single point cane   TUG: 16 seconds   03/14/23: 6 min walk test: 723 feet with standard cane    TUG: 14.16 without cane    5x sit to stand: 12.15 seconds without hand   03/28/23 TUG 11 seconds without cane  5x STS 10.6 seconds no UEs      TODAY'S TREATMENT:     DATE:  04/04/2023 Nustep level 4 x7 minutes with LE only. PT present throughout to discuss status FWD and lateral step ups with 4" step with unilateral UE support x10 bilat Standing 2" step with unilateral UE support performing heel tap downs x10 bilat Seated modified dead lift with 6# dumbbell x7 Farmers carry 3# L UE from gym mat table to mirror 2 laps Sit to/from stand holding 3# dumbbell 2x5 Standing rocker board PF/DF x2 min 4 square stepping to dots on floor x6 reps Standing at barre performing hip extension x10 bilat   03/28/23  TUG and 5x STS (see above) for progress note- along with education on POC moving forward, overall objective progress with PT  Nustep L4x6 minutes BLEs only Forward step ups 4 inch box 2x5 B  Lateral step ups 4 inch box 2x5 B Farmers carry 3# L UE from gym mat table to mirror 2 laps  Tandem stance on pink foam pad 2x20 seconds B at best (MaxA required for balance recovery due to posterior lean) Narrow BOS on pink foam pad 3x30 up to MinA    03/14/23: Nustep L4 x 8 min with PT present to monitor and discuss progress - legs only- instructed pt to  keep SPM >40-50 Leg press: seat 5, 45#  bil legs 2x10, Lt only 30#, Rt only 20# 2x10 bil each D2 with 2# weight in Lt UE in standing to promote rotation x10 Sit to stand: 2x10 holding 5# kettlebell on Lt Gait with standard cane around clinic- 1 full lap- no gait belt needed, CGA by PT for safety Obstacle course: stepping over hurdles, on 2" step and balance pad- CGA on gait belt with 1 loss of balance    PATIENT EDUCATION:  Education details: Access Code: [email or web address] Person educated: Patient Education method: Explanation, Demonstration, and Handouts Education comprehension: verbalized understanding and returned demonstration   HOME EXERCISE PROGRAM: Access Code: [email or web address] URL: [email or web address] Date: 09/27/2022 Prepared by: Tresa Endo  Exercises - Seated Long Arc Quad  - 3 x daily -  7 x weekly - 2 sets - 10 reps - 5 hold - Seated March   - 3 x daily - 7 x weekly - 3 sets - 10 reps - Seated Heel Toe Raises   - 3 x daily - 7 x weekly - 2 sets - 10 reps - Seated Isometric Hip Adduction with Ball  - 3 x daily - 7 x weekly - 2 sets - 10 reps - Sit to Stand with Armchair  - 2 x daily - 7 x weekly - 2 sets - 5-10 reps - Standing Hip Abduction with Counter Support  - 1 x daily - 7 x weekly - 2 sets - 10 reps - Heel Raises with Counter Support  - 2 x daily - 7 x weekly - 2 sets - 10 reps  Patient Education - Walking with a Single [employer] - Modified 4 Point Gait Pattern  ASSESSMENT:   CLINICAL IMPRESSION:      Ms. Mongillo presents to skilled PT with some reports of right shoulder pain, but otherwise no complaints.  Patient continues to deny falls, but does report that she notices her right foot dragging more as she fatigues during the day.  Patient able to progress with strengthening and overall activity tolerance, requiring infrequent seated recovery periods.  During 4 square stepping exercise, patient with most difficulty with back step.  Patient continues to progress towards  goal related activities overall.  OBJECTIVE IMPAIRMENTS: Abnormal gait, decreased activity tolerance, decreased balance, decreased mobility, difficulty walking, decreased strength, decreased safety awareness, impaired perceived functional ability, impaired flexibility, postural dysfunction, and pain.    ACTIVITY LIMITATIONS: carrying, lifting, sitting, standing, stairs, transfers, hygiene/grooming, and locomotion level   PARTICIPATION LIMITATIONS: meal prep, cleaning, laundry, driving, shopping, and community activity   PERSONAL FACTORS: Age, Past/current experiences, and 1-2 comorbidities: CVA, falls, Rt hip fracture with ORIF  are also affecting patient's functional outcome.    REHAB POTENTIAL: Good   CLINICAL DECISION MAKING: Evolving/moderate complexity   EVALUATION COMPLEXITY: Moderate     GOALS: Goals reviewed with patient? Yes   SHORT TERM GOALS: Target date: 09/06/2022   Be independent in initial HEP Baseline: Goal status: Goal met 08/14/22   2.  Improve LE strength to perform sit to stand with moderate Lt UE support Baseline:  Goal status: Goal met 08/28/22  3.  Perform TUG in < or = to 60 seconds to reduce falls risk Baseline: 27 seconds (09/27/22) Goal status: MET      LONG TERM GOALS: Target date:  05/28/23   Be independent in advanced HEP Baseline: independent in current HEP and further progress is needed as she becomes more stable (03/14/23)  Goal status: in progress    2.  Perform TUG in < or = to 13 seconds to reduce falls risk Baseline: 14.16 seconds without cane (03/14/23) Goal status: IN PROGRESS   3.  ambulate > or = to 875 feet in 6 minutes to improve community ambulation and independence  Baseline: 723 feet (03/14/23) Goal status: REVISED    4.  Ambulate with hemi walker with supervision or no guard due to improved gait Baseline: able to do this Goal status: MET   5.  perform 5x sit to stand in < or = to 12 seconds to reduce falls  risk  Baseline: 12.15 seconds (03/14/23)  Goal Status: In progress   6. Ambulate without device for home distances 100% of the time due to improved stability and reduced falls risk  Baseline: using cane  40% when fatigued (03/14/23)  Goal status: in progress   7. Improve balance to walk her dog short distances with independence   Baseline: not safe to walk independently with her dog (03/14/23)  Goal status: NEW     PLAN:   PT FREQUENCY: 1x/week   PT DURATION: 10 weeks   PLANNED INTERVENTIONS: Therapeutic exercises, Therapeutic activity, Neuromuscular re-education, Balance training, Gait training, Patient/Family education, Self Care, Joint mobilization, Stair training, Dry Needling, Electrical stimulation, Cryotherapy, Moist heat, Taping, Manual therapy, and Re-evaluation   PLAN FOR NEXT SESSION: Continue to work on gait , strength and balance. Increase focus on balance on foam/unsteady surfaces      Reather Laurence, PT, DPT 04/04/23, 3:25 PM  [facility] [address], [address] [place], [place] [address] Phone # [phone] Fax [phone]

## 2023-04-11 ENCOUNTER — Ambulatory Visit: Payer: Medicare PPO

## 2023-04-11 DIAGNOSIS — M6281 Muscle weakness (generalized): Secondary | ICD-10-CM

## 2023-04-11 DIAGNOSIS — R293 Abnormal posture: Secondary | ICD-10-CM

## 2023-04-11 DIAGNOSIS — R2689 Other abnormalities of gait and mobility: Secondary | ICD-10-CM

## 2023-04-11 DIAGNOSIS — R2681 Unsteadiness on feet: Secondary | ICD-10-CM

## 2023-04-11 NOTE — Therapy (Signed)
OUTPATIENT PHYSICAL THERAPY TREATMENT NOTE   Patient Name: Angela Buchanan MRN: [id number] DOB:28-Jul-1951, 72 y.o., female Today's Date: 04/11/2023  PCP:  Garth Bigness, MD   REFERRING PROVIDER:  Garth Bigness, MD      END OF SESSION:   PT End of Session - 04/11/23 1447     Visit Number 32    Date for PT Re-Evaluation 05/28/23    Authorization Type [employer]    Authorization Time Period 03/19/2023 - 05/28/2023    Authorization - Visit Number 4    Authorization - Number of Visits 10    Progress Note Due on Visit 40    PT Start Time 1400    PT Stop Time 1446    PT Time Calculation (min) 46 min    Activity Tolerance Patient tolerated treatment well    Behavior During Therapy [facility] for tasks assessed/performed                Past Medical History:  Diagnosis Date   Atypical chest pain 07/19/2021   Bigeminy    no current med.   Breast cancer (HCC) 06/2017   right   Bruises easily    CAD in native artery 10/31/2022   Carotid bruit 11/13/2019   Dental crowns present    History of diverticulitis    Hypertension    states under control with med., has been on med. x 5 yr.   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   Past Surgical History:  Procedure Laterality Date   ABDOMINAL HYSTERECTOMY     partial   BREAST LUMPECTOMY Right 06/22/2017   Malignant   BREAST LUMPECTOMY WITH RADIOACTIVE SEED AND SENTINEL LYMPH NODE BIOPSY Right 06/22/2017   Procedure: RIGHT BREAST LUMPECTOMY WITH RADIOACTIVE SEED AND RIGHT AXILLARY DEEP SENTINEL LYMPH NODE BIOPSY WITH BLUE DYE INJECTION;  Surgeon: Claud Kelp, MD;  Location: [facility];  Service: General;  Laterality: Right;   BREAST LUMPECTOMY WITH  RADIOACTIVE SEED LOCALIZATION Left 06/22/2017   Procedure: LEFT BREAST LUMPECTOMY WITH RADIOACTIVE SEED LOCALIZATION;  Surgeon: Claud Kelp, MD;  Location: [facility];  Service: General;  Laterality: Left;   CATARACT EXTRACTION W/ INTRAOCULAR LENS  IMPLANT, BILATERAL Bilateral    EXCISION OF BREAST BIOPSY Left 06/22/2017   benign   LOOP RECORDER INSERTION N/A 05/16/2022   Procedure: LOOP RECORDER INSERTION;  Surgeon: Lanier Prude, MD;  Location: [facility];  Service: Cardiovascular;  Laterality: N/A;   PERCUTANEOUS PINNING Right 06/22/2022   Procedure: PERCUTANEOUS PINNING OF RIGHT HIP;  Surgeon: Bjorn Pippin, MD;  Location: [facility];  Service: Orthopedics;  Laterality: Right;   TONSILLECTOMY     age 70   Patient Active Problem List   Diagnosis Date Noted   Right spastic hemiplegia (HCC) 12/14/2022   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   CAD in native artery 10/31/2022   Malnutrition of moderate degree 06/22/2022   Closed fracture of femur, neck (HCC) 06/21/2022   Urinary frequency 06/21/2022   Hypokalemia 06/21/2022   Heart murmur 06/21/2022   Hypothyroidism 06/21/2022   Dysphagia 05/17/2022   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   CVA (cerebral vascular accident) (HCC) 05/12/2022   Osteoporosis 01/24/2022   Atypical chest pain 07/19/2021   Temporomandibular joint (  TMJ) pain 07/16/2020   Referred otalgia of both ears 07/16/2020   PAC (premature atrial contraction) 05/11/2020   PVC (premature ventricular contraction) 05/11/2020   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   Carotid bruit 11/13/2019   Pure hypercholesterolemia 11/13/2019   Malignant neoplasm of upper-outer quadrant of right breast in female, estrogen receptor positive (HCC) 05/22/2017   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   Diplopia 09/17/2014   Dizziness and giddiness 09/17/2014   Essential hypertension 07/22/2014   Chest pain 07/22/2014   Family history of heart disease  07/22/2014    REFERRING DIAG:  R29.6 (ICD-10-CM) - Repeated falls, closed fracture of Rt hip    THERAPY DIAG:  Muscle weakness (generalized)  Unsteadiness on feet  Abnormal posture  Other abnormalities of gait and mobility  Rationale for Evaluation and Treatment Rehabilitation  PERTINENT HISTORY: hypertension, breast cancer, hyperlipidemia, Lt hip fracture secondary to fall 06/2022, CVA 06/2022   PRECAUTIONS:  Fall, Rt breast cancer with node removal, osteoporosis    SUBJECTIVE:                                                                                                                                                                                      SUBJECTIVE STATEMENT:  I am using my Rt arm a lot more now.  Frustrated that I can't get back to OT.  They told me that I need a new order and I dropped off a form at Dr Christena Flake office 2 weeks ago.    Pt spouse present during visit.   PAIN:  PAIN:  Are you having pain? Yes NPRS scale: 4/10 Pain location: Right shoulder PAIN TYPE: aching Aggravating factors: use Relieving factors: Medication    OBJECTIVE: (objective measures completed at initial evaluation unless otherwise dated)  DIAGNOSTIC FINDINGS: Rt hip fracture and ORIF     COGNITION: Overall cognitive status: Within functional limits for tasks assessed                         SENSATION: WFL  POSTURE: rounded shoulders, forward head, flexed trunk , and weight shift left   PALPATION: NA   LOWER EXTREMITY ROM: Rt hip limited by 50%, hamstrings limited by 50% bil.    LOWER EXTREMITY MMT:   MMT Right eval Right  09/27/22 Left eval Right 10/30/22 Right  01/17/23 Left 10/30/22 Left 01/17/23  Hip flexion 3+ 4- 4- 4- 4 4- 4+  Hip extension           Hip abduction 3-  4    4  Hip adduction           Hip internal rotation  Hip external rotation           Knee flexion 3- 4- 3+  4  4+  Knee extension 3+ 4 4- 4 painful hip 4+ 4- 4+  Ankle  dorsiflexion 3+ 4- 4- 4-  4   Ankle plantarflexion           Ankle inversion           Ankle eversion            (Blank rows = not tested)   FUNCTIONAL TESTS:   Eval: Timed up and go (TUG): 1 min, 27 seconds   09/27/22: 5x sit to stand: 29 seconds with use of Lt hand  TUG: 27 seconds   10/30/22: 5x sit to stand: 23 sec use of LT hand     TUG: 26 sec with standard cane   11/20/22: 5x sit to stand: 18.68 seconds    TUG: 19.45 without device    3 minute walk test: 225 feet with a standard cane   01/10/23: 5x sit to stand: 15.77 seconds   01/17/23: TUG with cane: 15.41 seconds with cane    3 min walk test: 313 feet with cane    03/07/23: 3 min walk 275 feet with single point cane   TUG: 16 seconds   03/14/23: 6 min walk test: 723 feet with standard cane    TUG: 14.16 without cane    5x sit to stand: 12.15 seconds without hand   03/28/23 TUG 11 seconds without cane  5x STS 10.6 seconds no UEs      TODAY'S TREATMENT:    DATE:  04/04/2023 Nustep level 4 x 9 minutes with LE only. PT present throughout to discuss status FWD and lateral step ups with 6" step with unilateral UE support x10 bilat Seated modified dead lift with 6# dumbbell x7 Farmers carry 3# L UE from gym mat table to mirror 2 laps Walk around building with gait belt and cane- SBA by PT for safety Sit to/from stand holding 6# dumbbell 2x10 Standing rocker board PF/DF x2 min 4 square stepping to dots on floor x6 reps Standing at barre performing hip extension x10 bilat  DATE:  04/04/2023 Nustep level 4 x7 minutes with LE only. PT present throughout to discuss status FWD and lateral step ups with 4" step with unilateral UE support x10 bilat Standing 2" step with unilateral UE support performing heel tap downs x10 bilat Seated modified dead lift with 6# dumbbell x7 Farmers carry 3# L UE from gym mat table to mirror 2 laps Sit to/from stand holding 3# dumbbell 2x5 Standing rocker board PF/DF x2 min 4 square stepping to  dots on floor x6 reps Standing at barre performing hip extension x10 bilat   03/28/23  TUG and 5x STS (see above) for progress note- along with education on POC moving forward, overall objective progress with PT  Nustep L4x6 minutes BLEs only Forward step ups 4 inch box 2x5 B  Lateral step ups 4 inch box 2x5 B Farmers carry 3# L UE from gym mat table to mirror 2 laps  Tandem stance on pink foam pad 2x20 seconds B at best (MaxA required for balance recovery due to posterior lean) Narrow BOS on pink foam pad 3x30 up to MinA   PATIENT EDUCATION:  Education details: Access Code: [email or web address] Person educated: Patient Education method: Programmer, multimedia, Facilities manager, and Handouts Education comprehension: verbalized understanding and returned demonstration   HOME EXERCISE PROGRAM: Access Code: [email or web address] URL: [email or web address] Date: 09/27/2022  Prepared by: Tresa Endo  Exercises - Seated Long Arc Quad  - 3 x daily - 7 x weekly - 2 sets - 10 reps - 5 hold - Seated March   - 3 x daily - 7 x weekly - 3 sets - 10 reps - Seated Heel Toe Raises   - 3 x daily - 7 x weekly - 2 sets - 10 reps - Seated Isometric Hip Adduction with Ball  - 3 x daily - 7 x weekly - 2 sets - 10 reps - Sit to Stand with Armchair  - 2 x daily - 7 x weekly - 2 sets - 5-10 reps - Standing Hip Abduction with Counter Support  - 1 x daily - 7 x weekly - 2 sets - 10 reps - Heel Raises with Counter Support  - 2 x daily - 7 x weekly - 2 sets - 10 reps  Patient Education - Walking with a Single [employer] - Modified 4 Point Gait Pattern  ASSESSMENT:   CLINICAL IMPRESSION:      Pt is working on use of her Rt UE and is now able to wash her Lt arm, touch her nose and assist with taking off her glasses.  She is able to do more challenging balance tasks and requires less guarding due to improved balance.  She was able to progress to 6" step and 6# weights with sit to stand.   Patient able to progress with strengthening and  overall activity tolerance, requiring infrequent seated recovery periods.   Patient continues to progress towards goal related activities overall.  OBJECTIVE IMPAIRMENTS: Abnormal gait, decreased activity tolerance, decreased balance, decreased mobility, difficulty walking, decreased strength, decreased safety awareness, impaired perceived functional ability, impaired flexibility, postural dysfunction, and pain.    ACTIVITY LIMITATIONS: carrying, lifting, sitting, standing, stairs, transfers, hygiene/grooming, and locomotion level   PARTICIPATION LIMITATIONS: meal prep, cleaning, laundry, driving, shopping, and community activity   PERSONAL FACTORS: Age, Past/current experiences, and 1-2 comorbidities: CVA, falls, Rt hip fracture with ORIF  are also affecting patient's functional outcome.    REHAB POTENTIAL: Good   CLINICAL DECISION MAKING: Evolving/moderate complexity   EVALUATION COMPLEXITY: Moderate     GOALS: Goals reviewed with patient? Yes   SHORT TERM GOALS: Target date: 09/06/2022   Be independent in initial HEP Baseline: Goal status: Goal met 08/14/22   2.  Improve LE strength to perform sit to stand with moderate Lt UE support Baseline:  Goal status: Goal met 08/28/22  3.  Perform TUG in < or = to 60 seconds to reduce falls risk Baseline: 27 seconds (09/27/22) Goal status: MET      LONG TERM GOALS: Target date:  05/28/23   Be independent in advanced HEP Baseline: independent in current HEP and further progress is needed as she becomes more stable (03/14/23)  Goal status: in progress    2.  Perform TUG in < or = to 13 seconds to reduce falls risk Baseline: 14.16 seconds without cane (03/14/23) Goal status: IN PROGRESS   3.  ambulate > or = to 875 feet in 6 minutes to improve community ambulation and independence  Baseline: 723 feet (03/14/23) Goal status: REVISED    4.  Ambulate with hemi walker with supervision or no guard due to improved gait Baseline: able to  do this Goal status: MET   5.  perform 5x sit to stand in < or = to 12 seconds to reduce falls risk  Baseline: 12.15 seconds (03/14/23)  Goal Status:  In progress   6. Ambulate without device for home distances 100% of the time due to improved stability and reduced falls risk  Baseline: using cane 40% when fatigued (03/14/23)  Goal status: in progress   7. Improve balance to walk her dog short distances with independence   Baseline: not safe to walk independently with her dog (03/14/23)  Goal status: NEW     PLAN:   PT FREQUENCY: 1x/week   PT DURATION: 10 weeks   PLANNED INTERVENTIONS: Therapeutic exercises, Therapeutic activity, Neuromuscular re-education, Balance training, Gait training, Patient/Family education, Self Care, Joint mobilization, Stair training, Dry Needling, Electrical stimulation, Cryotherapy, Moist heat, Taping, Manual therapy, and Re-evaluation   PLAN FOR NEXT SESSION: Continue to work on gait , strength and balance. Increase focus on balance on foam/unsteady surfaces      Lorrene Reid, PT 04/11/23 2:54 PM   [facility] [address], [address] [place], [place] [address] Phone # [phone] Fax [phone]

## 2023-04-12 NOTE — Progress Notes (Signed)
[facility] Summary Report / Loop Recorder 

## 2023-04-16 ENCOUNTER — Ambulatory Visit: Payer: Medicare PPO | Attending: Family Medicine | Admitting: Occupational Therapy

## 2023-04-16 ENCOUNTER — Other Ambulatory Visit: Payer: Self-pay

## 2023-04-16 DIAGNOSIS — R293 Abnormal posture: Secondary | ICD-10-CM | POA: Insufficient documentation

## 2023-04-16 DIAGNOSIS — I69351 Hemiplegia and hemiparesis following cerebral infarction affecting right dominant side: Secondary | ICD-10-CM | POA: Diagnosis not present

## 2023-04-16 DIAGNOSIS — M25611 Stiffness of right shoulder, not elsewhere classified: Secondary | ICD-10-CM | POA: Insufficient documentation

## 2023-04-16 DIAGNOSIS — M6281 Muscle weakness (generalized): Secondary | ICD-10-CM | POA: Insufficient documentation

## 2023-04-16 DIAGNOSIS — R278 Other lack of coordination: Secondary | ICD-10-CM | POA: Diagnosis not present

## 2023-04-16 DIAGNOSIS — M79601 Pain in right arm: Secondary | ICD-10-CM | POA: Diagnosis not present

## 2023-04-16 NOTE — Therapy (Signed)
OUTPATIENT OCCUPATIONAL THERAPY NEURO EVALUATION  Patient Name: Angela Buchanan MRN: [id number] DOB:27-Aug-1951, 72 y.o., female Today's Date: 04/16/2023  PCP: Shon Hale, MD REFERRING PROVIDER: Shon Hale, MD  END OF SESSION:  OT End of Session - 04/16/23 1608     Visit Number 1    Number of Visits 17    Date for OT Re-Evaluation 06/15/23    Authorization Type [employer]    OT Start Time 1402    OT Stop Time 1446    OT Time Calculation (min) 44 min    Behavior During Therapy [facility] for tasks assessed/performed             Past Medical History:  Diagnosis Date   Atypical chest pain 07/19/2021   Bigeminy    no current med.   Breast cancer (HCC) 06/2017   right   Bruises easily    CAD in native artery 10/31/2022   Carotid bruit 11/13/2019   Dental crowns present    History of diverticulitis    Hypertension    states under control with med., has been on med. x 5 yr.   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   Past Surgical History:  Procedure Laterality Date   ABDOMINAL HYSTERECTOMY     partial   BREAST LUMPECTOMY Right 06/22/2017   Malignant   BREAST LUMPECTOMY WITH RADIOACTIVE SEED AND SENTINEL LYMPH NODE BIOPSY Right 06/22/2017   Procedure: RIGHT BREAST LUMPECTOMY WITH RADIOACTIVE SEED AND RIGHT AXILLARY DEEP SENTINEL LYMPH NODE BIOPSY WITH BLUE DYE INJECTION;  Surgeon: Claud Kelp, MD;  Location: [facility];  Service: General;  Laterality: Right;   BREAST LUMPECTOMY WITH RADIOACTIVE SEED LOCALIZATION Left 06/22/2017   Procedure: LEFT BREAST LUMPECTOMY WITH RADIOACTIVE SEED LOCALIZATION;  Surgeon: Claud Kelp, MD;  Location: [facility];  Service: General;   Laterality: Left;   CATARACT EXTRACTION W/ INTRAOCULAR LENS  IMPLANT, BILATERAL Bilateral    EXCISION OF BREAST BIOPSY Left 06/22/2017   benign   LOOP RECORDER INSERTION N/A 05/16/2022   Procedure: LOOP RECORDER INSERTION;  Surgeon: Lanier Prude, MD;  Location: [facility];  Service: Cardiovascular;  Laterality: N/A;   PERCUTANEOUS PINNING Right 06/22/2022   Procedure: PERCUTANEOUS PINNING OF RIGHT HIP;  Surgeon: Bjorn Pippin, MD;  Location: [facility];  Service: Orthopedics;  Laterality: Right;   TONSILLECTOMY     age 85   Patient Active Problem List   Diagnosis Date Noted   Right spastic hemiplegia (HCC) 12/14/2022   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 05/11/2020   PVC (premature ventricular contraction) 05/11/2020   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   Carotid bruit 11/13/2019   Pure hypercholesterolemia 11/13/2019   Malignant neoplasm of upper-outer quadrant of  right breast in female, estrogen receptor positive (HCC) 05/22/2017   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   Diplopia 09/17/2014   Dizziness and giddiness 09/17/2014   Essential hypertension 07/22/2014   Chest pain 07/22/2014   Family history of heart disease 07/22/2014    ONSET DATE: 05/13/22 (new referral 04/09/23)  REFERRING DIAG: G83.23 (ICD-10-CM) - 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  Pt demonstrating improved gross grasp.   Pt accompanied by: self and significant other ([name])  PERTINENT HISTORY: hypertension, breast cancer, hyperlipidemia, Lt hip fracture secondary to fall 06/2022, CVA 05/2022    PRECAUTIONS: Fall, Rt breast cancer with node removal, osteoporosis   WEIGHT BEARING RESTRICTIONS: No  PAIN:  Are you having pain? No  FALLS: Has patient fallen in last 6 months? No  LIVING ENVIRONMENT: Lives with: lives with their spouse Lives in: House/apartment Stairs: Yes: External: 2 steps; on left going up Has following equipment at home: Quad cane large base, Environmental consultant - 2 wheeled, Hemi walker, Wheelchair (manual), shower chair, and Grab bars  PLOF: Independent and Independent with basic ADLs  PATIENT GOALS: to have better use my arm and hand  OBJECTIVE:   HAND DOMINANCE: Right  ADLs:  Transfers/ambulation related to ADLs: Utilizes [facility] for mobility in the community, 80% in the house but will walk without occasionally Eating: husband cuts foods, utilizing built up handles for self-feeding Grooming: needs assistance with washing hair UB Dressing: needs assistance with fastening bra LB Dressing: Mod-max assist for donning pants due to fear of falling, able to don socks and slip on shoes - unable to tie shoes Toileting: Mod I Bathing: Supervision Tub Shower transfers: Supervision, but able to complete transfers with use of grab bars Equipment: Shower seat with back, Grab bars, and Walk in shower  IADLs: Handwriting:  TBA at next session  MOBILITY STATUS: Needs  Assist: utilized [facility] for mobility and Hx of falls  POSTURE COMMENTS:  rounded shoulders and forward head  ACTIVITY TOLERANCE: Activity tolerance: fatigues with increased mobility and effort  FUNCTIONAL OUTCOME MEASURES: Upper Extremity Functional Scale (UEFS): TBD  UPPER EXTREMITY ROM:    Active ROM Right eval Left eval  Shoulder flexion 87   Shoulder abduction 78   Shoulder adduction    Shoulder extension    Shoulder internal rotation 90%   Shoulder external rotation 50%   Elbow flexion 132   Elbow extension -17   Wrist flexion 19   Wrist extension 35   Wrist ulnar deviation 15   Wrist radial deviation 13   Wrist pronation WNL   Wrist supination 20 from neutral   (Blank rows = not tested)  UPPER EXTREMITY MMT:     MMT Right eval Left eval  Shoulder flexion    Shoulder abduction    Shoulder adduction    Shoulder extension    Shoulder internal rotation    Shoulder external rotation    Middle trapezius    Lower trapezius    Elbow flexion    Elbow extension    Wrist flexion    Wrist extension  Wrist ulnar deviation    Wrist radial deviation    Wrist pronation    Wrist supination    (Blank rows = not tested)    Active ROM Extension (as flexion to loose gross grasp) Right 04/16/2023 Left 04/16/2023  Thumb MCP (0-60)    Thumb IP (0-80)    Thumb Radial abd/add (0-55)    Thumb Palmar abd/add (0-45)    Thumb opposition to index    Index MCP (0-90) 0   Index PIP (0-100) -30   Index DIP (0-70) -40   Long MCP (0-90) -5   Long PIP (0-100) -45   Long DIP (0-70) -5   Ring MCP (0-90_ 0   Ring PIP (0-100) -40   Ring DIP (0-70) -20   Little MCP (0-90) -7   Little PIP (0-100) -25   Little DIP (0-70) -5   (Blank rows = not tested)    HAND FUNCTION: Grip strength: Right: 7,9 = 8# lbs; Left: 30 lbs  COORDINATION: Finger Nose Finger test: slow and deliberate with RUE, but able to touch both nose and finger at this time 9 Hole Peg test: Right: unable to  pick up any pegs with RUE  Box and Blocks:  Right 16 blocks, Left 55 blocks  SENSATION: Light touch: WFL Hot/Cold: WFL  EDEMA: no swelling on eval  MUSCLE TONE: RUE: Mild and Hypertonic  COGNITION: Overall cognitive status: Within functional limits for tasks assessed  VISION: Subjective report: pt reports wearing glasses more since CVA Baseline vision: Wears glasses all the time, double vision after cataract removal Visual history: cataracts  OBSERVATIONS: Pt attempting grasp and pinch activities, frequently not incorporating index finger on R with increased flexion away from task.   TODAY'S TREATMENT:                                                                                                                              04/16/23  Engaged in massed practice with picking up small items to facilitate increased integration of index finger into pinch.   PATIENT EDUCATION: Education details: Educated on role and purpose of OT as well as potential interventions and goals for therapy based on initial evaluation findings. Person educated: Patient and Spouse Education method: Explanation, Demonstration, and Verbal cues Education comprehension: verbalized understanding and needs further education  HOME EXERCISE PROGRAM: TBD   GOALS: Goals reviewed with patient? Yes  SHORT TERM GOALS: Target date: 05/18/23  Pt and spouse will be independent with HEP for ROM, strengthening, and coordination.  Baseline: Goal status: INITIAL  2.  Pt will verbalize understanding of task modifications and/or potential AE needs to increase ease, safety, and independence w/ ADLs.  Baseline: Goal status: INITIAL  LONG TERM GOALS: Target date: 06/15/23  Pt will demonstrate improved functional grasp in RUE by 6# to open new jar, increased handwriting, and self-feeding.  Baseline: R: 8#, L: 30# Goal status: INITIAL  2.  Pt will demonstrate improved UE functional  use for ADLs as evidenced by  increasing box/ blocks score by 6 blocks with RUE. Baseline: R: 16 blocks, L: 55 blocks Goal status: INITIAL  3.  Pt will demonstrate improved shoulder ROM (shoulder flexion and external rotation) as needed for ADLs/IADLs (washing hair, putting on jacket, etc).  Baseline:  Goal status: INITIAL  4.  Pt will demonstrate improvement of finger extension by 15* TAM of index and long ringer finger to allow for more functional hand opening.  Baseline: -70 IF, -55 LF Goal status: INITIAL  5.  Pt will demonstrate improved handwriting with ability to write name and simple grocery list with 90% legibility. Baseline:  Goal status: INITIAL  6.  Pt will demonstrate improvements in functional use of dominant RUE as evidenced by improvements on FOTO. Baseline: TBD Goal status: INITIAL  ASSESSMENT:  CLINICAL IMPRESSION: Patient is a 72 y.o. female who was seen today for occupational therapy evaluation for RUE impairments s/p CVA.  Pt reports improvements in finger flexion/extension as well as shoulder mobility s/p botox and break from therapy, impacting increased engagement in HEP and self-care tasks.  Pt continues to demonstrate decreased grip strength, finger opening, and limited shoulder flexion impacting ease and independence with ADLs/IADLs. Pt currently lives with spouse in a single level home with 2 steps to enter with 1 rail.  PMHx includes hypertension, breast cancer, hyperlipidemia, Lt hip fracture secondary to fall 06/2022, CVA 05/2022 . Pt will benefit from skilled occupational therapy services to address strength and coordination, ROM, pain management, altered sensation, balance, GM/FM control, safety awareness, introduction of compensatory strategies/AE prn,  and implementation of an HEP to improve participation and safety during ADLs and IADLs.    PERFORMANCE DEFICITS: in functional skills including ADLs, IADLs, coordination, dexterity, tone, ROM, strength, pain, flexibility, Fine motor control,  Gross motor control, mobility, balance, body mechanics, endurance, decreased knowledge of precautions, decreased knowledge of use of DME, and UE functional use and psychosocial skills including environmental adaptation and routines and behaviors.   IMPAIRMENTS: are limiting patient from ADLs and IADLs.   CO-MORBIDITIES: may have co-morbidities  that affects occupational performance. Patient will benefit from skilled OT to address above impairments and improve overall function.  MODIFICATION OR ASSISTANCE TO COMPLETE EVALUATION: Min-Moderate modification of tasks or assist with assess necessary to complete an evaluation.  OT OCCUPATIONAL PROFILE AND HISTORY: Detailed assessment: Review of records and additional review of physical, cognitive, psychosocial history related to current functional performance.  CLINICAL DECISION MAKING: Moderate - several treatment options, min-mod task modification necessary  REHAB POTENTIAL: Good  EVALUATION COMPLEXITY: Moderate    PLAN:  OT FREQUENCY: 2x/week  OT DURATION: 8 weeks  PLANNED INTERVENTIONS: self care/ADL training, therapeutic exercise, therapeutic activity, neuromuscular re-education, manual therapy, passive range of motion, functional mobility training, splinting, electrical stimulation, ultrasound, compression bandaging, moist heat, cryotherapy, patient/family education, psychosocial skills training, energy conservation, coping strategies training, and DME and/or AE instructions  RECOMMENDED OTHER SERVICES: NA  CONSULTED AND AGREED WITH PLAN OF CARE: Patient and family member/caregiver  PLAN FOR NEXT SESSION: time self-feeding, assess handwriting, complete UEFS or FOTO, initiate coordination (pen tricks) and/or finger flexion and extension HEP   Referring diagnosis? 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 (if different than referring diagnosis)  Hemiplegia and hemiparesis  following cerebral infarction affecting right dominant side (HCC)  Muscle weakness (generalized)  Stiffness of right shoulder, not elsewhere classified  Other lack of coordination What was this (referring dx) caused by? []   Surgery []  Fall []  Ongoing issue []  Arthritis [x]  Other: ___CVA_________  Laterality: [x]  Rt []  Lt []  Both  Check all possible CPT codes:  *CHOOSE 10 OR LESS*    [x]  97110 (Therapeutic Exercise)  []  92507 (SLP Treatment)  [x]  97112 (Neuro Re-ed)   []  92526 (Swallowing Treatment)   []  97116 (Gait Training)   []  [id number] (Cognitive Training, 1st 15 minutes) [x]  97140 (Manual Therapy)   []  97130 (Cognitive Training, each add'l 15 minutes)  [x]  97164 (Re-evaluation)                              []  Other, List CPT Code ____________  [x]  97530 (Therapeutic Activities)     [x]  97535 (Self Care)   []  All codes above (97110 - 97535)  []  97012 (Mechanical Traction)  []  97014 (E-stim Unattended)  [x]  97032 (E-stim manual)  []  97033 (Ionto)  [x]  97035 (Ultrasound) []  97750 (Physical Performance Training) []  [id number] (Aquatic Therapy) []  97016 (Vasopneumatic Device) []  [id number] (Paraffin) []  97034 (Contrast Bath) []  97597 (Wound Care 1st 20 sq cm) []  97598 (Wound Care each add'l 20 sq cm) [x]  97760 (Orthotic Fabrication, Fitting, Training Initial) []  [id number] (Prosthetic Management and Training Initial) [x]  57846 (Orthotic or Prosthetic Training/ Modification Subsequent)   Vidya Bamford, OT 04/16/2023, 4:09 PM

## 2023-04-17 ENCOUNTER — Ambulatory Visit: Payer: Medicare PPO | Admitting: Physical Medicine & Rehabilitation

## 2023-04-18 ENCOUNTER — Ambulatory Visit: Payer: Medicare PPO | Admitting: Rehabilitative and Restorative Service Providers"

## 2023-04-18 ENCOUNTER — Telehealth (HOSPITAL_COMMUNITY): Payer: Self-pay | Admitting: *Deleted

## 2023-04-18 ENCOUNTER — Encounter: Payer: Self-pay | Admitting: Rehabilitative and Restorative Service Providers"

## 2023-04-18 DIAGNOSIS — R293 Abnormal posture: Secondary | ICD-10-CM | POA: Diagnosis not present

## 2023-04-18 DIAGNOSIS — M6281 Muscle weakness (generalized): Secondary | ICD-10-CM

## 2023-04-18 DIAGNOSIS — R2689 Other abnormalities of gait and mobility: Secondary | ICD-10-CM | POA: Diagnosis not present

## 2023-04-18 DIAGNOSIS — R2681 Unsteadiness on feet: Secondary | ICD-10-CM

## 2023-04-18 NOTE — Therapy (Signed)
OUTPATIENT PHYSICAL THERAPY TREATMENT NOTE   Patient Name: Angela Buchanan MRN: [id number] DOB:September 03, 1951, 72 y.o., female Today's Date: 04/18/2023  PCP:  Garth Bigness, MD   REFERRING PROVIDER:  Garth Bigness, MD      END OF SESSION:   PT End of Session - 04/18/23 1227     Visit Number 33    Date for PT Re-Evaluation 05/28/23    Authorization Type [employer]    Authorization Time Period 03/19/2023 - 05/28/2023    Authorization - Visit Number 5    Authorization - Number of Visits 10    Progress Note Due on Visit 40    PT Start Time 1225    PT Stop Time 1307    PT Time Calculation (min) 42 min    Activity Tolerance Patient tolerated treatment well    Behavior During Therapy [facility] for tasks assessed/performed                Past Medical History:  Diagnosis Date   Atypical chest pain 07/19/2021   Bigeminy    no current med.   Breast cancer (HCC) 06/2017   right   Bruises easily    CAD in native artery 10/31/2022   Carotid bruit 11/13/2019   Dental crowns present    History of diverticulitis    Hypertension    states under control with med., has been on med. x 5 yr.   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   Past Surgical History:  Procedure Laterality Date   ABDOMINAL HYSTERECTOMY     partial   BREAST LUMPECTOMY Right 06/22/2017   Malignant   BREAST LUMPECTOMY WITH RADIOACTIVE SEED AND SENTINEL LYMPH NODE BIOPSY Right 06/22/2017   Procedure: RIGHT BREAST LUMPECTOMY WITH RADIOACTIVE SEED AND RIGHT AXILLARY DEEP SENTINEL LYMPH NODE BIOPSY WITH BLUE DYE INJECTION;  Surgeon: Claud Kelp, MD;  Location: [facility];  Service: General;  Laterality: Right;   BREAST LUMPECTOMY WITH  RADIOACTIVE SEED LOCALIZATION Left 06/22/2017   Procedure: LEFT BREAST LUMPECTOMY WITH RADIOACTIVE SEED LOCALIZATION;  Surgeon: Claud Kelp, MD;  Location: [facility];  Service: General;  Laterality: Left;   CATARACT EXTRACTION W/ INTRAOCULAR LENS  IMPLANT, BILATERAL Bilateral    EXCISION OF BREAST BIOPSY Left 06/22/2017   benign   LOOP RECORDER INSERTION N/A 05/16/2022   Procedure: LOOP RECORDER INSERTION;  Surgeon: Lanier Prude, MD;  Location: [facility];  Service: Cardiovascular;  Laterality: N/A;   PERCUTANEOUS PINNING Right 06/22/2022   Procedure: PERCUTANEOUS PINNING OF RIGHT HIP;  Surgeon: Bjorn Pippin, MD;  Location: [facility];  Service: Orthopedics;  Laterality: Right;   TONSILLECTOMY     age 72   Patient Active Problem List   Diagnosis Date Noted   Right spastic hemiplegia (HCC) 12/14/2022   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   CAD in native artery 10/31/2022   Malnutrition of moderate degree 06/22/2022   Closed fracture of femur, neck (HCC) 06/21/2022   Urinary frequency 06/21/2022   Hypokalemia 06/21/2022   Heart murmur 06/21/2022   Hypothyroidism 06/21/2022   Dysphagia 05/17/2022   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   CVA (cerebral vascular accident) (HCC) 05/12/2022   Osteoporosis 01/24/2022   Atypical chest pain 07/19/2021   Temporomandibular joint (  TMJ) pain 07/16/2020   Referred otalgia of both ears 07/16/2020   PAC (premature atrial contraction) 05/11/2020   PVC (premature ventricular contraction) 05/11/2020   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   Carotid bruit 11/13/2019   Pure hypercholesterolemia 11/13/2019   Malignant neoplasm of upper-outer quadrant of right breast in female, estrogen receptor positive (HCC) 05/22/2017   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   Diplopia 09/17/2014   Dizziness and giddiness 09/17/2014   Essential hypertension 07/22/2014   Chest pain 07/22/2014   Family history of heart disease  07/22/2014    REFERRING DIAG:  R29.6 (ICD-10-CM) - Repeated falls, closed fracture of Rt hip    THERAPY DIAG:  Muscle weakness (generalized)  Unsteadiness on feet  Abnormal posture  Rationale for Evaluation and Treatment Rehabilitation  PERTINENT HISTORY: hypertension, breast cancer, hyperlipidemia, Lt hip fracture secondary to fall 06/2022, CVA 06/2022   PRECAUTIONS:  Fall, Rt breast cancer with node removal, osteoporosis    SUBJECTIVE:                                                                                                                                                                                      SUBJECTIVE STATEMENT:  Pt reports that she went to OT earlier this week and she will resume her treatment session.   Pt spouse present during visit.   PAIN:  PAIN:  Are you having pain? Yes NPRS scale: 3-4/10 Pain location: Right shoulder PAIN TYPE: aching Aggravating factors: use Relieving factors: Medication    OBJECTIVE: (objective measures completed at initial evaluation unless otherwise dated)  DIAGNOSTIC FINDINGS: Rt hip fracture and ORIF     COGNITION: Overall cognitive status: Within functional limits for tasks assessed                         SENSATION: WFL  POSTURE: rounded shoulders, forward head, flexed trunk , and weight shift left   PALPATION: NA   LOWER EXTREMITY ROM: Rt hip limited by 50%, hamstrings limited by 50% bil.    LOWER EXTREMITY MMT:   MMT Right eval Right  09/27/22 Left eval Right 10/30/22 Right  01/17/23 Left 10/30/22 Left 01/17/23  Hip flexion 3+ 4- 4- 4- 4 4- 4+  Hip extension           Hip abduction 3-  4    4  Hip adduction           Hip internal rotation           Hip external rotation           Knee flexion 3- 4- 3+  4  4+  Knee  extension 3+ 4 4- 4 painful hip 4+ 4- 4+  Ankle dorsiflexion 3+ 4- 4- 4-  4   Ankle plantarflexion           Ankle inversion           Ankle eversion            (Blank rows =  not tested)   FUNCTIONAL TESTS:   Eval: Timed up and go (TUG): 1 min, 27 seconds   09/27/22: 5x sit to stand: 29 seconds with use of Lt hand  TUG: 27 seconds   10/30/22: 5x sit to stand: 23 sec use of LT hand     TUG: 26 sec with standard cane   11/20/22: 5x sit to stand: 18.68 seconds    TUG: 19.45 without device    3 minute walk test: 225 feet with a standard cane   01/10/23: 5x sit to stand: 15.77 seconds   01/17/23: TUG with cane: 15.41 seconds with cane    3 min walk test: 313 feet with cane    03/07/23: 3 min walk 275 feet with single point cane   TUG: 16 seconds   03/14/23: 6 min walk test: 723 feet with standard cane    TUG: 14.16 without cane    5x sit to stand: 12.15 seconds without hand   03/28/23 TUG 11 seconds without cane  5x STS 10.6 seconds no Ues  04/18/2023: 6 min walk test:  532 ft with [facility]      TODAY'S TREATMENT:    DATE:  04/18/2023 Nustep level 4 x7 minutes with LE only. PT present throughout to discuss status FWD and lateral step ups with 6" step with unilateral UE support x10 bilat Seated modified dead lift with 6# dumbbell x7 Farmers carry 6# L UE from gym mat table to mirror 2 laps Amb 6 min around PT gym with [facility] for 532 ft Leg Press (seat at 5) 50# 2x10   DATE:  04/11/2023 Nustep level 4 x 9 minutes with LE only. PT present throughout to discuss status FWD and lateral step ups with 6" step with unilateral UE support x10 bilat Seated modified dead lift with 6# dumbbell x7 Farmers carry 3# L UE from gym mat table to mirror 2 laps Walk around building with gait belt and cane- SBA by PT for safety Sit to/from stand holding 6# dumbbell 2x10 Standing rocker board PF/DF x2 min 4 square stepping to dots on floor x6 reps Standing at barre performing hip extension x10 bilat  DATE:  04/04/2023 Nustep level 4 x7 minutes with LE only. PT present throughout to discuss status FWD and lateral step ups with 4" step with unilateral UE support x10  bilat Standing 2" step with unilateral UE support performing heel tap downs x10 bilat Seated modified dead lift with 6# dumbbell x7 Farmers carry 3# L UE from gym mat table to mirror 2 laps Sit to/from stand holding 3# dumbbell 2x5 Standing rocker board PF/DF x2 min 4 square stepping to dots on floor x6 reps Standing at barre performing hip extension x10 bilat    PATIENT EDUCATION:  Education details: Access Code: [email or web address] Person educated: Patient Education method: Programmer, multimedia, Facilities manager, and Handouts Education comprehension: verbalized understanding and returned demonstration   HOME EXERCISE PROGRAM: Access Code: [email or web address] URL: [email or web address] Date: 09/27/2022 Prepared by: Tresa Endo  Exercises - Seated Long Arc Quad  - 3 x daily - 7 x weekly - 2 sets - 10 reps - 5 hold - Seated March   -  3 x daily - 7 x weekly - 3 sets - 10 reps - Seated Heel Toe Raises   - 3 x daily - 7 x weekly - 2 sets - 10 reps - Seated Isometric Hip Adduction with Ball  - 3 x daily - 7 x weekly - 2 sets - 10 reps - Sit to Stand with Armchair  - 2 x daily - 7 x weekly - 2 sets - 5-10 reps - Standing Hip Abduction with Counter Support  - 1 x daily - 7 x weekly - 2 sets - 10 reps - Heel Raises with Counter Support  - 2 x daily - 7 x weekly - 2 sets - 10 reps  Patient Education - Walking with a Single [employer] - Modified 4 Point Gait Pattern  ASSESSMENT:   CLINICAL IMPRESSION:      Ms Biddinger presents to skilled PT reporting that she is resuming OT treatment.  Patient with decreased distance noted with 6 minute walk test, but this was tested towards end of visit, as pt was fatigued.  Patient able to increase weight with farmer's carry today during session.  Patient with no loss of balance noted during ambulation during visit today.  Patient continues to progress towards goal related activities.  OBJECTIVE IMPAIRMENTS: Abnormal gait, decreased activity tolerance, decreased balance,  decreased mobility, difficulty walking, decreased strength, decreased safety awareness, impaired perceived functional ability, impaired flexibility, postural dysfunction, and pain.    ACTIVITY LIMITATIONS: carrying, lifting, sitting, standing, stairs, transfers, hygiene/grooming, and locomotion level   PARTICIPATION LIMITATIONS: meal prep, cleaning, laundry, driving, shopping, and community activity   PERSONAL FACTORS: Age, Past/current experiences, and 1-2 comorbidities: CVA, falls, Rt hip fracture with ORIF  are also affecting patient's functional outcome.    REHAB POTENTIAL: Good   CLINICAL DECISION MAKING: Evolving/moderate complexity   EVALUATION COMPLEXITY: Moderate     GOALS: Goals reviewed with patient? Yes   SHORT TERM GOALS: Target date: 09/06/2022   Be independent in initial HEP Baseline: Goal status: Goal met 08/14/22   2.  Improve LE strength to perform sit to stand with moderate Lt UE support Baseline:  Goal status: Goal met 08/28/22  3.  Perform TUG in < or = to 60 seconds to reduce falls risk Baseline: 27 seconds (09/27/22) Goal status: MET      LONG TERM GOALS: Target date:  05/28/23   Be independent in advanced HEP Baseline: independent in current HEP and further progress is needed as she becomes more stable (03/14/23)  Goal status: in progress    2.  Perform TUG in < or = to 13 seconds to reduce falls risk Baseline: 14.16 seconds without cane (03/14/23) Goal status: IN PROGRESS   3.  ambulate > or = to 875 feet in 6 minutes to improve community ambulation and independence  Baseline: 723 feet (03/14/23) Goal status: REVISED    4.  Ambulate with hemi walker with supervision or no guard due to improved gait Baseline: able to do this Goal status: MET   5.  perform 5x sit to stand in < or = to 12 seconds to reduce falls risk  Baseline: 12.15 seconds (03/14/23)  Goal Status: In progress   6. Ambulate without device for home distances 100% of the time due  to improved stability and reduced falls risk  Baseline: using cane 40% when fatigued (03/14/23)  Goal status: in progress   7. Improve balance to walk her dog short distances with independence   Baseline: not safe  to walk independently with her dog (03/14/23)  Goal status: NEW     PLAN:   PT FREQUENCY: 1x/week   PT DURATION: 10 weeks   PLANNED INTERVENTIONS: Therapeutic exercises, Therapeutic activity, Neuromuscular re-education, Balance training, Gait training, Patient/Family education, Self Care, Joint mobilization, Stair training, Dry Needling, Electrical stimulation, Cryotherapy, Moist heat, Taping, Manual therapy, and Re-evaluation   PLAN FOR NEXT SESSION: Continue to work on gait , strength and balance. Increase focus on balance on foam/unsteady surfaces      Reather Laurence, PT, DPT 04/18/23, 1:12 PM   [facility] [address], [address] [place], [place] [address] Phone # [phone] Fax [phone]

## 2023-04-18 NOTE — Telephone Encounter (Signed)
Reaching out to patient to offer assistance regarding upcoming cardiac imaging study; pt verbalizes understanding of appt date/time, parking situation and where to check in, pre-test NPO status and medications ordered, and verified current allergies; name and call back number provided for further questions should they arise Angela Sharpe RN Navigator Cardiac Imaging [facility] [phone] office [phone] cell  

## 2023-04-19 ENCOUNTER — Encounter: Payer: Medicare PPO | Admitting: Physical Medicine & Rehabilitation

## 2023-04-19 ENCOUNTER — Ambulatory Visit (HOSPITAL_COMMUNITY)
Admission: RE | Admit: 2023-04-19 | Discharge: 2023-04-19 | Disposition: A | Discharge status: Home / Self Care | Payer: Medicare PPO | Source: Ambulatory Visit | Attending: Family | Admitting: Family

## 2023-04-19 DIAGNOSIS — R072 Precordial pain: Secondary | ICD-10-CM

## 2023-04-19 MED ORDER — IOHEXOL 350 MG/ML SOLN
95.0000 mL | Freq: Once | INTRAVENOUS | Status: AC | PRN
  Administered 2023-04-19: 95 mL via INTRAVENOUS

## 2023-04-19 MED ORDER — IOHEXOL 350 MG/ML SOLN
80.0000 mL | Freq: Once | INTRAVENOUS | Status: AC | PRN
  Administered 2023-04-19: 80 mL via INTRAVENOUS

## 2023-04-19 MED ORDER — NITROGLYCERIN 0.4 MG SL SUBL
SUBLINGUAL_TABLET | SUBLINGUAL | Status: AC
  Filled 2023-04-19: qty 2

## 2023-04-19 MED ORDER — NITROGLYCERIN 0.4 MG SL SUBL
0.8000 mg | SUBLINGUAL_TABLET | Freq: Once | SUBLINGUAL | Status: AC
  Administered 2023-04-19: 0.8 mg via SUBLINGUAL

## 2023-04-20 ENCOUNTER — Encounter (HOSPITAL_BASED_OUTPATIENT_CLINIC_OR_DEPARTMENT_OTHER): Payer: Self-pay | Admitting: *Deleted

## 2023-04-20 ENCOUNTER — Telehealth (HOSPITAL_BASED_OUTPATIENT_CLINIC_OR_DEPARTMENT_OTHER): Payer: Self-pay

## 2023-04-20 DIAGNOSIS — R072 Precordial pain: Secondary | ICD-10-CM

## 2023-04-20 DIAGNOSIS — I25118 Atherosclerotic heart disease of native coronary artery with other forms of angina pectoris: Secondary | ICD-10-CM

## 2023-04-20 NOTE — Telephone Encounter (Signed)
Advised patient She continues to have chest pain  Will proceed with Steffanie Dunn

## 2023-04-20 NOTE — Addendum Note (Signed)
Addended by: Regis Bill B on: 04/20/2023 04:42 PM   Modules accepted: Orders

## 2023-04-20 NOTE — Telephone Encounter (Signed)
Cardiac CTA with incomplete imaging of distal LAD. Still with persistent chest pain. Plan for lexiscan myoview to rule out significant ischemia.   Shared Decision Making/Informed Consent  The risks [chest pain, shortness of breath, cardiac arrhythmias, dizziness, blood pressure fluctuations, myocardial infarction, stroke/transient ischemic attack, nausea, vomiting, allergic reaction, radiation exposure, metallic taste sensation and life-threatening complications (estimated to be 1 in 10,000)], benefits (risk stratification, diagnosing coronary artery disease, treatment guidance) and alternatives of a nuclear stress test were discussed in detail with Angela Buchanan and she agrees to proceed.  Alver Sorrow, NP

## 2023-04-20 NOTE — Addendum Note (Signed)
Addended by: Alver Sorrow on: 04/20/2023 04:59 PM   Modules accepted: Orders

## 2023-04-20 NOTE — Telephone Encounter (Addendum)
Left message for patient to call back    ----- Message from Alver Sorrow sent at 04/20/2023  1:24 PM EDT ----- Coronary calcium score of 56.2. There was overall mild atherosclerosis. However, the distal LAD (one of the heart arteries) was not visualized well and may have more significant stenosis.   If still having chest discomfort, recommend further evaluation with lexiscan myoview to assess bloodflow to the distal portion of the LAD.   If chest pain has resolved, no need for further evaluation.

## 2023-04-20 NOTE — Telephone Encounter (Signed)
This encounter was created in error - please disregard.

## 2023-04-23 ENCOUNTER — Ambulatory Visit: Payer: Medicare PPO | Admitting: Occupational Therapy

## 2023-04-23 DIAGNOSIS — I69351 Hemiplegia and hemiparesis following cerebral infarction affecting right dominant side: Secondary | ICD-10-CM | POA: Diagnosis not present

## 2023-04-23 DIAGNOSIS — M25611 Stiffness of right shoulder, not elsewhere classified: Secondary | ICD-10-CM | POA: Diagnosis not present

## 2023-04-23 DIAGNOSIS — R293 Abnormal posture: Secondary | ICD-10-CM

## 2023-04-23 DIAGNOSIS — M6281 Muscle weakness (generalized): Secondary | ICD-10-CM | POA: Diagnosis not present

## 2023-04-23 DIAGNOSIS — M79601 Pain in right arm: Secondary | ICD-10-CM | POA: Diagnosis not present

## 2023-04-23 DIAGNOSIS — R278 Other lack of coordination: Secondary | ICD-10-CM | POA: Diagnosis not present

## 2023-04-23 NOTE — Therapy (Signed)
OUTPATIENT OCCUPATIONAL THERAPY NEURO EVALUATION  Patient Name: Angela Buchanan MRN: [id number] DOB:Mar 21, 1951, 72 y.o., female Today's Date: 04/23/2023  PCP: Shon Hale, MD REFERRING PROVIDER: Shon Hale, MD  END OF SESSION:  OT End of Session - 04/23/23 1322     Visit Number 2    Number of Visits 17    Date for OT Re-Evaluation 06/15/23    Authorization Type [employer]    OT Start Time 1320    OT Stop Time 1400    OT Time Calculation (min) 40 min    Behavior During Therapy [facility] for tasks assessed/performed              Past Medical History:  Diagnosis Date   Atypical chest pain 07/19/2021   Bigeminy    no current med.   Breast cancer (HCC) 06/2017   right   Bruises easily    CAD in native artery 10/31/2022   Carotid bruit 11/13/2019   Dental crowns present    History of diverticulitis    Hypertension    states under control with med., has been on med. x 5 yr.   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;  Service: General;  Laterality: Right;   BREAST LUMPECTOMY WITH RADIOACTIVE SEED LOCALIZATION Left 06/22/2017   Procedure: LEFT BREAST LUMPECTOMY WITH RADIOACTIVE SEED LOCALIZATION;  Surgeon: Claud Kelp, MD;  Location: [facility];  Service: General;   Laterality: Left;   CATARACT EXTRACTION W/ INTRAOCULAR LENS  IMPLANT, BILATERAL Bilateral    EXCISION OF BREAST BIOPSY Left 06/22/2017   benign   LOOP RECORDER INSERTION N/A 05/16/2022   Procedure: LOOP RECORDER INSERTION;  Surgeon: Lanier Prude, MD;  Location: [facility];  Service: Cardiovascular;  Laterality: N/A;   PERCUTANEOUS PINNING Right 06/22/2022   Procedure: PERCUTANEOUS PINNING OF RIGHT HIP;  Surgeon: Bjorn Pippin, MD;  Location: [facility];  Service: Orthopedics;  Laterality: Right;   TONSILLECTOMY     age 85   Patient Active Problem List   Diagnosis Date Noted   Right spastic hemiplegia (HCC) 12/14/2022   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 05/11/2020   PVC (premature ventricular contraction) 05/11/2020   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   Carotid bruit 11/13/2019   Pure hypercholesterolemia 11/13/2019   Malignant neoplasm of upper-outer quadrant  of right breast in female, estrogen receptor positive (HCC) 05/22/2017   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   Diplopia 09/17/2014   Dizziness and giddiness 09/17/2014   Essential hypertension 07/22/2014   Chest pain 07/22/2014   Family history of heart disease 07/22/2014    ONSET DATE: 05/13/22 (new referral 04/09/23)  REFERRING DIAG: G83.23 (ICD-10-CM) - 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   Pt accompanied by: self and significant other ([name])  PERTINENT HISTORY: hypertension, breast cancer, hyperlipidemia, Lt hip fracture secondary to fall 06/2022, CVA 05/2022    PRECAUTIONS: Fall, Rt breast cancer with node removal, osteoporosis   WEIGHT BEARING RESTRICTIONS: No  PAIN:  Are you having pain? No  FALLS: Has patient fallen in last 6 months? No  LIVING ENVIRONMENT: Lives with: lives with their spouse Lives in: House/apartment Stairs: Yes: External: 2 steps; on left going up Has following equipment at home: Quad cane large base, Environmental consultant - 2 wheeled, Hemi walker, Wheelchair (manual), shower chair, and Grab bars  PLOF: Independent and Independent with basic ADLs  PATIENT GOALS: to have better use my arm and hand  OBJECTIVE:   HAND DOMINANCE: Right  ADLs:  Transfers/ambulation related to ADLs: Utilizes [facility] for mobility in the community, 80% in the house but will walk without occasionally Eating: husband cuts foods, utilizing built up handles for self-feeding Grooming: needs assistance with washing hair UB Dressing: needs assistance with fastening bra LB Dressing: Mod-max assist for donning pants due to fear of falling, able to don socks and slip on shoes - unable to tie shoes Toileting: Mod I Bathing: Supervision Tub Shower transfers: Supervision, but able to complete transfers with use of grab bars Equipment: Shower seat with back, Grab bars, and Walk in shower  IADLs: Handwriting:  TBA at next session  MOBILITY STATUS: Needs Assist: utilized [facility] for mobility and Hx of falls  POSTURE COMMENTS:  rounded shoulders and forward  head  ACTIVITY TOLERANCE: Activity tolerance: fatigues with increased mobility and effort  FUNCTIONAL OUTCOME MEASURES: Upper Extremity Functional Scale (UEFS): TBD  UPPER EXTREMITY ROM:    Active ROM Right eval Left eval  Shoulder flexion 87   Shoulder abduction 78   Shoulder adduction    Shoulder extension    Shoulder internal rotation 90%   Shoulder external rotation 50%   Elbow flexion 132   Elbow extension -17   Wrist flexion 19   Wrist extension 35   Wrist ulnar deviation 15   Wrist radial deviation 13   Wrist pronation WNL   Wrist supination 20 from neutral   (Blank rows = not tested)  UPPER EXTREMITY MMT:     MMT Right eval Left eval  Shoulder flexion    Shoulder abduction    Shoulder adduction    Shoulder extension    Shoulder internal rotation    Shoulder external rotation    Middle trapezius    Lower trapezius    Elbow flexion    Elbow extension    Wrist flexion    Wrist extension    Wrist ulnar deviation    Wrist radial deviation    Wrist pronation    Wrist supination    (Blank  rows = not tested)    Active ROM Extension (as flexion to loose gross grasp) Right 04/23/2023 Left 04/23/2023  Thumb MCP (0-60)    Thumb IP (0-80)    Thumb Radial abd/add (0-55)    Thumb Palmar abd/add (0-45)    Thumb opposition to index    Index MCP (0-90) 0   Index PIP (0-100) -30   Index DIP (0-70) -40   Long MCP (0-90) -5   Long PIP (0-100) -45   Long DIP (0-70) -5   Ring MCP (0-90_ 0   Ring PIP (0-100) -40   Ring DIP (0-70) -20   Little MCP (0-90) -7   Little PIP (0-100) -25   Little DIP (0-70) -5   (Blank rows = not tested)    HAND FUNCTION: Grip strength: Right: 7,9 = 8# lbs; Left: 30 lbs  COORDINATION: Finger Nose Finger test: slow and deliberate with RUE, but able to touch both nose and finger at this time 9 Hole Peg test: Right: unable to pick up any pegs with RUE  Box and Blocks:  Right 16 blocks, Left 55 blocks  SENSATION: Light touch:  WFL Hot/Cold: WFL  EDEMA: no swelling on eval  MUSCLE TONE: RUE: Mild and Hypertonic  COGNITION: Overall cognitive status: Within functional limits for tasks assessed  VISION: Subjective report: pt reports wearing glasses more since CVA Baseline vision: Wears glasses all the time, double vision after cataract removal Visual history: cataracts  OBSERVATIONS: Pt attempting grasp and pinch activities, frequently not incorporating index finger on R with increased flexion away from task.   TODAY'S TREATMENT:                                                                                                                              04/23/23 Gross grasp/release: stacking and unstacking cones in various planes with focus on shoulder flexion, elbow extension, wrist flexion/extension and radial/ulnar deviation as needed to pick up and grasp cones.  OT changing planes to facilitate increased reach and challenge ROM.   UE ROM: engaged in supination stretch, radial/ulnar deviation, wrist flexion/extension, and prayer stretch for further wrist and finger extension.  OT providing demonstration and modifications to setup to facilitate increased ROM and stretch.  Pt demonstrating supination to neutral, therefore encouraged in PROM/stretch as well as prayer stretch for further wrist extension. Exercises - Forearm Supination PROM  - 2-3 x daily - 10 reps - 5 sec hold - Wrist Flexion and Extension With Arm Supported  - 2-3 x daily - 10 reps - Wrist Radial Deviation AROM  - 2-3 x daily - 10 reps - Seated Wrist Prayer Stretch  - 2-3 x daily - 10 reps - 5 sec hold   04/16/23  Engaged in massed practice with picking up small items to facilitate increased integration of index finger into pinch.   PATIENT EDUCATION: Education details: ongoing condition specific education. Person educated: Patient and Spouse Education method: Explanation, Demonstration, and  Verbal cues Education comprehension: verbalized  understanding and needs further education  HOME EXERCISE PROGRAM: Access Code: [email or web address] URL: [email or web address] Date: 04/23/2023 Prepared by: [facility] - [facility]    GOALS: Goals reviewed with patient? Yes  SHORT TERM GOALS: Target date: 05/18/23  Pt and spouse will be independent with HEP for ROM, strengthening, and coordination.  Baseline: Goal status: IN PROGRESS  2.  Pt will verbalize understanding of task modifications and/or potential AE needs to increase ease, safety, and independence w/ ADLs.  Baseline: Goal status: IN PROGRESS  LONG TERM GOALS: Target date: 06/15/23  Pt will demonstrate improved functional grasp in RUE by 6# to open new jar, increased handwriting, and self-feeding.  Baseline: R: 8#, L: 30# Goal status: IN PROGRESS  2.  Pt will demonstrate improved UE functional use for ADLs as evidenced by increasing box/ blocks score by 6 blocks with RUE. Baseline: R: 16 blocks, L: 55 blocks Goal status: IN PROGRESS  3.  Pt will demonstrate improved shoulder ROM (shoulder flexion and external rotation) as needed for ADLs/IADLs (washing hair, putting on jacket, etc).  Baseline:  Goal status: INITIAL  4.  Pt will demonstrate improvement of finger extension by 15* TAM of index and long ringer finger to allow for more functional hand opening.  Baseline: -70 IF, -55 LF Goal status: IN PROGRESS  5.  Pt will demonstrate improved handwriting with ability to write name and simple grocery list with 90% legibility. Baseline:  Goal status: IN PROGRESS  6.  Pt will demonstrate improvements in functional use of dominant RUE as evidenced by improvements on FOTO. Baseline: TBD Goal status: IN PROGRESS  ASSESSMENT:  CLINICAL IMPRESSION: Pt requiring increased time/effort with full hand opening to grasp cones during reaching, utilizing LUE 1/3 of the time to assist in opening of hand and positioning.  Pt demonstrating decreased  radial deviation and wrist extension, therefore focused remainder of session on AROM and PROM exercises.  PERFORMANCE DEFICITS: in functional skills including ADLs, IADLs, coordination, dexterity, tone, ROM, strength, pain, flexibility, Fine motor control, Gross motor control, mobility, balance, body mechanics, endurance, decreased knowledge of precautions, decreased knowledge of use of DME, and UE functional use and psychosocial skills including environmental adaptation and routines and behaviors.   IMPAIRMENTS: are limiting patient from ADLs and IADLs.   CO-MORBIDITIES: may have co-morbidities  that affects occupational performance. Patient will benefit from skilled OT to address above impairments and improve overall function.  MODIFICATION OR ASSISTANCE TO COMPLETE EVALUATION: Min-Moderate modification of tasks or assist with assess necessary to complete an evaluation.  OT OCCUPATIONAL PROFILE AND HISTORY: Detailed assessment: Review of records and additional review of physical, cognitive, psychosocial history related to current functional performance.  CLINICAL DECISION MAKING: Moderate - several treatment options, min-mod task modification necessary  REHAB POTENTIAL: Good  EVALUATION COMPLEXITY: Moderate    PLAN:  OT FREQUENCY: 2x/week  OT DURATION: 8 weeks  PLANNED INTERVENTIONS: self care/ADL training, therapeutic exercise, therapeutic activity, neuromuscular re-education, manual therapy, passive range of motion, functional mobility training, splinting, electrical stimulation, ultrasound, compression bandaging, moist heat, cryotherapy, patient/family education, psychosocial skills training, energy conservation, coping strategies training, and DME and/or AE instructions  RECOMMENDED OTHER SERVICES: NA  CONSULTED AND AGREED WITH PLAN OF CARE: Patient and family member/caregiver  PLAN FOR NEXT SESSION: time self-feeding, assess handwriting, complete UEFS or FOTO, initiate  coordination (pen tricks) and/or finger flexion and extension HEP     Eliazar Olivar, OT 04/23/2023, 4:24 PM

## 2023-04-24 ENCOUNTER — Telehealth: Payer: Self-pay | Admitting: Cardiovascular Disease

## 2023-04-24 NOTE — Telephone Encounter (Signed)
Returned call to patient,   Answered questions regarding CT done on 7/18 and need for further imaging.

## 2023-04-24 NOTE — Telephone Encounter (Signed)
Patient is requesting to speak with RN Rutherford Guys in regards to  CT scan. Please advise.

## 2023-04-25 ENCOUNTER — Ambulatory Visit: Payer: Medicare PPO

## 2023-04-25 DIAGNOSIS — M6281 Muscle weakness (generalized): Secondary | ICD-10-CM | POA: Diagnosis not present

## 2023-04-25 DIAGNOSIS — R2689 Other abnormalities of gait and mobility: Secondary | ICD-10-CM | POA: Diagnosis not present

## 2023-04-25 DIAGNOSIS — R293 Abnormal posture: Secondary | ICD-10-CM | POA: Diagnosis not present

## 2023-04-25 DIAGNOSIS — R2681 Unsteadiness on feet: Secondary | ICD-10-CM

## 2023-04-25 NOTE — Therapy (Addendum)
OUTPATIENT PHYSICAL THERAPY TREATMENT NOTE   Patient Name: Angela Buchanan MRN: [id number] DOB:1951-07-31, 72 y.o., female Today's Date: 04/25/2023  PCP:  Garth Bigness, MD   REFERRING PROVIDER:  Garth Bigness, MD      END OF SESSION:   PT End of Session - 04/25/23 1533     Visit Number 34    Date for PT Re-Evaluation 05/28/23    Authorization Type [employer]    Authorization Time Period 03/19/2023 - 05/28/2023    Authorization - Visit Number 6    Authorization - Number of Visits 10    Progress Note Due on Visit 40    PT Start Time 1402    PT Stop Time 1445    PT Time Calculation (min) 43 min    Activity Tolerance Patient tolerated treatment well    Behavior During Therapy [facility] for tasks assessed/performed                 Past Medical History:  Diagnosis Date   Atypical chest pain 07/19/2021   Bigeminy    no current med.   Breast cancer (HCC) 06/2017   right   Bruises easily    CAD in native artery 10/31/2022   Carotid bruit 11/13/2019   Dental crowns present    History of diverticulitis    Hypertension    states under control with med., has been on med. x 5 yr.   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   Past Surgical History:  Procedure Laterality Date   ABDOMINAL HYSTERECTOMY     partial   BREAST LUMPECTOMY Right 06/22/2017   Malignant   BREAST LUMPECTOMY WITH RADIOACTIVE SEED AND SENTINEL LYMPH NODE BIOPSY Right 06/22/2017   Procedure: RIGHT BREAST LUMPECTOMY WITH RADIOACTIVE SEED AND RIGHT AXILLARY DEEP SENTINEL LYMPH NODE BIOPSY WITH BLUE DYE INJECTION;  Surgeon: Claud Kelp, MD;  Location: [facility];  Service: General;  Laterality: Right;   BREAST LUMPECTOMY WITH  RADIOACTIVE SEED LOCALIZATION Left 06/22/2017   Procedure: LEFT BREAST LUMPECTOMY WITH RADIOACTIVE SEED LOCALIZATION;  Surgeon: Claud Kelp, MD;  Location: [facility];  Service: General;  Laterality: Left;   CATARACT EXTRACTION W/ INTRAOCULAR LENS  IMPLANT, BILATERAL Bilateral    EXCISION OF BREAST BIOPSY Left 06/22/2017   benign   LOOP RECORDER INSERTION N/A 05/16/2022   Procedure: LOOP RECORDER INSERTION;  Surgeon: Lanier Prude, MD;  Location: [facility];  Service: Cardiovascular;  Laterality: N/A;   PERCUTANEOUS PINNING Right 06/22/2022   Procedure: PERCUTANEOUS PINNING OF RIGHT HIP;  Surgeon: Bjorn Pippin, MD;  Location: [facility];  Service: Orthopedics;  Laterality: Right;   TONSILLECTOMY     age 15   Patient Active Problem List   Diagnosis Date Noted   Right spastic hemiplegia (HCC) 12/14/2022   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   CAD in native artery 10/31/2022   Malnutrition of moderate degree 06/22/2022   Closed fracture of femur, neck (HCC) 06/21/2022   Urinary frequency 06/21/2022   Hypokalemia 06/21/2022   Heart murmur 06/21/2022   Hypothyroidism 06/21/2022   Dysphagia 05/17/2022   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   CVA (cerebral vascular accident) (HCC) 05/12/2022   Osteoporosis 01/24/2022   Atypical chest pain 07/19/2021   Temporomandibular  joint (TMJ) pain 07/16/2020   Referred otalgia of both ears 07/16/2020   PAC (premature atrial contraction) 05/11/2020   PVC (premature ventricular contraction) 05/11/2020   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   Carotid bruit 11/13/2019   Pure hypercholesterolemia 11/13/2019   Malignant neoplasm of upper-outer quadrant of right breast in female, estrogen receptor positive (HCC) 05/22/2017   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   Diplopia 09/17/2014   Dizziness and giddiness 09/17/2014   Essential hypertension 07/22/2014   Chest pain 07/22/2014   Family history of heart disease  07/22/2014    REFERRING DIAG:  R29.6 (ICD-10-CM) - Repeated falls, closed fracture of Rt hip    THERAPY DIAG:  Abnormal posture  Muscle weakness (generalized)  Unsteadiness on feet  Other abnormalities of gait and mobility  Rationale for Evaluation and Treatment Rehabilitation  PERTINENT HISTORY: hypertension, breast cancer, hyperlipidemia, Lt hip fracture secondary to fall 06/2022, CVA 06/2022   PRECAUTIONS:  Fall, Rt breast cancer with node removal, osteoporosis    SUBJECTIVE:                                                                                                                                                                                      SUBJECTIVE STATEMENT:  Pt reports that she went to OT earlier this week and she will resume her treatment session.   Pt spouse present during visit.   PAIN:  PAIN:  Are you having pain? Yes NPRS scale: 3-4/10 Pain location: Right shoulder PAIN TYPE: aching Aggravating factors: use Relieving factors: Medication    OBJECTIVE: (objective measures completed at initial evaluation unless otherwise dated)  DIAGNOSTIC FINDINGS: Rt hip fracture and ORIF     COGNITION: Overall cognitive status: Within functional limits for tasks assessed                         SENSATION: WFL  POSTURE: rounded shoulders, forward head, flexed trunk , and weight shift left   PALPATION: NA   LOWER EXTREMITY ROM: Rt hip limited by 50%, hamstrings limited by 50% bil.    LOWER EXTREMITY MMT:   MMT Right eval Right  09/27/22 Left eval Right 10/30/22 Right  01/17/23 Left 10/30/22 Left 01/17/23  Hip flexion 3+ 4- 4- 4- 4 4- 4+  Hip extension           Hip abduction 3-  4    4  Hip adduction           Hip internal rotation           Hip external rotation           Knee flexion 3-  4- 3+  4  4+  Knee extension 3+ 4 4- 4 painful hip 4+ 4- 4+  Ankle dorsiflexion 3+ 4- 4- 4-  4   Ankle plantarflexion           Ankle inversion            Ankle eversion            (Blank rows = not tested)   FUNCTIONAL TESTS:   Eval: Timed up and go (TUG): 1 min, 27 seconds   09/27/22: 5x sit to stand: 29 seconds with use of Lt hand  TUG: 27 seconds   10/30/22: 5x sit to stand: 23 sec use of LT hand     TUG: 26 sec with standard cane   11/20/22: 5x sit to stand: 18.68 seconds    TUG: 19.45 without device    3 minute walk test: 225 feet with a standard cane   01/10/23: 5x sit to stand: 15.77 seconds   01/17/23: TUG with cane: 15.41 seconds with cane    3 min walk test: 313 feet with cane    03/07/23: 3 min walk 275 feet with single point cane   TUG: 16 seconds   03/14/23: 6 min walk test: 723 feet with standard cane    TUG: 14.16 without cane    5x sit to stand: 12.15 seconds without hand   03/28/23 TUG 11 seconds without cane  5x STS 10.6 seconds no Ues  04/18/2023: 6 min walk test:  532 ft with [facility]      TODAY'S TREATMENT:   DATE:  04/25/2023 Nustep level 4 x7 minutes with LE only. PT present throughout to discuss status FWD and lateral step ups with 6" step with unilateral UE support 2x10 bilat-fatigue on 2nd set  Seated modified dead lift with 6# dumbbell x7 Farmers carry 6# L UE from gym mat table to mirror 2 laps Amb 6 min around PT gym- walked over hurdles in the while doing this.  Leg Press (seat at 5) 50# 2x10  DATE:  04/18/2023 Nustep level 4 x7 minutes with LE only. PT present throughout to discuss status FWD and lateral step ups with 6" step with unilateral UE support x10 bilat Seated modified dead lift with 6# dumbbell x7 Farmers carry 6# L UE from gym mat table to mirror 2 laps Amb 6 min around PT gym with [facility] for 532 ft Leg Press (seat at 5) 50# 2x10   DATE:  04/11/2023 Nustep level 4 x 9 minutes with LE only. PT present throughout to discuss status FWD and lateral step ups with 6" step with unilateral UE support x10 bilat Seated modified dead lift with 6# dumbbell x7 Farmers carry 3# L UE from gym mat  table to mirror 2 laps Walk around building with gait belt and cane- SBA by PT for safety Sit to/from stand holding 6# dumbbell 2x10 Standing rocker board PF/DF x2 min 4 square stepping to dots on floor x6 reps Standing at barre performing hip extension x10 bilat  PATIENT EDUCATION:  Education details: Access Code: [email or web address] Person educated: Patient Education method: Programmer, multimedia, Demonstration, and Handouts Education comprehension: verbalized understanding and returned demonstration   HOME EXERCISE PROGRAM: Access Code: [email or web address] URL: [email or web address] Date: 09/27/2022 Prepared by: Tresa Endo  Exercises - Seated Long Arc Quad  - 3 x daily - 7 x weekly - 2 sets - 10 reps - 5 hold - Seated March   - 3 x daily - 7 x weekly - 3 sets -  10 reps - Seated Heel Toe Raises   - 3 x daily - 7 x weekly - 2 sets - 10 reps - Seated Isometric Hip Adduction with Ball  - 3 x daily - 7 x weekly - 2 sets - 10 reps - Sit to Stand with Armchair  - 2 x daily - 7 x weekly - 2 sets - 5-10 reps - Standing Hip Abduction with Counter Support  - 1 x daily - 7 x weekly - 2 sets - 10 reps - Heel Raises with Counter Support  - 2 x daily - 7 x weekly - 2 sets - 10 reps  Patient Education - Walking with a Single [employer] - Modified 4 Point Gait Pattern  ASSESSMENT:   CLINICAL IMPRESSION:      Pt continues to improve endurance, balance and strength.  Pt continues to be challenged by current level of exercise and activity.  Weight was increased on the leg press with bil press and was able to do single leg press with good control.  SBA by PT for safety throughout session. Patient with no loss of balance noted during ambulation during visit today but required more guarding with 2nd set of step-ups due to fatigue.  Patient continues to progress towards goal related activities.  Patient will benefit from skilled PT to address the below impairments and improve overall function.   OBJECTIVE IMPAIRMENTS:  Abnormal gait, decreased activity tolerance, decreased balance, decreased mobility, difficulty walking, decreased strength, decreased safety awareness, impaired perceived functional ability, impaired flexibility, postural dysfunction, and pain.    ACTIVITY LIMITATIONS: carrying, lifting, sitting, standing, stairs, transfers, hygiene/grooming, and locomotion level   PARTICIPATION LIMITATIONS: meal prep, cleaning, laundry, driving, shopping, and community activity   PERSONAL FACTORS: Age, Past/current experiences, and 1-2 comorbidities: CVA, falls, Rt hip fracture with ORIF  are also affecting patient's functional outcome.    REHAB POTENTIAL: Good   CLINICAL DECISION MAKING: Evolving/moderate complexity   EVALUATION COMPLEXITY: Moderate     GOALS: Goals reviewed with patient? Yes   SHORT TERM GOALS: Target date: 09/06/2022   Be independent in initial HEP Baseline: Goal status: Goal met 08/14/22   2.  Improve LE strength to perform sit to stand with moderate Lt UE support Baseline:  Goal status: Goal met 08/28/22  3.  Perform TUG in < or = to 60 seconds to reduce falls risk Baseline: 27 seconds (09/27/22) Goal status: MET      LONG TERM GOALS: Target date:  05/28/23   Be independent in advanced HEP Baseline: independent in current HEP and further progress is needed as she becomes more stable (03/14/23)  Goal status: in progress    2.  Perform TUG in < or = to 13 seconds to reduce falls risk Baseline: 14.16 seconds without cane (03/14/23) Goal status: IN PROGRESS   3.  ambulate > or = to 875 feet in 6 minutes to improve community ambulation and independence  Baseline: 723 feet (03/14/23) Goal status: REVISED    4.  Ambulate with hemi walker with supervision or no guard due to improved gait Baseline: able to do this Goal status: MET   5.  perform 5x sit to stand in < or = to 12 seconds to reduce falls risk  Baseline: 12.15 seconds (03/14/23)  Goal Status: In progress   6.  Ambulate without device for home distances 100% of the time due to improved stability and reduced falls risk  Baseline: using cane 40% when fatigued (03/14/23)  Goal status:  in progress   7. Improve balance to walk her dog short distances with independence   Baseline: not safe to walk independently with her dog (03/14/23)  Goal status: NEW     PLAN:   PT FREQUENCY: 1x/week   PT DURATION: 10 weeks   PLANNED INTERVENTIONS: Therapeutic exercises, Therapeutic activity, Neuromuscular re-education, Balance training, Gait training, Patient/Family education, Self Care, Joint mobilization, Stair training, Dry Needling, Electrical stimulation, Cryotherapy, Moist heat, Taping, Manual therapy, and Re-evaluation   PLAN FOR NEXT SESSION: Continue to work on gait , strength and balance. Increase focus on balance on foam/unsteady surfaces      Lorrene Reid, PT 04/25/23 3:36 PM    [facility] [address], [address] [place], [place] [address] Phone # [phone] Fax [phone]

## 2023-04-26 ENCOUNTER — Ambulatory Visit: Payer: Medicare PPO | Admitting: Occupational Therapy

## 2023-04-26 DIAGNOSIS — M25611 Stiffness of right shoulder, not elsewhere classified: Secondary | ICD-10-CM | POA: Diagnosis not present

## 2023-04-26 DIAGNOSIS — R278 Other lack of coordination: Secondary | ICD-10-CM

## 2023-04-26 DIAGNOSIS — I69351 Hemiplegia and hemiparesis following cerebral infarction affecting right dominant side: Secondary | ICD-10-CM

## 2023-04-26 DIAGNOSIS — M79601 Pain in right arm: Secondary | ICD-10-CM | POA: Diagnosis not present

## 2023-04-26 DIAGNOSIS — M6281 Muscle weakness (generalized): Secondary | ICD-10-CM | POA: Diagnosis not present

## 2023-04-26 DIAGNOSIS — R293 Abnormal posture: Secondary | ICD-10-CM | POA: Diagnosis not present

## 2023-04-26 NOTE — Therapy (Signed)
OUTPATIENT OCCUPATIONAL THERAPY NEURO EVALUATION  Patient Name: Angela Buchanan MRN: [id number] DOB:09-Dec-1950, 72 y.o., female Today's Date: 04/26/2023  PCP: Shon Hale, MD REFERRING PROVIDER: Shon Hale, MD  END OF SESSION:  OT End of Session - 04/26/23 1508     Visit Number 3    Number of Visits 17    Date for OT Re-Evaluation 06/15/23    Authorization Type [employer]    OT Start Time 1445    OT Stop Time 1530    OT Time Calculation (min) 45 min    Behavior During Therapy [facility] for tasks assessed/performed               Past Medical History:  Diagnosis Date   Atypical chest pain 07/19/2021   Bigeminy    no current med.   Breast cancer (HCC) 06/2017   right   Bruises easily    CAD in native artery 10/31/2022   Carotid bruit 11/13/2019   Dental crowns present    History of diverticulitis    Hypertension    states under control with med., has been on med. x 5 yr.   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   Past Surgical History:  Procedure Laterality Date   ABDOMINAL HYSTERECTOMY     partial   BREAST LUMPECTOMY Right 06/22/2017   Malignant   BREAST LUMPECTOMY WITH RADIOACTIVE SEED AND SENTINEL LYMPH NODE BIOPSY Right 06/22/2017   Procedure: RIGHT BREAST LUMPECTOMY WITH RADIOACTIVE SEED AND RIGHT AXILLARY DEEP SENTINEL LYMPH NODE BIOPSY WITH BLUE DYE INJECTION;  Surgeon: Claud Kelp, MD;  Location: [facility];  Service: General;  Laterality: Right;   BREAST LUMPECTOMY WITH RADIOACTIVE SEED LOCALIZATION Left 06/22/2017   Procedure: LEFT BREAST LUMPECTOMY WITH RADIOACTIVE SEED LOCALIZATION;  Surgeon: Claud Kelp, MD;  Location: [facility];  Service:  General;  Laterality: Left;   CATARACT EXTRACTION W/ INTRAOCULAR LENS  IMPLANT, BILATERAL Bilateral    EXCISION OF BREAST BIOPSY Left 06/22/2017   benign   LOOP RECORDER INSERTION N/A 05/16/2022   Procedure: LOOP RECORDER INSERTION;  Surgeon: Lanier Prude, MD;  Location: [facility];  Service: Cardiovascular;  Laterality: N/A;   PERCUTANEOUS PINNING Right 06/22/2022   Procedure: PERCUTANEOUS PINNING OF RIGHT HIP;  Surgeon: Bjorn Pippin, MD;  Location: [facility];  Service: Orthopedics;  Laterality: Right;   TONSILLECTOMY     age 23   Patient Active Problem List   Diagnosis Date Noted   Right spastic hemiplegia (HCC) 12/14/2022   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 05/11/2020   PVC (premature ventricular contraction) 05/11/2020   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   Carotid bruit 11/13/2019   Pure hypercholesterolemia 11/13/2019   Malignant neoplasm of upper-outer  quadrant of right breast in female, estrogen receptor positive (HCC) 05/22/2017   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   Diplopia 09/17/2014   Dizziness and giddiness 09/17/2014   Essential hypertension 07/22/2014   Chest pain 07/22/2014   Family history of heart disease 07/22/2014    ONSET DATE: 05/13/22 (new referral 04/09/23)  REFERRING DIAG: G83.23 (ICD-10-CM) - 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  THERAPY DIAG:  Hemiplegia and  hemiparesis following cerebral infarction affecting right dominant side (HCC)  Stiffness of right shoulder, not elsewhere classified  Other lack of coordination  Pain in right arm  Muscle weakness (generalized)  Rationale for Evaluation and Treatment: Rehabilitation  SUBJECTIVE:   SUBJECTIVE STATEMENT: Pt reports "I've been practicing, and practicing, and practicing." Pt accompanied by: self and significant other ([name])  PERTINENT HISTORY: hypertension, breast cancer, hyperlipidemia, Lt hip fracture secondary to fall 06/2022, CVA 05/2022    PRECAUTIONS: Fall, Rt breast cancer with node removal, osteoporosis   WEIGHT BEARING RESTRICTIONS: No  PAIN:  Are you having pain? No  FALLS: Has patient fallen in last 6 months? No  LIVING ENVIRONMENT: Lives with: lives with their spouse Lives in: House/apartment Stairs: Yes: External: 2 steps; on left going up Has following equipment at home: Quad cane large base, Environmental consultant - 2 wheeled, Hemi walker, Wheelchair (manual), shower chair, and Grab bars  PLOF: Independent and Independent with basic ADLs  PATIENT GOALS: to have better use my arm and hand  OBJECTIVE:   HAND DOMINANCE: Right  ADLs:  Transfers/ambulation related to ADLs: Utilizes [facility] for mobility in the community, 80% in the house but will walk without occasionally Eating: husband cuts foods, utilizing built up handles for self-feeding Grooming: needs assistance with washing hair UB Dressing: needs assistance with fastening bra LB Dressing: Mod-max assist for donning pants due to fear of falling, able to don socks and slip on shoes - unable to tie shoes Toileting: Mod I Bathing: Supervision Tub Shower transfers: Supervision, but able to complete transfers with use of grab bars Equipment: Shower seat with back, Grab bars, and Walk in shower  IADLs: Handwriting:  TBA at next session  MOBILITY STATUS: Needs Assist: utilized [facility] for mobility and Hx of falls  POSTURE  COMMENTS:  rounded shoulders and forward head  ACTIVITY TOLERANCE: Activity tolerance: fatigues with increased mobility and effort  FUNCTIONAL OUTCOME MEASURES: Upper Extremity Functional Scale (UEFS): TBD  UPPER EXTREMITY ROM:    Active ROM Right eval Left eval  Shoulder flexion 87   Shoulder abduction 78   Shoulder adduction    Shoulder extension    Shoulder internal rotation 90%   Shoulder external rotation 50%   Elbow flexion 132   Elbow extension -17   Wrist flexion 19   Wrist extension 35   Wrist ulnar deviation 15   Wrist radial deviation 13   Wrist pronation WNL   Wrist supination 20 from neutral   (Blank rows = not tested)  UPPER EXTREMITY MMT:     MMT Right eval Left eval  Shoulder flexion    Shoulder abduction    Shoulder adduction    Shoulder extension    Shoulder internal rotation    Shoulder external rotation    Middle trapezius    Lower trapezius    Elbow flexion    Elbow extension    Wrist flexion    Wrist extension    Wrist ulnar deviation    Wrist radial deviation    Wrist pronation  Wrist supination    (Blank rows = not tested)    Active ROM Extension (as flexion to loose gross grasp) Right eval Left 04/26/2023  Thumb MCP (0-60)    Thumb IP (0-80)    Thumb Radial abd/add (0-55)    Thumb Palmar abd/add (0-45)    Thumb opposition to index    Index MCP (0-90) 0   Index PIP (0-100) -30   Index DIP (0-70) -40   Long MCP (0-90) -5   Long PIP (0-100) -45   Long DIP (0-70) -5   Ring MCP (0-90_ 0   Ring PIP (0-100) -40   Ring DIP (0-70) -20   Little MCP (0-90) -7   Little PIP (0-100) -25   Little DIP (0-70) -5   (Blank rows = not tested)    HAND FUNCTION: Grip strength: Right: 7,9 = 8# lbs; Left: 30 lbs  COORDINATION: Finger Nose Finger test: slow and deliberate with RUE, but able to touch both nose and finger at this time 9 Hole Peg test: Right: unable to pick up any pegs with RUE  Box and Blocks:  Right 16 blocks, Left  55 blocks  SENSATION: Light touch: WFL Hot/Cold: WFL  EDEMA: no swelling on eval  MUSCLE TONE: RUE: Mild and Hypertonic  COGNITION: Overall cognitive status: Within functional limits for tasks assessed  VISION: Subjective report: pt reports wearing glasses more since CVA Baseline vision: Wears glasses all the time, double vision after cataract removal Visual history: cataracts  OBSERVATIONS: Pt attempting grasp and pinch activities, frequently not incorporating index finger on R with increased flexion away from task.   TODAY'S TREATMENT:                                                           04/26/23 UE ROM: engaged in wrist extension with prayer stretch with focus on maintaining palms together. Increased ROM with wrist flexion/extension with side to side movements.  OT instructed pt in thumb abduction with attempts at AROM and then PROM to facilitate increased ROM and stretch.  Pt demonstrating trace to mild activation with abduction/adduction.  Attempted opening to grip towel, however pt requiring manual opening of thumb but then able to squeeze and loosely release grasp on towel.  Engaged in PROM with finger extension and composite extension, OT providing tactile cues and verbal cues for hand placement to further facilitate ROM.  OT encouraged gross grasp and release without any item in hand to further facilitate increased flexion, with primary focus on index finger.  Reviewed completion of activities with arm rested on table in neutral position to allow for increased flexion/extension.  Exercises - Thumb Radial Abduction PROM  - 2-3 x daily - 10 reps - 5 sec hold - Hand PROM Finger Extension  - 2-3 x daily - 5 reps - 15-20 sec hold - Seated Finger MP Extension PROM  - 2-3 x daily - 5 reps - 5 sec hold - Seated Finger Composite Flexion Extension  - 2-3 x daily - 10 reps - Seated Gripping Towel  - 2-3 x daily - 10 reps - 5 sec hold Self-feeding: 44.43 sec without built up handle.   OT added handle, however pt then demonstrating increased lean and shoulder hike, therefore slower.  OT reiterated to pt encouragement for upright sitting posture  and to observe for compensatory strategies (that may not be helping!).    04/23/23 Gross grasp/release: stacking and unstacking cones in various planes with focus on shoulder flexion, elbow extension, wrist flexion/extension and radial/ulnar deviation as needed to pick up and grasp cones.  OT changing planes to facilitate increased reach and challenge ROM.   UE ROM: engaged in supination stretch, radial/ulnar deviation, wrist flexion/extension, and prayer stretch for further wrist and finger extension.  OT providing demonstration and modifications to setup to facilitate increased ROM and stretch.  Pt demonstrating supination to neutral, therefore encouraged in PROM/stretch as well as prayer stretch for further wrist extension. Exercises - Forearm Supination PROM  - 2-3 x daily - 10 reps - 5 sec hold - Wrist Flexion and Extension With Arm Supported  - 2-3 x daily - 10 reps - Wrist Radial Deviation AROM  - 2-3 x daily - 10 reps - Seated Wrist Prayer Stretch  - 2-3 x daily - 10 reps - 5 sec hold   04/16/23  Engaged in massed practice with picking up small items to facilitate increased integration of index finger into pinch.   PATIENT EDUCATION: Education details: ongoing condition specific education. Person educated: Patient and Spouse Education method: Explanation, Demonstration, and Verbal cues Education comprehension: verbalized understanding and needs further education  HOME EXERCISE PROGRAM: Access Code: [email or web address] URL: [email or web address] Date: 04/23/2023 Prepared by: [facility] - [facility]    GOALS: Goals reviewed with patient? Yes  SHORT TERM GOALS: Target date: 05/18/23  Pt and spouse will be independent with HEP for ROM, strengthening, and coordination.  Baseline: Goal status:  IN PROGRESS  2.  Pt will verbalize understanding of task modifications and/or potential AE needs to increase ease, safety, and independence w/ ADLs.  Baseline: Goal status: IN PROGRESS  LONG TERM GOALS: Target date: 06/15/23  Pt will demonstrate improved functional grasp in RUE by 6# to open new jar, increased handwriting, and self-feeding.  Baseline: R: 8#, L: 30# Goal status: IN PROGRESS  2.  Pt will demonstrate improved UE functional use for ADLs as evidenced by increasing box/ blocks score by 6 blocks with RUE. Baseline: R: 16 blocks, L: 55 blocks Goal status: IN PROGRESS  3.  Pt will demonstrate improved shoulder ROM (shoulder flexion and external rotation) as needed for ADLs/IADLs (washing hair, putting on jacket, etc).  Baseline:  Goal status: IN PROGRESS  4.  Pt will demonstrate improvement of finger extension by 15* TAM of index and long ringer finger to allow for more functional hand opening.  Baseline: -70 IF, -55 LF Goal status: IN PROGRESS  5.  Pt will demonstrate improved handwriting with ability to write name and simple grocery list with 90% legibility. Baseline:  Goal status: IN PROGRESS  6.  Pt will demonstrate improvements in functional use of dominant RUE as evidenced by improvements on FOTO. Baseline: TBD Goal status: IN PROGRESS  ASSESSMENT:  CLINICAL IMPRESSION: Pt benefiting from cues for positioning of trunk as well as forearm during wrist and finger ROM as well as during functional tasks with self-feeding.  Pt continues to demonstrate trunk lean and R shoulder hike during more intricate movements.     PERFORMANCE DEFICITS: in functional skills including ADLs, IADLs, coordination, dexterity, tone, ROM, strength, pain, flexibility, Fine motor control, Gross motor control, mobility, balance, body mechanics, endurance, decreased knowledge of precautions, decreased knowledge of use of DME, and UE functional use and psychosocial skills including environmental  adaptation and routines and behaviors.  IMPAIRMENTS: are limiting patient from ADLs and IADLs.   CO-MORBIDITIES: may have co-morbidities  that affects occupational performance. Patient will benefit from skilled OT to address above impairments and improve overall function.  MODIFICATION OR ASSISTANCE TO COMPLETE EVALUATION: Min-Moderate modification of tasks or assist with assess necessary to complete an evaluation.  OT OCCUPATIONAL PROFILE AND HISTORY: Detailed assessment: Review of records and additional review of physical, cognitive, psychosocial history related to current functional performance.  CLINICAL DECISION MAKING: Moderate - several treatment options, min-mod task modification necessary  REHAB POTENTIAL: Good  EVALUATION COMPLEXITY: Moderate    PLAN:  OT FREQUENCY: 2x/week  OT DURATION: 8 weeks  PLANNED INTERVENTIONS: self care/ADL training, therapeutic exercise, therapeutic activity, neuromuscular re-education, manual therapy, passive range of motion, functional mobility training, splinting, electrical stimulation, ultrasound, compression bandaging, moist heat, cryotherapy, patient/family education, psychosocial skills training, energy conservation, coping strategies training, and DME and/or AE instructions  RECOMMENDED OTHER SERVICES: NA  CONSULTED AND AGREED WITH PLAN OF CARE: Patient and family member/caregiver  PLAN FOR NEXT SESSION: assess handwriting, complete UEFS or FOTO, initiate coordination (pen tricks) and/or finger flexion and extension HEP     Niylah Hassan, OT 04/26/2023, 3:09 PM

## 2023-04-27 DIAGNOSIS — E440 Moderate protein-calorie malnutrition: Secondary | ICD-10-CM | POA: Diagnosis not present

## 2023-04-27 DIAGNOSIS — I25119 Atherosclerotic heart disease of native coronary artery with unspecified angina pectoris: Secondary | ICD-10-CM | POA: Diagnosis not present

## 2023-04-27 DIAGNOSIS — Z0000 Encounter for general adult medical examination without abnormal findings: Secondary | ICD-10-CM | POA: Diagnosis not present

## 2023-04-27 DIAGNOSIS — E538 Deficiency of other specified B group vitamins: Secondary | ICD-10-CM | POA: Diagnosis not present

## 2023-04-27 DIAGNOSIS — F321 Major depressive disorder, single episode, moderate: Secondary | ICD-10-CM | POA: Diagnosis not present

## 2023-04-27 DIAGNOSIS — E039 Hypothyroidism, unspecified: Secondary | ICD-10-CM | POA: Diagnosis not present

## 2023-04-27 DIAGNOSIS — I700 Atherosclerosis of aorta: Secondary | ICD-10-CM | POA: Diagnosis not present

## 2023-04-27 DIAGNOSIS — R7303 Prediabetes: Secondary | ICD-10-CM | POA: Diagnosis not present

## 2023-04-27 DIAGNOSIS — E559 Vitamin D deficiency, unspecified: Secondary | ICD-10-CM | POA: Diagnosis not present

## 2023-04-30 ENCOUNTER — Ambulatory Visit (INDEPENDENT_AMBULATORY_CARE_PROVIDER_SITE_OTHER): Payer: Medicare PPO

## 2023-04-30 DIAGNOSIS — I639 Cerebral infarction, unspecified: Secondary | ICD-10-CM

## 2023-04-30 DIAGNOSIS — Z1211 Encounter for screening for malignant neoplasm of colon: Secondary | ICD-10-CM | POA: Diagnosis not present

## 2023-04-30 LAB — CUP PACEART REMOTE DEVICE CHECK
Date Time Interrogation Session: 20240729001400
Implantable Pulse Generator Implant Date: 20230815
Pulse Gen Serial Number: 183424

## 2023-05-01 DIAGNOSIS — H50011 Monocular esotropia, right eye: Secondary | ICD-10-CM | POA: Diagnosis not present

## 2023-05-01 DIAGNOSIS — H5000 Unspecified esotropia: Secondary | ICD-10-CM | POA: Diagnosis not present

## 2023-05-02 ENCOUNTER — Telehealth (HOSPITAL_COMMUNITY): Payer: Self-pay | Admitting: *Deleted

## 2023-05-02 NOTE — Telephone Encounter (Signed)
Spoke with patient and gave detailed instructions about her test on 05/04/23 at 12:45. Patient did have questions about the cost. She was uncertain what she needed to pay. May need to reschedule. She will decided before FRIDAY.

## 2023-05-03 ENCOUNTER — Ambulatory Visit: Payer: Medicare PPO | Attending: Family Medicine | Admitting: Occupational Therapy

## 2023-05-03 ENCOUNTER — Ambulatory Visit: Payer: Medicare PPO | Attending: Family Medicine

## 2023-05-03 DIAGNOSIS — M6281 Muscle weakness (generalized): Secondary | ICD-10-CM | POA: Insufficient documentation

## 2023-05-03 DIAGNOSIS — R2689 Other abnormalities of gait and mobility: Secondary | ICD-10-CM | POA: Diagnosis not present

## 2023-05-03 DIAGNOSIS — R2681 Unsteadiness on feet: Secondary | ICD-10-CM | POA: Insufficient documentation

## 2023-05-03 DIAGNOSIS — M25611 Stiffness of right shoulder, not elsewhere classified: Secondary | ICD-10-CM | POA: Diagnosis not present

## 2023-05-03 DIAGNOSIS — R278 Other lack of coordination: Secondary | ICD-10-CM | POA: Diagnosis not present

## 2023-05-03 DIAGNOSIS — I69351 Hemiplegia and hemiparesis following cerebral infarction affecting right dominant side: Secondary | ICD-10-CM | POA: Insufficient documentation

## 2023-05-03 NOTE — Therapy (Signed)
OUTPATIENT OCCUPATIONAL THERAPY NEURO EVALUATION  Patient Name: Angela Buchanan MRN: [id number] DOB:[birth year], 72 y.o., female Today's Date: 05/03/2023  PCP: Shon Hale, MD REFERRING PROVIDER: Shon Hale, MD  END OF SESSION:  OT End of Session - 05/03/23 1328     Visit Number 4    Number of Visits 17    Date for OT Re-Evaluation 06/15/23    Authorization Type [employer]    OT Start Time 1325   arrival from a different appt   OT Stop Time 1404    OT Time Calculation (min) 39 min    Behavior During Therapy [facility] for tasks assessed/performed                Past Medical History:  Diagnosis Date   Atypical chest pain 07/19/2021   Bigeminy    no current med.   Breast cancer (HCC) 06/2017   right   Bruises easily    CAD in native artery 10/31/2022   Carotid bruit 11/13/2019   Dental crowns present    History of diverticulitis    Hypertension    states under control with med., has been on med. x 5 yr.   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   Past Surgical History:  Procedure Laterality Date   ABDOMINAL HYSTERECTOMY     partial   BREAST LUMPECTOMY Right 06/22/2017   Malignant   BREAST LUMPECTOMY WITH RADIOACTIVE SEED AND SENTINEL LYMPH NODE BIOPSY Right 06/22/2017   Procedure: RIGHT BREAST LUMPECTOMY WITH RADIOACTIVE SEED AND RIGHT AXILLARY DEEP SENTINEL LYMPH NODE BIOPSY WITH BLUE DYE INJECTION;  Surgeon: Claud Kelp, MD;  Location: [facility];  Service: General;  Laterality: Right;   BREAST LUMPECTOMY WITH RADIOACTIVE SEED LOCALIZATION Left 06/22/2017   Procedure: LEFT BREAST LUMPECTOMY WITH RADIOACTIVE SEED LOCALIZATION;  Surgeon: Claud Kelp, MD;  Location: [facility];  Service: General;  Laterality: Left;   CATARACT EXTRACTION W/ INTRAOCULAR LENS  IMPLANT, BILATERAL Bilateral    EXCISION OF BREAST BIOPSY Left 06/22/2017   benign   LOOP RECORDER INSERTION N/A 05/16/2022   Procedure: LOOP RECORDER INSERTION;  Surgeon: Lanier Prude, MD;  Location: [facility];  Service: Cardiovascular;  Laterality: N/A;   PERCUTANEOUS PINNING Right 06/22/2022   Procedure: PERCUTANEOUS PINNING OF RIGHT HIP;  Surgeon: Bjorn Pippin, MD;  Location: [facility];  Service: Orthopedics;  Laterality: Right;   TONSILLECTOMY     age 35   Patient Active Problem List   Diagnosis Date Noted   Right spastic hemiplegia (HCC) 12/14/2022   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 05/11/2020   PVC (premature ventricular contraction) 05/11/2020   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   Carotid bruit 11/13/2019   Pure hypercholesterolemia  11/13/2019   Malignant neoplasm of upper-outer quadrant of right breast in female, estrogen receptor positive (HCC) 05/22/2017   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   Diplopia 09/17/2014   Dizziness and giddiness 09/17/2014   Essential hypertension 07/22/2014   Chest pain 07/22/2014   Family history of heart disease 07/22/2014    ONSET DATE: 05/13/22 (new referral 04/09/23)  REFERRING DIAG: G83.23 (ICD-10-CM) - 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  Pt accompanied by: self and significant other ([name])  PERTINENT HISTORY: hypertension, breast cancer, hyperlipidemia, Lt hip fracture secondary to fall 06/2022, CVA 05/2022    PRECAUTIONS: Fall, Rt breast cancer with node removal, osteoporosis   WEIGHT BEARING RESTRICTIONS: No  PAIN:  Are you having pain? No  FALLS: Has patient fallen in last 6 months? No  LIVING ENVIRONMENT: Lives with: lives with their spouse Lives in: House/apartment Stairs: Yes: External: 2 steps; on left going up Has following equipment at home: Quad cane large base, Environmental consultant - 2 wheeled, Hemi walker, Wheelchair (manual), shower chair, and Grab bars  PLOF: Independent and Independent with basic ADLs  PATIENT GOALS: to have better use my arm and hand  OBJECTIVE:   HAND DOMINANCE: Right  ADLs:  Transfers/ambulation related to ADLs: Utilizes [facility] for mobility in the community, 80% in the house but will walk without occasionally Eating: husband cuts foods, utilizing built up handles for self-feeding Grooming: needs assistance with washing hair UB Dressing: needs assistance with fastening bra LB Dressing: Mod-max assist for donning pants due to fear of falling, able to don socks and slip on shoes - unable to tie shoes Toileting: Mod I Bathing: Supervision Tub Shower transfers: Supervision, but able to complete transfers with use of grab bars Equipment: Shower seat with back, Grab bars, and Walk in shower  IADLs: Handwriting:  TBA at next session  MOBILITY STATUS: Needs Assist: utilized [facility] for mobility and Hx of falls  POSTURE COMMENTS:   rounded shoulders and forward head  ACTIVITY TOLERANCE: Activity tolerance: fatigues with increased mobility and effort  FUNCTIONAL OUTCOME MEASURES: Upper Extremity Functional Scale (UEFS): TBD  UPPER EXTREMITY ROM:    Active ROM Right eval Left eval  Shoulder flexion 87   Shoulder abduction 78   Shoulder adduction    Shoulder extension    Shoulder internal rotation 90%   Shoulder external rotation 50%   Elbow flexion 132   Elbow extension -17   Wrist flexion 19   Wrist extension 35   Wrist ulnar deviation 15   Wrist radial deviation 13   Wrist pronation WNL   Wrist supination 20 from neutral   (Blank rows = not tested)  UPPER EXTREMITY MMT:     MMT Right eval Left eval  Shoulder flexion    Shoulder abduction    Shoulder adduction    Shoulder extension    Shoulder internal rotation    Shoulder external rotation    Middle trapezius    Lower trapezius    Elbow flexion    Elbow extension    Wrist flexion    Wrist extension    Wrist ulnar deviation    Wrist radial deviation    Wrist pronation  Wrist supination    (Blank rows = not tested)    Active ROM Extension (as flexion to loose gross grasp) Right eval Left 05/03/2023  Thumb MCP (0-60)    Thumb IP (0-80)    Thumb Radial abd/add (0-55)    Thumb Palmar abd/add (0-45)    Thumb opposition to index    Index MCP (0-90) 0   Index PIP (0-100) -30   Index DIP (0-70) -40   Long MCP (0-90) -5   Long PIP (0-100) -45   Long DIP (0-70) -5   Ring MCP (0-90_ 0   Ring PIP (0-100) -40   Ring DIP (0-70) -20   Little MCP (0-90) -7   Little PIP (0-100) -25   Little DIP (0-70) -5   (Blank rows = not tested)    HAND FUNCTION: Grip strength: Right: 7,9 = 8# lbs; Left: 30 lbs  COORDINATION: Finger Nose Finger test: slow and deliberate with RUE, but able to touch both nose and finger at this time 9 Hole Peg test: Right: unable to pick up any pegs with RUE  Box and Blocks:  Right 16 blocks, Left 55  blocks  SENSATION: Light touch: WFL Hot/Cold: WFL  EDEMA: no swelling on eval  MUSCLE TONE: RUE: Mild and Hypertonic  COGNITION: Overall cognitive status: Within functional limits for tasks assessed  VISION: Subjective report: pt reports wearing glasses more since CVA Baseline vision: Wears glasses all the time, double vision after cataract removal Visual history: cataracts  OBSERVATIONS: Pt attempting grasp and pinch activities, frequently not incorporating index finger on R with increased flexion away from task.   TODAY'S TREATMENT:                                                           05/03/23 Peg board: engaged in picking up large grip pegs from table top and rotating in palm of hands (unable to rotate in finger tips) to place into resistive peg board for coordination and strength.  Pt demonstrating loose gross grasp, index finger still not overly engaged in grasp, however utilizing with min cues to rotate peg into place. Pt then removing pegs to challenge grip strength and then reaching across midline to place into container with small opening for increased precision. Handwriting: engaged in writing name x3 with use of large built up foam handle on pen.  Pt continues to demonstrate decreased sustained grasp and motor control to write without foam handle. Coordination/UE ROM: Attempted pen tricks, however unable to rotate pen in between fingers, therefore transitioned to blocked DIP and PIP flexion and extension of index finger.  OT providing assistance and cues for setup and blocking at each joint, fading to pt completing with only initial cues.  Exercises - Seated Isolated Finger PIP Flexion AROM  - 2-3 x daily - 10 reps - Seated Finger DIP Extension PROM  - 2-3 x daily - 10 reps - 5 hold - Seated Finger PIP Extension PROM  - 2-3 x daily - 10 reps - 5 hold   04/26/23 UE ROM: engaged in wrist extension with prayer stretch with focus on maintaining palms together. Increased ROM  with wrist flexion/extension with side to side movements.  OT instructed pt in thumb abduction with attempts at AROM and then PROM to facilitate increased ROM and stretch.  Pt demonstrating trace to mild activation with abduction/adduction.  Attempted opening to grip towel, however pt requiring manual opening of thumb but then able to squeeze and loosely release grasp on towel.  Engaged in PROM with finger extension and composite extension, OT providing tactile cues and verbal cues for hand placement to further facilitate ROM.  OT encouraged gross grasp and release without any item in hand to further facilitate increased flexion, with primary focus on index finger.  Reviewed completion of activities with arm rested on table in neutral position to allow for increased flexion/extension.  Exercises - Thumb Radial Abduction PROM  - 2-3 x daily - 10 reps - 5 sec hold - Hand PROM Finger Extension  - 2-3 x daily - 5 reps - 15-20 sec hold - Seated Finger MP Extension PROM  - 2-3 x daily - 5 reps - 5 sec hold - Seated Finger Composite Flexion Extension  - 2-3 x daily - 10 reps - Seated Gripping Towel  - 2-3 x daily - 10 reps - 5 sec hold Self-feeding: 44.43 sec without built up handle.  OT added handle, however pt then demonstrating increased lean and shoulder hike, therefore slower.  OT reiterated to pt encouragement for upright sitting posture and to observe for compensatory strategies (that may not be helping!).    04/23/23 Gross grasp/release: stacking and unstacking cones in various planes with focus on shoulder flexion, elbow extension, wrist flexion/extension and radial/ulnar deviation as needed to pick up and grasp cones.  OT changing planes to facilitate increased reach and challenge ROM.   UE ROM: engaged in supination stretch, radial/ulnar deviation, wrist flexion/extension, and prayer stretch for further wrist and finger extension.  OT providing demonstration and modifications to setup to facilitate  increased ROM and stretch.  Pt demonstrating supination to neutral, therefore encouraged in PROM/stretch as well as prayer stretch for further wrist extension. Exercises - Forearm Supination PROM  - 2-3 x daily - 10 reps - 5 sec hold - Wrist Flexion and Extension With Arm Supported  - 2-3 x daily - 10 reps - Wrist Radial Deviation AROM  - 2-3 x daily - 10 reps - Seated Wrist Prayer Stretch  - 2-3 x daily - 10 reps - 5 sec hold  PATIENT EDUCATION: Education details: ongoing condition specific education. Person educated: Patient and Spouse Education method: Explanation, Demonstration, and Verbal cues Education comprehension: verbalized understanding and needs further education  HOME EXERCISE PROGRAM: Access Code: [email or web address] URL: [email or web address] Date: 04/23/2023 Prepared by: [facility] - Outpatient  Rehab - [facility]    GOALS: Goals reviewed with patient? Yes  SHORT TERM GOALS: Target date: 05/18/23  Pt and spouse will be independent with HEP for ROM, strengthening, and coordination.  Baseline: Goal status: IN PROGRESS  2.  Pt will verbalize understanding of task modifications and/or potential AE needs to increase ease, safety, and independence w/ ADLs.  Baseline: Goal status: IN PROGRESS  LONG TERM GOALS: Target date: 06/15/23  Pt will demonstrate improved functional grasp in RUE by 6# to open new jar, increased handwriting, and self-feeding.  Baseline: R: 8#, L: 30# Goal status: IN PROGRESS  2.  Pt will demonstrate improved UE functional use for ADLs as evidenced by increasing box/ blocks score by 6 blocks with RUE. Baseline: R: 16 blocks, L: 55 blocks Goal status: IN PROGRESS  3.  Pt will demonstrate improved shoulder ROM (shoulder flexion and external rotation) as needed for ADLs/IADLs (washing hair, putting on jacket, etc).  Baseline:  Goal  status: IN PROGRESS  4.  Pt will demonstrate improvement of finger extension by 15* TAM of index and long  ringer finger to allow for more functional hand opening.  Baseline: -70 IF, -55 LF Goal status: IN PROGRESS  5.  Pt will demonstrate improved handwriting with ability to write name and simple grocery list with 90% legibility. Baseline:  Goal status: IN PROGRESS  6.  Pt will demonstrate improvements in functional use of dominant RUE as evidenced by improvements on FOTO. Baseline: TBD Goal status: IN PROGRESS  ASSESSMENT:  CLINICAL IMPRESSION: Pt benefiting from cues for positioning of trunk as well as forearm during peg board activity, especially when reaching across midline.  OT providing mod fading to min cues for technique with DIP and PIP flexion/extension and providing written cues for spouse to assist as needed.    PERFORMANCE DEFICITS: in functional skills including ADLs, IADLs, coordination, dexterity, tone, ROM, strength, pain, flexibility, Fine motor control, Gross motor control, mobility, balance, body mechanics, endurance, decreased knowledge of precautions, decreased knowledge of use of DME, and UE functional use and psychosocial skills including environmental adaptation and routines and behaviors.   IMPAIRMENTS: are limiting patient from ADLs and IADLs.   CO-MORBIDITIES: may have co-morbidities  that affects occupational performance. Patient will benefit from skilled OT to address above impairments and improve overall function.  MODIFICATION OR ASSISTANCE TO COMPLETE EVALUATION: Min-Moderate modification of tasks or assist with assess necessary to complete an evaluation.  OT OCCUPATIONAL PROFILE AND HISTORY: Detailed assessment: Review of records and additional review of physical, cognitive, psychosocial history related to current functional performance.  CLINICAL DECISION MAKING: Moderate - several treatment options, min-mod task modification necessary  REHAB POTENTIAL: Good  EVALUATION COMPLEXITY: Moderate    PLAN:  OT FREQUENCY: 2x/week  OT DURATION: 8  weeks  PLANNED INTERVENTIONS: self care/ADL training, therapeutic exercise, therapeutic activity, neuromuscular re-education, manual therapy, passive range of motion, functional mobility training, splinting, electrical stimulation, ultrasound, compression bandaging, moist heat, cryotherapy, patient/family education, psychosocial skills training, energy conservation, coping strategies training, and DME and/or AE instructions  RECOMMENDED OTHER SERVICES: NA  CONSULTED AND AGREED WITH PLAN OF CARE: Patient and family member/caregiver  PLAN FOR NEXT SESSION: complete UEFS or FOTO, initiate gross grasp tasks, further address wrist flexion/extension, supination, and finger flexion and extension     Female Minish, OTR/L 05/03/2023, 3:45 PM

## 2023-05-03 NOTE — Therapy (Signed)
OUTPATIENT PHYSICAL THERAPY TREATMENT NOTE   Patient Name: Angela Buchanan MRN: [id number] DOB:1951-05-22, 72 y.o., female Today's Date: 05/03/2023  PCP:  Garth Bigness, MD   REFERRING PROVIDER:  Garth Bigness, MD      END OF SESSION:   PT End of Session - 05/03/23 1315     Visit Number 35    Date for PT Re-Evaluation 05/28/23    Authorization Type [employer]    Authorization - Visit Number 7    Authorization - Number of Visits 10    Progress Note Due on Visit 40    PT Start Time 1233    PT Stop Time 1315    PT Time Calculation (min) 42 min    Activity Tolerance Patient tolerated treatment well    Behavior During Therapy [facility] for tasks assessed/performed                  Past Medical History:  Diagnosis Date   Atypical chest pain 07/19/2021   Bigeminy    no current med.   Breast cancer (HCC) 06/2017   right   Bruises easily    CAD in native artery 10/31/2022   Carotid bruit 11/13/2019   Dental crowns present    History of diverticulitis    Hypertension    states under control with med., has been on med. x 5 yr.   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   Past Surgical History:  Procedure Laterality Date   ABDOMINAL HYSTERECTOMY     partial   BREAST LUMPECTOMY Right 06/22/2017   Malignant   BREAST LUMPECTOMY WITH RADIOACTIVE SEED AND SENTINEL LYMPH NODE BIOPSY Right 06/22/2017   Procedure: RIGHT BREAST LUMPECTOMY WITH RADIOACTIVE SEED AND RIGHT AXILLARY DEEP SENTINEL LYMPH NODE BIOPSY WITH BLUE DYE INJECTION;  Surgeon: Claud Kelp, MD;  Location: [facility];  Service: General;  Laterality: Right;   BREAST LUMPECTOMY WITH RADIOACTIVE SEED LOCALIZATION Left 06/22/2017    Procedure: LEFT BREAST LUMPECTOMY WITH RADIOACTIVE SEED LOCALIZATION;  Surgeon: Claud Kelp, MD;  Location: [facility];  Service: General;  Laterality: Left;   CATARACT EXTRACTION W/ INTRAOCULAR LENS  IMPLANT, BILATERAL Bilateral    EXCISION OF BREAST BIOPSY Left 06/22/2017   benign   LOOP RECORDER INSERTION N/A 05/16/2022   Procedure: LOOP RECORDER INSERTION;  Surgeon: Lanier Prude, MD;  Location: [facility];  Service: Cardiovascular;  Laterality: N/A;   PERCUTANEOUS PINNING Right 06/22/2022   Procedure: PERCUTANEOUS PINNING OF RIGHT HIP;  Surgeon: Bjorn Pippin, MD;  Location: [facility];  Service: Orthopedics;  Laterality: Right;   TONSILLECTOMY     age 68   Patient Active Problem List   Diagnosis Date Noted   Right spastic hemiplegia (HCC) 12/14/2022   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   CAD in native artery 10/31/2022   Malnutrition of moderate degree 06/22/2022   Closed fracture of femur, neck (HCC) 06/21/2022   Urinary frequency 06/21/2022   Hypokalemia 06/21/2022   Heart murmur 06/21/2022   Hypothyroidism 06/21/2022   Dysphagia 05/17/2022   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   CVA (cerebral vascular accident) (HCC) 05/12/2022   Osteoporosis 01/24/2022   Atypical chest pain 07/19/2021   Temporomandibular joint (TMJ) pain 07/16/2020   Referred otalgia  of both ears 07/16/2020   PAC (premature atrial contraction) 05/11/2020   PVC (premature ventricular contraction) 05/11/2020   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   Carotid bruit 11/13/2019   Pure hypercholesterolemia 11/13/2019   Malignant neoplasm of upper-outer quadrant of right breast in female, estrogen receptor positive (HCC) 05/22/2017   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   Diplopia 09/17/2014   Dizziness and giddiness 09/17/2014   Essential hypertension 07/22/2014   Chest pain 07/22/2014   Family history of heart disease 07/22/2014    REFERRING DIAG:  R29.6  (ICD-10-CM) - Repeated falls, closed fracture of Rt hip    THERAPY DIAG:  Muscle weakness (generalized)  Unsteadiness on feet  Other abnormalities of gait and mobility  Rationale for Evaluation and Treatment Rehabilitation  PERTINENT HISTORY: hypertension, breast cancer, hyperlipidemia, Lt hip fracture secondary to fall 06/2022, CVA 06/2022   PRECAUTIONS:  Fall, Rt breast cancer with node removal, osteoporosis    SUBJECTIVE:                                                                                                                                                                                      SUBJECTIVE STATEMENT:  Pt reports that she went to OT earlier this week and she will resume her treatment session.   Pt spouse present during visit.   PAIN:  PAIN:  Are you having pain? Yes NPRS scale: 3-4/10 Pain location: Right shoulder PAIN TYPE: aching Aggravating factors: use Relieving factors: Medication    OBJECTIVE: (objective measures completed at initial evaluation unless otherwise dated)  DIAGNOSTIC FINDINGS: Rt hip fracture and ORIF     COGNITION: Overall cognitive status: Within functional limits for tasks assessed                         SENSATION: WFL  POSTURE: rounded shoulders, forward head, flexed trunk , and weight shift left   PALPATION: NA   LOWER EXTREMITY ROM: Rt hip limited by 50%, hamstrings limited by 50% bil.    LOWER EXTREMITY MMT:   MMT Right eval Right  09/27/22 Left eval Right 10/30/22 Right  01/17/23 Left 10/30/22 Left 01/17/23  Hip flexion 3+ 4- 4- 4- 4 4- 4+  Hip extension           Hip abduction 3-  4    4  Hip adduction           Hip internal rotation           Hip external rotation           Knee flexion 3- 4- 3+  4  4+  Knee extension 3+ 4  4- 4 painful hip 4+ 4- 4+  Ankle dorsiflexion 3+ 4- 4- 4-  4   Ankle plantarflexion           Ankle inversion           Ankle eversion            (Blank rows = not tested)    FUNCTIONAL TESTS:   Eval: Timed up and go (TUG): 1 min, 27 seconds   09/27/22: 5x sit to stand: 29 seconds with use of Lt hand  TUG: 27 seconds   10/30/22: 5x sit to stand: 23 sec use of LT hand     TUG: 26 sec with standard cane   11/20/22: 5x sit to stand: 18.68 seconds    TUG: 19.45 without device    3 minute walk test: 225 feet with a standard cane   01/10/23: 5x sit to stand: 15.77 seconds   01/17/23: TUG with cane: 15.41 seconds with cane    3 min walk test: 313 feet with cane    03/07/23: 3 min walk 275 feet with single point cane   TUG: 16 seconds   03/14/23: 6 min walk test: 723 feet with standard cane    TUG: 14.16 without cane    5x sit to stand: 12.15 seconds without hand   03/28/23 TUG 11 seconds without cane  5x STS 10.6 seconds no Ues  04/18/2023: 6 min walk test:  532 ft with [facility]      TODAY'S TREATMENT:   DATE:  04/25/2023 Nustep level 3 x8 minutes with LE only. >55 spm PT present throughout to discuss status FWD and lateral step ups with 6" step with unilateral UE support 2x10 bilat-fatigue on 2nd set  Box step around colored discs on the floor-CGA by gait belt  Farmers carry 6# Lt UE from gym mat table to mirror 2 laps Amb 6 min around PT gym using standard cane- verbal cues for step length Alternating step tap on 6" step 2x10 with CGA Leg Press (seat at 5) 50# 2x10, single leg: Lt 35#, Rt 15# 2x10 bil each Hurdles: step to with CGA by PT on gait belt  DATE:  04/25/2023 Nustep level 4 x7 minutes with LE only. PT present throughout to discuss status FWD and lateral step ups with 6" step with unilateral UE support 2x10 bilat-fatigue on 2nd set  Seated modified dead lift with 6# dumbbell x7 Farmers carry 6# L UE from gym mat table to mirror 2 laps Amb 6 min around PT gym- walked over hurdles in the while doing this.  Leg Press (seat at 5) 50# 2x10  DATE:  04/18/2023 Nustep level 4 x7 minutes with LE only. PT present throughout to discuss status FWD and  lateral step ups with 6" step with unilateral UE support x10 bilat Seated modified dead lift with 6# dumbbell x7 Farmers carry 6# L UE from gym mat table to mirror 2 laps Amb 6 min around PT gym with [facility] for 532 ft Leg Press (seat at 5) 50# 2x10    PATIENT EDUCATION:  Education details: Access Code: [email or web address] Person educated: Patient Education method: Explanation, Demonstration, and Handouts Education comprehension: verbalized understanding and returned demonstration   HOME EXERCISE PROGRAM: Access Code: [email or web address] URL: [email or web address] Date: 09/27/2022 Prepared by: Tresa Endo  Exercises - Seated Long Arc Quad  - 3 x daily - 7 x weekly - 2 sets - 10 reps - 5 hold - Seated March   - 3 x daily - 7  x weekly - 3 sets - 10 reps - Seated Heel Toe Raises   - 3 x daily - 7 x weekly - 2 sets - 10 reps - Seated Isometric Hip Adduction with Ball  - 3 x daily - 7 x weekly - 2 sets - 10 reps - Sit to Stand with Armchair  - 2 x daily - 7 x weekly - 2 sets - 5-10 reps - Standing Hip Abduction with Counter Support  - 1 x daily - 7 x weekly - 2 sets - 10 reps - Heel Raises with Counter Support  - 2 x daily - 7 x weekly - 2 sets - 10 reps  Patient Education - Walking with a Single [employer] - Modified 4 Point Gait Pattern  ASSESSMENT:   CLINICAL IMPRESSION:      Pt continues to improve endurance, balance and strength. Pt was able to keep NuStep >55 spm for 8 minutes today. She increased weight on leg press and did will with single leg press. PT provided guarding and cueing throughout session for safety.  Patient continues to progress towards goal related activities.  Patient will benefit from skilled PT to address the below impairments and improve overall function.   OBJECTIVE IMPAIRMENTS: Abnormal gait, decreased activity tolerance, decreased balance, decreased mobility, difficulty walking, decreased strength, decreased safety awareness, impaired perceived functional ability,  impaired flexibility, postural dysfunction, and pain.    ACTIVITY LIMITATIONS: carrying, lifting, sitting, standing, stairs, transfers, hygiene/grooming, and locomotion level   PARTICIPATION LIMITATIONS: meal prep, cleaning, laundry, driving, shopping, and community activity   PERSONAL FACTORS: Age, Past/current experiences, and 1-2 comorbidities: CVA, falls, Rt hip fracture with ORIF  are also affecting patient's functional outcome.    REHAB POTENTIAL: Good   CLINICAL DECISION MAKING: Evolving/moderate complexity   EVALUATION COMPLEXITY: Moderate     GOALS: Goals reviewed with patient? Yes   SHORT TERM GOALS: Target date: 09/06/2022   Be independent in initial HEP Baseline: Goal status: Goal met 08/14/22   2.  Improve LE strength to perform sit to stand with moderate Lt UE support Baseline:  Goal status: Goal met 08/28/22  3.  Perform TUG in < or = to 60 seconds to reduce falls risk Baseline: 27 seconds (09/27/22) Goal status: MET      LONG TERM GOALS: Target date:  05/28/23   Be independent in advanced HEP Baseline: independent in current HEP and further progress is needed as she becomes more stable (03/14/23)  Goal status: in progress    2.  Perform TUG in < or = to 13 seconds to reduce falls risk Baseline: 14.16 seconds without cane (03/14/23) Goal status: IN PROGRESS   3.  ambulate > or = to 875 feet in 6 minutes to improve community ambulation and independence  Baseline: 723 feet (03/14/23) Goal status: REVISED    4.  Ambulate with hemi walker with supervision or no guard due to improved gait Baseline: able to do this Goal status: MET   5.  perform 5x sit to stand in < or = to 12 seconds to reduce falls risk  Baseline: 12.15 seconds (03/14/23)  Goal Status: In progress   6. Ambulate without device for home distances 100% of the time due to improved stability and reduced falls risk  Baseline: using cane 40% when fatigued (03/14/23)  Goal status: in progress    7. Improve balance to walk her dog short distances with independence   Baseline: not safe to walk independently with her dog (03/14/23)  Goal status: NEW     PLAN:   PT FREQUENCY: 1x/week   PT DURATION: 10 weeks   PLANNED INTERVENTIONS: Therapeutic exercises, Therapeutic activity, Neuromuscular re-education, Balance training, Gait training, Patient/Family education, Self Care, Joint mobilization, Stair training, Dry Needling, Electrical stimulation, Cryotherapy, Moist heat, Taping, Manual therapy, and Re-evaluation   PLAN FOR NEXT SESSION: Continue to work on gait , strength and balance. Increase focus on balance on foam/unsteady surfaces      Lorrene Reid, PT 05/03/23 1:22 PM    [facility] [address], [address] [place], [place] [address] Phone # [phone] Fax [phone]

## 2023-05-04 ENCOUNTER — Ambulatory Visit (HOSPITAL_COMMUNITY): Payer: Medicare PPO | Attending: Cardiology

## 2023-05-04 DIAGNOSIS — R072 Precordial pain: Secondary | ICD-10-CM | POA: Diagnosis not present

## 2023-05-04 DIAGNOSIS — I25118 Atherosclerotic heart disease of native coronary artery with other forms of angina pectoris: Secondary | ICD-10-CM | POA: Diagnosis not present

## 2023-05-04 LAB — MYOCARDIAL PERFUSION IMAGING
LV dias vol: 48 mL (ref 46–106)
LV sys vol: 11 mL
Nuc Stress EF: 76 %
Peak HR: 85 {beats}/min
Rest HR: 48 {beats}/min
Rest Nuclear Isotope Dose: 10.2 mCi
SDS: 1
SRS: 0
SSS: 1
Stress Nuclear Isotope Dose: 32.1 mCi
TID: 1.01

## 2023-05-04 MED ORDER — REGADENOSON 0.4 MG/5ML IV SOLN
0.4000 mg | Freq: Once | INTRAVENOUS | Status: AC
  Administered 2023-05-04: 0.4 mg via INTRAVENOUS

## 2023-05-04 MED ORDER — TECHNETIUM TC 99M TETROFOSMIN IV KIT
10.2000 | PACK | Freq: Once | INTRAVENOUS | Status: AC | PRN
  Administered 2023-05-04: 10.2 via INTRAVENOUS

## 2023-05-04 MED ORDER — TECHNETIUM TC 99M TETROFOSMIN IV KIT
32.1000 | PACK | Freq: Once | INTRAVENOUS | Status: AC | PRN
  Administered 2023-05-04: 32.1 via INTRAVENOUS

## 2023-05-07 ENCOUNTER — Telehealth (HOSPITAL_BASED_OUTPATIENT_CLINIC_OR_DEPARTMENT_OTHER): Payer: Self-pay

## 2023-05-07 ENCOUNTER — Ambulatory Visit: Payer: Medicare PPO | Admitting: Occupational Therapy

## 2023-05-07 DIAGNOSIS — R278 Other lack of coordination: Secondary | ICD-10-CM

## 2023-05-07 DIAGNOSIS — M6281 Muscle weakness (generalized): Secondary | ICD-10-CM

## 2023-05-07 DIAGNOSIS — I69351 Hemiplegia and hemiparesis following cerebral infarction affecting right dominant side: Secondary | ICD-10-CM

## 2023-05-07 DIAGNOSIS — R2681 Unsteadiness on feet: Secondary | ICD-10-CM | POA: Diagnosis not present

## 2023-05-07 DIAGNOSIS — M25611 Stiffness of right shoulder, not elsewhere classified: Secondary | ICD-10-CM

## 2023-05-07 NOTE — Telephone Encounter (Addendum)
Results called to patient who verbalizes understanding!   ----- Message from Alver Sorrow sent at 05/07/2023  8:28 AM EDT ----- Stress test low risk.  No evidence of ischemia.  Heart muscle function normal. Good result!

## 2023-05-07 NOTE — Therapy (Addendum)
OUTPATIENT OCCUPATIONAL THERAPY NEURO EVALUATION  Patient Name: Angela Buchanan MRN: [id number] DOB:27-Jan-1951, 72 y.o., female Today's Date: 05/07/2023  PCP: Shon Hale, MD REFERRING PROVIDER: Shon Hale, MD  END OF SESSION:  OT End of Session - 05/07/23 1603     Visit Number 5    Number of Visits 17    Date for OT Re-Evaluation 06/15/23    Authorization Type [employer]    OT Start Time 1404    OT Stop Time 1446    OT Time Calculation (min) 42 min    Behavior During Therapy [facility] for tasks assessed/performed                 Past Medical History:  Diagnosis Date   Atypical chest pain 07/19/2021   Bigeminy    no current med.   Breast cancer (HCC) 06/2017   right   Bruises easily    CAD in native artery 10/31/2022   Carotid bruit 11/13/2019   Dental crowns present    History of diverticulitis    Hypertension    states under control with med., has been on med. x 5 yr.   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   Past Surgical History:  Procedure Laterality Date   ABDOMINAL HYSTERECTOMY     partial   BREAST LUMPECTOMY Right 06/22/2017   Malignant   BREAST LUMPECTOMY WITH RADIOACTIVE SEED AND SENTINEL LYMPH NODE BIOPSY Right 06/22/2017   Procedure: RIGHT BREAST LUMPECTOMY WITH RADIOACTIVE SEED AND RIGHT AXILLARY DEEP SENTINEL LYMPH NODE BIOPSY WITH BLUE DYE INJECTION;  Surgeon: Claud Kelp, MD;  Location: [facility];  Service: General;  Laterality: Right;   BREAST LUMPECTOMY WITH RADIOACTIVE SEED LOCALIZATION Left 06/22/2017   Procedure: LEFT BREAST LUMPECTOMY WITH RADIOACTIVE SEED LOCALIZATION;  Surgeon: Claud Kelp, MD;  Location: [facility];  Service:  General;  Laterality: Left;   CATARACT EXTRACTION W/ INTRAOCULAR LENS  IMPLANT, BILATERAL Bilateral    EXCISION OF BREAST BIOPSY Left 06/22/2017   benign   LOOP RECORDER INSERTION N/A 05/16/2022   Procedure: LOOP RECORDER INSERTION;  Surgeon: Lanier Prude, MD;  Location: [facility];  Service: Cardiovascular;  Laterality: N/A;   PERCUTANEOUS PINNING Right 06/22/2022   Procedure: PERCUTANEOUS PINNING OF RIGHT HIP;  Surgeon: Bjorn Pippin, MD;  Location: [facility];  Service: Orthopedics;  Laterality: Right;   TONSILLECTOMY     age 11   Patient Active Problem List   Diagnosis Date Noted   Right spastic hemiplegia (HCC) 12/14/2022   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 05/11/2020   PVC (premature ventricular contraction) 05/11/2020   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   Carotid bruit 11/13/2019   Pure hypercholesterolemia 11/13/2019   Malignant neoplasm  of upper-outer quadrant of right breast in female, estrogen receptor positive (HCC) 05/22/2017   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   Diplopia 09/17/2014   Dizziness and giddiness 09/17/2014   Essential hypertension 07/22/2014   Chest pain 07/22/2014   Family history of heart disease 07/22/2014    ONSET DATE: 05/13/22 (new referral 04/09/23)  REFERRING DIAG: G83.23 (ICD-10-CM) - 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   Pt accompanied by: self and significant other ([name])  PERTINENT HISTORY: hypertension, breast cancer, hyperlipidemia, Lt hip fracture secondary to fall 06/2022, CVA 05/2022    PRECAUTIONS: Fall, Rt breast cancer with node removal, osteoporosis   WEIGHT BEARING RESTRICTIONS: No  PAIN:  Are you having pain? No  FALLS: Has patient fallen in last 6 months? No  LIVING ENVIRONMENT: Lives with: lives with their spouse Lives in: House/apartment Stairs: Yes: External: 2 steps; on left going up Has following equipment at home: Quad cane large base, Environmental consultant - 2 wheeled, Hemi walker, Wheelchair (manual), shower chair, and Grab bars  PLOF: Independent and Independent with basic ADLs  PATIENT GOALS: to have better use my arm and hand  OBJECTIVE:   HAND DOMINANCE: Right  ADLs:  Transfers/ambulation related to ADLs: Utilizes [facility] for mobility in the community, 80% in the house but will walk without occasionally Eating: husband cuts foods, utilizing built up handles for self-feeding Grooming: needs assistance with washing hair UB Dressing: needs assistance with fastening bra LB Dressing: Mod-max assist for donning pants due to fear of falling, able to don socks and slip on shoes - unable to tie shoes Toileting: Mod I Bathing: Supervision Tub Shower transfers: Supervision, but able to complete transfers with use of grab bars Equipment: Shower seat with back, Grab bars, and Walk in shower  IADLs: Handwriting:  TBA at next session  MOBILITY STATUS: Needs Assist: utilized [facility] for mobility and Hx of falls  POSTURE COMMENTS:  rounded shoulders  and forward head  ACTIVITY TOLERANCE: Activity tolerance: fatigues with increased mobility and effort  FUNCTIONAL OUTCOME MEASURES: Upper Extremity Functional Scale (UEFS): TBD  UPPER EXTREMITY ROM:    Active ROM Right eval Left eval  Shoulder flexion 87   Shoulder abduction 78   Shoulder adduction    Shoulder extension    Shoulder internal rotation 90%   Shoulder external rotation 50%   Elbow flexion 132   Elbow extension -17   Wrist flexion 19   Wrist extension 35   Wrist ulnar deviation 15   Wrist radial deviation 13   Wrist pronation WNL   Wrist supination 20 from neutral   (Blank rows = not tested)  UPPER EXTREMITY MMT:     MMT Right eval Left eval  Shoulder flexion    Shoulder abduction    Shoulder adduction    Shoulder extension    Shoulder internal rotation    Shoulder external rotation    Middle trapezius    Lower trapezius    Elbow flexion    Elbow extension    Wrist flexion    Wrist extension    Wrist ulnar deviation    Wrist radial deviation    Wrist pronation  Wrist supination    (Blank rows = not tested)    Active ROM Extension (as flexion to loose gross grasp) Right eval Left 05/07/2023  Thumb MCP (0-60)    Thumb IP (0-80)    Thumb Radial abd/add (0-55)    Thumb Palmar abd/add (0-45)    Thumb opposition to index    Index MCP (0-90) 0   Index PIP (0-100) -30   Index DIP (0-70) -40   Long MCP (0-90) -5   Long PIP (0-100) -45   Long DIP (0-70) -5   Ring MCP (0-90_ 0   Ring PIP (0-100) -40   Ring DIP (0-70) -20   Little MCP (0-90) -7   Little PIP (0-100) -25   Little DIP (0-70) -5   (Blank rows = not tested)    HAND FUNCTION: Grip strength: Right: 7,9 = 8# lbs; Left: 30 lbs  COORDINATION: Finger Nose Finger test: slow and deliberate with RUE, but able to touch both nose and finger at this time 9 Hole Peg test: Right: unable to pick up any pegs with RUE  Box and Blocks:  Right 16 blocks, Left 55 blocks  SENSATION: Light  touch: WFL Hot/Cold: WFL  EDEMA: no swelling on eval  MUSCLE TONE: RUE: Mild and Hypertonic  COGNITION: Overall cognitive status: Within functional limits for tasks assessed  VISION: Subjective report: pt reports wearing glasses more since CVA Baseline vision: Wears glasses all the time, double vision after cataract removal Visual history: cataracts  OBSERVATIONS: Pt attempting grasp and pinch activities, frequently not incorporating index finger on R with increased flexion away from task.   TODAY'S TREATMENT:                                                           05/07/23 UE ROM: finger PIP flexion with OT providing demonstration and verbal cues for technique with positioning of LUE to facilitate increased blocking and stabilization during flexion.  Engaged in wrist extension on dowel to further facilitate increased extension, attempted with towel initially however pt requiring more rigid surface.   NMR: chest press and shoulder flexion ~90* with dowel with BUE to facilitate increased shoulder flexion, wrist extension, and symmetry.   Pt demonstrating decreased wrist extension, frequently compensating with R shoulder elevation and changing hand placement on dowel to compensate for decreased wrist extension.  OT bringing attention to hand placement and tactile cues at shoulder to facilitate increased symmetry. UE Functional use: engaged in picking up large foam blocks from lower surface to challenge functional reach with elbow extension and grasp/release with obtain and release blocks.  Pt demonstrating decreased index finger flexion and extension, requiring mod cues to attend to index finger during grasp.   05/03/23 Peg board: engaged in picking up large grip pegs from table top and rotating in palm of hands (unable to rotate in finger tips) to place into resistive peg board for coordination and strength.  Pt demonstrating loose gross grasp, index finger still not overly engaged in grasp,  however utilizing with min cues to rotate peg into place. Pt then removing pegs to challenge grip strength and then reaching across midline to place into container with small opening for increased precision. Handwriting: engaged in writing name x3 with use of large built up foam handle on pen.  Pt  continues to demonstrate decreased sustained grasp and motor control to write without foam handle. Coordination/UE ROM: Attempted pen tricks, however unable to rotate pen in between fingers, therefore transitioned to blocked DIP and PIP flexion and extension of index finger.  OT providing assistance and cues for setup and blocking at each joint, fading to pt completing with only initial cues.  Exercises - Seated Isolated Finger PIP Flexion AROM  - 2-3 x daily - 10 reps - Seated Finger DIP Extension PROM  - 2-3 x daily - 10 reps - 5 hold - Seated Finger PIP Extension PROM  - 2-3 x daily - 10 reps - 5 hold   04/26/23 UE ROM: engaged in wrist extension with prayer stretch with focus on maintaining palms together. Increased ROM with wrist flexion/extension with side to side movements.  OT instructed pt in thumb abduction with attempts at AROM and then PROM to facilitate increased ROM and stretch.  Pt demonstrating trace to mild activation with abduction/adduction.  Attempted opening to grip towel, however pt requiring manual opening of thumb but then able to squeeze and loosely release grasp on towel.  Engaged in PROM with finger extension and composite extension, OT providing tactile cues and verbal cues for hand placement to further facilitate ROM.  OT encouraged gross grasp and release without any item in hand to further facilitate increased flexion, with primary focus on index finger.  Reviewed completion of activities with arm rested on table in neutral position to allow for increased flexion/extension.  Exercises - Thumb Radial Abduction PROM  - 2-3 x daily - 10 reps - 5 sec hold - Hand PROM Finger Extension   - 2-3 x daily - 5 reps - 15-20 sec hold - Seated Finger MP Extension PROM  - 2-3 x daily - 5 reps - 5 sec hold - Seated Finger Composite Flexion Extension  - 2-3 x daily - 10 reps - Seated Gripping Towel  - 2-3 x daily - 10 reps - 5 sec hold Self-feeding: 44.43 sec without built up handle.  OT added handle, however pt then demonstrating increased lean and shoulder hike, therefore slower.  OT reiterated to pt encouragement for upright sitting posture and to observe for compensatory strategies (that may not be helping!).   PATIENT EDUCATION: Education details: ongoing condition specific education. Person educated: Patient and Spouse Education method: Explanation, Demonstration, and Verbal cues Education comprehension: verbalized understanding and needs further education  HOME EXERCISE PROGRAM: Access Code: [email or web address] URL: [email or web address] Date: 04/23/2023 Prepared by: [facility] - [facility]    GOALS: Goals reviewed with patient? Yes  SHORT TERM GOALS: Target date: 05/18/23  Pt and spouse will be independent with HEP for ROM, strengthening, and coordination.  Baseline: Goal status: IN PROGRESS  2.  Pt will verbalize understanding of task modifications and/or potential AE needs to increase ease, safety, and independence w/ ADLs.  Baseline: Goal status: IN PROGRESS  LONG TERM GOALS: Target date: 06/15/23  Pt will demonstrate improved functional grasp in RUE by 6# to open new jar, increased handwriting, and self-feeding.  Baseline: R: 8#, L: 30# Goal status: IN PROGRESS  2.  Pt will demonstrate improved UE functional use for ADLs as evidenced by increasing box/ blocks score by 6 blocks with RUE. Baseline: R: 16 blocks, L: 55 blocks Goal status: IN PROGRESS  3.  Pt will demonstrate improved shoulder ROM (shoulder flexion and external rotation) as needed for ADLs/IADLs (washing hair, putting on jacket, etc).  Baseline:  Goal status: IN  PROGRESS  4.  Pt will demonstrate improvement of finger extension by 15* TAM of index and long ringer finger to allow for more functional hand opening.  Baseline: -70 IF, -55 LF Goal status: IN PROGRESS  5.  Pt will demonstrate improved handwriting with ability to write name and simple grocery list with 90% legibility. Baseline:  Goal status: IN PROGRESS  6.  Pt will demonstrate improvements in functional use of dominant RUE as evidenced by improvements on FOTO. Baseline: TBD Goal status: IN PROGRESS  ASSESSMENT:  CLINICAL IMPRESSION: Pt benefiting from cues for positioning to decrease compensatory shoulder hike and trunk rotation during UE ROM and attempts at functional grasp and release. Pt continues to demonstrate decreased finger flexion/extension, especially with index finger both during structured exercises and attempts at functional tasks.    PERFORMANCE DEFICITS: in functional skills including ADLs, IADLs, coordination, dexterity, tone, ROM, strength, pain, flexibility, Fine motor control, Gross motor control, mobility, balance, body mechanics, endurance, decreased knowledge of precautions, decreased knowledge of use of DME, and UE functional use and psychosocial skills including environmental adaptation and routines and behaviors.   IMPAIRMENTS: are limiting patient from ADLs and IADLs.   CO-MORBIDITIES: may have co-morbidities  that affects occupational performance. Patient will benefit from skilled OT to address above impairments and improve overall function.  MODIFICATION OR ASSISTANCE TO COMPLETE EVALUATION: Min-Moderate modification of tasks or assist with assess necessary to complete an evaluation.  OT OCCUPATIONAL PROFILE AND HISTORY: Detailed assessment: Review of records and additional review of physical, cognitive, psychosocial history related to current functional performance.  CLINICAL DECISION MAKING: Moderate - several treatment options, min-mod task modification  necessary  REHAB POTENTIAL: Good  EVALUATION COMPLEXITY: Moderate    PLAN:  OT FREQUENCY: 2x/week  OT DURATION: 8 weeks  PLANNED INTERVENTIONS: self care/ADL training, therapeutic exercise, therapeutic activity, neuromuscular re-education, manual therapy, passive range of motion, functional mobility training, splinting, electrical stimulation, ultrasound, compression bandaging, moist heat, cryotherapy, patient/family education, psychosocial skills training, energy conservation, coping strategies training, and DME and/or AE instructions  RECOMMENDED OTHER SERVICES: NA  CONSULTED AND AGREED WITH PLAN OF CARE: Patient and family member/caregiver  PLAN FOR NEXT SESSION: complete UEFS or FOTO, initiate gross grasp tasks, further address wrist flexion/extension, supination, and finger flexion and extension     , , OTR/L 05/07/2023, 4:04 PM

## 2023-05-08 ENCOUNTER — Encounter (HOSPITAL_BASED_OUTPATIENT_CLINIC_OR_DEPARTMENT_OTHER): Payer: Self-pay | Admitting: Family

## 2023-05-08 ENCOUNTER — Ambulatory Visit (HOSPITAL_BASED_OUTPATIENT_CLINIC_OR_DEPARTMENT_OTHER): Payer: Medicare PPO | Admitting: Family

## 2023-05-08 VITALS — BP 128/63 | HR 54 | Ht 62.0 in | Wt 101.2 lb

## 2023-05-08 DIAGNOSIS — I491 Atrial premature depolarization: Secondary | ICD-10-CM

## 2023-05-08 DIAGNOSIS — Z8673 Personal history of transient ischemic attack (TIA), and cerebral infarction without residual deficits: Secondary | ICD-10-CM

## 2023-05-08 DIAGNOSIS — I25118 Atherosclerotic heart disease of native coronary artery with other forms of angina pectoris: Secondary | ICD-10-CM | POA: Diagnosis not present

## 2023-05-08 DIAGNOSIS — E785 Hyperlipidemia, unspecified: Secondary | ICD-10-CM | POA: Diagnosis not present

## 2023-05-08 NOTE — Patient Instructions (Addendum)
Medication Instructions:  Your physician recommends that you continue on your current medications as directed. Please refer to the Current Medication list given to you today.  *If you need a refill on your cardiac medications before your next appointment, please call your pharmacy*  Follow-Up: At [facility], you and your health needs are our priority.  As part of our continuing mission to provide you with exceptional heart care, we have created designated Provider Care Teams.  These Care Teams include your primary Cardiologist (physician) and Advanced Practice Providers (APPs -  Physician Assistants and Nurse Practitioners) who all work together to provide you with the care you need, when you need it.  We recommend signing up for the patient portal called "MyChart".  Sign up information is provided on this After Visit Summary.  MyChart is used to connect with patients for Virtual Visits (Telemedicine).  Patients are able to view lab/test results, encounter notes, upcoming appointments, etc.  Non-urgent messages can be sent to your provider as well.   To learn more about what you can do with MyChart, go to [email or web address].    Your next appointment:   Follow up as scheduled   Other Instructions

## 2023-05-08 NOTE — Progress Notes (Signed)
Cardiology Office Note:  .   Date:  05/08/2023  ID:  Angela Buchanan, DOB 03-24-1951, MRN [id number] PCP: Shon Hale, MD  [facility] Providers Cardiologist:  Chilton Si, MD    History of Present Illness: .   Angela Buchanan is a 72 y.o. female with a hx of celiac, breast cancer s/p lumpectomy and XRT, R subclavian DVT, HTN, CVA, palpitations, PAC/PVC, HLD, CAD.   Prior monitor 2017 for palpitations revealed frequent PVCs. Myoview 2015 with no ischemia. Seen 11/2019 for palpitations and CV risk assessment. Lisinopril increased for hypertension and Rosuvastatin initiated. She developed myalgias and reduced to every other day. Repeat 30 day monitor with some PAC and PVC which were treated by Metoprolol. Echo 12/2019 normal LVEF, gr1DD and normal carotid dopplers. Palpitations much improved on Metoprolol. Due to need for BP control, pharmacy team increased HCTZ and reduced Metoprolol. HCTZ then transitioned to Amlodipine due to hyponatremia. Myoview 10/2020 with LVEF 70%, fixed perfusion defect at apex, apical anterior, lateral and inferior walls to felt to be artifact with no ischemia. She had been transitioned from Rosuvastatin to Pravasatatin due to myalgias. Cardiac CTA11/2022 due to reports of chest pain which showed coronary calcium score of 14.4 with minimal nonobstructive coronary disease.    Admitted 05/2022 with cryptogenic stroke.  Echo LVEF 60 to 65%, normal diastolic function.  ILR implanted and no arrhythmias have been detected.   Saw Dr. Duke Salvia 10/31/22 doing overall well aside from residual right-sided hemiparesis and tremors.  Lipid panel updated.  BP elevated and amlodipine increased to 10 mg.  Lisinopril and metoprolol were continued at present dose.   She saw pharmacy team 12/07/2022.  LDL not at goal of less than 70.  Pravastatin continued and Zetia 10 mg daily.  Last seen 01/09/2023 doing well from cardiac perspective with no changes made.   She saw primary care  03/27/2023 chest pain, exertional dyspnea and exercise intolerance.  EKG performed 03/27/2023 at PCP independently reviewed reveals sinus rhythm 60 bpm with no acute ST/T wave changes.   Seen in clinic 04/04/23 with persistent chest discomfort and dyspnea.  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  There was haziness in the distal LAD poorly-defined unable to rule out significant soft plaque.  Subsequent nuclear stress test 05/04/2023 low risk study.  Presents today for follow up with her husband. 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  Reviewed cardiac CTA and stress test in detail.  She is reassured by result.  Notes dyspnea, chest pain episodes are happening less frequently.  Continues to work with PT and OT and is pleased with progress on residual stroke deficits.    ROS: Please see the history of present illness.    All other systems reviewed and are negative.   Studies Reviewed: .        Cardiac Studies & Procedures     STRESS TESTS  MYOCARDIAL PERFUSION IMAGING 05/04/2023  Narrative   The study is normal. The study is low risk.   Nonspecific <102mm horizontal ST depression was noted in inferior leads   LV perfusion is normal. There is no evidence of ischemia. There is no evidence of infarction.   Left ventricular function is normal. End diastolic cavity size is normal. End systolic cavity size is normal.   Prior study available for comparison from 10/05/2020.  Fixed perfusion defect  at apex with normal wall motion consistent with artifact Low risk study   ECHOCARDIOGRAM  ECHOCARDIOGRAM COMPLETE 05/13/2022  Narrative ECHOCARDIOGRAM REPORT    Patient Name:   Angela Buchanan Date of Exam: 05/13/2022 Medical Rec #:  [id number]     Height:       62.0 in Accession #:    [id number]    Weight:       120.8 lb Date of Birth:  [birth year]      BSA:          1.543 m Patient Age:    70 years      BP:           133/75 mmHg Patient Gender: F             HR:           69 bpm. Exam Location:  Inpatient  Procedure: 2D Echo, Cardiac Doppler and Color Doppler  Indications:    Stroke  History:        Patient has no prior history of Echocardiogram examinations.  Sonographer:    Roosvelt Maser RDCS Referring Phys: [id number] ERIC LINDZEN  IMPRESSIONS   1. Left ventricular ejection fraction, by estimation, is 60 to 65%. The left ventricle has normal function. The left ventricle has no regional wall motion abnormalities. Left ventricular diastolic parameters were normal. 2. Right ventricular systolic function is normal. The right ventricular size is normal. Tricuspid regurgitation signal is inadequate for assessing PA pressure. 3. The mitral valve is grossly normal. Trivial mitral valve regurgitation. No evidence of mitral stenosis. 4. The aortic valve is tricuspid. Aortic valve regurgitation is not visualized. No aortic stenosis is present. 5. The inferior vena cava is normal in size with greater than 50% respiratory variability, suggesting right atrial pressure of 3 mmHg.  Conclusion(s)/Recommendation(s): No intracardiac source of embolism detected on this transthoracic study. Consider a transesophageal echocardiogram to exclude cardiac source of embolism if clinically indicated.  FINDINGS Left Ventricle: Left ventricular ejection fraction, by estimation, is 60 to 65%. The left ventricle has normal function. The left ventricle has no regional wall motion abnormalities. The left ventricular internal cavity size was normal in size. There is no left ventricular hypertrophy. Left ventricular diastolic parameters were normal.  Right Ventricle: The right ventricular size is normal. No increase in right ventricular wall thickness. Right ventricular systolic function is normal. Tricuspid regurgitation signal is inadequate for assessing PA  pressure.  Left Atrium: Left atrial size was normal in size.  Right Atrium: Right atrial size was normal in size.  Pericardium: There is no evidence of pericardial effusion.  Mitral Valve: The mitral valve is grossly normal. Trivial mitral valve regurgitation. No evidence of mitral valve stenosis.  Tricuspid Valve: The tricuspid valve is grossly normal. Tricuspid valve regurgitation is not demonstrated. No evidence of tricuspid stenosis.  Aortic Valve: The aortic valve is tricuspid. Aortic valve regurgitation is not visualized. No aortic stenosis is present.  Pulmonic Valve: The pulmonic valve was grossly normal. Pulmonic valve regurgitation is not visualized. No evidence of pulmonic stenosis.  Aorta: The aortic root and ascending aorta are structurally normal, with no evidence of dilitation.  Venous: The inferior vena cava is normal in size with greater than 50% respiratory variability, suggesting right atrial pressure of 3 mmHg.  IAS/Shunts: The atrial septum is grossly normal.   LEFT VENTRICLE PLAX 2D LVIDd:         3.60 cm   Diastology LVIDs:  2.60 cm   LV e' medial:    10.30 cm/s LV PW:         0.80 cm   LV E/e' medial:  6.4 LV IVS:        0.60 cm   LV e' lateral:   8.05 cm/s LVOT diam:     1.80 cm   LV E/e' lateral: 8.2 LV SV:         56 LV SV Index:   36 LVOT Area:     2.54 cm   RIGHT VENTRICLE RV Basal diam:  2.60 cm  LEFT ATRIUM             Index        RIGHT ATRIUM           Index LA diam:        2.60 cm 1.69 cm/m   RA Area:     12.60 cm LA Vol (A2C):   50.2 ml 32.53 ml/m  RA Volume:   27.80 ml  18.02 ml/m LA Vol (A4C):   33.5 ml 21.71 ml/m LA Biplane Vol: 40.9 ml 26.51 ml/m AORTIC VALVE LVOT Vmax:   108.00 cm/s LVOT Vmean:  67.200 cm/s LVOT VTI:    0.221 m  AORTA Ao Root diam: 2.90 cm Ao Asc diam:  2.80 cm  MITRAL VALVE MV Area (PHT): 3.91 cm    SHUNTS MV Decel Time: 194 msec    Systemic VTI:  0.22 m MV E velocity: 65.70 cm/s  Systemic  Diam: 1.80 cm MV A velocity: 98.50 cm/s MV E/A ratio:  0.67  Lennie Odor MD Electronically signed by Lennie Odor MD Signature Date/Time: 05/13/2022/6:21:45 PM    Final    MONITORS  CARDIAC EVENT MONITOR 01/09/2020  Narrative 30 Day Event Monitor  Quality: Fair.  Baseline artifact. Predominant rhythm: sinus rhythm Average heart rate: 69 bpm Max heart rate: 131 bpm Min heart rate: 47 bpm Pauses >2.5 seconds: none  Patient did submit a symptom diary.  She reported multiple episodes of chest pain and fluttering.  At which time most of the time sinus rhythm with noted.  She did have occasional PACs or PVCs. 4 beats of atrial tachycardia. Occasional PACs and PVCs noted.  Tiffany C. Duke Salvia, MD, [facility] 01/11/2020 4:24 PM   CT SCANS  CT CORONARY MORPH W/CTA COR W/SCORE 04/19/2023  Addendum 04/29/2023 12:19 AM ADDENDUM REPORT: 04/29/2023 00:17  EXAM: OVER-READ INTERPRETATION  CT CHEST  The following report is an over-read performed by radiologist Dr. Aram Candela of [facility] on 04/29/2023. This over-read does not include interpretation of cardiac or coronary anatomy or pathology. The coronary calcium score/coronary CTA interpretation by the cardiologist is attached.  COMPARISON:  August 02, 2021  FINDINGS: Cardiovascular: There are no significant extracardiac vascular findings.  Mediastinum/Nodes: There are no enlarged lymph nodes within the visualized mediastinum.  Lungs/Pleura: There is no pleural effusion. The visualized lungs appear clear.  Upper abdomen: No significant findings in the visualized upper abdomen.  Musculoskeletal/Chest wall: No chest wall mass or suspicious osseous findings within the visualized chest.  IMPRESSION: No significant extracardiac findings within the visualized chest.   Electronically Signed By: Aram Candela M.D. On: 04/29/2023 00:17  Narrative CLINICAL DATA:  Chest pain  EXAM: Cardiac/Coronary  CTA  TECHNIQUE: A non-contrast, gated CT scan was obtained with axial slices of 3 mm through the heart for calcium scoring. Calcium scoring was performed using the Agatston method. A 120 kV prospective, gated, contrast cardiac scan was  obtained. Gantry rotation speed was 250 msecs and collimation was 0.6 mm. Two sublingual nitroglycerin tablets (0.8 mg) were given. The 3D data set was reconstructed in 5% intervals of the 35-75% of the R-R cycle. Diastolic phases were analyzed on a dedicated workstation using MPR, MIP, and VRT modes. The patient received 95 cc of contrast.  FINDINGS: Image quality: Excellent.  Noise artifact is: Limited.  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 Right dominance.  Left main: Absent  Left anterior descending artery: The LAD is a large vessel that comes directly off the left coronary cusp. There is mild calcified plaque in the distal LAD with associated stenosis of 25-49% as well as a haziness below the plaque that is poorly defined. Cannot rule out soft plaque in that area. The LAD gives off a large bifurcating patent diagonal #1 branch and patent small D2.  Left circumflex artery: The LCX is non-dominant and arises anomalously from the distal RCA and is patent with no evidence of plaque or stenosis.  Right coronary artery: The RCA is dominant with normal take off from the right coronary cusp. There is mild calcified plaque in the proximal and distal RCA with associated stenosis of 25-49%. The RCA terminates as a PDA and right posterolateral branch. There is mild to moderate calcified plaque in the mid PDA with associated stenosis of 25-49% but could be >50%. The distal RCA gives rise to a small LCx artery that is patent with no stenosis.  Right Atrium: Right atrial size is within normal limits.  Right Ventricle: The right ventricular cavity is within normal limits.  Left  Atrium: Left atrial size is normal in size with no left atrial appendage filling defect.  Left Ventricle: The ventricular cavity size is within normal limits.  Pulmonary arteries: Normal in size.  Pulmonary veins: Normal pulmonary venous drainage.  Pericardium: Normal thickness without significant effusion or calcium present.  Cardiac valves: The aortic valve is trileaflet without significant calcification. The mitral valve is normal without significant calcification.  Aorta: Normal caliber without significant disease.  Extra-cardiac findings: See attached radiology report for non-cardiac structures.  IMPRESSION: 1. Coronary calcium score of 56.2. This was 59th percentile for age-, sex, and race-matched controls.  2. 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 Right dominance.  3. Mild atherosclerosis (25-49% proximal and distal RCA/mid RPDA/distal LAD). There is a haziness in the distal LAD that is poorly defined but cannot rule out significant soft plaque. CAD RADS 2.  4. Recommend preventive therapy and risk factor modification.  5. Consider Stress PET CT or Nuclear stress test as distal LAD is not well visualized.  RECOMMENDATIONS: 1. CAD-RADS 0: No evidence of CAD (0%). Consider non-atherosclerotic causes of chest pain.  2. CAD-RADS 1: Minimal non-obstructive CAD (0-24%). Consider non-atherosclerotic causes of chest pain. Consider preventive therapy and risk factor modification.  3. CAD-RADS 2: Mild non-obstructive CAD (25-49%). Consider non-atherosclerotic causes of chest pain. Consider preventive therapy and risk factor modification.  4. CAD-RADS 3: Moderate stenosis. Consider symptom-guided anti-ischemic pharmacotherapy as well as risk factor modification per guideline directed care. Additional analysis with CT FFR will be submitted.  5. CAD-RADS 4: Severe stenosis. (70-99% or > 50% left main). Cardiac catheterization  or CT FFR is recommended. Consider symptom-guided anti-ischemic pharmacotherapy as well as risk factor modification per guideline directed care. Invasive coronary angiography recommended with revascularization per published guideline statements.  6. CAD-RADS 5: Total coronary occlusion (  100%). Consider cardiac catheterization or viability assessment. Consider symptom-guided anti-ischemic pharmacotherapy as well as risk factor modification per guideline directed care.  7. CAD-RADS N: Non-diagnostic study. Obstructive CAD can't be excluded. Alternative evaluation is recommended.  Armanda Magic, MD  Electronically Signed: By: Armanda Magic M.D. On: 04/19/2023 18:03   CT SCANS  CT CORONARY MORPH W/CTA COR W/SCORE 08/02/2021  Addendum 08/04/2021  8:31 AM ADDENDUM REPORT: 08/04/2021 08:28  EXAM: OVER-READ INTERPRETATION  CT CHEST  The following report is an over-read performed by radiologist Dr. Deberah Pelton [facility] on 08/03/2021. This over-read does not include interpretation of cardiac or coronary anatomy or pathology. The coronary calcium score/coronary CTA interpretation by the cardiologist is attached.  COMPARISON:  None.  FINDINGS: No suspicious nodules, masses, or infiltrates are identified in the visualized portion of the lungs. No pleural fluid seen.  The visualized portions of the mediastinum and chest wall are unremarkable.  IMPRESSION: No significant non-cardiac abnormality in visualized portion of the thorax.   Electronically Signed By: Danae Orleans M.D. On: 08/04/2021 08:28  Narrative CLINICAL DATA:  72 yo female with chest pain  EXAM: Cardiac/Coronary CTA  TECHNIQUE: A non-contrast, gated CT scan was obtained with axial slices of 3 mm through the heart for calcium scoring. Calcium scoring was performed using the Agatston method. A 120 kV prospective, gated, contrast cardiac scan was obtained. Gantry rotation speed was 250 msecs  and collimation was 0.6 mm. Two sublingual nitroglycerin tablets (0.8 mg) were given. The 3D data set was reconstructed in 5% intervals of the 35-75% of the R-R cycle. Diastolic phases were analyzed on a dedicated workstation using MPR, MIP, and VRT modes. The patient received 95 cc of contrast.  FINDINGS: Image quality: Average.  Noise artifact is: Limited.  Coronary Arteries:  Normal coronary origin.  Right dominance.  Left main: The left main is a large caliber vessel with a normal take off from the left coronary cusp that bifurcates to form a left anterior descending artery. There is no plaque or stenosis.  Left anterior descending artery: The LAD is patent without evidence of plaque or stenosis. The LAD gives off branching D1, smaller D2 and very smalll D3.  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  Right coronary artery: The RCA is dominant with normal take off from the right coronary cusp. There is no evidence of plaque or stenosis. The RCA gives off PDA, 2 posterolateral branches and then terminates in the left circumflex territory. There is minimal (0-24) calcified plaque in the PDA.  Right Atrium: Right atrial size is within normal limits.  Right Ventricle: The right ventricular cavity is within normal limits.  Left Atrium: Left atrial size is normal in size with no left atrial appendage filling defect.  Left Ventricle: The ventricular cavity size is within normal limits. There are no stigmata of prior infarction. There is no abnormal filling defect.  Pulmonary arteries: Normal in size.  Pulmonary veins: Normal pulmonary venous drainage.  Pericardium: Normal thickness with no significant effusion or calcium present.  Cardiac valves: The aortic valve is trileaflet without significant calcification. The mitral valve is normal structure without significant calcification.  Aorta: Normal caliber with mild  atherosclerosis.  Extra-cardiac findings: See attached radiology report for non-cardiac structures.  IMPRESSION: 1. Coronary calcium score of 14.4. This was 16 percentile for age-, sex, and race-matched controls.  2. Normal coronary origin with right dominance; congenitally absent left circumflex; RCA gives off PDA and 2 posterolateral branches  and terminates in the left circumflex distribution.  3. Minimal plaque in the PDA.  4. Mild aortic atherosclerosis.  RECOMMENDATIONS: CAD-RADS 1: Minimal non-obstructive CAD (0-24%). Consider non-atherosclerotic causes of chest pain. Consider preventive therapy and risk factor modification.  Olga Millers, MD  interpretation by the cardiologist is attached.  Electronically Signed: By: Olga Millers M.D. On: 08/02/2021 15:54          Risk Assessment/Calculations:             Physical Exam:   VS:  BP 128/63 (BP Location: Left Arm, Patient Position: Sitting, Cuff Size: Normal)   Pulse (!) 54   Ht 5\' 2"  (1.575 m)   Wt 101 lb 3.2 oz (45.9 kg)   LMP  (LMP Unknown)   SpO2 100%   BMI 18.51 kg/m    Wt Readings from Last 3 Encounters:  05/08/23 101 lb 3.2 oz (45.9 kg)  04/04/23 101 lb (45.8 kg)  02/22/23 101 lb (45.8 kg)    GEN: Well nourished, well developed in no acute distress NECK: No JVD; No carotid bruits CARDIAC: RRR, no murmurs, rubs, gallops RESPIRATORY:  Clear to auscultation without rales, wheezing or rhonchi  ABDOMEN: Soft, non-tender, non-distended EXTREMITIES:  No edema; No deformity   ASSESSMENT AND PLAN: .    HLD, LDL goal <70 - 03/07/23 total cholesterol 166, triglycerides 103, HDL 84, LDL 64. Continue Pravastatin, Zetia.   HTN - BP well controlled. Continue current antihypertensive regimen.  Heart healthy diet and regular cardiovascular exercise encouraged.     History of CVA- ILR with no arrhythmias. Continue lipid control with Pravastatin, Zetia. Continue optimal BP control with goal <130/80.  Participating in PT and OT with improvement in deficits. Discussed referral to PREP exercise program when done with PT/OT.   PAC - Well controlled on Metoprolol.    Hypokalemia - Maintained on potassium supplement. 03/27/23 K 4.1.    Nonobstructive CAD- cardiac CTA 08/2021 with coronary calcium score 14.4 which was 47th percentile with minimal calcified plaque in the PDA. 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 Repeat CTA 04/2019 for coronary calcium score 56.2 placing her in the 59th percentile for age/sex/race matched control, normal coronary origin with left circumflex arising from distal RCA.  Overall mild atherosclerosis 25 to 29% in proximal and distal RCA/mid RPDA/distal LAD.  Haziness distal LAD poorly defined with subsequent Myoview 05/04/2023 with no ischemia.  Chest pain/dyspnea improved since addition of Imdur, continue 15 mg nightly. Reassured by recent testing.  GDMT includes Imdur, Aspirin, Metoprolol, Pravastatin, Zetia, Imdur.        Dispo: follow up in November as scheduled with Dr. Duke Salvia or APP  Signed, Alver Sorrow, NP

## 2023-05-09 ENCOUNTER — Ambulatory Visit: Payer: Medicare PPO

## 2023-05-09 DIAGNOSIS — I69351 Hemiplegia and hemiparesis following cerebral infarction affecting right dominant side: Secondary | ICD-10-CM | POA: Diagnosis not present

## 2023-05-09 DIAGNOSIS — R2681 Unsteadiness on feet: Secondary | ICD-10-CM | POA: Diagnosis not present

## 2023-05-09 DIAGNOSIS — R2689 Other abnormalities of gait and mobility: Secondary | ICD-10-CM

## 2023-05-09 DIAGNOSIS — M6281 Muscle weakness (generalized): Secondary | ICD-10-CM

## 2023-05-09 NOTE — Therapy (Signed)
OUTPATIENT PHYSICAL THERAPY TREATMENT NOTE   Patient Name: Angela Buchanan MRN: [id number] DOB:[birth year], 72 y.o., female Today's Date: 05/09/2023  PCP:  Garth Bigness, MD   REFERRING PROVIDER:  Garth Bigness, MD      END OF SESSION:   PT End of Session - 05/09/23 1440     Visit Number 36    Authorization Type [employer]    Progress Note Due on Visit 40    PT Start Time 1402    PT Stop Time 1443    PT Time Calculation (min) 41 min    Activity Tolerance Patient tolerated treatment well    Behavior During Therapy [facility] for tasks assessed/performed                   Past Medical History:  Diagnosis Date   Atypical chest pain 07/19/2021   Bigeminy    no current med.   Breast cancer (HCC) 06/2017   right   Bruises easily    CAD in native artery 10/31/2022   Carotid bruit 11/13/2019   Dental crowns present    History of diverticulitis    Hypertension    states under control with med., has been on med. x 5 yr.   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   Past Surgical History:  Procedure Laterality Date   ABDOMINAL HYSTERECTOMY     partial   BREAST LUMPECTOMY Right 06/22/2017   Malignant   BREAST LUMPECTOMY WITH RADIOACTIVE SEED AND SENTINEL LYMPH NODE BIOPSY Right 06/22/2017   Procedure: RIGHT BREAST LUMPECTOMY WITH RADIOACTIVE SEED AND RIGHT AXILLARY DEEP SENTINEL LYMPH NODE BIOPSY WITH BLUE DYE INJECTION;  Surgeon: Claud Kelp, MD;  Location: [facility];  Service: General;  Laterality: Right;   BREAST LUMPECTOMY WITH RADIOACTIVE SEED LOCALIZATION Left 06/22/2017   Procedure: LEFT BREAST LUMPECTOMY WITH RADIOACTIVE SEED LOCALIZATION;  Surgeon: Claud Kelp, MD;  Location:  [facility];  Service: General;  Laterality: Left;   CATARACT EXTRACTION W/ INTRAOCULAR LENS  IMPLANT, BILATERAL Bilateral    EXCISION OF BREAST BIOPSY Left 06/22/2017   benign   LOOP RECORDER INSERTION N/A 05/16/2022   Procedure: LOOP RECORDER INSERTION;  Surgeon: Lanier Prude, MD;  Location: [facility];  Service: Cardiovascular;  Laterality: N/A;   PERCUTANEOUS PINNING Right 06/22/2022   Procedure: PERCUTANEOUS PINNING OF RIGHT HIP;  Surgeon: Bjorn Pippin, MD;  Location: [facility];  Service: Orthopedics;  Laterality: Right;   TONSILLECTOMY     age 66   Patient Active Problem List   Diagnosis Date Noted   Right spastic hemiplegia (HCC) 12/14/2022   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 05/11/2020   PVC (premature ventricular contraction) 05/11/2020   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  Carotid bruit 11/13/2019   Pure hypercholesterolemia 11/13/2019   Malignant neoplasm of upper-outer quadrant of right breast in female, estrogen receptor positive (HCC) 05/22/2017   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   Diplopia 09/17/2014   Dizziness and giddiness 09/17/2014   Essential hypertension 07/22/2014   Chest pain 07/22/2014   Family history of heart disease 07/22/2014    REFERRING DIAG:  R29.6 (ICD-10-CM) - Repeated falls, closed fracture of Rt hip    THERAPY DIAG:  Hemiplegia and hemiparesis following  cerebral infarction affecting right dominant side (HCC)  Muscle weakness (generalized)  Unsteadiness on feet  Other abnormalities of gait and mobility  Rationale for Evaluation and Treatment Rehabilitation  PERTINENT HISTORY: hypertension, breast cancer, hyperlipidemia, Lt hip fracture secondary to fall 06/2022, CVA 06/2022   PRECAUTIONS:  Fall, Rt breast cancer with node removal, osteoporosis    SUBJECTIVE:                                                                                                                                                                                      SUBJECTIVE STATEMENT:  Things are going well.    Pt spouse present during visit.   PAIN:  PAIN:  Are you having pain? Yes NPRS scale: 3-4/10 Pain location: Right shoulder PAIN TYPE: aching Aggravating factors: use Relieving factors: Medication    OBJECTIVE: (objective measures completed at initial evaluation unless otherwise dated)  DIAGNOSTIC FINDINGS: Rt hip fracture and ORIF     COGNITION: Overall cognitive status: Within functional limits for tasks assessed                         SENSATION: WFL  POSTURE: rounded shoulders, forward head, flexed trunk , and weight shift left   PALPATION: NA   LOWER EXTREMITY ROM: Rt hip limited by 50%, hamstrings limited by 50% bil.    LOWER EXTREMITY MMT:   MMT Right eval Right  09/27/22 Left eval Right 10/30/22 Right  01/17/23 Left 10/30/22 Left 01/17/23  Hip flexion 3+ 4- 4- 4- 4 4- 4+  Hip extension           Hip abduction 3-  4    4  Hip adduction           Hip internal rotation           Hip external rotation           Knee flexion 3- 4- 3+  4  4+  Knee extension 3+ 4 4- 4 painful hip 4+ 4- 4+  Ankle dorsiflexion 3+ 4- 4- 4-  4   Ankle plantarflexion  Ankle inversion           Ankle eversion            (Blank rows = not tested)   FUNCTIONAL TESTS:   Eval: Timed up and go (TUG): 1 min, 27 seconds   09/27/22: 5x  sit to stand: 29 seconds with use of Lt hand  TUG: 27 seconds   10/30/22: 5x sit to stand: 23 sec use of LT hand     TUG: 26 sec with standard cane   11/20/22: 5x sit to stand: 18.68 seconds    TUG: 19.45 without device    3 minute walk test: 225 feet with a standard cane   01/10/23: 5x sit to stand: 15.77 seconds   01/17/23: TUG with cane: 15.41 seconds with cane    3 min walk test: 313 feet with cane    03/07/23: 3 min walk 275 feet with single point cane   TUG: 16 seconds   03/14/23: 6 min walk test: 723 feet with standard cane    TUG: 14.16 without cane    5x sit to stand: 12.15 seconds without hand   03/28/23 TUG 11 seconds without cane  5x STS 10.6 seconds no Ues  04/18/2023: 6 min walk test:  532 ft with [facility]  05/09/23: 6 min walk test with SPC: 667 feet - RPE 5      TODAY'S TREATMENT:   DATE:  05/09/2023 6 min walk walk: 667 feet  Nustep level 3 x8 minutes with LE only. >55 spm PT present throughout to discuss status Farmers carry 6# Lt UE from gym mat table to mirror 2 laps Alternating step tap on 6" step 2x10 with CGA Leg Press (seat at 5) 55# 2x10, single leg: Lt 35#, Rt 15# 2x10 bil each Hurdles: step to with CGA by PT on gait belt Sit to stand: 5# 2x10 DATE:  05/03/2023 Nustep level 3 x8 minutes with LE only. >55 spm PT present throughout to discuss status FWD and lateral step ups with 6" step with unilateral UE support 2x10 bilat-fatigue on 2nd set  Box step around colored discs on the floor-CGA by gait belt  Farmers carry 6# Lt UE from gym mat table to mirror 2 laps Amb 6 min around PT gym using standard cane- verbal cues for step length Alternating step tap on 6" step 2x10 with CGA Leg Press (seat at 5) 50# 2x10, single leg: Lt 35#, Rt 15# 2x10 bil each Hurdles: step to with CGA by PT on gait belt  DATE:  04/25/2023 Nustep level 4 x7 minutes with LE only. PT present throughout to discuss status FWD and lateral step ups with 6" step with unilateral UE support 2x10  bilat-fatigue on 2nd set  Seated modified dead lift with 6# dumbbell x7 Farmers carry 6# L UE from gym mat table to mirror 2 laps Amb 6 min around PT gym- walked over hurdles in the while doing this.  Leg Press (seat at 5) 50# 2x10   PATIENT EDUCATION:  Education details: Access Code: [email or web address] Person educated: Patient Education method: Explanation, Demonstration, and Handouts Education comprehension: verbalized understanding and returned demonstration   HOME EXERCISE PROGRAM: Access Code: [email or web address] URL: [email or web address] Date: 09/27/2022 Prepared by: Tresa Endo  Exercises - Seated Long Arc Quad  - 3 x daily - 7 x weekly - 2 sets - 10 reps - 5 hold - Seated March   - 3 x daily - 7 x weekly - 3 sets - 10 reps - Seated  Heel Toe Raises   - 3 x daily - 7 x weekly - 2 sets - 10 reps - Seated Isometric Hip Adduction with Ball  - 3 x daily - 7 x weekly - 2 sets - 10 reps - Sit to Stand with Armchair  - 2 x daily - 7 x weekly - 2 sets - 5-10 reps - Standing Hip Abduction with Counter Support  - 1 x daily - 7 x weekly - 2 sets - 10 reps - Heel Raises with Counter Support  - 2 x daily - 7 x weekly - 2 sets - 10 reps  Patient Education - Walking with a Single [employer] - Modified 4 Point Gait Pattern  ASSESSMENT:   CLINICAL IMPRESSION:      Pt continues to improve endurance, balance and strength. Pt was able to keep NuStep >55 spm for 8 minutes again today. 6 min walk test is improved since last testing with 667 feet with standard cane.  PT provided guarding and cueing throughout session for safety.  Patient continues to progress towards goal related activities.  Patient will benefit from skilled PT to address the below impairments and improve overall function.   OBJECTIVE IMPAIRMENTS: Abnormal gait, decreased activity tolerance, decreased balance, decreased mobility, difficulty walking, decreased strength, decreased safety awareness, impaired perceived functional ability,  impaired flexibility, postural dysfunction, and pain.    ACTIVITY LIMITATIONS: carrying, lifting, sitting, standing, stairs, transfers, hygiene/grooming, and locomotion level   PARTICIPATION LIMITATIONS: meal prep, cleaning, laundry, driving, shopping, and community activity   PERSONAL FACTORS: Age, Past/current experiences, and 1-2 comorbidities: CVA, falls, Rt hip fracture with ORIF  are also affecting patient's functional outcome.    REHAB POTENTIAL: Good   CLINICAL DECISION MAKING: Evolving/moderate complexity   EVALUATION COMPLEXITY: Moderate     GOALS: Goals reviewed with patient? Yes   SHORT TERM GOALS: Target date: 09/06/2022   Be independent in initial HEP Baseline: Goal status: Goal met 08/14/22   2.  Improve LE strength to perform sit to stand with moderate Lt UE support Baseline:  Goal status: Goal met 08/28/22  3.  Perform TUG in < or = to 60 seconds to reduce falls risk Baseline: 27 seconds (09/27/22) Goal status: MET      LONG TERM GOALS: Target date:  05/28/23   Be independent in advanced HEP Baseline: independent in current HEP and further progress is needed as she becomes more stable (03/14/23)  Goal status: in progress    2.  Perform TUG in < or = to 13 seconds to reduce falls risk Baseline: 14.16 seconds without cane (03/14/23) Goal status: IN PROGRESS   3.  ambulate > or = to 875 feet in 6 minutes to improve community ambulation and independence  Baseline: 667 (05/09/23) Goal status: REVISED    4.  Ambulate with hemi walker with supervision or no guard due to improved gait Baseline: able to do this Goal status: MET   5.  perform 5x sit to stand in < or = to 12 seconds to reduce falls risk  Baseline: 12.15 seconds (03/14/23)  Goal Status: In progress   6. Ambulate without device for home distances 100% of the time due to improved stability and reduced falls risk  Baseline: using cane 40% when fatigued (03/14/23)  Goal status: in progress   7.  Improve balance to walk her dog short distances with independence   Baseline: walking driveway with dog (05/09/23)  Goal status: NEW     PLAN:  PT FREQUENCY: 1x/week   PT DURATION: 10 weeks   PLANNED INTERVENTIONS: Therapeutic exercises, Therapeutic activity, Neuromuscular re-education, Balance training, Gait training, Patient/Family education, Self Care, Joint mobilization, Stair training, Dry Needling, Electrical stimulation, Cryotherapy, Moist heat, Taping, Manual therapy, and Re-evaluation   PLAN FOR NEXT SESSION: Continue to work on gait , strength and balance. Endurance, change of direction.    Lorrene Reid, PT 05/09/23 2:43 PM    [facility] [address], [address] [place], [place] [address] Phone # [phone] Fax [phone]

## 2023-05-10 ENCOUNTER — Ambulatory Visit: Payer: Medicare PPO | Admitting: Occupational Therapy

## 2023-05-14 ENCOUNTER — Ambulatory Visit: Payer: Medicare PPO | Admitting: Occupational Therapy

## 2023-05-14 DIAGNOSIS — I69351 Hemiplegia and hemiparesis following cerebral infarction affecting right dominant side: Secondary | ICD-10-CM | POA: Diagnosis not present

## 2023-05-14 DIAGNOSIS — M6281 Muscle weakness (generalized): Secondary | ICD-10-CM | POA: Diagnosis not present

## 2023-05-14 DIAGNOSIS — R278 Other lack of coordination: Secondary | ICD-10-CM | POA: Diagnosis not present

## 2023-05-14 DIAGNOSIS — M25611 Stiffness of right shoulder, not elsewhere classified: Secondary | ICD-10-CM | POA: Diagnosis not present

## 2023-05-14 DIAGNOSIS — R2681 Unsteadiness on feet: Secondary | ICD-10-CM | POA: Diagnosis not present

## 2023-05-14 NOTE — Therapy (Signed)
OUTPATIENT OCCUPATIONAL THERAPY NEURO  Treatment Note  Patient Name: Angela Buchanan MRN: [id number] DOB:[birth year], 72 y.o., female Today's Date: 05/14/2023  PCP: Shon Hale, MD REFERRING PROVIDER: Shon Hale, MD  END OF SESSION:  OT End of Session - 05/14/23 1613     Visit Number 6    Number of Visits 17    Date for OT Re-Evaluation 06/15/23    Authorization Type [employer]    OT Start Time 1404    OT Stop Time 1446    OT Time Calculation (min) 42 min    Behavior During Therapy [facility] for tasks assessed/performed                  Past Medical History:  Diagnosis Date   Atypical chest pain 07/19/2021   Bigeminy    no current med.   Breast cancer (HCC) 06/2017   right   Bruises easily    CAD in native artery 10/31/2022   Carotid bruit 11/13/2019   Dental crowns present    History of diverticulitis    Hypertension    states under control with med., has been on med. x 5 yr.   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   Past Surgical History:  Procedure Laterality Date   ABDOMINAL HYSTERECTOMY     partial   BREAST LUMPECTOMY Right 06/22/2017   Malignant   BREAST LUMPECTOMY WITH RADIOACTIVE SEED AND SENTINEL LYMPH NODE BIOPSY Right 06/22/2017   Procedure: RIGHT BREAST LUMPECTOMY WITH RADIOACTIVE SEED AND RIGHT AXILLARY DEEP SENTINEL LYMPH NODE BIOPSY WITH BLUE DYE INJECTION;  Surgeon: Claud Kelp, MD;  Location: [facility];  Service: General;  Laterality: Right;   BREAST LUMPECTOMY WITH RADIOACTIVE SEED LOCALIZATION Left 06/22/2017   Procedure: LEFT BREAST LUMPECTOMY WITH RADIOACTIVE SEED LOCALIZATION;  Surgeon: Claud Kelp, MD;  Location: [facility];   Service: General;  Laterality: Left;   CATARACT EXTRACTION W/ INTRAOCULAR LENS  IMPLANT, BILATERAL Bilateral    EXCISION OF BREAST BIOPSY Left 06/22/2017   benign   LOOP RECORDER INSERTION N/A 05/16/2022   Procedure: LOOP RECORDER INSERTION;  Surgeon: Lanier Prude, MD;  Location: [facility];  Service: Cardiovascular;  Laterality: N/A;   PERCUTANEOUS PINNING Right 06/22/2022   Procedure: PERCUTANEOUS PINNING OF RIGHT HIP;  Surgeon: Bjorn Pippin, MD;  Location: [facility];  Service: Orthopedics;  Laterality: Right;   TONSILLECTOMY     age 72   Patient Active Problem List   Diagnosis Date Noted   Right spastic hemiplegia (HCC) 12/14/2022   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 05/11/2020   PVC (premature ventricular contraction) 05/11/2020   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   Carotid bruit 11/13/2019   Pure hypercholesterolemia 11/13/2019  Malignant neoplasm of upper-outer quadrant of right breast in female, estrogen receptor positive (HCC) 05/22/2017   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   Diplopia 09/17/2014   Dizziness and giddiness 09/17/2014   Essential hypertension 07/22/2014   Chest pain 07/22/2014   Family history of heart disease 07/22/2014    ONSET DATE: 05/13/22 (new referral 04/09/23)  REFERRING DIAG: G83.23 (ICD-10-CM) - 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 Pt accompanied by: self and significant other ([name])  PERTINENT HISTORY: hypertension, breast cancer, hyperlipidemia, Lt hip fracture secondary to fall 06/2022, CVA 05/2022    PRECAUTIONS: Fall, Rt breast cancer with node removal, osteoporosis   WEIGHT BEARING RESTRICTIONS: No  PAIN:  Are you having pain? No  FALLS: Has patient fallen in last 6 months? No  LIVING ENVIRONMENT: Lives with: lives with their spouse Lives in: House/apartment Stairs: Yes: External: 2 steps; on left going up Has following equipment at home: Quad cane large base, Environmental consultant - 2 wheeled, Hemi walker, Wheelchair (manual), shower chair, and Grab bars  PLOF: Independent and Independent with basic ADLs  PATIENT GOALS: to have better use my arm and hand  OBJECTIVE:   HAND DOMINANCE: Right  ADLs:  Transfers/ambulation related to ADLs: Utilizes [facility] for mobility in the community, 80% in the house but will walk without occasionally Eating: husband cuts foods, utilizing built up handles for self-feeding Grooming: needs assistance with washing hair UB Dressing: needs assistance with fastening bra LB Dressing: Mod-max assist for donning pants due to fear of falling, able to don socks and slip on shoes - unable to tie shoes Toileting: Mod I Bathing: Supervision Tub Shower transfers: Supervision, but able to complete transfers with use of grab bars Equipment: Shower seat with back, Grab bars, and Walk in shower  IADLs: Handwriting:  TBA at next session  MOBILITY  STATUS: Needs Assist: utilized [facility] for mobility and Hx of falls  POSTURE COMMENTS:  rounded shoulders and forward head  ACTIVITY TOLERANCE: Activity tolerance: fatigues with increased mobility and effort  FUNCTIONAL OUTCOME MEASURES: Upper Extremity Functional Scale (UEFS): TBD  UPPER EXTREMITY ROM:    Active ROM Right eval Left eval  Shoulder flexion 87   Shoulder abduction 78   Shoulder adduction    Shoulder extension    Shoulder internal rotation 90%   Shoulder external rotation 50%   Elbow flexion 132   Elbow extension -17   Wrist flexion 19   Wrist extension 35   Wrist ulnar deviation 15   Wrist radial deviation 13   Wrist pronation WNL   Wrist supination 20 from neutral   (Blank rows = not tested)  UPPER EXTREMITY MMT:     MMT Right eval Left eval  Shoulder flexion    Shoulder abduction    Shoulder adduction    Shoulder extension    Shoulder internal rotation    Shoulder external rotation    Middle trapezius    Lower trapezius    Elbow flexion    Elbow extension    Wrist flexion    Wrist extension  Wrist ulnar deviation    Wrist radial deviation    Wrist pronation    Wrist supination    (Blank rows = not tested)    Active ROM Extension (as flexion to loose gross grasp) Right eval Left 05/14/2023  Thumb MCP (0-60)    Thumb IP (0-80)    Thumb Radial abd/add (0-55)    Thumb Palmar abd/add (0-45)    Thumb opposition to index    Index MCP (0-90) 0   Index PIP (0-100) -30   Index DIP (0-70) -40   Long MCP (0-90) -5   Long PIP (0-100) -45   Long DIP (0-70) -5   Ring MCP (0-90_ 0   Ring PIP (0-100) -40   Ring DIP (0-70) -20   Little MCP (0-90) -7   Little PIP (0-100) -25   Little DIP (0-70) -5   (Blank rows = not tested)    HAND FUNCTION: Grip strength: Right: 7,9 = 8# lbs; Left: 30 lbs  COORDINATION: Finger Nose Finger test: slow and deliberate with RUE, but able to touch both nose and finger at this time 9 Hole Peg test: Right:  unable to pick up any pegs with RUE  Box and Blocks:  Right 16 blocks, Left 55 blocks  SENSATION: Light touch: WFL Hot/Cold: WFL  EDEMA: no swelling on eval  MUSCLE TONE: RUE: Mild and Hypertonic  COGNITION: Overall cognitive status: Within functional limits for tasks assessed  VISION: Subjective report: pt reports wearing glasses more since CVA Baseline vision: Wears glasses all the time, double vision after cataract removal Visual history: cataracts  OBSERVATIONS: Pt attempting grasp and pinch activities, frequently not incorporating index finger on R with increased flexion away from task.   TODAY'S TREATMENT:                                                           05/14/23 Wrist ROM: engaged in wrist flexion/extension with attempts to complete against resistance with velcro board, however pt with increased difficulty.  Therefore OT transitioned to attempting with medium sized weighted ball.  Pt then demonstrating improved ability to complete wrist flexion and extension with use of ball for increased stretch.  Transitioned to supination/pronation with ball with OT providing verbal and intermittent tactile cues to decrease lean and shoulder hike during ROM.   Functional reach: engaged in reach for cones to low surface and then place on elevated surface to facilitate increased ROM and hand opening for grasp/release.  Pt continues to demonstrate decreased active wrist extension as well as finger extension to fully open to grasp cone without assist from opposite hand to further open.    05/07/23 UE ROM: finger PIP flexion with OT providing demonstration and verbal cues for technique with positioning of LUE to facilitate increased blocking and stabilization during flexion.  Engaged in wrist extension on dowel to further facilitate increased extension, attempted with towel initially however pt requiring more rigid surface.   NMR: chest press and shoulder flexion ~90* with dowel with BUE to  facilitate increased shoulder flexion, wrist extension, and symmetry.   Pt demonstrating decreased wrist extension, frequently compensating with R shoulder elevation and changing hand placement on dowel to compensate for decreased wrist extension.  OT bringing attention to hand placement and tactile cues at shoulder to facilitate increased symmetry.  UE Functional use: engaged in picking up large foam blocks from lower surface to challenge functional reach with elbow extension and grasp/release with obtain and release blocks.  Pt demonstrating decreased index finger flexion and extension, requiring mod cues to attend to index finger during grasp.   05/03/23 Peg board: engaged in picking up large grip pegs from table top and rotating in palm of hands (unable to rotate in finger tips) to place into resistive peg board for coordination and strength.  Pt demonstrating loose gross grasp, index finger still not overly engaged in grasp, however utilizing with min cues to rotate peg into place. Pt then removing pegs to challenge grip strength and then reaching across midline to place into container with small opening for increased precision. Handwriting: engaged in writing name x3 with use of large built up foam handle on pen.  Pt continues to demonstrate decreased sustained grasp and motor control to write without foam handle. Coordination/UE ROM: Attempted pen tricks, however unable to rotate pen in between fingers, therefore transitioned to blocked DIP and PIP flexion and extension of index finger.  OT providing assistance and cues for setup and blocking at each joint, fading to pt completing with only initial cues.  Exercises - Seated Isolated Finger PIP Flexion AROM  - 2-3 x daily - 10 reps - Seated Finger DIP Extension PROM  - 2-3 x daily - 10 reps - 5 hold - Seated Finger PIP Extension PROM  - 2-3 x daily - 10 reps - 5 hold   PATIENT EDUCATION: Education details: ongoing condition specific  education. Person educated: Patient and Spouse Education method: Explanation, Demonstration, and Verbal cues Education comprehension: verbalized understanding and needs further education  HOME EXERCISE PROGRAM: Access Code: [email or web address] URL: [email or web address] Date: 04/23/2023 Prepared by: [facility]    GOALS: Goals reviewed with patient? Yes  SHORT TERM GOALS: Target date: 05/18/23  Pt and spouse will be independent with HEP for ROM, strengthening, and coordination.  Baseline: Goal status: IN PROGRESS  2.  Pt will verbalize understanding of task modifications and/or potential AE needs to increase ease, safety, and independence w/ ADLs.  Baseline: Goal status: IN PROGRESS  LONG TERM GOALS: Target date: 06/15/23  Pt will demonstrate improved functional grasp in RUE by 6# to open new jar, increased handwriting, and self-feeding.  Baseline: R: 8#, L: 30# Goal status: IN PROGRESS  2.  Pt will demonstrate improved UE functional use for ADLs as evidenced by increasing box/ blocks score by 6 blocks with RUE. Baseline: R: 16 blocks, L: 55 blocks Goal status: IN PROGRESS  3.  Pt will demonstrate improved shoulder ROM (shoulder flexion and external rotation) as needed for ADLs/IADLs (washing hair, putting on jacket, etc).  Baseline:  Goal status: IN PROGRESS  4.  Pt will demonstrate improvement of finger extension by 15* TAM of index and long ringer finger to allow for more functional hand opening.  Baseline: -70 IF, -55 LF Goal status: IN PROGRESS  5.  Pt will demonstrate improved handwriting with ability to write name and simple grocery list with 90% legibility. Baseline:  Goal status: IN PROGRESS  6.  Pt will demonstrate improvements in functional use of dominant RUE as evidenced by improvements on FOTO. Baseline: TBD Goal status: IN PROGRESS  ASSESSMENT:  CLINICAL IMPRESSION: Pt benefiting from cues for positioning to  decrease compensatory shoulder hike and trunk rotation during UE ROM and attempts at functional grasp and release. Pt continues to demonstrate  decreased finger flexion/extension, especially with index finger both during structured exercises and attempts at functional tasks.  Pt responded well to wrist flexion/extension exercise with ball to progress towards incorporating more AROM.  PERFORMANCE DEFICITS: in functional skills including ADLs, IADLs, coordination, dexterity, tone, ROM, strength, pain, flexibility, Fine motor control, Gross motor control, mobility, balance, body mechanics, endurance, decreased knowledge of precautions, decreased knowledge of use of DME, and UE functional use and psychosocial skills including environmental adaptation and routines and behaviors.   IMPAIRMENTS: are limiting patient from ADLs and IADLs.   CO-MORBIDITIES: may have co-morbidities  that affects occupational performance. Patient will benefit from skilled OT to address above impairments and improve overall function.  MODIFICATION OR ASSISTANCE TO COMPLETE EVALUATION: Min-Moderate modification of tasks or assist with assess necessary to complete an evaluation.  OT OCCUPATIONAL PROFILE AND HISTORY: Detailed assessment: Review of records and additional review of physical, cognitive, psychosocial history related to current functional performance.  CLINICAL DECISION MAKING: Moderate - several treatment options, min-mod task modification necessary  REHAB POTENTIAL: Good  EVALUATION COMPLEXITY: Moderate    PLAN:  OT FREQUENCY: 2x/week  OT DURATION: 8 weeks  PLANNED INTERVENTIONS: self care/ADL training, therapeutic exercise, therapeutic activity, neuromuscular re-education, manual therapy, passive range of motion, functional mobility training, splinting, electrical stimulation, ultrasound, compression bandaging, moist heat, cryotherapy, patient/family education, psychosocial skills training, energy  conservation, coping strategies training, and DME and/or AE instructions  RECOMMENDED OTHER SERVICES: NA  CONSULTED AND AGREED WITH PLAN OF CARE: Patient and family member/caregiver  PLAN FOR NEXT SESSION: complete UEFS or FOTO, initiate gross grasp tasks, further address wrist flexion/extension, supination, and finger flexion and extension     , , OTR/L 05/14/2023, 4:14 PM

## 2023-05-16 ENCOUNTER — Ambulatory Visit: Payer: Medicare PPO

## 2023-05-16 DIAGNOSIS — I69351 Hemiplegia and hemiparesis following cerebral infarction affecting right dominant side: Secondary | ICD-10-CM

## 2023-05-16 DIAGNOSIS — R2681 Unsteadiness on feet: Secondary | ICD-10-CM | POA: Diagnosis not present

## 2023-05-16 DIAGNOSIS — R2689 Other abnormalities of gait and mobility: Secondary | ICD-10-CM

## 2023-05-16 DIAGNOSIS — M6281 Muscle weakness (generalized): Secondary | ICD-10-CM | POA: Diagnosis not present

## 2023-05-16 NOTE — Therapy (Signed)
OUTPATIENT PHYSICAL THERAPY TREATMENT NOTE   Patient Name: Angela Buchanan MRN: [id number] DOB:1951-03-20, 72 y.o., female Today's Date: 05/16/2023  PCP:  Garth Bigness, MD   REFERRING PROVIDER:  Garth Bigness, MD      END OF SESSION:   PT End of Session - 05/16/23 1447     Visit Number 37    Date for PT Re-Evaluation 05/28/23    Authorization Type [employer]    Authorization Time Period 03/19/2023 - 05/28/2023    Authorization - Visit Number 8    Authorization - Number of Visits 10    Progress Note Due on Visit 40    PT Start Time 1402    PT Stop Time 1447    PT Time Calculation (min) 45 min    Activity Tolerance Patient tolerated treatment well    Behavior During Therapy [facility] for tasks assessed/performed                    Past Medical History:  Diagnosis Date   Atypical chest pain 07/19/2021   Bigeminy    no current med.   Breast cancer (HCC) 06/2017   right   Bruises easily    CAD in native artery 10/31/2022   Carotid bruit 11/13/2019   Dental crowns present    History of diverticulitis    Hypertension    states under control with med., has been on med. x 5 yr.   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   Past Surgical History:  Procedure Laterality Date   ABDOMINAL HYSTERECTOMY     partial   BREAST LUMPECTOMY Right 06/22/2017   Malignant   BREAST LUMPECTOMY WITH RADIOACTIVE SEED AND SENTINEL LYMPH NODE BIOPSY Right 06/22/2017   Procedure: RIGHT BREAST LUMPECTOMY WITH RADIOACTIVE SEED AND RIGHT AXILLARY DEEP SENTINEL LYMPH NODE BIOPSY WITH BLUE DYE INJECTION;  Surgeon: Claud Kelp, MD;  Location: [facility];  Service: General;  Laterality: Right;   BREAST LUMPECTOMY  WITH RADIOACTIVE SEED LOCALIZATION Left 06/22/2017   Procedure: LEFT BREAST LUMPECTOMY WITH RADIOACTIVE SEED LOCALIZATION;  Surgeon: Claud Kelp, MD;  Location: [facility];  Service: General;  Laterality: Left;   CATARACT EXTRACTION W/ INTRAOCULAR LENS  IMPLANT, BILATERAL Bilateral    EXCISION OF BREAST BIOPSY Left 06/22/2017   benign   LOOP RECORDER INSERTION N/A 05/16/2022   Procedure: LOOP RECORDER INSERTION;  Surgeon: Lanier Prude, MD;  Location: [facility];  Service: Cardiovascular;  Laterality: N/A;   PERCUTANEOUS PINNING Right 06/22/2022   Procedure: PERCUTANEOUS PINNING OF RIGHT HIP;  Surgeon: Bjorn Pippin, MD;  Location: [facility];  Service: Orthopedics;  Laterality: Right;   TONSILLECTOMY     age 30   Patient Active Problem List   Diagnosis Date Noted   Right spastic hemiplegia (HCC) 12/14/2022   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   CAD in native artery 10/31/2022   Malnutrition of moderate degree 06/22/2022   Closed fracture of femur, neck (HCC) 06/21/2022   Urinary frequency 06/21/2022   Hypokalemia 06/21/2022   Heart murmur 06/21/2022   Hypothyroidism 06/21/2022   Dysphagia 05/17/2022   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   CVA (cerebral vascular accident) (HCC) 05/12/2022   Osteoporosis 01/24/2022   Atypical chest pain 07/19/2021  Temporomandibular joint (TMJ) pain 07/16/2020   Referred otalgia of both ears 07/16/2020   PAC (premature atrial contraction) 05/11/2020   PVC (premature ventricular contraction) 05/11/2020   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   Carotid bruit 11/13/2019   Pure hypercholesterolemia 11/13/2019   Malignant neoplasm of upper-outer quadrant of right breast in female, estrogen receptor positive (HCC) 05/22/2017   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   Diplopia 09/17/2014   Dizziness and giddiness 09/17/2014   Essential hypertension 07/22/2014   Chest pain 07/22/2014   Family history of heart  disease 07/22/2014    REFERRING DIAG:  R29.6 (ICD-10-CM) - Repeated falls, closed fracture of Rt hip    THERAPY DIAG:  Hemiplegia and hemiparesis following cerebral infarction affecting right dominant side (HCC)  Muscle weakness (generalized)  Unsteadiness on feet  Other abnormalities of gait and mobility  Rationale for Evaluation and Treatment Rehabilitation  PERTINENT HISTORY: hypertension, breast cancer, hyperlipidemia, Lt hip fracture secondary to fall 06/2022, CVA 06/2022   PRECAUTIONS:  Fall, Rt breast cancer with node removal, osteoporosis    SUBJECTIVE:                                                                                                                                                                                      SUBJECTIVE STATEMENT:  Things are going well.    Pt spouse present during visit.   PAIN:  PAIN:  Are you having pain? Yes NPRS scale: 3-4/10 Pain location: Right shoulder PAIN TYPE: aching Aggravating factors: use Relieving factors: Medication    OBJECTIVE: (objective measures completed at initial evaluation unless otherwise dated)  DIAGNOSTIC FINDINGS: Rt hip fracture and ORIF     COGNITION: Overall cognitive status: Within functional limits for tasks assessed                         SENSATION: WFL  POSTURE: rounded shoulders, forward head, flexed trunk , and weight shift left   PALPATION: NA   LOWER EXTREMITY ROM: Rt hip limited by 50%, hamstrings limited by 50% bil.    LOWER EXTREMITY MMT:   MMT Right eval Right  09/27/22 Left eval Right 10/30/22 Right  01/17/23 Left 10/30/22 Left 01/17/23  Hip flexion 3+ 4- 4- 4- 4 4- 4+  Hip extension           Hip abduction 3-  4    4  Hip adduction           Hip internal rotation           Hip external rotation           Knee flexion 3- 4- 3+  4  4+  Knee extension 3+ 4 4- 4 painful hip 4+ 4- 4+  Ankle dorsiflexion 3+ 4- 4- 4-  4   Ankle plantarflexion           Ankle  inversion           Ankle eversion            (Blank rows = not tested)   FUNCTIONAL TESTS:   Eval: Timed up and go (TUG): 1 min, 27 seconds   09/27/22: 5x sit to stand: 29 seconds with use of Lt hand  TUG: 27 seconds   10/30/22: 5x sit to stand: 23 sec use of LT hand     TUG: 26 sec with standard cane   11/20/22: 5x sit to stand: 18.68 seconds    TUG: 19.45 without device    3 minute walk test: 225 feet with a standard cane   01/10/23: 5x sit to stand: 15.77 seconds   01/17/23: TUG with cane: 15.41 seconds with cane    3 min walk test: 313 feet with cane    03/07/23: 3 min walk 275 feet with single point cane   TUG: 16 seconds   03/14/23: 6 min walk test: 723 feet with standard cane    TUG: 14.16 without cane    5x sit to stand: 12.15 seconds without hand   03/28/23 TUG 11 seconds without cane  5x STS 10.6 seconds no Ues  04/18/2023: 6 min walk test:  532 ft with [facility]  05/09/23: 6 min walk test with SPC: 667 feet - RPE 5      TODAY'S TREATMENT:   DATE:  05/16/2023 Nustep level 3 x8 minutes with LE only. >55 spm PT present throughout to discuss status Farmers carry 6# Lt UE from gym mat table to mirror 2 laps Alternating step tap on 6" step 2x10 with CGA Leg Press (seat at 5) 55# 2x10, single leg: Lt 35#, Rt 15# 2x10 bil each Hurdles: step to with CGA by PT on gait belt Sit to stand: 5# 2x10  DATE:  05/09/2023 6 min walk walk: 667 feet  Nustep level 3 x8 minutes with LE only. >55 spm PT present throughout to discuss status Farmers carry 6# Lt UE from gym mat table to mirror 2 laps Alternating step tap on 6" step 2x10 with CGA Leg Press (seat at 5) 55# 2x10, single leg: Lt 35#, Rt 15# 2x10 bil each Hurdles: step to with SBA and no hand held assistance  Standing at barre and reaching on wall to various letters and numbers - added cognitive task and had pt name animal that starts with letter she touched Sit to stand: 6# 2x10 DATE:  05/03/2023 Nustep level 3 x8 minutes with LE  only. >55 spm PT present throughout to discuss status FWD and lateral step ups with 6" step with unilateral UE support 2x10 bilat-fatigue on 2nd set  Box step around colored discs on the floor-CGA by gait belt  Farmers carry 6# Lt UE from gym mat table to mirror 2 laps Amb 6 min around PT gym using standard cane- verbal cues for step length Alternating step tap on 6" step 2x10 with CGA Leg Press (seat at 5) 50# 2x10, single leg: Lt 35#, Rt 15# 2x10 bil each Hurdles: step to with CGA by PT on gait belt  PATIENT EDUCATION:  Education details: Access Code: [email or web address] Person educated: Patient Education method: Explanation, Demonstration, and Handouts Education comprehension: verbalized understanding and returned demonstration  HOME EXERCISE PROGRAM: Access Code: [email or web address] URL: [email or web address] Date: 09/27/2022 Prepared by: Tresa Endo  Exercises - Seated Long Arc Quad  - 3 x daily - 7 x weekly - 2 sets - 10 reps - 5 hold - Seated March   - 3 x daily - 7 x weekly - 3 sets - 10 reps - Seated Heel Toe Raises   - 3 x daily - 7 x weekly - 2 sets - 10 reps - Seated Isometric Hip Adduction with Ball  - 3 x daily - 7 x weekly - 2 sets - 10 reps - Sit to Stand with Armchair  - 2 x daily - 7 x weekly - 2 sets - 5-10 reps - Standing Hip Abduction with Counter Support  - 1 x daily - 7 x weekly - 2 sets - 10 reps - Heel Raises with Counter Support  - 2 x daily - 7 x weekly - 2 sets - 10 reps  Patient Education - Walking with a Single [employer] - Modified 4 Point Gait Pattern  ASSESSMENT:   CLINICAL IMPRESSION:      Pt is making steady progress and demonstrates improved independence and stability overall.  She requires less guarding with balance tasks.  PT incorporated cognitive skill with balance today.  She continues to use quad cane for short distances and does well with standard cane in the clinic.  She doesn't use device at home.  Increased weight with sit to stand today.    Patient will benefit from skilled PT to address the below impairments and improve overall function.   OBJECTIVE IMPAIRMENTS: Abnormal gait, decreased activity tolerance, decreased balance, decreased mobility, difficulty walking, decreased strength, decreased safety awareness, impaired perceived functional ability, impaired flexibility, postural dysfunction, and pain.    ACTIVITY LIMITATIONS: carrying, lifting, sitting, standing, stairs, transfers, hygiene/grooming, and locomotion level   PARTICIPATION LIMITATIONS: meal prep, cleaning, laundry, driving, shopping, and community activity   PERSONAL FACTORS: Age, Past/current experiences, and 1-2 comorbidities: CVA, falls, Rt hip fracture with ORIF  are also affecting patient's functional outcome.    REHAB POTENTIAL: Good   CLINICAL DECISION MAKING: Evolving/moderate complexity   EVALUATION COMPLEXITY: Moderate     GOALS: Goals reviewed with patient? Yes   SHORT TERM GOALS: Target date: 09/06/2022   Be independent in initial HEP Baseline: Goal status: Goal met 08/14/22   2.  Improve LE strength to perform sit to stand with moderate Lt UE support Baseline:  Goal status: Goal met 08/28/22  3.  Perform TUG in < or = to 60 seconds to reduce falls risk Baseline: 27 seconds (09/27/22) Goal status: MET      LONG TERM GOALS: Target date:  05/28/23   Be independent in advanced HEP Baseline: independent in current HEP and further progress is needed as she becomes more stable (03/14/23)  Goal status: in progress    2.  Perform TUG in < or = to 13 seconds to reduce falls risk Baseline: 14.16 seconds without cane (03/14/23) Goal status: IN PROGRESS   3.  ambulate > or = to 875 feet in 6 minutes to improve community ambulation and independence  Baseline: 667 (05/09/23) Goal status: REVISED    4.  Ambulate with hemi walker with supervision or no guard due to improved gait Baseline: able to do this Goal status: MET   5.  perform 5x sit to  stand in < or = to 12 seconds to reduce falls risk  Baseline: 12.15 seconds (03/14/23)  Goal Status: In  progress   6. Ambulate without device for home distances 100% of the time due to improved stability and reduced falls risk  Baseline: using cane 40% when fatigued (03/14/23)  Goal status: in progress   7. Improve balance to walk her dog short distances with independence   Baseline: walking driveway with dog (05/09/23)  Goal status: NEW     PLAN:   PT FREQUENCY: 1x/week   PT DURATION: 10 weeks   PLANNED INTERVENTIONS: Therapeutic exercises, Therapeutic activity, Neuromuscular re-education, Balance training, Gait training, Patient/Family education, Self Care, Joint mobilization, Stair training, Dry Needling, Electrical stimulation, Cryotherapy, Moist heat, Taping, Manual therapy, and Re-evaluation   PLAN FOR NEXT SESSION: Continue to work on gait , strength and balance. Endurance, change of direction. ERO next week and submit for visits    Lorrene Reid, PT 05/16/23 2:48 PM    [facility] [address] [place], [place] [address] Phone # [phone] Fax [phone]

## 2023-05-17 ENCOUNTER — Ambulatory Visit: Payer: Medicare PPO | Admitting: Occupational Therapy

## 2023-05-17 DIAGNOSIS — R2681 Unsteadiness on feet: Secondary | ICD-10-CM | POA: Diagnosis not present

## 2023-05-17 DIAGNOSIS — R278 Other lack of coordination: Secondary | ICD-10-CM | POA: Diagnosis not present

## 2023-05-17 DIAGNOSIS — M25611 Stiffness of right shoulder, not elsewhere classified: Secondary | ICD-10-CM | POA: Diagnosis not present

## 2023-05-17 DIAGNOSIS — I69351 Hemiplegia and hemiparesis following cerebral infarction affecting right dominant side: Secondary | ICD-10-CM | POA: Diagnosis not present

## 2023-05-17 DIAGNOSIS — M6281 Muscle weakness (generalized): Secondary | ICD-10-CM | POA: Diagnosis not present

## 2023-05-17 NOTE — Progress Notes (Signed)
[place] [facility] [employer] 

## 2023-05-17 NOTE — Therapy (Signed)
OUTPATIENT OCCUPATIONAL THERAPY NEURO  Treatment Note  Patient Name: Angela Buchanan MRN: [id number] DOB:12-07-1950, 72 y.o., female Today's Date: 05/17/2023  PCP: Angela Hale, MD REFERRING PROVIDER: Shon Hale, MD  END OF SESSION:  OT End of Session - 05/17/23 1450     Visit Number 7    Number of Visits 17    Date for OT Re-Evaluation 06/15/23    Authorization Type [employer]    OT Start Time 1447    OT Stop Time 1530    OT Time Calculation (min) 43 min    Behavior During Therapy [facility] for tasks assessed/performed                   Past Medical History:  Diagnosis Date   Atypical chest pain 07/19/2021   Bigeminy    no current med.   Breast cancer (HCC) 06/2017   right   Bruises easily    CAD in native artery 10/31/2022   Carotid bruit 11/13/2019   Dental crowns present    History of diverticulitis    Hypertension    states under control with med., has been on med. x 5 yr.   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   Past Surgical History:  Procedure Laterality Date   ABDOMINAL HYSTERECTOMY     partial   BREAST LUMPECTOMY Right 06/22/2017   Malignant   BREAST LUMPECTOMY WITH RADIOACTIVE SEED AND SENTINEL LYMPH NODE BIOPSY Right 06/22/2017   Procedure: RIGHT BREAST LUMPECTOMY WITH RADIOACTIVE SEED AND RIGHT AXILLARY DEEP SENTINEL LYMPH NODE BIOPSY WITH BLUE DYE INJECTION;  Surgeon: Claud Kelp, MD;  Location: [facility];  Service: General;  Laterality: Right;   BREAST LUMPECTOMY WITH RADIOACTIVE SEED LOCALIZATION Left 06/22/2017   Procedure: LEFT BREAST LUMPECTOMY WITH RADIOACTIVE SEED LOCALIZATION;  Surgeon: Claud Kelp, MD;  Location: [facility];   Service: General;  Laterality: Left;   CATARACT EXTRACTION W/ INTRAOCULAR LENS  IMPLANT, BILATERAL Bilateral    EXCISION OF BREAST BIOPSY Left 06/22/2017   benign   LOOP RECORDER INSERTION N/A 05/16/2022   Procedure: LOOP RECORDER INSERTION;  Surgeon: Lanier Prude, MD;  Location: [facility];  Service: Cardiovascular;  Laterality: N/A;   PERCUTANEOUS PINNING Right 06/22/2022   Procedure: PERCUTANEOUS PINNING OF RIGHT HIP;  Surgeon: Bjorn Pippin, MD;  Location: [facility];  Service: Orthopedics;  Laterality: Right;   TONSILLECTOMY     age 56   Patient Active Problem List   Diagnosis Date Noted   Right spastic hemiplegia (HCC) 12/14/2022   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 05/11/2020   PVC (premature ventricular contraction) 05/11/2020   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   Carotid bruit 11/13/2019   Pure hypercholesterolemia 11/13/2019  Malignant neoplasm of upper-outer quadrant of right breast in female, estrogen receptor positive (HCC) 05/22/2017   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   Diplopia 09/17/2014   Dizziness and giddiness 09/17/2014   Essential hypertension 07/22/2014   Chest pain 07/22/2014   Family history of heart disease 07/22/2014    ONSET DATE: 05/13/22 (new referral 04/09/23)  REFERRING DIAG: G83.23 (ICD-10-CM) - 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 Pt accompanied by: self and significant other ([name])  PERTINENT HISTORY: hypertension, breast cancer, hyperlipidemia, Lt hip fracture secondary to fall 06/2022, CVA 05/2022    PRECAUTIONS: Fall, Rt breast cancer with node removal, osteoporosis   WEIGHT BEARING RESTRICTIONS: No  PAIN:  Are you having pain? No  FALLS: Has patient fallen in last 6 months? No  LIVING ENVIRONMENT: Lives with: lives with their spouse Lives in: House/apartment Stairs: Yes: External: 2 steps; on left going up Has following equipment at home: Quad cane large base, Environmental consultant - 2 wheeled, Hemi walker, Wheelchair (manual), shower chair, and Grab bars  PLOF: Independent and Independent with basic ADLs  PATIENT GOALS: to have better use my arm and hand  OBJECTIVE:   HAND DOMINANCE: Right  ADLs:  Transfers/ambulation related to ADLs: Utilizes [facility] for mobility in the community, 80% in the house but will walk without occasionally Eating: husband cuts foods, utilizing built up handles for self-feeding Grooming: needs assistance with washing hair UB Dressing: needs assistance with fastening bra LB Dressing: Mod-max assist for donning pants due to fear of falling, able to don socks and slip on shoes - unable to tie shoes Toileting: Mod I Bathing: Supervision Tub Shower transfers: Supervision, but able to complete transfers with use of grab bars Equipment: Shower seat with back, Grab bars, and Walk in shower  IADLs: Handwriting:  TBA at next session  MOBILITY STATUS: Needs Assist: utilized [facility] for mobility and Hx of falls  POSTURE COMMENTS:   rounded shoulders and forward head  ACTIVITY TOLERANCE: Activity tolerance: fatigues with increased mobility and effort  FUNCTIONAL OUTCOME MEASURES: Upper Extremity Functional Scale (UEFS): TBD  UPPER EXTREMITY ROM:    Active ROM Right eval Left eval  Shoulder flexion 87   Shoulder abduction 78   Shoulder adduction    Shoulder extension    Shoulder internal rotation 90%   Shoulder external rotation 50%   Elbow flexion 132   Elbow extension -17   Wrist flexion 19   Wrist extension 35   Wrist ulnar deviation 15   Wrist radial deviation 13   Wrist pronation WNL   Wrist supination 20 from neutral   (Blank rows = not tested)  UPPER EXTREMITY MMT:     MMT Right eval Left eval  Shoulder flexion    Shoulder abduction    Shoulder adduction    Shoulder extension    Shoulder internal rotation    Shoulder external rotation    Middle trapezius    Lower trapezius    Elbow flexion    Elbow extension    Wrist flexion    Wrist extension    Wrist ulnar deviation    Wrist radial deviation    Wrist pronation  Wrist supination    (Blank rows = not tested)    Active ROM Extension (as flexion to loose gross grasp) Right eval Left 05/17/2023  Thumb MCP (0-60)    Thumb IP (0-80)    Thumb Radial abd/add (0-55)    Thumb Palmar abd/add (0-45)    Thumb opposition to index    Index MCP (0-90) 0   Index PIP (0-100) -30   Index DIP (0-70) -40   Long MCP (0-90) -5   Long PIP (0-100) -45   Long DIP (0-70) -5   Ring MCP (0-90_ 0   Ring PIP (0-100) -40   Ring DIP (0-70) -20   Little MCP (0-90) -7   Little PIP (0-100) -25   Little DIP (0-70) -5   (Blank rows = not tested)    HAND FUNCTION: Grip strength: Right: 7,9 = 8# lbs; Left: 30 lbs  COORDINATION: Finger Nose Finger test: slow and deliberate with RUE, but able to touch both nose and finger at this time 9 Hole Peg test: Right: unable to pick up any pegs with RUE  Box and Blocks:  Right 16 blocks, Left 55  blocks  SENSATION: Light touch: WFL Hot/Cold: WFL  EDEMA: no swelling on eval  MUSCLE TONE: RUE: Mild and Hypertonic  COGNITION: Overall cognitive status: Within functional limits for tasks assessed  VISION: Subjective report: pt reports wearing glasses more since CVA Baseline vision: Wears glasses all the time, double vision after cataract removal Visual history: cataracts  OBSERVATIONS: Pt attempting grasp and pinch activities, frequently not incorporating index finger on R with increased flexion away from task.   TODAY'S TREATMENT:                                                           05/17/23 Resistance Clothespins: 1# with RUE for low to medium functional reaching and sustained pinch. Pt requiring increased time and effort to ensure proper placement of hand to incorporate index finger into pinch.  OT providing intermittent verbal cues to not hike R shoulder during reaching for vertical dowel.  Pt only able to achieve up to 90* flexion before unable to reach any higher.   Stacking blocks: engaged in stacking large foam blocks with focus on full grasp to incorporate index finger into grasp as well as sustained grasp when stacking and unstacking blocks.  OT providing mod verbal cues as pt with frequently omiting index finger.  Transitioned to stacking 1" blocks to isolate index finger incorporation into pinch. Saebo glide: engaged in shoulder flexion and horizontal abduction with use of saebo glide to further facilitate increased ROM.  OT providing tactile cues at trunk and shoulder as pt with tendency to hike shoulder and lean into movement. Box and blocks: 11 blocks in 1 min time limit   05/14/23 Wrist ROM: engaged in wrist flexion/extension with attempts to complete against resistance with velcro board, however pt with increased difficulty.  Therefore OT transitioned to attempting with medium sized weighted ball.  Pt then demonstrating improved ability to complete wrist flexion  and extension with use of ball for increased stretch.  Transitioned to supination/pronation with ball with OT providing verbal and intermittent tactile cues to decrease lean and shoulder hike during ROM.   Functional reach: engaged in reach for cones to low surface and then  place on elevated surface to facilitate increased ROM and hand opening for grasp/release.  Pt continues to demonstrate decreased active wrist extension as well as finger extension to fully open to grasp cone without assist from opposite hand to further open.    05/07/23 UE ROM: finger PIP flexion with OT providing demonstration and verbal cues for technique with positioning of LUE to facilitate increased blocking and stabilization during flexion.  Engaged in wrist extension on dowel to further facilitate increased extension, attempted with towel initially however pt requiring more rigid surface.   NMR: chest press and shoulder flexion ~90* with dowel with BUE to facilitate increased shoulder flexion, wrist extension, and symmetry.   Pt demonstrating decreased wrist extension, frequently compensating with R shoulder elevation and changing hand placement on dowel to compensate for decreased wrist extension.  OT bringing attention to hand placement and tactile cues at shoulder to facilitate increased symmetry. UE Functional use: engaged in picking up large foam blocks from lower surface to challenge functional reach with elbow extension and grasp/release with obtain and release blocks.  Pt demonstrating decreased index finger flexion and extension, requiring mod cues to attend to index finger during grasp.   PATIENT EDUCATION: Education details: ongoing condition specific education. Person educated: Patient and Spouse Education method: Explanation, Demonstration, and Verbal cues Education comprehension: verbalized understanding and needs further education  HOME EXERCISE PROGRAM: Access Code: [email or web address] URL:  [email or web address] Date: 04/23/2023 Prepared by: [facility] - [facility]    GOALS: Goals reviewed with patient? Yes  SHORT TERM GOALS: Target date: 05/18/23  Pt and spouse will be independent with HEP for ROM, strengthening, and coordination.  Baseline: Goal status: IN PROGRESS  2.  Pt will verbalize understanding of task modifications and/or potential AE needs to increase ease, safety, and independence w/ ADLs.  Baseline: Goal status: IN PROGRESS  LONG TERM GOALS: Target date: 06/15/23  Pt will demonstrate improved functional grasp in RUE by 6# to open new jar, increased handwriting, and self-feeding.  Baseline: R: 8#, L: 30# Goal status: IN PROGRESS  2.  Pt will demonstrate improved UE functional use for ADLs as evidenced by increasing box/ blocks score by 6 blocks with RUE. Baseline: R: 16 blocks, L: 55 blocks Goal status: IN PROGRESS  3.  Pt will demonstrate improved shoulder ROM (shoulder flexion and external rotation) as needed for ADLs/IADLs (washing hair, putting on jacket, etc).  Baseline:  Goal status: IN PROGRESS  4.  Pt will demonstrate improvement of finger extension by 15* TAM of index and long ringer finger to allow for more functional hand opening.  Baseline: -70 IF, -55 LF Goal status: IN PROGRESS  5.  Pt will demonstrate improved handwriting with ability to write name and simple grocery list with 90% legibility. Baseline:  Goal status: IN PROGRESS  6.  Pt will demonstrate improvements in functional use of dominant RUE as evidenced by improvements on FOTO. Baseline: TBD Goal status: IN PROGRESS  ASSESSMENT:  CLINICAL IMPRESSION: Pt continues to benefit from cues for positioning to decrease compensatory shoulder hike and trunk rotation and lean during UE ROM and attempts at functional grasp and release. Pt continues to demonstrate decreased finger flexion/extension, especially with index finger both during  structured exercises and attempts at functional tasks.  Pt continues to benefit from cues to incorporate index finger into structured tasks.    PERFORMANCE DEFICITS: in functional skills including ADLs, IADLs, coordination, dexterity, tone, ROM, strength, pain, flexibility, Fine motor control, Gross motor control,  mobility, balance, body mechanics, endurance, decreased knowledge of precautions, decreased knowledge of use of DME, and UE functional use and psychosocial skills including environmental adaptation and routines and behaviors.   IMPAIRMENTS: are limiting patient from ADLs and IADLs.   CO-MORBIDITIES: may have co-morbidities  that affects occupational performance. Patient will benefit from skilled OT to address above impairments and improve overall function.  MODIFICATION OR ASSISTANCE TO COMPLETE EVALUATION: Min-Moderate modification of tasks or assist with assess necessary to complete an evaluation.  OT OCCUPATIONAL PROFILE AND HISTORY: Detailed assessment: Review of records and additional review of physical, cognitive, psychosocial history related to current functional performance.  CLINICAL DECISION MAKING: Moderate - several treatment options, min-mod task modification necessary  REHAB POTENTIAL: Good  EVALUATION COMPLEXITY: Moderate    PLAN:  OT FREQUENCY: 2x/week  OT DURATION: 8 weeks  PLANNED INTERVENTIONS: self care/ADL training, therapeutic exercise, therapeutic activity, neuromuscular re-education, manual therapy, passive range of motion, functional mobility training, splinting, electrical stimulation, ultrasound, compression bandaging, moist heat, cryotherapy, patient/family education, psychosocial skills training, energy conservation, coping strategies training, and DME and/or AE instructions  RECOMMENDED OTHER SERVICES: NA  CONSULTED AND AGREED WITH PLAN OF CARE: Patient and family member/caregiver  PLAN FOR NEXT SESSION: complete UEFS, initiate gross grasp  tasks, further address wrist flexion/extension, supination, and finger flexion and extension     Avraj Lindroth, OTR/L 05/17/2023, 2:51 PM

## 2023-05-23 ENCOUNTER — Inpatient Hospital Stay: Payer: Medicare PPO | Admitting: Neurology

## 2023-05-23 ENCOUNTER — Ambulatory Visit: Payer: Medicare PPO

## 2023-05-23 DIAGNOSIS — R2689 Other abnormalities of gait and mobility: Secondary | ICD-10-CM | POA: Diagnosis not present

## 2023-05-23 DIAGNOSIS — I69351 Hemiplegia and hemiparesis following cerebral infarction affecting right dominant side: Secondary | ICD-10-CM | POA: Diagnosis not present

## 2023-05-23 DIAGNOSIS — M6281 Muscle weakness (generalized): Secondary | ICD-10-CM | POA: Diagnosis not present

## 2023-05-23 DIAGNOSIS — R2681 Unsteadiness on feet: Secondary | ICD-10-CM

## 2023-05-23 NOTE — Therapy (Signed)
OUTPATIENT PHYSICAL THERAPY TREATMENT NOTE   Patient Name: Angela Buchanan MRN: [id number] DOB:[birth year], 72 y.o., female Today's Date: 05/23/2023  PCP:  Garth Bigness, MD   REFERRING PROVIDER:  Garth Bigness, MD      END OF SESSION:   PT End of Session - 05/23/23 1236     Visit Number 38    Date for PT Re-Evaluation 07/20/23    Authorization Type [employer]    Authorization Time Period 03/19/2023 - 05/28/2023    Authorization - Visit Number 9    Authorization - Number of Visits 10    Progress Note Due on Visit 40    PT Start Time 1148    PT Stop Time 1232    PT Time Calculation (min) 44 min    Activity Tolerance Patient tolerated treatment well    Behavior During Therapy [facility] for tasks assessed/performed                     Past Medical History:  Diagnosis Date   Atypical chest pain 07/19/2021   Bigeminy    no current med.   Breast cancer (HCC) 06/2017   right   Bruises easily    CAD in native artery 10/31/2022   Carotid bruit 11/13/2019   Dental crowns present    History of diverticulitis    Hypertension    states under control with med., has been on med. x 5 yr.   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   Past Surgical History:  Procedure Laterality Date   ABDOMINAL HYSTERECTOMY     partial   BREAST LUMPECTOMY Right 06/22/2017   Malignant   BREAST LUMPECTOMY WITH RADIOACTIVE SEED AND SENTINEL LYMPH NODE BIOPSY Right 06/22/2017   Procedure: RIGHT BREAST LUMPECTOMY WITH RADIOACTIVE SEED AND RIGHT AXILLARY DEEP SENTINEL LYMPH NODE BIOPSY WITH BLUE DYE INJECTION;  Surgeon: Claud Kelp, MD;  Location: [facility];  Service: General;  Laterality: Right;   BREAST  LUMPECTOMY WITH RADIOACTIVE SEED LOCALIZATION Left 06/22/2017   Procedure: LEFT BREAST LUMPECTOMY WITH RADIOACTIVE SEED LOCALIZATION;  Surgeon: Claud Kelp, MD;  Location: [facility];  Service: General;  Laterality: Left;   CATARACT EXTRACTION W/ INTRAOCULAR LENS  IMPLANT, BILATERAL Bilateral    EXCISION OF BREAST BIOPSY Left 06/22/2017   benign   LOOP RECORDER INSERTION N/A 05/16/2022   Procedure: LOOP RECORDER INSERTION;  Surgeon: Lanier Prude, MD;  Location: [facility];  Service: Cardiovascular;  Laterality: N/A;   PERCUTANEOUS PINNING Right 06/22/2022   Procedure: PERCUTANEOUS PINNING OF RIGHT HIP;  Surgeon: Bjorn Pippin, MD;  Location: [facility];  Service: Orthopedics;  Laterality: Right;   TONSILLECTOMY     age 28   Patient Active Problem List   Diagnosis Date Noted   Right spastic hemiplegia (HCC) 12/14/2022   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   CAD in native artery 10/31/2022   Malnutrition of moderate degree 06/22/2022   Closed fracture of femur, neck (HCC) 06/21/2022   Urinary frequency 06/21/2022   Hypokalemia 06/21/2022   Heart murmur 06/21/2022   Hypothyroidism 06/21/2022   Dysphagia 05/17/2022   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   CVA (cerebral vascular accident) (HCC) 05/12/2022   Osteoporosis 01/24/2022   Atypical chest pain  07/19/2021   Temporomandibular joint (TMJ) pain 07/16/2020   Referred otalgia of both ears 07/16/2020   PAC (premature atrial contraction) 05/11/2020   PVC (premature ventricular contraction) 05/11/2020   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   Carotid bruit 11/13/2019   Pure hypercholesterolemia 11/13/2019   Malignant neoplasm of upper-outer quadrant of right breast in female, estrogen receptor positive (HCC) 05/22/2017   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   Diplopia 09/17/2014   Dizziness and giddiness 09/17/2014   Essential hypertension 07/22/2014   Chest pain 07/22/2014   Family history  of heart disease 07/22/2014    REFERRING DIAG:  R29.6 (ICD-10-CM) - Repeated falls, closed fracture of Rt hip    THERAPY DIAG:  Muscle weakness (generalized) - Plan: PT plan of care cert/re-cert  Unsteadiness on feet - Plan: PT plan of care cert/re-cert  Other abnormalities of gait and mobility - Plan: PT plan of care cert/re-cert  Rationale for Evaluation and Treatment Rehabilitation  PERTINENT HISTORY: hypertension, breast cancer, hyperlipidemia, Lt hip fracture secondary to fall 06/2022, CVA 06/2022   PRECAUTIONS:  Fall, Rt breast cancer with node removal, osteoporosis    SUBJECTIVE:                                                                                                                                                                                      SUBJECTIVE STATEMENT:  Therapy is really helping me.  My balance is improved and I feel much more steady.    Pt spouse present during visit.   PAIN:  PAIN:  Are you having pain? Yes NPRS scale: 0/10 Pain location: Right shoulder PAIN TYPE: aching Aggravating factors: use Relieving factors: Medication    OBJECTIVE: (objective measures completed at initial evaluation unless otherwise dated)  DIAGNOSTIC FINDINGS: Rt hip fracture and ORIF    COGNITION: Overall cognitive status: Within functional limits for tasks assessed                         SENSATION: WFL  POSTURE: rounded shoulders, forward head, flexed trunk , and weight shift left   PALPATION: NA   LOWER EXTREMITY ROM: Rt hip limited by 50%, hamstrings limited by 50% bil.    LOWER EXTREMITY MMT:   MMT Right eval Right  09/27/22 Left eval Right 10/30/22 Right  01/17/23 Right 05/23/23 Left 10/30/22 Left 01/17/23  Hip flexion 3+ 4- 4- 4- 4 4 4- 4+  Hip extension            Hip abduction 3-  4     4  Hip adduction            Hip internal  rotation            Hip external rotation            Knee flexion 3- 4- 3+  4 4  4+  Knee extension 3+ 4  4- 4 painful hip 4+ 4+ 4- 4+  Ankle dorsiflexion 3+ 4- 4- 4-   4   Ankle plantarflexion            Ankle inversion            Ankle eversion             (Blank rows = not tested)   FUNCTIONAL TESTS:   Eval: Timed up and go (TUG): 1 min, 27 seconds   09/27/22: 5x sit to stand: 29 seconds with use of Lt hand  TUG: 27 seconds   10/30/22: 5x sit to stand: 23 sec use of LT hand     TUG: 26 sec with standard cane   11/20/22: 5x sit to stand: 18.68 seconds    TUG: 19.45 without device    3 minute walk test: 225 feet with a standard cane   01/10/23: 5x sit to stand: 15.77 seconds   01/17/23: TUG with cane: 15.41 seconds with cane    3 min walk test: 313 feet with cane    03/07/23: 3 min walk 275 feet with single point cane   TUG: 16 seconds   03/14/23: 6 min walk test: 723 feet with standard cane    TUG: 14.16 without cane    5x sit to stand: 12.15 seconds without hand   03/28/23 TUG 11 seconds without cane  5x STS 10.6 seconds no Ues  04/18/2023: 6 min walk test:  532 ft with [facility]  05/09/23: 6 min walk test with SPC: 667 feet - RPE 5  05/23/23: TUG: 11 seconds without cane 5x sit to stand: 12.71 seconds  6 min walk test: 763 feet      TODAY'S TREATMENT:   DATE:  05/23/2023 Nustep level 3 x8 minutes with LE only. >55 spm PT present throughout to discuss status 6 min walk test, TUG and 5x sit to stand- see above Farmers carry 6# Lt UE from gym mat table to mirror 2 laps Alternating step tap on 6" step 2x10 with CGA Leg Press (seat at 5) 55# 2x10, single leg: Lt 35#, Rt 15# 2x10 bil each Hurdles: step to with CGA by PT on gait belt Sit to stand: 5# 2x10  DATE:  05/16/2023 Nustep level 3 x8 minutes with LE only. >55 spm PT present throughout to discuss status Farmers carry 6# Lt UE from gym mat table to mirror 2 laps Alternating step tap on 6" step 2x10 with CGA Leg Press (seat at 5) 55# 2x10, single leg: Lt 35#, Rt 15# 2x10 bil each Hurdles: step to with CGA by PT on gait  belt Sit to stand: 5# 2x10  DATE:  05/09/2023 6 min walk walk: 667 feet  Nustep level 3 x8 minutes with LE only. >55 spm PT present throughout to discuss status Farmers carry 6# Lt UE from gym mat table to mirror 2 laps Alternating step tap on 6" step 2x10 with CGA Leg Press (seat at 5) 55# 2x10, single leg: Lt 35#, Rt 15# 2x10 bil each Hurdles: step to with SBA and no hand held assistance  Standing at barre and reaching on wall to various letters and numbers - added cognitive task and had pt name animal that starts with letter she touched  Sit to stand: 6# 2x10  PATIENT EDUCATION:  Education details: Access Code: [email or web address] Person educated: Patient Education method: Explanation, Demonstration, and Handouts Education comprehension: verbalized understanding and returned demonstration   HOME EXERCISE PROGRAM: Access Code: [email or web address] URL: [email or web address] Date: 09/27/2022 Prepared by: Tresa Endo  Exercises - Seated Long Arc Quad  - 3 x daily - 7 x weekly - 2 sets - 10 reps - 5 hold - Seated March   - 3 x daily - 7 x weekly - 3 sets - 10 reps - Seated Heel Toe Raises   - 3 x daily - 7 x weekly - 2 sets - 10 reps - Seated Isometric Hip Adduction with Ball  - 3 x daily - 7 x weekly - 2 sets - 10 reps - Sit to Stand with Armchair  - 2 x daily - 7 x weekly - 2 sets - 5-10 reps - Standing Hip Abduction with Counter Support  - 1 x daily - 7 x weekly - 2 sets - 10 reps - Heel Raises with Counter Support  - 2 x daily - 7 x weekly - 2 sets - 10 reps  Patient Education - Walking with a Single [employer] - Modified 4 Point Gait Pattern  ASSESSMENT:   CLINICAL IMPRESSION:      Pt is making steady progress and demonstrates improved independence and stability overall.  Functional measures are all improved today and pt requires less guarding from PT with all exercise in the clinic.  Pt with mobility deficits due to CVA followed by hip fracture requiring ORIF which has made progress slow.     Patient will benefit from skilled PT to address the below impairments and improve overall function.   OBJECTIVE IMPAIRMENTS: Abnormal gait, decreased activity tolerance, decreased balance, decreased mobility, difficulty walking, decreased strength, decreased safety awareness, impaired perceived functional ability, impaired flexibility, postural dysfunction, and pain.    ACTIVITY LIMITATIONS: carrying, lifting, sitting, standing, stairs, transfers, hygiene/grooming, and locomotion level   PARTICIPATION LIMITATIONS: meal prep, cleaning, laundry, driving, shopping, and community activity   PERSONAL FACTORS: Age, Past/current experiences, and 1-2 comorbidities: CVA, falls, Rt hip fracture with ORIF  are also affecting patient's functional outcome.    REHAB POTENTIAL: Good   CLINICAL DECISION MAKING: Evolving/moderate complexity   EVALUATION COMPLEXITY: Moderate     GOALS: Goals reviewed with patient? Yes   SHORT TERM GOALS: Target date: 09/06/2022   Be independent in initial HEP Baseline: Goal status: Goal met 08/14/22   2.  Improve LE strength to perform sit to stand with moderate Lt UE support Baseline:  Goal status: Goal met 08/28/22  3.  Perform TUG in < or = to 60 seconds to reduce falls risk Baseline: 27 seconds (09/27/22) Goal status: MET      LONG TERM GOALS: Target date:  07/20/23   Be independent in advanced HEP Baseline: independent in current HEP and has tolerated progress when appropriate (05/23/23)  Goal status: in progress    2.  Perform TUG in < or = to 13 seconds to reduce falls risk Baseline: 11 seconds without cane (05/23/23) Goal status: met   3.  ambulate > or = to 875 feet in 6 minutes to improve community ambulation and independence  Baseline: 763 (05/23/23) Goal status: In progress      4.  perform 5x sit to stand in < or = to 12 seconds to reduce falls risk  Baseline: 12.71 seconds (05/23/23)  Goal Status: In progress   8. Improve balance  to  walk her dog short distances with independence   Baseline: walking driveway with dog (05/23/23)  Goal status: in progress     PLAN:   PT FREQUENCY: 1x/week   PT DURATION: 10 weeks   PLANNED INTERVENTIONS: Therapeutic exercises, Therapeutic activity, Neuromuscular re-education, Balance training, Gait training, Patient/Family education, Self Care, Joint mobilization, Stair training, Dry Needling, Electrical stimulation, Cryotherapy, Moist heat, Taping, Manual therapy, and Re-evaluation   PLAN FOR NEXT SESSION: Continue to work on gait , strength and balance. Endurance, change of direction.   Step taps to pods in circle   Lorrene Reid, PT 05/23/23 12:39 PM    [facility] [address] [place], [place] [address] Phone # [phone] Fax [phone]

## 2023-05-24 ENCOUNTER — Ambulatory Visit: Payer: Medicare PPO | Admitting: Occupational Therapy

## 2023-05-24 DIAGNOSIS — M6281 Muscle weakness (generalized): Secondary | ICD-10-CM | POA: Diagnosis not present

## 2023-05-24 DIAGNOSIS — M25611 Stiffness of right shoulder, not elsewhere classified: Secondary | ICD-10-CM | POA: Diagnosis not present

## 2023-05-24 DIAGNOSIS — I69351 Hemiplegia and hemiparesis following cerebral infarction affecting right dominant side: Secondary | ICD-10-CM

## 2023-05-24 DIAGNOSIS — R278 Other lack of coordination: Secondary | ICD-10-CM | POA: Diagnosis not present

## 2023-05-24 DIAGNOSIS — R2681 Unsteadiness on feet: Secondary | ICD-10-CM | POA: Diagnosis not present

## 2023-05-24 NOTE — Therapy (Signed)
OUTPATIENT OCCUPATIONAL THERAPY NEURO  Treatment Note  Patient Name: Angela Buchanan MRN: [id number] DOB:[birth year], 72 y.o., female Today's Date: 05/24/2023  PCP: Shon Hale, MD REFERRING PROVIDER: Shon Hale, MD  END OF SESSION:  OT End of Session - 05/24/23 1411     Visit Number 8    Number of Visits 17    Date for OT Re-Evaluation 06/15/23    Authorization Type [employer]    OT Start Time 1409    OT Stop Time 1449    OT Time Calculation (min) 40 min    Behavior During Therapy [facility] for tasks assessed/performed                    Past Medical History:  Diagnosis Date   Atypical chest pain 07/19/2021   Bigeminy    no current med.   Breast cancer (HCC) 06/2017   right   Bruises easily    CAD in native artery 10/31/2022   Carotid bruit 11/13/2019   Dental crowns present    History of diverticulitis    Hypertension    states under control with med., has been on med. x 5 yr.   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   Past Surgical History:  Procedure Laterality Date   ABDOMINAL HYSTERECTOMY     partial   BREAST LUMPECTOMY Right 06/22/2017   Malignant   BREAST LUMPECTOMY WITH RADIOACTIVE SEED AND SENTINEL LYMPH NODE BIOPSY Right 06/22/2017   Procedure: RIGHT BREAST LUMPECTOMY WITH RADIOACTIVE SEED AND RIGHT AXILLARY DEEP SENTINEL LYMPH NODE BIOPSY WITH BLUE DYE INJECTION;  Surgeon: Claud Kelp, MD;  Location: [facility];  Service: General;  Laterality: Right;   BREAST LUMPECTOMY WITH RADIOACTIVE SEED LOCALIZATION Left 06/22/2017   Procedure: LEFT BREAST LUMPECTOMY WITH RADIOACTIVE SEED LOCALIZATION;  Surgeon: Claud Kelp, MD;  Location: [facility];   Service: General;  Laterality: Left;   CATARACT EXTRACTION W/ INTRAOCULAR LENS  IMPLANT, BILATERAL Bilateral    EXCISION OF BREAST BIOPSY Left 06/22/2017   benign   LOOP RECORDER INSERTION N/A 05/16/2022   Procedure: LOOP RECORDER INSERTION;  Surgeon: Lanier Prude, MD;  Location: [facility];  Service: Cardiovascular;  Laterality: N/A;   PERCUTANEOUS PINNING Right 06/22/2022   Procedure: PERCUTANEOUS PINNING OF RIGHT HIP;  Surgeon: Bjorn Pippin, MD;  Location: [facility];  Service: Orthopedics;  Laterality: Right;   TONSILLECTOMY     age 64   Patient Active Problem List   Diagnosis Date Noted   Right spastic hemiplegia (HCC) 12/14/2022   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 05/11/2020   PVC (premature ventricular contraction) 05/11/2020   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   Carotid bruit 11/13/2019   Pure hypercholesterolemia  11/13/2019   Malignant neoplasm of upper-outer quadrant of right breast in female, estrogen receptor positive (HCC) 05/22/2017   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   Diplopia 09/17/2014   Dizziness and giddiness 09/17/2014   Essential hypertension 07/22/2014   Chest pain 07/22/2014   Family history of heart disease 07/22/2014    ONSET DATE: 05/13/22 (new referral 04/09/23)  REFERRING DIAG: G83.23 (ICD-10-CM) - 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 Pt accompanied by: self and significant other ([name])  PERTINENT HISTORY: hypertension, breast cancer, hyperlipidemia, Lt hip fracture secondary to fall 06/2022, CVA 05/2022    PRECAUTIONS: Fall, Rt breast cancer with node removal, osteoporosis   WEIGHT BEARING RESTRICTIONS: No  PAIN:  Are you having pain? No  FALLS: Has patient fallen in last 6 months? No  LIVING ENVIRONMENT: Lives with: lives with their spouse Lives in: House/apartment Stairs: Yes: External: 2 steps; on left going up Has following equipment at home: Quad cane large base, Environmental consultant - 2 wheeled, Hemi walker, Wheelchair (manual), shower chair, and Grab bars  PLOF: Independent and Independent with basic ADLs  PATIENT GOALS: to have better use my arm and hand  OBJECTIVE:   HAND DOMINANCE: Right  ADLs:  Transfers/ambulation related to ADLs: Utilizes [facility] for mobility in the community, 80% in the house but will walk without occasionally Eating: husband cuts foods, utilizing built up handles for self-feeding Grooming: needs assistance with washing hair UB Dressing: needs assistance with fastening bra LB Dressing: Mod-max assist for donning pants due to fear of falling, able to don socks and slip on shoes - unable to tie shoes Toileting: Mod I Bathing: Supervision Tub Shower transfers: Supervision, but able to complete transfers with use of grab bars Equipment: Shower seat with back, Grab bars, and Walk in shower  IADLs: Handwriting:  TBA at next session  MOBILITY STATUS: Needs Assist: utilized [facility] for mobility and Hx of  falls  POSTURE COMMENTS:  rounded shoulders and forward head  ACTIVITY TOLERANCE: Activity tolerance: fatigues with increased mobility and effort  FUNCTIONAL OUTCOME MEASURES: Upper Extremity Functional Scale (UEFS): TBD  UPPER EXTREMITY ROM:    Active ROM Right eval Left eval  Shoulder flexion 87   Shoulder abduction 78   Shoulder adduction    Shoulder extension    Shoulder internal rotation 90%   Shoulder external rotation 50%   Elbow flexion 132   Elbow extension -17   Wrist flexion 19   Wrist extension 35   Wrist ulnar deviation 15   Wrist radial deviation 13   Wrist pronation WNL   Wrist supination 20 from neutral   (Blank rows = not tested)  UPPER EXTREMITY MMT:     MMT Right eval Left eval  Shoulder flexion    Shoulder abduction    Shoulder adduction    Shoulder extension    Shoulder internal rotation    Shoulder external rotation    Middle trapezius    Lower trapezius    Elbow flexion    Elbow extension    Wrist flexion    Wrist extension    Wrist ulnar deviation  Wrist radial deviation    Wrist pronation    Wrist supination    (Blank rows = not tested)    Active ROM Extension (as flexion to loose gross grasp) Right eval Left 05/24/2023  Thumb MCP (0-60)    Thumb IP (0-80)    Thumb Radial abd/add (0-55)    Thumb Palmar abd/add (0-45)    Thumb opposition to index    Index MCP (0-90) 0   Index PIP (0-100) -30   Index DIP (0-70) -40   Long MCP (0-90) -5   Long PIP (0-100) -45   Long DIP (0-70) -5   Ring MCP (0-90_ 0   Ring PIP (0-100) -40   Ring DIP (0-70) -20   Little MCP (0-90) -7   Little PIP (0-100) -25   Little DIP (0-70) -5   (Blank rows = not tested)    HAND FUNCTION: Grip strength: Right: 7,9 = 8# lbs; Left: 30 lbs  COORDINATION: Finger Nose Finger test: slow and deliberate with RUE, but able to touch both nose and finger at this time 9 Hole Peg test: Right: unable to pick up any pegs with RUE  Box and Blocks:  Right  16 blocks, Left 55 blocks  SENSATION: Light touch: WFL Hot/Cold: WFL  EDEMA: no swelling on eval  MUSCLE TONE: RUE: Mild and Hypertonic  COGNITION: Overall cognitive status: Within functional limits for tasks assessed  VISION: Subjective report: pt reports wearing glasses more since CVA Baseline vision: Wears glasses all the time, double vision after cataract removal Visual history: cataracts  OBSERVATIONS: Pt attempting grasp and pinch activities, frequently not incorporating index finger on R with increased flexion away from task.   TODAY'S TREATMENT:                                                           05/24/23 Engaged in functional reach with RUE with focus on shoulder flexion, internal/external rotation, and wrist flexion/extension and radial deviation.  OT holding cones in low and mid range to facilitate elbow flexion/extension and shoulder flexion.  Pt able to reach to ~80* without onset of shoulder hike, however any higher demonstrating hike and L lean.  Pt demonstrating increased wrist flexion and extension when grasping cone, however still with difficulty releasing cone from grasp.  Transitioned to picking up large grip pegs with focus on incorporating index finger into pinch and again release of grasp.  Engaged in supination with picking up card with gross grasp and flipping forearm and palm to release.  OT encouraged pt to exaggerate opening and release of object, however primary focus on forearm supination. Kinesiotape: applied kinesiotape to R hand and digits in retrograde fashion to facilitate increased passive extension while still allowing pt to engage in grasp and release exercises.   05/17/23 Resistance Clothespins: 1# with RUE for low to medium functional reaching and sustained pinch. Pt requiring increased time and effort to ensure proper placement of hand to incorporate index finger into pinch.  OT providing intermittent verbal cues to not hike R shoulder during  reaching for vertical dowel.  Pt only able to achieve up to 90* flexion before unable to reach any higher.   Stacking blocks: engaged in stacking large foam blocks with focus on full grasp to incorporate index finger into grasp as well as  sustained grasp when stacking and unstacking blocks.  OT providing mod verbal cues as pt with frequently omiting index finger.  Transitioned to stacking 1" blocks to isolate index finger incorporation into pinch. Saebo glide: engaged in shoulder flexion and horizontal abduction with use of saebo glide to further facilitate increased ROM.  OT providing tactile cues at trunk and shoulder as pt with tendency to hike shoulder and lean into movement. Box and blocks: 11 blocks in 1 min time limit   05/14/23 Wrist ROM: engaged in wrist flexion/extension with attempts to complete against resistance with velcro board, however pt with increased difficulty.  Therefore OT transitioned to attempting with medium sized weighted ball.  Pt then demonstrating improved ability to complete wrist flexion and extension with use of ball for increased stretch.  Transitioned to supination/pronation with ball with OT providing verbal and intermittent tactile cues to decrease lean and shoulder hike during ROM.   Functional reach: engaged in reach for cones to low surface and then place on elevated surface to facilitate increased ROM and hand opening for grasp/release.  Pt continues to demonstrate decreased active wrist extension as well as finger extension to fully open to grasp cone without assist from opposite hand to further open.   PATIENT EDUCATION: Education details: ongoing condition specific education. Person educated: Patient and Spouse Education method: Explanation, Demonstration, and Verbal cues Education comprehension: verbalized understanding and needs further education  HOME EXERCISE PROGRAM: Access Code: [email or web address] URL: [email or web address] Date:  04/23/2023 Prepared by: [facility]    GOALS: Goals reviewed with patient? Yes  SHORT TERM GOALS: Target date: 05/18/23  Pt and spouse will be independent with HEP for ROM, strengthening, and coordination.  Baseline: Goal status: IN PROGRESS  2.  Pt will verbalize understanding of task modifications and/or potential AE needs to increase ease, safety, and independence w/ ADLs.  Baseline: Goal status: IN PROGRESS  LONG TERM GOALS: Target date: 06/15/23  Pt will demonstrate improved functional grasp in RUE by 6# to open new jar, increased handwriting, and self-feeding.  Baseline: R: 8#, L: 30# Goal status: IN PROGRESS  2.  Pt will demonstrate improved UE functional use for ADLs as evidenced by increasing box/ blocks score by 6 blocks with RUE. Baseline: R: 16 blocks, L: 55 blocks Goal status: IN PROGRESS  3.  Pt will demonstrate improved shoulder ROM (shoulder flexion and external rotation) as needed for ADLs/IADLs (washing hair, putting on jacket, etc).  Baseline:  Goal status: IN PROGRESS  4.  Pt will demonstrate improvement of finger extension by 15* TAM of index and long ringer finger to allow for more functional hand opening.  Baseline: -70 IF, -55 LF Goal status: IN PROGRESS  5.  Pt will demonstrate improved handwriting with ability to write name and simple grocery list with 90% legibility. Baseline:  Goal status: IN PROGRESS  6.  Pt will demonstrate improvements in functional use of dominant RUE as evidenced by improvements on FOTO. Baseline: TBD Goal status: IN PROGRESS  ASSESSMENT:  CLINICAL IMPRESSION: Pt continues to benefit from cues for positioning to decrease compensatory shoulder hike and trunk rotation and lean during UE ROM and attempts at functional grasp and release. Pt demonstrating increased rotation at hips to allow for more functional reach with decreased compensatory strategies.  Pt continues to demonstrate  decreased finger flexion/extension, appreciative of attempts with kinesiotape to facilitate finger extension.  PERFORMANCE DEFICITS: in functional skills including ADLs, IADLs, coordination, dexterity, tone, ROM, strength, pain,  flexibility, Fine motor control, Gross motor control, mobility, balance, body mechanics, endurance, decreased knowledge of precautions, decreased knowledge of use of DME, and UE functional use and psychosocial skills including environmental adaptation and routines and behaviors.   IMPAIRMENTS: are limiting patient from ADLs and IADLs.   CO-MORBIDITIES: may have co-morbidities  that affects occupational performance. Patient will benefit from skilled OT to address above impairments and improve overall function.  MODIFICATION OR ASSISTANCE TO COMPLETE EVALUATION: Min-Moderate modification of tasks or assist with assess necessary to complete an evaluation.  OT OCCUPATIONAL PROFILE AND HISTORY: Detailed assessment: Review of records and additional review of physical, cognitive, psychosocial history related to current functional performance.  CLINICAL DECISION MAKING: Moderate - several treatment options, min-mod task modification necessary  REHAB POTENTIAL: Good  EVALUATION COMPLEXITY: Moderate    PLAN:  OT FREQUENCY: 2x/week  OT DURATION: 8 weeks  PLANNED INTERVENTIONS: self care/ADL training, therapeutic exercise, therapeutic activity, neuromuscular re-education, manual therapy, passive range of motion, functional mobility training, splinting, electrical stimulation, ultrasound, compression bandaging, moist heat, cryotherapy, patient/family education, psychosocial skills training, energy conservation, coping strategies training, and DME and/or AE instructions  RECOMMENDED OTHER SERVICES: NA  CONSULTED AND AGREED WITH PLAN OF CARE: Patient and family member/caregiver  PLAN FOR NEXT SESSION: complete UEFS, initiate gross grasp tasks, further address wrist  flexion/extension, supination, and finger flexion and extension     Arlisa Leclere, OTR/L 05/24/2023, 2:12 PM

## 2023-05-29 ENCOUNTER — Ambulatory Visit: Payer: Medicare PPO | Admitting: Occupational Therapy

## 2023-05-29 DIAGNOSIS — M6281 Muscle weakness (generalized): Secondary | ICD-10-CM | POA: Diagnosis not present

## 2023-05-29 DIAGNOSIS — R2681 Unsteadiness on feet: Secondary | ICD-10-CM | POA: Diagnosis not present

## 2023-05-29 DIAGNOSIS — I69351 Hemiplegia and hemiparesis following cerebral infarction affecting right dominant side: Secondary | ICD-10-CM

## 2023-05-29 DIAGNOSIS — M25611 Stiffness of right shoulder, not elsewhere classified: Secondary | ICD-10-CM

## 2023-05-29 DIAGNOSIS — R278 Other lack of coordination: Secondary | ICD-10-CM | POA: Diagnosis not present

## 2023-05-29 NOTE — Therapy (Signed)
OUTPATIENT OCCUPATIONAL THERAPY NEURO  Treatment Note  Patient Name: Angela Buchanan MRN: [id number] DOB:[birth year], 72 y.o., female Today's Date: 05/29/2023  PCP: Shon Hale, MD REFERRING PROVIDER: Shon Hale, MD  END OF SESSION:  OT End of Session - 05/29/23 1632     Visit Number 9    Number of Visits 17    Date for OT Re-Evaluation 06/15/23    Authorization Type [employer]    OT Start Time 1405    OT Stop Time 1445    OT Time Calculation (min) 40 min    Behavior During Therapy [facility] for tasks assessed/performed                     Past Medical History:  Diagnosis Date   Atypical chest pain 07/19/2021   Bigeminy    no current med.   Breast cancer (HCC) 06/2017   right   Bruises easily    CAD in native artery 10/31/2022   Carotid bruit 11/13/2019   Dental crowns present    History of diverticulitis    Hypertension    states under control with med., has been on med. x 5 yr.   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   Past Surgical History:  Procedure Laterality Date   ABDOMINAL HYSTERECTOMY     partial   BREAST LUMPECTOMY Right 06/22/2017   Malignant   BREAST LUMPECTOMY WITH RADIOACTIVE SEED AND SENTINEL LYMPH NODE BIOPSY Right 06/22/2017   Procedure: RIGHT BREAST LUMPECTOMY WITH RADIOACTIVE SEED AND RIGHT AXILLARY DEEP SENTINEL LYMPH NODE BIOPSY WITH BLUE DYE INJECTION;  Surgeon: Claud Kelp, MD;  Location: [facility];  Service: General;  Laterality: Right;   BREAST LUMPECTOMY WITH RADIOACTIVE SEED LOCALIZATION Left 06/22/2017   Procedure: LEFT BREAST LUMPECTOMY WITH RADIOACTIVE SEED LOCALIZATION;  Surgeon: Claud Kelp, MD;  Location: [facility];  Service: General;  Laterality: Left;   CATARACT EXTRACTION W/ INTRAOCULAR LENS  IMPLANT, BILATERAL Bilateral    EXCISION OF BREAST BIOPSY Left 06/22/2017   benign   LOOP RECORDER INSERTION N/A 05/16/2022   Procedure: LOOP RECORDER INSERTION;  Surgeon: Lanier Prude, MD;  Location: [facility];  Service: Cardiovascular;  Laterality: N/A;   PERCUTANEOUS PINNING Right 06/22/2022   Procedure: PERCUTANEOUS PINNING OF RIGHT HIP;  Surgeon: Bjorn Pippin, MD;  Location: [facility];  Service: Orthopedics;  Laterality: Right;   TONSILLECTOMY     age 34   Patient Active Problem List   Diagnosis Date Noted   Right spastic hemiplegia (HCC) 12/14/2022   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 05/11/2020   PVC (premature ventricular contraction) 05/11/2020   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   Carotid bruit 11/13/2019   Pure  hypercholesterolemia 11/13/2019   Malignant neoplasm of upper-outer quadrant of right breast in female, estrogen receptor positive (HCC) 05/22/2017   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   Diplopia 09/17/2014   Dizziness and giddiness 09/17/2014   Essential hypertension 07/22/2014   Chest pain 07/22/2014   Family history of heart disease 07/22/2014    ONSET DATE: 05/13/22 (new referral 04/09/23)  REFERRING DIAG: G83.23 (ICD-10-CM) - 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  Pt accompanied by: self and significant other ([name])  PERTINENT HISTORY: hypertension, breast cancer, hyperlipidemia, Lt hip fracture secondary to fall 06/2022, CVA 05/2022    PRECAUTIONS: Fall, Rt breast cancer with node removal, osteoporosis   WEIGHT BEARING RESTRICTIONS: No  PAIN:  Are you having pain? No  FALLS: Has patient fallen in last 6 months? No  LIVING ENVIRONMENT: Lives with: lives with their spouse Lives in: House/apartment Stairs: Yes: External: 2 steps; on left going up Has following equipment at home: Quad cane large base, Environmental consultant - 2 wheeled, Hemi walker, Wheelchair (manual), shower chair, and Grab bars  PLOF: Independent and Independent with basic ADLs  PATIENT GOALS: to have better use my arm and hand  OBJECTIVE:   HAND DOMINANCE: Right  ADLs:  Transfers/ambulation related to ADLs: Utilizes [facility] for mobility in the community, 80% in the house but will walk without occasionally Eating: husband cuts foods, utilizing built up handles for self-feeding Grooming: needs assistance with washing hair UB Dressing: needs assistance with fastening bra LB Dressing: Mod-max assist for donning pants due to fear of falling, able to don socks and slip on shoes - unable to tie shoes Toileting: Mod I Bathing: Supervision Tub Shower transfers: Supervision, but able to complete transfers with use of grab bars Equipment: Shower seat with back, Grab bars, and Walk in shower  IADLs: Handwriting:  TBA at next session  MOBILITY STATUS: Needs Assist:  utilized [facility] for mobility and Hx of falls  POSTURE COMMENTS:  rounded shoulders and forward head  ACTIVITY TOLERANCE: Activity tolerance: fatigues with increased mobility and effort  FUNCTIONAL OUTCOME MEASURES: Upper Extremity Functional Scale (UEFS): TBD  UPPER EXTREMITY ROM:    Active ROM Right eval Left eval  Shoulder flexion 87   Shoulder abduction 78   Shoulder adduction    Shoulder extension    Shoulder internal rotation 90%   Shoulder external rotation 50%   Elbow flexion 132   Elbow extension -17   Wrist flexion 19   Wrist extension 35   Wrist ulnar deviation 15   Wrist radial deviation 13   Wrist pronation WNL   Wrist supination 20 from neutral   (Blank rows = not tested)  UPPER EXTREMITY MMT:     MMT Right eval Left eval  Shoulder flexion    Shoulder abduction    Shoulder adduction    Shoulder extension    Shoulder internal rotation    Shoulder external rotation    Middle trapezius    Lower trapezius    Elbow flexion    Elbow extension    Wrist flexion    Wrist extension    Wrist  ulnar deviation    Wrist radial deviation    Wrist pronation    Wrist supination    (Blank rows = not tested)    Active ROM Extension (as flexion to loose gross grasp) Right eval Left 05/29/2023  Thumb MCP (0-60)    Thumb IP (0-80)    Thumb Radial abd/add (0-55)    Thumb Palmar abd/add (0-45)    Thumb opposition to index    Index MCP (0-90) 0   Index PIP (0-100) -30   Index DIP (0-70) -40   Long MCP (0-90) -5   Long PIP (0-100) -45   Long DIP (0-70) -5   Ring MCP (0-90_ 0   Ring PIP (0-100) -40   Ring DIP (0-70) -20   Little MCP (0-90) -7   Little PIP (0-100) -25   Little DIP (0-70) -5   (Blank rows = not tested)    HAND FUNCTION: Grip strength: Right: 7,9 = 8# lbs; Left: 30 lbs  COORDINATION: Finger Nose Finger test: slow and deliberate with RUE, but able to touch both nose and finger at this time 9 Hole Peg test: Right: unable to pick up any  pegs with RUE  Box and Blocks:  Right 16 blocks, Left 55 blocks  SENSATION: Light touch: WFL Hot/Cold: WFL  EDEMA: no swelling on eval  MUSCLE TONE: RUE: Mild and Hypertonic  COGNITION: Overall cognitive status: Within functional limits for tasks assessed  VISION: Subjective report: pt reports wearing glasses more since CVA Baseline vision: Wears glasses all the time, double vision after cataract removal Visual history: cataracts  OBSERVATIONS: Pt attempting grasp and pinch activities, frequently not incorporating index finger on R with increased flexion away from task.   TODAY'S TREATMENT:                                                           05/29/23 Forced use: engaged in stacking/unstacking blocks with focus on incorporation of R index finger and shoulder flexion with increased heigh of block stack.  Pt able to stack up to 5 blocks with use of index finger and thumb with focus on flexion and extension.  Pt requiring increased time to obtain grasp and release, knocking over 20% of time.  Transitioned to placing rings onto vertical dowel to further facilitate sustained grasp with shoulder flexion.  Pt continues to be limited in shoulder flexion with functional reach, ~80-90* shoulder flexion. UE ROM: OT instructed in wrist flexion/extension as well as ulnar/radial deviation.  OT completing PROM in ulnar and radial deviation with noted increased tone in both directions.  OT educated pt on importance of adding ulnar and radial deviation to wrist flexion/extension exercises and carryover of movement into functional tasks (turning on water faucet). Kinesiotape: removed previous kinesiotape due to pealing and applied kinesiotape to R hand and digits in retrograde fashion to facilitate increased passive extension while still allowing pt to engage in grasp and release exercises.   05/24/23 Engaged in functional reach with RUE with focus on shoulder flexion, internal/external rotation, and  wrist flexion/extension and radial deviation.  OT holding cones in low and mid range to facilitate elbow flexion/extension and shoulder flexion.  Pt able to reach to ~80* without onset of shoulder hike, however any higher demonstrating hike and L lean.  Pt demonstrating increased wrist  flexion and extension when grasping cone, however still with difficulty releasing cone from grasp.  Transitioned to picking up large grip pegs with focus on incorporating index finger into pinch and again release of grasp.  Engaged in supination with picking up card with gross grasp and flipping forearm and palm to release.  OT encouraged pt to exaggerate opening and release of object, however primary focus on forearm supination. Kinesiotape: applied kinesiotape to R hand and digits in retrograde fashion to facilitate increased passive extension while still allowing pt to engage in grasp and release exercises.   05/17/23 Resistance Clothespins: 1# with RUE for low to medium functional reaching and sustained pinch. Pt requiring increased time and effort to ensure proper placement of hand to incorporate index finger into pinch.  OT providing intermittent verbal cues to not hike R shoulder during reaching for vertical dowel.  Pt only able to achieve up to 90* flexion before unable to reach any higher.   Stacking blocks: engaged in stacking large foam blocks with focus on full grasp to incorporate index finger into grasp as well as sustained grasp when stacking and unstacking blocks.  OT providing mod verbal cues as pt with frequently omiting index finger.  Transitioned to stacking 1" blocks to isolate index finger incorporation into pinch. Saebo glide: engaged in shoulder flexion and horizontal abduction with use of saebo glide to further facilitate increased ROM.  OT providing tactile cues at trunk and shoulder as pt with tendency to hike shoulder and lean into movement. Box and blocks: 11 blocks in 1 min time limit  PATIENT  EDUCATION: Education details: ongoing condition specific education. Person educated: Patient and Spouse Education method: Explanation, Demonstration, and Verbal cues Education comprehension: verbalized understanding and needs further education  HOME EXERCISE PROGRAM: Access Code: [email or web address] URL: [email or web address] Date: 04/23/2023 Prepared by: [facility]    GOALS: Goals reviewed with patient? Yes  SHORT TERM GOALS: Target date: 05/18/23  Pt and spouse will be independent with HEP for ROM, strengthening, and coordination.  Baseline: Goal status: IN PROGRESS  2.  Pt will verbalize understanding of task modifications and/or potential AE needs to increase ease, safety, and independence w/ ADLs.  Baseline: Goal status: IN PROGRESS  LONG TERM GOALS: Target date: 06/15/23  Pt will demonstrate improved functional grasp in RUE by 6# to open new jar, increased handwriting, and self-feeding.  Baseline: R: 8#, L: 30# Goal status: IN PROGRESS  2.  Pt will demonstrate improved UE functional use for ADLs as evidenced by increasing box/ blocks score by 6 blocks with RUE. Baseline: R: 16 blocks, L: 55 blocks Goal status: IN PROGRESS  3.  Pt will demonstrate improved shoulder ROM (shoulder flexion and external rotation) as needed for ADLs/IADLs (washing hair, putting on jacket, etc).  Baseline:  Goal status: IN PROGRESS  4.  Pt will demonstrate improvement of finger extension by 15* TAM of index and long ringer finger to allow for more functional hand opening.  Baseline: -70 IF, -55 LF Goal status: IN PROGRESS  5.  Pt will demonstrate improved handwriting with ability to write name and simple grocery list with 90% legibility. Baseline:  Goal status: IN PROGRESS  6.  Pt will demonstrate improvements in functional use of dominant RUE as evidenced by improvements on FOTO. Baseline: TBD Goal status: IN PROGRESS  ASSESSMENT:  CLINICAL  IMPRESSION: Pt continues to benefit from cues for positioning to decrease compensatory shoulder hike and trunk rotation and lean during  UE ROM and attempts at functional grasp and release. Pt demonstrating increased active index finger flexion/extension with massed practice continues to be limited by shoulder flexion with functional reach and functional tasks.  Pt appreciative of attempts with kinesiotape to facilitate finger extension, reporting hand feeling "almost normal".    PERFORMANCE DEFICITS: in functional skills including ADLs, IADLs, coordination, dexterity, tone, ROM, strength, pain, flexibility, Fine motor control, Gross motor control, mobility, balance, body mechanics, endurance, decreased knowledge of precautions, decreased knowledge of use of DME, and UE functional use and psychosocial skills including environmental adaptation and routines and behaviors.   IMPAIRMENTS: are limiting patient from ADLs and IADLs.   CO-MORBIDITIES: may have co-morbidities  that affects occupational performance. Patient will benefit from skilled OT to address above impairments and improve overall function.  MODIFICATION OR ASSISTANCE TO COMPLETE EVALUATION: Min-Moderate modification of tasks or assist with assess necessary to complete an evaluation.  OT OCCUPATIONAL PROFILE AND HISTORY: Detailed assessment: Review of records and additional review of physical, cognitive, psychosocial history related to current functional performance.  CLINICAL DECISION MAKING: Moderate - several treatment options, min-mod task modification necessary  REHAB POTENTIAL: Good  EVALUATION COMPLEXITY: Moderate    PLAN:  OT FREQUENCY: 2x/week  OT DURATION: 8 weeks  PLANNED INTERVENTIONS: self care/ADL training, therapeutic exercise, therapeutic activity, neuromuscular re-education, manual therapy, passive range of motion, functional mobility training, splinting, electrical stimulation, ultrasound, compression bandaging,  moist heat, cryotherapy, patient/family education, psychosocial skills training, energy conservation, coping strategies training, and DME and/or AE instructions  RECOMMENDED OTHER SERVICES: NA  CONSULTED AND AGREED WITH PLAN OF CARE: Patient and family member/caregiver  PLAN FOR NEXT SESSION: initiate gross grasp tasks, further address wrist flexion/extension and ulnar/radial deviation, supination, and finger flexion and extension     Glady Ouderkirk, OTR/L 05/29/2023, 4:32 PM

## 2023-05-30 ENCOUNTER — Ambulatory Visit: Payer: Medicare PPO

## 2023-05-30 DIAGNOSIS — M6281 Muscle weakness (generalized): Secondary | ICD-10-CM

## 2023-05-30 DIAGNOSIS — R2689 Other abnormalities of gait and mobility: Secondary | ICD-10-CM | POA: Diagnosis not present

## 2023-05-30 DIAGNOSIS — R2681 Unsteadiness on feet: Secondary | ICD-10-CM

## 2023-05-30 DIAGNOSIS — I69351 Hemiplegia and hemiparesis following cerebral infarction affecting right dominant side: Secondary | ICD-10-CM | POA: Diagnosis not present

## 2023-05-30 NOTE — Therapy (Signed)
OUTPATIENT PHYSICAL THERAPY TREATMENT NOTE   Patient Name: Angela Buchanan MRN: [id number] DOB:Jul 08, 1951, 72 y.o., female Today's Date: 05/30/2023  PCP:  Garth Bigness, MD   REFERRING PROVIDER:  Garth Bigness, MD      END OF SESSION:   PT End of Session - 05/30/23 1704     Visit Number 39    Date for PT Re-Evaluation 07/20/23    Authorization Type [employer]    Authorization Time Period 8 visits 8/22-10/18/24    Authorization - Visit Number 1    Authorization - Number of Visits 8    Progress Note Due on Visit 40    PT Start Time 1618    PT Stop Time 1701    PT Time Calculation (min) 43 min    Activity Tolerance Patient tolerated treatment well    Behavior During Therapy [facility] for tasks assessed/performed                      Past Medical History:  Diagnosis Date   Atypical chest pain 07/19/2021   Bigeminy    no current med.   Breast cancer (HCC) 06/2017   right   Bruises easily    CAD in native artery 10/31/2022   Carotid bruit 11/13/2019   Dental crowns present    History of diverticulitis    Hypertension    states under control with med., has been on med. x 5 yr.   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   Past Surgical History:  Procedure Laterality Date   ABDOMINAL HYSTERECTOMY     partial   BREAST LUMPECTOMY Right 06/22/2017   Malignant   BREAST LUMPECTOMY WITH RADIOACTIVE SEED AND SENTINEL LYMPH NODE BIOPSY Right 06/22/2017   Procedure: RIGHT BREAST LUMPECTOMY WITH RADIOACTIVE SEED AND RIGHT AXILLARY DEEP SENTINEL LYMPH NODE BIOPSY WITH BLUE DYE INJECTION;  Surgeon: Claud Kelp, MD;  Location: [facility];  Service: General;  Laterality: Right;   BREAST  LUMPECTOMY WITH RADIOACTIVE SEED LOCALIZATION Left 06/22/2017   Procedure: LEFT BREAST LUMPECTOMY WITH RADIOACTIVE SEED LOCALIZATION;  Surgeon: Claud Kelp, MD;  Location: [facility];  Service: General;  Laterality: Left;   CATARACT EXTRACTION W/ INTRAOCULAR LENS  IMPLANT, BILATERAL Bilateral    EXCISION OF BREAST BIOPSY Left 06/22/2017   benign   LOOP RECORDER INSERTION N/A 05/16/2022   Procedure: LOOP RECORDER INSERTION;  Surgeon: Lanier Prude, MD;  Location: [facility];  Service: Cardiovascular;  Laterality: N/A;   PERCUTANEOUS PINNING Right 06/22/2022   Procedure: PERCUTANEOUS PINNING OF RIGHT HIP;  Surgeon: Bjorn Pippin, MD;  Location: [facility];  Service: Orthopedics;  Laterality: Right;   TONSILLECTOMY     age 76   Patient Active Problem List   Diagnosis Date Noted   Right spastic hemiplegia (HCC) 12/14/2022   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   CAD in native artery 10/31/2022   Malnutrition of moderate degree 06/22/2022   Closed fracture of femur, neck (HCC) 06/21/2022   Urinary frequency 06/21/2022   Hypokalemia 06/21/2022   Heart murmur 06/21/2022   Hypothyroidism 06/21/2022   Dysphagia 05/17/2022   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   CVA (cerebral vascular accident) (HCC) 05/12/2022   Osteoporosis 01/24/2022   Atypical chest  pain 07/19/2021   Temporomandibular joint (TMJ) pain 07/16/2020   Referred otalgia of both ears 07/16/2020   PAC (premature atrial contraction) 05/11/2020   PVC (premature ventricular contraction) 05/11/2020   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   Carotid bruit 11/13/2019   Pure hypercholesterolemia 11/13/2019   Malignant neoplasm of upper-outer quadrant of right breast in female, estrogen receptor positive (HCC) 05/22/2017   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   Diplopia 09/17/2014   Dizziness and giddiness 09/17/2014   Essential hypertension 07/22/2014   Chest pain 07/22/2014   Family history  of heart disease 07/22/2014    REFERRING DIAG:  R29.6 (ICD-10-CM) - Repeated falls, closed fracture of Rt hip    THERAPY DIAG:  Muscle weakness (generalized)  Unsteadiness on feet  Other abnormalities of gait and mobility  Rationale for Evaluation and Treatment Rehabilitation  PERTINENT HISTORY: hypertension, breast cancer, hyperlipidemia, Lt hip fracture secondary to fall 06/2022, CVA 06/2022   PRECAUTIONS:  Fall, Rt breast cancer with node removal, osteoporosis    SUBJECTIVE:                                                                                                                                                                                      SUBJECTIVE STATEMENT:  My right hand is feeling like it did before my stroke.  I feel stronger.   Pt spouse present during visit.   PAIN:  PAIN:  Are you having pain? Yes NPRS scale: 0/10 Pain location: Right shoulder PAIN TYPE: aching Aggravating factors: use Relieving factors: Medication    OBJECTIVE: (objective measures completed at initial evaluation unless otherwise dated)  DIAGNOSTIC FINDINGS: Rt hip fracture and ORIF    COGNITION: Overall cognitive status: Within functional limits for tasks assessed                         SENSATION: WFL  POSTURE: rounded shoulders, forward head, flexed trunk , and weight shift left   PALPATION: NA   LOWER EXTREMITY ROM: Rt hip limited by 50%, hamstrings limited by 50% bil.    LOWER EXTREMITY MMT:   MMT Right eval Right  09/27/22 Left eval Right 10/30/22 Right  01/17/23 Right 05/23/23 Left 10/30/22 Left 01/17/23  Hip flexion 3+ 4- 4- 4- 4 4 4- 4+  Hip extension            Hip abduction 3-  4     4  Hip adduction            Hip internal rotation            Hip external rotation  Knee flexion 3- 4- 3+  4 4  4+  Knee extension 3+ 4 4- 4 painful hip 4+ 4+ 4- 4+  Ankle dorsiflexion 3+ 4- 4- 4-   4   Ankle plantarflexion            Ankle inversion             Ankle eversion             (Blank rows = not tested)   FUNCTIONAL TESTS:   Eval: Timed up and go (TUG): 1 min, 27 seconds   09/27/22: 5x sit to stand: 29 seconds with use of Lt hand  TUG: 27 seconds   10/30/22: 5x sit to stand: 23 sec use of LT hand     TUG: 26 sec with standard cane   11/20/22: 5x sit to stand: 18.68 seconds    TUG: 19.45 without device    3 minute walk test: 225 feet with a standard cane   01/10/23: 5x sit to stand: 15.77 seconds   01/17/23: TUG with cane: 15.41 seconds with cane    3 min walk test: 313 feet with cane    03/07/23: 3 min walk 275 feet with single point cane   TUG: 16 seconds   03/14/23: 6 min walk test: 723 feet with standard cane    TUG: 14.16 without cane    5x sit to stand: 12.15 seconds without hand   03/28/23 TUG 11 seconds without cane  5x STS 10.6 seconds no Ues  04/18/2023: 6 min walk test:  532 ft with [facility]  05/09/23: 6 min walk test with SPC: 667 feet - RPE 5  05/23/23: TUG: 11 seconds without cane 5x sit to stand: 12.71 seconds  6 min walk test: 763 feet      TODAY'S TREATMENT:   DATE:  05/30/2023 Nustep level 3 x8 minutes with LE only. >55 spm PT present throughout to discuss status Farmers carry 6# Lt UE from gym mat table to mirror 2 laps Alternating step tap on 6" step 2x10 with CGA Leg Press (seat at 5) 55# 2x10, single leg: Lt 35#, Rt 15# 2x10 bil each Weight shifting to look behind to a spot on the wall Sit to stand: 5# 2x10 Step tap and step to with colored discs in a circle  DATE:  05/23/2023 Nustep level 3 x8 minutes with LE only. >55 spm PT present throughout to discuss status 6 min walk test, TUG and 5x sit to stand- see above Farmers carry 6# Lt UE from gym mat table to mirror 2 laps Alternating step tap on 6" step 2x10 with CGA Leg Press (seat at 5) 55# 2x10, single leg: Lt 35#, Rt 15# 2x10 bil each Hurdles: step to with CGA by PT on gait belt Sit to stand: 5# 2x10  DATE:  05/16/2023 Nustep level 3 x8  minutes with LE only. >55 spm PT present throughout to discuss status Farmers carry 6# Lt UE from gym mat table to mirror 2 laps Alternating step tap on 6" step 2x10 with CGA Leg Press (seat at 5) 55# 2x10, single leg: Lt 35#, Rt 15# 2x10 bil each Hurdles: step to with CGA by PT on gait belt Sit to stand: 5# 2x10  PATIENT EDUCATION:  Education details: Access Code: [email or web address] Person educated: Patient Education method: Explanation, Demonstration, and Handouts Education comprehension: verbalized understanding and returned demonstration   HOME EXERCISE PROGRAM: Access Code: [email or web address] URL: [email or web address] Date: 09/27/2022 Prepared by: Tresa Endo  Exercises - Seated  Long Arc Quad  - 3 x daily - 7 x weekly - 2 sets - 10 reps - 5 hold - Seated March   - 3 x daily - 7 x weekly - 3 sets - 10 reps - Seated Heel Toe Raises   - 3 x daily - 7 x weekly - 2 sets - 10 reps - Seated Isometric Hip Adduction with Ball  - 3 x daily - 7 x weekly - 2 sets - 10 reps - Sit to Stand with Armchair  - 2 x daily - 7 x weekly - 2 sets - 5-10 reps - Standing Hip Abduction with Counter Support  - 1 x daily - 7 x weekly - 2 sets - 10 reps - Heel Raises with Counter Support  - 2 x daily - 7 x weekly - 2 sets - 10 reps  Patient Education - Walking with a Single [employer] - Modified 4 Point Gait Pattern  ASSESSMENT:   CLINICAL IMPRESSION:      Pt is making steady progress and demonstrates improved independence and stability overall.  Functional measures are improved overall and pt requires less guarding from PT with all exercise in the clinic. Pt reports difficulty with turning look at something behind her and we simulated this to practice today.  She required stand by assistance with stepping to colored discs in a circle.  Pt with mobility deficits due to CVA followed by hip fracture requiring ORIF which has made progress slow.    Patient will benefit from skilled PT to address the below impairments  and improve overall function.   OBJECTIVE IMPAIRMENTS: Abnormal gait, decreased activity tolerance, decreased balance, decreased mobility, difficulty walking, decreased strength, decreased safety awareness, impaired perceived functional ability, impaired flexibility, postural dysfunction, and pain.    ACTIVITY LIMITATIONS: carrying, lifting, sitting, standing, stairs, transfers, hygiene/grooming, and locomotion level   PARTICIPATION LIMITATIONS: meal prep, cleaning, laundry, driving, shopping, and community activity   PERSONAL FACTORS: Age, Past/current experiences, and 1-2 comorbidities: CVA, falls, Rt hip fracture with ORIF  are also affecting patient's functional outcome.    REHAB POTENTIAL: Good   CLINICAL DECISION MAKING: Evolving/moderate complexity   EVALUATION COMPLEXITY: Moderate     GOALS: Goals reviewed with patient? Yes   SHORT TERM GOALS: Target date: 09/06/2022   Be independent in initial HEP Baseline: Goal status: Goal met 08/14/22   2.  Improve LE strength to perform sit to stand with moderate Lt UE support Baseline:  Goal status: Goal met 08/28/22  3.  Perform TUG in < or = to 60 seconds to reduce falls risk Baseline: 27 seconds (09/27/22) Goal status: MET      LONG TERM GOALS: Target date:  07/20/23   Be independent in advanced HEP Baseline: independent in current HEP and has tolerated progress when appropriate (05/23/23)  Goal status: in progress    2.  Perform TUG in < or = to 13 seconds to reduce falls risk Baseline: 11 seconds without cane (05/23/23) Goal status: met   3.  ambulate > or = to 875 feet in 6 minutes to improve community ambulation and independence  Baseline: 763 (05/23/23) Goal status: In progress      4.  perform 5x sit to stand in < or = to 12 seconds to reduce falls risk  Baseline: 12.71 seconds (05/23/23)  Goal Status: In progress   8. Improve balance to walk her dog short distances with independence   Baseline: walking  driveway with dog (05/23/23)  Goal  status: in progress     PLAN:   PT FREQUENCY: 1x/week   PT DURATION: 10 weeks   PLANNED INTERVENTIONS: Therapeutic exercises, Therapeutic activity, Neuromuscular re-education, Balance training, Gait training, Patient/Family education, Self Care, Joint mobilization, Stair training, Dry Needling, Electrical stimulation, Cryotherapy, Moist heat, Taping, Manual therapy, and Re-evaluation   PLAN FOR NEXT SESSION: Continue to work on gait , strength and balance. Endurance, change of direction.   Step taps to pods in circle   Lorrene Reid, PT 05/30/23 5:05 PM    [facility] [address] [place], [place] [address] Phone # [phone] Fax [phone]

## 2023-05-31 ENCOUNTER — Ambulatory Visit: Payer: Medicare PPO | Admitting: Occupational Therapy

## 2023-05-31 ENCOUNTER — Ambulatory Visit (INDEPENDENT_AMBULATORY_CARE_PROVIDER_SITE_OTHER): Payer: Medicare PPO

## 2023-05-31 DIAGNOSIS — M6281 Muscle weakness (generalized): Secondary | ICD-10-CM

## 2023-05-31 DIAGNOSIS — M25611 Stiffness of right shoulder, not elsewhere classified: Secondary | ICD-10-CM | POA: Diagnosis not present

## 2023-05-31 DIAGNOSIS — I639 Cerebral infarction, unspecified: Secondary | ICD-10-CM | POA: Diagnosis not present

## 2023-05-31 DIAGNOSIS — R2681 Unsteadiness on feet: Secondary | ICD-10-CM | POA: Diagnosis not present

## 2023-05-31 DIAGNOSIS — R278 Other lack of coordination: Secondary | ICD-10-CM | POA: Diagnosis not present

## 2023-05-31 DIAGNOSIS — I69351 Hemiplegia and hemiparesis following cerebral infarction affecting right dominant side: Secondary | ICD-10-CM | POA: Diagnosis not present

## 2023-05-31 LAB — CUP PACEART REMOTE DEVICE CHECK
Date Time Interrogation Session: 20240829011800
Implantable Pulse Generator Implant Date: 20230815
Pulse Gen Serial Number: 183424

## 2023-05-31 NOTE — Therapy (Signed)
OUTPATIENT OCCUPATIONAL THERAPY NEURO  Treatment Note & Progress Note  Patient Name: Angela Buchanan MRN: [id number] DOB:1951/07/24, 72 y.o., female Today's Date: 05/31/2023  PCP: Shon Hale, MD REFERRING PROVIDER: Shon Hale, MD  Occupational Therapy Progress Note  Dates of Reporting Period: 04/16/23 to 05/31/23  Objective Measurements: Pt is demonstrating mild improvements in R shoulder and elbow ROM, decreased movements in wrist and digits most notably with index finger flexion.    Plan: Plan to continue to address ROM and tone in dominant RUE as well as focus on gross grasp and dexterity.   END OF SESSION:  OT End of Session - 05/31/23 1403     Visit Number 10    Number of Visits 17    Date for OT Re-Evaluation 06/15/23    Authorization Type [employer]    OT Start Time 1401    OT Stop Time 1445    OT Time Calculation (min) 44 min    Behavior During Therapy WFL for tasks assessed/performed                      Past Medical History:  Diagnosis Date   Atypical chest pain 07/19/2021   Bigeminy    no current med.   Breast cancer (HCC) 06/2017   right   Bruises easily    CAD in native artery 10/31/2022   Carotid bruit 11/13/2019   Dental crowns present    History of diverticulitis    Hypertension    states under control with med., has been on med. x 5 yr.   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   Past Surgical History:  Procedure Laterality Date   ABDOMINAL HYSTERECTOMY     partial   BREAST LUMPECTOMY Right 06/22/2017   Malignant   BREAST LUMPECTOMY WITH RADIOACTIVE SEED AND SENTINEL LYMPH NODE BIOPSY Right 06/22/2017   Procedure: RIGHT BREAST LUMPECTOMY WITH RADIOACTIVE SEED  AND RIGHT AXILLARY DEEP SENTINEL LYMPH NODE BIOPSY WITH BLUE DYE INJECTION;  Surgeon: Claud Kelp, MD;  Location: [facility];  Service: General;  Laterality: Right;   BREAST LUMPECTOMY WITH RADIOACTIVE SEED LOCALIZATION Left 06/22/2017   Procedure: LEFT BREAST LUMPECTOMY WITH RADIOACTIVE SEED LOCALIZATION;  Surgeon: Claud Kelp, MD;  Location: [facility];  Service: General;  Laterality: Left;   CATARACT EXTRACTION W/ INTRAOCULAR LENS  IMPLANT, BILATERAL Bilateral    EXCISION OF BREAST BIOPSY Left 06/22/2017   benign   LOOP RECORDER INSERTION N/A 05/16/2022   Procedure: LOOP RECORDER INSERTION;  Surgeon: Lanier Prude, MD;  Location: [facility];  Service: Cardiovascular;  Laterality: N/A;   PERCUTANEOUS PINNING Right 06/22/2022   Procedure: PERCUTANEOUS PINNING OF RIGHT HIP;  Surgeon: Bjorn Pippin, MD;  Location: [facility];  Service: Orthopedics;  Laterality: Right;   TONSILLECTOMY     age 55   Patient Active Problem List   Diagnosis Date Noted   Right spastic hemiplegia (HCC) 12/14/2022   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   CAD in native artery 10/31/2022   Malnutrition of moderate degree 06/22/2022   Closed fracture of femur, neck (HCC) 06/21/2022   Urinary frequency 06/21/2022   Hypokalemia 06/21/2022   Heart murmur 06/21/2022   Hypothyroidism 06/21/2022  Dysphagia 05/17/2022   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 05/11/2020   PVC (premature ventricular contraction) 05/11/2020   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   Carotid bruit 11/13/2019   Pure hypercholesterolemia 11/13/2019   Malignant neoplasm of upper-outer quadrant of right breast in female, estrogen receptor positive (HCC)  05/22/2017   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   Diplopia 09/17/2014   Dizziness and giddiness 09/17/2014   Essential hypertension 07/22/2014   Chest pain 07/22/2014   Family history of heart disease 07/22/2014    ONSET DATE: 05/13/22 (new referral 04/09/23)  REFERRING DIAG: G83.23 (ICD-10-CM) - 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 Pt accompanied by: self and significant other ([name])  PERTINENT HISTORY: hypertension, breast cancer, hyperlipidemia, Lt hip fracture secondary to fall 06/2022, CVA 05/2022    PRECAUTIONS: Fall, Rt breast cancer with node removal, osteoporosis   WEIGHT BEARING RESTRICTIONS: No  PAIN:  Are you having pain? No  FALLS: Has patient fallen in last 6 months? No  LIVING ENVIRONMENT: Lives with: lives with their spouse Lives in: House/apartment Stairs: Yes: External: 2 steps; on left going up Has following equipment at home: Quad cane large base, Environmental consultant - 2 wheeled, Hemi walker, Wheelchair (manual), shower chair, and Grab bars  PLOF: Independent and Independent with basic ADLs  PATIENT GOALS: to have better use my arm and hand  OBJECTIVE:   HAND DOMINANCE: Right  ADLs:  Transfers/ambulation related to ADLs: Utilizes [facility] for mobility in the community, 80% in the house but will walk without occasionally Eating: husband cuts foods, utilizing built up handles for self-feeding Grooming: needs assistance with washing hair UB Dressing: needs assistance with fastening bra LB Dressing: Mod-max assist for donning  pants due to fear of falling, able to don socks and slip on shoes - unable to tie shoes Toileting: Mod I Bathing: Supervision Tub Shower transfers: Supervision, but able to complete transfers with use of grab bars Equipment: Shower seat with back, Grab bars, and Walk in shower  IADLs: Handwriting:  TBA at next session  MOBILITY STATUS: Needs Assist: utilized [facility] for mobility and Hx of falls  POSTURE COMMENTS:  rounded shoulders and forward head  ACTIVITY TOLERANCE: Activity tolerance: fatigues with increased mobility and effort  FUNCTIONAL OUTCOME MEASURES: Upper Extremity Functional Scale (UEFS): TBD  UPPER EXTREMITY ROM:    Active ROM Right eval Right 05/31/23  Shoulder flexion 87 93  Shoulder abduction 78 82  Shoulder adduction    Shoulder extension    Shoulder internal rotation 90% 100%  Shoulder external rotation 50% 75%  Elbow flexion 132 137  Elbow extension -17 -12  Wrist flexion 19 40  Wrist extension 35 35  Wrist ulnar deviation 15 10  Wrist radial deviation 13 15  Wrist pronation WNL   Wrist supination 20 from neutral 65*  (Blank rows = not tested)  UPPER EXTREMITY MMT:     MMT Right eval Left eval  Shoulder flexion  Shoulder abduction    Shoulder adduction    Shoulder extension    Shoulder internal rotation    Shoulder external rotation    Middle trapezius    Lower trapezius    Elbow flexion    Elbow extension    Wrist flexion    Wrist extension    Wrist ulnar deviation    Wrist radial deviation    Wrist pronation    Wrist supination    (Blank rows = not tested)    Active ROM Extension (as flexion to loose gross grasp) Right eval Right 05/31/23  Thumb MCP (0-60)    Thumb IP (0-80)    Thumb Radial abd/add (0-55)    Thumb Palmar abd/add (0-45)    Thumb opposition to index    Index MCP (0-90) 0   Index PIP (0-100) -30 -50  Index DIP (0-70) -40 -35  Long MCP (0-90) -5 0  Long PIP (0-100) -45 -35  Long DIP (0-70) -5 -25  Ring  MCP (0-90_ 0 0  Ring PIP (0-100) -40 -40  Ring DIP (0-70) -20 -20  Little MCP (0-90) -7 0  Little PIP (0-100) -25 -25  Little DIP (0-70) -5 0  (Blank rows = not tested)    HAND FUNCTION: 05/31/23 Grip strength: Right: 9#  Grip strength: Right: 7,9 = 8# lbs; Left: 30 lbs  COORDINATION: 05/31/23 9 Hole Peg test: Right: able to pick up one peg with thumb and long finger, however unable to rotate to place into peg board Box and Blocks:  Right 15 blocks  Finger Nose Finger test: slow and deliberate with RUE, but able to touch both nose and finger at this time 9 Hole Peg test: Right: unable to pick up any pegs with RUE  Box and Blocks:  Right 16 blocks, Left 55 blocks  SENSATION: Light touch: WFL Hot/Cold: WFL  EDEMA: no swelling on eval  MUSCLE TONE: RUE: Mild and Hypertonic  COGNITION: Overall cognitive status: Within functional limits for tasks assessed  VISION: Subjective report: pt reports wearing glasses more since CVA Baseline vision: Wears glasses all the time, double vision after cataract removal Visual history: cataracts  OBSERVATIONS: Pt attempting grasp and pinch activities, frequently not incorporating index finger on R with increased flexion away from task.   TODAY'S TREATMENT:                                                           05/31/23 Measurements (see above): noted mild improvements in shoulder and elbow ROM, however decreased ROM in wrist and digits, most notably index finger. 9 hole peg test: pt able to pick up peg with thumb and long finger, place pegs in pegboard when positioned into thumb and index finger by L hand.  Pt able to achieve tip to tip pinch to remove pegs.  Pt wanting to try agai to facilitate increased carryover with picking up and rotation of pegs.  Attempted rotation with tip to tip and use of long finger and/or use of index finger to rotate peg. OT educated on attempts at rotation with pen, checker, and coin - coin to flat and pen too  long.  Attempted with use of checker for increased diameter with ability to pick up, however pt frustrated with decreased ability to complete and stated "I'm done"  UE HEP: OT instructed in wrist flexion/extension with PROM to facilitate increased stretch and ROM. Instructed in ulnar/radial deviation with arm positioned on table top to facilitate increased ROM.  Reviewed flexion and extension of fingers with blocking.    05/29/23 Forced use: engaged in stacking/unstacking blocks with focus on incorporation of R index finger and shoulder flexion with increased heigh of block stack.  Pt able to stack up to 5 blocks with use of index finger and thumb with focus on flexion and extension.  Pt requiring increased time to obtain grasp and release, knocking over 20% of time.  Transitioned to placing rings onto vertical dowel to further facilitate sustained grasp with shoulder flexion.  Pt continues to be limited in shoulder flexion with functional reach, ~80-90* shoulder flexion. UE ROM: OT instructed in wrist flexion/extension as well as ulnar/radial deviation.  OT completing PROM in ulnar and radial deviation with noted increased tone in both directions.  OT educated pt on importance of adding ulnar and radial deviation to wrist flexion/extension exercises and carryover of movement into functional tasks (turning on water faucet). Kinesiotape: removed previous kinesiotape due to pealing and applied kinesiotape to R hand and digits in retrograde fashion to facilitate increased passive extension while still allowing pt to engage in grasp and release exercises.   05/24/23 Engaged in functional reach with RUE with focus on shoulder flexion, internal/external rotation, and wrist flexion/extension and radial deviation.  OT holding cones in low and mid range to facilitate elbow flexion/extension and shoulder flexion.  Pt able to reach to ~80* without onset of shoulder hike, however any higher demonstrating hike and L  lean.  Pt demonstrating increased wrist flexion and extension when grasping cone, however still with difficulty releasing cone from grasp.  Transitioned to picking up large grip pegs with focus on incorporating index finger into pinch and again release of grasp.  Engaged in supination with picking up card with gross grasp and flipping forearm and palm to release.  OT encouraged pt to exaggerate opening and release of object, however primary focus on forearm supination. Kinesiotape: applied kinesiotape to R hand and digits in retrograde fashion to facilitate increased passive extension while still allowing pt to engage in grasp and release exercises.    PATIENT EDUCATION: Education details: ongoing condition specific education. Person educated: Patient and Spouse Education method: Explanation, Demonstration, and Verbal cues Education comprehension: verbalized understanding and needs further education  HOME EXERCISE PROGRAM: Access Code: [email or web address] URL: [email or web address] Date: 04/23/2023 Prepared by: [facility]    GOALS: Goals reviewed with patient? Yes  SHORT TERM GOALS: Target date: 05/18/23  Pt and spouse will be independent with HEP for ROM, strengthening, and coordination.  Baseline: Goal status:MET  2.  Pt will verbalize understanding of task modifications and/or potential AE needs to increase ease, safety, and independence w/ ADLs.  Baseline: Goal status: IN PROGRESS  LONG TERM GOALS: Target date: 06/15/23  Pt will demonstrate improved functional grasp in RUE by 6# to open new jar, increased handwriting, and self-feeding.  Baseline: R: 8#, L: 30# Goal status: IN PROGRESS  2.  Pt will demonstrate improved UE functional use for ADLs as evidenced by increasing box/ blocks score by 6 blocks with RUE. Baseline: R: 16 blocks, L: 55 blocks Goal status: IN PROGRESS  3.  Pt will demonstrate improved shoulder ROM (shoulder  flexion and external rotation) as needed for ADLs/IADLs (washing hair, putting on jacket, etc).  Baseline:  Goal status: IN  PROGRESS  4.  Pt will demonstrate improvement of finger extension by 15* TAM of index and long ringer finger to allow for more functional hand opening.  Baseline: -70 IF, -55 LF Goal status: IN PROGRESS  5.  Pt will demonstrate improved handwriting with ability to write name and simple grocery list with 90% legibility. Baseline:  Goal status: IN PROGRESS  6.  Pt will demonstrate improvements in functional use of dominant RUE as evidenced by improvements on FOTO. Baseline: TBD Goal status: IN PROGRESS  ASSESSMENT:  CLINICAL IMPRESSION: Pt demonstrating mild progress in shoulder and elbow ROM, however continues to demonstrate decreased wrist and finger ROM.  However pt with ability to pick up small pegs and checkers with gross grasp and forced use of index finger.  Pt with increased frustration when attempting to rotated pegs and checkers.  OT reiterated PROM and AROM of wrist in all directions and finger flexion and extension.  Pt demonstrating increased active index finger flexion/extension with massed practice continues to be limited by shoulder flexion with functional reach and functional tasks.  Pt will continue to benefit from OT service to address ROM and functional grasp and pinch of dominant RUE.  PERFORMANCE DEFICITS: in functional skills including ADLs, IADLs, coordination, dexterity, tone, ROM, strength, pain, flexibility, Fine motor control, Gross motor control, mobility, balance, body mechanics, endurance, decreased knowledge of precautions, decreased knowledge of use of DME, and UE functional use and psychosocial skills including environmental adaptation and routines and behaviors.   IMPAIRMENTS: are limiting patient from ADLs and IADLs.   CO-MORBIDITIES: may have co-morbidities  that affects occupational performance. Patient will benefit from skilled OT to  address above impairments and improve overall function.  MODIFICATION OR ASSISTANCE TO COMPLETE EVALUATION: Min-Moderate modification of tasks or assist with assess necessary to complete an evaluation.  OT OCCUPATIONAL PROFILE AND HISTORY: Detailed assessment: Review of records and additional review of physical, cognitive, psychosocial history related to current functional performance.  CLINICAL DECISION MAKING: Moderate - several treatment options, min-mod task modification necessary  REHAB POTENTIAL: Good  EVALUATION COMPLEXITY: Moderate    PLAN:  OT FREQUENCY: 2x/week  OT DURATION: 8 weeks  PLANNED INTERVENTIONS: self care/ADL training, therapeutic exercise, therapeutic activity, neuromuscular re-education, manual therapy, passive range of motion, functional mobility training, splinting, electrical stimulation, ultrasound, compression bandaging, moist heat, cryotherapy, patient/family education, psychosocial skills training, energy conservation, coping strategies training, and DME and/or AE instructions  RECOMMENDED OTHER SERVICES: NA  CONSULTED AND AGREED WITH PLAN OF CARE: Patient and family member/caregiver  PLAN FOR NEXT SESSION: initiate gross grasp tasks, further address wrist flexion/extension and ulnar/radial deviation, supination, and finger flexion and extension     Sharlie Shreffler, OTR/L 05/31/2023, 2:04 PM

## 2023-06-05 ENCOUNTER — Ambulatory Visit: Payer: Medicare PPO | Admitting: Neurology

## 2023-06-05 ENCOUNTER — Inpatient Hospital Stay: Payer: Medicare PPO | Admitting: Neurology

## 2023-06-05 ENCOUNTER — Encounter: Payer: Self-pay | Admitting: Neurology

## 2023-06-05 ENCOUNTER — Ambulatory Visit: Payer: Medicare PPO | Attending: Family Medicine | Admitting: Occupational Therapy

## 2023-06-05 VITALS — BP 130/66 | HR 60 | Ht 61.0 in | Wt 102.0 lb

## 2023-06-05 DIAGNOSIS — M79601 Pain in right arm: Secondary | ICD-10-CM | POA: Diagnosis not present

## 2023-06-05 DIAGNOSIS — I69351 Hemiplegia and hemiparesis following cerebral infarction affecting right dominant side: Secondary | ICD-10-CM | POA: Diagnosis not present

## 2023-06-05 DIAGNOSIS — R278 Other lack of coordination: Secondary | ICD-10-CM | POA: Diagnosis not present

## 2023-06-05 DIAGNOSIS — M6281 Muscle weakness (generalized): Secondary | ICD-10-CM | POA: Diagnosis not present

## 2023-06-05 DIAGNOSIS — M25611 Stiffness of right shoulder, not elsewhere classified: Secondary | ICD-10-CM | POA: Diagnosis not present

## 2023-06-05 DIAGNOSIS — R2689 Other abnormalities of gait and mobility: Secondary | ICD-10-CM | POA: Insufficient documentation

## 2023-06-05 DIAGNOSIS — G8110 Spastic hemiplegia affecting unspecified side: Secondary | ICD-10-CM

## 2023-06-05 DIAGNOSIS — I639 Cerebral infarction, unspecified: Secondary | ICD-10-CM | POA: Diagnosis not present

## 2023-06-05 NOTE — Progress Notes (Signed)
[facility] [address] [place]. [place] [address] [phone]       OFFICE FOLLOW-UP NOTE  Ms. AZAILA HASBERRY Date of Birth:  06/08/1951 Medical Record Number:  [id number]   HPI: Ms. Portales is a 72 year old lady seen today for initial office follow-up visit following hospital consultation for stroke in August 2023.  She is accompanied by her husband.  History is obtained from them and review of electronic medical records.  I personally reviewed pertinent available imaging films in PACS.  She has past medical history of hypertension, hyperlipidemia, coronary artery disease who presented on 05/12/2022 with sudden onset of right-sided weakness and difficulty speaking.  She developed sudden onset of difficulty using the right hand while writing and had trouble forming numbers.  This lasted about 30 minutes and she was not sure if this involves her speech as well but she did notice difficulty walking with right-sided weakness and numbness.  She had recurrent fluctuating symptoms which persisted and been evaluated in the emergency room she had mild right-sided weakness and numbness.  NIH stroke scale was 4.  She was given IV TNK and observed in the ICU and did well.  Code stroke CT head showed no acute abnormality with aspects score of 10.  CT angiogram head and neck showed no large vessel stenosis or occlusion.  Since she had fluctuating symptoms CT angiogram was repeated the next day which again showed no significant abnormality.  MRI scan of the brain showed acute large left corona radiator subcortical infarct involving lateral margin of the left lateral ventricle.  2D echo showed ejection fraction of 60 to 65%.  LDL cholesterol 167 mg percent.  Hemoglobin A1c was 5.7.  Patient's stroke was felt to be cryptogenic in etiology given the large size and she underwent loop recorder insertion which so far has not yet shown paroxysmal A-fib and last checked on 05/31/2023.  Patient was started on  dual antiplatelet therapy of aspirin and Plavix for 3 weeks and then Plavix alone however she is continuing to take both of them and is tolerating it with minor bruising and no bleeding.  She initially went to inpatient rehab and did well and was discharged home but a week later she unfortunately fell and fractured her right hip requiring surgery and another prolonged stay in rehab.  She is currently at home.  She is doing outpatient physical and Occupational Therapy.  She ambulates with a cane.  She still has some mild right-sided weakness.  Her speech is much better though occasionally when she is tired she slurs some words.  She has fortunately had no recurrent falls.  She is tolerating aspirin and Plavix well with bruising but no bleeding.  She is tolerating for alcohol well without muscle aches and pains. ROS:   14 system review of systems is positive for weakness, stiffness, gait difficulty, slurred speech, bruising all other systems negative  PMH:  Past Medical History:  Diagnosis Date   Atypical chest pain 07/19/2021   Bigeminy    no current med.   Breast cancer (HCC) 06/2017   right   Bruises easily    CAD in native artery 10/31/2022   Carotid bruit 11/13/2019   Dental crowns present    History of diverticulitis    Hypertension    states under control with med., has been on med. x 5 yr.   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    Social History:  Social History   Socioeconomic History   Marital status: Married    Spouse name: Not on file   Number of children: 0   Years of education: college   Highest education level: Not on file  Occupational History   Occupation: [employer] features editor: [employer]  Tobacco Use    Smoking status: Never   Smokeless tobacco: Never  Vaping Use   Vaping status: Never Used  Substance and Sexual Activity   Alcohol use: No    Alcohol/week: 0.0 standard drinks of alcohol   Drug use: No   Sexual activity: Never    Birth control/protection: Surgical    Comment: hyst  Other Topics Concern   Not on file  Social History Narrative   Patient lives at home with husband  ([name]).    Patient is rtired caoll   Right handed.   Caffeine 8oz . Caffeine daily   Social Determinants of Health   Financial Resource Strain: Not on file  Food Insecurity: No Food Insecurity (06/21/2022)   Hunger Vital Sign    Worried About Running Out of Food in the Last Year: Never true    Ran Out of Food in the Last Year: Never true  Transportation Needs: No Transportation Needs (06/21/2022)   PRAPARE - Administrator, Civil Service (Medical): No    Lack of Transportation (Non-Medical): No  Physical Activity: Not on file  Stress: Not on file  Social Connections: Not on file  Intimate Partner Violence: Not At Risk (06/21/2022)   Humiliation, Afraid, Rape, and Kick questionnaire    Fear of Current or Ex-Partner: No    Emotionally Abused: No    Physically Abused: No    Sexually Abused: No    Medications:   Current Outpatient Medications on File Prior to Visit  Medication Sig Dispense Refill   amLODipine (NORVASC) 10 MG tablet Take 1 tablet (10 mg total) by mouth daily. 90 tablet 3   aspirin EC 81 MG tablet Take 1 tablet (81 mg total) by mouth daily. Swallow whole. 30 tablet 12   calcium-vitamin D (OSCAL WITH D) 500-5 MG-MCG tablet Take 1 tablet by mouth daily with breakfast. 30 tablet 0   cetirizine (ZYRTEC) 10 MG tablet Take 10 mg by mouth daily.     Cholecalciferol (VITAMIN D) 50 MCG (2000 UT) tablet Take 2,000 Units by mouth daily.     clobetasol ointment (TEMOVATE) 0.05 % Apply topically 2 (two) times daily as needed (itching). (Patient taking differently: Apply 1 Application  topically 2 (two) times daily as needed (itching).) 30 g 0   clopidogrel (PLAVIX) 75 MG tablet Take 1 tablet (75 mg total) by mouth daily. 18 tablet 0   docusate sodium (COLACE) 100 MG capsule Take 1 capsule (100 mg total) by mouth 2 (two) times daily. 10 capsule 0   ezetimibe (ZETIA) 10 MG tablet Take 1 tablet (10 mg total) by mouth daily. 90 tablet 3   feeding supplement (ENSURE ENLIVE / ENSURE PLUS) LIQD Take 237 mLs by mouth 2 (two) times daily between meals. 237 mL 12   fluticasone (FLONASE) 50 MCG/ACT nasal spray Place 1 spray into both nostrils daily as needed for allergies or rhinitis.     hydrOXYzine (ATARAX) 25 MG tablet Take 25 mg by mouth 3 (three)  times daily as needed for nausea or vomiting.     isosorbide mononitrate (IMDUR) 30 MG 24 hr tablet Take 0.5 tablets (15 mg total) by mouth daily. 45 tablet 3   levothyroxine (SYNTHROID) 25 MCG tablet Take 1 tablet (25 mcg total) by mouth daily at 6 (six) AM. 30 tablet 0   lisinopril (ZESTRIL) 40 MG tablet Take 40 mg by mouth daily.     meclizine (ANTIVERT) 25 MG tablet Take 25 mg by mouth daily as needed for dizziness.     methocarbamol (ROBAXIN) 500 MG tablet Take 1 tablet (500 mg total) by mouth every 6 (six) hours as needed for muscle spasms. 30 tablet 0   metoprolol tartrate (LOPRESSOR) 25 MG tablet Take 1 tablet (25 mg total) by mouth every evening. 90 tablet 1   Multiple Vitamins-Minerals (CENTRUM SILVER 50+WOMEN PO) Take 1 tablet by mouth daily.     nystatin (MYCOSTATIN) 100000 UNIT/ML suspension Take 5 mLs (500,000 Units total) by mouth 4 (four) times daily. 60 mL 1   polyethylene glycol (MIRALAX / GLYCOLAX) 17 g packet Take 17 g by mouth daily as needed for mild constipation. 14 each 0   potassium chloride SA (KLOR-CON M20) 20 MEQ tablet Take 1 tablet (20 mEq total) by mouth daily. Alternate 20 and tablets every other day 90 tablet 3   pravastatin (PRAVACHOL) 40 MG tablet Take 1 tablet (40 mg total) by mouth daily. 30 tablet 0    senna (SENOKOT) 8.6 MG TABS tablet Take 2 tablets (17.2 mg total) by mouth daily as needed for mild constipation. 120 tablet 0   ferrous sulfate 325 (65 FE) MG tablet Take 1 tablet (325 mg total) by mouth 2 (two) times daily with a meal. (Patient not taking: Reported on 06/05/2023) 90 tablet 3   HYDROcodone-acetaminophen (NORCO) 5-325 MG tablet Take 1 tablet by mouth every 6 (six) hours as needed for severe pain. (Patient not taking: Reported on 06/05/2023) 30 tablet 0   oxyCODONE-acetaminophen (PERCOCET) 5-325 MG tablet Take 1-2 tablets by mouth every 6 (six) hours as needed. (Patient not taking: Reported on 06/05/2023) 20 tablet 0   pantoprazole (PROTONIX) 40 MG tablet Take 1 tablet (40 mg total) by mouth daily at 12 noon. (Patient not taking: Reported on 06/05/2023) 30 tablet 0   No current facility-administered medications on file prior to visit.    Allergies:   Allergies  Allergen Reactions   Atenolol Other (See Comments)    CHEST PAIN   Bisoprolol Other (See Comments)    "FEELS WEIRD"   Prednisone Other (See Comments)    UNABLE TO FOCUS   Amoxicillin     unknown   Amoxicillin-Pot Clavulanate Diarrhea and Other (See Comments)   Hctz [Hydrochlorothiazide]     Hyponatremia    Rosuvastatin     myalgias   Latex Rash    Physical Exam General: well developed, well nourished pleasant elderly Caucasian lady, seated, in no evident distress Head: head normocephalic and atraumatic.  Neck: supple with no carotid or supraclavicular bruits Cardiovascular: regular rate and rhythm, no murmurs Musculoskeletal: no deformity Skin:  no rash/petichiae Vascular:  Normal pulses all extremities Vitals:   06/05/23 1106  BP: 130/66  Pulse: 60   Neurologic Exam Mental Status: Awake and fully alert. Oriented to place and time. Recent and remote memory intact. Attention span, concentration and fund of knowledge appropriate. Mood and affect appropriate.  Mild dysarthria.  No aphasia. Cranial Nerves:  Fundoscopic exam reveals sharp disc margins. Pupils equal, briskly  reactive to light. Extraocular movements full without nystagmus. Visual fields full to confrontation. Hearing intact. Facial sensation intact.  Mild right lower facial weakness., tongue, palate moves normally and symmetrically.  Motor: Mild right upper extremity drift.  Weakness of right grip and intrinsic hand muscles.  Orbits left or right upper extremity.  Mild right hip flexor and ankle dorsiflexor weakness.  Increased tone on the right with mild spasticity.   Sensory.: intact to touch ,pinprick .position and vibratory sensation.  Coordination: Rapid alternating movements normal in all extremities. Finger-to-nose and heel-to-shin performed accurately bilaterally. Gait and Station: Arises from chair without difficulty. Stance is normal. Gait demonstrate spastic hemiplegic gait with mild right foot drop and stiffness of the right leg.   Reflexes: 2+ and asymmetric brisker on the right. Toes downgoing.   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  Vascular risk factors of hypertension and hyperlipidemia.     PLAN: I had a long d/w patient about his recent stroke, risk for recurrent stroke/TIAs, personally independently reviewed imaging studies and stroke evaluation results and answered questions.Continue clopidogrel 75 mg daily alone now and discontinue aspirin for secondary stroke prevention and maintain strict control of hypertension with blood pressure goal below 130/90, diabetes with hemoglobin A1c goal below 6.5% and lipids with LDL cholesterol goal below 70 mg/dL. I also advised the patient to eat a healthy diet with plenty of whole grains, cereals, fruits and vegetables, exercise regularly and maintain ideal body weight .continue ongoing physical and occupational therapy and ambulate using a  cane at all times.  We also discussed fall safety precautions.  Check screening carotid ultrasound study.  Refer to Dr. Wynn Banker for evaluation for ViVistim program to improve right hand range of motion followup in the future with me in 6 months or call earlier if necessary. Greater than 50% of time during this 35 minute visit was spent on counseling,explanation of diagnosis, planning of further management, discussion with patient and family and coordination of care Delia Heady, MD Note: This document was prepared with digital dictation and possible smart phrase technology. Any transcriptional errors that result from this process are unintentional

## 2023-06-05 NOTE — Therapy (Signed)
OUTPATIENT OCCUPATIONAL THERAPY NEURO  Treatment Note  Patient Name: KHALI STARKOVICH MRN: [id number] DOB:1950/11/07, 72 y.o., female Today's Date: 06/05/2023  PCP: Shon Hale, MD REFERRING PROVIDER: Shon Hale, MD     END OF SESSION:  OT End of Session - 06/05/23 1525     Visit Number 11    Number of Visits 17    Date for OT Re-Evaluation 06/15/23    Authorization Type [employer]    OT Start Time 1447    OT Stop Time 1531    OT Time Calculation (min) 44 min    Behavior During Therapy [facility] for tasks assessed/performed                       Past Medical History:  Diagnosis Date   Atypical chest pain 07/19/2021   Bigeminy    no current med.   Breast cancer (HCC) 06/2017   right   Bruises easily    CAD in native artery 10/31/2022   Carotid bruit 11/13/2019   Dental crowns present    History of diverticulitis    Hypertension    states under control with med., has been on med. x 5 yr.   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   Past Surgical History:  Procedure Laterality Date   ABDOMINAL HYSTERECTOMY     partial   BREAST LUMPECTOMY Right 06/22/2017   Malignant   BREAST LUMPECTOMY WITH RADIOACTIVE SEED AND SENTINEL LYMPH NODE BIOPSY Right 06/22/2017   Procedure: RIGHT BREAST LUMPECTOMY WITH RADIOACTIVE SEED AND RIGHT AXILLARY DEEP SENTINEL LYMPH NODE BIOPSY WITH BLUE DYE INJECTION;  Surgeon: Claud Kelp, MD;  Location: [facility];  Service: General;  Laterality: Right;   BREAST LUMPECTOMY WITH RADIOACTIVE SEED LOCALIZATION Left 06/22/2017   Procedure: LEFT BREAST LUMPECTOMY WITH RADIOACTIVE SEED LOCALIZATION;  Surgeon: Claud Kelp, MD;  Location: [facility];  Service: General;  Laterality: Left;   CATARACT EXTRACTION W/ INTRAOCULAR LENS  IMPLANT, BILATERAL Bilateral    EXCISION OF BREAST BIOPSY Left 06/22/2017   benign   LOOP RECORDER INSERTION N/A 05/16/2022   Procedure: LOOP RECORDER INSERTION;  Surgeon: Lanier Prude, MD;  Location: [facility];  Service: Cardiovascular;  Laterality: N/A;   PERCUTANEOUS PINNING Right 06/22/2022   Procedure: PERCUTANEOUS PINNING OF RIGHT HIP;  Surgeon: Bjorn Pippin, MD;  Location: [facility];  Service: Orthopedics;  Laterality: Right;   TONSILLECTOMY     age 84   Patient Active Problem List   Diagnosis Date Noted   Right spastic hemiplegia (HCC) 12/14/2022   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 05/11/2020   PVC (premature ventricular contraction) 05/11/2020   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   Carotid  bruit 11/13/2019   Pure hypercholesterolemia 11/13/2019   Malignant neoplasm of upper-outer quadrant of right breast in female, estrogen receptor positive (HCC) 05/22/2017   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   Diplopia 09/17/2014   Dizziness and giddiness 09/17/2014   Essential hypertension 07/22/2014   Chest pain 07/22/2014   Family history of heart disease 07/22/2014    ONSET DATE: 05/13/22 (new referral 04/09/23)  REFERRING DIAG: G83.23 (ICD-10-CM) - 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 Pt accompanied by: self and significant other ([name])  PERTINENT HISTORY: hypertension, breast cancer, hyperlipidemia, Lt hip fracture secondary to fall 06/2022, CVA 05/2022    PRECAUTIONS: Fall, Rt breast cancer with node removal, osteoporosis   WEIGHT BEARING RESTRICTIONS: No  PAIN:  Are you having pain? No  FALLS: Has patient fallen in last 6 months? No  LIVING ENVIRONMENT: Lives with: lives with their spouse Lives in: House/apartment Stairs: Yes: External: 2 steps; on left going up Has following equipment at home: Quad cane large base, Environmental consultant - 2 wheeled, Hemi walker, Wheelchair (manual), shower chair, and Grab bars  PLOF: Independent and Independent with basic ADLs  PATIENT GOALS: to have better use my arm and hand  OBJECTIVE:   HAND DOMINANCE: Right  ADLs:  Transfers/ambulation related to ADLs: Utilizes [facility] for mobility in the community, 80% in the house but will walk without occasionally Eating: husband cuts foods, utilizing built up handles for self-feeding Grooming: needs assistance with washing hair UB Dressing: needs assistance with fastening bra LB Dressing: Mod-max assist for donning pants due to fear of falling, able to don socks and slip on shoes - unable to tie shoes Toileting: Mod I Bathing: Supervision Tub Shower transfers: Supervision, but able to complete transfers with use of grab bars Equipment: Shower seat with back, Grab bars, and Walk in shower  IADLs: Handwriting:  TBA at next session  MOBILITY STATUS: Needs Assist: utilized [facility] for mobility and Hx of falls  POSTURE COMMENTS:  rounded shoulders and forward  head  ACTIVITY TOLERANCE: Activity tolerance: fatigues with increased mobility and effort  FUNCTIONAL OUTCOME MEASURES: Upper Extremity Functional Scale (UEFS): TBD  UPPER EXTREMITY ROM:    Active ROM Right eval Right 05/31/23  Shoulder flexion 87 93  Shoulder abduction 78 82  Shoulder adduction    Shoulder extension    Shoulder internal rotation 90% 100%  Shoulder external rotation 50% 75%  Elbow flexion 132 137  Elbow extension -17 -12  Wrist flexion 19 40  Wrist extension 35 35  Wrist ulnar deviation 15 10  Wrist radial deviation 13 15  Wrist pronation WNL   Wrist supination 20 from neutral 65*  (Blank rows = not tested)  UPPER EXTREMITY MMT:     MMT Right eval Left eval  Shoulder flexion    Shoulder abduction    Shoulder adduction    Shoulder extension    Shoulder internal rotation    Shoulder external rotation    Middle trapezius    Lower trapezius    Elbow flexion    Elbow extension    Wrist flexion    Wrist extension    Wrist ulnar deviation    Wrist radial deviation    Wrist pronation  Wrist supination    (Blank rows = not tested)    Active ROM Extension (as flexion to loose gross grasp) Right eval Right 05/31/23  Thumb MCP (0-60)    Thumb IP (0-80)    Thumb Radial abd/add (0-55)    Thumb Palmar abd/add (0-45)    Thumb opposition to index    Index MCP (0-90) 0   Index PIP (0-100) -30 -50  Index DIP (0-70) -40 -35  Long MCP (0-90) -5 0  Long PIP (0-100) -45 -35  Long DIP (0-70) -5 -25  Ring MCP (0-90_ 0 0  Ring PIP (0-100) -40 -40  Ring DIP (0-70) -20 -20  Little MCP (0-90) -7 0  Little PIP (0-100) -25 -25  Little DIP (0-70) -5 0  (Blank rows = not tested)    HAND FUNCTION: 05/31/23 Grip strength: Right: 9#  Grip strength: Right: 7,9 = 8# lbs; Left: 30 lbs  COORDINATION: 05/31/23 9 Hole Peg test: Right: able to pick up one peg with thumb and long finger, however unable to rotate to place into peg board Box and Blocks:  Right  15 blocks  Finger Nose Finger test: slow and deliberate with RUE, but able to touch both nose and finger at this time 9 Hole Peg test: Right: unable to pick up any pegs with RUE  Box and Blocks:  Right 16 blocks, Left 55 blocks  SENSATION: Light touch: WFL Hot/Cold: WFL  EDEMA: no swelling on eval  MUSCLE TONE: RUE: Mild and Hypertonic  COGNITION: Overall cognitive status: Within functional limits for tasks assessed  VISION: Subjective report: pt reports wearing glasses more since CVA Baseline vision: Wears glasses all the time, double vision after cataract removal Visual history: cataracts  OBSERVATIONS: Pt attempting grasp and pinch activities, frequently not incorporating index finger on R with increased flexion away from task.   TODAY'S TREATMENT:                                                           06/05/23 Vivistim: engaged in lengthy conversation about Vivistim, what it is, who qualifies, and therapy requirements.  Pt with questions/concerns about surgery and if insurance will cover the procedure and surgery.  OT showed pt vivistim website with testimonials and provided with information handout.  OT encouraged pt to read literature and ask her MD as well as PM&R for additional information. Shoulder ROM: engaged in shoulder flexion, chest press, and horizontal abduction in supine with use of dowel to facilitate increased ROM.  Pt demonstrating increased shoulder flexion and abduction with dowel.  Engaged in shoulder internal/external rotation in abduction with pt demonstrating tightness and decreased ROM with external rotation.  OT encouraged pt to hold position ~10 seconds for additional stretch.    05/31/23 Measurements (see above): noted mild improvements in shoulder and elbow ROM, however decreased ROM in wrist and digits, most notably index finger. 9 hole peg test: pt able to pick up peg with thumb and long finger, place pegs in pegboard when positioned into thumb and  index finger by L hand.  Pt able to achieve tip to tip pinch to remove pegs.  Pt wanting to try agai to facilitate increased carryover with picking up and rotation of pegs.  Attempted rotation with tip to tip and use of long finger and/or  use of index finger to rotate peg. OT educated on attempts at rotation with pen, checker, and coin - coin to flat and pen too long.  Attempted with use of checker for increased diameter with ability to pick up, however pt frustrated with decreased ability to complete and stated "I'm done" UE HEP: OT instructed in wrist flexion/extension with PROM to facilitate increased stretch and ROM. Instructed in ulnar/radial deviation with arm positioned on table top to facilitate increased ROM.  Reviewed flexion and extension of fingers with blocking.    05/29/23 Forced use: engaged in stacking/unstacking blocks with focus on incorporation of R index finger and shoulder flexion with increased heigh of block stack.  Pt able to stack up to 5 blocks with use of index finger and thumb with focus on flexion and extension.  Pt requiring increased time to obtain grasp and release, knocking over 20% of time.  Transitioned to placing rings onto vertical dowel to further facilitate sustained grasp with shoulder flexion.  Pt continues to be limited in shoulder flexion with functional reach, ~80-90* shoulder flexion. UE ROM: OT instructed in wrist flexion/extension as well as ulnar/radial deviation.  OT completing PROM in ulnar and radial deviation with noted increased tone in both directions.  OT educated pt on importance of adding ulnar and radial deviation to wrist flexion/extension exercises and carryover of movement into functional tasks (turning on water faucet). Kinesiotape: removed previous kinesiotape due to pealing and applied kinesiotape to R hand and digits in retrograde fashion to facilitate increased passive extension while still allowing pt to engage in grasp and release  exercises.    PATIENT EDUCATION: Education details: ongoing condition specific education. Person educated: Patient and Spouse Education method: Explanation, Demonstration, and Verbal cues Education comprehension: verbalized understanding and needs further education  HOME EXERCISE PROGRAM: Access Code: [email or web address] URL: [email or web address] Date: 04/23/2023 Prepared by: [facility] - Outpatient  Rehab - [facility]    GOALS: Goals reviewed with patient? Yes  SHORT TERM GOALS: Target date: 05/18/23  Pt and spouse will be independent with HEP for ROM, strengthening, and coordination.  Baseline: Goal status:MET  2.  Pt will verbalize understanding of task modifications and/or potential AE needs to increase ease, safety, and independence w/ ADLs.  Baseline: Goal status: IN PROGRESS  LONG TERM GOALS: Target date: 06/15/23  Pt will demonstrate improved functional grasp in RUE by 6# to open new jar, increased handwriting, and self-feeding.  Baseline: R: 8#, L: 30# Goal status: IN PROGRESS  2.  Pt will demonstrate improved UE functional use for ADLs as evidenced by increasing box/ blocks score by 6 blocks with RUE. Baseline: R: 16 blocks, L: 55 blocks Goal status: IN PROGRESS  3.  Pt will demonstrate improved shoulder ROM (shoulder flexion and external rotation) as needed for ADLs/IADLs (washing hair, putting on jacket, etc).  Baseline:  Goal status: IN PROGRESS  4.  Pt will demonstrate improvement of finger extension by 15* TAM of index and long ringer finger to allow for more functional hand opening.  Baseline: -70 IF, -55 LF Goal status: IN PROGRESS  5.  Pt will demonstrate improved handwriting with ability to write name and simple grocery list with 90% legibility. Baseline:  Goal status: IN PROGRESS  6.  Pt will demonstrate improvements in functional use of dominant RUE as evidenced by improvements on FOTO. Baseline: TBD Goal status: IN  PROGRESS  ASSESSMENT:  CLINICAL IMPRESSION: Pt demonstrating ability to maintain grasp with R hand on dowel during shoulder flexion, abduction,  and chest press.  Pt with decreased external rotation and reports of tightness with attempts, but able to tolerate ROM.   Pt may benefit from Vivistim assessment, however pt with questions and concerns about undergoing surgery.  OT to continue to answer questions as able and provide support to foster continued recovery as able.  PERFORMANCE DEFICITS: in functional skills including ADLs, IADLs, coordination, dexterity, tone, ROM, strength, pain, flexibility, Fine motor control, Gross motor control, mobility, balance, body mechanics, endurance, decreased knowledge of precautions, decreased knowledge of use of DME, and UE functional use and psychosocial skills including environmental adaptation and routines and behaviors.   IMPAIRMENTS: are limiting patient from ADLs and IADLs.   CO-MORBIDITIES: may have co-morbidities  that affects occupational performance. Patient will benefit from skilled OT to address above impairments and improve overall function.  MODIFICATION OR ASSISTANCE TO COMPLETE EVALUATION: Min-Moderate modification of tasks or assist with assess necessary to complete an evaluation.  OT OCCUPATIONAL PROFILE AND HISTORY: Detailed assessment: Review of records and additional review of physical, cognitive, psychosocial history related to current functional performance.  CLINICAL DECISION MAKING: Moderate - several treatment options, min-mod task modification necessary  REHAB POTENTIAL: Good  EVALUATION COMPLEXITY: Moderate    PLAN:  OT FREQUENCY: 2x/week  OT DURATION: 8 weeks  PLANNED INTERVENTIONS: self care/ADL training, therapeutic exercise, therapeutic activity, neuromuscular re-education, manual therapy, passive range of motion, functional mobility training, splinting, electrical stimulation, ultrasound, compression bandaging, moist  heat, cryotherapy, patient/family education, psychosocial skills training, energy conservation, coping strategies training, and DME and/or AE instructions  RECOMMENDED OTHER SERVICES: NA  CONSULTED AND AGREED WITH PLAN OF CARE: Patient and family member/caregiver  PLAN FOR NEXT SESSION: initiate gross grasp tasks, further address wrist flexion/extension and ulnar/radial deviation, supination, and finger flexion and extension.  Grasp with large blocks and/or resistance gripper and/or putty?     Rosalio Loud, OTR/L 06/05/2023, 3:25 PM

## 2023-06-05 NOTE — Patient Instructions (Signed)
I had a long d/w patient about his recent stroke, risk for recurrent stroke/TIAs, personally independently reviewed imaging studies and stroke evaluation results and answered questions.Continue clopidogrel 75 mg daily alone now and discontinue aspirin for secondary stroke prevention and maintain strict control of hypertension with blood pressure goal below 130/90, diabetes with hemoglobin A1c goal below 6.5% and lipids with LDL cholesterol goal below 70 mg/dL. I also advised the patient to eat a healthy diet with plenty of whole grains, cereals, fruits and vegetables, exercise regularly and maintain ideal body weight .continue ongoing physical and occupational therapy and ambulate using a cane at all times.  We also discussed fall safety precautions.  Check screening carotid ultrasound study.  Refer to Dr. Wynn Banker for evaluation for ViVistim program to improve right hand range of motion followup in the future with me in 6 months or call earlier if necessary.  Stroke Prevention Some medical conditions and behaviors can lead to a higher chance of having a stroke. You can help prevent a stroke by eating healthy, exercising, not smoking, and managing any medical conditions you have. Stroke is a leading cause of functional impairment. Primary prevention is particularly important because a majority of strokes are first-time events. Stroke changes the lives of not only those who experience a stroke but also their family and other caregivers. How can this condition affect me? A stroke is a medical emergency and should be treated right away. A stroke can lead to brain damage and can sometimes be life-threatening. If a person gets medical treatment right away, there is a better chance of surviving and recovering from a stroke. What can increase my risk? The following medical conditions may increase your risk of a stroke: Cardiovascular disease. High blood pressure (hypertension). Diabetes. High  cholesterol. Sickle cell disease. Blood clotting disorders (hypercoagulable state). Obesity. Sleep disorders (obstructive sleep apnea). Other risk factors include: Being older than age 23. Having a history of blood clots, stroke, or mini-stroke (transient ischemic attack, TIA). Genetic factors, such as race, ethnicity, or a family history of stroke. Smoking cigarettes or using other tobacco products. Taking birth control pills, especially if you also use tobacco. Heavy use of alcohol or drugs, especially cocaine and methamphetamine. Physical inactivity. What actions can I take to prevent this? Manage your health conditions High cholesterol levels. Eating a healthy diet is important for preventing high cholesterol. If cholesterol cannot be managed through diet alone, you may need to take medicines. Take any prescribed medicines to control your cholesterol as told by your health care provider. Hypertension. To reduce your risk of stroke, try to keep your blood pressure below 130/80. Eating a healthy diet and exercising regularly are important for controlling blood pressure. If these steps are not enough to manage your blood pressure, you may need to take medicines. Take any prescribed medicines to control hypertension as told by your health care provider. Ask your health care provider if you should monitor your blood pressure at home. Have your blood pressure checked every year, even if your blood pressure is normal. Blood pressure increases with age and some medical conditions. Diabetes. Eating a healthy diet and exercising regularly are important parts of managing your blood sugar (glucose). If your blood sugar cannot be managed through diet and exercise, you may need to take medicines. Take any prescribed medicines to control your diabetes as told by your health care provider. Get evaluated for obstructive sleep apnea. Talk to your health care provider about getting a sleep evaluation if  you  snore a lot or have excessive sleepiness. Make sure that any other medical conditions you have, such as atrial fibrillation or atherosclerosis, are managed. Nutrition Follow instructions from your health care provider about what to eat or drink to help manage your health condition. These instructions may include: Reducing your daily calorie intake. Limiting how much salt (sodium) you use to 1,500 milligrams (mg) each day. Using only healthy fats for cooking, such as olive oil, canola oil, or sunflower oil. Eating healthy foods. You can do this by: Choosing foods that are high in fiber, such as whole grains, and fresh fruits and vegetables. Eating at least 5 servings of fruits and vegetables a day. Try to fill one-half of your plate with fruits and vegetables at each meal. Choosing lean protein foods, such as lean cuts of meat, poultry without skin, fish, tofu, beans, and nuts. Eating low-fat dairy products. Avoiding foods that are high in sodium. This can help lower blood pressure. Avoiding foods that have saturated fat, trans fat, and cholesterol. This can help prevent high cholesterol. Avoiding processed and prepared foods. Counting your daily carbohydrate intake.  Lifestyle If you drink alcohol: Limit how much you have to: 0-1 drink a day for women who are not pregnant. 0-2 drinks a day for men. Know how much alcohol is in your drink. In the [place] one drink equals one 12 oz bottle of beer ( ), one 5 oz glass of wine ( ), or one 1 oz glass of hard liquor (44mL). Do not use any products that contain nicotine or tobacco. These products include cigarettes, chewing tobacco, and vaping devices, such as e-cigarettes. If you need help quitting, ask your health care provider. Avoid secondhand smoke. Do not use drugs. Activity  Try to stay at a healthy weight. Get at least 30 minutes of exercise on most days, such as: Fast walking. Biking. Swimming. Medicines Take  over-the-counter and prescription medicines only as told by your health care provider. Aspirin or blood thinners (antiplatelets or anticoagulants) may be recommended to reduce your risk of forming blood clots that can lead to stroke. Avoid taking birth control pills. Talk to your health care provider about the risks of taking birth control pills if: You are over 58 years old. You smoke. You get very bad headaches. You have had a blood clot. Where to find more information American Stroke Association: www.strokeassociation.org Get help right away if: You or a loved one has any symptoms of a stroke. "BE FAST" is an easy way to remember the main warning signs of a stroke: B - Balance. Signs are dizziness, sudden trouble walking, or loss of balance. E - Eyes. Signs are trouble seeing or a sudden change in vision. F - Face. Signs are sudden weakness or numbness of the face, or the face or eyelid drooping on one side. A - Arms. Signs are weakness or numbness in an arm. This happens suddenly and usually on one side of the body. S - Speech. Signs are sudden trouble speaking, slurred speech, or trouble understanding what people say. T - Time. Time to call emergency services. Write down what time symptoms started. You or a loved one has other signs of a stroke, such as: A sudden, severe headache with no known cause. Nausea or vomiting. Seizure. These symptoms may represent a serious problem that is an emergency. Do not wait to see if the symptoms will go away. Get medical help right away. Call your local emergency services (911 in the [place] Do not drive  yourself to the hospital. Summary You can help to prevent a stroke by eating healthy, exercising, not smoking, limiting alcohol intake, and managing any medical conditions you may have. Do not use any products that contain nicotine or tobacco. These include cigarettes, chewing tobacco, and vaping devices, such as e-cigarettes. If you need help quitting,  ask your health care provider. Remember "BE FAST" for warning signs of a stroke. Get help right away if you or a loved one has any of these signs. This information is not intended to replace advice given to you by your health care provider. Make sure you discuss any questions you have with your health care provider. Document Revised: 08/21/2022 Document Reviewed: 08/21/2022 Elsevier Patient Education  2024 [employer].

## 2023-06-06 ENCOUNTER — Ambulatory Visit: Payer: Medicare PPO | Attending: Family Medicine

## 2023-06-06 DIAGNOSIS — R2689 Other abnormalities of gait and mobility: Secondary | ICD-10-CM | POA: Insufficient documentation

## 2023-06-06 DIAGNOSIS — R293 Abnormal posture: Secondary | ICD-10-CM | POA: Diagnosis not present

## 2023-06-06 DIAGNOSIS — R278 Other lack of coordination: Secondary | ICD-10-CM | POA: Diagnosis not present

## 2023-06-06 DIAGNOSIS — R2681 Unsteadiness on feet: Secondary | ICD-10-CM | POA: Insufficient documentation

## 2023-06-06 DIAGNOSIS — M6281 Muscle weakness (generalized): Secondary | ICD-10-CM | POA: Insufficient documentation

## 2023-06-06 DIAGNOSIS — I69351 Hemiplegia and hemiparesis following cerebral infarction affecting right dominant side: Secondary | ICD-10-CM | POA: Insufficient documentation

## 2023-06-06 NOTE — Therapy (Signed)
OUTPATIENT PHYSICAL THERAPY TREATMENT NOTE   Patient Name: Angela Buchanan MRN: [id number] DOB:1951-07-18, 72 y.o., female Today's Date: 06/06/2023  PCP:  Garth Bigness, MD   REFERRING PROVIDER:  Garth Bigness, MD   Progress Note Reporting Period 04/04/23 to 06/06/23  See note below for Objective Data and Assessment of Progress/Goals.       END OF SESSION:   PT End of Session - 06/06/23 1447     Visit Number 40    Date for PT Re-Evaluation 07/20/23    Authorization Type [employer]    Authorization Time Period 8 visits 8/22-10/18/24    Progress Note Due on Visit 50    PT Start Time 1402    PT Stop Time 1445    PT Time Calculation (min) 43 min    Activity Tolerance Patient tolerated treatment well    Behavior During Therapy [facility] for tasks assessed/performed                       Past Medical History:  Diagnosis Date   Atypical chest pain 07/19/2021   Bigeminy    no current med.   Breast cancer (HCC) 06/2017   right   Bruises easily    CAD in native artery 10/31/2022   Carotid bruit 11/13/2019   Dental crowns present    History of diverticulitis    Hypertension    states under control with med., has been on med. x 5 yr.   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   Past Surgical History:  Procedure Laterality Date   ABDOMINAL HYSTERECTOMY     partial   BREAST LUMPECTOMY Right 06/22/2017   Malignant   BREAST LUMPECTOMY WITH RADIOACTIVE SEED AND SENTINEL LYMPH NODE BIOPSY Right 06/22/2017   Procedure: RIGHT BREAST LUMPECTOMY WITH RADIOACTIVE SEED AND RIGHT AXILLARY DEEP SENTINEL LYMPH NODE BIOPSY WITH BLUE DYE INJECTION;  Surgeon: Claud Kelp, MD;  Location: [facility];   Service: General;  Laterality: Right;   BREAST LUMPECTOMY WITH RADIOACTIVE SEED LOCALIZATION Left 06/22/2017   Procedure: LEFT BREAST LUMPECTOMY WITH RADIOACTIVE SEED LOCALIZATION;  Surgeon: Claud Kelp, MD;  Location: [facility];  Service: General;  Laterality: Left;   CATARACT EXTRACTION W/ INTRAOCULAR LENS  IMPLANT, BILATERAL Bilateral    EXCISION OF BREAST BIOPSY Left 06/22/2017   benign   LOOP RECORDER INSERTION N/A 05/16/2022   Procedure: LOOP RECORDER INSERTION;  Surgeon: Lanier Prude, MD;  Location: [facility];  Service: Cardiovascular;  Laterality: N/A;   PERCUTANEOUS PINNING Right 06/22/2022   Procedure: PERCUTANEOUS PINNING OF RIGHT HIP;  Surgeon: Bjorn Pippin, MD;  Location: [facility];  Service: Orthopedics;  Laterality: Right;   TONSILLECTOMY     age 37   Patient Active Problem List   Diagnosis Date Noted   Right spastic hemiplegia (HCC) 12/14/2022   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   CAD in native artery 10/31/2022   Malnutrition of moderate degree 06/22/2022   Closed fracture of femur, neck (HCC) 06/21/2022   Urinary frequency 06/21/2022   Hypokalemia 06/21/2022   Heart murmur 06/21/2022   Hypothyroidism 06/21/2022   Dysphagia 05/17/2022   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   CVA (cerebral vascular accident) (HCC) 05/12/2022   Osteoporosis  01/24/2022   Atypical chest pain 07/19/2021   Temporomandibular joint (TMJ) pain 07/16/2020   Referred otalgia of both ears 07/16/2020   PAC (premature atrial contraction) 05/11/2020   PVC (premature ventricular contraction) 05/11/2020   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   Carotid bruit 11/13/2019   Pure hypercholesterolemia 11/13/2019   Malignant neoplasm of upper-outer quadrant of right breast in female, estrogen receptor positive (HCC) 05/22/2017   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   Diplopia 09/17/2014   Dizziness and giddiness 09/17/2014   Essential hypertension  07/22/2014   Chest pain 07/22/2014   Family history of heart disease 07/22/2014    REFERRING DIAG:  R29.6 (ICD-10-CM) - Repeated falls, closed fracture of Rt hip    THERAPY DIAG:  Muscle weakness (generalized)  Unsteadiness on feet  Other abnormalities of gait and mobility  Abnormal posture  Rationale for Evaluation and Treatment Rehabilitation  PERTINENT HISTORY: hypertension, breast cancer, hyperlipidemia, Lt hip fracture secondary to fall 06/2022, CVA 06/2022   PRECAUTIONS:  Fall, Rt breast cancer with node removal, osteoporosis    SUBJECTIVE:                                                                                                                                                                                      SUBJECTIVE STATEMENT:  I saw the neurologist yesterday for the first time since the stroke.  He suggested Vivistim for Rt hand function.   Pt spouse present during visit.   PAIN:  PAIN:  Are you having pain? Yes NPRS scale: 0/10 Pain location: Right shoulder PAIN TYPE: aching Aggravating factors: use Relieving factors: Medication    OBJECTIVE: (objective measures completed at initial evaluation unless otherwise dated)  DIAGNOSTIC FINDINGS: Rt hip fracture and ORIF    COGNITION: Overall cognitive status: Within functional limits for tasks assessed                         SENSATION: WFL  POSTURE: rounded shoulders, forward head, flexed trunk , and weight shift left   PALPATION: NA   LOWER EXTREMITY ROM: Rt hip limited by 50%, hamstrings limited by 50% bil.    LOWER EXTREMITY MMT:   MMT Right eval Right  09/27/22 Left eval Right 10/30/22 Right  01/17/23 Right 05/23/23 Left 10/30/22 Left 01/17/23  Hip flexion 3+ 4- 4- 4- 4 4 4- 4+  Hip extension            Hip abduction 3-  4     4  Hip adduction            Hip internal rotation  Hip external rotation            Knee flexion 3- 4- 3+  4 4  4+  Knee extension 3+ 4 4- 4  painful hip 4+ 4+ 4- 4+  Ankle dorsiflexion 3+ 4- 4- 4-   4   Ankle plantarflexion            Ankle inversion            Ankle eversion             (Blank rows = not tested)   FUNCTIONAL TESTS:   Eval: Timed up and go (TUG): 1 min, 27 seconds   09/27/22: 5x sit to stand: 29 seconds with use of Lt hand  TUG: 27 seconds   10/30/22: 5x sit to stand: 23 sec use of LT hand     TUG: 26 sec with standard cane   11/20/22: 5x sit to stand: 18.68 seconds    TUG: 19.45 without device    3 minute walk test: 225 feet with a standard cane   01/10/23: 5x sit to stand: 15.77 seconds   01/17/23: TUG with cane: 15.41 seconds with cane    3 min walk test: 313 feet with cane    03/07/23: 3 min walk 275 feet with single point cane   TUG: 16 seconds   03/14/23: 6 min walk test: 723 feet with standard cane    TUG: 14.16 without cane    5x sit to stand: 12.15 seconds without hand   03/28/23 TUG 11 seconds without cane  5x STS 10.6 seconds no Ues  04/18/2023: 6 min walk test:  532 ft with [facility]  05/09/23: 6 min walk test with SPC: 667 feet - RPE 5  05/23/23: TUG: 11 seconds without cane 5x sit to stand: 12.71 seconds  6 min walk test: 763 feet  06/06/23:  5x sit to stand: 11.98 seconds        TODAY'S TREATMENT:   DATE:  06/06/2023 Nustep level 3 x10 minutes with LE only. >55 spm PT present throughout to discuss status Farmers carry 6# Lt UE from gym mat table to mirror 2 laps Alternating step tap on 6" step 2x10 with CGA Leg Press (seat at 5) 55# 2x10, single leg: Lt 35#, Rt 15# 2x10 bil each Weight shifting to look behind to a spot on the wall- improved weight shift today Sit to stand: 6# 2x10- did well with increased weight Step tap and step to with colored discs in a circle  DATE:  05/30/2023 Nustep level 3 x8 minutes with LE only. >55 spm PT present throughout to discuss status Farmers carry 6# Lt UE from gym mat table to mirror 2 laps Alternating step tap on 6" step 2x10 with CGA Leg Press  (seat at 5) 55# 2x10, single leg: Lt 35#, Rt 15# 2x10 bil each Weight shifting to look behind to a spot on the wall Sit to stand: 5# 2x10 Step tap and step to with colored discs in a circle  DATE:  05/23/2023 Nustep level 3 x8 minutes with LE only. >55 spm PT present throughout to discuss status 6 min walk test, TUG and 5x sit to stand- see above Farmers carry 6# Lt UE from gym mat table to mirror 2 laps Alternating step tap on 6" step 2x10 with CGA Leg Press (seat at 5) 55# 2x10, single leg: Lt 35#, Rt 15# 2x10 bil each Hurdles: step to with CGA by PT on gait belt Sit to stand:  5# 2x10   PATIENT EDUCATION:  Education details: Access Code: [email or web address] Person educated: Patient Education method: Explanation, Demonstration, and Handouts Education comprehension: verbalized understanding and returned demonstration   HOME EXERCISE PROGRAM: Access Code: [email or web address] URL: [email or web address] Date: 09/27/2022 Prepared by: Tresa Endo  Exercises - Seated Long Arc Quad  - 3 x daily - 7 x weekly - 2 sets - 10 reps - 5 hold - Seated March   - 3 x daily - 7 x weekly - 3 sets - 10 reps - Seated Heel Toe Raises   - 3 x daily - 7 x weekly - 2 sets - 10 reps - Seated Isometric Hip Adduction with Ball  - 3 x daily - 7 x weekly - 2 sets - 10 reps - Sit to Stand with Armchair  - 2 x daily - 7 x weekly - 2 sets - 5-10 reps - Standing Hip Abduction with Counter Support  - 1 x daily - 7 x weekly - 2 sets - 10 reps - Heel Raises with Counter Support  - 2 x daily - 7 x weekly - 2 sets - 10 reps  Patient Education - Walking with a Single [employer] - Modified 4 Point Gait Pattern  ASSESSMENT:   CLINICAL IMPRESSION:     Pt is making steady progress and demonstrates improved independence and stability overall.  Functional measures are improved overall, especially 5x sit to stand today and pt requires less guarding from PT with all exercise in the clinic. Pt continues to report difficulty with turning  look at something behind her and we practiced again today. She had improved weight shift in both directions today and was able to add in arm reach to add to the challenge.   She required stand by assistance with stepping to colored discs in a circle.  Pt with mobility deficits due to CVA followed by hip fracture requiring ORIF which has made progress slow.    Patient will benefit from skilled PT to address the below impairments and improve overall function.   OBJECTIVE IMPAIRMENTS: Abnormal gait, decreased activity tolerance, decreased balance, decreased mobility, difficulty walking, decreased strength, decreased safety awareness, impaired perceived functional ability, impaired flexibility, postural dysfunction, and pain.    ACTIVITY LIMITATIONS: carrying, lifting, sitting, standing, stairs, transfers, hygiene/grooming, and locomotion level   PARTICIPATION LIMITATIONS: meal prep, cleaning, laundry, driving, shopping, and community activity   PERSONAL FACTORS: Age, Past/current experiences, and 1-2 comorbidities: CVA, falls, Rt hip fracture with ORIF  are also affecting patient's functional outcome.    REHAB POTENTIAL: Good   CLINICAL DECISION MAKING: Evolving/moderate complexity   EVALUATION COMPLEXITY: Moderate     GOALS: Goals reviewed with patient? Yes   SHORT TERM GOALS: Target date: 09/06/2022   Be independent in initial HEP Baseline: Goal status: Goal met 08/14/22   2.  Improve LE strength to perform sit to stand with moderate Lt UE support Baseline:  Goal status: Goal met 08/28/22  3.  Perform TUG in < or = to 60 seconds to reduce falls risk Baseline: 27 seconds (09/27/22) Goal status: MET      LONG TERM GOALS: Target date:  07/20/23   Be independent in advanced HEP Baseline: independent in current HEP and has tolerated progress when appropriate (05/23/23)  Goal status: in progress    2.  Perform TUG in < or = to 13 seconds to reduce falls risk Baseline: 11 seconds  without cane (05/23/23) Goal status: met   3.  ambulate > or = to  875 feet in 6 minutes to improve community ambulation and independence  Baseline: 763 (05/23/23) Goal status: In progress      4.  perform 5x sit to stand in < or = to 12 seconds to reduce falls risk  Baseline: 11.98 seconds (06/06/23)  Goal Status: In progress   8. Improve balance to walk her dog short distances with independence   Baseline: walking driveway with dog (05/23/23)  Goal status: in progress     PLAN:   PT FREQUENCY: 1x/week   PT DURATION: 10 weeks   PLANNED INTERVENTIONS: Therapeutic exercises, Therapeutic activity, Neuromuscular re-education, Balance training, Gait training, Patient/Family education, Self Care, Joint mobilization, Stair training, Dry Needling, Electrical stimulation, Cryotherapy, Moist heat, Taping, Manual therapy, and Re-evaluation   PLAN FOR NEXT SESSION: Continue to work on gait , strength and balance. Endurance, change of direction.     Lorrene Reid, PT 06/06/23 2:48 PM    [facility] [address] [place], [place] [address] Phone # [phone] Fax [phone]

## 2023-06-07 ENCOUNTER — Encounter: Payer: Medicare PPO | Admitting: Physical Medicine & Rehabilitation

## 2023-06-07 ENCOUNTER — Ambulatory Visit: Payer: Medicare PPO | Admitting: Occupational Therapy

## 2023-06-07 DIAGNOSIS — M79601 Pain in right arm: Secondary | ICD-10-CM | POA: Diagnosis not present

## 2023-06-07 DIAGNOSIS — M6281 Muscle weakness (generalized): Secondary | ICD-10-CM | POA: Diagnosis not present

## 2023-06-07 DIAGNOSIS — R278 Other lack of coordination: Secondary | ICD-10-CM

## 2023-06-07 DIAGNOSIS — R2689 Other abnormalities of gait and mobility: Secondary | ICD-10-CM | POA: Diagnosis not present

## 2023-06-07 DIAGNOSIS — M25611 Stiffness of right shoulder, not elsewhere classified: Secondary | ICD-10-CM | POA: Diagnosis not present

## 2023-06-07 DIAGNOSIS — I69351 Hemiplegia and hemiparesis following cerebral infarction affecting right dominant side: Secondary | ICD-10-CM | POA: Diagnosis not present

## 2023-06-07 NOTE — Therapy (Signed)
OUTPATIENT OCCUPATIONAL THERAPY NEURO  Treatment Note  Patient Name: Angela Buchanan MRN: [id number] DOB:1950-12-19, 72 y.o., female Today's Date: 06/07/2023  PCP: Shon Hale, MD REFERRING PROVIDER: Shon Hale, MD     END OF SESSION:  OT End of Session - 06/07/23 1641     Visit Number 12    Number of Visits 17    Date for OT Re-Evaluation 06/15/23    Authorization Type [employer]    OT Start Time 1452    OT Stop Time 1534    OT Time Calculation (min) 42 min    Behavior During Therapy [facility] for tasks assessed/performed                        Past Medical History:  Diagnosis Date   Atypical chest pain 07/19/2021   Bigeminy    no current med.   Breast cancer (HCC) 06/2017   right   Bruises easily    CAD in native artery 10/31/2022   Carotid bruit 11/13/2019   Dental crowns present    History of diverticulitis    Hypertension    states under control with med., has been on med. x 5 yr.   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   Past Surgical History:  Procedure Laterality Date   ABDOMINAL HYSTERECTOMY     partial   BREAST LUMPECTOMY Right 06/22/2017   Malignant   BREAST LUMPECTOMY WITH RADIOACTIVE SEED AND SENTINEL LYMPH NODE BIOPSY Right 06/22/2017   Procedure: RIGHT BREAST LUMPECTOMY WITH RADIOACTIVE SEED AND RIGHT AXILLARY DEEP SENTINEL LYMPH NODE BIOPSY WITH BLUE DYE INJECTION;  Surgeon: Claud Kelp, MD;  Location: [facility];  Service: General;  Laterality: Right;   BREAST LUMPECTOMY WITH RADIOACTIVE SEED LOCALIZATION Left 06/22/2017   Procedure: LEFT BREAST LUMPECTOMY WITH RADIOACTIVE SEED LOCALIZATION;  Surgeon: Claud Kelp, MD;  Location:   SURGERY CENTER;  Service: General;  Laterality: Left;   CATARACT EXTRACTION W/ INTRAOCULAR LENS  IMPLANT, BILATERAL Bilateral    EXCISION OF BREAST BIOPSY Left 06/22/2017   benign   LOOP RECORDER INSERTION N/A 05/16/2022   Procedure: LOOP RECORDER INSERTION;  Surgeon: Lanier Prude, MD;  Location: [facility];  Service: Cardiovascular;  Laterality: N/A;   PERCUTANEOUS PINNING Right 06/22/2022   Procedure: PERCUTANEOUS PINNING OF RIGHT HIP;  Surgeon: Bjorn Pippin, MD;  Location: [facility];  Service: Orthopedics;  Laterality: Right;   TONSILLECTOMY     age 35   Patient Active Problem List   Diagnosis Date Noted   Right spastic hemiplegia (HCC) 12/14/2022   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 05/11/2020   PVC (premature ventricular contraction) 05/11/2020   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  Carotid bruit 11/13/2019   Pure hypercholesterolemia 11/13/2019   Malignant neoplasm of upper-outer quadrant of right breast in female, estrogen receptor positive (HCC) 05/22/2017   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   Diplopia 09/17/2014   Dizziness and giddiness 09/17/2014   Essential hypertension 07/22/2014   Chest pain 07/22/2014   Family history of heart disease 07/22/2014    ONSET DATE: 05/13/22 (new referral 04/09/23)  REFERRING DIAG: G83.23 (ICD-10-CM) - 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   Pt accompanied by: self and significant other ([name])  PERTINENT HISTORY: hypertension, breast cancer, hyperlipidemia, Lt hip fracture secondary to fall 06/2022, CVA 05/2022    PRECAUTIONS: Fall, Rt breast cancer with node removal, osteoporosis   WEIGHT BEARING RESTRICTIONS: No  PAIN:  Are you having pain? No  FALLS: Has patient fallen in last 6 months? No  LIVING ENVIRONMENT: Lives with: lives with their spouse Lives in: House/apartment Stairs: Yes: External: 2 steps; on left going up Has following equipment at home: Quad cane large base, Environmental consultant - 2 wheeled, Hemi walker, Wheelchair (manual), shower chair, and Grab bars  PLOF: Independent and Independent with basic ADLs  PATIENT GOALS: to have better use my arm and hand  OBJECTIVE:   HAND DOMINANCE: Right  ADLs:  Transfers/ambulation related to ADLs: Utilizes [facility] for mobility in the community, 80% in the house but will walk without occasionally Eating: husband cuts foods, utilizing built up handles for self-feeding Grooming: needs assistance with washing hair UB Dressing: needs assistance with fastening bra LB Dressing: Mod-max assist for donning pants due to fear of falling, able to don socks and slip on shoes - unable to tie shoes Toileting: Mod I Bathing: Supervision Tub Shower transfers: Supervision, but able to complete transfers with use of grab bars Equipment: Shower seat with back, Grab bars, and Walk in shower  IADLs: Handwriting:  TBA at next session  MOBILITY STATUS: Needs Assist: utilized [facility] for mobility and Hx of falls  POSTURE COMMENTS:  rounded  shoulders and forward head  ACTIVITY TOLERANCE: Activity tolerance: fatigues with increased mobility and effort  FUNCTIONAL OUTCOME MEASURES: Upper Extremity Functional Scale (UEFS): TBD  UPPER EXTREMITY ROM:    Active ROM Right eval Right 05/31/23  Shoulder flexion 87 93  Shoulder abduction 78 82  Shoulder adduction    Shoulder extension    Shoulder internal rotation 90% 100%  Shoulder external rotation 50% 75%  Elbow flexion 132 137  Elbow extension -17 -12  Wrist flexion 19 40  Wrist extension 35 35  Wrist ulnar deviation 15 10  Wrist radial deviation 13 15  Wrist pronation WNL   Wrist supination 20 from neutral 65*  (Blank rows = not tested)  UPPER EXTREMITY MMT:     MMT Right eval Left eval  Shoulder flexion    Shoulder abduction    Shoulder adduction    Shoulder extension    Shoulder internal rotation    Shoulder external rotation    Middle trapezius    Lower trapezius    Elbow flexion    Elbow extension    Wrist flexion    Wrist extension    Wrist ulnar deviation  Wrist radial deviation    Wrist pronation    Wrist supination    (Blank rows = not tested)    Active ROM Extension (as flexion to loose gross grasp) Right eval Right 05/31/23  Thumb MCP (0-60)    Thumb IP (0-80)    Thumb Radial abd/add (0-55)    Thumb Palmar abd/add (0-45)    Thumb opposition to index    Index MCP (0-90) 0   Index PIP (0-100) -30 -50  Index DIP (0-70) -40 -35  Long MCP (0-90) -5 0  Long PIP (0-100) -45 -35  Long DIP (0-70) -5 -25  Ring MCP (0-90_ 0 0  Ring PIP (0-100) -40 -40  Ring DIP (0-70) -20 -20  Little MCP (0-90) -7 0  Little PIP (0-100) -25 -25  Little DIP (0-70) -5 0  (Blank rows = not tested)    HAND FUNCTION: 05/31/23 Grip strength: Right: 9#  Grip strength: Right: 7,9 = 8# lbs; Left: 30 lbs  COORDINATION: 05/31/23 9 Hole Peg test: Right: able to pick up one peg with thumb and long finger, however unable to rotate to place into peg  board Box and Blocks:  Right 15 blocks  Finger Nose Finger test: slow and deliberate with RUE, but able to touch both nose and finger at this time 9 Hole Peg test: Right: unable to pick up any pegs with RUE  Box and Blocks:  Right 16 blocks, Left 55 blocks  SENSATION: Light touch: WFL Hot/Cold: WFL  EDEMA: no swelling on eval  MUSCLE TONE: RUE: Mild and Hypertonic  COGNITION: Overall cognitive status: Within functional limits for tasks assessed  VISION: Subjective report: pt reports wearing glasses more since CVA Baseline vision: Wears glasses all the time, double vision after cataract removal Visual history: cataracts  OBSERVATIONS: Pt attempting grasp and pinch activities, frequently not incorporating index finger on R with increased flexion away from task.   TODAY'S TREATMENT:                                                           06/07/23 Functional grasp: engaged in picking up and stacking 1" blocks with R hand with focus on integration of index finger.  Pt able to stack 10 blocks high and then unstack while reaching to facilitate increased elbow extension.  OT providing cues for functional tasks to engage in to facilitate increased reach and elbow extension. NMR: engaged in functional reach and grasp with attempts at grasping bean bag and tossing into basket to facilitate shoulder extension/flexion, elbow extension, and grasp/release.  Pt able to open hand enough to grasp bean bag and then demonstrating good shoulder extension and flexion as well as elbow extension to toss bean bag into basket, opening hand enough to release grasp.  Pt with limited shoulder extension and elbow extension impacting reach and toss, however with repetition pt demonstrating decreased compensatory strategies and increased attention to hand opening and functional reach.   06/05/23 Vivistim: engaged in lengthy conversation about Vivistim, what it is, who qualifies, and therapy requirements.  Pt with  questions/concerns about surgery and if insurance will cover the procedure and surgery.  OT showed pt vivistim website with testimonials and provided with information handout.  OT encouraged pt to read literature and ask her MD as well as PM&R for  additional information. Shoulder ROM: engaged in shoulder flexion, chest press, and horizontal abduction in supine with use of dowel to facilitate increased ROM.  Pt demonstrating increased shoulder flexion and abduction with dowel.  Engaged in shoulder internal/external rotation in abduction with pt demonstrating tightness and decreased ROM with external rotation.  OT encouraged pt to hold position ~10 seconds for additional stretch.    05/31/23 Measurements (see above): noted mild improvements in shoulder and elbow ROM, however decreased ROM in wrist and digits, most notably index finger. 9 hole peg test: pt able to pick up peg with thumb and long finger, place pegs in pegboard when positioned into thumb and index finger by L hand.  Pt able to achieve tip to tip pinch to remove pegs.  Pt wanting to try agai to facilitate increased carryover with picking up and rotation of pegs.  Attempted rotation with tip to tip and use of long finger and/or use of index finger to rotate peg. OT educated on attempts at rotation with pen, checker, and coin - coin to flat and pen too long.  Attempted with use of checker for increased diameter with ability to pick up, however pt frustrated with decreased ability to complete and stated "I'm done" UE HEP: OT instructed in wrist flexion/extension with PROM to facilitate increased stretch and ROM. Instructed in ulnar/radial deviation with arm positioned on table top to facilitate increased ROM.  Reviewed flexion and extension of fingers with blocking.   PATIENT EDUCATION: Education details: ongoing condition specific education. Person educated: Patient and Spouse Education method: Explanation, Demonstration, and Verbal  cues Education comprehension: verbalized understanding and needs further education  HOME EXERCISE PROGRAM: Access Code: [email or web address] URL: [email or web address] Date: 04/23/2023 Prepared by: [facility] - Outpatient  Rehab - [facility]    GOALS: Goals reviewed with patient? Yes  SHORT TERM GOALS: Target date: 05/18/23  Pt and spouse will be independent with HEP for ROM, strengthening, and coordination.  Baseline: Goal status:MET  2.  Pt will verbalize understanding of task modifications and/or potential AE needs to increase ease, safety, and independence w/ ADLs.  Baseline: Goal status: IN PROGRESS  LONG TERM GOALS: Target date: 06/15/23  Pt will demonstrate improved functional grasp in RUE by 6# to open new jar, increased handwriting, and self-feeding.  Baseline: R: 8#, L: 30# Goal status: IN PROGRESS  2.  Pt will demonstrate improved UE functional use for ADLs as evidenced by increasing box/ blocks score by 6 blocks with RUE. Baseline: R: 16 blocks, L: 55 blocks Goal status: IN PROGRESS  3.  Pt will demonstrate improved shoulder ROM (shoulder flexion and external rotation) as needed for ADLs/IADLs (washing hair, putting on jacket, etc).  Baseline:  Goal status: IN PROGRESS  4.  Pt will demonstrate improvement of finger extension by 15* TAM of index and long ringer finger to allow for more functional hand opening.  Baseline: -70 IF, -55 LF Goal status: IN PROGRESS  5.  Pt will demonstrate improved handwriting with ability to write name and simple grocery list with 90% legibility. Baseline:  Goal status: IN PROGRESS  6.  Pt will demonstrate improvements in functional use of dominant RUE as evidenced by improvements on FOTO. Baseline: TBD Goal status: IN PROGRESS  ASSESSMENT:  CLINICAL IMPRESSION: Pt demonstrating increased functional grasp and release with stacking blocks and tossing bean bags this session.  OT providing encouragement and  recommendations for continued functional tasks to facilitate increased reach (shoulder flexion and elbow extension) as well as hand opening/closing.  PERFORMANCE DEFICITS: in functional skills including ADLs, IADLs, coordination, dexterity, tone, ROM, strength, pain, flexibility, Fine motor control, Gross motor control, mobility, balance, body mechanics, endurance, decreased knowledge of precautions, decreased knowledge of use of DME, and UE functional use and psychosocial skills including environmental adaptation and routines and behaviors.   IMPAIRMENTS: are limiting patient from ADLs and IADLs.   CO-MORBIDITIES: may have co-morbidities  that affects occupational performance. Patient will benefit from skilled OT to address above impairments and improve overall function.  MODIFICATION OR ASSISTANCE TO COMPLETE EVALUATION: Min-Moderate modification of tasks or assist with assess necessary to complete an evaluation.  OT OCCUPATIONAL PROFILE AND HISTORY: Detailed assessment: Review of records and additional review of physical, cognitive, psychosocial history related to current functional performance.  CLINICAL DECISION MAKING: Moderate - several treatment options, min-mod task modification necessary  REHAB POTENTIAL: Good  EVALUATION COMPLEXITY: Moderate    PLAN:  OT FREQUENCY: 2x/week  OT DURATION: 8 weeks  PLANNED INTERVENTIONS: self care/ADL training, therapeutic exercise, therapeutic activity, neuromuscular re-education, manual therapy, passive range of motion, functional mobility training, splinting, electrical stimulation, ultrasound, compression bandaging, moist heat, cryotherapy, patient/family education, psychosocial skills training, energy conservation, coping strategies training, and DME and/or AE instructions  RECOMMENDED OTHER SERVICES: NA  CONSULTED AND AGREED WITH PLAN OF CARE: Patient and family member/caregiver  PLAN FOR NEXT SESSION: initiate gross grasp tasks,  further address wrist flexion/extension and ulnar/radial deviation, supination, and finger flexion and extension.  Grasp with large blocks and/or resistance gripper and/or putty?     Rosalio Loud, OTR/L 06/07/2023, 4:42 PM

## 2023-06-08 NOTE — Progress Notes (Signed)
[facility] Summary Report / Loop Recorder 

## 2023-06-11 ENCOUNTER — Ambulatory Visit (HOSPITAL_COMMUNITY)
Admission: RE | Admit: 2023-06-11 | Discharge: 2023-06-11 | Disposition: A | Discharge status: Home / Self Care | Payer: Medicare PPO | Source: Ambulatory Visit | Attending: Neurology | Admitting: Neurology

## 2023-06-11 DIAGNOSIS — G8110 Spastic hemiplegia affecting unspecified side: Secondary | ICD-10-CM | POA: Insufficient documentation

## 2023-06-11 DIAGNOSIS — I639 Cerebral infarction, unspecified: Secondary | ICD-10-CM | POA: Diagnosis not present

## 2023-06-11 NOTE — Progress Notes (Signed)
VASCULAR LAB    Carotid duplex has been performed.  See CV proc for preliminary results.   Yomara Toothman, RVT 06/11/2023, 2:29 PM

## 2023-06-12 ENCOUNTER — Ambulatory Visit: Payer: Medicare PPO | Admitting: Occupational Therapy

## 2023-06-12 DIAGNOSIS — M25611 Stiffness of right shoulder, not elsewhere classified: Secondary | ICD-10-CM

## 2023-06-12 DIAGNOSIS — M6281 Muscle weakness (generalized): Secondary | ICD-10-CM

## 2023-06-12 DIAGNOSIS — I69351 Hemiplegia and hemiparesis following cerebral infarction affecting right dominant side: Secondary | ICD-10-CM

## 2023-06-12 DIAGNOSIS — R278 Other lack of coordination: Secondary | ICD-10-CM

## 2023-06-12 DIAGNOSIS — M79601 Pain in right arm: Secondary | ICD-10-CM | POA: Diagnosis not present

## 2023-06-12 DIAGNOSIS — R2689 Other abnormalities of gait and mobility: Secondary | ICD-10-CM | POA: Diagnosis not present

## 2023-06-12 NOTE — Therapy (Signed)
OUTPATIENT OCCUPATIONAL THERAPY NEURO  Treatment Note  Patient Name: SKARLETTE WIETECHA MRN: [id number] DOB:[birth year], 72 y.o., female Today's Date: 06/12/2023  PCP: Shon Hale, MD REFERRING PROVIDER: Shon Hale, MD     END OF SESSION:  OT End of Session - 06/12/23 1545     Visit Number 13    Number of Visits 17    Date for OT Re-Evaluation 06/15/23    Authorization Type [employer]    OT Start Time 1401    OT Stop Time 1443    OT Time Calculation (min) 42 min    Behavior During Therapy [facility] for tasks assessed/performed                         Past Medical History:  Diagnosis Date   Atypical chest pain 07/19/2021   Bigeminy    no current med.   Breast cancer (HCC) 06/2017   right   Bruises easily    CAD in native artery 10/31/2022   Carotid bruit 11/13/2019   Dental crowns present    History of diverticulitis    Hypertension    states under control with med., has been on med. x 5 yr.   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   Past Surgical History:  Procedure Laterality Date   ABDOMINAL HYSTERECTOMY     partial   BREAST LUMPECTOMY Right 06/22/2017   Malignant   BREAST LUMPECTOMY WITH RADIOACTIVE SEED AND SENTINEL LYMPH NODE BIOPSY Right 06/22/2017   Procedure: RIGHT BREAST LUMPECTOMY WITH RADIOACTIVE SEED AND RIGHT AXILLARY DEEP SENTINEL LYMPH NODE BIOPSY WITH BLUE DYE INJECTION;  Surgeon: Claud Kelp, MD;  Location: [facility];  Service: General;  Laterality: Right;   BREAST LUMPECTOMY WITH RADIOACTIVE SEED LOCALIZATION Left 06/22/2017   Procedure: LEFT BREAST LUMPECTOMY WITH RADIOACTIVE SEED LOCALIZATION;  Surgeon: Claud Kelp, MD;  Location: [facility];  Service: General;  Laterality: Left;   CATARACT EXTRACTION W/ INTRAOCULAR LENS  IMPLANT, BILATERAL Bilateral    EXCISION OF BREAST BIOPSY Left 06/22/2017   benign   LOOP RECORDER INSERTION N/A 05/16/2022   Procedure: LOOP RECORDER INSERTION;  Surgeon: Lanier Prude, MD;  Location: [facility];  Service: Cardiovascular;  Laterality: N/A;   PERCUTANEOUS PINNING Right 06/22/2022   Procedure: PERCUTANEOUS PINNING OF RIGHT HIP;  Surgeon: Bjorn Pippin, MD;  Location: [facility];  Service: Orthopedics;  Laterality: Right;   TONSILLECTOMY     age 82   Patient Active Problem List   Diagnosis Date Noted   Right spastic hemiplegia (HCC) 12/14/2022   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 05/11/2020   PVC (premature ventricular contraction) 05/11/2020   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  Carotid bruit 11/13/2019   Pure hypercholesterolemia 11/13/2019   Malignant neoplasm of upper-outer quadrant of right breast in female, estrogen receptor positive (HCC) 05/22/2017   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   Diplopia 09/17/2014   Dizziness and giddiness 09/17/2014   Essential hypertension 07/22/2014   Chest pain 07/22/2014   Family history of heart disease 07/22/2014    ONSET DATE: 05/13/22 (new referral 04/09/23)  REFERRING DIAG: G83.23 (ICD-10-CM) - 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  THERAPY  DIAG:  Stiffness of right shoulder, not elsewhere classified  Other lack of coordination  Hemiplegia and hemiparesis following cerebral infarction affecting right dominant side (HCC)  Muscle weakness (generalized)  Rationale for Evaluation and Treatment: Rehabilitation  SUBJECTIVE:   SUBJECTIVE STATEMENT: "I got these (bags) at the dollar store and I've been tossing them at a basket" Pt accompanied by: self and significant other ([name])  PERTINENT HISTORY: hypertension, breast cancer, hyperlipidemia, Lt hip fracture secondary to fall 06/2022, CVA 05/2022    PRECAUTIONS: Fall, Rt breast cancer with node removal, osteoporosis   WEIGHT BEARING RESTRICTIONS: No  PAIN:  Are you having pain? No  FALLS: Has patient fallen in last 6 months? No  LIVING ENVIRONMENT: Lives with: lives with their spouse Lives in: House/apartment Stairs: Yes: External: 2 steps; on left going up Has following equipment at home: Quad cane large base, Environmental consultant - 2 wheeled, Hemi walker, Wheelchair (manual), shower chair, and Grab bars  PLOF: Independent and Independent with basic ADLs  PATIENT GOALS: to have better use my arm and hand  OBJECTIVE:   HAND DOMINANCE: Right  ADLs:  Transfers/ambulation related to ADLs: Utilizes [facility] for mobility in the community, 80% in the house but will walk without occasionally Eating: husband cuts foods, utilizing built up handles for self-feeding Grooming: needs assistance with washing hair UB Dressing: needs assistance with fastening bra LB Dressing: Mod-max assist for donning pants due to fear of falling, able to don socks and slip on shoes - unable to tie shoes Toileting: Mod I Bathing: Supervision Tub Shower transfers: Supervision, but able to complete transfers with use of grab bars Equipment: Shower seat with back, Grab bars, and Walk in shower  IADLs: Handwriting:  TBA at next session  MOBILITY STATUS: Needs Assist: utilized [facility] for mobility and Hx of  falls  POSTURE COMMENTS:  rounded shoulders and forward head  ACTIVITY TOLERANCE: Activity tolerance: fatigues with increased mobility and effort  FUNCTIONAL OUTCOME MEASURES: Upper Extremity Functional Scale (UEFS): TBD  UPPER EXTREMITY ROM:    Active ROM Right eval Right 05/31/23  Shoulder flexion 87 93  Shoulder abduction 78 82  Shoulder adduction    Shoulder extension    Shoulder internal rotation 90% 100%  Shoulder external rotation 50% 75%  Elbow flexion 132 137  Elbow extension -17 -12  Wrist flexion 19 40  Wrist extension 35 35  Wrist ulnar deviation 15 10  Wrist radial deviation 13 15  Wrist pronation WNL   Wrist supination 20 from neutral 65*  (Blank rows = not tested)  UPPER EXTREMITY MMT:     MMT Right eval Left eval  Shoulder flexion    Shoulder abduction    Shoulder adduction    Shoulder extension    Shoulder internal rotation    Shoulder external rotation    Middle trapezius    Lower trapezius    Elbow flexion    Elbow extension    Wrist flexion    Wrist extension    Wrist  ulnar deviation    Wrist radial deviation    Wrist pronation    Wrist supination    (Blank rows = not tested)    Active ROM Extension (as flexion to loose gross grasp) Right eval Right 05/31/23  Thumb MCP (0-60)    Thumb IP (0-80)    Thumb Radial abd/add (0-55)    Thumb Palmar abd/add (0-45)    Thumb opposition to index    Index MCP (0-90) 0   Index PIP (0-100) -30 -50  Index DIP (0-70) -40 -35  Long MCP (0-90) -5 0  Long PIP (0-100) -45 -35  Long DIP (0-70) -5 -25  Ring MCP (0-90_ 0 0  Ring PIP (0-100) -40 -40  Ring DIP (0-70) -20 -20  Little MCP (0-90) -7 0  Little PIP (0-100) -25 -25  Little DIP (0-70) -5 0  (Blank rows = not tested)    HAND FUNCTION: 05/31/23 Grip strength: Right: 9#  Grip strength: Right: 7,9 = 8# lbs; Left: 30 lbs  COORDINATION: 05/31/23 9 Hole Peg test: Right: able to pick up one peg with thumb and long finger, however unable  to rotate to place into peg board Box and Blocks:  Right 15 blocks  Finger Nose Finger test: slow and deliberate with RUE, but able to touch both nose and finger at this time 9 Hole Peg test: Right: unable to pick up any pegs with RUE  Box and Blocks:  Right 16 blocks, Left 55 blocks  SENSATION: Light touch: WFL Hot/Cold: WFL  EDEMA: no swelling on eval  MUSCLE TONE: RUE: Mild and Hypertonic  COGNITION: Overall cognitive status: Within functional limits for tasks assessed  VISION: Subjective report: pt reports wearing glasses more since CVA Baseline vision: Wears glasses all the time, double vision after cataract removal Visual history: cataracts  OBSERVATIONS: Pt attempting grasp and pinch activities, frequently not incorporating index finger on R with increased flexion away from task.   TODAY'S TREATMENT:                                                           06/12/23 UE ROM Ball rolling/rotation: engaged in shoulder flexion and elbow extension with med ball, transitioning to rotation to facilitate pronation and supination. Ulnar/radial deviation: engaged in ulnar/radial deviation with 1# dumbbell with focus on sustained grasp and strengthening as needed to turn on water faucet. Coordination Golf balls: picking up and placing golf balls into bowl with focus on functional grasp and reach.  Pt continues to demonstrate decreased index finger incorporation into grasp.  OT encouraging exaggerated hand opening with release of balls. Kinesiotape: applied kinesiotape to R hand and digits in retrograde fashion to facilitate increased passive extension while still allowing pt to engage in grasp and release exercises.    06/07/23 Functional grasp: engaged in picking up and stacking 1" blocks with R hand with focus on integration of index finger.  Pt able to stack 10 blocks high and then unstack while reaching to facilitate increased elbow extension.  OT providing cues for functional  tasks to engage in to facilitate increased reach and elbow extension. NMR: engaged in functional reach and grasp with attempts at grasping bean bag and tossing into basket to facilitate shoulder extension/flexion, elbow extension, and grasp/release.  Pt able to open hand enough to  grasp bean bag and then demonstrating good shoulder extension and flexion as well as elbow extension to toss bean bag into basket, opening hand enough to release grasp.  Pt with limited shoulder extension and elbow extension impacting reach and toss, however with repetition pt demonstrating decreased compensatory strategies and increased attention to hand opening and functional reach.   06/05/23 Vivistim: engaged in lengthy conversation about Vivistim, what it is, who qualifies, and therapy requirements.  Pt with questions/concerns about surgery and if insurance will cover the procedure and surgery.  OT showed pt vivistim website with testimonials and provided with information handout.  OT encouraged pt to read literature and ask her MD as well as PM&R for additional information. Shoulder ROM: engaged in shoulder flexion, chest press, and horizontal abduction in supine with use of dowel to facilitate increased ROM.  Pt demonstrating increased shoulder flexion and abduction with dowel.  Engaged in shoulder internal/external rotation in abduction with pt demonstrating tightness and decreased ROM with external rotation.  OT encouraged pt to hold position ~10 seconds for additional stretch.   PATIENT EDUCATION: Education details: ongoing condition specific education. Person educated: Patient and Spouse Education method: Explanation, Demonstration, and Verbal cues Education comprehension: verbalized understanding and needs further education  HOME EXERCISE PROGRAM: Access Code: [email or web address] URL: [email or web address] Date: 04/23/2023 Prepared by: [facility]    GOALS: Goals  reviewed with patient? Yes  SHORT TERM GOALS: Target date: 05/18/23  Pt and spouse will be independent with HEP for ROM, strengthening, and coordination.  Baseline: Goal status:MET  2.  Pt will verbalize understanding of task modifications and/or potential AE needs to increase ease, safety, and independence w/ ADLs.  Baseline: Goal status: IN PROGRESS  LONG TERM GOALS: Target date: 06/15/23  Pt will demonstrate improved functional grasp in RUE by 6# to open new jar, increased handwriting, and self-feeding.  Baseline: R: 8#, L: 30# Goal status: IN PROGRESS  2.  Pt will demonstrate improved UE functional use for ADLs as evidenced by increasing box/ blocks score by 6 blocks with RUE. Baseline: R: 16 blocks, L: 55 blocks Goal status: IN PROGRESS  3.  Pt will demonstrate improved shoulder ROM (shoulder flexion and external rotation) as needed for ADLs/IADLs (washing hair, putting on jacket, etc).  Baseline:  Goal status: IN PROGRESS  4.  Pt will demonstrate improvement of finger extension by 15* TAM of index and long ringer finger to allow for more functional hand opening.  Baseline: -70 IF, -55 LF Goal status: IN PROGRESS  5.  Pt will demonstrate improved handwriting with ability to write name and simple grocery list with 90% legibility. Baseline:  Goal status: IN PROGRESS  6.  Pt will demonstrate improvements in functional use of dominant RUE as evidenced by improvements on FOTO. Baseline: TBD Goal status: IN PROGRESS  ASSESSMENT:  CLINICAL IMPRESSION: Pt demonstrating increased functional grasp and release with picking up golf balls from a level surface and from a bowl.  Pt continues to demonstrate decreased wrist flexion/extension and ulnar/radial deviation impacting functional grasp and ability to turn on/off water faucet.  Pt demonstrating sustained grasp on 1# dumbbell while completing ulnar/radial deviation with OT providing min tactile cues for positioning to decrease  pronation and shoulder hike with attempts at ulnar/radial deviation.  Engaged in discussion about upcoming end of certification and plans to d/c vs re-cert with decreased # of visits.   PERFORMANCE DEFICITS: in functional skills including ADLs, IADLs, coordination, dexterity, tone, ROM, strength,  pain, flexibility, Fine motor control, Gross motor control, mobility, balance, body mechanics, endurance, decreased knowledge of precautions, decreased knowledge of use of DME, and UE functional use and psychosocial skills including environmental adaptation and routines and behaviors.   IMPAIRMENTS: are limiting patient from ADLs and IADLs.   CO-MORBIDITIES: may have co-morbidities  that affects occupational performance. Patient will benefit from skilled OT to address above impairments and improve overall function.  MODIFICATION OR ASSISTANCE TO COMPLETE EVALUATION: Min-Moderate modification of tasks or assist with assess necessary to complete an evaluation.  OT OCCUPATIONAL PROFILE AND HISTORY: Detailed assessment: Review of records and additional review of physical, cognitive, psychosocial history related to current functional performance.  CLINICAL DECISION MAKING: Moderate - several treatment options, min-mod task modification necessary  REHAB POTENTIAL: Good  EVALUATION COMPLEXITY: Moderate    PLAN:  OT FREQUENCY: 2x/week  OT DURATION: 8 weeks  PLANNED INTERVENTIONS: self care/ADL training, therapeutic exercise, therapeutic activity, neuromuscular re-education, manual therapy, passive range of motion, functional mobility training, splinting, electrical stimulation, ultrasound, compression bandaging, moist heat, cryotherapy, patient/family education, psychosocial skills training, energy conservation, coping strategies training, and DME and/or AE instructions  RECOMMENDED OTHER SERVICES: NA  CONSULTED AND AGREED WITH PLAN OF CARE: Patient and family member/caregiver  PLAN FOR NEXT SESSION:  initiate gross grasp tasks, further address wrist flexion/extension and ulnar/radial deviation, supination, and finger flexion and extension.  Grasp with large blocks and/or resistance gripper and/or putty?     Rosalio Loud, OTR/L 06/12/2023, 3:45 PM

## 2023-06-13 ENCOUNTER — Ambulatory Visit: Payer: Medicare PPO

## 2023-06-14 ENCOUNTER — Ambulatory Visit: Payer: Medicare PPO | Admitting: Occupational Therapy

## 2023-06-14 DIAGNOSIS — M6281 Muscle weakness (generalized): Secondary | ICD-10-CM

## 2023-06-14 DIAGNOSIS — M25611 Stiffness of right shoulder, not elsewhere classified: Secondary | ICD-10-CM

## 2023-06-14 DIAGNOSIS — R278 Other lack of coordination: Secondary | ICD-10-CM | POA: Diagnosis not present

## 2023-06-14 DIAGNOSIS — M79601 Pain in right arm: Secondary | ICD-10-CM | POA: Diagnosis not present

## 2023-06-14 DIAGNOSIS — I69351 Hemiplegia and hemiparesis following cerebral infarction affecting right dominant side: Secondary | ICD-10-CM

## 2023-06-14 DIAGNOSIS — R2689 Other abnormalities of gait and mobility: Secondary | ICD-10-CM | POA: Diagnosis not present

## 2023-06-14 NOTE — Therapy (Signed)
OUTPATIENT OCCUPATIONAL THERAPY NEURO  Treatment Note  Patient Name: Angela Buchanan MRN: [id number] DOB:[birth year], 72 y.o., female Today's Date: 06/14/2023  PCP: Shon Hale, MD REFERRING PROVIDER: Shon Hale, MD     END OF SESSION:  OT End of Session - 06/14/23 1456     Visit Number 14    Number of Visits 26    Date for OT Re-Evaluation 07/27/23    Authorization Type [employer]    OT Start Time 1443    OT Stop Time 1532    OT Time Calculation (min) 49 min    Behavior During Therapy [facility] for tasks assessed/performed                          Past Medical History:  Diagnosis Date   Atypical chest pain 07/19/2021   Bigeminy    no current med.   Breast cancer (HCC) 06/2017   right   Bruises easily    CAD in native artery 10/31/2022   Carotid bruit 11/13/2019   Dental crowns present    History of diverticulitis    Hypertension    states under control with med., has been on med. x 5 yr.   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   Past Surgical History:  Procedure Laterality Date   ABDOMINAL HYSTERECTOMY     partial   BREAST LUMPECTOMY Right 06/22/2017   Malignant   BREAST LUMPECTOMY WITH RADIOACTIVE SEED AND SENTINEL LYMPH NODE BIOPSY Right 06/22/2017   Procedure: RIGHT BREAST LUMPECTOMY WITH RADIOACTIVE SEED AND RIGHT AXILLARY DEEP SENTINEL LYMPH NODE BIOPSY WITH BLUE DYE INJECTION;  Surgeon: Claud Kelp, MD;  Location: [facility];  Service: General;  Laterality: Right;   BREAST LUMPECTOMY WITH RADIOACTIVE SEED LOCALIZATION Left 06/22/2017   Procedure: LEFT BREAST LUMPECTOMY WITH RADIOACTIVE SEED LOCALIZATION;  Surgeon: Claud Kelp, MD;  Location: [facility]  [place];  Service: General;  Laterality: Left;   CATARACT EXTRACTION W/ INTRAOCULAR LENS  IMPLANT, BILATERAL Bilateral    EXCISION OF BREAST BIOPSY Left 06/22/2017   benign   LOOP RECORDER INSERTION N/A 05/16/2022   Procedure: LOOP RECORDER INSERTION;  Surgeon: Lanier Prude, MD;  Location: [facility];  Service: Cardiovascular;  Laterality: N/A;   PERCUTANEOUS PINNING Right 06/22/2022   Procedure: PERCUTANEOUS PINNING OF RIGHT HIP;  Surgeon: Bjorn Pippin, MD;  Location: [facility];  Service: Orthopedics;  Laterality: Right;   TONSILLECTOMY     age 59   Patient Active Problem List   Diagnosis Date Noted   Right spastic hemiplegia (HCC) 12/14/2022   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 05/11/2020   PVC (premature ventricular contraction) 05/11/2020   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  Carotid bruit 11/13/2019   Pure hypercholesterolemia 11/13/2019   Malignant neoplasm of upper-outer quadrant of right breast in female, estrogen receptor positive (HCC) 05/22/2017   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   Diplopia 09/17/2014   Dizziness and giddiness 09/17/2014   Essential hypertension 07/22/2014   Chest pain 07/22/2014   Family history of heart disease 07/22/2014    ONSET DATE: 05/13/22 (new referral 04/09/23)  REFERRING DIAG: G83.23 (ICD-10-CM) - 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  THERAPY  DIAG:  Stiffness of right shoulder, not elsewhere classified  Other lack of coordination  Hemiplegia and hemiparesis following cerebral infarction affecting right dominant side (HCC)  Muscle weakness (generalized)  Rationale for Evaluation and Treatment: Rehabilitation  SUBJECTIVE:   SUBJECTIVE STATEMENT: "I got these (bags) at the dollar store and I've been tossing them at a basket" Pt accompanied by: self and significant other ([name])  PERTINENT HISTORY: hypertension, breast cancer, hyperlipidemia, Lt hip fracture secondary to fall 06/2022, CVA 05/2022    PRECAUTIONS: Fall, Rt breast cancer with node removal, osteoporosis   WEIGHT BEARING RESTRICTIONS: No  PAIN:  Are you having pain? No  FALLS: Has patient fallen in last 6 months? No  LIVING ENVIRONMENT: Lives with: lives with their spouse Lives in: House/apartment Stairs: Yes: External: 2 steps; on left going up Has following equipment at home: Quad cane large base, Environmental consultant - 2 wheeled, Hemi walker, Wheelchair (manual), shower chair, and Grab bars  PLOF: Independent and Independent with basic ADLs  PATIENT GOALS: to have better use my arm and hand  OBJECTIVE:   HAND DOMINANCE: Right  ADLs:  Transfers/ambulation related to ADLs: Utilizes [facility] for mobility in the community, 80% in the house but will walk without occasionally Eating: husband cuts foods, utilizing built up handles for self-feeding Grooming: needs assistance with washing hair UB Dressing: needs assistance with fastening bra LB Dressing: Mod-max assist for donning pants due to fear of falling, able to don socks and slip on shoes - unable to tie shoes Toileting: Mod I Bathing: Supervision Tub Shower transfers: Supervision, but able to complete transfers with use of grab bars Equipment: Shower seat with back, Grab bars, and Walk in shower  IADLs: Handwriting:  TBA at next session  MOBILITY STATUS: Needs Assist: utilized [facility] for mobility and Hx of  falls  POSTURE COMMENTS:  rounded shoulders and forward head  ACTIVITY TOLERANCE: Activity tolerance: fatigues with increased mobility and effort  FUNCTIONAL OUTCOME MEASURES: Upper Extremity Functional Scale (UEFS): TBD  UPPER EXTREMITY ROM:    Active ROM Right eval Right 05/31/23 Right 06/14/23  Shoulder flexion 87 93 98  Shoulder abduction 78 82 86  Shoulder adduction     Shoulder extension     Shoulder internal rotation 90% 100% 100%  Shoulder external rotation 50% 75% 95% (requires increased time)  Elbow flexion 132 137 138  Elbow extension -17 -12 -12  Wrist flexion 19 40 48  Wrist extension 35 35 40  Wrist ulnar deviation 15 10 15   Wrist radial deviation 13 15 15   Wrist pronation WNL  WNL  Wrist supination 20 from neutral 65* 65  (Blank rows = not tested)  UPPER EXTREMITY MMT:     MMT Right eval Left eval  Shoulder flexion    Shoulder abduction    Shoulder adduction    Shoulder extension    Shoulder internal rotation    Shoulder external rotation    Middle trapezius    Lower trapezius    Elbow flexion  Elbow extension    Wrist flexion    Wrist extension    Wrist ulnar deviation    Wrist radial deviation    Wrist pronation    Wrist supination    (Blank rows = not tested)    Active ROM Extension (as flexion to loose gross grasp) Right eval Right 05/31/23 Right 06/14/23  Thumb MCP (0-60)     Thumb IP (0-80)     Thumb Radial abd/add (0-55)     Thumb Palmar abd/add (0-45)     Thumb opposition to index     Index MCP (0-90) 0  0  Index PIP (0-100) -30 -50 -22  Index DIP (0-70) -40 -35 -35  Long MCP (0-90) -5 0 0  Long PIP (0-100) -45 -35 -40  Long DIP (0-70) -5 -25 -15  Ring MCP (0-90_ 0 0   Ring PIP (0-100) -40 -40   Ring DIP (0-70) -20 -20   Little MCP (0-90) -7 0   Little PIP (0-100) -25 -25   Little DIP (0-70) -5 0   (Blank rows = not tested)    HAND FUNCTION: 05/31/23 Grip strength: Right: 9#  Grip strength: Right: 7,9 = 8# lbs;  Left: 30 lbs  COORDINATION: 05/31/23 9 Hole Peg test: Right: able to pick up one peg with thumb and long finger, however unable to rotate to place into peg board Box and Blocks:  Right 15 blocks  Finger Nose Finger test: slow and deliberate with RUE, but able to touch both nose and finger at this time 9 Hole Peg test: Right: unable to pick up any pegs with RUE  Box and Blocks:  Right 16 blocks, Left 55 blocks  SENSATION: Light touch: WFL Hot/Cold: WFL  EDEMA: no swelling on eval  MUSCLE TONE: RUE: Mild and Hypertonic  COGNITION: Overall cognitive status: Within functional limits for tasks assessed  VISION: Subjective report: pt reports wearing glasses more since CVA Baseline vision: Wears glasses all the time, double vision after cataract removal Visual history: cataracts  OBSERVATIONS: Pt attempting grasp and pinch activities, frequently not incorporating index finger on R with increased flexion away from task.   TODAY'S TREATMENT:                                                           06/14/23 Self-care: educated on use of RUE during self-care tasks (ie washing hair, putting on glasses, etc.).  OT reinforced education on use of RUE even with hand over hand during aspects of bathing and dressing to continue to incorporate during functional tasks as pt with increased ROM with shoulder flexion, internal and external rotation.  Reiterated functional reach, providing either target and/or functional task to facilitate increased and consistent ROM. Box and blocks: R: 18 blocks - however pt completing 8 in 20 sec and 11 in 30 sec, therefore really demonstrating decrease due to fatigue with increased shoulder ROM. Handwriting: Pt able to write name with built up handle, still requiring significant effort and decreased legibility.   06/12/23 UE ROM Ball rolling/rotation: engaged in shoulder flexion and elbow extension with med ball, transitioning to rotation to facilitate pronation and  supination. Ulnar/radial deviation: engaged in ulnar/radial deviation with 1# dumbbell with focus on sustained grasp and strengthening as needed to turn on water faucet. Coordination Golf balls:  picking up and placing golf balls into bowl with focus on functional grasp and reach.  Pt continues to demonstrate decreased index finger incorporation into grasp.  OT encouraging exaggerated hand opening with release of balls. Kinesiotape: applied kinesiotape to R hand and digits in retrograde fashion to facilitate increased passive extension while still allowing pt to engage in grasp and release exercises.    06/07/23 Functional grasp: engaged in picking up and stacking 1" blocks with R hand with focus on integration of index finger.  Pt able to stack 10 blocks high and then unstack while reaching to facilitate increased elbow extension.  OT providing cues for functional tasks to engage in to facilitate increased reach and elbow extension. NMR: engaged in functional reach and grasp with attempts at grasping bean bag and tossing into basket to facilitate shoulder extension/flexion, elbow extension, and grasp/release.  Pt able to open hand enough to grasp bean bag and then demonstrating good shoulder extension and flexion as well as elbow extension to toss bean bag into basket, opening hand enough to release grasp.  Pt with limited shoulder extension and elbow extension impacting reach and toss, however with repetition pt demonstrating decreased compensatory strategies and increased attention to hand opening and functional reach.   PATIENT EDUCATION: Education details: ongoing condition specific education. Person educated: Patient and Spouse Education method: Explanation, Demonstration, and Verbal cues Education comprehension: verbalized understanding and needs further education  HOME EXERCISE PROGRAM: Access Code: [email or web address] URL: [email or web address] Date: 04/23/2023 Prepared by: [facility] -  Outpatient  Rehab - [facility]    GOALS: Goals reviewed with patient? Yes  SHORT TERM GOALS: Target date: 05/18/23  Pt and spouse will be independent with HEP for ROM, strengthening, and coordination.  Baseline: Goal status:MET  2.  Pt will verbalize understanding of task modifications and/or potential AE needs to increase ease, safety, and independence w/ ADLs.  Baseline: Goal status: IN PROGRESS  NEW SHORT TERM GOALS: Target date: 07/06/23  Pt and spouse will be independent with advanced HEP for ROM, strengthening, and coordination.  Baseline: Goal status: INITIAL  2.  Pt will verbalize understanding of task modifications and/or potential AE needs to increase ease, safety, and independence w/ ADLs.  Baseline: Goal status: INITIAL  LONG TERM GOALS: Target date: 06/15/23  Pt will demonstrate improved functional grasp in RUE by 6# to open new jar, increased handwriting, and self-feeding.  Baseline: R: 8#, L: 30# Goal status: Not met  2.  Pt will demonstrate improved UE functional use for ADLs as evidenced by increasing box/ blocks score by 6 blocks with RUE. Baseline: R: 16 blocks, L: 55 blocks Goal status: Not met   3.  Pt will demonstrate improved shoulder ROM (shoulder flexion and external rotation) as needed for ADLs/IADLs (washing hair, putting on jacket, etc).  Baseline:  Goal status: MET - 06/14/23  4.  Pt will demonstrate improvement of finger extension by 15* TAM of index and long ringer finger to allow for more functional hand opening.  Baseline: -70 IF, -55 LF Goal status: Nearly met for index finger - -57 IF, -55 LF on 06/14/23  5.  Pt will demonstrate improved handwriting with ability to write name and simple grocery list with 90% legibility. Baseline:  Goal status: partially met - able to write name with built up handle and increased time  6.  Pt will demonstrate improvements in functional use of dominant RUE as evidenced by improvements on  FOTO. Baseline: TBD Goal status: Deferred  NEW LONG TERM  GOALS: Target date: 07/27/23  Pt will demonstrate improved functional grasp in RUE by 5# to open new jar, increased handwriting, and self-feeding.  Baseline: R: 9# on 05/31/23 Goal status: IN PROGRESS  2.  Pt will demonstrate improved UE functional use for ADLs as evidenced by increasing box/ blocks score by 4 blocks with RUE. Baseline: R: 18 blocks on 06/14/23 Goal status: Revised  3.  Pt will demonstrate improved shoulder flexion as needed for IADLs (to place a dish in the cabinet and/or laundry on a shelf at eye level) Baseline:  Goal status: INITIAL  4.  Pt will demonstrate improvement of finger extension by by 10* TAM of index and 15* TAM of long ringer finger to allow for more functional hand opening.  Baseline: -57 IF on 06/14/23 , -55 LF Goal status: Revised  5.  Pt will demonstrate improved handwriting with ability to write 10 item simple grocery list with 90% legibility. Baseline:  Goal status: Revised  6.  Pt will demonstrate improvements in functional use of dominant RUE as evidenced by improvements on Myrtis Ser and Allie Bossier Dickie La assessments. Baseline: TBD Goal status: Revised  ASSESSMENT:  CLINICAL IMPRESSION: Pt demonstrating increased shoulder mobility with flexion, internal, and external rotation.  OT strongly encouraged increased functional incorporation of RUE into self-care and grooming tasks as well as laundry tasks.  Pt continues to demonstrate decreased wrist flexion/extension and ulnar/radial deviation impacting functional grasp and ability to turn on/off water faucet.  Pt is showing improvements in measurements with shoulder ROM, index finger extension, however is demonstrating difficulty with sustained positioning due to muscle fatigue.  Pt will benefit from continued OT services to further address strength and endurance of dominant RUE to allow for increased engagement in ADLs/IADLs.    PERFORMANCE  DEFICITS: in functional skills including ADLs, IADLs, coordination, dexterity, tone, ROM, strength, pain, flexibility, Fine motor control, Gross motor control, mobility, balance, body mechanics, endurance, decreased knowledge of precautions, decreased knowledge of use of DME, and UE functional use and psychosocial skills including environmental adaptation and routines and behaviors.   IMPAIRMENTS: are limiting patient from ADLs and IADLs.   CO-MORBIDITIES: may have co-morbidities  that affects occupational performance. Patient will benefit from skilled OT to address above impairments and improve overall function.  MODIFICATION OR ASSISTANCE TO COMPLETE EVALUATION: Min-Moderate modification of tasks or assist with assess necessary to complete an evaluation.  OT OCCUPATIONAL PROFILE AND HISTORY: Detailed assessment: Review of records and additional review of physical, cognitive, psychosocial history related to current functional performance.  CLINICAL DECISION MAKING: Moderate - several treatment options, min-mod task modification necessary  REHAB POTENTIAL: Good  EVALUATION COMPLEXITY: Moderate    PLAN:  OT FREQUENCY: 2x/week  OT DURATION: 6 weeks  PLANNED INTERVENTIONS: self care/ADL training, therapeutic exercise, therapeutic activity, neuromuscular re-education, manual therapy, passive range of motion, functional mobility training, splinting, electrical stimulation, ultrasound, compression bandaging, moist heat, cryotherapy, patient/family education, psychosocial skills training, energy conservation, coping strategies training, and DME and/or AE instructions  RECOMMENDED OTHER SERVICES: NA  CONSULTED AND AGREED WITH PLAN OF CARE: Patient and family member/caregiver  PLAN FOR NEXT SESSION: Engaged in shoulder flexion in supine, sitting,and standing to facilitate increased strength and endurance with ROM, continue with gross grasp tasks, further address wrist flexion/extension and  ulnar/radial deviation, supination, and finger flexion and extension.   Referring diagnosis? 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 (if different than referring diagnosis)  Stiffness of right shoulder, not elsewhere classified  Other  lack of coordination  Hemiplegia and hemiparesis following cerebral infarction affecting right dominant side (HCC)  Muscle weakness (generalized) What was this (referring dx) caused by? []  Surgery []  Fall [x]  Ongoing issue []  Arthritis []  Other: ____________  Laterality: [x]  Rt []  Lt []  Both  Check all possible CPT codes:  *CHOOSE 10 OR LESS*    [x]  97110 (Therapeutic Exercise)  []  92507 (SLP Treatment)  [x]  97112 (Neuro Re-ed)   []  92526 (Swallowing Treatment)   []  97116 (Gait Training)   []  [id number] (Cognitive Training, 1st 15 minutes) [x]  97140 (Manual Therapy)   []  97130 (Cognitive Training, each add'l 15 minutes)  [x]  97164 (Re-evaluation)                              []  Other, List CPT Code ____________  [x]  97530 (Therapeutic Activities)     [x]  97535 (Self Care)   []  All codes above (97110 - 97535)  []  97012 (Mechanical Traction)  []  97014 (E-stim Unattended)  []  86578 (E-stim manual)  []  97033 (Ionto)  []  97035 (Ultrasound) []  97750 (Physical Performance Training) []  [id number] (Aquatic Therapy) []  97016 (Vasopneumatic Device) []  [id number] (Paraffin) []  97034 (Contrast Bath) []  97597 (Wound Care 1st 20 sq cm) []  97598 (Wound Care each add'l 20 sq cm) []  97760 (Orthotic Fabrication, Fitting, Training Initial) []  [id number] (Prosthetic Management and Training Initial) [x]  [id number] (Orthotic or Prosthetic Training/ Modification Subsequent)    Aditri Louischarles, OTR/L 06/14/2023, 3:43 PM

## 2023-06-20 ENCOUNTER — Ambulatory Visit: Payer: Medicare PPO

## 2023-06-20 DIAGNOSIS — R2681 Unsteadiness on feet: Secondary | ICD-10-CM

## 2023-06-20 DIAGNOSIS — R278 Other lack of coordination: Secondary | ICD-10-CM

## 2023-06-20 DIAGNOSIS — M6281 Muscle weakness (generalized): Secondary | ICD-10-CM

## 2023-06-20 DIAGNOSIS — R293 Abnormal posture: Secondary | ICD-10-CM | POA: Diagnosis not present

## 2023-06-20 DIAGNOSIS — I69351 Hemiplegia and hemiparesis following cerebral infarction affecting right dominant side: Secondary | ICD-10-CM

## 2023-06-20 DIAGNOSIS — R2689 Other abnormalities of gait and mobility: Secondary | ICD-10-CM | POA: Diagnosis not present

## 2023-06-20 NOTE — Therapy (Signed)
OUTPATIENT PHYSICAL THERAPY TREATMENT NOTE   Patient Name: Angela Buchanan MRN: [id number] DOB:[birth year], 72 y.o., female Today's Date: 06/20/2023  PCP:  Garth Bigness, MD   REFERRING PROVIDER:  Garth Bigness, MD       END OF SESSION:   PT End of Session - 06/20/23 1531     Visit Number 41    Date for PT Re-Evaluation 07/20/23    Authorization Type [employer]    Authorization - Number of Visits 9    Progress Note Due on Visit 50    PT Start Time 1445    PT Stop Time 1531    PT Time Calculation (min) 46 min    Equipment Utilized During Treatment Gait belt    Activity Tolerance Patient tolerated treatment well    Behavior During Therapy WFL for tasks assessed/performed                     Past Medical History:  Diagnosis Date   Atypical chest pain 07/19/2021   Bigeminy    no current med.   Breast cancer (HCC) 06/2017   right   Bruises easily    CAD in native artery 10/31/2022   Carotid bruit 11/13/2019   Dental crowns present    History of diverticulitis    Hypertension    states under control with med., has been on med. x 5 yr.   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   Past Surgical History:  Procedure Laterality Date   ABDOMINAL HYSTERECTOMY     partial   BREAST LUMPECTOMY Right 06/22/2017   Malignant   BREAST LUMPECTOMY WITH RADIOACTIVE SEED AND SENTINEL LYMPH NODE BIOPSY Right 06/22/2017   Procedure: RIGHT BREAST LUMPECTOMY WITH RADIOACTIVE SEED AND RIGHT AXILLARY DEEP SENTINEL LYMPH NODE BIOPSY WITH BLUE DYE INJECTION;  Surgeon: Claud Kelp, MD;  Location: [facility];  Service: General;  Laterality: Right;   BREAST LUMPECTOMY WITH RADIOACTIVE SEED LOCALIZATION  Left 06/22/2017   Procedure: LEFT BREAST LUMPECTOMY WITH RADIOACTIVE SEED LOCALIZATION;  Surgeon: Claud Kelp, MD;  Location: [facility];  Service: General;  Laterality: Left;   CATARACT EXTRACTION W/ INTRAOCULAR LENS  IMPLANT, BILATERAL Bilateral    EXCISION OF BREAST BIOPSY Left 06/22/2017   benign   LOOP RECORDER INSERTION N/A 05/16/2022   Procedure: LOOP RECORDER INSERTION;  Surgeon: Lanier Prude, MD;  Location: [facility];  Service: Cardiovascular;  Laterality: N/A;   PERCUTANEOUS PINNING Right 06/22/2022   Procedure: PERCUTANEOUS PINNING OF RIGHT HIP;  Surgeon: Bjorn Pippin, MD;  Location: [facility];  Service: Orthopedics;  Laterality: Right;   TONSILLECTOMY     age 71   Patient Active Problem List   Diagnosis Date Noted   Right spastic hemiplegia (HCC) 12/14/2022   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   CAD in native artery 10/31/2022   Malnutrition of moderate degree 06/22/2022   Closed fracture of femur, neck (HCC) 06/21/2022   Urinary frequency 06/21/2022   Hypokalemia 06/21/2022   Heart murmur 06/21/2022   Hypothyroidism 06/21/2022   Dysphagia 05/17/2022   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   CVA (cerebral vascular accident) (HCC) 05/12/2022   Osteoporosis 01/24/2022   Atypical chest pain 07/19/2021   Temporomandibular joint (TMJ) pain  07/16/2020   Referred otalgia of both ears 07/16/2020   PAC (premature atrial contraction) 05/11/2020   PVC (premature ventricular contraction) 05/11/2020   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   Carotid bruit 11/13/2019   Pure hypercholesterolemia 11/13/2019   Malignant neoplasm of upper-outer quadrant of right breast in female, estrogen receptor positive (HCC) 05/22/2017   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   Diplopia 09/17/2014   Dizziness and giddiness 09/17/2014   Essential hypertension 07/22/2014   Chest pain 07/22/2014   Family history of heart disease 07/22/2014    REFERRING  DIAG:  R29.6 (ICD-10-CM) - Repeated falls, closed fracture of Rt hip    THERAPY DIAG:  Hemiplegia and hemiparesis following cerebral infarction affecting right dominant side (HCC)  Other abnormalities of gait and mobility  Abnormal posture  Other lack of coordination  Muscle weakness (generalized)  Unsteadiness on feet  Rationale for Evaluation and Treatment Rehabilitation  PERTINENT HISTORY: hypertension, breast cancer, hyperlipidemia, Lt hip fracture secondary to fall 06/2022, CVA 06/2022   PRECAUTIONS:  Fall, Rt breast cancer with node removal, osteoporosis    SUBJECTIVE:                                                                                                                                                                                      SUBJECTIVE STATEMENT:  I am doing good today. I am not having any pain anywhere. I had to turn at my counter and I did not lose my balance.   Pt spouse present during visit.   PAIN:  PAIN:  Are you having pain? Yes NPRS scale: 0/10 Pain location: Right shoulder PAIN TYPE: aching Aggravating factors: use Relieving factors: Medication    OBJECTIVE: (objective measures completed at initial evaluation unless otherwise dated)  DIAGNOSTIC FINDINGS: Rt hip fracture and ORIF    COGNITION: Overall cognitive status: Within functional limits for tasks assessed                         SENSATION: WFL  POSTURE: rounded shoulders, forward head, flexed trunk , and weight shift left   PALPATION: NA   LOWER EXTREMITY ROM: Rt hip limited by 50%, hamstrings limited by 50% bil.    LOWER EXTREMITY MMT:   MMT Right eval Right  09/27/22 Left eval Right 10/30/22 Right  01/17/23 Right 05/23/23 Left 10/30/22 Left 01/17/23  Hip flexion 3+ 4- 4- 4- 4 4 4- 4+  Hip extension            Hip abduction 3-  4     4  Hip adduction            Hip  internal rotation            Hip external rotation            Knee flexion 3- 4- 3+  4 4   4+  Knee extension 3+ 4 4- 4 painful hip 4+ 4+ 4- 4+  Ankle dorsiflexion 3+ 4- 4- 4-   4   Ankle plantarflexion            Ankle inversion            Ankle eversion             (Blank rows = not tested)   FUNCTIONAL TESTS:   Eval: Timed up and go (TUG): 1 min, 27 seconds   09/27/22: 5x sit to stand: 29 seconds with use of Lt hand  TUG: 27 seconds   10/30/22: 5x sit to stand: 23 sec use of LT hand     TUG: 26 sec with standard cane   11/20/22: 5x sit to stand: 18.68 seconds    TUG: 19.45 without device    3 minute walk test: 225 feet with a standard cane   01/10/23: 5x sit to stand: 15.77 seconds   01/17/23: TUG with cane: 15.41 seconds with cane    3 min walk test: 313 feet with cane    03/07/23: 3 min walk 275 feet with single point cane   TUG: 16 seconds   03/14/23: 6 min walk test: 723 feet with standard cane    TUG: 14.16 without cane    5x sit to stand: 12.15 seconds without hand   03/28/23 TUG 11 seconds without cane  5x STS 10.6 seconds no Ues  04/18/2023: 6 min walk test:  532 ft with [facility]  05/09/23: 6 min walk test with SPC: 667 feet - RPE 5  05/23/23: TUG: 11 seconds without cane 5x sit to stand: 12.71 seconds  6 min walk test: 763 feet  06/06/23:  5x sit to stand: 11.98 seconds   TODAY'S TREATMENT:   DATE:  06/20/2023 Nustep level 3 x10 minutes with LE only. >55 spm PT present throughout to discuss status Farmers carry 6# Lt UE from gym mat table to mirror 2 laps Alternating step tap on 6" step 2x10 with CGA Leg Press (seat at 5) 55# 2x10, single leg: Lt 35#, Rt 15# 2x10 bil each Sit to stand: 6# 2x10 Step tap and step to with colored discs in a circle Ambulation around clinic for 4 minutes using a straight cane.   DATE:  06/06/2023 Nustep level 3 x10 minutes with LE only. >55 spm PT present throughout to discuss status Farmers carry 6# Lt UE from gym mat table to mirror 2 laps Alternating step tap on 6" step 2x10 with CGA Leg Press (seat at 5) 55# 2x10,  single leg: Lt 35#, Rt 15# 2x10 bil each Weight shifting to look behind to a spot on the wall- improved weight shift today Sit to stand: 6# 2x10- did well with increased weight Step tap and step to with colored discs in a circle  DATE:  05/30/2023 Nustep level 3 x8 minutes with LE only. >55 spm PT present throughout to discuss status Farmers carry 6# Lt UE from gym mat table to mirror 2 laps Alternating step tap on 6" step 2x10 with CGA Leg Press (seat at 5) 55# 2x10, single leg: Lt 35#, Rt 15# 2x10 bil each Weight shifting to look behind to a spot on the wall Sit to stand: 5# 2x10 Step  tap and step to with colored discs in a circle   PATIENT EDUCATION:  Education details: Access Code: [email or web address] Person educated: Patient Education method: Explanation, Demonstration, and Handouts Education comprehension: verbalized understanding and returned demonstration   HOME EXERCISE PROGRAM: Access Code: [email or web address] URL: [email or web address] Date: 09/27/2022 Prepared by: Tresa Endo  Exercises - Seated Long Arc Quad  - 3 x daily - 7 x weekly - 2 sets - 10 reps - 5 hold - Seated March   - 3 x daily - 7 x weekly - 3 sets - 10 reps - Seated Heel Toe Raises   - 3 x daily - 7 x weekly - 2 sets - 10 reps - Seated Isometric Hip Adduction with Ball  - 3 x daily - 7 x weekly - 2 sets - 10 reps - Sit to Stand with Armchair  - 2 x daily - 7 x weekly - 2 sets - 5-10 reps - Standing Hip Abduction with Counter Support  - 1 x daily - 7 x weekly - 2 sets - 10 reps - Heel Raises with Counter Support  - 2 x daily - 7 x weekly - 2 sets - 10 reps  Patient Education - Walking with a Single [employer] - Modified 4 Point Gait Pattern  ASSESSMENT:   CLINICAL IMPRESSION:     Today's treatment session focused on general strengthening and balance. Patient required verbal cues to emphasize trunk rotation while performing unilateral farmers carry. Patient had three episodes of instability during treatment session  that she was able to self recover using stepping strategy or by sitting on surface behind her. Patient tolerated treatment session well.  Patient will benefit from skilled PT to address the below impairments and improve overall function.    OBJECTIVE IMPAIRMENTS: Abnormal gait, decreased activity tolerance, decreased balance, decreased mobility, difficulty walking, decreased strength, decreased safety awareness, impaired perceived functional ability, impaired flexibility, postural dysfunction, and pain.    ACTIVITY LIMITATIONS: carrying, lifting, sitting, standing, stairs, transfers, hygiene/grooming, and locomotion level   PARTICIPATION LIMITATIONS: meal prep, cleaning, laundry, driving, shopping, and community activity   PERSONAL FACTORS: Age, Past/current experiences, and 1-2 comorbidities: CVA, falls, Rt hip fracture with ORIF  are also affecting patient's functional outcome.    REHAB POTENTIAL: Good   CLINICAL DECISION MAKING: Evolving/moderate complexity   EVALUATION COMPLEXITY: Moderate     GOALS: Goals reviewed with patient? Yes   SHORT TERM GOALS: Target date: 09/06/2022   Be independent in initial HEP Baseline: Goal status: Goal met 08/14/22   2.  Improve LE strength to perform sit to stand with moderate Lt UE support Baseline:  Goal status: Goal met 08/28/22  3.  Perform TUG in < or = to 60 seconds to reduce falls risk Baseline: 27 seconds (09/27/22) Goal status: MET      LONG TERM GOALS: Target date:  07/20/23   Be independent in advanced HEP Baseline: independent in current HEP and has tolerated progress when appropriate (05/23/23)  Goal status: in progress    2.  Perform TUG in < or = to 13 seconds to reduce falls risk Baseline: 11 seconds without cane (05/23/23) Goal status: met   3.  ambulate > or = to 875 feet in 6 minutes to improve community ambulation and independence  Baseline: 763 (05/23/23) Goal status: In progress      4.  perform 5x sit to  stand in < or = to 12 seconds to reduce falls risk  Baseline: 11.98 seconds (06/06/23)  Goal Status:  In progress   8. Improve balance to walk her dog short distances with independence   Baseline: walking driveway with dog (05/23/23)  Goal status: in progress     PLAN:   PT FREQUENCY: 1x/week   PT DURATION: 10 weeks   PLANNED INTERVENTIONS: Therapeutic exercises, Therapeutic activity, Neuromuscular re-education, Balance training, Gait training, Patient/Family education, Self Care, Joint mobilization, Stair training, Dry Needling, Electrical stimulation, Cryotherapy, Moist heat, Taping, Manual therapy, and Re-evaluation   PLAN FOR NEXT SESSION: Continue to work on gait , strength and balance. Endurance, change of direction.     Claude Manges, PT 06/20/23 5:12 PM  Lorrene Reid, PT 06/20/23 3:43 PM   [facility] [address], [address] [place], [place] [address] Phone # [phone] Fax [phone]

## 2023-06-26 ENCOUNTER — Ambulatory Visit: Payer: Medicare PPO | Admitting: Occupational Therapy

## 2023-06-26 DIAGNOSIS — M79601 Pain in right arm: Secondary | ICD-10-CM

## 2023-06-26 DIAGNOSIS — M6281 Muscle weakness (generalized): Secondary | ICD-10-CM

## 2023-06-26 DIAGNOSIS — R2689 Other abnormalities of gait and mobility: Secondary | ICD-10-CM

## 2023-06-26 DIAGNOSIS — R278 Other lack of coordination: Secondary | ICD-10-CM

## 2023-06-26 DIAGNOSIS — M25611 Stiffness of right shoulder, not elsewhere classified: Secondary | ICD-10-CM

## 2023-06-26 DIAGNOSIS — I69351 Hemiplegia and hemiparesis following cerebral infarction affecting right dominant side: Secondary | ICD-10-CM

## 2023-06-26 NOTE — Therapy (Signed)
OUTPATIENT OCCUPATIONAL THERAPY NEURO  Treatment Note  Patient Name: Angela Buchanan MRN: [id number] DOB:19-Apr-1951, 72 y.o., female Today's Date: 06/26/2023  PCP: Shon Hale, MD REFERRING PROVIDER: Shon Hale, MD     END OF SESSION:  OT End of Session - 06/26/23 1504     Visit Number 15    Number of Visits 26    Date for OT Re-Evaluation 07/27/23    Authorization Type [employer]    OT Start Time 1448    OT Stop Time 1530    OT Time Calculation (min) 42 min    Behavior During Therapy [facility] for tasks assessed/performed                           Past Medical History:  Diagnosis Date   Atypical chest pain 07/19/2021   Bigeminy    no current med.   Breast cancer (HCC) 06/2017   right   Bruises easily    CAD in native artery 10/31/2022   Carotid bruit 11/13/2019   Dental crowns present    History of diverticulitis    Hypertension    states under control with med., has been on med. x 5 yr.   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   Past Surgical History:  Procedure Laterality Date   ABDOMINAL HYSTERECTOMY     partial   BREAST LUMPECTOMY Right 06/22/2017   Malignant   BREAST LUMPECTOMY WITH RADIOACTIVE SEED AND SENTINEL LYMPH NODE BIOPSY Right 06/22/2017   Procedure: RIGHT BREAST LUMPECTOMY WITH RADIOACTIVE SEED AND RIGHT AXILLARY DEEP SENTINEL LYMPH NODE BIOPSY WITH BLUE DYE INJECTION;  Surgeon: Claud Kelp, MD;  Location: [facility];  Service: General;  Laterality: Right;   BREAST LUMPECTOMY WITH RADIOACTIVE SEED LOCALIZATION Left 06/22/2017   Procedure: LEFT BREAST LUMPECTOMY WITH RADIOACTIVE SEED LOCALIZATION;  Surgeon: Claud Kelp, MD;  Location: [facility];  Service: General;  Laterality: Left;   CATARACT EXTRACTION W/ INTRAOCULAR LENS  IMPLANT, BILATERAL Bilateral    EXCISION OF BREAST BIOPSY Left 06/22/2017   benign   LOOP RECORDER INSERTION N/A 05/16/2022   Procedure: LOOP RECORDER INSERTION;  Surgeon: Lanier Prude, MD;  Location: [facility];  Service: Cardiovascular;  Laterality: N/A;   PERCUTANEOUS PINNING Right 06/22/2022   Procedure: PERCUTANEOUS PINNING OF RIGHT HIP;  Surgeon: Bjorn Pippin, MD;  Location: [facility];  Service: Orthopedics;  Laterality: Right;   TONSILLECTOMY     age 1   Patient Active Problem List   Diagnosis Date Noted   Right spastic hemiplegia (HCC) 12/14/2022   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 05/11/2020   PVC (premature ventricular contraction) 05/11/2020   Ventricular bigeminy  11/13/2019   Carotid bruit 11/13/2019   Pure hypercholesterolemia 11/13/2019   Malignant neoplasm of upper-outer quadrant of right breast in female, estrogen receptor positive (HCC) 05/22/2017   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   Diplopia 09/17/2014   Dizziness and giddiness 09/17/2014   Essential hypertension 07/22/2014   Chest pain 07/22/2014   Family history of heart disease 07/22/2014    ONSET DATE: 05/13/22 (new referral 04/09/23)  REFERRING DIAG: G83.23 (ICD-10-CM) - 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  THERAPY  DIAG:  Hemiplegia and hemiparesis following cerebral infarction affecting right dominant side (HCC)  Other abnormalities of gait and mobility  Other lack of coordination  Muscle weakness (generalized)  Stiffness of right shoulder, not elsewhere classified  Pain in right arm  Rationale for Evaluation and Treatment: Rehabilitation  SUBJECTIVE:   SUBJECTIVE STATEMENT: "I've been practicing, like this (demonstrating raising R arm overhead with LUE)." Pt accompanied by: self and significant other ([name])  PERTINENT HISTORY: hypertension, breast cancer, hyperlipidemia, Lt hip fracture secondary to fall 06/2022, CVA 05/2022    PRECAUTIONS: Fall, Rt breast cancer with node removal, osteoporosis   WEIGHT BEARING RESTRICTIONS: No  PAIN:  Are you having pain? No  FALLS: Has patient fallen in last 6 months? No  LIVING ENVIRONMENT: Lives with: lives with their spouse Lives in: House/apartment Stairs: Yes: External: 2 steps; on left going up Has following equipment at home: Quad cane large base, Environmental consultant - 2 wheeled, Hemi walker, Wheelchair (manual), shower chair, and Grab bars  PLOF: Independent and Independent with basic ADLs  PATIENT GOALS: to have better use my arm and hand  OBJECTIVE:   HAND DOMINANCE: Right  ADLs:  Transfers/ambulation related to ADLs: Utilizes [facility] for mobility in the community, 80% in the house but will walk without occasionally Eating: husband cuts foods, utilizing built up handles for self-feeding Grooming: needs assistance with washing hair UB Dressing: needs assistance with fastening bra LB Dressing: Mod-max assist for donning pants due to fear of falling, able to don socks and slip on shoes - unable to tie shoes Toileting: Mod I Bathing: Supervision Tub Shower transfers: Supervision, but able to complete transfers with use of grab bars Equipment: Shower seat with back, Grab bars, and Walk in shower  IADLs: Handwriting:  TBA at next  session  MOBILITY STATUS: Needs Assist: utilized [facility] for mobility and Hx of falls  POSTURE COMMENTS:  rounded shoulders and forward head  ACTIVITY TOLERANCE: Activity tolerance: fatigues with increased mobility and effort  FUNCTIONAL OUTCOME MEASURES: Upper Extremity Functional Scale (UEFS): TBD  UPPER EXTREMITY ROM:    Active ROM Right eval Right 05/31/23 Right 06/14/23  Shoulder flexion 87 93 98  Shoulder abduction 78 82 86  Shoulder adduction     Shoulder extension     Shoulder internal rotation 90% 100% 100%  Shoulder external rotation 50% 75% 95% (requires increased time)  Elbow flexion 132 137 138  Elbow extension -17 -12 -12  Wrist flexion 19 40 48  Wrist extension 35 35 40  Wrist ulnar deviation 15 10 15   Wrist radial deviation 13 15 15   Wrist pronation WNL  WNL  Wrist supination 20 from neutral 65* 65  (Blank rows = not tested)  UPPER EXTREMITY MMT:     MMT Right eval Left eval  Shoulder flexion    Shoulder abduction    Shoulder adduction    Shoulder extension    Shoulder internal rotation    Shoulder external rotation    Middle  trapezius    Lower trapezius    Elbow flexion    Elbow extension    Wrist flexion    Wrist extension    Wrist ulnar deviation    Wrist radial deviation    Wrist pronation    Wrist supination    (Blank rows = not tested)    Active ROM Extension (as flexion to loose gross grasp) Right eval Right 05/31/23 Right 06/14/23  Thumb MCP (0-60)     Thumb IP (0-80)     Thumb Radial abd/add (0-55)     Thumb Palmar abd/add (0-45)     Thumb opposition to index     Index MCP (0-90) 0  0  Index PIP (0-100) -30 -50 -22  Index DIP (0-70) -40 -35 -35  Long MCP (0-90) -5 0 0  Long PIP (0-100) -45 -35 -40  Long DIP (0-70) -5 -25 -15  Ring MCP (0-90_ 0 0   Ring PIP (0-100) -40 -40   Ring DIP (0-70) -20 -20   Little MCP (0-90) -7 0   Little PIP (0-100) -25 -25   Little DIP (0-70) -5 0   (Blank rows = not tested)    HAND  FUNCTION: 05/31/23 Grip strength: Right: 9#  Grip strength: Right: 7,9 = 8# lbs; Left: 30 lbs  COORDINATION: 05/31/23 9 Hole Peg test: Right: able to pick up one peg with thumb and long finger, however unable to rotate to place into peg board Box and Blocks:  Right 15 blocks  Finger Nose Finger test: slow and deliberate with RUE, but able to touch both nose and finger at this time 9 Hole Peg test: Right: unable to pick up any pegs with RUE  Box and Blocks:  Right 16 blocks, Left 55 blocks  SENSATION: Light touch: WFL Hot/Cold: WFL  EDEMA: no swelling on eval  MUSCLE TONE: RUE: Mild and Hypertonic  COGNITION: Overall cognitive status: Within functional limits for tasks assessed  VISION: Subjective report: pt reports wearing glasses more since CVA Baseline vision: Wears glasses all the time, double vision after cataract removal Visual history: cataracts  OBSERVATIONS: Pt attempting grasp and pinch activities, frequently not incorporating index finger on R with increased flexion away from task.   TODAY'S TREATMENT:                                                           06/26/23 Moist heat: applied moist heat to R hand with focus on facilitating relaxation in digits prior to engaging in reach and coordination. PROM: OT providing PROM and stretch to index and long finger with focus on extension at PIP and DIP.  Pt able to achieve full extension passively, but not actively UE NMR: engaged in shoulder flexion, elbow extension, and grasp with use of rainbow arc.  Pt demonstrating loose gross grasp with decreased use of index finger but able to sustain grasp and move rings from one side to the other of arc demonstrating improved shoulder flexion and horizontal adduction.   Coordination: rotating large dice in finger tips to select given number.  Pt demonstrating use of forearm supination and pronation to attempt to rotate, pt unable to coordinate index and long finger to rotate dice in  finger tips.  Transitioned to picking up and stacking with focus on incorporating use  of index finger.  Pt dropping dice 20% of time.  Transitioned to placing items into tall cup to facilitate increased reach and shoulder flexion. Kinesiotape: applied kinesiotape to R hand and digits in retrograde fashion to facilitate increased passive extension while still allowing pt to engage in grasp and release exercises.   06/14/23 Self-care: educated on use of RUE during self-care tasks (ie washing hair, putting on glasses, etc.).  OT reinforced education on use of RUE even with hand over hand during aspects of bathing and dressing to continue to incorporate during functional tasks as pt with increased ROM with shoulder flexion, internal and external rotation.  Reiterated functional reach, providing either target and/or functional task to facilitate increased and consistent ROM. Box and blocks: R: 18 blocks - however pt completing 8 in 20 sec and 11 in 30 sec, therefore really demonstrating decrease due to fatigue with increased shoulder ROM. Handwriting: Pt able to write name with built up handle, still requiring significant effort and decreased legibility.   06/12/23 UE ROM Ball rolling/rotation: engaged in shoulder flexion and elbow extension with med ball, transitioning to rotation to facilitate pronation and supination. Ulnar/radial deviation: engaged in ulnar/radial deviation with 1# dumbbell with focus on sustained grasp and strengthening as needed to turn on water faucet. Coordination Golf balls: picking up and placing golf balls into bowl with focus on functional grasp and reach.  Pt continues to demonstrate decreased index finger incorporation into grasp.  OT encouraging exaggerated hand opening with release of balls. Kinesiotape: applied kinesiotape to R hand and digits in retrograde fashion to facilitate increased passive extension while still allowing pt to engage in grasp and release  exercises.   PATIENT EDUCATION: Education details: ongoing condition specific education. Person educated: Patient and Spouse Education method: Explanation, Demonstration, and Verbal cues Education comprehension: verbalized understanding and needs further education  HOME EXERCISE PROGRAM: Access Code: [email or web address] URL: [email or web address] Date: 04/23/2023 Prepared by: [facility] - [facility]    GOALS: Goals reviewed with patient? Yes  SHORT TERM GOALS: Target date: 07/06/23  Pt and spouse will be independent with advanced HEP for ROM, strengthening, and coordination.  Baseline: Goal status: IN PROGRESS  2.  Pt will verbalize understanding of task modifications and/or potential AE needs to increase ease, safety, and independence w/ ADLs.  Baseline: Goal status: IN PROGRESS  LONG TERM GOALS: Target date: 07/27/23  Pt will demonstrate improved functional grasp in RUE by 5# to open new jar, increased handwriting, and self-feeding.  Baseline: R: 9# on 05/31/23 Goal status: IN PROGRESS  2.  Pt will demonstrate improved UE functional use for ADLs as evidenced by increasing box/ blocks score by 4 blocks with RUE. Baseline: R: 18 blocks on 06/14/23 Goal status: IN PROGRESS  3.  Pt will demonstrate improved shoulder flexion as needed for IADLs (to place a dish in the cabinet and/or laundry on a shelf at eye level) Baseline:  Goal status: IN PROGRESS  4.  Pt will demonstrate improvement of finger extension by by 10* TAM of index and 15* TAM of long ringer finger to allow for more functional hand opening.  Baseline: -57 IF on 06/14/23 , -55 LF Goal status: IN PROGRESS  5.  Pt will demonstrate improved handwriting with ability to write 10 item simple grocery list with 90% legibility. Baseline:  Goal status: IN PROGRESS  6.  Pt will demonstrate improvements in functional use of dominant RUE as evidenced by improvements on Myrtis Ser and Allie Bossier - Brody  assessments. Baseline: TBD Goal status: IN PROGRESS  ASSESSMENT:  CLINICAL IMPRESSION: Pt demonstrating increased shoulder mobility with flexion with reaching and stacking.  Pt demonstrating mild increase in compensatory shoulder movements with fatigue.  Discussed modification of tasks with increased repetitions, distance, and height to progressively increase challenge.  Pt continues to report improved sensation in dominant RUE with kinesiotape, has not attempted compression glove.  PERFORMANCE DEFICITS: in functional skills including ADLs, IADLs, coordination, dexterity, tone, ROM, strength, pain, flexibility, Fine motor control, Gross motor control, mobility, balance, body mechanics, endurance, decreased knowledge of precautions, decreased knowledge of use of DME, and UE functional use and psychosocial skills including environmental adaptation and routines and behaviors.   IMPAIRMENTS: are limiting patient from ADLs and IADLs.   CO-MORBIDITIES: may have co-morbidities  that affects occupational performance. Patient will benefit from skilled OT to address above impairments and improve overall function.  MODIFICATION OR ASSISTANCE TO COMPLETE EVALUATION: Min-Moderate modification of tasks or assist with assess necessary to complete an evaluation.  OT OCCUPATIONAL PROFILE AND HISTORY: Detailed assessment: Review of records and additional review of physical, cognitive, psychosocial history related to current functional performance.  CLINICAL DECISION MAKING: Moderate - several treatment options, min-mod task modification necessary  REHAB POTENTIAL: Good  EVALUATION COMPLEXITY: Moderate    PLAN:  OT FREQUENCY: 2x/week  OT DURATION: 6 weeks  PLANNED INTERVENTIONS: self care/ADL training, therapeutic exercise, therapeutic activity, neuromuscular re-education, manual therapy, passive range of motion, functional mobility training, splinting, electrical stimulation, ultrasound, compression  bandaging, moist heat, cryotherapy, patient/family education, psychosocial skills training, energy conservation, coping strategies training, and DME and/or AE instructions  RECOMMENDED OTHER SERVICES: NA  CONSULTED AND AGREED WITH PLAN OF CARE: Patient and family member/caregiver  PLAN FOR NEXT SESSION: Engaged in shoulder flexion in supine, sitting,and standing to facilitate increased strength and endurance with ROM, continue with gross grasp tasks, further address wrist flexion/extension and ulnar/radial deviation, supination, and finger flexion and extension.     Rosalio Loud, OTR/L 06/26/2023, 3:04 PM  [facility] [address], [address] [place], [place] [address] Phone # [phone] Fax # [phone]

## 2023-06-27 ENCOUNTER — Ambulatory Visit: Payer: Medicare PPO

## 2023-06-27 DIAGNOSIS — R2689 Other abnormalities of gait and mobility: Secondary | ICD-10-CM

## 2023-06-27 DIAGNOSIS — R2681 Unsteadiness on feet: Secondary | ICD-10-CM | POA: Diagnosis not present

## 2023-06-27 DIAGNOSIS — I69351 Hemiplegia and hemiparesis following cerebral infarction affecting right dominant side: Secondary | ICD-10-CM

## 2023-06-27 DIAGNOSIS — M6281 Muscle weakness (generalized): Secondary | ICD-10-CM | POA: Diagnosis not present

## 2023-06-27 DIAGNOSIS — R293 Abnormal posture: Secondary | ICD-10-CM | POA: Diagnosis not present

## 2023-06-27 DIAGNOSIS — R278 Other lack of coordination: Secondary | ICD-10-CM | POA: Diagnosis not present

## 2023-06-27 NOTE — Therapy (Signed)
OUTPATIENT PHYSICAL THERAPY TREATMENT NOTE   Patient Name: Angela Buchanan MRN: [id number] DOB:08/13/1951, 72 y.o., female Today's Date: 06/27/2023  PCP:  Garth Bigness, MD   REFERRING PROVIDER:  Garth Bigness, MD       END OF SESSION:   PT End of Session - 06/27/23 1535     Visit Number 42    Date for PT Re-Evaluation 07/20/23    Authorization Type [employer]    Authorization Time Period 8 visits 8/22-10/18/24    Authorization - Visit Number 4    Authorization - Number of Visits 8    Progress Note Due on Visit 50    PT Start Time 1449    PT Stop Time 1533    PT Time Calculation (min) 44 min    Equipment Utilized During Treatment Gait belt    Activity Tolerance Patient tolerated treatment well    Behavior During Therapy WFL for tasks assessed/performed                      Past Medical History:  Diagnosis Date   Atypical chest pain 07/19/2021   Bigeminy    no current med.   Breast cancer (HCC) 06/2017   right   Bruises easily    CAD in native artery 10/31/2022   Carotid bruit 11/13/2019   Dental crowns present    History of diverticulitis    Hypertension    states under control with med., has been on med. x 5 yr.   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   Past Surgical History:  Procedure Laterality Date   ABDOMINAL HYSTERECTOMY     partial   BREAST LUMPECTOMY Right 06/22/2017   Malignant   BREAST LUMPECTOMY WITH RADIOACTIVE SEED AND SENTINEL LYMPH NODE BIOPSY Right 06/22/2017   Procedure: RIGHT BREAST LUMPECTOMY WITH RADIOACTIVE SEED AND RIGHT AXILLARY DEEP SENTINEL LYMPH NODE BIOPSY WITH BLUE DYE INJECTION;  Surgeon: Claud Kelp, MD;  Location: [facility];   Service: General;  Laterality: Right;   BREAST LUMPECTOMY WITH RADIOACTIVE SEED LOCALIZATION Left 06/22/2017   Procedure: LEFT BREAST LUMPECTOMY WITH RADIOACTIVE SEED LOCALIZATION;  Surgeon: Claud Kelp, MD;  Location:  SURGERY CENTER;  Service: General;  Laterality: Left;   CATARACT EXTRACTION W/ INTRAOCULAR LENS  IMPLANT, BILATERAL Bilateral    EXCISION OF BREAST BIOPSY Left 06/22/2017   benign   LOOP RECORDER INSERTION N/A 05/16/2022   Procedure: LOOP RECORDER INSERTION;  Surgeon: Lanier Prude, MD;  Location: [facility];  Service: Cardiovascular;  Laterality: N/A;   PERCUTANEOUS PINNING Right 06/22/2022   Procedure: PERCUTANEOUS PINNING OF RIGHT HIP;  Surgeon: Bjorn Pippin, MD;  Location: [facility];  Service: Orthopedics;  Laterality: Right;   TONSILLECTOMY     age 2   Patient Active Problem List   Diagnosis Date Noted   Right spastic hemiplegia (HCC) 12/14/2022   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   CAD in native artery 10/31/2022   Malnutrition of moderate degree 06/22/2022   Closed fracture of femur, neck (HCC) 06/21/2022   Urinary frequency 06/21/2022   Hypokalemia 06/21/2022   Heart murmur 06/21/2022   Hypothyroidism 06/21/2022   Dysphagia 05/17/2022   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   CVA (cerebral vascular accident) (  HCC) 05/12/2022   Osteoporosis 01/24/2022   Atypical chest pain 07/19/2021   Temporomandibular joint (TMJ) pain 07/16/2020   Referred otalgia of both ears 07/16/2020   PAC (premature atrial contraction) 05/11/2020   PVC (premature ventricular contraction) 05/11/2020   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   Carotid bruit 11/13/2019   Pure hypercholesterolemia 11/13/2019   Malignant neoplasm of upper-outer quadrant of right breast in female, estrogen receptor positive (HCC) 05/22/2017   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   Diplopia 09/17/2014   Dizziness and giddiness 09/17/2014   Essential hypertension  07/22/2014   Chest pain 07/22/2014   Family history of heart disease 07/22/2014    REFERRING DIAG:  R29.6 (ICD-10-CM) - Repeated falls, closed fracture of Rt hip    THERAPY DIAG:  Other abnormalities of gait and mobility  Muscle weakness (generalized)  Hemiplegia and hemiparesis following cerebral infarction affecting right dominant side (HCC)  Rationale for Evaluation and Treatment Rehabilitation  PERTINENT HISTORY: hypertension, breast cancer, hyperlipidemia, Lt hip fracture secondary to fall 06/2022, CVA 06/2022   PRECAUTIONS:  Fall, Rt breast cancer with node removal, osteoporosis    SUBJECTIVE:                                                                                                                                                                                      SUBJECTIVE STATEMENT:  I am doing good today. I am not having any pain anywhere. I had to turn at my counter and I did not lose my balance.   Pt spouse present during visit.   PAIN:  PAIN:  Are you having pain? Yes NPRS scale: 0/10 Pain location: Right shoulder PAIN TYPE: aching Aggravating factors: use Relieving factors: Medication    OBJECTIVE: (objective measures completed at initial evaluation unless otherwise dated)  DIAGNOSTIC FINDINGS: Rt hip fracture and ORIF    COGNITION: Overall cognitive status: Within functional limits for tasks assessed                         SENSATION: WFL  POSTURE: rounded shoulders, forward head, flexed trunk , and weight shift left   PALPATION: NA   LOWER EXTREMITY ROM: Rt hip limited by 50%, hamstrings limited by 50% bil.    LOWER EXTREMITY MMT:   MMT Right eval Right  09/27/22 Left eval Right 10/30/22 Right  01/17/23 Right 05/23/23 Left 10/30/22 Left 01/17/23  Hip flexion 3+ 4- 4- 4- 4 4 4- 4+  Hip extension            Hip abduction 3-  4     4  Hip adduction  Hip internal rotation            Hip external rotation            Knee  flexion 3- 4- 3+  4 4  4+  Knee extension 3+ 4 4- 4 painful hip 4+ 4+ 4- 4+  Ankle dorsiflexion 3+ 4- 4- 4-   4   Ankle plantarflexion            Ankle inversion            Ankle eversion             (Blank rows = not tested)   FUNCTIONAL TESTS:   Eval: Timed up and go (TUG): 1 min, 27 seconds   09/27/22: 5x sit to stand: 29 seconds with use of Lt hand  TUG: 27 seconds   10/30/22: 5x sit to stand: 23 sec use of LT hand     TUG: 26 sec with standard cane   11/20/22: 5x sit to stand: 18.68 seconds    TUG: 19.45 without device    3 minute walk test: 225 feet with a standard cane   01/10/23: 5x sit to stand: 15.77 seconds   01/17/23: TUG with cane: 15.41 seconds with cane    3 min walk test: 313 feet with cane    03/07/23: 3 min walk 275 feet with single point cane   TUG: 16 seconds   03/14/23: 6 min walk test: 723 feet with standard cane    TUG: 14.16 without cane    5x sit to stand: 12.15 seconds without hand   03/28/23 TUG 11 seconds without cane  5x STS 10.6 seconds no Ues  04/18/2023: 6 min walk test:  532 ft with [facility]  05/09/23: 6 min walk test with SPC: 667 feet - RPE 5  05/23/23: TUG: 11 seconds without cane 5x sit to stand: 12.71 seconds  6 min walk test: 763 feet  06/06/23:  5x sit to stand: 11.98 seconds   TODAY'S TREATMENT:   DATE:  06/27/2023 Nustep level 3 x10 minutes with LE only. >55 spm PT present throughout to discuss status Farmers carry 6# Lt UE from gym mat table to mirror 2 laps Alternating step tap on 6" step 2x10 with CGA Leg Press (seat at 5) 55# 2x10, single leg: Lt 35#, Rt 15# 2x10 bil each Sit to stand: 6# 2x10 Step to over hurdles with hand held assist on the Lt: Lt lead and Rt lead with verbal cues for step height Ambulation around clinic for 4 minutes using a straight cane.  DATE:  06/20/2023 Nustep level 3 x10 minutes with LE only. >55 spm PT present throughout to discuss status Farmers carry 6# Lt UE from gym mat table to mirror 2  laps Alternating step tap on 6" step 2x10 with CGA Leg Press (seat at 5) 55# 2x10, single leg: Lt 35#, Rt 15# 2x10 bil each Sit to stand: 6# 2x10 Step tap and step to with colored discs in a circle Ambulation around clinic for 4 minutes using a straight cane.   DATE:  06/06/2023 Nustep level 3 x10 minutes with LE only. >55 spm PT present throughout to discuss status Farmers carry 6# Lt UE from gym mat table to mirror 2 laps Alternating step tap on 6" step 2x10 with CGA Leg Press (seat at 5) 55# 2x10, single leg: Lt 35#, Rt 15# 2x10 bil each Weight shifting to look behind to a spot on the wall- improved weight shift today  Sit to stand: 6# 2x10- did well with increased weight Step tap and step to with colored discs in a circle    PATIENT EDUCATION:  Education details: Access Code: [email or web address] Person educated: Patient Education method: Explanation, Demonstration, and Handouts Education comprehension: verbalized understanding and returned demonstration   HOME EXERCISE PROGRAM: Access Code: [email or web address] URL: [email or web address] Date: 09/27/2022 Prepared by: Tresa Endo  Exercises - Seated Long Arc Quad  - 3 x daily - 7 x weekly - 2 sets - 10 reps - 5 hold - Seated March   - 3 x daily - 7 x weekly - 3 sets - 10 reps - Seated Heel Toe Raises   - 3 x daily - 7 x weekly - 2 sets - 10 reps - Seated Isometric Hip Adduction with Ball  - 3 x daily - 7 x weekly - 2 sets - 10 reps - Sit to Stand with Armchair  - 2 x daily - 7 x weekly - 2 sets - 5-10 reps - Standing Hip Abduction with Counter Support  - 1 x daily - 7 x weekly - 2 sets - 10 reps - Heel Raises with Counter Support  - 2 x daily - 7 x weekly - 2 sets - 10 reps  Patient Education - Walking with a Single [employer] - Modified 4 Point Gait Pattern  ASSESSMENT:   CLINICAL IMPRESSION:     Today's treatment session focused on general strengthening and balance. Pt reported increased ease with sit to stand exercise today.  Pt was  challenged more with lifting Rt leg to clear hurdle vs stabilizing on Rt leg to lift Lt with hurdles today.  PT monitored throughout session.  Patient tolerated treatment session well.  Patient will benefit from skilled PT to address the below impairments and improve overall function.    OBJECTIVE IMPAIRMENTS: Abnormal gait, decreased activity tolerance, decreased balance, decreased mobility, difficulty walking, decreased strength, decreased safety awareness, impaired perceived functional ability, impaired flexibility, postural dysfunction, and pain.    ACTIVITY LIMITATIONS: carrying, lifting, sitting, standing, stairs, transfers, hygiene/grooming, and locomotion level   PARTICIPATION LIMITATIONS: meal prep, cleaning, laundry, driving, shopping, and community activity   PERSONAL FACTORS: Age, Past/current experiences, and 1-2 comorbidities: CVA, falls, Rt hip fracture with ORIF  are also affecting patient's functional outcome.    REHAB POTENTIAL: Good   CLINICAL DECISION MAKING: Evolving/moderate complexity   EVALUATION COMPLEXITY: Moderate     GOALS: Goals reviewed with patient? Yes   SHORT TERM GOALS: Target date: 09/06/2022   Be independent in initial HEP Baseline: Goal status: Goal met 08/14/22   2.  Improve LE strength to perform sit to stand with moderate Lt UE support Baseline:  Goal status: Goal met 08/28/22  3.  Perform TUG in < or = to 60 seconds to reduce falls risk Baseline: 27 seconds (09/27/22) Goal status: MET      LONG TERM GOALS: Target date:  07/20/23   Be independent in advanced HEP Baseline: independent in current HEP and has tolerated progress when appropriate (05/23/23)  Goal status: in progress    2.  Perform TUG in < or = to 13 seconds to reduce falls risk Baseline: 11 seconds without cane (05/23/23) Goal status: met   3.  ambulate > or = to 875 feet in 6 minutes to improve community ambulation and independence  Baseline: 763 (05/23/23) Goal  status: In progress      4.  perform 5x sit to stand in < or = to 12 seconds  to reduce falls risk  Baseline: 11.98 seconds (06/06/23)  Goal Status: In progress   8. Improve balance to walk her dog short distances with independence   Baseline: walking driveway with dog (05/23/23)  Goal status: in progress     PLAN:   PT FREQUENCY: 1x/week   PT DURATION: 10 weeks   PLANNED INTERVENTIONS: Therapeutic exercises, Therapeutic activity, Neuromuscular re-education, Balance training, Gait training, Patient/Family education, Self Care, Joint mobilization, Stair training, Dry Needling, Electrical stimulation, Cryotherapy, Moist heat, Taping, Manual therapy, and Re-evaluation   PLAN FOR NEXT SESSION: Continue to work on gait , strength and balance. Endurance, change of direction.     Lorrene Reid, PT 06/27/23 3:38 PM   [facility] [address], [address] [place], [place] [address] Phone # [phone] Fax [phone]

## 2023-06-29 NOTE — Progress Notes (Signed)
Kindly inform the patient that carotid ultrasound study shows no significant blockages of either carotid arteries in the neck.

## 2023-07-02 ENCOUNTER — Telehealth: Payer: Self-pay

## 2023-07-02 ENCOUNTER — Ambulatory Visit: Payer: Medicare PPO | Admitting: Occupational Therapy

## 2023-07-02 DIAGNOSIS — M6281 Muscle weakness (generalized): Secondary | ICD-10-CM

## 2023-07-02 DIAGNOSIS — I69351 Hemiplegia and hemiparesis following cerebral infarction affecting right dominant side: Secondary | ICD-10-CM

## 2023-07-02 DIAGNOSIS — M79601 Pain in right arm: Secondary | ICD-10-CM | POA: Diagnosis not present

## 2023-07-02 DIAGNOSIS — R278 Other lack of coordination: Secondary | ICD-10-CM | POA: Diagnosis not present

## 2023-07-02 DIAGNOSIS — R2689 Other abnormalities of gait and mobility: Secondary | ICD-10-CM | POA: Diagnosis not present

## 2023-07-02 DIAGNOSIS — M25611 Stiffness of right shoulder, not elsewhere classified: Secondary | ICD-10-CM | POA: Diagnosis not present

## 2023-07-02 LAB — CUP PACEART REMOTE DEVICE CHECK
Date Time Interrogation Session: 20240930022200
Implantable Pulse Generator Implant Date: 20230815
Pulse Gen Serial Number: 183424

## 2023-07-02 NOTE — Telephone Encounter (Signed)
-----   Message from Delia Heady sent at 06/29/2023  4:50 PM EDT ----- Joneen Roach inform the patient that carotid ultrasound study shows no significant blockages of either carotid arteries in the neck.

## 2023-07-02 NOTE — Therapy (Signed)
OUTPATIENT OCCUPATIONAL THERAPY NEURO  Treatment Note  Patient Name: Angela Buchanan MRN: [id number] DOB:07-28-1951, 72 y.o., female Today's Date: 07/02/2023  PCP: Shon Hale, MD REFERRING PROVIDER: Shon Hale, MD     END OF SESSION:  OT End of Session - 07/02/23 1647     Visit Number 16    Number of Visits 26    Date for OT Re-Evaluation 07/27/23    Authorization Type [employer]    OT Start Time 1535    OT Stop Time 1615    OT Time Calculation (min) 40 min    Behavior During Therapy [facility] for tasks assessed/performed                            Past Medical History:  Diagnosis Date   Atypical chest pain 07/19/2021   Bigeminy    no current med.   Breast cancer (HCC) 06/2017   right   Bruises easily    CAD in native artery 10/31/2022   Carotid bruit 11/13/2019   Dental crowns present    History of diverticulitis    Hypertension    states under control with med., has been on med. x 5 yr.   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   Past Surgical History:  Procedure Laterality Date   ABDOMINAL HYSTERECTOMY     partial   BREAST LUMPECTOMY Right 06/22/2017   Malignant   BREAST LUMPECTOMY WITH RADIOACTIVE SEED AND SENTINEL LYMPH NODE BIOPSY Right 06/22/2017   Procedure: RIGHT BREAST LUMPECTOMY WITH RADIOACTIVE SEED AND RIGHT AXILLARY DEEP SENTINEL LYMPH NODE BIOPSY WITH BLUE DYE INJECTION;  Surgeon: Claud Kelp, MD;  Location: [facility];  Service: General;  Laterality: Right;   BREAST LUMPECTOMY WITH RADIOACTIVE SEED LOCALIZATION Left 06/22/2017   Procedure: LEFT BREAST LUMPECTOMY WITH RADIOACTIVE SEED LOCALIZATION;  Surgeon: Claud Kelp, MD;  Location:  [facility];  Service: General;  Laterality: Left;   CATARACT EXTRACTION W/ INTRAOCULAR LENS  IMPLANT, BILATERAL Bilateral    EXCISION OF BREAST BIOPSY Left 06/22/2017   benign   LOOP RECORDER INSERTION N/A 05/16/2022   Procedure: LOOP RECORDER INSERTION;  Surgeon: Lanier Prude, MD;  Location: [facility];  Service: Cardiovascular;  Laterality: N/A;   PERCUTANEOUS PINNING Right 06/22/2022   Procedure: PERCUTANEOUS PINNING OF RIGHT HIP;  Surgeon: Bjorn Pippin, MD;  Location: [facility];  Service: Orthopedics;  Laterality: Right;   TONSILLECTOMY     age 21   Patient Active Problem List   Diagnosis Date Noted   Right spastic hemiplegia (HCC) 12/14/2022   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 05/11/2020   PVC (premature ventricular contraction) 05/11/2020   Ventricular  bigeminy 11/13/2019   Carotid bruit 11/13/2019   Pure hypercholesterolemia 11/13/2019   Malignant neoplasm of upper-outer quadrant of right breast in female, estrogen receptor positive (HCC) 05/22/2017   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   Diplopia 09/17/2014   Dizziness and giddiness 09/17/2014   Essential hypertension 07/22/2014   Chest pain 07/22/2014   Family history of heart disease 07/22/2014    ONSET DATE: 05/13/22 (new referral 04/09/23)  REFERRING DIAG: G83.23 (ICD-10-CM) - 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  THERAPY DIAG:  Muscle weakness (generalized)  Hemiplegia and hemiparesis following cerebral infarction affecting right dominant side (HCC)  Other lack of coordination  Stiffness of right shoulder, not elsewhere classified  Rationale for Evaluation and Treatment: Rehabilitation  SUBJECTIVE:   SUBJECTIVE STATEMENT: "I've been practicing, like this (demonstrating raising R arm overhead with LUE)." Pt accompanied by: self and significant other ([name])  PERTINENT HISTORY: hypertension, breast cancer, hyperlipidemia, Lt hip fracture secondary to fall 06/2022, CVA 05/2022    PRECAUTIONS: Fall, Rt breast cancer with node removal, osteoporosis   WEIGHT BEARING RESTRICTIONS: No  PAIN:  Are you having pain? No  FALLS: Has patient fallen in last 6 months? No  LIVING ENVIRONMENT: Lives with: lives with their spouse Lives in: House/apartment Stairs: Yes: External: 2 steps; on left going up Has following equipment at home: Quad cane large base, Environmental consultant - 2 wheeled, Hemi walker, Wheelchair (manual), shower chair, and Grab bars  PLOF: Independent and Independent with basic ADLs  PATIENT GOALS: to have better use my arm and hand  OBJECTIVE:   HAND DOMINANCE: Right  ADLs:  Transfers/ambulation related to ADLs: Utilizes [facility] for mobility in the community, 80% in the house but will walk without occasionally Eating: husband cuts foods, utilizing built up handles for self-feeding Grooming: needs assistance with washing hair UB Dressing: needs assistance with fastening bra LB Dressing: Mod-max assist for donning pants due to fear of falling, able to don socks and slip on shoes - unable to tie shoes Toileting: Mod I Bathing: Supervision Tub Shower transfers: Supervision, but able to complete transfers with use of grab bars Equipment: Shower seat with back, Grab bars, and Walk in shower  IADLs: Handwriting:  TBA at next session  MOBILITY STATUS: Needs Assist: utilized [facility] for  mobility and Hx of falls  POSTURE COMMENTS:  rounded shoulders and forward head  ACTIVITY TOLERANCE: Activity tolerance: fatigues with increased mobility and effort  FUNCTIONAL OUTCOME MEASURES: Upper Extremity Functional Scale (UEFS): TBD  UPPER EXTREMITY ROM:    Active ROM Right eval Right 05/31/23 Right 06/14/23  Shoulder flexion 87 93 98  Shoulder abduction 78 82 86  Shoulder adduction     Shoulder extension     Shoulder internal rotation 90% 100% 100%  Shoulder external rotation 50% 75% 95% (requires increased time)  Elbow flexion 132 137 138  Elbow extension -17 -12 -12  Wrist flexion 19 40 48  Wrist extension 35 35 40  Wrist ulnar deviation 15 10 15   Wrist radial deviation 13 15 15   Wrist pronation WNL  WNL  Wrist supination 20 from neutral 65* 65  (Blank rows = not tested)  UPPER EXTREMITY MMT:     MMT Right eval Left eval  Shoulder flexion    Shoulder abduction    Shoulder adduction    Shoulder extension    Shoulder internal rotation    Shoulder external rotation    Middle trapezius    Lower trapezius    Elbow flexion  Elbow extension    Wrist flexion    Wrist extension    Wrist ulnar deviation    Wrist radial deviation    Wrist pronation    Wrist supination    (Blank rows = not tested)    Active ROM Extension (as flexion to loose gross grasp) Right eval Right 05/31/23 Right 06/14/23  Thumb MCP (0-60)     Thumb IP (0-80)     Thumb Radial abd/add (0-55)     Thumb Palmar abd/add (0-45)     Thumb opposition to index     Index MCP (0-90) 0  0  Index PIP (0-100) -30 -50 -22  Index DIP (0-70) -40 -35 -35  Long MCP (0-90) -5 0 0  Long PIP (0-100) -45 -35 -40  Long DIP (0-70) -5 -25 -15  Ring MCP (0-90_ 0 0   Ring PIP (0-100) -40 -40   Ring DIP (0-70) -20 -20   Little MCP (0-90) -7 0   Little PIP (0-100) -25 -25   Little DIP (0-70) -5 0   (Blank rows = not tested)    HAND FUNCTION: 05/31/23 Grip strength: Right: 9#  Grip strength:  Right: 7,9 = 8# lbs; Left: 30 lbs  COORDINATION: 05/31/23 9 Hole Peg test: Right: able to pick up one peg with thumb and long finger, however unable to rotate to place into peg board Box and Blocks:  Right 15 blocks  Finger Nose Finger test: slow and deliberate with RUE, but able to touch both nose and finger at this time 9 Hole Peg test: Right: unable to pick up any pegs with RUE  Box and Blocks:  Right 16 blocks, Left 55 blocks  SENSATION: Light touch: WFL Hot/Cold: WFL  EDEMA: no swelling on eval  MUSCLE TONE: RUE: Mild and Hypertonic  COGNITION: Overall cognitive status: Within functional limits for tasks assessed  VISION: Subjective report: pt reports wearing glasses more since CVA Baseline vision: Wears glasses all the time, double vision after cataract removal Visual history: cataracts  OBSERVATIONS: Pt attempting grasp and pinch activities, frequently not incorporating index finger on R with increased flexion away from task.   TODAY'S TREATMENT:                                                           07/02/23 Functional grasp: engaged in tasks incorporating pinch with picking up and stacking blocks, shoulder ROM with placing items into vertical container, and increased shoulder abduction and extension with picking up items from various planes to facilitate increased ROM and then placing items into container.  Pt continues to utilize gross grasp with decreased index finger engagement. PROM: OT providing facilitation for increased forearm supination, providing tactile and verbal cues for increased ROM progressing to AAROM.  Pt demonstrating min supination, utilizing L hand to further facilitate increased ROM. Engaged in PROM with ulnar/radial deviation with pt able to facilitate movement with only cues and use of LUE for increased ROM. Coordination: engaged in flipping cards with focus on grasp, wrist mobility, and supination.  Pt continues to utilize compensatory movements  due to decreased ulnar deviation and supination. Rotation: attempted rotating ball on table top to simulate turning water faucet on/off.  Pt continues to demonstrate difficulty with wrist ulnar/radial deviation.     06/26/23 Moist heat: applied  moist heat to R hand with focus on facilitating relaxation in digits prior to engaging in reach and coordination. PROM: OT providing PROM and stretch to index and long finger with focus on extension at PIP and DIP.  Pt able to achieve full extension passively, but not actively UE NMR: engaged in shoulder flexion, elbow extension, and grasp with use of rainbow arc.  Pt demonstrating loose gross grasp with decreased use of index finger but able to sustain grasp and move rings from one side to the other of arc demonstrating improved shoulder flexion and horizontal adduction.   Coordination: rotating large dice in finger tips to select given number.  Pt demonstrating use of forearm supination and pronation to attempt to rotate, pt unable to coordinate index and long finger to rotate dice in finger tips.  Transitioned to picking up and stacking with focus on incorporating use of index finger.  Pt dropping dice 20% of time.  Transitioned to placing items into tall cup to facilitate increased reach and shoulder flexion. Kinesiotape: applied kinesiotape to R hand and digits in retrograde fashion to facilitate increased passive extension while still allowing pt to engage in grasp and release exercises.   06/14/23 Self-care: educated on use of RUE during self-care tasks (ie washing hair, putting on glasses, etc.).  OT reinforced education on use of RUE even with hand over hand during aspects of bathing and dressing to continue to incorporate during functional tasks as pt with increased ROM with shoulder flexion, internal and external rotation.  Reiterated functional reach, providing either target and/or functional task to facilitate increased and consistent ROM. Box and  blocks: R: 18 blocks - however pt completing 8 in 20 sec and 11 in 30 sec, therefore really demonstrating decrease due to fatigue with increased shoulder ROM. Handwriting: Pt able to write name with built up handle, still requiring significant effort and decreased legibility.   PATIENT EDUCATION: Education details: ongoing condition specific education. Person educated: Patient and Spouse Education method: Explanation, Demonstration, and Verbal cues Education comprehension: verbalized understanding and needs further education  HOME EXERCISE PROGRAM: Access Code: [email or web address] URL: [email or web address] Date: 04/23/2023 Prepared by: [facility] - [facility]    GOALS: Goals reviewed with patient? Yes  SHORT TERM GOALS: Target date: 07/06/23  Pt and spouse will be independent with advanced HEP for ROM, strengthening, and coordination.  Baseline: Goal status: IN PROGRESS  2.  Pt will verbalize understanding of task modifications and/or potential AE needs to increase ease, safety, and independence w/ ADLs.  Baseline: Goal status: IN PROGRESS  LONG TERM GOALS: Target date: 07/27/23  Pt will demonstrate improved functional grasp in RUE by 5# to open new jar, increased handwriting, and self-feeding.  Baseline: R: 9# on 05/31/23 Goal status: IN PROGRESS  2.  Pt will demonstrate improved UE functional use for ADLs as evidenced by increasing box/ blocks score by 4 blocks with RUE. Baseline: R: 18 blocks on 06/14/23 Goal status: IN PROGRESS  3.  Pt will demonstrate improved shoulder flexion as needed for IADLs (to place a dish in the cabinet and/or laundry on a shelf at eye level) Baseline:  Goal status: IN PROGRESS  4.  Pt will demonstrate improvement of finger extension by by 10* TAM of index and 15* TAM of long ringer finger to allow for more functional hand opening.  Baseline: -57 IF on 06/14/23 , -55 LF Goal status: IN PROGRESS  5.  Pt will  demonstrate improved handwriting with ability to  write 10 item simple grocery list with 90% legibility. Baseline:  Goal status: IN PROGRESS  6.  Pt will demonstrate improvements in functional use of dominant RUE as evidenced by improvements on Myrtis Ser and Allie Bossier Dickie La assessments. Baseline: TBD Goal status: IN PROGRESS  ASSESSMENT:  CLINICAL IMPRESSION: Pt demonstrating increased shoulder mobility with flexion with reaching into multiple planes and functional reach.  Pt continues to utilize compensatory movements due to decreased wrist and forearm mobility.  PERFORMANCE DEFICITS: in functional skills including ADLs, IADLs, coordination, dexterity, tone, ROM, strength, pain, flexibility, Fine motor control, Gross motor control, mobility, balance, body mechanics, endurance, decreased knowledge of precautions, decreased knowledge of use of DME, and UE functional use and psychosocial skills including environmental adaptation and routines and behaviors.   IMPAIRMENTS: are limiting patient from ADLs and IADLs.   CO-MORBIDITIES: may have co-morbidities  that affects occupational performance. Patient will benefit from skilled OT to address above impairments and improve overall function.  MODIFICATION OR ASSISTANCE TO COMPLETE EVALUATION: Min-Moderate modification of tasks or assist with assess necessary to complete an evaluation.  OT OCCUPATIONAL PROFILE AND HISTORY: Detailed assessment: Review of records and additional review of physical, cognitive, psychosocial history related to current functional performance.  CLINICAL DECISION MAKING: Moderate - several treatment options, min-mod task modification necessary  REHAB POTENTIAL: Good  EVALUATION COMPLEXITY: Moderate    PLAN:  OT FREQUENCY: 2x/week  OT DURATION: 6 weeks  PLANNED INTERVENTIONS: self care/ADL training, therapeutic exercise, therapeutic activity, neuromuscular re-education, manual therapy, passive range of motion, functional  mobility training, splinting, electrical stimulation, ultrasound, compression bandaging, moist heat, cryotherapy, patient/family education, psychosocial skills training, energy conservation, coping strategies training, and DME and/or AE instructions  RECOMMENDED OTHER SERVICES: NA  CONSULTED AND AGREED WITH PLAN OF CARE: Patient and family member/caregiver  PLAN FOR NEXT SESSION: Engage in shoulder flexion in supine, sitting,and standing to facilitate increased strength and endurance with ROM, continue with gross grasp tasks, further address wrist flexion/extension and ulnar/radial deviation, supination, and finger flexion and extension.     Rosalio Loud, OTR/L 07/02/2023, 4:48 PM  [facility] at [facility] [place] [facility] [address] [place], [address] [place], [place] [address] Phone # [phone] Fax # [phone]

## 2023-07-02 NOTE — Telephone Encounter (Signed)
Called patient and informed her Per Dr. Pearlean Brownie "Kindly inform the patient that carotid ultrasound study shows no significant blockages of either carotid arteries in the neck." Pt verbalized understanding. Pt had no questions at this time but was encouraged to call back if questions arise.

## 2023-07-04 ENCOUNTER — Ambulatory Visit: Payer: Medicare PPO | Attending: Family Medicine

## 2023-07-04 DIAGNOSIS — R2689 Other abnormalities of gait and mobility: Secondary | ICD-10-CM | POA: Diagnosis not present

## 2023-07-04 DIAGNOSIS — R293 Abnormal posture: Secondary | ICD-10-CM | POA: Insufficient documentation

## 2023-07-04 DIAGNOSIS — I69351 Hemiplegia and hemiparesis following cerebral infarction affecting right dominant side: Secondary | ICD-10-CM | POA: Insufficient documentation

## 2023-07-04 DIAGNOSIS — M6281 Muscle weakness (generalized): Secondary | ICD-10-CM | POA: Insufficient documentation

## 2023-07-04 DIAGNOSIS — R2681 Unsteadiness on feet: Secondary | ICD-10-CM | POA: Insufficient documentation

## 2023-07-04 NOTE — Therapy (Signed)
OUTPATIENT PHYSICAL THERAPY TREATMENT NOTE   Patient Name: Angela Buchanan MRN: [id number] DOB:[birth year], 72 y.o., female Today's Date: 07/04/2023  PCP:  Garth Bigness, MD   REFERRING PROVIDER:  Garth Bigness, MD       END OF SESSION:   PT End of Session - 07/04/23 1530     Visit Number 43    Date for PT Re-Evaluation 07/20/23    Authorization Type [employer]    Authorization Time Period 8 visits 8/22-10/18/24    Authorization - Visit Number 5    Authorization - Number of Visits 8    Progress Note Due on Visit 50    PT Start Time 1448    PT Stop Time 1531    PT Time Calculation (min) 43 min    Equipment Utilized During Treatment Gait belt    Activity Tolerance Patient tolerated treatment well    Behavior During Therapy WFL for tasks assessed/performed                       Past Medical History:  Diagnosis Date   Atypical chest pain 07/19/2021   Bigeminy    no current med.   Breast cancer (HCC) 06/2017   right   Bruises easily    CAD in native artery 10/31/2022   Carotid bruit 11/13/2019   Dental crowns present    History of diverticulitis    Hypertension    states under control with med., has been on med. x 5 yr.   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   Past Surgical History:  Procedure Laterality Date   ABDOMINAL HYSTERECTOMY     partial   BREAST LUMPECTOMY Right 06/22/2017   Malignant   BREAST LUMPECTOMY WITH RADIOACTIVE SEED AND SENTINEL LYMPH NODE BIOPSY Right 06/22/2017   Procedure: RIGHT BREAST LUMPECTOMY WITH RADIOACTIVE SEED AND RIGHT AXILLARY DEEP SENTINEL LYMPH NODE BIOPSY WITH BLUE DYE INJECTION;  Surgeon: Claud Kelp, MD;  Location: [facility];   Service: General;  Laterality: Right;   BREAST LUMPECTOMY WITH RADIOACTIVE SEED LOCALIZATION Left 06/22/2017   Procedure: LEFT BREAST LUMPECTOMY WITH RADIOACTIVE SEED LOCALIZATION;  Surgeon: Claud Kelp, MD;  Location:  SURGERY CENTER;  Service: General;  Laterality: Left;   CATARACT EXTRACTION W/ INTRAOCULAR LENS  IMPLANT, BILATERAL Bilateral    EXCISION OF BREAST BIOPSY Left 06/22/2017   benign   LOOP RECORDER INSERTION N/A 05/16/2022   Procedure: LOOP RECORDER INSERTION;  Surgeon: Lanier Prude, MD;  Location: [facility];  Service: Cardiovascular;  Laterality: N/A;   PERCUTANEOUS PINNING Right 06/22/2022   Procedure: PERCUTANEOUS PINNING OF RIGHT HIP;  Surgeon: Bjorn Pippin, MD;  Location: [facility];  Service: Orthopedics;  Laterality: Right;   TONSILLECTOMY     age 38   Patient Active Problem List   Diagnosis Date Noted   Right spastic hemiplegia (HCC) 12/14/2022   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   CAD in native artery 10/31/2022   Malnutrition of moderate degree 06/22/2022   Closed fracture of femur, neck (HCC) 06/21/2022   Urinary frequency 06/21/2022   Hypokalemia 06/21/2022   Heart murmur 06/21/2022   Hypothyroidism 06/21/2022   Dysphagia 05/17/2022   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   CVA (cerebral vascular  accident) (HCC) 05/12/2022   Osteoporosis 01/24/2022   Atypical chest pain 07/19/2021   Temporomandibular joint (TMJ) pain 07/16/2020   Referred otalgia of both ears 07/16/2020   PAC (premature atrial contraction) 05/11/2020   PVC (premature ventricular contraction) 05/11/2020   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   Carotid bruit 11/13/2019   Pure hypercholesterolemia 11/13/2019   Malignant neoplasm of upper-outer quadrant of right breast in female, estrogen receptor positive (HCC) 05/22/2017   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   Diplopia 09/17/2014   Dizziness and giddiness 09/17/2014   Essential hypertension  07/22/2014   Chest pain 07/22/2014   Family history of heart disease 07/22/2014    REFERRING DIAG:  R29.6 (ICD-10-CM) - Repeated falls, closed fracture of Rt hip    THERAPY DIAG:  Muscle weakness (generalized)  Hemiplegia and hemiparesis following cerebral infarction affecting right dominant side (HCC)  Other abnormalities of gait and mobility  Abnormal posture  Rationale for Evaluation and Treatment Rehabilitation  PERTINENT HISTORY: hypertension, breast cancer, hyperlipidemia, Lt hip fracture secondary to fall 06/2022, CVA 06/2022   PRECAUTIONS:  Fall, Rt breast cancer with node removal, osteoporosis    SUBJECTIVE:                                                                                                                                                                                      SUBJECTIVE STATEMENT:  I am doing good today. I am not having any pain anywhere. I had to turn at my counter and I did not lose my balance.   Pt spouse present during visit.   PAIN:  PAIN:  Are you having pain? Yes NPRS scale: 0/10 Pain location: Right shoulder PAIN TYPE: aching Aggravating factors: use Relieving factors: Medication    OBJECTIVE: (objective measures completed at initial evaluation unless otherwise dated)  DIAGNOSTIC FINDINGS: Rt hip fracture and ORIF    COGNITION: Overall cognitive status: Within functional limits for tasks assessed                         SENSATION: WFL  POSTURE: rounded shoulders, forward head, flexed trunk , and weight shift left   PALPATION: NA   LOWER EXTREMITY ROM: Rt hip limited by 50%, hamstrings limited by 50% bil.    LOWER EXTREMITY MMT:   MMT Right eval Right  09/27/22 Left eval Right 10/30/22 Right  01/17/23 Right 05/23/23 Left 10/30/22 Left 01/17/23  Hip flexion 3+ 4- 4- 4- 4 4 4- 4+  Hip extension            Hip abduction 3-  4     4  Hip adduction  Hip internal rotation            Hip external  rotation            Knee flexion 3- 4- 3+  4 4  4+  Knee extension 3+ 4 4- 4 painful hip 4+ 4+ 4- 4+  Ankle dorsiflexion 3+ 4- 4- 4-   4   Ankle plantarflexion            Ankle inversion            Ankle eversion             (Blank rows = not tested)   FUNCTIONAL TESTS:   Eval: Timed up and go (TUG): 1 min, 27 seconds   09/27/22: 5x sit to stand: 29 seconds with use of Lt hand  TUG: 27 seconds   10/30/22: 5x sit to stand: 23 sec use of LT hand     TUG: 26 sec with standard cane   11/20/22: 5x sit to stand: 18.68 seconds    TUG: 19.45 without device    3 minute walk test: 225 feet with a standard cane   01/10/23: 5x sit to stand: 15.77 seconds   01/17/23: TUG with cane: 15.41 seconds with cane    3 min walk test: 313 feet with cane    03/07/23: 3 min walk 275 feet with single point cane   TUG: 16 seconds   03/14/23: 6 min walk test: 723 feet with standard cane    TUG: 14.16 without cane    5x sit to stand: 12.15 seconds without hand   03/28/23 TUG 11 seconds without cane  5x STS 10.6 seconds no Ues  04/18/2023: 6 min walk test:  532 ft with [facility]  05/09/23: 6 min walk test with SPC: 667 feet - RPE 5  05/23/23: TUG: 11 seconds without cane 5x sit to stand: 12.71 seconds  6 min walk test: 763 feet  06/06/23:  5x sit to stand: 11.98 seconds    07/04/23: 6 min walk test:  TODAY'S TREATMENT:   DATE:  07/04/23 Nustep level 3 x10 minutes with LE only. >55 spm PT present throughout to discuss status Farmers carry 6# Lt UE from gym mat table to mirror 2 laps Alternating step tap on 6" step 2x10 with CGA Leg Press (seat at 5) 55# 2x10, single leg: Lt 35#, Rt 15# 2x10 bil each Sit to stand: 6# 2x10 Step to over hurdles with hand held assist on the Lt: Lt lead and Rt lead with verbal cues for step height Ambulation around clinic for 4 minutes using a straight cane.   DATE:  06/27/2023 Nustep level 3 x10 minutes with LE only. >55 spm PT present throughout to discuss status Farmers  carry 6# Lt UE from gym mat table to mirror 2 laps Alternating step tap on 6" step 2x10 with CGA Leg Press (seat at 5) 55# 2x10, single leg: Lt 35#, Rt 15# 2x10 bil each Sit to stand: 6# 2x10 Step to over hurdles with hand held assist on the Lt: Lt lead and Rt lead with verbal cues for step height Ambulation around clinic for 4 minutes using a straight cane.   DATE:  06/20/2023 Nustep level 3 x10 minutes with LE only. >55 spm PT present throughout to discuss status Farmers carry 6# Lt UE from gym mat table to mirror 2 laps Alternating step tap on 6" step 2x10 with CGA Leg Press (seat at 5) 55# 2x10, single leg: Lt 35#, Rt  15# 2x10 bil each Sit to stand: 6# 2x10 Step tap and step to with colored discs in a circle Ambulation around clinic for 4 minutes using a straight cane.    PATIENT EDUCATION:  Education details: Access Code: [email or web address] Person educated: Patient Education method: Explanation, Facilities manager, and Handouts Education comprehension: verbalized understanding and returned demonstration   HOME EXERCISE PROGRAM: Access Code: [email or web address] URL: [email or web address] Date: 09/27/2022 Prepared by: Tresa Endo  Exercises - Seated Long Arc Quad  - 3 x daily - 7 x weekly - 2 sets - 10 reps - 5 hold - Seated March   - 3 x daily - 7 x weekly - 3 sets - 10 reps - Seated Heel Toe Raises   - 3 x daily - 7 x weekly - 2 sets - 10 reps - Seated Isometric Hip Adduction with Ball  - 3 x daily - 7 x weekly - 2 sets - 10 reps - Sit to Stand with Armchair  - 2 x daily - 7 x weekly - 2 sets - 5-10 reps - Standing Hip Abduction with Counter Support  - 1 x daily - 7 x weekly - 2 sets - 10 reps - Heel Raises with Counter Support  - 2 x daily - 7 x weekly - 2 sets - 10 reps  Patient Education - Walking with a Single [employer] - Modified 4 Point Gait Pattern  ASSESSMENT:   CLINICAL IMPRESSION:     Today's treatment session focused on general strengthening and balance. Pt demonstrates  improved ease and control with sit to stand with weight.  Less supervision and guarding required by PT due to improved stability overall.   Pt was challenged more with lifting Rt leg to clear hurdle vs stabilizing on Rt leg to lift Lt with hurdles today.  PT monitored throughout session.  Patient tolerated treatment session well.  Patient will benefit from skilled PT to address the below impairments and improve overall function.    OBJECTIVE IMPAIRMENTS: Abnormal gait, decreased activity tolerance, decreased balance, decreased mobility, difficulty walking, decreased strength, decreased safety awareness, impaired perceived functional ability, impaired flexibility, postural dysfunction, and pain.    ACTIVITY LIMITATIONS: carrying, lifting, sitting, standing, stairs, transfers, hygiene/grooming, and locomotion level   PARTICIPATION LIMITATIONS: meal prep, cleaning, laundry, driving, shopping, and community activity   PERSONAL FACTORS: Age, Past/current experiences, and 1-2 comorbidities: CVA, falls, Rt hip fracture with ORIF  are also affecting patient's functional outcome.    REHAB POTENTIAL: Good   CLINICAL DECISION MAKING: Evolving/moderate complexity   EVALUATION COMPLEXITY: Moderate     GOALS: Goals reviewed with patient? Yes   SHORT TERM GOALS: Target date: 09/06/2022   Be independent in initial HEP Baseline: Goal status: Goal met 08/14/22   2.  Improve LE strength to perform sit to stand with moderate Lt UE support Baseline:  Goal status: Goal met 08/28/22  3.  Perform TUG in < or = to 60 seconds to reduce falls risk Baseline: 27 seconds (09/27/22) Goal status: MET      LONG TERM GOALS: Target date:  07/20/23   Be independent in advanced HEP Baseline: independent in current HEP and has tolerated progress when appropriate (05/23/23)  Goal status: in progress    2.  Perform TUG in < or = to 13 seconds to reduce falls risk Baseline: 11 seconds without cane (05/23/23) Goal  status: met   3.  ambulate > or = to 875 feet in 6 minutes to improve community ambulation and independence  Baseline:  763 (05/23/23) Goal status: In progress      4.  perform 5x sit to stand in < or = to 12 seconds to reduce falls risk  Baseline: 11.98 seconds (06/06/23)  Goal Status: In progress   8. Improve balance to walk her dog short distances with independence   Baseline: walking driveway with dog (05/23/23)  Goal status: in progress     PLAN:   PT FREQUENCY: 1x/week   PT DURATION: 10 weeks   PLANNED INTERVENTIONS: Therapeutic exercises, Therapeutic activity, Neuromuscular re-education, Balance training, Gait training, Patient/Family education, Self Care, Joint mobilization, Stair training, Dry Needling, Electrical stimulation, Cryotherapy, Moist heat, Taping, Manual therapy, and Re-evaluation   PLAN FOR NEXT SESSION: Continue to work on gait , strength and balance. Endurance, change of direction.  Short Physical performance battery test.   Lorrene Reid, PT 07/04/23 3:32 PM   [facility] [address], [address] [place], [place] [address] Phone # [phone] Fax [phone]

## 2023-07-05 ENCOUNTER — Ambulatory Visit: Payer: Medicare PPO | Attending: Family Medicine | Admitting: Occupational Therapy

## 2023-07-05 DIAGNOSIS — R278 Other lack of coordination: Secondary | ICD-10-CM | POA: Insufficient documentation

## 2023-07-05 DIAGNOSIS — M25611 Stiffness of right shoulder, not elsewhere classified: Secondary | ICD-10-CM | POA: Diagnosis not present

## 2023-07-05 DIAGNOSIS — M6281 Muscle weakness (generalized): Secondary | ICD-10-CM | POA: Diagnosis not present

## 2023-07-05 DIAGNOSIS — M79601 Pain in right arm: Secondary | ICD-10-CM | POA: Insufficient documentation

## 2023-07-05 DIAGNOSIS — I69351 Hemiplegia and hemiparesis following cerebral infarction affecting right dominant side: Secondary | ICD-10-CM | POA: Diagnosis not present

## 2023-07-05 NOTE — Therapy (Signed)
OUTPATIENT OCCUPATIONAL THERAPY NEURO  Treatment Note  Patient Name: Angela Buchanan MRN: [id number] DOB:30-Sep-1951, 72 y.o., female Today's Date: 07/05/2023  PCP: Shon Hale, MD REFERRING PROVIDER: Shon Hale, MD     END OF SESSION:  OT End of Session - 07/05/23 1404     Visit Number 17    Number of Visits 26    Date for OT Re-Evaluation 07/27/23    Authorization Type [employer]    OT Start Time 1402    OT Stop Time 1444    OT Time Calculation (min) 42 min    Behavior During Therapy [facility] for tasks assessed/performed                             Past Medical History:  Diagnosis Date   Atypical chest pain 07/19/2021   Bigeminy    no current med.   Breast cancer (HCC) 06/2017   right   Bruises easily    CAD in native artery 10/31/2022   Carotid bruit 11/13/2019   Dental crowns present    History of diverticulitis    Hypertension    states under control with med., has been on med. x 5 yr.   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   Past Surgical History:  Procedure Laterality Date   ABDOMINAL HYSTERECTOMY     partial   BREAST LUMPECTOMY Right 06/22/2017   Malignant   BREAST LUMPECTOMY WITH RADIOACTIVE SEED AND SENTINEL LYMPH NODE BIOPSY Right 06/22/2017   Procedure: RIGHT BREAST LUMPECTOMY WITH RADIOACTIVE SEED AND RIGHT AXILLARY DEEP SENTINEL LYMPH NODE BIOPSY WITH BLUE DYE INJECTION;  Surgeon: Claud Kelp, MD;  Location: [facility];  Service: General;  Laterality: Right;   BREAST LUMPECTOMY WITH RADIOACTIVE SEED LOCALIZATION Left 06/22/2017   Procedure: LEFT BREAST LUMPECTOMY WITH RADIOACTIVE SEED LOCALIZATION;  Surgeon: Claud Kelp, MD;  Location:  [facility];  Service: General;  Laterality: Left;   CATARACT EXTRACTION W/ INTRAOCULAR LENS  IMPLANT, BILATERAL Bilateral    EXCISION OF BREAST BIOPSY Left 06/22/2017   benign   LOOP RECORDER INSERTION N/A 05/16/2022   Procedure: LOOP RECORDER INSERTION;  Surgeon: Lanier Prude, MD;  Location: [facility];  Service: Cardiovascular;  Laterality: N/A;   PERCUTANEOUS PINNING Right 06/22/2022   Procedure: PERCUTANEOUS PINNING OF RIGHT HIP;  Surgeon: Bjorn Pippin, MD;  Location: [facility];  Service: Orthopedics;  Laterality: Right;   TONSILLECTOMY     age 89   Patient Active Problem List   Diagnosis Date Noted   Right spastic hemiplegia (HCC) 12/14/2022   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 05/11/2020   PVC (premature ventricular contraction) 05/11/2020  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   Carotid bruit 11/13/2019   Pure hypercholesterolemia 11/13/2019   Malignant neoplasm of upper-outer quadrant of right breast in female, estrogen receptor positive (HCC) 05/22/2017   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   Diplopia 09/17/2014   Dizziness and giddiness 09/17/2014   Essential hypertension 07/22/2014   Chest pain 07/22/2014   Family history of heart disease 07/22/2014    ONSET DATE: 05/13/22 (new referral 04/09/23)  REFERRING DIAG: G83.23 (ICD-10-CM) - 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 Pt accompanied by: self and significant other ([name])  PERTINENT HISTORY: hypertension, breast cancer, hyperlipidemia, Lt hip fracture secondary to fall 06/2022, CVA 05/2022    PRECAUTIONS: Fall, Rt breast cancer with node removal, osteoporosis   WEIGHT BEARING RESTRICTIONS: No  PAIN:  Are you having pain? No  FALLS: Has patient fallen in last 6 months? No  LIVING ENVIRONMENT: Lives with: lives with their spouse Lives in: House/apartment Stairs: Yes: External: 2 steps; on left going up Has following equipment at home: Quad cane large base, Environmental consultant - 2 wheeled, Hemi walker, Wheelchair (manual), shower chair, and Grab bars  PLOF: Independent and Independent with basic ADLs  PATIENT GOALS: to have better use my arm and hand  OBJECTIVE:   HAND DOMINANCE: Right  ADLs:  Transfers/ambulation related to ADLs: Utilizes [facility] for mobility in the community, 80% in the house but will walk without occasionally Eating: husband cuts foods, utilizing built up handles for self-feeding Grooming: needs assistance with washing hair UB Dressing: needs assistance with fastening bra LB Dressing: Mod-max assist for donning pants due to fear of falling, able to don socks and slip on shoes - unable to tie shoes Toileting: Mod I Bathing: Supervision Tub Shower transfers: Supervision, but able to complete transfers with use of grab bars Equipment: Shower seat with back, Grab bars, and Walk in shower  IADLs: Handwriting:  TBA at next session  MOBILITY STATUS: Needs Assist: utilized [facility] for mobility and Hx of falls  POSTURE COMMENTS:  rounded shoulders and  forward head  ACTIVITY TOLERANCE: Activity tolerance: fatigues with increased mobility and effort  FUNCTIONAL OUTCOME MEASURES: Upper Extremity Functional Scale (UEFS): TBD  UPPER EXTREMITY ROM:    Active ROM Right eval Right 05/31/23 Right 06/14/23  Shoulder flexion 87 93 98  Shoulder abduction 78 82 86  Shoulder adduction     Shoulder extension     Shoulder internal rotation 90% 100% 100%  Shoulder external rotation 50% 75% 95% (requires increased time)  Elbow flexion 132 137 138  Elbow extension -17 -12 -12  Wrist flexion 19 40 48  Wrist extension 35 35 40  Wrist ulnar deviation 15 10 15   Wrist radial deviation 13 15 15   Wrist pronation WNL  WNL  Wrist supination 20 from neutral 65* 65  (Blank rows = not tested)  UPPER EXTREMITY MMT:     MMT Right eval Left eval  Shoulder flexion    Shoulder abduction    Shoulder adduction    Shoulder extension    Shoulder internal rotation    Shoulder external rotation    Middle trapezius    Lower trapezius    Elbow flexion    Elbow extension  Wrist flexion    Wrist extension    Wrist ulnar deviation    Wrist radial deviation    Wrist pronation    Wrist supination    (Blank rows = not tested)    Active ROM Extension (as flexion to loose gross grasp) Right eval Right 05/31/23 Right 06/14/23  Thumb MCP (0-60)     Thumb IP (0-80)     Thumb Radial abd/add (0-55)     Thumb Palmar abd/add (0-45)     Thumb opposition to index     Index MCP (0-90) 0  0  Index PIP (0-100) -30 -50 -22  Index DIP (0-70) -40 -35 -35  Long MCP (0-90) -5 0 0  Long PIP (0-100) -45 -35 -40  Long DIP (0-70) -5 -25 -15  Ring MCP (0-90_ 0 0   Ring PIP (0-100) -40 -40   Ring DIP (0-70) -20 -20   Little MCP (0-90) -7 0   Little PIP (0-100) -25 -25   Little DIP (0-70) -5 0   (Blank rows = not tested)    HAND FUNCTION: 05/31/23 Grip strength: Right: 9#  Grip strength: Right: 7,9 = 8# lbs; Left: 30 lbs  COORDINATION: 05/31/23 9 Hole Peg  test: Right: able to pick up one peg with thumb and long finger, however unable to rotate to place into peg board Box and Blocks:  Right 15 blocks  Finger Nose Finger test: slow and deliberate with RUE, but able to touch both nose and finger at this time 9 Hole Peg test: Right: unable to pick up any pegs with RUE  Box and Blocks:  Right 16 blocks, Left 55 blocks  SENSATION: Light touch: WFL Hot/Cold: WFL  EDEMA: no swelling on eval  MUSCLE TONE: RUE: Mild and Hypertonic  COGNITION: Overall cognitive status: Within functional limits for tasks assessed  VISION: Subjective report: pt reports wearing glasses more since CVA Baseline vision: Wears glasses all the time, double vision after cataract removal Visual history: cataracts  OBSERVATIONS: Pt attempting grasp and pinch activities, frequently not incorporating index finger on R with increased flexion away from task.   TODAY'S TREATMENT:                                                           07/05/23 Vivistim: engaged in discussion about vivistim with pt reporting that she has appt with PM&R regarding possibility.  Pt still with questions and concerns.  Plan to complete fugyl-meyer assessment at next visit. Flex bar: utilized red flexbar with focus on wrist flexion/extension and ulnar/radial deviation. OT hand over hand to facilitate proper positioning and increased ROM with ulnar/radial deviation.  Pt demonstrating decreased strength and sustained grasp especially with radial deviation.   OT down graded task to completing with towel.  Engaged in rotation to facilitate wrist flexion/extension and ulnar/radial deviation to facilitate ROM without resistance.  Pt with increased tolerance and ROM with removal of resistance.   Golda Acre: IADL Scale  A. Ability to Use Telephone  Operates telephone on own initiative-looks up and dials numbers, etc. - 1  B. Shopping Needs to be accompanied on any shopping trip - 0  C. Food  Preparation Needs to have meals prepared and served - 0   D. Housekeeping Performs light daily tasks such as dish washing, bed  making - 1  E. Laundry  Does Engineer, manufacturing - 1  F. Mode of Transportation  Travel limited to taxi or automobile with assistance of another - 0  G. Responsibility for Own Medications Is responsible for taking medication in correct dosages at correct time - 1  H. Ability to [employer] of handling money - 0   TOTAL LAWTON BRODY SCORE: 4/8  -----------------------------------------------------------  Myrtis Ser Index of Independence in ADL INDEPENDENCE (1) DEPENDENCE (0)  NO supervision, direction or personal assistance WITH supervision, direction, personal assistance or total care   ACTIVITIES 1 or 0  Bathing 1  Dressing 0  Toileting 1  Transferring 1  Continence 1  Feeding 1    KATZ TOTAL: 5/6      07/02/23 Functional grasp: engaged in tasks incorporating pinch with picking up and stacking blocks, shoulder ROM with placing items into vertical container, and increased shoulder abduction and extension with picking up items from various planes to facilitate increased ROM and then placing items into container.  Pt continues to utilize gross grasp with decreased index finger engagement. PROM: OT providing facilitation for increased forearm supination, providing tactile and verbal cues for increased ROM progressing to AAROM.  Pt demonstrating min supination, utilizing L hand to further facilitate increased ROM. Engaged in PROM with ulnar/radial deviation with pt able to facilitate movement with only cues and use of LUE for increased ROM. Coordination: engaged in flipping cards with focus on grasp, wrist mobility, and supination.  Pt continues to utilize compensatory movements due to decreased ulnar deviation and supination. Rotation: attempted rotating ball on table top to simulate turning water faucet on/off.  Pt continues to demonstrate  difficulty with wrist ulnar/radial deviation.     06/26/23 Moist heat: applied moist heat to R hand with focus on facilitating relaxation in digits prior to engaging in reach and coordination. PROM: OT providing PROM and stretch to index and long finger with focus on extension at PIP and DIP.  Pt able to achieve full extension passively, but not actively UE NMR: engaged in shoulder flexion, elbow extension, and grasp with use of rainbow arc.  Pt demonstrating loose gross grasp with decreased use of index finger but able to sustain grasp and move rings from one side to the other of arc demonstrating improved shoulder flexion and horizontal adduction.   Coordination: rotating large dice in finger tips to select given number.  Pt demonstrating use of forearm supination and pronation to attempt to rotate, pt unable to coordinate index and long finger to rotate dice in finger tips.  Transitioned to picking up and stacking with focus on incorporating use of index finger.  Pt dropping dice 20% of time.  Transitioned to placing items into tall cup to facilitate increased reach and shoulder flexion. Kinesiotape: applied kinesiotape to R hand and digits in retrograde fashion to facilitate increased passive extension while still allowing pt to engage in grasp and release exercises.  PATIENT EDUCATION: Education details: ongoing condition specific education. Person educated: Patient and Spouse Education method: Explanation, Demonstration, and Verbal cues Education comprehension: verbalized understanding and needs further education  HOME EXERCISE PROGRAM: Access Code: [email or web address] URL: [email or web address] Date: 04/23/2023 Prepared by: [facility] - Outpatient  Rehab - [facility]    GOALS: Goals reviewed with patient? Yes  SHORT TERM GOALS: Target date: 07/06/23  Pt and spouse will be independent with advanced HEP for ROM, strengthening, and coordination.  Baseline: Goal status: IN  PROGRESS  2.  Pt will verbalize understanding  of task modifications and/or potential AE needs to increase ease, safety, and independence w/ ADLs.  Baseline: Goal status: IN PROGRESS  LONG TERM GOALS: Target date: 07/27/23  Pt will demonstrate improved functional grasp in RUE by 5# to open new jar, increased handwriting, and self-feeding.  Baseline: R: 9# on 05/31/23 Goal status: IN PROGRESS  2.  Pt will demonstrate improved UE functional use for ADLs as evidenced by increasing box/ blocks score by 4 blocks with RUE. Baseline: R: 18 blocks on 06/14/23 Goal status: IN PROGRESS  3.  Pt will demonstrate improved shoulder flexion as needed for IADLs (to place a dish in the cabinet and/or laundry on a shelf at eye level) Baseline:  Goal status: IN PROGRESS  4.  Pt will demonstrate improvement of finger extension by by 10* TAM of index and 15* TAM of long ringer finger to allow for more functional hand opening.  Baseline: -57 IF on 06/14/23 , -55 LF Goal status: IN PROGRESS  5.  Pt will demonstrate improved handwriting with ability to write 10 item simple grocery list with 90% legibility. Baseline:  Goal status: IN PROGRESS  6.  Pt will demonstrate improvements in functional use of dominant RUE as evidenced by improvements on Myrtis Ser and Allie Bossier Dickie La assessments. Baseline: TBD Goal status: IN PROGRESS  ASSESSMENT:  CLINICAL IMPRESSION: Pt still with questions and concerns regarding Vivistim process, however agreeable to completing fugyl-meyer at next session to further assess appropriateness.  Pt benefiting from downgrade from resistance of flexbar to utliizing towel for wrist flexion/extension and ulnar/radial deviation.  Pt continues to benefit from verbal and tactile cues to decrease compensatory shoulder movements during attempts at wrist and hand movements.  PERFORMANCE DEFICITS: in functional skills including ADLs, IADLs, coordination, dexterity, tone, ROM, strength, pain,  flexibility, Fine motor control, Gross motor control, mobility, balance, body mechanics, endurance, decreased knowledge of precautions, decreased knowledge of use of DME, and UE functional use and psychosocial skills including environmental adaptation and routines and behaviors.   IMPAIRMENTS: are limiting patient from ADLs and IADLs.   CO-MORBIDITIES: may have co-morbidities  that affects occupational performance. Patient will benefit from skilled OT to address above impairments and improve overall function.  MODIFICATION OR ASSISTANCE TO COMPLETE EVALUATION: Min-Moderate modification of tasks or assist with assess necessary to complete an evaluation.  OT OCCUPATIONAL PROFILE AND HISTORY: Detailed assessment: Review of records and additional review of physical, cognitive, psychosocial history related to current functional performance.  CLINICAL DECISION MAKING: Moderate - several treatment options, min-mod task modification necessary  REHAB POTENTIAL: Good  EVALUATION COMPLEXITY: Moderate    PLAN:  OT FREQUENCY: 2x/week  OT DURATION: 6 weeks  PLANNED INTERVENTIONS: self care/ADL training, therapeutic exercise, therapeutic activity, neuromuscular re-education, manual therapy, passive range of motion, functional mobility training, splinting, electrical stimulation, ultrasound, compression bandaging, moist heat, cryotherapy, patient/family education, psychosocial skills training, energy conservation, coping strategies training, and DME and/or AE instructions  RECOMMENDED OTHER SERVICES: NA  CONSULTED AND AGREED WITH PLAN OF CARE: Patient and family member/caregiver  PLAN FOR NEXT SESSION: Engage in shoulder flexion in supine, sitting,and standing to facilitate increased strength and endurance with ROM, continue with gross grasp tasks, further address wrist flexion/extension and ulnar/radial deviation, supination, and finger flexion and extension.  Complete Fugyl-meyer at next  session.    Rosalio Loud, OTR/L 07/05/2023, 2:04 PM  [facility] at [facility] [address], [address] [place], [place] [address] Phone # [phone] Fax # [phone]

## 2023-07-09 ENCOUNTER — Ambulatory Visit: Payer: Medicare PPO | Admitting: Occupational Therapy

## 2023-07-09 DIAGNOSIS — M25611 Stiffness of right shoulder, not elsewhere classified: Secondary | ICD-10-CM | POA: Diagnosis not present

## 2023-07-09 DIAGNOSIS — I69351 Hemiplegia and hemiparesis following cerebral infarction affecting right dominant side: Secondary | ICD-10-CM | POA: Diagnosis not present

## 2023-07-09 DIAGNOSIS — R278 Other lack of coordination: Secondary | ICD-10-CM | POA: Diagnosis not present

## 2023-07-09 DIAGNOSIS — M6281 Muscle weakness (generalized): Secondary | ICD-10-CM | POA: Diagnosis not present

## 2023-07-09 DIAGNOSIS — M79601 Pain in right arm: Secondary | ICD-10-CM | POA: Diagnosis not present

## 2023-07-09 NOTE — Therapy (Signed)
OUTPATIENT OCCUPATIONAL THERAPY NEURO  Treatment Note  Patient Name: Angela Buchanan MRN: [id number] DOB:September 25, 1951, 72 y.o., female Today's Date: 07/09/2023  PCP: Shon Hale, MD REFERRING PROVIDER: Shon Hale, MD     END OF SESSION:  OT End of Session - 07/09/23 1323     Visit Number 18    Number of Visits 26    Date for OT Re-Evaluation 07/27/23    Authorization Type [employer]    OT Start Time 1320    OT Stop Time 1400    OT Time Calculation (min) 40 min    Behavior During Therapy [facility] for tasks assessed/performed                              Past Medical History:  Diagnosis Date   Atypical chest pain 07/19/2021   Bigeminy    no current med.   Breast cancer (HCC) 06/2017   right   Bruises easily    CAD in native artery 10/31/2022   Carotid bruit 11/13/2019   Dental crowns present    History of diverticulitis    Hypertension    states under control with med., has been on med. x 5 yr.   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   Past Surgical History:  Procedure Laterality Date   ABDOMINAL HYSTERECTOMY     partial   BREAST LUMPECTOMY Right 06/22/2017   Malignant   BREAST LUMPECTOMY WITH RADIOACTIVE SEED AND SENTINEL LYMPH NODE BIOPSY Right 06/22/2017   Procedure: RIGHT BREAST LUMPECTOMY WITH RADIOACTIVE SEED AND RIGHT AXILLARY DEEP SENTINEL LYMPH NODE BIOPSY WITH BLUE DYE INJECTION;  Surgeon: Claud Kelp, MD;  Location: [facility];  Service: General;  Laterality: Right;   BREAST LUMPECTOMY WITH RADIOACTIVE SEED LOCALIZATION Left 06/22/2017   Procedure: LEFT BREAST LUMPECTOMY WITH RADIOACTIVE SEED LOCALIZATION;  Surgeon: Claud Kelp, MD;  Location:  [facility];  Service: General;  Laterality: Left;   CATARACT EXTRACTION W/ INTRAOCULAR LENS  IMPLANT, BILATERAL Bilateral    EXCISION OF BREAST BIOPSY Left 06/22/2017   benign   LOOP RECORDER INSERTION N/A 05/16/2022   Procedure: LOOP RECORDER INSERTION;  Surgeon: Lanier Prude, MD;  Location: [facility];  Service: Cardiovascular;  Laterality: N/A;   PERCUTANEOUS PINNING Right 06/22/2022   Procedure: PERCUTANEOUS PINNING OF RIGHT HIP;  Surgeon: Bjorn Pippin, MD;  Location: [facility];  Service: Orthopedics;  Laterality: Right;   TONSILLECTOMY     age 76   Patient Active Problem List   Diagnosis Date Noted   Right spastic hemiplegia (HCC) 12/14/2022   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 05/11/2020   PVC (premature ventricular contraction) 05/11/2020  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   Carotid bruit 11/13/2019   Pure hypercholesterolemia 11/13/2019   Malignant neoplasm of upper-outer quadrant of right breast in female, estrogen receptor positive (HCC) 05/22/2017   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   Diplopia 09/17/2014   Dizziness and giddiness 09/17/2014   Essential hypertension 07/22/2014   Chest pain 07/22/2014   Family history of heart disease 07/22/2014    ONSET DATE: 05/13/22 (new referral 04/09/23)  REFERRING DIAG: G83.23 (ICD-10-CM) - 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 Pt accompanied by: self and significant other ([name])  PERTINENT HISTORY: hypertension, breast cancer, hyperlipidemia, Lt hip fracture secondary to fall 06/2022, CVA 05/2022    PRECAUTIONS: Fall, Rt breast cancer with node removal, osteoporosis   WEIGHT BEARING RESTRICTIONS: No  PAIN:  Are you having pain? No  FALLS: Has patient fallen in last 6 months? No  LIVING ENVIRONMENT: Lives with: lives with their spouse Lives in: House/apartment Stairs: Yes: External: 2 steps; on left going up Has following equipment at home: Quad cane large base, Environmental consultant - 2 wheeled, Hemi walker, Wheelchair (manual), shower chair, and Grab bars  PLOF: Independent and Independent with basic ADLs  PATIENT GOALS: to have better use my arm and hand  OBJECTIVE:   HAND DOMINANCE: Right  ADLs:  Transfers/ambulation related to ADLs: Utilizes [facility] for mobility in the community, 80% in the house but will walk without occasionally Eating: husband cuts foods, utilizing built up handles for self-feeding Grooming: needs assistance with washing hair UB Dressing: needs assistance with fastening bra LB Dressing: Mod-max assist for donning pants due to fear of falling, able to don socks and slip on shoes - unable to tie shoes Toileting: Mod I Bathing: Supervision Tub Shower transfers: Supervision, but able to complete transfers with use of grab bars Equipment: Shower seat with back, Grab bars, and Walk in shower  IADLs: Handwriting:  TBA at next session  MOBILITY STATUS: Needs Assist: utilized  [facility] for mobility and Hx of falls  POSTURE COMMENTS:  rounded shoulders and forward head  ACTIVITY TOLERANCE: Activity tolerance: fatigues with increased mobility and effort  FUNCTIONAL OUTCOME MEASURES: Upper Extremity Functional Scale (UEFS): TBD  UPPER EXTREMITY ROM:    Active ROM Right eval Right 05/31/23 Right 06/14/23  Shoulder flexion 87 93 98  Shoulder abduction 78 82 86  Shoulder adduction     Shoulder extension     Shoulder internal rotation 90% 100% 100%  Shoulder external rotation 50% 75% 95% (requires increased time)  Elbow flexion 132 137 138  Elbow extension -17 -12 -12  Wrist flexion 19 40 48  Wrist extension 35 35 40  Wrist ulnar deviation 15 10 15   Wrist radial deviation 13 15 15   Wrist pronation WNL  WNL  Wrist supination 20 from neutral 65* 65  (Blank rows = not tested)  UPPER EXTREMITY MMT:     MMT Right eval Left eval  Shoulder flexion    Shoulder abduction    Shoulder adduction    Shoulder extension    Shoulder internal rotation    Shoulder external rotation    Middle trapezius  Lower trapezius    Elbow flexion    Elbow extension    Wrist flexion    Wrist extension    Wrist ulnar deviation    Wrist radial deviation    Wrist pronation    Wrist supination    (Blank rows = not tested)    Active ROM Extension (as flexion to loose gross grasp) Right eval Right 05/31/23 Right 06/14/23  Thumb MCP (0-60)     Thumb IP (0-80)     Thumb Radial abd/add (0-55)     Thumb Palmar abd/add (0-45)     Thumb opposition to index     Index MCP (0-90) 0  0  Index PIP (0-100) -30 -50 -22  Index DIP (0-70) -40 -35 -35  Long MCP (0-90) -5 0 0  Long PIP (0-100) -45 -35 -40  Long DIP (0-70) -5 -25 -15  Ring MCP (0-90_ 0 0   Ring PIP (0-100) -40 -40   Ring DIP (0-70) -20 -20   Little MCP (0-90) -7 0   Little PIP (0-100) -25 -25   Little DIP (0-70) -5 0   (Blank rows = not tested)    HAND FUNCTION: 05/31/23 Grip strength: Right: 9#  Grip  strength: Right: 7,9 = 8# lbs; Left: 30 lbs  COORDINATION: 05/31/23 9 Hole Peg test: Right: able to pick up one peg with thumb and long finger, however unable to rotate to place into peg board Box and Blocks:  Right 15 blocks  Finger Nose Finger test: slow and deliberate with RUE, but able to touch both nose and finger at this time 9 Hole Peg test: Right: unable to pick up any pegs with RUE  Box and Blocks:  Right 16 blocks, Left 55 blocks  SENSATION: Light touch: WFL Hot/Cold: WFL  EDEMA: no swelling on eval  MUSCLE TONE: RUE: Mild and Hypertonic  COGNITION: Overall cognitive status: Within functional limits for tasks assessed  VISION: Subjective report: pt reports wearing glasses more since CVA Baseline vision: Wears glasses all the time, double vision after cataract removal Visual history: cataracts  OBSERVATIONS: Pt attempting grasp and pinch activities, frequently not incorporating index finger on R with increased flexion away from task.   TODAY'S TREATMENT:                                                           07/09/23 Supine UE ROM: engaged in shoulder flexion, abduction/adduction, internal/external rotation, and elbow flexion/extension while in abduction in supine for increased scapular support and gravity-minimized/eliminated positioning.  OT providing min cues for technique and tactile cues at R elbow and wrist during internal/external rotation to facilitate increased ROM. Engaged in chest press and shoulder flexion with dowel to further facilitate increased ROM. OT providing intermittent tapping at R elbow to facilitate increased elbow extension during chest press.   Sitting UE ROM: engaged in functional reach across midline with RUE towards knee to facilitate increased elbow extension, then abduction and forearm rotation to reach bring "phone" to ear.   Wrist ROM: OT reviewed typical ROM for ulnar and radial deviation and encouraged PROM to resistance with ulnar  deviation and to sustain hold ~5 seconds for increased ROM.      07/05/23 Vivistim: engaged in discussion about vivistim with pt reporting that she has appt with PM&R  regarding possibility.  Pt still with questions and concerns.  Plan to complete fugyl-meyer assessment at next visit. Flex bar: utilized red flexbar with focus on wrist flexion/extension and ulnar/radial deviation. OT hand over hand to facilitate proper positioning and increased ROM with ulnar/radial deviation.  Pt demonstrating decreased strength and sustained grasp especially with radial deviation.   OT down graded task to completing with towel.  Engaged in rotation to facilitate wrist flexion/extension and ulnar/radial deviation to facilitate ROM without resistance.  Pt with increased tolerance and ROM with removal of resistance.   Golda Acre: IADL Scale  A. Ability to Use Telephone  Operates telephone on own initiative-looks up and dials numbers, etc. - 1  B. Shopping Needs to be accompanied on any shopping trip - 0  C. Food Preparation Needs to have meals prepared and served - 0   D. Housekeeping Performs light daily tasks such as dish washing, bed making - 1  E. Laundry  Does Engineer, manufacturing - 1  F. Mode of Transportation  Travel limited to taxi or automobile with assistance of another - 0  G. Responsibility for Own Medications Is responsible for taking medication in correct dosages at correct time - 1  H. Ability to [employer] of handling money - 0   TOTAL LAWTON BRODY SCORE: 4/8  -----------------------------------------------------------  Myrtis Ser Index of Independence in ADL INDEPENDENCE (1) DEPENDENCE (0)  NO supervision, direction or personal assistance WITH supervision, direction, personal assistance or total care   ACTIVITIES 1 or 0  Bathing 1  Dressing 0  Toileting 1  Transferring 1  Continence 1  Feeding 1    KATZ TOTAL: 5/6      07/02/23 Functional grasp: engaged in  tasks incorporating pinch with picking up and stacking blocks, shoulder ROM with placing items into vertical container, and increased shoulder abduction and extension with picking up items from various planes to facilitate increased ROM and then placing items into container.  Pt continues to utilize gross grasp with decreased index finger engagement. PROM: OT providing facilitation for increased forearm supination, providing tactile and verbal cues for increased ROM progressing to AAROM.  Pt demonstrating min supination, utilizing L hand to further facilitate increased ROM. Engaged in PROM with ulnar/radial deviation with pt able to facilitate movement with only cues and use of LUE for increased ROM. Coordination: engaged in flipping cards with focus on grasp, wrist mobility, and supination.  Pt continues to utilize compensatory movements due to decreased ulnar deviation and supination. Rotation: attempted rotating ball on table top to simulate turning water faucet on/off.  Pt continues to demonstrate difficulty with wrist ulnar/radial deviation.    PATIENT EDUCATION: Education details: ongoing condition specific education. Person educated: Patient and Spouse Education method: Explanation, Demonstration, and Verbal cues Education comprehension: verbalized understanding and needs further education  HOME EXERCISE PROGRAM: Access Code: [email or web address] URL: [email or web address] Date: 04/23/2023 Prepared by: [facility] - Out[facility]  Re[facility] - [facility]    GOALS: Goals reviewed with patient? Yes  SHORT TERM GOALS: Target date: 07/06/23  Pt and spouse will be independent with advanced HEP for ROM, strengthening, and coordination.  Baseline: Goal status: IN PROGRESS  2.  Pt will verbalize understanding of task modifications and/or potential AE needs to increase ease, safety, and independence w/ ADLs.  Baseline: Goal status: IN PROGRESS  LONG TERM GOALS: Target date: 07/27/23  Pt  will demonstrate improved functional grasp in RUE by 5# to open new jar, increased handwriting, and self-feeding.  Baseline: R: 9#  on 05/31/23 Goal status: IN PROGRESS  2.  Pt will demonstrate improved UE functional use for ADLs as evidenced by increasing box/ blocks score by 4 blocks with RUE. Baseline: R: 18 blocks on 06/14/23 Goal status: IN PROGRESS  3.  Pt will demonstrate improved shoulder flexion as needed for IADLs (to place a dish in the cabinet and/or laundry on a shelf at eye level) Baseline:  Goal status: IN PROGRESS  4.  Pt will demonstrate improvement of finger extension by by 10* TAM of index and 15* TAM of long ringer finger to allow for more functional hand opening.  Baseline: -57 IF on 06/14/23 , -55 LF Goal status: IN PROGRESS  5.  Pt will demonstrate improved handwriting with ability to write 10 item simple grocery list with 90% legibility. Baseline:  Goal status: IN PROGRESS  6.  Pt will demonstrate improvements in functional use of dominant RUE as evidenced by improvements on Myrtis Ser and Allie Bossier Dickie La assessments. Baseline: TBD Goal status: IN PROGRESS  ASSESSMENT:  CLINICAL IMPRESSION: Pt still with questions and concerns regarding Vivistim process, declining fugyl-meyer this session.  Pt demonstrating improvements in shoulder ROM and elbow ROM in supine with improved motor control and flexibility.  Pt tolerating PROM with internal/external shoulder rotation and understanding carryover to functional tasks.  Pt continues to demonstrate tightness in shoulder flexion, external rotation, and forearm and wrist mobility.   PERFORMANCE DEFICITS: in functional skills including ADLs, IADLs, coordination, dexterity, tone, ROM, strength, pain, flexibility, Fine motor control, Gross motor control, mobility, balance, body mechanics, endurance, decreased knowledge of precautions, decreased knowledge of use of DME, and UE functional use and psychosocial skills including environmental  adaptation and routines and behaviors.   IMPAIRMENTS: are limiting patient from ADLs and IADLs.   CO-MORBIDITIES: may have co-morbidities  that affects occupational performance. Patient will benefit from skilled OT to address above impairments and improve overall function.  MODIFICATION OR ASSISTANCE TO COMPLETE EVALUATION: Min-Moderate modification of tasks or assist with assess necessary to complete an evaluation.  OT OCCUPATIONAL PROFILE AND HISTORY: Detailed assessment: Review of records and additional review of physical, cognitive, psychosocial history related to current functional performance.  CLINICAL DECISION MAKING: Moderate - several treatment options, min-mod task modification necessary  REHAB POTENTIAL: Good  EVALUATION COMPLEXITY: Moderate    PLAN:  OT FREQUENCY: 2x/week  OT DURATION: 6 weeks  PLANNED INTERVENTIONS: self care/ADL training, therapeutic exercise, therapeutic activity, neuromuscular re-education, manual therapy, passive range of motion, functional mobility training, splinting, electrical stimulation, ultrasound, compression bandaging, moist heat, cryotherapy, patient/family education, psychosocial skills training, energy conservation, coping strategies training, and DME and/or AE instructions  RECOMMENDED OTHER SERVICES: NA  CONSULTED AND AGREED WITH PLAN OF CARE: Patient and family member/caregiver  PLAN FOR NEXT SESSION: Engage in shoulder flexion in sitting,and standing to facilitate increased strength and endurance with ROM, continue with gross grasp tasks, further address wrist flexion/extension and ulnar/radial deviation, supination, and finger flexion and extension.     Rosalio Loud, OTR/L 07/09/2023, 1:23 PM  [facility] at [facility] [address] [place], [address] [place], [place] [address] Phone # [phone] Fax # [phone]

## 2023-07-10 ENCOUNTER — Telehealth: Payer: Self-pay | Admitting: Physical Medicine & Rehabilitation

## 2023-07-10 NOTE — Telephone Encounter (Signed)
Patient called in , she is scheduled for a vivistim evaluation with Dr Wynn Banker, and she has questions on what to expect .

## 2023-07-11 ENCOUNTER — Ambulatory Visit: Payer: Medicare PPO

## 2023-07-11 DIAGNOSIS — R293 Abnormal posture: Secondary | ICD-10-CM

## 2023-07-11 DIAGNOSIS — R2689 Other abnormalities of gait and mobility: Secondary | ICD-10-CM | POA: Diagnosis not present

## 2023-07-11 DIAGNOSIS — I69351 Hemiplegia and hemiparesis following cerebral infarction affecting right dominant side: Secondary | ICD-10-CM | POA: Diagnosis not present

## 2023-07-11 DIAGNOSIS — R2681 Unsteadiness on feet: Secondary | ICD-10-CM | POA: Diagnosis not present

## 2023-07-11 DIAGNOSIS — M6281 Muscle weakness (generalized): Secondary | ICD-10-CM

## 2023-07-11 NOTE — Telephone Encounter (Signed)
Left VM for patient to advise below per Dr Wynn Banker what to expect for next appt

## 2023-07-11 NOTE — Therapy (Signed)
OUTPATIENT PHYSICAL THERAPY TREATMENT NOTE   Patient Name: Angela Buchanan MRN: [id number] DOB:[birth year], 72 y.o., female Today's Date: 07/11/2023  PCP:  Garth Bigness, MD   REFERRING PROVIDER:  Garth Bigness, MD       END OF SESSION:   PT End of Session - 07/11/23 1626     Visit Number 44    Date for PT Re-Evaluation 07/20/23    Authorization Type [employer]    Authorization Time Period 8 visits 8/22-10/18/24    Authorization - Visit Number 6    Authorization - Number of Visits 8    Progress Note Due on Visit 50    PT Start Time 1532    PT Stop Time 1618    PT Time Calculation (min) 46 min    Activity Tolerance Patient tolerated treatment well    Behavior During Therapy [facility] for tasks assessed/performed                        Past Medical History:  Diagnosis Date   Atypical chest pain 07/19/2021   Bigeminy    no current med.   Breast cancer (HCC) 06/2017   right   Bruises easily    CAD in native artery 10/31/2022   Carotid bruit 11/13/2019   Dental crowns present    History of diverticulitis    Hypertension    states under control with med., has been on med. x 5 yr.   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   Past Surgical History:  Procedure Laterality Date   ABDOMINAL HYSTERECTOMY     partial   BREAST LUMPECTOMY Right 06/22/2017   Malignant   BREAST LUMPECTOMY WITH RADIOACTIVE SEED AND SENTINEL LYMPH NODE BIOPSY Right 06/22/2017   Procedure: RIGHT BREAST LUMPECTOMY WITH RADIOACTIVE SEED AND RIGHT AXILLARY DEEP SENTINEL LYMPH NODE BIOPSY WITH BLUE DYE INJECTION;  Surgeon: Claud Kelp, MD;  Location: [facility];  Service: General;  Laterality: Right;   BREAST  LUMPECTOMY WITH RADIOACTIVE SEED LOCALIZATION Left 06/22/2017   Procedure: LEFT BREAST LUMPECTOMY WITH RADIOACTIVE SEED LOCALIZATION;  Surgeon: Claud Kelp, MD;  Location: [facility];  Service: General;  Laterality: Left;   CATARACT EXTRACTION W/ INTRAOCULAR LENS  IMPLANT, BILATERAL Bilateral    EXCISION OF BREAST BIOPSY Left 06/22/2017   benign   LOOP RECORDER INSERTION N/A 05/16/2022   Procedure: LOOP RECORDER INSERTION;  Surgeon: Lanier Prude, MD;  Location: [facility];  Service: Cardiovascular;  Laterality: N/A;   PERCUTANEOUS PINNING Right 06/22/2022   Procedure: PERCUTANEOUS PINNING OF RIGHT HIP;  Surgeon: Bjorn Pippin, MD;  Location: [facility];  Service: Orthopedics;  Laterality: Right;   TONSILLECTOMY     age 89   Patient Active Problem List   Diagnosis Date Noted   Right spastic hemiplegia (HCC) 12/14/2022   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   CAD in native artery 10/31/2022   Malnutrition of moderate degree 06/22/2022   Closed fracture of femur, neck (HCC) 06/21/2022   Urinary frequency 06/21/2022   Hypokalemia 06/21/2022   Heart murmur 06/21/2022   Hypothyroidism 06/21/2022   Dysphagia 05/17/2022   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   CVA (cerebral vascular accident) (HCC) 05/12/2022   Osteoporosis 01/24/2022  Atypical chest pain 07/19/2021   Temporomandibular joint (TMJ) pain 07/16/2020   Referred otalgia of both ears 07/16/2020   PAC (premature atrial contraction) 05/11/2020   PVC (premature ventricular contraction) 05/11/2020   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   Carotid bruit 11/13/2019   Pure hypercholesterolemia 11/13/2019   Malignant neoplasm of upper-outer quadrant of right breast in female, estrogen receptor positive (HCC) 05/22/2017   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   Diplopia 09/17/2014   Dizziness and giddiness 09/17/2014   Essential hypertension 07/22/2014   Chest pain 07/22/2014   Family history  of heart disease 07/22/2014    REFERRING DIAG:  R29.6 (ICD-10-CM) - Repeated falls, closed fracture of Rt hip    THERAPY DIAG:  Muscle weakness (generalized)  Other abnormalities of gait and mobility  Abnormal posture  Rationale for Evaluation and Treatment Rehabilitation  PERTINENT HISTORY: hypertension, breast cancer, hyperlipidemia, Lt hip fracture secondary to fall 06/2022, CVA 06/2022   PRECAUTIONS:  Fall, Rt breast cancer with node removal, osteoporosis    SUBJECTIVE:                                                                                                                                                                                      SUBJECTIVE STATEMENT:  I'm doing good.  I've been walking. I walked all over [place] and I didn't use a scooter.  I've been walking with my dog and this takes me 45 min.   Pt spouse present during visit.   PAIN:  PAIN:  Are you having pain? No     OBJECTIVE: (objective measures completed at initial evaluation unless otherwise dated)  DIAGNOSTIC FINDINGS: Rt hip fracture and ORIF    COGNITION: Overall cognitive status: Within functional limits for tasks assessed                         SENSATION: WFL  POSTURE: rounded shoulders, forward head, flexed trunk , and weight shift left   PALPATION: NA   LOWER EXTREMITY ROM: Rt hip limited by 50%, hamstrings limited by 50% bil.    LOWER EXTREMITY MMT:   MMT Right eval Right  09/27/22 Left eval Right 10/30/22 Right  01/17/23 Right 05/23/23 Left 10/30/22 Left 01/17/23  Hip flexion 3+ 4- 4- 4- 4 4 4- 4+  Hip extension            Hip abduction 3-  4     4  Hip adduction            Hip internal rotation            Hip external rotation  Knee flexion 3- 4- 3+  4 4  4+  Knee extension 3+ 4 4- 4 painful hip 4+ 4+ 4- 4+  Ankle dorsiflexion 3+ 4- 4- 4-   4   Ankle plantarflexion            Ankle inversion            Ankle eversion             (Blank rows  = not tested)   FUNCTIONAL TESTS:   Eval: Timed up and go (TUG): 1 min, 27 seconds   09/27/22: 5x sit to stand: 29 seconds with use of Lt hand  TUG: 27 seconds   10/30/22: 5x sit to stand: 23 sec use of LT hand     TUG: 26 sec with standard cane   11/20/22: 5x sit to stand: 18.68 seconds    TUG: 19.45 without device    3 minute walk test: 225 feet with a standard cane   01/10/23: 5x sit to stand: 15.77 seconds   01/17/23: TUG with cane: 15.41 seconds with cane    3 min walk test: 313 feet with cane    03/07/23: 3 min walk 275 feet with single point cane   TUG: 16 seconds   03/14/23: 6 min walk test: 723 feet with standard cane    TUG: 14.16 without cane    5x sit to stand: 12.15 seconds without hand   03/28/23 TUG 11 seconds without cane  5x STS 10.6 seconds no Ues  04/18/2023: 6 min walk test:  532 ft with [facility]  05/09/23: 6 min walk test with SPC: 667 feet - RPE 5  05/23/23: TUG: 11 seconds without cane 5x sit to stand: 12.71 seconds  6 min walk test: 763 feet  06/06/23:  5x sit to stand: 11.98 seconds    07/11/23:  5x sit to stand: 11.47 seconds   Short Physical Performance Battery:    Balance:       Side by side stance:    points (1)-1  Semi -tandem:     points (1)-1  Tandem:      points (2)-2    Gait Speed: (47m=9.84 feet) done 2x take the best time:      points                                           6.31 seconds-2 pts     Repeated Chair Stands:      points   (timer stopped when straight on 5th stand)                                             9.69 seconds- 4 pts  Total score=  10/12  points <10/12 predictive of 1 or more mobility limitations and increased risk of mobility disability 6 or less/12 associated with a higher fall rate  TODAY'S TREATMENT:   DATE:  07/11/23 Nustep level 3 x10 minutes with LE only. >55 spm PT present throughout to discuss status Farmers carry 6# Lt UE  from gym mat table to mirror 2 laps Alternating step tap on 6" step 2x10 with CGA Leg Press (seat at 5) 55# 2x10, single leg: Lt 35#, Rt 15# 2x10 bil each Step to over hurdles with hand held assist on the Lt: Lt lead and Rt lead with verbal cues for step height  DATE:  07/04/23 Nustep level 3 x10 minutes with LE only. >55 spm PT present throughout to discuss status Farmers carry 6# Lt UE from gym mat table to mirror 2 laps Alternating step tap on 6" step 2x10 with CGA Leg Press (seat at 5) 55# 2x10, single leg: Lt 35#, Rt 15# 2x10 bil each Sit to stand: 6# 2x10 Step to over hurdles with hand held assist on the Lt: Lt lead and Rt lead with verbal cues for step height Ambulation around clinic for 4 minutes using a straight cane.   DATE:  06/27/2023 Nustep level 3 x10 minutes with LE only. >55 spm PT present throughout to discuss status Farmers carry 6# Lt UE from gym mat table to mirror 2 laps Alternating step tap on 6" step 2x10 with CGA Leg Press (seat at 5) 55# 2x10, single leg: Lt 35#, Rt 15# 2x10 bil each Sit to stand: 6# 2x10 Step to over hurdles with hand held assist on the Lt: Lt lead and Rt lead with verbal cues for step height Ambulation around clinic for 4 minutes using a straight cane.   PATIENT EDUCATION:  Education details: Access Code: [email or web address] Person educated: Patient Education method: Explanation, Facilities manager, and Handouts Education comprehension: verbalized understanding and returned demonstration   HOME EXERCISE PROGRAM: Access Code: [email or web address] URL: [email or web address] Date: 09/27/2022 Prepared by: Tresa Endo  Exercises - Seated Long Arc Quad  - 3 x daily - 7 x weekly - 2 sets - 10 reps - 5 hold - Seated March   - 3 x daily - 7 x weekly - 3 sets - 10 reps - Seated Heel Toe Raises   - 3 x daily - 7 x weekly - 2 sets - 10 reps - Seated Isometric Hip Adduction with Ball  - 3 x daily - 7 x weekly - 2 sets - 10 reps - Sit to Stand with Armchair  - 2 x  daily - 7 x weekly - 2 sets - 5-10 reps - Standing Hip Abduction with Counter Support  - 1 x daily - 7 x weekly - 2 sets - 10 reps - Heel Raises with Counter Support  - 2 x daily - 7 x weekly - 2 sets - 10 reps  Patient Education - Walking with a Single [employer] - Modified 4 Point Gait Pattern  ASSESSMENT:   CLINICAL IMPRESSION:     Today's treatment session focused on general strengthening and balance. Pt scored 10/12 on short physical performance battery test which is at the edge of increased risk of mobility disability.  She continues to be more independent with exercises in the clinic and requires closer supervision with higher level tasks.  Patient tolerated treatment session well.  Patient will benefit from skilled PT to address the below impairments and improve overall function.    OBJECTIVE IMPAIRMENTS: Abnormal gait, decreased activity tolerance, decreased balance, decreased mobility, difficulty walking, decreased strength, decreased safety awareness, impaired perceived functional ability, impaired flexibility, postural dysfunction, and pain.    ACTIVITY  LIMITATIONS: carrying, lifting, sitting, standing, stairs, transfers, hygiene/grooming, and locomotion level   PARTICIPATION LIMITATIONS: meal prep, cleaning, laundry, driving, shopping, and community activity   PERSONAL FACTORS: Age, Past/current experiences, and 1-2 comorbidities: CVA, falls, Rt hip fracture with ORIF  are also affecting patient's functional outcome.    REHAB POTENTIAL: Good   CLINICAL DECISION MAKING: Evolving/moderate complexity   EVALUATION COMPLEXITY: Moderate     GOALS: Goals reviewed with patient? Yes   SHORT TERM GOALS: Target date: 09/06/2022   Be independent in initial HEP Baseline: Goal status: Goal met 08/14/22   2.  Improve LE strength to perform sit to stand with moderate Lt UE support Baseline:  Goal status: Goal met 08/28/22  3.  Perform TUG in < or = to 60 seconds to reduce falls  risk Baseline: 27 seconds (09/27/22) Goal status: MET      LONG TERM GOALS: Target date:  07/20/23   Be independent in advanced HEP Baseline: independent in current HEP and has tolerated progress when appropriate (05/23/23)  Goal status: in progress    2.  Perform TUG in < or = to 13 seconds to reduce falls risk Baseline: 11 seconds without cane (05/23/23) Goal status: met   3.  ambulate > or = to 875 feet in 6 minutes to improve community ambulation and independence  Baseline: 763 (05/23/23) Goal status: In progress      4.  perform 5x sit to stand in < or = to 12 seconds to reduce falls risk  Baseline: 11.47 seconds (07/11/23)  Goal Status: MET  8. Improve balance to walk her dog short distances with independence   Baseline: walking driveway with dog (05/23/23)  Goal status: in progress     PLAN:   PT FREQUENCY: 1x/week   PT DURATION: 10 weeks   PLANNED INTERVENTIONS: Therapeutic exercises, Therapeutic activity, Neuromuscular re-education, Balance training, Gait training, Patient/Family education, Self Care, Joint mobilization, Stair training, Dry Needling, Electrical stimulation, Cryotherapy, Moist heat, Taping, Manual therapy, and Re-evaluation   PLAN FOR NEXT SESSION: Continue to work on gait , strength and balance. Endurance, change of direction.  ERO next visit    Lorrene Reid, PT 07/11/23 4:27 PM   [facility] [address] [place], [place] [address] Phone # [phone] Fax [phone]

## 2023-07-12 DIAGNOSIS — H532 Diplopia: Secondary | ICD-10-CM | POA: Diagnosis not present

## 2023-07-12 DIAGNOSIS — H5203 Hypermetropia, bilateral: Secondary | ICD-10-CM | POA: Diagnosis not present

## 2023-07-12 DIAGNOSIS — H524 Presbyopia: Secondary | ICD-10-CM | POA: Diagnosis not present

## 2023-07-12 DIAGNOSIS — Z961 Presence of intraocular lens: Secondary | ICD-10-CM | POA: Diagnosis not present

## 2023-07-12 DIAGNOSIS — H52223 Regular astigmatism, bilateral: Secondary | ICD-10-CM | POA: Diagnosis not present

## 2023-07-15 DIAGNOSIS — N3000 Acute cystitis without hematuria: Secondary | ICD-10-CM | POA: Diagnosis not present

## 2023-07-15 DIAGNOSIS — R300 Dysuria: Secondary | ICD-10-CM | POA: Diagnosis not present

## 2023-07-16 ENCOUNTER — Ambulatory Visit: Payer: Medicare PPO | Admitting: Occupational Therapy

## 2023-07-16 DIAGNOSIS — M79601 Pain in right arm: Secondary | ICD-10-CM | POA: Diagnosis not present

## 2023-07-16 DIAGNOSIS — R278 Other lack of coordination: Secondary | ICD-10-CM

## 2023-07-16 DIAGNOSIS — I69351 Hemiplegia and hemiparesis following cerebral infarction affecting right dominant side: Secondary | ICD-10-CM

## 2023-07-16 DIAGNOSIS — M6281 Muscle weakness (generalized): Secondary | ICD-10-CM

## 2023-07-16 DIAGNOSIS — M25611 Stiffness of right shoulder, not elsewhere classified: Secondary | ICD-10-CM | POA: Diagnosis not present

## 2023-07-16 NOTE — Therapy (Signed)
OUTPATIENT OCCUPATIONAL THERAPY NEURO  Treatment Note  Patient Name: Angela Buchanan MRN: [id number] DOB:1950-11-22, 72 y.o., female Today's Date: 07/16/2023  PCP: Shon Hale, MD REFERRING PROVIDER: Shon Hale, MD     END OF SESSION:  OT End of Session - 07/16/23 1319     Visit Number 19    Number of Visits 26    Date for OT Re-Evaluation 07/27/23    Authorization Type [employer]    OT Start Time 1315    OT Stop Time 1351   needing to leave early for another appt   OT Time Calculation (min) 36 min    Behavior During Therapy [facility] for tasks assessed/performed                               Past Medical History:  Diagnosis Date   Atypical chest pain 07/19/2021   Bigeminy    no current med.   Breast cancer (HCC) 06/2017   right   Bruises easily    CAD in native artery 10/31/2022   Carotid bruit 11/13/2019   Dental crowns present    History of diverticulitis    Hypertension    states under control with med., has been on med. x 5 yr.   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   Past Surgical History:  Procedure Laterality Date   ABDOMINAL HYSTERECTOMY     partial   BREAST LUMPECTOMY Right 06/22/2017   Malignant   BREAST LUMPECTOMY WITH RADIOACTIVE SEED AND SENTINEL LYMPH NODE BIOPSY Right 06/22/2017   Procedure: RIGHT BREAST LUMPECTOMY WITH RADIOACTIVE SEED AND RIGHT AXILLARY DEEP SENTINEL LYMPH NODE BIOPSY WITH BLUE DYE INJECTION;  Surgeon: Claud Kelp, MD;  Location: [facility];  Service: General;  Laterality: Right;   BREAST LUMPECTOMY WITH RADIOACTIVE SEED LOCALIZATION Left 06/22/2017   Procedure: LEFT BREAST LUMPECTOMY WITH RADIOACTIVE SEED  LOCALIZATION;  Surgeon: Claud Kelp, MD;  Location: [facility];  Service: General;  Laterality: Left;   CATARACT EXTRACTION W/ INTRAOCULAR LENS  IMPLANT, BILATERAL Bilateral    EXCISION OF BREAST BIOPSY Left 06/22/2017   benign   LOOP RECORDER INSERTION N/A 05/16/2022   Procedure: LOOP RECORDER INSERTION;  Surgeon: Lanier Prude, MD;  Location: [facility];  Service: Cardiovascular;  Laterality: N/A;   PERCUTANEOUS PINNING Right 06/22/2022   Procedure: PERCUTANEOUS PINNING OF RIGHT HIP;  Surgeon: Bjorn Pippin, MD;  Location: [facility];  Service: Orthopedics;  Laterality: Right;   TONSILLECTOMY     age 44   Patient Active Problem List   Diagnosis Date Noted   Right spastic hemiplegia (HCC) 12/14/2022   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  OUTPATIENT OCCUPATIONAL THERAPY NEURO  Treatment Note  Patient Name: Angela Buchanan MRN: [id number] DOB:1950-11-22, 72 y.o., female Today's Date: 07/16/2023  PCP: Shon Hale, MD REFERRING PROVIDER: Shon Hale, MD     END OF SESSION:  OT End of Session - 07/16/23 1319     Visit Number 19    Number of Visits 26    Date for OT Re-Evaluation 07/27/23    Authorization Type [employer]    OT Start Time 1315    OT Stop Time 1351   needing to leave early for another appt   OT Time Calculation (min) 36 min    Behavior During Therapy [facility] for tasks assessed/performed                               Past Medical History:  Diagnosis Date   Atypical chest pain 07/19/2021   Bigeminy    no current med.   Breast cancer (HCC) 06/2017   right   Bruises easily    CAD in native artery 10/31/2022   Carotid bruit 11/13/2019   Dental crowns present    History of diverticulitis    Hypertension    states under control with med., has been on med. x 5 yr.   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   Past Surgical History:  Procedure Laterality Date   ABDOMINAL HYSTERECTOMY     partial   BREAST LUMPECTOMY Right 06/22/2017   Malignant   BREAST LUMPECTOMY WITH RADIOACTIVE SEED AND SENTINEL LYMPH NODE BIOPSY Right 06/22/2017   Procedure: RIGHT BREAST LUMPECTOMY WITH RADIOACTIVE SEED AND RIGHT AXILLARY DEEP SENTINEL LYMPH NODE BIOPSY WITH BLUE DYE INJECTION;  Surgeon: Claud Kelp, MD;  Location: [facility];  Service: General;  Laterality: Right;   BREAST LUMPECTOMY WITH RADIOACTIVE SEED LOCALIZATION Left 06/22/2017   Procedure: LEFT BREAST LUMPECTOMY WITH RADIOACTIVE SEED  LOCALIZATION;  Surgeon: Claud Kelp, MD;  Location: [facility];  Service: General;  Laterality: Left;   CATARACT EXTRACTION W/ INTRAOCULAR LENS  IMPLANT, BILATERAL Bilateral    EXCISION OF BREAST BIOPSY Left 06/22/2017   benign   LOOP RECORDER INSERTION N/A 05/16/2022   Procedure: LOOP RECORDER INSERTION;  Surgeon: Lanier Prude, MD;  Location: [facility];  Service: Cardiovascular;  Laterality: N/A;   PERCUTANEOUS PINNING Right 06/22/2022   Procedure: PERCUTANEOUS PINNING OF RIGHT HIP;  Surgeon: Bjorn Pippin, MD;  Location: [facility];  Service: Orthopedics;  Laterality: Right;   TONSILLECTOMY     age 44   Patient Active Problem List   Diagnosis Date Noted   Right spastic hemiplegia (HCC) 12/14/2022   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  OUTPATIENT OCCUPATIONAL THERAPY NEURO  Treatment Note  Patient Name: Angela Buchanan MRN: [id number] DOB:1950-11-22, 72 y.o., female Today's Date: 07/16/2023  PCP: Shon Hale, MD REFERRING PROVIDER: Shon Hale, MD     END OF SESSION:  OT End of Session - 07/16/23 1319     Visit Number 19    Number of Visits 26    Date for OT Re-Evaluation 07/27/23    Authorization Type [employer]    OT Start Time 1315    OT Stop Time 1351   needing to leave early for another appt   OT Time Calculation (min) 36 min    Behavior During Therapy [facility] for tasks assessed/performed                               Past Medical History:  Diagnosis Date   Atypical chest pain 07/19/2021   Bigeminy    no current med.   Breast cancer (HCC) 06/2017   right   Bruises easily    CAD in native artery 10/31/2022   Carotid bruit 11/13/2019   Dental crowns present    History of diverticulitis    Hypertension    states under control with med., has been on med. x 5 yr.   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   Past Surgical History:  Procedure Laterality Date   ABDOMINAL HYSTERECTOMY     partial   BREAST LUMPECTOMY Right 06/22/2017   Malignant   BREAST LUMPECTOMY WITH RADIOACTIVE SEED AND SENTINEL LYMPH NODE BIOPSY Right 06/22/2017   Procedure: RIGHT BREAST LUMPECTOMY WITH RADIOACTIVE SEED AND RIGHT AXILLARY DEEP SENTINEL LYMPH NODE BIOPSY WITH BLUE DYE INJECTION;  Surgeon: Claud Kelp, MD;  Location: [facility];  Service: General;  Laterality: Right;   BREAST LUMPECTOMY WITH RADIOACTIVE SEED LOCALIZATION Left 06/22/2017   Procedure: LEFT BREAST LUMPECTOMY WITH RADIOACTIVE SEED  LOCALIZATION;  Surgeon: Claud Kelp, MD;  Location: [facility];  Service: General;  Laterality: Left;   CATARACT EXTRACTION W/ INTRAOCULAR LENS  IMPLANT, BILATERAL Bilateral    EXCISION OF BREAST BIOPSY Left 06/22/2017   benign   LOOP RECORDER INSERTION N/A 05/16/2022   Procedure: LOOP RECORDER INSERTION;  Surgeon: Lanier Prude, MD;  Location: [facility];  Service: Cardiovascular;  Laterality: N/A;   PERCUTANEOUS PINNING Right 06/22/2022   Procedure: PERCUTANEOUS PINNING OF RIGHT HIP;  Surgeon: Bjorn Pippin, MD;  Location: [facility];  Service: Orthopedics;  Laterality: Right;   TONSILLECTOMY     age 44   Patient Active Problem List   Diagnosis Date Noted   Right spastic hemiplegia (HCC) 12/14/2022   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  OUTPATIENT OCCUPATIONAL THERAPY NEURO  Treatment Note  Patient Name: Angela Buchanan MRN: [id number] DOB:1950-11-22, 72 y.o., female Today's Date: 07/16/2023  PCP: Shon Hale, MD REFERRING PROVIDER: Shon Hale, MD     END OF SESSION:  OT End of Session - 07/16/23 1319     Visit Number 19    Number of Visits 26    Date for OT Re-Evaluation 07/27/23    Authorization Type [employer]    OT Start Time 1315    OT Stop Time 1351   needing to leave early for another appt   OT Time Calculation (min) 36 min    Behavior During Therapy [facility] for tasks assessed/performed                               Past Medical History:  Diagnosis Date   Atypical chest pain 07/19/2021   Bigeminy    no current med.   Breast cancer (HCC) 06/2017   right   Bruises easily    CAD in native artery 10/31/2022   Carotid bruit 11/13/2019   Dental crowns present    History of diverticulitis    Hypertension    states under control with med., has been on med. x 5 yr.   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   Past Surgical History:  Procedure Laterality Date   ABDOMINAL HYSTERECTOMY     partial   BREAST LUMPECTOMY Right 06/22/2017   Malignant   BREAST LUMPECTOMY WITH RADIOACTIVE SEED AND SENTINEL LYMPH NODE BIOPSY Right 06/22/2017   Procedure: RIGHT BREAST LUMPECTOMY WITH RADIOACTIVE SEED AND RIGHT AXILLARY DEEP SENTINEL LYMPH NODE BIOPSY WITH BLUE DYE INJECTION;  Surgeon: Claud Kelp, MD;  Location: [facility];  Service: General;  Laterality: Right;   BREAST LUMPECTOMY WITH RADIOACTIVE SEED LOCALIZATION Left 06/22/2017   Procedure: LEFT BREAST LUMPECTOMY WITH RADIOACTIVE SEED  LOCALIZATION;  Surgeon: Claud Kelp, MD;  Location: [facility];  Service: General;  Laterality: Left;   CATARACT EXTRACTION W/ INTRAOCULAR LENS  IMPLANT, BILATERAL Bilateral    EXCISION OF BREAST BIOPSY Left 06/22/2017   benign   LOOP RECORDER INSERTION N/A 05/16/2022   Procedure: LOOP RECORDER INSERTION;  Surgeon: Lanier Prude, MD;  Location: [facility];  Service: Cardiovascular;  Laterality: N/A;   PERCUTANEOUS PINNING Right 06/22/2022   Procedure: PERCUTANEOUS PINNING OF RIGHT HIP;  Surgeon: Bjorn Pippin, MD;  Location: [facility];  Service: Orthopedics;  Laterality: Right;   TONSILLECTOMY     age 44   Patient Active Problem List   Diagnosis Date Noted   Right spastic hemiplegia (HCC) 12/14/2022   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 Baseline: R: 18 blocks on 06/14/23 Goal status: IN PROGRESS  3.  Pt will demonstrate improved shoulder flexion as needed for IADLs (to place a dish in the cabinet and/or laundry on a shelf at eye level) Baseline:  Goal status: IN PROGRESS  4.  Pt will demonstrate improvement of finger extension by by 10* TAM of index and 15* TAM of long ringer finger to allow for more functional hand opening.  Baseline: -57 IF on 06/14/23 , -55 LF Goal status: IN PROGRESS  5.  Pt will demonstrate improved handwriting with ability to write 10 item simple grocery list with 90% legibility. Baseline:  Goal status: IN PROGRESS  6.  Pt will demonstrate improvements in functional use of dominant RUE as evidenced by improvements on Myrtis Ser and Allie Bossier Dickie La assessments. Baseline: TBD Goal status: IN PROGRESS  ASSESSMENT:  CLINICAL IMPRESSION: Pt demonstrating improvements in shoulder ROM and elbow ROM while engaging in structured reaching tasks.  Pt tolerating increased AAROM with internal/external shoulder rotation and understanding carryover to functional tasks.  Pt continues to demonstrate tightness in shoulder flexion, external rotation, and forearm supination.   PERFORMANCE DEFICITS: in functional skills including ADLs, IADLs, coordination, dexterity, tone, ROM, strength, pain, flexibility, Fine motor control, Gross motor control, mobility, balance, body mechanics, endurance, decreased knowledge of precautions, decreased knowledge of use of DME, and UE functional use and psychosocial skills including environmental adaptation and routines and behaviors.   IMPAIRMENTS: are limiting patient from ADLs and IADLs.   CO-MORBIDITIES: may have co-morbidities  that affects occupational performance. Patient will benefit from skilled OT to address above impairments and improve overall function.  MODIFICATION  OR ASSISTANCE TO COMPLETE EVALUATION: Min-Moderate modification of tasks or assist with assess necessary to complete an evaluation.  OT OCCUPATIONAL PROFILE AND HISTORY: Detailed assessment: Review of records and additional review of physical, cognitive, psychosocial history related to current functional performance.  CLINICAL DECISION MAKING: Moderate - several treatment options, min-mod task modification necessary  REHAB POTENTIAL: Good  EVALUATION COMPLEXITY: Moderate    PLAN:  OT FREQUENCY: 2x/week  OT DURATION: 6 weeks  PLANNED INTERVENTIONS: self care/ADL training, therapeutic exercise, therapeutic activity, neuromuscular re-education, manual therapy, passive range of motion, functional mobility training, splinting, electrical stimulation, ultrasound, compression bandaging, moist heat, cryotherapy, patient/family education, psychosocial skills training, energy conservation, coping strategies training, and DME and/or AE instructions  RECOMMENDED OTHER SERVICES: NA  CONSULTED AND AGREED WITH PLAN OF CARE: Patient and family member/caregiver  PLAN FOR NEXT SESSION: Engage in shoulder flexion in sitting,and standing to facilitate increased strength and endurance with ROM, continue with gross grasp tasks, further address wrist flexion/extension and ulnar/radial deviation, supination, and finger flexion and extension.     Rosalio Loud, OTR/L 07/16/2023, 1:54 PM  [facility] [address] [place], [address] [place], [place] [address] Phone # [phone] Fax # [phone]        OUTPATIENT OCCUPATIONAL THERAPY NEURO  Treatment Note  Patient Name: Angela Buchanan MRN: [id number] DOB:[birth year], 72 y.o., female Today's Date: 07/16/2023  PCP: Shon Hale, MD REFERRING PROVIDER: Shon Hale, MD     END OF SESSION:  OT End of Session - 07/16/23 1319     Visit Number 19    Number of Visits 26    Date for OT  Re-Evaluation 07/27/23    Authorization Type [employer]    OT Start Time 1315    OT Stop Time 1351  score by 4 blocks with RUE. Baseline: R: 18 blocks on 06/14/23 Goal status: IN PROGRESS  3.  Pt will demonstrate improved shoulder flexion as needed for IADLs (to place a dish in the cabinet and/or laundry on a shelf at eye level) Baseline:  Goal status: IN PROGRESS  4.  Pt will demonstrate improvement of finger extension by by 10* TAM of index and 15* TAM of long ringer finger to allow for more functional hand opening.  Baseline: -57 IF on 06/14/23 , -55 LF Goal status: IN PROGRESS  5.  Pt will demonstrate improved handwriting with ability to write 10 item simple grocery list with 90% legibility. Baseline:  Goal status: IN PROGRESS  6.  Pt will demonstrate improvements in functional use of dominant RUE as evidenced by improvements on Myrtis Ser and Allie Bossier Dickie La assessments. Baseline: TBD Goal status: IN PROGRESS  ASSESSMENT:  CLINICAL IMPRESSION: Pt demonstrating improvements in shoulder ROM and elbow ROM while engaging in structured reaching tasks.  Pt tolerating increased AAROM with internal/external shoulder rotation and understanding carryover to functional tasks.  Pt continues to demonstrate tightness in shoulder flexion, external rotation, and forearm supination.   PERFORMANCE DEFICITS: in functional skills including ADLs, IADLs, coordination, dexterity, tone, ROM, strength, pain, flexibility, Fine motor control, Gross motor control, mobility, balance, body mechanics, endurance, decreased knowledge of precautions, decreased knowledge of use of DME, and UE functional use and psychosocial skills including environmental adaptation and routines and behaviors.   IMPAIRMENTS: are limiting patient from ADLs and IADLs.   CO-MORBIDITIES: may have co-morbidities  that affects occupational performance. Patient will benefit from skilled OT to address above impairments and improve overall function.  MODIFICATION  OR ASSISTANCE TO COMPLETE EVALUATION: Min-Moderate modification of tasks or assist with assess necessary to complete an evaluation.  OT OCCUPATIONAL PROFILE AND HISTORY: Detailed assessment: Review of records and additional review of physical, cognitive, psychosocial history related to current functional performance.  CLINICAL DECISION MAKING: Moderate - several treatment options, min-mod task modification necessary  REHAB POTENTIAL: Good  EVALUATION COMPLEXITY: Moderate    PLAN:  OT FREQUENCY: 2x/week  OT DURATION: 6 weeks  PLANNED INTERVENTIONS: self care/ADL training, therapeutic exercise, therapeutic activity, neuromuscular re-education, manual therapy, passive range of motion, functional mobility training, splinting, electrical stimulation, ultrasound, compression bandaging, moist heat, cryotherapy, patient/family education, psychosocial skills training, energy conservation, coping strategies training, and DME and/or AE instructions  RECOMMENDED OTHER SERVICES: NA  CONSULTED AND AGREED WITH PLAN OF CARE: Patient and family member/caregiver  PLAN FOR NEXT SESSION: Engage in shoulder flexion in sitting,and standing to facilitate increased strength and endurance with ROM, continue with gross grasp tasks, further address wrist flexion/extension and ulnar/radial deviation, supination, and finger flexion and extension.     Rosalio Loud, OTR/L 07/16/2023, 1:54 PM  [facility] [address] [place], [address] [place], [place] [address] Phone # [phone] Fax # [phone]        OUTPATIENT OCCUPATIONAL THERAPY NEURO  Treatment Note  Patient Name: Angela Buchanan MRN: [id number] DOB:[birth year], 72 y.o., female Today's Date: 07/16/2023  PCP: Shon Hale, MD REFERRING PROVIDER: Shon Hale, MD     END OF SESSION:  OT End of Session - 07/16/23 1319     Visit Number 19    Number of Visits 26    Date for OT  Re-Evaluation 07/27/23    Authorization Type [employer]    OT Start Time 1315    OT Stop Time 1351  OUTPATIENT OCCUPATIONAL THERAPY NEURO  Treatment Note  Patient Name: Angela Buchanan MRN: [id number] DOB:1950-11-22, 72 y.o., female Today's Date: 07/16/2023  PCP: Shon Hale, MD REFERRING PROVIDER: Shon Hale, MD     END OF SESSION:  OT End of Session - 07/16/23 1319     Visit Number 19    Number of Visits 26    Date for OT Re-Evaluation 07/27/23    Authorization Type [employer]    OT Start Time 1315    OT Stop Time 1351   needing to leave early for another appt   OT Time Calculation (min) 36 min    Behavior During Therapy [facility] for tasks assessed/performed                               Past Medical History:  Diagnosis Date   Atypical chest pain 07/19/2021   Bigeminy    no current med.   Breast cancer (HCC) 06/2017   right   Bruises easily    CAD in native artery 10/31/2022   Carotid bruit 11/13/2019   Dental crowns present    History of diverticulitis    Hypertension    states under control with med., has been on med. x 5 yr.   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   Past Surgical History:  Procedure Laterality Date   ABDOMINAL HYSTERECTOMY     partial   BREAST LUMPECTOMY Right 06/22/2017   Malignant   BREAST LUMPECTOMY WITH RADIOACTIVE SEED AND SENTINEL LYMPH NODE BIOPSY Right 06/22/2017   Procedure: RIGHT BREAST LUMPECTOMY WITH RADIOACTIVE SEED AND RIGHT AXILLARY DEEP SENTINEL LYMPH NODE BIOPSY WITH BLUE DYE INJECTION;  Surgeon: Claud Kelp, MD;  Location: [facility];  Service: General;  Laterality: Right;   BREAST LUMPECTOMY WITH RADIOACTIVE SEED LOCALIZATION Left 06/22/2017   Procedure: LEFT BREAST LUMPECTOMY WITH RADIOACTIVE SEED  LOCALIZATION;  Surgeon: Claud Kelp, MD;  Location: [facility];  Service: General;  Laterality: Left;   CATARACT EXTRACTION W/ INTRAOCULAR LENS  IMPLANT, BILATERAL Bilateral    EXCISION OF BREAST BIOPSY Left 06/22/2017   benign   LOOP RECORDER INSERTION N/A 05/16/2022   Procedure: LOOP RECORDER INSERTION;  Surgeon: Lanier Prude, MD;  Location: [facility];  Service: Cardiovascular;  Laterality: N/A;   PERCUTANEOUS PINNING Right 06/22/2022   Procedure: PERCUTANEOUS PINNING OF RIGHT HIP;  Surgeon: Bjorn Pippin, MD;  Location: [facility];  Service: Orthopedics;  Laterality: Right;   TONSILLECTOMY     age 44   Patient Active Problem List   Diagnosis Date Noted   Right spastic hemiplegia (HCC) 12/14/2022   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 Baseline: R: 18 blocks on 06/14/23 Goal status: IN PROGRESS  3.  Pt will demonstrate improved shoulder flexion as needed for IADLs (to place a dish in the cabinet and/or laundry on a shelf at eye level) Baseline:  Goal status: IN PROGRESS  4.  Pt will demonstrate improvement of finger extension by by 10* TAM of index and 15* TAM of long ringer finger to allow for more functional hand opening.  Baseline: -57 IF on 06/14/23 , -55 LF Goal status: IN PROGRESS  5.  Pt will demonstrate improved handwriting with ability to write 10 item simple grocery list with 90% legibility. Baseline:  Goal status: IN PROGRESS  6.  Pt will demonstrate improvements in functional use of dominant RUE as evidenced by improvements on Myrtis Ser and Allie Bossier Dickie La assessments. Baseline: TBD Goal status: IN PROGRESS  ASSESSMENT:  CLINICAL IMPRESSION: Pt demonstrating improvements in shoulder ROM and elbow ROM while engaging in structured reaching tasks.  Pt tolerating increased AAROM with internal/external shoulder rotation and understanding carryover to functional tasks.  Pt continues to demonstrate tightness in shoulder flexion, external rotation, and forearm supination.   PERFORMANCE DEFICITS: in functional skills including ADLs, IADLs, coordination, dexterity, tone, ROM, strength, pain, flexibility, Fine motor control, Gross motor control, mobility, balance, body mechanics, endurance, decreased knowledge of precautions, decreased knowledge of use of DME, and UE functional use and psychosocial skills including environmental adaptation and routines and behaviors.   IMPAIRMENTS: are limiting patient from ADLs and IADLs.   CO-MORBIDITIES: may have co-morbidities  that affects occupational performance. Patient will benefit from skilled OT to address above impairments and improve overall function.  MODIFICATION  OR ASSISTANCE TO COMPLETE EVALUATION: Min-Moderate modification of tasks or assist with assess necessary to complete an evaluation.  OT OCCUPATIONAL PROFILE AND HISTORY: Detailed assessment: Review of records and additional review of physical, cognitive, psychosocial history related to current functional performance.  CLINICAL DECISION MAKING: Moderate - several treatment options, min-mod task modification necessary  REHAB POTENTIAL: Good  EVALUATION COMPLEXITY: Moderate    PLAN:  OT FREQUENCY: 2x/week  OT DURATION: 6 weeks  PLANNED INTERVENTIONS: self care/ADL training, therapeutic exercise, therapeutic activity, neuromuscular re-education, manual therapy, passive range of motion, functional mobility training, splinting, electrical stimulation, ultrasound, compression bandaging, moist heat, cryotherapy, patient/family education, psychosocial skills training, energy conservation, coping strategies training, and DME and/or AE instructions  RECOMMENDED OTHER SERVICES: NA  CONSULTED AND AGREED WITH PLAN OF CARE: Patient and family member/caregiver  PLAN FOR NEXT SESSION: Engage in shoulder flexion in sitting,and standing to facilitate increased strength and endurance with ROM, continue with gross grasp tasks, further address wrist flexion/extension and ulnar/radial deviation, supination, and finger flexion and extension.     Rosalio Loud, OTR/L 07/16/2023, 1:54 PM  [facility] [address] [place], [address] [place], [place] [address] Phone # [phone] Fax # [phone]        OUTPATIENT OCCUPATIONAL THERAPY NEURO  Treatment Note  Patient Name: Angela Buchanan MRN: [id number] DOB:[birth year], 72 y.o., female Today's Date: 07/16/2023  PCP: Shon Hale, MD REFERRING PROVIDER: Shon Hale, MD     END OF SESSION:  OT End of Session - 07/16/23 1319     Visit Number 19    Number of Visits 26    Date for OT  Re-Evaluation 07/27/23    Authorization Type [employer]    OT Start Time 1315    OT Stop Time 1351  OUTPATIENT OCCUPATIONAL THERAPY NEURO  Treatment Note  Patient Name: Angela Buchanan MRN: [id number] DOB:1950-11-22, 72 y.o., female Today's Date: 07/16/2023  PCP: Shon Hale, MD REFERRING PROVIDER: Shon Hale, MD     END OF SESSION:  OT End of Session - 07/16/23 1319     Visit Number 19    Number of Visits 26    Date for OT Re-Evaluation 07/27/23    Authorization Type [employer]    OT Start Time 1315    OT Stop Time 1351   needing to leave early for another appt   OT Time Calculation (min) 36 min    Behavior During Therapy [facility] for tasks assessed/performed                               Past Medical History:  Diagnosis Date   Atypical chest pain 07/19/2021   Bigeminy    no current med.   Breast cancer (HCC) 06/2017   right   Bruises easily    CAD in native artery 10/31/2022   Carotid bruit 11/13/2019   Dental crowns present    History of diverticulitis    Hypertension    states under control with med., has been on med. x 5 yr.   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   Past Surgical History:  Procedure Laterality Date   ABDOMINAL HYSTERECTOMY     partial   BREAST LUMPECTOMY Right 06/22/2017   Malignant   BREAST LUMPECTOMY WITH RADIOACTIVE SEED AND SENTINEL LYMPH NODE BIOPSY Right 06/22/2017   Procedure: RIGHT BREAST LUMPECTOMY WITH RADIOACTIVE SEED AND RIGHT AXILLARY DEEP SENTINEL LYMPH NODE BIOPSY WITH BLUE DYE INJECTION;  Surgeon: Claud Kelp, MD;  Location: [facility];  Service: General;  Laterality: Right;   BREAST LUMPECTOMY WITH RADIOACTIVE SEED LOCALIZATION Left 06/22/2017   Procedure: LEFT BREAST LUMPECTOMY WITH RADIOACTIVE SEED  LOCALIZATION;  Surgeon: Claud Kelp, MD;  Location: [facility];  Service: General;  Laterality: Left;   CATARACT EXTRACTION W/ INTRAOCULAR LENS  IMPLANT, BILATERAL Bilateral    EXCISION OF BREAST BIOPSY Left 06/22/2017   benign   LOOP RECORDER INSERTION N/A 05/16/2022   Procedure: LOOP RECORDER INSERTION;  Surgeon: Lanier Prude, MD;  [facility];  Service: Cardiovascular;  Laterality: N/A;   PERCUTANEOUS PINNING Right 06/22/2022   Procedure: PERCUTANEOUS PINNING OF RIGHT HIP;  Surgeon: Bjorn Pippin, MD;  Location: [facility];  Service: Orthopedics;  Laterality: Right;   TONSILLECTOMY     age 44   Patient Active Problem List   Diagnosis Date Noted   Right spastic hemiplegia (HCC) 12/14/2022   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  OUTPATIENT OCCUPATIONAL THERAPY NEURO  Treatment Note  Patient Name: Angela Buchanan MRN: [id number] DOB:1950-11-22, 72 y.o., female Today's Date: 07/16/2023  PCP: Shon Hale, MD REFERRING PROVIDER: Shon Hale, MD     END OF SESSION:  OT End of Session - 07/16/23 1319     Visit Number 19    Number of Visits 26    Date for OT Re-Evaluation 07/27/23    Authorization Type [employer]    OT Start Time 1315    OT Stop Time 1351   needing to leave early for another appt   OT Time Calculation (min) 36 min    Behavior During Therapy [facility] for tasks assessed/performed                               Past Medical History:  Diagnosis Date   Atypical chest pain 07/19/2021   Bigeminy    no current med.   Breast cancer (HCC) 06/2017   right   Bruises easily    CAD in native artery 10/31/2022   Carotid bruit 11/13/2019   Dental crowns present    History of diverticulitis    Hypertension    states under control with med., has been on med. x 5 yr.   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   Past Surgical History:  Procedure Laterality Date   ABDOMINAL HYSTERECTOMY     partial   BREAST LUMPECTOMY Right 06/22/2017   Malignant   BREAST LUMPECTOMY WITH RADIOACTIVE SEED AND SENTINEL LYMPH NODE BIOPSY Right 06/22/2017   Procedure: RIGHT BREAST LUMPECTOMY WITH RADIOACTIVE SEED AND RIGHT AXILLARY DEEP SENTINEL LYMPH NODE BIOPSY WITH BLUE DYE INJECTION;  Surgeon: Claud Kelp, MD;  Location: [facility];  Service: General;  Laterality: Right;   BREAST LUMPECTOMY WITH RADIOACTIVE SEED LOCALIZATION Left 06/22/2017   Procedure: LEFT BREAST LUMPECTOMY WITH RADIOACTIVE SEED  LOCALIZATION;  Surgeon: Claud Kelp, MD;  Location: [facility];  Service: General;  Laterality: Left;   CATARACT EXTRACTION W/ INTRAOCULAR LENS  IMPLANT, BILATERAL Bilateral    EXCISION OF BREAST BIOPSY Left 06/22/2017   benign   LOOP RECORDER INSERTION N/A 05/16/2022   Procedure: LOOP RECORDER INSERTION;  Surgeon: Lanier Prude, MD;  [facility];  Service: Cardiovascular;  Laterality: N/A;   PERCUTANEOUS PINNING Right 06/22/2022   Procedure: PERCUTANEOUS PINNING OF RIGHT HIP;  Surgeon: Bjorn Pippin, MD;  Location: [facility];  Service: Orthopedics;  Laterality: Right;   TONSILLECTOMY     age 44   Patient Active Problem List   Diagnosis Date Noted   Right spastic hemiplegia (HCC) 12/14/2022   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

## 2023-07-17 DIAGNOSIS — Z23 Encounter for immunization: Secondary | ICD-10-CM | POA: Diagnosis not present

## 2023-07-18 ENCOUNTER — Ambulatory Visit: Payer: Medicare PPO

## 2023-07-18 DIAGNOSIS — R2681 Unsteadiness on feet: Secondary | ICD-10-CM

## 2023-07-18 DIAGNOSIS — R293 Abnormal posture: Secondary | ICD-10-CM

## 2023-07-18 DIAGNOSIS — I69351 Hemiplegia and hemiparesis following cerebral infarction affecting right dominant side: Secondary | ICD-10-CM | POA: Diagnosis not present

## 2023-07-18 DIAGNOSIS — R2689 Other abnormalities of gait and mobility: Secondary | ICD-10-CM | POA: Diagnosis not present

## 2023-07-18 DIAGNOSIS — M6281 Muscle weakness (generalized): Secondary | ICD-10-CM | POA: Diagnosis not present

## 2023-07-18 NOTE — Therapy (Signed)
OUTPATIENT PHYSICAL THERAPY TREATMENT NOTE   Patient Name: Angela Buchanan MRN: [id number] DOB:1951-10-02, 72 y.o., female Today's Date: 07/18/2023  PCP:  Garth Bigness, MD   REFERRING PROVIDER:  Garth Bigness, MD       END OF SESSION:   PT End of Session - 07/18/23 1441     Visit Number 45    Date for PT Re-Evaluation 09/28/23    Authorization Type [employer]    Authorization Time Period 8 visits 8/22-10/18/24-requested more visits    Authorization - Visit Number 7    Authorization - Number of Visits 8    Progress Note Due on Visit 50    PT Start Time 1400    PT Stop Time 1443    PT Time Calculation (min) 43 min    Equipment Utilized During Treatment Gait belt    Activity Tolerance Patient tolerated treatment well    Behavior During Therapy WFL for tasks assessed/performed                         Past Medical History:  Diagnosis Date   Atypical chest pain 07/19/2021   Bigeminy    no current med.   Breast cancer (HCC) 06/2017   right   Bruises easily    CAD in native artery 10/31/2022   Carotid bruit 11/13/2019   Dental crowns present    History of diverticulitis    Hypertension    states under control with med., has been on med. x 5 yr.   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   Past Surgical History:  Procedure Laterality Date   ABDOMINAL HYSTERECTOMY     partial   BREAST LUMPECTOMY Right 06/22/2017   Malignant   BREAST LUMPECTOMY WITH RADIOACTIVE SEED AND SENTINEL LYMPH NODE BIOPSY Right 06/22/2017   Procedure: RIGHT BREAST LUMPECTOMY WITH RADIOACTIVE SEED AND RIGHT AXILLARY DEEP SENTINEL LYMPH NODE BIOPSY WITH BLUE DYE INJECTION;  Surgeon: Claud Kelp, MD;  Location:  [facility];  Service: General;  Laterality: Right;   BREAST LUMPECTOMY WITH RADIOACTIVE SEED LOCALIZATION Left 06/22/2017   Procedure: LEFT BREAST LUMPECTOMY WITH RADIOACTIVE SEED LOCALIZATION;  Surgeon: Claud Kelp, MD;  [facility];  Service: General;  Laterality: Left;   CATARACT EXTRACTION W/ INTRAOCULAR LENS  IMPLANT, BILATERAL Bilateral    EXCISION OF BREAST BIOPSY Left 06/22/2017   benign   LOOP RECORDER INSERTION N/A 05/16/2022   Procedure: LOOP RECORDER INSERTION;  Surgeon: Lanier Prude, MD;  [facility]: [facility];  Service: Cardiovascular;  Laterality: N/A;   PERCUTANEOUS PINNING Right 06/22/2022   Procedure: PERCUTANEOUS PINNING OF RIGHT HIP;  Surgeon: Bjorn Pippin, MD;  Location: [facility];  Service: Orthopedics;  Laterality: Right;   TONSILLECTOMY     age 13   Patient Active Problem List   Diagnosis Date Noted   Right spastic hemiplegia (HCC) 12/14/2022   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   CAD in native artery 10/31/2022   Malnutrition of moderate degree 06/22/2022   Closed fracture of femur, neck (HCC) 06/21/2022   Urinary frequency 06/21/2022   Hypokalemia 06/21/2022   Heart murmur 06/21/2022   Hypothyroidism 06/21/2022   Dysphagia 05/17/2022   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  CVA (cerebral vascular accident) (HCC) 05/12/2022   Osteoporosis 01/24/2022   Atypical chest pain 07/19/2021   Temporomandibular joint (TMJ) pain 07/16/2020   Referred otalgia of both ears 07/16/2020   PAC (premature atrial contraction) 05/11/2020   PVC (premature ventricular contraction) 05/11/2020   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   Carotid bruit 11/13/2019   Pure hypercholesterolemia 11/13/2019   Malignant neoplasm of upper-outer quadrant of right breast in female, estrogen receptor positive (HCC) 05/22/2017   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   Diplopia 09/17/2014   Dizziness and giddiness 09/17/2014    Essential hypertension 07/22/2014   Chest pain 07/22/2014   Family history of heart disease 07/22/2014    REFERRING DIAG:  R29.6 (ICD-10-CM) - Repeated falls, closed fracture of Rt hip    THERAPY DIAG:  Muscle weakness (generalized)  Other abnormalities of gait and mobility  Abnormal posture  Unsteadiness on feet  Rationale for Evaluation and Treatment Rehabilitation  PERTINENT HISTORY: hypertension, breast cancer, hyperlipidemia, Lt hip fracture secondary to fall 06/2022, CVA 06/2022   PRECAUTIONS:  Fall, Rt breast cancer with node removal, osteoporosis    SUBJECTIVE:                                                                                                                                                                                      SUBJECTIVE STATEMENT:  I have been walking more in the community, still limited.    Pt spouse present during visit.   PAIN:  PAIN:  Are you having pain? No     OBJECTIVE: (objective measures completed at initial evaluation unless otherwise dated)  DIAGNOSTIC FINDINGS: Rt hip fracture and ORIF    COGNITION: Overall cognitive status: Within functional limits for tasks assessed                         SENSATION: WFL  POSTURE: rounded shoulders, forward head, flexed trunk , and weight shift left   PALPATION: NA   LOWER EXTREMITY ROM: Rt hip limited by 50%, hamstrings limited by 50% bil.    LOWER EXTREMITY MMT:   MMT Right eval Right  09/27/22 Left eval Right 10/30/22 Right  01/17/23 Right 05/23/23 Left 10/30/22 Left 01/17/23  Hip flexion 3+ 4- 4- 4- 4 4 4- 4+  Hip extension            Hip abduction 3-  4     4  Hip adduction            Hip internal rotation            Hip external rotation  Knee flexion 3- 4- 3+  4 4  4+  Knee extension 3+ 4 4- 4 painful hip 4+ 4+ 4- 4+  Ankle dorsiflexion 3+ 4- 4- 4-   4   Ankle plantarflexion            Ankle inversion            Ankle eversion              (Blank rows = not tested)   FUNCTIONAL TESTS:   Eval: Timed up and go (TUG): 1 min, 27 seconds   09/27/22: 5x sit to stand: 29 seconds with use of Lt hand  TUG: 27 seconds   10/30/22: 5x sit to stand: 23 sec use of LT hand     TUG: 26 sec with standard cane   11/20/22: 5x sit to stand: 18.68 seconds    TUG: 19.45 without device    3 minute walk test: 225 feet with a standard cane   01/10/23: 5x sit to stand: 15.77 seconds   01/17/23: TUG with cane: 15.41 seconds with cane    3 min walk test: 313 feet with cane    03/07/23: 3 min walk 275 feet with single point cane   TUG: 16 seconds   03/14/23: 6 min walk test: 723 feet with standard cane    TUG: 14.16 without cane    5x sit to stand: 12.15 seconds without hand   03/28/23 TUG 11 seconds without cane  5x STS 10.6 seconds no Ues  04/18/2023: 6 min walk test:  532 ft with [facility]  05/09/23: 6 min walk test with SPC: 667 feet - RPE 5  05/23/23: TUG: 11 seconds without cane 5x sit to stand: 12.71 seconds  6 min walk test: 763 feet  06/06/23:  5x sit to stand: 11.98 seconds   07/11/23:  5x sit to stand: 11.47 seconds   Short Physical Performance Battery:    Balance:       Side by side stance:    points (1)-1  Semi -tandem:     points (1)-1  Tandem:      points (2)-2    Gait Speed: (90m=9.84 feet) done 2x take the best time:      points                                           6.31 seconds-2 pts     Repeated Chair Stands:      points   (timer stopped when straight on 5th stand)                                             9.69 seconds- 4 pts  Total score=  10/12  points <10/12 predictive of 1 or more mobility limitations and increased risk of mobility disability 6 or less/12 associated with a higher fall rate         07/18/23: 6 min walk test-674 feet  TODAY'S TREATMENT:   DATE:  07/18/23 Nustep level 3 x10 minutes with LE only. >55 spm PT present  throughout to discuss status Farmers carry 6# Lt UE from gym mat table to mirror 2 laps Alternating step tap on 6" step 2x10 with CGA Sit to stand with 6# weight: 2x10 Leg Press (seat at 5) 55# 2x10, single leg: Lt 35#, Rt 15# 2x10 bil each Step to over hurdles with hand held assist on the Lt: Lt lead and Rt lead with verbal cues for step height 6 min walk test: 674 feet   DATE:  07/11/23 Nustep level 3 x10 minutes with LE only. >55 spm PT present throughout to discuss status Farmers carry 6# Lt UE from gym mat table to mirror 2 laps Alternating step tap on 6" step 2x10 with CGA Leg Press (seat at 5) 55# 2x10, single leg: Lt 35#, Rt 15# 2x10 bil each Step to over hurdles with hand held assist on the Lt: Lt lead and Rt lead with verbal cues for step height  DATE:  07/04/23 Nustep level 3 x10 minutes with LE only. >55 spm PT present throughout to discuss status Farmers carry 6# Lt UE from gym mat table to mirror 2 laps Alternating step tap on 6" step 2x10 with CGA Leg Press (seat at 5) 55# 2x10, single leg: Lt 35#, Rt 15# 2x10 bil each Sit to stand: 6# 2x10 Step to over hurdles with hand held assist on the Lt: Lt lead and Rt lead with verbal cues for step height Ambulation around clinic for 4 minutes using a straight cane.    PATIENT EDUCATION:  Education details: Access Code: [email or web address] Person educated: Patient Education method: Explanation, Facilities manager, and Handouts Education comprehension: verbalized understanding and returned demonstration   HOME EXERCISE PROGRAM: Access Code: [email or web address] URL: [email or web address] Date: 09/27/2022 Prepared by: Tresa Endo  Exercises - Seated Long Arc Quad  - 3 x daily - 7 x weekly - 2 sets - 10 reps - 5 hold - Seated March   - 3 x daily - 7 x weekly - 3 sets - 10 reps - Seated Heel Toe Raises   - 3 x daily - 7 x weekly - 2 sets - 10 reps - Seated Isometric Hip Adduction with Ball  - 3 x daily - 7 x weekly - 2 sets - 10 reps - Sit to  Stand with Armchair  - 2 x daily - 7 x weekly - 2 sets - 5-10 reps - Standing Hip Abduction with Counter Support  - 1 x daily - 7 x weekly - 2 sets - 10 reps - Heel Raises with Counter Support  - 2 x daily - 7 x weekly - 2 sets - 10 reps  Patient Education - Walking with a Single [employer] - Modified 4 Point Gait Pattern  ASSESSMENT:   CLINICAL IMPRESSION:     Pt continues to make steady, yet slow gains s/p CVA that was complicated by hip fracture and ORIF shortly after. Today's treatment session focused on general strengthening and balance. Pt scored 10/12 on short physical performance battery test which is borderline of increased risk of mobility disability.  She continues to be more independent with exercises in the clinic and requires closer supervision with higher level tasks.  She reports that she is walking more in the community, up to 30-45 min with her dog or in a store.  Her gait speed is slow and Rt LE weakness secondary to CVA puts her at falls risk  due to difficulty clearing the Rt foot when she is fatigued. Pt will benefit from skilled therapy to work on strength and mobility to reduce falls risk.   Patient tolerated treatment session well.  Patient will benefit from skilled PT to address the below impairments and improve overall function.    OBJECTIVE IMPAIRMENTS: Abnormal gait, decreased activity tolerance, decreased balance, decreased mobility, difficulty walking, decreased strength, decreased safety awareness, impaired perceived functional ability, impaired flexibility, postural dysfunction, and pain.    ACTIVITY LIMITATIONS: carrying, lifting, sitting, standing, stairs, transfers, hygiene/grooming, and locomotion level   PARTICIPATION LIMITATIONS: meal prep, cleaning, laundry, driving, shopping, and community activity   PERSONAL FACTORS: Age, Past/current experiences, and 1-2 comorbidities: CVA, falls, Rt hip fracture with ORIF  are also affecting patient's functional outcome.     REHAB POTENTIAL: Good   CLINICAL DECISION MAKING: Evolving/moderate complexity   EVALUATION COMPLEXITY: Moderate     GOALS: Goals reviewed with patient? Yes   SHORT TERM GOALS: Target date: 09/06/2022   Be independent in initial HEP Baseline: Goal status: Goal met 08/14/22   2.  Improve LE strength to perform sit to stand with moderate Lt UE support Baseline:  Goal status: Goal met 08/28/22  3.  Perform TUG in < or = to 60 seconds to reduce falls risk Baseline: 27 seconds (09/27/22) Goal status: MET      LONG TERM GOALS: Target date:  09/28/23   Be independent in advanced HEP Baseline: independent in current HEP and has tolerated progress when appropriate (07/18/23)  Goal status: in progress    2.  Perform TUG in < or = to 13 seconds to reduce falls risk Baseline: 11 seconds without cane (05/23/23) Goal status: met   3.  ambulate > or = to 750 feet in 6 minutes to improve community ambulation and independence  Baseline: this has fluctuated between 674-763 feet- not consistent ( 07/18/23) Goal status: revised     4.  perform 5x sit to stand in < or = to 12 seconds to reduce falls risk  Baseline: 11.47 seconds (07/11/23)  Goal Status: MET  5. Improve balance to walk her dog short distances with independence   Baseline: walking with dog now, slowly (07/18/23)  Goal status: MET      6. Improve gait speed to perform 73m walk in < or = to 3.6 seconds    Baseline: 6.31 seconds (07/18/23)    Goal Status: NEW   PLAN:   PT FREQUENCY: 1x/week   PT DURATION: 10 weeks   PLANNED INTERVENTIONS: Therapeutic exercises, Therapeutic activity, Neuromuscular re-education, Balance training, Gait training, Patient/Family education, Self Care, Joint mobilization, Stair training, Dry Needling, Electrical stimulation, Cryotherapy, Moist heat, Taping, Manual therapy, and Re-evaluation   PLAN FOR NEXT SESSION: Continue to work on gait , strength and balance. Endurance, change of  direction.    Lorrene Reid, PT 07/18/23 2:43 PM   [facility] [address] [place], [place] [address] Phone # [phone] Fax [phone]

## 2023-07-19 ENCOUNTER — Encounter: Payer: Self-pay | Admitting: Physical Medicine & Rehabilitation

## 2023-07-19 ENCOUNTER — Encounter: Payer: Medicare PPO | Attending: Physical Medicine & Rehabilitation | Admitting: Physical Medicine & Rehabilitation

## 2023-07-19 VITALS — BP 127/79 | HR 55 | Ht 61.0 in | Wt 101.0 lb

## 2023-07-19 DIAGNOSIS — G8111 Spastic hemiplegia affecting right dominant side: Secondary | ICD-10-CM | POA: Insufficient documentation

## 2023-07-19 NOTE — Patient Instructions (Signed)
You have a loop recorder which is like a microphone, looking wt the heart rhythm.   You do not have a defibrillator  The loop recorder

## 2023-07-19 NOTE — Progress Notes (Signed)
Subjective:    Patient ID: Angela Buchanan, female    DOB: Oct 08, 1950, 72 y.o.   MRN: [id number] 72 y.o. female with a history of hypertension, HLD, atypical CP, ventricular bigeminy/PAC/PVC, right breast cancer and carotid bruit, who presented to [facility] with acute right sided weakness and difficulty speaking. She stated that she was writing and suddenly was unable to use her right hand to write and then with difficulty speaking and  right-sided weakness and numbness with difficulty walking which resulted in the decision to be seen in the ED where a Code Stroke was called. NIHSS 4. tNK was administered. Cleviprex was initiated for BP 180/87. CTA showed  No large vessel occlusion, hemodynamically significant stenosis, or evidence of dissection. CTH negative for acute finding. Moderate stenosis left P1-P2 PCA junctio. The patient was then sent to [facility] for post-TNK management and stroke work up.   Date of Admission: 05/12/2022 Date of Discharge: 05/17/2022 HPI  72 year old female here for vivi stim, vagal nerve stimulation evaluation.  The patient had her stroke greater than 6 months ago.  Her stroke was ischemic.  She has been in both inpatient rehabilitation PT OT as well as outpatient and continues to receive services.  The patient continues have impairment of ADLs mainly more complex tasks.  She states that she does not have any significant numbness in her hand. She has a history of loop recorder placement.  No history of vagotomy no history of radiation therapy or surgery to the neck area. She has tried botulinum toxin last spring but had some finger weakness that occurred with treatment of the finger flexor muscles.  This did improve however she did not wish to undergo the procedure once again. MRI HEAD WITHOUT CONTRAST   TECHNIQUE: Multiplanar, multiecho pulse sequences of the brain and surrounding structures were obtained without intravenous contrast.   COMPARISON:  CT head without contrast and CTA  head and neck 05/12/2022   FINDINGS: Brain: Acute/subacute nonhemorrhagic infarct present in the left corona radiata along the lateral margin of the left ventricle. The infarcted territory measures 21 x 15 x 12 mm.   Moderate atrophy and confluent periventricular T2 hyperintensity is present separate from the acute infarct. The ventricles are proportionate to the degree of atrophy. No significant extraaxial fluid collection is present. The internal auditory canals are within normal limits. The brainstem and cerebellum are within normal limits.   Vascular: Flow is present in the major intracranial arteries.   Skull and upper cervical spine: The craniocervical junction is normal. Upper cervical spine is within normal limits. Marrow signal is unremarkable.   Sinuses/Orbits: The paranasal sinuses and mastoid air cells are clear. Bilateral lens replacements are noted. Globes and orbits are otherwise unremarkable.   IMPRESSION: 1. Acute/subacute nonhemorrhagic infarct involving the left corona radiata along the lateral margin of the left ventricle. 2. Moderate atrophy and confluent periventricular white matter disease otherwise likely reflects the sequela of chronic microvascular ischemia.   The above was relayed via text pager to Dr. Wilford Corner On 05/13/2022 at 19:47 .     Electronically Signed   By: Marin Roberts M.D.   On: 05/13/2022 19:47   Current OP PT, OT No hx of neck surgery or radiation therapy    01/11/23 Right   Pronator 50U FDS 25 FDP 25 FCR 25 Biceps 75 Pain Inventory Average Pain 5 Pain Right Now 5 My pain is aching  In the last 24 hours, has pain interfered with the following? General activity 5 Relation  with others 5 Enjoyment of life 5 What TIME of day is your pain at its worst? evening Sleep (in general) Fair  Pain is worse with:  . Pain improves with:  . Relief from Meds:  Fair  Family History  Problem Relation Age of Onset    Hypertension Sister    Dementia Father    Stroke Mother    Stroke Maternal Grandmother    Stroke Maternal Grandfather    Stroke Paternal Grandmother    Stroke Paternal Grandfather    Stroke Sister    Stroke Brother    Dementia Brother    Heart attack Brother    Social History   Socioeconomic History   Marital status: Married    Spouse name: Not on file   Number of children: 0   Years of education: college   Highest education level: Not on file  Occupational History   Occupation: [employer] features editor: [employer]  Tobacco Use   Smoking status: Never   Smokeless tobacco: Never  Vaping Use   Vaping status: Never Used  Substance and Sexual Activity   Alcohol use: No    Alcohol/week: 0.0 standard drinks of alcohol   Drug use: No   Sexual activity: Never    Birth control/protection: Surgical    Comment: hyst  Other Topics Concern   Not on file  Social History Narrative   Patient lives at home with husband  ([name]).    Patient is rtired caoll   Right handed.   Caffeine 8oz . Caffeine daily   Social Determinants of Health   Financial Resource Strain: Not on file  Food Insecurity: No Food Insecurity (06/21/2022)   Hunger Vital Sign    Worried About Running Out of Food in the Last Year: Never true    Ran Out of Food in the Last Year: Never true  Transportation Needs: No Transportation Needs (06/21/2022)   PRAPARE - Administrator, Civil Service (Medical): No    Lack of Transportation (Non-Medical): No  Physical Activity: Not on file  Stress: Not on file  Social Connections: Not on file   Past Surgical History:  Procedure Laterality Date   ABDOMINAL HYSTERECTOMY     partial   BREAST LUMPECTOMY Right 06/22/2017   Malignant   BREAST LUMPECTOMY WITH RADIOACTIVE SEED AND SENTINEL LYMPH NODE BIOPSY Right 06/22/2017   Procedure: RIGHT BREAST LUMPECTOMY WITH RADIOACTIVE SEED AND RIGHT AXILLARY DEEP SENTINEL LYMPH NODE BIOPSY WITH BLUE DYE INJECTION;   Surgeon: Claud Kelp, MD;  Location: [facility];  Service: General;  Laterality: Right;   BREAST LUMPECTOMY WITH RADIOACTIVE SEED LOCALIZATION Left 06/22/2017   Procedure: LEFT BREAST LUMPECTOMY WITH RADIOACTIVE SEED LOCALIZATION;  Surgeon: Claud Kelp, MD;  Location: [facility];  Service: General;  Laterality: Left;   CATARACT EXTRACTION W/ INTRAOCULAR LENS  IMPLANT, BILATERAL Bilateral    EXCISION OF BREAST BIOPSY Left 06/22/2017   benign   LOOP RECORDER INSERTION N/A 05/16/2022   Procedure: LOOP RECORDER INSERTION;  Surgeon: Lanier Prude, MD;  Location: [facility];  Service: Cardiovascular;  Laterality: N/A;   PERCUTANEOUS PINNING Right 06/22/2022   Procedure: PERCUTANEOUS PINNING OF RIGHT HIP;  Surgeon: Bjorn Pippin, MD;  Location: [facility];  Service: Orthopedics;  Laterality: Right;   TONSILLECTOMY     age 56   Past Surgical History:  Procedure Laterality Date   ABDOMINAL HYSTERECTOMY     partial   BREAST LUMPECTOMY Right 06/22/2017  Malignant   BREAST LUMPECTOMY WITH RADIOACTIVE SEED AND SENTINEL LYMPH NODE BIOPSY Right 06/22/2017   Procedure: RIGHT BREAST LUMPECTOMY WITH RADIOACTIVE SEED AND RIGHT AXILLARY DEEP SENTINEL LYMPH NODE BIOPSY WITH BLUE DYE INJECTION;  Surgeon: Claud Kelp, MD;  Location: [facility];  Service: General;  Laterality: Right;   BREAST LUMPECTOMY WITH RADIOACTIVE SEED LOCALIZATION Left 06/22/2017   Procedure: LEFT BREAST LUMPECTOMY WITH RADIOACTIVE SEED LOCALIZATION;  Surgeon: Claud Kelp, MD;  Location: [facility];  Service: General;  Laterality: Left;   CATARACT EXTRACTION W/ INTRAOCULAR LENS  IMPLANT, BILATERAL Bilateral    EXCISION OF BREAST BIOPSY Left 06/22/2017   benign   LOOP RECORDER INSERTION N/A 05/16/2022   Procedure: LOOP RECORDER INSERTION;  Surgeon: Lanier Prude, MD;  Location: [facility];  Service: Cardiovascular;  Laterality: N/A;    PERCUTANEOUS PINNING Right 06/22/2022   Procedure: PERCUTANEOUS PINNING OF RIGHT HIP;  Surgeon: Bjorn Pippin, MD;  Location: [facility];  Service: Orthopedics;  Laterality: Right;   TONSILLECTOMY     age 51   Past Medical History:  Diagnosis Date   Atypical chest pain 07/19/2021   Bigeminy    no current med.   Breast cancer (HCC) 06/2017   right   Bruises easily    CAD in native artery 10/31/2022   Carotid bruit 11/13/2019   Dental crowns present    History of diverticulitis    Hypertension    states under control with med., has been on med. x 5 yr.   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   BP 127/79   Pulse (!) 55   Ht 5\' 1"  (1.549 m)   Wt 101 lb (45.8 kg)   LMP  (LMP Unknown)   SpO2 98%   BMI 19.08 kg/m   Opioid Risk Score:   Fall Risk Score:  `1  Depression screen PHQ 2/9     02/22/2023    1:21 PM 01/11/2023    1:20 PM 12/14/2022    1:24 PM 07/31/2017    1:48 PM  Depression screen PHQ 2/9  Decreased Interest 0 0 0 0  Down, Depressed, Hopeless 0 0 0 0  PHQ - 2 Score 0 0 0 0  Altered sleeping   0   Tired, decreased energy   0   Change in appetite   0   Feeling bad or failure about yourself    0   Trouble concentrating   0   Moving slowly or fidgety/restless   0   Suicidal thoughts   0   PHQ-9 Score   0      Review of Systems  Musculoskeletal:        Right arm pain  All other systems reviewed and are negative.     Objective:   Physical Exam  Sensation intact to LT and proprioception  Tone MAS 1 in the wrist and finger flexors MAS 1 in the elbow flexors. Motor strength is 3 - at the deltoid elbow flexors elbow extensors finger flexors and extensors. Ambulates without assist device no evidence of toe drag  or knee instability Speech with mild dysarthria and mild aphasia.     Assessment & Plan:   #1.  Left subcortical infarct with right hemiparesis.  Her tone is not excessive.  She has no significant sensory findings.  Her cognition is adequate to give consent as well as cooperate with a more aggressive rehabilitation program postoperative.  She does not have any obvious exclusion criteria for the procedure. I do think she would benefit from OT Fugl-Meyer assessment and if she is within the desired range make referral to Dr. Chancy Hurter neurosurgery and spine

## 2023-07-23 ENCOUNTER — Ambulatory Visit: Payer: Medicare PPO | Admitting: Occupational Therapy

## 2023-07-23 DIAGNOSIS — R278 Other lack of coordination: Secondary | ICD-10-CM | POA: Diagnosis not present

## 2023-07-23 DIAGNOSIS — I69351 Hemiplegia and hemiparesis following cerebral infarction affecting right dominant side: Secondary | ICD-10-CM

## 2023-07-23 DIAGNOSIS — M25611 Stiffness of right shoulder, not elsewhere classified: Secondary | ICD-10-CM | POA: Diagnosis not present

## 2023-07-23 DIAGNOSIS — M6281 Muscle weakness (generalized): Secondary | ICD-10-CM

## 2023-07-23 DIAGNOSIS — M79601 Pain in right arm: Secondary | ICD-10-CM | POA: Diagnosis not present

## 2023-07-23 NOTE — Therapy (Signed)
OUTPATIENT OCCUPATIONAL THERAPY NEURO  Treatment Note & Progress Note  Patient Name: Angela Buchanan MRN: [id number] DOB:October 13, 1950, 72 y.o., female Today's Date: 07/23/2023  PCP: Shon Hale, MD REFERRING PROVIDER: Shon Hale, MD  Occupational Therapy Progress Note  Dates of Reporting Period: 06/05/23 to 07/23/23  Objective Reports of Subjective Statement: Pt is making good progress towards goals.  Pt with improved shoulder flexion and external rotation.  Pt is beginning to slow down in her progress and has been recommended for Vivistim.  Fugyl-Meyer assessment completed today with pt qualifying for Vivistim based on her scores, therefore encouraged pt to continue to pursue answers to her questions/concerns about Vivistim process   Reason Skilled Services are Required: Pt will benefit from continued OT services to address functional ROM and initiate Vivistim protocol if pt decides to pursue.    END OF SESSION:  OT End of Session - 07/23/23 1601     Visit Number 20    Number of Visits 26    Date for OT Re-Evaluation 07/27/23    Authorization Type [employer]    OT Start Time 1315    OT Stop Time 1403    OT Time Calculation (min) 48 min    Behavior During Therapy WFL for tasks assessed/performed                                Past Medical History:  Diagnosis Date   Atypical chest pain 07/19/2021   Bigeminy    no current med.   Breast cancer (HCC) 06/2017   right   Bruises easily    CAD in native artery 10/31/2022   Carotid bruit 11/13/2019   Dental crowns present    History of diverticulitis    Hypertension    states under control with med., has been on med. x 5 yr.   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   Past Surgical History:  Procedure Laterality Date   ABDOMINAL HYSTERECTOMY     partial   BREAST LUMPECTOMY Right 06/22/2017   Malignant   BREAST LUMPECTOMY WITH RADIOACTIVE SEED AND SENTINEL LYMPH NODE BIOPSY Right 06/22/2017   Procedure: RIGHT BREAST LUMPECTOMY WITH RADIOACTIVE SEED AND RIGHT AXILLARY DEEP SENTINEL LYMPH NODE BIOPSY WITH BLUE DYE INJECTION;  Surgeon: Claud Kelp, MD;  Location: [facility];  Service: General;  Laterality: Right;   BREAST LUMPECTOMY WITH RADIOACTIVE SEED LOCALIZATION Left 06/22/2017   Procedure: LEFT BREAST LUMPECTOMY WITH RADIOACTIVE SEED LOCALIZATION;  Surgeon: Claud Kelp, MD;  Location: [facility];  Service: General;  Laterality: Left;   CATARACT EXTRACTION W/ INTRAOCULAR LENS  IMPLANT, BILATERAL Bilateral    EXCISION OF BREAST BIOPSY Left 06/22/2017   benign   LOOP RECORDER INSERTION N/A 05/16/2022   Procedure: LOOP RECORDER INSERTION;  Surgeon: Lanier Prude, MD;  Location: [facility];  Service: Cardiovascular;  Laterality: N/A;   PERCUTANEOUS PINNING Right 06/22/2022   Procedure: PERCUTANEOUS PINNING OF RIGHT HIP;  Surgeon: Bjorn Pippin, MD;  Location: [facility];  Service: Orthopedics;  Laterality: Right;   TONSILLECTOMY     age 85   Patient Active Problem List   Diagnosis Date Noted   Right spastic  hemiplegia (HCC) 12/14/2022   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   CAD in native artery 10/31/2022   Malnutrition of moderate degree 06/22/2022   Closed fracture of femur, neck (HCC) 06/21/2022   Urinary frequency 06/21/2022   Hypokalemia 06/21/2022   Heart murmur 06/21/2022   Hypothyroidism 06/21/2022   Dysphagia 05/17/2022   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   CVA (cerebral vascular accident) (HCC) 05/12/2022   Osteoporosis 01/24/2022   Atypical chest pain 07/19/2021   Temporomandibular joint (TMJ) pain 07/16/2020   Referred otalgia of both ears  07/16/2020   PAC (premature atrial contraction) 05/11/2020   PVC (premature ventricular contraction) 05/11/2020   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   Carotid bruit 11/13/2019   Pure hypercholesterolemia 11/13/2019   Malignant neoplasm of upper-outer quadrant of right breast in female, estrogen receptor positive (HCC) 05/22/2017   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   Diplopia 09/17/2014   Dizziness and giddiness 09/17/2014   Essential hypertension 07/22/2014   Chest pain 07/22/2014   Family history of heart disease 07/22/2014    ONSET DATE: 05/13/22 (new referral 04/09/23)  REFERRING DIAG: G83.23 (ICD-10-CM) - 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   Pt accompanied by: self and significant other ([name])  PERTINENT HISTORY: hypertension, breast cancer, hyperlipidemia, Lt hip fracture secondary to fall 06/2022, CVA 05/2022    PRECAUTIONS: Fall, Rt breast cancer with node removal, osteoporosis   WEIGHT BEARING RESTRICTIONS: No  PAIN:  Are you having pain? No  FALLS: Has patient fallen in last 6 months? No  LIVING ENVIRONMENT: Lives with: lives with their spouse Lives in: House/apartment Stairs: Yes: External: 2 steps; on left going up Has following equipment at home: Quad cane large base, Environmental consultant - 2 wheeled, Hemi walker, Wheelchair (manual), shower chair, and Grab bars  PLOF: Independent and Independent with basic ADLs  PATIENT GOALS: to have better use my arm and hand  OBJECTIVE:   HAND DOMINANCE: Right  ADLs:  Transfers/ambulation  related to ADLs: Utilizes [facility] for mobility in the community, 80% in the house but will walk without occasionally Eating: husband cuts foods, utilizing built up handles for self-feeding Grooming: needs assistance with washing hair UB Dressing: needs assistance with fastening bra LB Dressing: Mod-max assist for donning pants due to fear of falling, able to don socks and slip on shoes - unable to tie shoes Toileting: Mod I Bathing: Supervision Tub Shower transfers: Supervision, but able to complete transfers with use of grab bars Equipment: Shower seat with back, Grab bars, and Walk in shower  IADLs: Handwriting:  TBA at next session  MOBILITY STATUS: Needs Assist: utilized [facility] for mobility and Hx of falls  POSTURE COMMENTS:  rounded shoulders and forward head  ACTIVITY TOLERANCE: Activity tolerance: fatigues with increased mobility and effort  FUNCTIONAL OUTCOME MEASURES: Upper Extremity Functional Scale (UEFS): TBD  UPPER EXTREMITY ROM:    Active ROM Right eval Right 05/31/23 Right 06/14/23  Shoulder flexion 87 93 98  Shoulder abduction 78 82 86  Shoulder adduction     Shoulder extension     Shoulder internal rotation 90% 100% 100%  Shoulder external rotation 50% 75% 95% (requires increased  time)  Elbow flexion 132 137 138  Elbow extension -17 -12 -12  Wrist flexion 19 40 48  Wrist extension 35 35 40  Wrist ulnar deviation 15 10 15   Wrist radial deviation 13 15 15   Wrist pronation WNL  WNL  Wrist supination 20 from neutral 65* 65  (Blank rows = not tested)  UPPER EXTREMITY MMT:     MMT Right eval Left eval  Shoulder flexion    Shoulder abduction    Shoulder adduction    Shoulder extension    Shoulder internal rotation    Shoulder external rotation    Middle trapezius    Lower trapezius    Elbow flexion    Elbow extension    Wrist flexion    Wrist extension    Wrist ulnar deviation    Wrist radial deviation    Wrist pronation    Wrist supination     (Blank rows = not tested)    Active ROM Extension (as flexion to loose gross grasp) Right eval Right 05/31/23 Right 06/14/23  Thumb MCP (0-60)     Thumb IP (0-80)     Thumb Radial abd/add (0-55)     Thumb Palmar abd/add (0-45)     Thumb opposition to index     Index MCP (0-90) 0  0  Index PIP (0-100) -30 -50 -22  Index DIP (0-70) -40 -35 -35  Long MCP (0-90) -5 0 0  Long PIP (0-100) -45 -35 -40  Long DIP (0-70) -5 -25 -15  Ring MCP (0-90_ 0 0   Ring PIP (0-100) -40 -40   Ring DIP (0-70) -20 -20   Little MCP (0-90) -7 0   Little PIP (0-100) -25 -25   Little DIP (0-70) -5 0   (Blank rows = not tested)    HAND FUNCTION: 05/31/23 Grip strength: Right: 9#  Grip strength: Right: 7,9 = 8# lbs; Left: 30 lbs  COORDINATION: 05/31/23 9 Hole Peg test: Right: able to pick up one peg with thumb and long finger, however unable to rotate to place into peg board Box and Blocks:  Right 15 blocks  Finger Nose Finger test: slow and deliberate with RUE, but able to touch both nose and finger at this time 9 Hole Peg test: Right: unable to pick up any pegs with RUE  Box and Blocks:  Right 16 blocks, Left 55 blocks  SENSATION: Light touch: WFL Hot/Cold: WFL  EDEMA: no swelling on eval  MUSCLE TONE: RUE: Mild and Hypertonic  COGNITION: Overall cognitive status: Within functional limits for tasks assessed  VISION: Subjective report: pt reports wearing glasses more since CVA Baseline vision: Wears glasses all the time, double vision after cataract removal Visual history: cataracts  OBSERVATIONS: Pt attempting grasp and pinch activities, frequently not incorporating index finger on R with increased flexion away from task.   TODAY'S TREATMENT:                                                           07/23/23  FUGL-MEYER ASSESSMENT   A. Upper Extremity  I. Reflex Activity: Flexors (elbow) 2  Extensors (triceps) 2  Total 4/4     II. Volitional Movement within  Synergies: Flexor Synergy:   Shoulder Retraction 1  Shoulder Elevation 1  Shoulder Abduction 1  External  Rotation 1  Elbow Flexion 2  Forearm Supination 1  Extensor Synergy:   Shoulder Adduction/Internal Rotation 1  Elbow Extension 2  Forearm Pronation 2  Total 12/18   III. Volitional Mixing of Synergies: Hand to Lumbar Spine 2  Shoulder Flexion 1  Pronation/Supination 1  Total 4/6   IV. Volitional Movement with Little to No Synergy Shoulder Abduction 0  Shoulder Flexion 90+ 0  Pronation/Supination 1  Total 1/6    B. Wrist Stability of Wrist at 15* 1  Repeated Flex/Ext 1  Circumduction 1  Stability at 15*Wrist, 0*Elbow 0  Flexion/Extension - Elbow 0* 1  Total 4/10   C. Hand Mass Flexion 1  Mass Extension 1  Total 2/4    D. Grasp Hook  2  Thumb Adduction - paper 2  Pincer - pen 0  Cylindrical - cup 0  Spherical - ball 0  Total 4/10   E. Coordination/Speed Tremor 1  Dysmetria 2  Time 0  Total 3/6    FUGL-MEYER ASSESSMENT: UPPER EXTREMITY A-E TOTAL: 34/66      07/16/23 UE ROM: Engaged in grasp and shoulder internal/external rotation with rainbow arc.  Pt initially grasping rings in internal rotation and pronation, therefore OT encouraging pt to pick up with arm in neutral position to facilitate increased supination then transitioning into pronation, as well as internal/external rotation.  Transitioned to reaching down to lower surface to picking up ring and then place on vertical target to facilitate elbow and shoulder extension and shoulder flexion.   Forearm ROM: engaged in forearm supination/pronation with flipping over small rings, however pt utilizing shoulder hike and flexion therefore modified task.  Transitioned to supination/pronation with 1# dumbbell with focus on motor control with pronation and increased ROM with supination.   07/09/23 Supine UE ROM: engaged in shoulder flexion, abduction/adduction, internal/external rotation, and elbow  flexion/extension while in abduction in supine for increased scapular support and gravity-minimized/eliminated positioning.  OT providing min cues for technique and tactile cues at R elbow and wrist during internal/external rotation to facilitate increased ROM. Engaged in chest press and shoulder flexion with dowel to further facilitate increased ROM. OT providing intermittent tapping at R elbow to facilitate increased elbow extension during chest press.   Sitting UE ROM: engaged in functional reach across midline with RUE towards knee to facilitate increased elbow extension, then abduction and forearm rotation to reach bring "phone" to ear.   Wrist ROM: OT reviewed typical ROM for ulnar and radial deviation and encouraged PROM to resistance with ulnar deviation and to sustain hold ~5 seconds for increased ROM.      07/05/23 Vivistim: engaged in discussion about vivistim with pt reporting that she has appt with PM&R regarding possibility.  Pt still with questions and concerns.  Plan to complete fugyl-meyer assessment at next visit. Flex bar: utilized red flexbar with focus on wrist flexion/extension and ulnar/radial deviation. OT hand over hand to facilitate proper positioning and increased ROM with ulnar/radial deviation.  Pt demonstrating decreased strength and sustained grasp especially with radial deviation.   OT down graded task to completing with towel.  Engaged in rotation to facilitate wrist flexion/extension and ulnar/radial deviation to facilitate ROM without resistance.  Pt with increased tolerance and ROM with removal of resistance.   Golda Acre: IADL Scale  A. Ability to Use Telephone  Operates telephone on own initiative-looks up and dials numbers, etc. - 1  B. Shopping Needs to be accompanied on any shopping trip - 0  C. Food Preparation  Needs to have meals prepared and served - 0   D. Housekeeping Performs light daily tasks such as dish washing, bed making - 1  E.  Laundry  Does Engineer, manufacturing - 1  F. Mode of Transportation  Travel limited to taxi or automobile with assistance of another - 0  G. Responsibility for Own Medications Is responsible for taking medication in correct dosages at correct time - 1  H. Ability to [employer] of handling money - 0   TOTAL LAWTON BRODY SCORE: 4/8  -----------------------------------------------------------  Myrtis Ser Index of Independence in ADL INDEPENDENCE (1) DEPENDENCE (0)  NO supervision, direction or personal assistance WITH supervision, direction, personal assistance or total care   ACTIVITIES 1 or 0  Bathing 1  Dressing 0  Toileting 1  Transferring 1  Continence 1  Feeding 1    KATZ TOTAL: 5/6     PATIENT EDUCATION: Education details: ongoing condition specific education. Person educated: Patient and Spouse Education method: Explanation, Demonstration, and Verbal cues Education comprehension: verbalized understanding and needs further education  HOME EXERCISE PROGRAM: Access Code: [email or web address] URL: [email or web address] Date: 04/23/2023 Prepared by: [facility] - Outpatient  Rehab - [facility]    GOALS: Goals reviewed with patient? Yes  SHORT TERM GOALS: Target date: 07/06/23  Pt and spouse will be independent with advanced HEP for ROM, strengthening, and coordination.  Baseline: Goal status: IN PROGRESS  2.  Pt will verbalize understanding of task modifications and/or potential AE needs to increase ease, safety, and independence w/ ADLs.  Baseline: Goal status: IN PROGRESS  LONG TERM GOALS: Target date: 07/27/23  Pt will demonstrate improved functional grasp in RUE by 5# to open new jar, increased handwriting, and self-feeding.  Baseline: R: 9# on 05/31/23 Goal status: IN PROGRESS  2.  Pt will demonstrate improved UE functional use for ADLs as evidenced by increasing box/ blocks score by 4 blocks with RUE. Baseline: R: 18 blocks on  06/14/23 Goal status: IN PROGRESS  3.  Pt will demonstrate improved shoulder flexion as needed for IADLs (to place a dish in the cabinet and/or laundry on a shelf at eye level) Baseline:  Goal status: IN PROGRESS  4.  Pt will demonstrate improvement of finger extension by by 10* TAM of index and 15* TAM of long ringer finger to allow for more functional hand opening.  Baseline: -57 IF on 06/14/23 , -55 LF Goal status: IN PROGRESS  5.  Pt will demonstrate improved handwriting with ability to write 10 item simple grocery list with 90% legibility. Baseline:  Goal status: IN PROGRESS  6.  Pt will demonstrate improvements in functional use of dominant RUE as evidenced by improvements on Myrtis Ser and Allie Bossier Dickie La assessments. Baseline: TBD Goal status: IN PROGRESS  ASSESSMENT:  CLINICAL IMPRESSION: Fugyl-Meyer assessment completed today with pt qualifying for Vivistim based on her scores, therefore encouraged pt to continue to pursue answers to her questions/concerns about Vivistim process. Pt scored 34/66 on Fugyl-Meyer. Vivistim rep present during session to aid in Fugyl-Meyer assessment and answer pt questions regarding process.    PERFORMANCE DEFICITS: in functional skills including ADLs, IADLs, coordination, dexterity, tone, ROM, strength, pain, flexibility, Fine motor control, Gross motor control, mobility, balance, body mechanics, endurance, decreased knowledge of precautions, decreased knowledge of use of DME, and UE functional use and psychosocial skills including environmental adaptation and routines and behaviors.   IMPAIRMENTS: are limiting patient from ADLs and IADLs.   CO-MORBIDITIES: may have co-morbidities  that affects occupational performance. Patient  will benefit from skilled OT to address above impairments and improve overall function.  MODIFICATION OR ASSISTANCE TO COMPLETE EVALUATION: Min-Moderate modification of tasks or assist with assess necessary to complete an  evaluation.  OT OCCUPATIONAL PROFILE AND HISTORY: Detailed assessment: Review of records and additional review of physical, cognitive, psychosocial history related to current functional performance.  CLINICAL DECISION MAKING: Moderate - several treatment options, min-mod task modification necessary  REHAB POTENTIAL: Good  EVALUATION COMPLEXITY: Moderate    PLAN:  OT FREQUENCY: 2x/week  OT DURATION: 6 weeks  PLANNED INTERVENTIONS: self care/ADL training, therapeutic exercise, therapeutic activity, neuromuscular re-education, manual therapy, passive range of motion, functional mobility training, splinting, electrical stimulation, ultrasound, compression bandaging, moist heat, cryotherapy, patient/family education, psychosocial skills training, energy conservation, coping strategies training, and DME and/or AE instructions  RECOMMENDED OTHER SERVICES: NA  CONSULTED AND AGREED WITH PLAN OF CARE: Patient and family member/caregiver  PLAN FOR NEXT SESSION: Engage in shoulder flexion in sitting and standing to facilitate increased strength and endurance with ROM, continue with gross grasp tasks, further address wrist flexion/extension and ulnar/radial deviation, supination, and finger flexion and extension.     Rosalio Loud, OTR/L 07/23/2023, 4:02 PM  [facility] at [facility] [address] [place], [address] [place], [place] [address] Phone # [phone] Fax # [phone]

## 2023-07-25 ENCOUNTER — Ambulatory Visit: Payer: Medicare PPO

## 2023-07-25 DIAGNOSIS — R293 Abnormal posture: Secondary | ICD-10-CM | POA: Diagnosis not present

## 2023-07-25 DIAGNOSIS — R2681 Unsteadiness on feet: Secondary | ICD-10-CM | POA: Diagnosis not present

## 2023-07-25 DIAGNOSIS — M6281 Muscle weakness (generalized): Secondary | ICD-10-CM | POA: Diagnosis not present

## 2023-07-25 DIAGNOSIS — R2689 Other abnormalities of gait and mobility: Secondary | ICD-10-CM

## 2023-07-25 DIAGNOSIS — I69351 Hemiplegia and hemiparesis following cerebral infarction affecting right dominant side: Secondary | ICD-10-CM | POA: Diagnosis not present

## 2023-07-25 NOTE — Therapy (Signed)
OUTPATIENT PHYSICAL THERAPY TREATMENT NOTE   Patient Name: Angela Buchanan MRN: [id number] DOB:December 27, 1950, 72 y.o., female Today's Date: 07/25/2023  PCP:  Garth Bigness, MD   REFERRING PROVIDER:  Garth Bigness, MD       END OF SESSION:   PT End of Session - 07/25/23 1449     Visit Number 46    Date for PT Re-Evaluation 09/28/23    Authorization Type [employer]    Authorization Time Period 10 visits 10/21-12/27    Authorization - Visit Number 1    Authorization - Number of Visits 10    Progress Note Due on Visit 50    PT Start Time 1402    PT Stop Time 1448    PT Time Calculation (min) 46 min    Activity Tolerance Patient tolerated treatment well    Behavior During Therapy [facility] for tasks assessed/performed                          Past Medical History:  Diagnosis Date   Atypical chest pain 07/19/2021   Bigeminy    no current med.   Breast cancer (HCC) 06/2017   right   Bruises easily    CAD in native artery 10/31/2022   Carotid bruit 11/13/2019   Dental crowns present    History of diverticulitis    Hypertension    states under control with med., has been on med. x 5 yr.   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   Past Surgical History:  Procedure Laterality Date   ABDOMINAL HYSTERECTOMY     partial   BREAST LUMPECTOMY Right 06/22/2017   Malignant   BREAST LUMPECTOMY WITH RADIOACTIVE SEED AND SENTINEL LYMPH NODE BIOPSY Right 06/22/2017   Procedure: RIGHT BREAST LUMPECTOMY WITH RADIOACTIVE SEED AND RIGHT AXILLARY DEEP SENTINEL LYMPH NODE BIOPSY WITH BLUE DYE INJECTION;  Surgeon: Claud Kelp, MD;  Location: [facility];  Service: General;  Laterality: Right;    BREAST LUMPECTOMY WITH RADIOACTIVE SEED LOCALIZATION Left 06/22/2017   Procedure: LEFT BREAST LUMPECTOMY WITH RADIOACTIVE SEED LOCALIZATION;  Surgeon: Claud Kelp, MD;  Location: [facility];  Service: General;  Laterality: Left;   CATARACT EXTRACTION W/ INTRAOCULAR LENS  IMPLANT, BILATERAL Bilateral    EXCISION OF BREAST BIOPSY Left 06/22/2017   benign   LOOP RECORDER INSERTION N/A 05/16/2022   Procedure: LOOP RECORDER INSERTION;  Surgeon: Lanier Prude, MD;  Location: [facility];  Service: Cardiovascular;  Laterality: N/A;   PERCUTANEOUS PINNING Right 06/22/2022   Procedure: PERCUTANEOUS PINNING OF RIGHT HIP;  Surgeon: Bjorn Pippin, MD;  Location: [facility];  Service: Orthopedics;  Laterality: Right;   TONSILLECTOMY     age 13   Patient Active Problem List   Diagnosis Date Noted   Right spastic hemiplegia (HCC) 12/14/2022   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   CAD in native artery 10/31/2022   Malnutrition of moderate degree 06/22/2022   Closed fracture of femur, neck (HCC) 06/21/2022   Urinary frequency 06/21/2022   Hypokalemia 06/21/2022   Heart murmur 06/21/2022   Hypothyroidism 06/21/2022   Dysphagia 05/17/2022   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   CVA (cerebral vascular accident) (HCC) 05/12/2022   Osteoporosis  01/24/2022   Atypical chest pain 07/19/2021   Temporomandibular joint (TMJ) pain 07/16/2020   Referred otalgia of both ears 07/16/2020   PAC (premature atrial contraction) 05/11/2020   PVC (premature ventricular contraction) 05/11/2020   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   Carotid bruit 11/13/2019   Pure hypercholesterolemia 11/13/2019   Malignant neoplasm of upper-outer quadrant of right breast in female, estrogen receptor positive (HCC) 05/22/2017   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   Diplopia 09/17/2014   Dizziness and giddiness 09/17/2014   Essential hypertension 07/22/2014   Chest pain 07/22/2014   Family  history of heart disease 07/22/2014    REFERRING DIAG:  R29.6 (ICD-10-CM) - Repeated falls, closed fracture of Rt hip    THERAPY DIAG:  Muscle weakness (generalized)  Other abnormalities of gait and mobility  Abnormal posture  Rationale for Evaluation and Treatment Rehabilitation  PERTINENT HISTORY: hypertension, breast cancer, hyperlipidemia, Lt hip fracture secondary to fall 06/2022, CVA 06/2022   PRECAUTIONS:  Fall, Rt breast cancer with node removal, osteoporosis    SUBJECTIVE:                                                                                                                                                                                      SUBJECTIVE STATEMENT:  I was assessed for Vivistim System for my Rt arm.  I will be referred to the doctor to do the next step.   Pt spouse present during visit.   PAIN:  PAIN:  Are you having pain? No  OBJECTIVE: (objective measures completed at initial evaluation unless otherwise dated)  DIAGNOSTIC FINDINGS: Rt hip fracture and ORIF    COGNITION: Overall cognitive status: Within functional limits for tasks assessed                         SENSATION: WFL  POSTURE: rounded shoulders, forward head, flexed trunk , and weight shift left   PALPATION: NA   LOWER EXTREMITY ROM: Rt hip limited by 50%, hamstrings limited by 50% bil.    LOWER EXTREMITY MMT:   MMT Right eval Right  09/27/22 Left eval Right 10/30/22 Right  01/17/23 Right 05/23/23 Left 10/30/22 Left 01/17/23  Hip flexion 3+ 4- 4- 4- 4 4 4- 4+  Hip extension            Hip abduction 3-  4     4  Hip adduction            Hip internal rotation            Hip external rotation            Knee flexion  3- 4- 3+  4 4  4+  Knee extension 3+ 4 4- 4 painful hip 4+ 4+ 4- 4+  Ankle dorsiflexion 3+ 4- 4- 4-   4   Ankle plantarflexion            Ankle inversion            Ankle eversion             (Blank rows = not tested)   FUNCTIONAL TESTS:   Eval:  Timed up and go (TUG): 1 min, 27 seconds   09/27/22: 5x sit to stand: 29 seconds with use of Lt hand  TUG: 27 seconds   10/30/22: 5x sit to stand: 23 sec use of LT hand     TUG: 26 sec with standard cane   11/20/22: 5x sit to stand: 18.68 seconds    TUG: 19.45 without device    3 minute walk test: 225 feet with a standard cane   01/10/23: 5x sit to stand: 15.77 seconds   01/17/23: TUG with cane: 15.41 seconds with cane    3 min walk test: 313 feet with cane    03/07/23: 3 min walk 275 feet with single point cane   TUG: 16 seconds   03/14/23: 6 min walk test: 723 feet with standard cane    TUG: 14.16 without cane    5x sit to stand: 12.15 seconds without hand   03/28/23 TUG 11 seconds without cane  5x STS 10.6 seconds no Ues  04/18/2023: 6 min walk test:  532 ft with [facility]  05/09/23: 6 min walk test with SPC: 667 feet - RPE 5  05/23/23: TUG: 11 seconds without cane 5x sit to stand: 12.71 seconds  6 min walk test: 763 feet  06/06/23:  5x sit to stand: 11.98 seconds    07/11/23:  5x sit to stand: 11.47 seconds   Short Physical Performance Battery:    Balance:       Side by side stance:    points (1)-1  Semi -tandem:     points (1)-1  Tandem:      points (2)-2    Gait Speed: (65m=9.84 feet) done 2x take the best time:      points                                           6.31 seconds-2 pts     Repeated Chair Stands:      points   (timer stopped when straight on 5th stand)                                             9.69 seconds- 4 pts  Total score=  10/12  points <10/12 predictive of 1 or more mobility limitations and increased risk of mobility disability 6 or less/12 associated with a higher fall rate         07/18/23: 6 min walk test-674 feet  TODAY'S TREATMENT:   DATE:  07/25/23 Nustep level 3 x10 minutes with LE only. >55 spm PT present throughout to discuss status Farmers carry 6# Lt UE from gym  mat table to mirror 2 laps Alternating step tap on 6" step 2x10 with CGA Sit to stand with 6# weight: 2x10- tried 7# but too heavy Leg Press (seat at 5) 55# 2x10, single leg: Lt 35#, Rt 15# 2x10 bil each Step to over hurdles with Lt UE support on barre: Lt lead and Rt lead with verbal cues for step height, sidestepping at barre -verbal cues to clear Rt LE   DATE:  07/18/23 Nustep level 3 x10 minutes with LE only. >55 spm PT present throughout to discuss status Farmers carry 6# Lt UE from gym mat table to mirror 2 laps Alternating step tap on 6" step 2x10 with CGA Sit to stand with 6# weight: 2x10 Leg Press (seat at 5) 55# 2x10, single leg: Lt 35#, Rt 15# 2x10 bil each Step to over hurdles with hand held assist on the Lt: Lt lead and Rt lead with verbal cues for step height 6 min walk test: 674 feet   DATE:  07/11/23 Nustep level 3 x10 minutes with LE only. >55 spm PT present throughout to discuss status Farmers carry 6# Lt UE from gym mat table to mirror 2 laps Alternating step tap on 6" step 2x10 with CGA Leg Press (seat at 5) 55# 2x10, single leg: Lt 35#, Rt 15# 2x10 bil each Step to over hurdles with hand held assist on the Lt: Lt lead and Rt lead with verbal cues for step height  PATIENT EDUCATION:  Education details: Access Code: [email or web address] Person educated: Patient Education method: Programmer, multimedia, Facilities manager, and Handouts Education comprehension: verbalized understanding and returned demonstration   HOME EXERCISE PROGRAM: Access Code: [email or web address] URL: [email or web address] Date: 09/27/2022 Prepared by: Tresa Endo  Exercises - Seated Long Arc Quad  - 3 x daily - 7 x weekly - 2 sets - 10 reps - 5 hold - Seated March   - 3 x daily - 7 x weekly - 3 sets - 10 reps - Seated Heel Toe Raises   - 3 x daily - 7 x weekly - 2 sets - 10 reps - Seated Isometric Hip Adduction with Ball  - 3 x daily - 7 x weekly - 2 sets - 10 reps - Sit to Stand with Armchair  - 2 x daily - 7 x  weekly - 2 sets - 5-10 reps - Standing Hip Abduction with Counter Support  - 1 x daily - 7 x weekly - 2 sets - 10 reps - Heel Raises with Counter Support  - 2 x daily - 7 x weekly - 2 sets - 10 reps  Patient Education - Walking with a Single [employer] - Modified 4 Point Gait Pattern  ASSESSMENT:   CLINICAL IMPRESSION:     Pt continues to make steady, yet slow gains with PT.  Today's treatment session focused on general strengthening and balance.  She continues to be more independent with exercises in the clinic and requires closer supervision with higher level tasks.  She reports that she is walking more in the community, up to 30-45 min with her dog or in a store.  Her gait speed is slow and Rt LE weakness secondary to CVA puts her at falls risk due to difficulty clearing the Rt foot when she is fatigued. Pt with increased Rt LE today so was challenged with standing activity.  Pt will benefit from skilled therapy to work on strength and mobility to reduce falls risk and pt remains challenged by current level of activity in the clinic.   Patient tolerated treatment session well.  Patient will benefit from skilled PT to address the below impairments and improve overall function.    OBJECTIVE IMPAIRMENTS: Abnormal gait, decreased activity tolerance, decreased balance, decreased mobility, difficulty walking, decreased strength, decreased safety awareness, impaired perceived functional ability, impaired flexibility, postural dysfunction, and pain.    ACTIVITY LIMITATIONS: carrying, lifting, sitting, standing, stairs, transfers, hygiene/grooming, and locomotion level   PARTICIPATION LIMITATIONS: meal prep, cleaning, laundry, driving, shopping, and community activity   PERSONAL FACTORS: Age, Past/current experiences, and 1-2 comorbidities: CVA, falls, Rt hip fracture with ORIF  are also affecting patient's functional outcome.    REHAB POTENTIAL: Good   CLINICAL DECISION MAKING: Evolving/moderate  complexity   EVALUATION COMPLEXITY: Moderate     GOALS: Goals reviewed with patient? Yes   SHORT TERM GOALS: Target date: 09/06/2022   Be independent in initial HEP Baseline: Goal status: Goal met 08/14/22   2.  Improve LE strength to perform sit to stand with moderate Lt UE support Baseline:  Goal status: Goal met 08/28/22  3.  Perform TUG in < or = to 60 seconds to reduce falls risk Baseline: 27 seconds (09/27/22) Goal status: MET      LONG TERM GOALS: Target date:  09/28/23   Be independent in advanced HEP Baseline: independent in current HEP and has tolerated progress when appropriate (07/18/23)  Goal status: in progress    2.  Perform TUG in < or = to 13 seconds to reduce falls risk Baseline: 11 seconds without cane (05/23/23) Goal status: met   3.  ambulate > or = to 750 feet in 6 minutes to improve community ambulation and independence  Baseline: this has fluctuated between 674-763 feet- not consistent ( 07/18/23) Goal status: revised     4.  perform 5x sit to stand in < or = to 12 seconds to reduce falls risk  Baseline: 11.47 seconds (07/11/23)  Goal Status: MET  5. Improve balance to walk her dog short distances with independence   Baseline: walking with dog now, slowly (07/18/23)  Goal status: MET      6. Improve gait speed to perform 51m walk in < or = to 3.6 seconds    Baseline: 6.31 seconds (07/18/23)    Goal Status: NEW   PLAN:   PT FREQUENCY: 1x/week   PT DURATION: 10 weeks   PLANNED INTERVENTIONS: Therapeutic exercises, Therapeutic activity, Neuromuscular re-education, Balance training, Gait training, Patient/Family education, Self Care, Joint mobilization, Stair training, Dry Needling, Electrical stimulation, Cryotherapy, Moist heat, Taping, Manual therapy, and Re-evaluation   PLAN FOR NEXT SESSION: Continue to work on gait , strength and balance. Endurance, change of direction.    Lorrene Reid, PT 07/25/23 2:49 PM   [facility] [address], [address] [place], [place] [address] Phone # [phone] Fax [phone]

## 2023-07-26 ENCOUNTER — Ambulatory Visit: Payer: Medicare PPO | Admitting: Occupational Therapy

## 2023-07-30 ENCOUNTER — Ambulatory Visit: Payer: Medicare PPO

## 2023-07-30 DIAGNOSIS — R2681 Unsteadiness on feet: Secondary | ICD-10-CM | POA: Diagnosis not present

## 2023-07-30 DIAGNOSIS — I69351 Hemiplegia and hemiparesis following cerebral infarction affecting right dominant side: Secondary | ICD-10-CM | POA: Diagnosis not present

## 2023-07-30 DIAGNOSIS — R293 Abnormal posture: Secondary | ICD-10-CM

## 2023-07-30 DIAGNOSIS — R2689 Other abnormalities of gait and mobility: Secondary | ICD-10-CM | POA: Diagnosis not present

## 2023-07-30 DIAGNOSIS — M6281 Muscle weakness (generalized): Secondary | ICD-10-CM

## 2023-07-30 NOTE — Therapy (Signed)
OUTPATIENT PHYSICAL THERAPY TREATMENT NOTE   Patient Name: Angela Buchanan MRN: [id number] DOB:September 02, 1951, 72 y.o., female Today's Date: 07/30/2023  PCP:  Garth Bigness, MD   REFERRING PROVIDER:  Garth Bigness, MD       END OF SESSION:   PT End of Session - 07/30/23 1447     Visit Number 47    Date for PT Re-Evaluation 09/28/23    Authorization Type [employer]    Authorization Time Period 10 visits 10/21-12/27    Authorization - Visit Number 2    Authorization - Number of Visits 10    Progress Note Due on Visit 50    PT Start Time 1401    PT Stop Time 1447    PT Time Calculation (min) 46 min    Equipment Utilized During Treatment Gait belt    Activity Tolerance Patient tolerated treatment well    Behavior During Therapy WFL for tasks assessed/performed                           Past Medical History:  Diagnosis Date   Atypical chest pain 07/19/2021   Bigeminy    no current med.   Breast cancer (HCC) 06/2017   right   Bruises easily    CAD in native artery 10/31/2022   Carotid bruit 11/13/2019   Dental crowns present    History of diverticulitis    Hypertension    states under control with med., has been on med. x 5 yr.   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   Past Surgical History:  Procedure Laterality Date   ABDOMINAL HYSTERECTOMY     partial   BREAST LUMPECTOMY Right 06/22/2017   Malignant   BREAST LUMPECTOMY WITH RADIOACTIVE SEED AND SENTINEL LYMPH NODE BIOPSY Right 06/22/2017   Procedure: RIGHT BREAST LUMPECTOMY WITH RADIOACTIVE SEED AND RIGHT AXILLARY DEEP SENTINEL LYMPH NODE BIOPSY WITH BLUE DYE INJECTION;  Surgeon: Claud Kelp, MD;  Location: [facility];  Service: General;  Laterality: Right;   BREAST LUMPECTOMY WITH RADIOACTIVE SEED LOCALIZATION Left 06/22/2017   Procedure: LEFT BREAST LUMPECTOMY WITH RADIOACTIVE SEED LOCALIZATION;  Surgeon: Claud Kelp, MD;  Location: [facility];  Service: General;  Laterality: Left;   CATARACT EXTRACTION W/ INTRAOCULAR LENS  IMPLANT, BILATERAL Bilateral    EXCISION OF BREAST BIOPSY Left 06/22/2017   benign   LOOP RECORDER INSERTION N/A 05/16/2022   Procedure: LOOP RECORDER INSERTION;  Surgeon: Lanier Prude, MD;  Location: [facility];  Service: Cardiovascular;  Laterality: N/A;   PERCUTANEOUS PINNING Right 06/22/2022   Procedure: PERCUTANEOUS PINNING OF RIGHT HIP;  Surgeon: Bjorn Pippin, MD;  Location: [facility];  Service: Orthopedics;  Laterality: Right;   TONSILLECTOMY     age 31   Patient Active Problem List   Diagnosis Date Noted   Right spastic hemiplegia (HCC) 12/14/2022   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   CAD in native artery 10/31/2022   Malnutrition of moderate degree 06/22/2022   Closed fracture of femur, neck (HCC) 06/21/2022   Urinary frequency 06/21/2022   Hypokalemia 06/21/2022   Heart murmur 06/21/2022   Hypothyroidism 06/21/2022   Dysphagia 05/17/2022   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  CVA (cerebral vascular accident) (HCC) 05/12/2022   Osteoporosis 01/24/2022   Atypical chest pain 07/19/2021   Temporomandibular joint (TMJ) pain 07/16/2020   Referred otalgia of both ears 07/16/2020   PAC (premature atrial contraction) 05/11/2020   PVC (premature ventricular contraction) 05/11/2020   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   Carotid bruit 11/13/2019   Pure hypercholesterolemia 11/13/2019   Malignant neoplasm of upper-outer quadrant of right breast in female, estrogen receptor positive (HCC) 05/22/2017   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   Diplopia 09/17/2014   Dizziness and giddiness 09/17/2014   Essential  hypertension 07/22/2014   Chest pain 07/22/2014   Family history of heart disease 07/22/2014    REFERRING DIAG:  R29.6 (ICD-10-CM) - Repeated falls, closed fracture of Rt hip    THERAPY DIAG:  Muscle weakness (generalized)  Other abnormalities of gait and mobility  Abnormal posture  Rationale for Evaluation and Treatment Rehabilitation  PERTINENT HISTORY: hypertension, breast cancer, hyperlipidemia, Lt hip fracture secondary to fall 06/2022, CVA 06/2022   PRECAUTIONS:  Fall, Rt breast cancer with node removal, osteoporosis    SUBJECTIVE:                                                                                                                                                                                      SUBJECTIVE STATEMENT:  I am doing well.  I I walk slowly with the dog for about 45 minutes and I don't sit down during that time.   Pt spouse present during visit.   PAIN:  PAIN:  Are you having pain? No  OBJECTIVE: (objective measures completed at initial evaluation unless otherwise dated)  DIAGNOSTIC FINDINGS: Rt hip fracture and ORIF    COGNITION: Overall cognitive status: Within functional limits for tasks assessed                         SENSATION: WFL  POSTURE: rounded shoulders, forward head, flexed trunk , and weight shift left   PALPATION: NA   LOWER EXTREMITY ROM: Rt hip limited by 50%, hamstrings limited by 50% bil.    LOWER EXTREMITY MMT:   MMT Right eval Right  09/27/22 Left eval Right 10/30/22 Right  01/17/23 Right 05/23/23 Left 10/30/22 Left 01/17/23  Hip flexion 3+ 4- 4- 4- 4 4 4- 4+  Hip extension            Hip abduction 3-  4     4  Hip adduction            Hip internal rotation            Hip external rotation  Knee flexion 3- 4- 3+  4 4  4+  Knee extension 3+ 4 4- 4 painful hip 4+ 4+ 4- 4+  Ankle dorsiflexion 3+ 4- 4- 4-   4   Ankle plantarflexion            Ankle inversion            Ankle eversion              (Blank rows = not tested)   FUNCTIONAL TESTS:   Eval: Timed up and go (TUG): 1 min, 27 seconds   09/27/22: 5x sit to stand: 29 seconds with use of Lt hand  TUG: 27 seconds   10/30/22: 5x sit to stand: 23 sec use of LT hand     TUG: 26 sec with standard cane   11/20/22: 5x sit to stand: 18.68 seconds    TUG: 19.45 without device    3 minute walk test: 225 feet with a standard cane   01/10/23: 5x sit to stand: 15.77 seconds   01/17/23: TUG with cane: 15.41 seconds with cane    3 min walk test: 313 feet with cane    03/07/23: 3 min walk 275 feet with single point cane   TUG: 16 seconds   03/14/23: 6 min walk test: 723 feet with standard cane    TUG: 14.16 without cane    5x sit to stand: 12.15 seconds without hand   03/28/23 TUG 11 seconds without cane  5x STS 10.6 seconds no Ues  04/18/2023: 6 min walk test:  532 ft with [facility]  05/09/23: 6 min walk test with SPC: 667 feet - RPE 5  05/23/23: TUG: 11 seconds without cane 5x sit to stand: 12.71 seconds  6 min walk test: 763 feet  06/06/23:  5x sit to stand: 11.98 seconds    07/11/23:  5x sit to stand: 11.47 seconds   Short Physical Performance Battery:    Balance:       Side by side stance:    points (1)-1  Semi -tandem:     points (1)-1  Tandem:      points (2)-2    Gait Speed: (51m=9.84 feet) done 2x take the best time:      points                                           6.31 seconds-2 pts     Repeated Chair Stands:      points   (timer stopped when straight on 5th stand)                                             9.69 seconds- 4 pts  Total score=  10/12  points <10/12 predictive of 1 or more mobility limitations and increased risk of mobility disability 6 or less/12 associated with a higher fall rate         07/18/23: 6 min walk test-674 feet  TODAY'S TREATMENT:   DATE:  07/30/23 Nustep level 3 x10 minutes with LE only >55 spm PT present  throughout to discuss status Farmers carry 6# Lt UE from gym mat table to mirror 2 laps step tap on 6" step 2x10 Rt only to work on clearance Sit to stand with 6# weight: 2x10  Leg Press (seat at 5) 55# 2x10, single leg: Lt 35#, Rt 15# 2x10 bil each Step to over hurdles with Lt UE support on barre: Lt lead and Rt lead with verbal cues for step height, sidestepping at barre -verbal cues to clear Rt LE  DATE:  07/25/23 Nustep level 3 x10 minutes with LE only. >55 spm PT present throughout to discuss status Farmers carry 6# Lt UE from gym mat table to mirror 2 laps Alternating step tap on 6" step 2x10 with CGA Sit to stand with 6# weight: 2x10- tried 7# but too heavy Leg Press (seat at 5) 55# 2x10, single leg: Lt 35#, Rt 15# 2x10 bil each Step to over hurdles with Lt UE support on barre: Lt lead and Rt lead with verbal cues for step height, sidestepping at barre -verbal cues to clear Rt LE   DATE:  07/18/23 Nustep level 3 x10 minutes with LE only. >55 spm PT present throughout to discuss status Farmers carry 6# Lt UE from gym mat table to mirror 2 laps Alternating step tap on 6" step 2x10 with CGA Sit to stand with 6# weight: 2x10 Leg Press (seat at 5) 55# 2x10, single leg: Lt 35#, Rt 15# 2x10 bil each Step to over hurdles with hand held assist on the Lt: Lt lead and Rt lead with verbal cues for step height 6 min walk test: 674 feet   PATIENT EDUCATION:  Education details: Access Code: [email or web address] Person educated: Patient Education method: Programmer, multimedia, Demonstration, and Handouts Education comprehension: verbalized understanding and returned demonstration   HOME EXERCISE PROGRAM: Access Code: [email or web address] URL: [email or web address] Date: 09/27/2022 Prepared by: Tresa Endo  Exercises - Seated Long Arc Quad  - 3 x daily - 7 x weekly - 2 sets - 10 reps - 5 hold - Seated March   - 3 x daily - 7 x weekly - 3 sets - 10 reps - Seated Heel Toe Raises   - 3 x daily - 7 x weekly - 2 sets  - 10 reps - Seated Isometric Hip Adduction with Ball  - 3 x daily - 7 x weekly - 2 sets - 10 reps - Sit to Stand with Armchair  - 2 x daily - 7 x weekly - 2 sets - 5-10 reps - Standing Hip Abduction with Counter Support  - 1 x daily - 7 x weekly - 2 sets - 10 reps - Heel Raises with Counter Support  - 2 x daily - 7 x weekly - 2 sets - 10 reps  Patient Education - Walking with a Single [employer] - Modified 4 Point Gait Pattern  ASSESSMENT:   CLINICAL IMPRESSION:     Pt has been walking regularly with her dog for about 45 minutes.  She continues to be more independent with exercises in the clinic and requires closer supervision with higher level tasks.  Her gait speed is slow and Rt LE weakness secondary to CVA puts her at falls risk due to difficulty clearing the Rt foot when she is fatigued. Pt has difficulty clearing her Rt LE over hurdle when leading with the Lt.   Patient tolerated treatment session well.  Patient  will benefit from skilled PT to address the below impairments and improve overall function.    OBJECTIVE IMPAIRMENTS: Abnormal gait, decreased activity tolerance, decreased balance, decreased mobility, difficulty walking, decreased strength, decreased safety awareness, impaired perceived functional ability, impaired flexibility, postural dysfunction, and pain.    ACTIVITY LIMITATIONS: carrying, lifting, sitting, standing, stairs, transfers, hygiene/grooming, and locomotion level   PARTICIPATION LIMITATIONS: meal prep, cleaning, laundry, driving, shopping, and community activity   PERSONAL FACTORS: Age, Past/current experiences, and 1-2 comorbidities: CVA, falls, Rt hip fracture with ORIF  are also affecting patient's functional outcome.    REHAB POTENTIAL: Good   CLINICAL DECISION MAKING: Evolving/moderate complexity   EVALUATION COMPLEXITY: Moderate     GOALS: Goals reviewed with patient? Yes   SHORT TERM GOALS: Target date: 09/06/2022   Be independent in initial  HEP Baseline: Goal status: Goal met 08/14/22   2.  Improve LE strength to perform sit to stand with moderate Lt UE support Baseline:  Goal status: Goal met 08/28/22  3.  Perform TUG in < or = to 60 seconds to reduce falls risk Baseline: 27 seconds (09/27/22) Goal status: MET      LONG TERM GOALS: Target date:  09/28/23   Be independent in advanced HEP Baseline: independent in current HEP and has tolerated progress when appropriate (07/18/23)  Goal status: in progress    2.  Perform TUG in < or = to 13 seconds to reduce falls risk Baseline: 11 seconds without cane (05/23/23) Goal status: met   3.  ambulate > or = to 750 feet in 6 minutes to improve community ambulation and independence  Baseline: this has fluctuated between 674-763 feet- not consistent ( 07/18/23) Goal status: revised     4.  perform 5x sit to stand in < or = to 12 seconds to reduce falls risk  Baseline: 11.47 seconds (07/11/23)  Goal Status: MET  5. Improve balance to walk her dog short distances with independence   Baseline: walking with dog now, slowly (07/18/23)  Goal status: MET      6. Improve gait speed to perform 72m walk in < or = to 3.6 seconds    Baseline: 6.31 seconds (07/18/23)    Goal Status: NEW   PLAN:   PT FREQUENCY: 1x/week   PT DURATION: 10 weeks   PLANNED INTERVENTIONS: Therapeutic exercises, Therapeutic activity, Neuromuscular re-education, Balance training, Gait training, Patient/Family education, Self Care, Joint mobilization, Stair training, Dry Needling, Electrical stimulation, Cryotherapy, Moist heat, Taping, Manual therapy, and Re-evaluation   PLAN FOR NEXT SESSION: Continue to work on gait , strength and balance. Endurance, change of direction.    Lorrene Reid, PT 07/30/23 2:48 PM   [facility] [address], [address] [place], [place] [address] Phone # [phone] Fax [phone]

## 2023-07-31 ENCOUNTER — Encounter: Payer: Medicare PPO | Admitting: Occupational Therapy

## 2023-07-31 ENCOUNTER — Ambulatory Visit: Payer: Medicare PPO | Admitting: Occupational Therapy

## 2023-07-31 DIAGNOSIS — R278 Other lack of coordination: Secondary | ICD-10-CM | POA: Diagnosis not present

## 2023-07-31 DIAGNOSIS — I69351 Hemiplegia and hemiparesis following cerebral infarction affecting right dominant side: Secondary | ICD-10-CM

## 2023-07-31 DIAGNOSIS — M79601 Pain in right arm: Secondary | ICD-10-CM | POA: Diagnosis not present

## 2023-07-31 DIAGNOSIS — M6281 Muscle weakness (generalized): Secondary | ICD-10-CM

## 2023-07-31 DIAGNOSIS — M25611 Stiffness of right shoulder, not elsewhere classified: Secondary | ICD-10-CM

## 2023-07-31 NOTE — Therapy (Signed)
OUTPATIENT OCCUPATIONAL THERAPY NEURO  Treatment Note & Re-cert  Patient Name: Angela Buchanan MRN: [id number] DOB:1951/05/12, 72 y.o., female Today's Date: 07/31/2023  PCP: Shon Hale, MD REFERRING PROVIDER: Shon Hale, MD    END OF SESSION:  OT End of Session - 07/31/23 1411     Visit Number 21    Number of Visits 27    Date for OT Re-Evaluation 09/07/23    Authorization Type [employer]    OT Start Time 1400    OT Stop Time 1440    OT Time Calculation (min) 40 min    Behavior During Therapy [facility] for tasks assessed/performed                                 Past Medical History:  Diagnosis Date   Atypical chest pain 07/19/2021   Bigeminy    no current med.   Breast cancer (HCC) 06/2017   right   Bruises easily    CAD in native artery 10/31/2022   Carotid bruit 11/13/2019   Dental crowns present    History of diverticulitis    Hypertension    states under control with med., has been on med. x 5 yr.   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   Past Surgical History:  Procedure Laterality Date   ABDOMINAL HYSTERECTOMY     partial   BREAST LUMPECTOMY Right 06/22/2017   Malignant   BREAST LUMPECTOMY WITH RADIOACTIVE SEED AND SENTINEL LYMPH NODE BIOPSY Right 06/22/2017   Procedure: RIGHT BREAST LUMPECTOMY WITH RADIOACTIVE SEED AND RIGHT AXILLARY DEEP SENTINEL LYMPH NODE BIOPSY WITH BLUE DYE INJECTION;  Surgeon: Claud Kelp, MD;  Location: [facility];  Service: General;  Laterality: Right;   BREAST LUMPECTOMY WITH RADIOACTIVE SEED LOCALIZATION Left 06/22/2017   Procedure: LEFT BREAST LUMPECTOMY WITH RADIOACTIVE SEED LOCALIZATION;  Surgeon: Claud Kelp,  MD;  Location: [facility];  Service: General;  Laterality: Left;   CATARACT EXTRACTION W/ INTRAOCULAR LENS  IMPLANT, BILATERAL Bilateral    EXCISION OF BREAST BIOPSY Left 06/22/2017   benign   LOOP RECORDER INSERTION N/A 05/16/2022   Procedure: LOOP RECORDER INSERTION;  Surgeon: Lanier Prude, MD;  Location: [facility];  Service: Cardiovascular;  Laterality: N/A;   PERCUTANEOUS PINNING Right 06/22/2022   Procedure: PERCUTANEOUS PINNING OF RIGHT HIP;  Surgeon: Bjorn Pippin, MD;  Location: [facility];  Service: Orthopedics;  Laterality: Right;   TONSILLECTOMY     age 59   Patient Active Problem List   Diagnosis Date Noted   Right spastic hemiplegia (HCC) 12/14/2022   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 05/11/2020   PVC (premature  ventricular contraction) 05/11/2020   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   Carotid bruit 11/13/2019   Pure hypercholesterolemia 11/13/2019   Malignant neoplasm of upper-outer quadrant of right breast in female, estrogen receptor positive (HCC) 05/22/2017   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   Diplopia 09/17/2014   Dizziness and giddiness 09/17/2014   Essential hypertension 07/22/2014   Chest pain 07/22/2014   Family history of heart disease 07/22/2014    ONSET DATE: 05/13/22 (new referral 04/09/23)  REFERRING DIAG: G83.23 (ICD-10-CM) - 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 Pt accompanied by: self and significant other ([name])  PERTINENT HISTORY: hypertension, breast cancer, hyperlipidemia, Lt hip fracture secondary to fall 06/2022, CVA 05/2022    PRECAUTIONS: Fall, Rt breast cancer with node removal, osteoporosis   WEIGHT BEARING RESTRICTIONS: No  PAIN:  Are you having pain? No  FALLS: Has patient fallen in last 6 months? No  LIVING ENVIRONMENT: Lives with: lives with their spouse Lives in: House/apartment Stairs: Yes: External: 2 steps; on left going up Has following equipment at home: Quad cane large base, Environmental consultant - 2 wheeled, Hemi walker, Wheelchair (manual), shower chair, and Grab bars  PLOF: Independent and Independent with basic ADLs  PATIENT GOALS: to have better use my arm and hand  OBJECTIVE:   HAND DOMINANCE: Right  ADLs:  Transfers/ambulation related to ADLs: Utilizes [facility] for mobility in the community, 80% in the house but will walk without occasionally Eating: husband cuts foods, utilizing built up handles for self-feeding Grooming: needs assistance with washing hair UB Dressing: needs assistance with fastening bra LB Dressing: Mod-max assist for donning pants due to fear of falling, able to don socks and slip on shoes - unable to tie shoes Toileting: Mod I Bathing: Supervision Tub Shower transfers: Supervision, but able to complete transfers with use of grab bars Equipment: Shower seat with back, Grab bars, and Walk in shower  IADLs: Handwriting:  TBA at next session  MOBILITY STATUS:  Needs Assist: utilized [facility] for mobility and Hx of falls  POSTURE COMMENTS:  rounded shoulders and forward head  ACTIVITY TOLERANCE: Activity tolerance: fatigues with increased mobility and effort  FUNCTIONAL OUTCOME MEASURES: Upper Extremity Functional Scale (UEFS): TBD  UPPER EXTREMITY ROM:    Active ROM Right eval Right 05/31/23 Right 06/14/23 Right 07/30/22  Shoulder flexion 87 93 98 94  Shoulder abduction 78 82 86 85  Shoulder adduction      Shoulder extension      Shoulder internal rotation 90% 100% 100%   Shoulder external rotation 50% 75% 95% (requires increased time)   Elbow flexion 132 137 138   Elbow extension -17 -12 -12   Wrist flexion 19 40 48   Wrist extension 35 35 40   Wrist ulnar deviation 15 10 15    Wrist radial deviation 13 15 15    Wrist pronation WNL  WNL   Wrist supination 20 from neutral 65* 65   (Blank rows = not tested)  UPPER EXTREMITY MMT:     MMT Right eval Left eval  Shoulder flexion    Shoulder abduction    Shoulder  adduction    Shoulder extension    Shoulder internal rotation    Shoulder external rotation    Middle trapezius    Lower trapezius    Elbow flexion    Elbow extension    Wrist flexion    Wrist extension    Wrist ulnar deviation    Wrist radial deviation    Wrist pronation    Wrist supination    (Blank rows = not tested)    Active ROM Extension (as flexion to loose gross grasp) Right eval Right 05/31/23 Right 06/14/23  Thumb MCP (0-60)     Thumb IP (0-80)     Thumb Radial abd/add (0-55)     Thumb Palmar abd/add (0-45)     Thumb opposition to index     Index MCP (0-90) 0  0  Index PIP (0-100) -30 -50 -22  Index DIP (0-70) -40 -35 -35  Long MCP (0-90) -5 0 0  Long PIP (0-100) -45 -35 -40  Long DIP (0-70) -5 -25 -15  Ring MCP (0-90_ 0 0   Ring PIP (0-100) -40 -40   Ring DIP (0-70) -20 -20   Little MCP (0-90) -7 0   Little PIP (0-100) -25 -25   Little DIP (0-70) -5 0   (Blank rows = not tested)     HAND FUNCTION: 07/31/23 Grip strength: Right: 6#  05/31/23 Grip strength: Right: 9#  Grip strength: Right: 7,9 = 8# lbs; Left: 30 lbs  COORDINATION: 07/31/23 Box and blocs: Right 15 blocks  05/31/23 9 Hole Peg test: Right: able to pick up one peg with thumb and long finger, however unable to rotate to place into peg board Box and Blocks:  Right 15 blocks  Finger Nose Finger test: slow and deliberate with RUE, but able to touch both nose and finger at this time 9 Hole Peg test: Right: unable to pick up any pegs with RUE  Box and Blocks:  Right 16 blocks, Left 55 blocks  SENSATION: Light touch: WFL Hot/Cold: WFL  EDEMA: no swelling on eval  MUSCLE TONE: RUE: Mild and Hypertonic  COGNITION: Overall cognitive status: Within functional limits for tasks assessed  VISION: Subjective report: pt reports wearing glasses more since CVA Baseline vision: Wears glasses all the time, double vision after cataract removal Visual history: cataracts  OBSERVATIONS: Pt attempting grasp and pinch activities, frequently not incorporating index finger on R with increased flexion away from task.   TODAY'S TREATMENT:                                                           07/31/23 Self-care: lengthy conversation with Viv[employer] rep regarding questions about process, outcomes, and pt concerns in regards to undergoing surgical procedure.  OT and Viv[employer] rep (also an OT) answering questions within scope and encouraging pt to f/u with Vivistim ambassador (previous PT who has undergone Vivistim implant and therapy). UE ROM: engaging in functional reach to pick up Jenga blocks from table top to faciliate shoulder abduction and grasp.  Pt then challenged to rotate items to place on 3x3 pattern.  Pt with increased difficulty with rotation of blocks clockwise. Bean bag toss: engaged in tossing bean bags in standing with focus on functional reach, grasp, and then release.  Pt continues to demonstrate  decreased index finger  extension during opening to prepare for grasp, but overall improvements in gross grasp.  Pt demonstrating good shoulder flexion and release of grasp to toss bean bags at target.   07/23/23  FUGL-MEYER ASSESSMENT   A. Upper Extremity  I. Reflex Activity: Flexors (elbow) 2  Extensors (triceps) 2  Total 4/4     II. Volitional Movement within Synergies: Flexor Synergy:   Shoulder Retraction 1  Shoulder Elevation 1  Shoulder Abduction 1  External Rotation 1  Elbow Flexion 2  Forearm Supination 1  Extensor Synergy:   Shoulder Adduction/Internal Rotation 1  Elbow Extension 2  Forearm Pronation 2  Total 12/18   III. Volitional Mixing of Synergies: Hand to Lumbar Spine 2  Shoulder Flexion 1  Pronation/Supination 1  Total 4/6   IV. Volitional Movement with Little to No Synergy Shoulder Abduction 0  Shoulder Flexion 90+ 0  Pronation/Supination 1  Total 1/6    B. Wrist Stability of Wrist at 15* 1  Repeated Flex/Ext 1  Circumduction 1  Stability at 15*Wrist, 0*Elbow 0  Flexion/Extension - Elbow 0* 1  Total 4/10   C. Hand Mass Flexion 1  Mass Extension 1  Total 2/4    D. Grasp Hook  2  Thumb Adduction - paper 2  Pincer - pen 0  Cylindrical - cup 0  Spherical - ball 0  Total 4/10   E. Coordination/Speed Tremor 1  Dysmetria 2  Time 0  Total 3/6    FUGL-MEYER ASSESSMENT: UPPER EXTREMITY A-E TOTAL: 34/66      07/16/23 UE ROM: Engaged in grasp and shoulder internal/external rotation with rainbow arc.  Pt initially grasping rings in internal rotation and pronation, therefore OT encouraging pt to pick up with arm in neutral position to facilitate increased supination then transitioning into pronation, as well as internal/external rotation.  Transitioned to reaching down to lower surface to picking up ring and then place on vertical target to facilitate elbow and shoulder extension and shoulder flexion.   Forearm ROM: engaged in forearm  supination/pronation with flipping over small rings, however pt utilizing shoulder hike and flexion therefore modified task.  Transitioned to supination/pronation with 1# dumbbell with focus on motor control with pronation and increased ROM with supination.     PATIENT EDUCATION: Education details: ongoing condition specific education. Person educated: Patient and Spouse Education method: Explanation, Demonstration, and Verbal cues Education comprehension: verbalized understanding and needs further education  HOME EXERCISE PROGRAM: Access Code: [email or web address] URL: [email or web address] Date: 04/23/2023 Prepared by: [facility] - Outpatient  Rehab - [facility]    GOALS: Goals reviewed with patient? Yes  SHORT TERM GOALS: Target date: 07/06/23  Pt and spouse will be independent with advanced HEP for ROM, strengthening, and coordination.  Baseline: Goal status: IN PROGRESS  2.  Pt will verbalize understanding of task modifications and/or potential AE needs to increase ease, safety, and independence w/ ADLs.  Baseline: Goal status: IN PROGRESS   NEW SHORT TERM GOALS: Target date: 07/06/23  Pt and spouse will be independent with advanced HEP for ROM, strengthening, and coordination to D/C at end of this certification period. Baseline: Goal status: IN PROGRESS  2.  Pt will verbalize understanding of additional treatment interventions (ie Vivistim) to facilitate increased functional return of dominant RUE. Baseline: Goal status: IN PROGRESS  LONG TERM GOALS: Target date: 07/27/23  Pt will demonstrate improved functional grasp in RUE by 5# to open new jar, increased handwriting, and self-feeding.  Baseline: R: 9# on 05/31/23 Goal status: Not  met - 6# on 08/01/23  2.  Pt will demonstrate improved UE functional use for ADLs as evidenced by increasing box/ blocks score by 4 blocks with RUE. Baseline: R: 18 blocks on 06/14/23 Goal status: Not met - 15 blocks on  08/01/23  3.  Pt will demonstrate improved shoulder flexion as needed for IADLs (to place a dish in the cabinet and/or laundry on a shelf at eye level) Baseline:  Goal status: Not met - 08/01/23  4.  Pt will demonstrate improvement of finger extension by by 10* TAM of index and 15* TAM of long ringer finger to allow for more functional hand opening.  Baseline: -57 IF on 06/14/23 , -55 LF Goal status: Not assessed due to time constraints and focus during 10/21 and 07/31/23  5.  Pt will demonstrate improved handwriting with ability to write 10 item simple grocery list with 90% legibility. Baseline:  Goal status: Not assessed due to change in focus in towards Vivistim appropriateness  6.  Pt will demonstrate improvements in functional use of dominant RUE as evidenced by improvements on Myrtis Ser and Allie Bossier Dickie La assessments. Baseline: TBD Goal status: MET - pt 4/8 on [place] and 5/6 on [place]   NEW LONG TERM GOALS: Target date: 09/07/23  Pt will demonstrate improved functional grasp in RUE by 5# to turn on/off water, open jar, and self-feeding.  Baseline: R: 9# on 05/31/23 (6# on 07/31/23) Goal status: REVISED  2.  Pt will demonstrate improved UE functional use for ADLs as evidenced by increasing box/ blocks score by 4 blocks with RUE. Baseline: R: 18 blocks on 06/14/23 (15 blocks on 07/17/23) Goal status: IN PROGRESS  3.  Pt will demonstrate improved shoulder flexion as needed for IADLs (to place a dish in the cabinet and/or laundry on a shelf at eye level) Baseline:  Goal status: IN PROGRESS  4.  Pt will demonstrate improvement of finger extension by by 10* TAM of index and 15* TAM of long ringer finger to allow for more functional hand opening.  Baseline: -57 IF on 06/14/23 , -55 LF Goal status: IN PROGRESS  5.  Pt will demonstrate improved handwriting with ability to write 10 item simple grocery list with 90% legibility. Baseline:  Goal status: IN  PROGRESS   ASSESSMENT:  CLINICAL IMPRESSION: During last few visits, focus of session have been on assessment for Vivistim and answering questions.  Pt has made little progress this last certification and this session, pt with lower measurements possibly due to fatigue or distraction.  Pt is reporting improvements in functional tasks at home and has demonstrated improved use of RUE during therapeutic tasks, however not shown objectively this session.  Pt will benefit from additional OT visits to f/u in regards to Vivistim recommendations and prepare pt and spouse for D/C with appropriate ROM, strengthening, and coordination HEP to facilitate transition to D/C at end of this upcoming cert period.    PERFORMANCE DEFICITS: in functional skills including ADLs, IADLs, coordination, dexterity, tone, ROM, strength, pain, flexibility, Fine motor control, Gross motor control, mobility, balance, body mechanics, endurance, decreased knowledge of precautions, decreased knowledge of use of DME, and UE functional use and psychosocial skills including environmental adaptation and routines and behaviors.   IMPAIRMENTS: are limiting patient from ADLs and IADLs.   CO-MORBIDITIES: may have co-morbidities  that affects occupational performance. Patient will benefit from skilled OT to address above impairments and improve overall function.  MODIFICATION OR ASSISTANCE TO COMPLETE EVALUATION: Min-Moderate modification of  tasks or assist with assess necessary to complete an evaluation.  OT OCCUPATIONAL PROFILE AND HISTORY: Detailed assessment: Review of records and additional review of physical, cognitive, psychosocial history related to current functional performance.  CLINICAL DECISION MAKING: Moderate - several treatment options, min-mod task modification necessary  REHAB POTENTIAL: Good  EVALUATION COMPLEXITY: Moderate    PLAN:  OT FREQUENCY: 2x/week  OT DURATION: 6 weeks  PLANNED INTERVENTIONS: self  care/ADL training, therapeutic exercise, therapeutic activity, neuromuscular re-education, manual therapy, passive range of motion, functional mobility training, splinting, electrical stimulation, ultrasound, compression bandaging, moist heat, cryotherapy, patient/family education, psychosocial skills training, energy conservation, coping strategies training, and DME and/or AE instructions  RECOMMENDED OTHER SERVICES: NA  CONSULTED AND AGREED WITH PLAN OF CARE: Patient and family member/caregiver  PLAN FOR NEXT SESSION: Engage in shoulder flexion in sitting and standing to facilitate increased strength and endurance with ROM, continue with gross grasp tasks, further address wrist flexion/extension and ulnar/radial deviation, supination, and finger flexion and extension.   Referring diagnosis? 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 (if different than referring diagnosis)  Hemiplegia and hemiparesis following cerebral infarction affecting right dominant side (HCC)  Other lack of coordination  Stiffness of right shoulder, not elsewhere classified  Muscle weakness (generalized) What was this (referring dx) caused by? []  Surgery []  Fall [x]  Ongoing issue []  Arthritis [x]  Other: _CVA___________  Laterality: [x]  Rt []  Lt []  Both  Check all possible CPT codes:  *CHOOSE 10 OR LESS*    See Planned Interventions listed in the Plan section of the Evaluation.    Rosalio Loud, OTR/L 07/31/2023, 2:18 PM  [facility] at [facility] [address] [place], [address] [place], [place] [address] Phone # [phone] Fax # [phone]

## 2023-08-01 DIAGNOSIS — R350 Frequency of micturition: Secondary | ICD-10-CM | POA: Diagnosis not present

## 2023-08-01 DIAGNOSIS — F411 Generalized anxiety disorder: Secondary | ICD-10-CM | POA: Diagnosis not present

## 2023-08-01 DIAGNOSIS — E785 Hyperlipidemia, unspecified: Secondary | ICD-10-CM | POA: Diagnosis not present

## 2023-08-01 DIAGNOSIS — I69351 Hemiplegia and hemiparesis following cerebral infarction affecting right dominant side: Secondary | ICD-10-CM | POA: Diagnosis not present

## 2023-08-01 DIAGNOSIS — E039 Hypothyroidism, unspecified: Secondary | ICD-10-CM | POA: Diagnosis not present

## 2023-08-01 DIAGNOSIS — I10 Essential (primary) hypertension: Secondary | ICD-10-CM | POA: Diagnosis not present

## 2023-08-02 ENCOUNTER — Encounter: Payer: Medicare PPO | Admitting: Occupational Therapy

## 2023-08-02 ENCOUNTER — Ambulatory Visit (INDEPENDENT_AMBULATORY_CARE_PROVIDER_SITE_OTHER): Payer: Medicare PPO

## 2023-08-02 DIAGNOSIS — I639 Cerebral infarction, unspecified: Secondary | ICD-10-CM

## 2023-08-02 LAB — CUP PACEART REMOTE DEVICE CHECK
Date Time Interrogation Session: 20241031002900
Implantable Pulse Generator Implant Date: 20230815
Pulse Gen Serial Number: 183424

## 2023-08-08 ENCOUNTER — Ambulatory Visit: Payer: Medicare PPO | Attending: Family Medicine

## 2023-08-08 DIAGNOSIS — M25611 Stiffness of right shoulder, not elsewhere classified: Secondary | ICD-10-CM | POA: Diagnosis not present

## 2023-08-08 DIAGNOSIS — I69351 Hemiplegia and hemiparesis following cerebral infarction affecting right dominant side: Secondary | ICD-10-CM | POA: Insufficient documentation

## 2023-08-08 DIAGNOSIS — R278 Other lack of coordination: Secondary | ICD-10-CM | POA: Insufficient documentation

## 2023-08-08 DIAGNOSIS — R2689 Other abnormalities of gait and mobility: Secondary | ICD-10-CM | POA: Diagnosis not present

## 2023-08-08 DIAGNOSIS — M6281 Muscle weakness (generalized): Secondary | ICD-10-CM | POA: Diagnosis not present

## 2023-08-08 DIAGNOSIS — R293 Abnormal posture: Secondary | ICD-10-CM | POA: Diagnosis not present

## 2023-08-08 IMAGING — MG DIGITAL DIAGNOSTIC BILAT W/ TOMO W/ CAD
9 series · 9 of 25 positions shown · non-contrast
Comparison: Previous exam(s).

CLINICAL DATA: History of RIGHT complex in 5309. History of benign
excisional biopsy LEFT breast.

EXAM:
DIGITAL DIAGNOSTIC BILATERAL MAMMOGRAM WITH TOMOSYNTHESIS AND CAD
TECHNIQUE: Bilateral digital diagnostic mammography and breast tomosynthesis
was performed. The images were evaluated with computer-aided
detection.

[R CC]
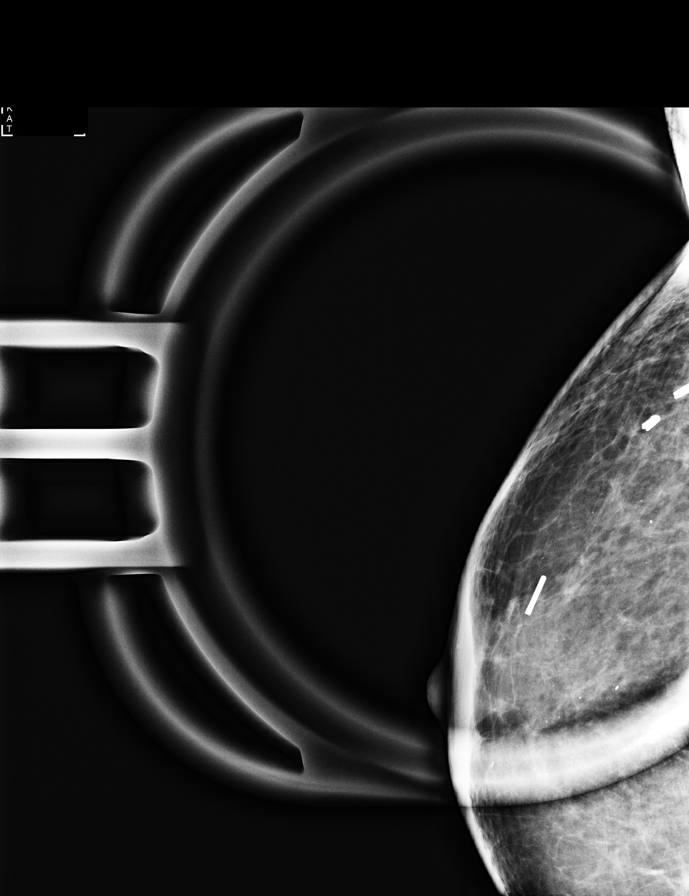

[R MLO synth-2D]
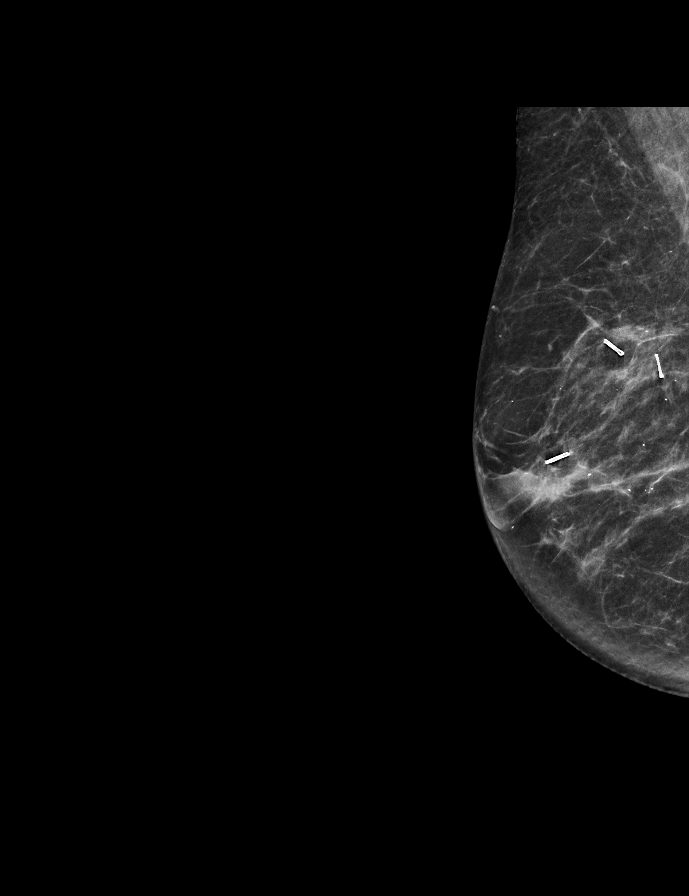

[L CC synth-2D]
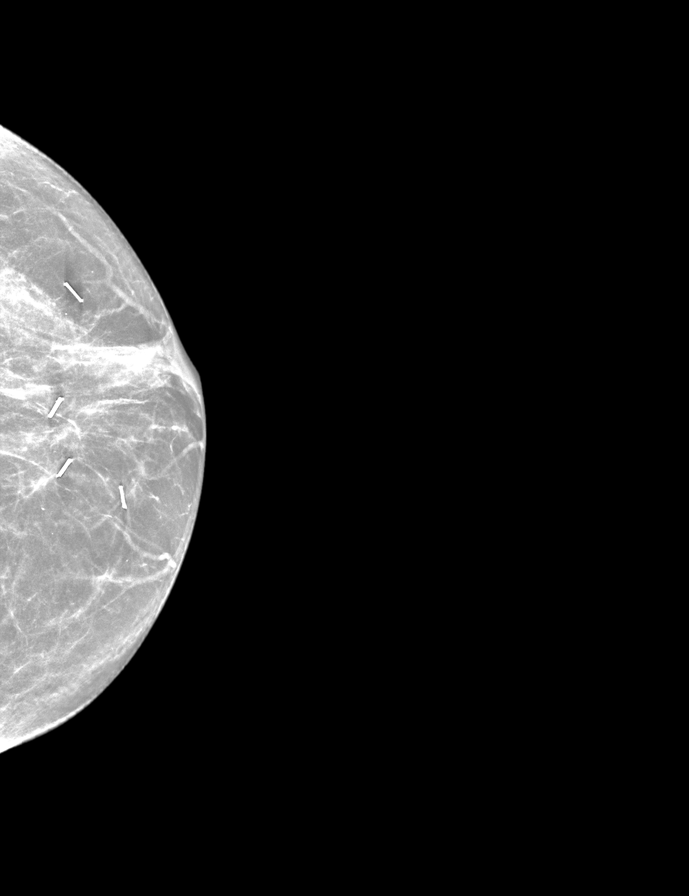

[R CC synth-2D]
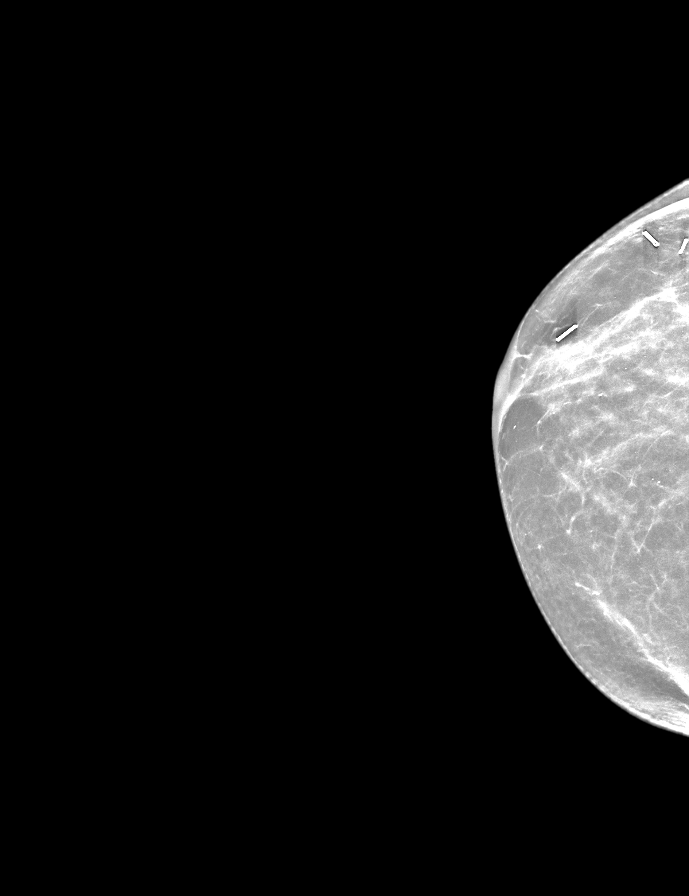

[L MLO synth-2D]
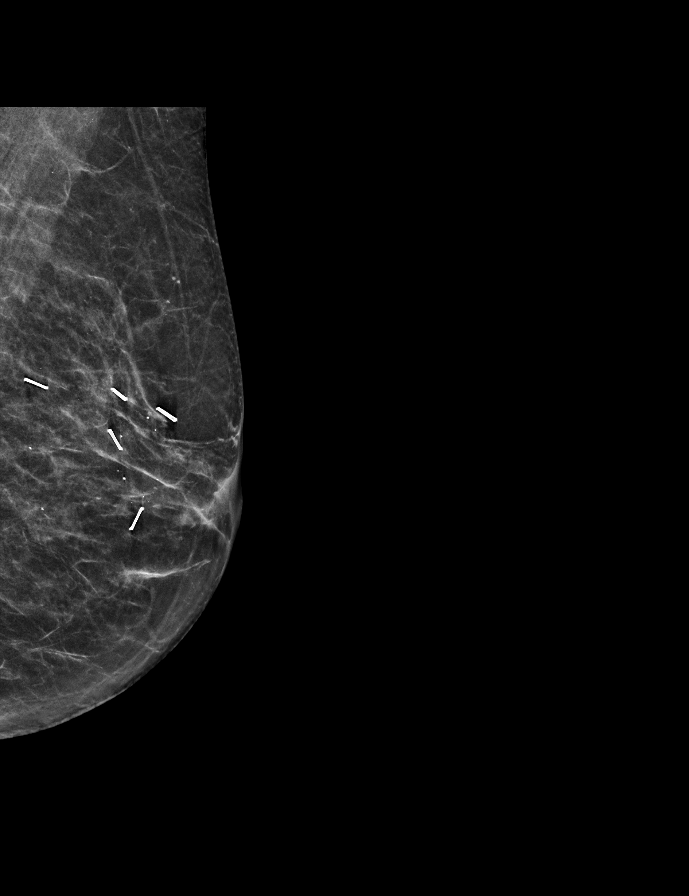

[L CC tomo · tomo slice 25/49.0]
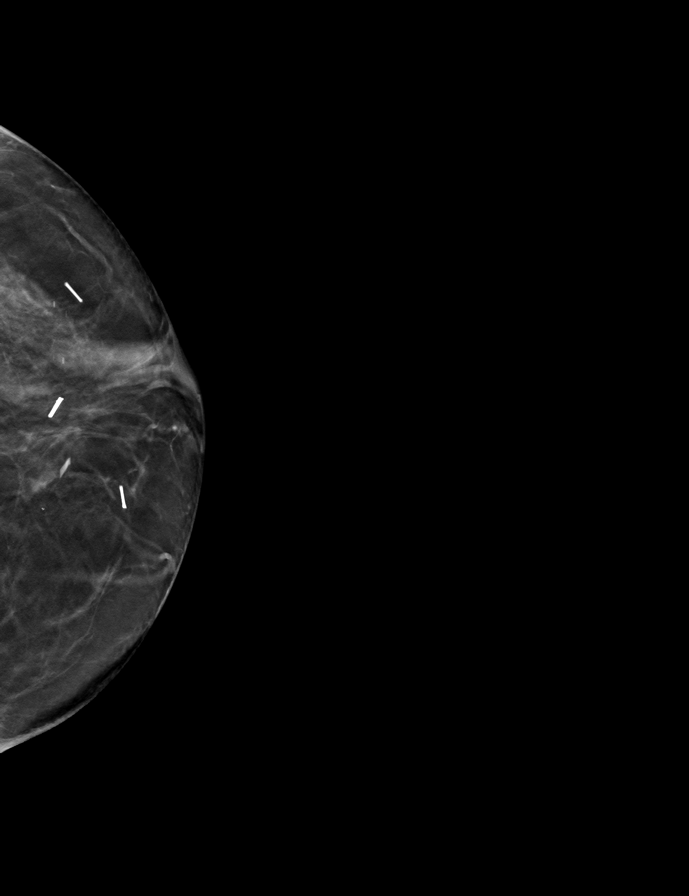

[L MLO tomo · tomo slice 25/49.0]
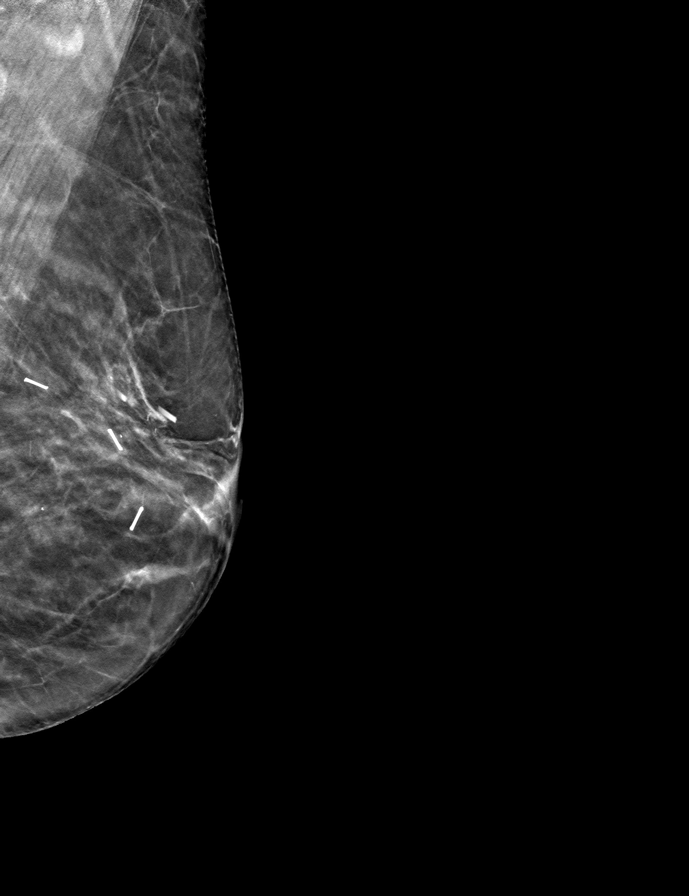

[R CC tomo · tomo slice 27/52.0]
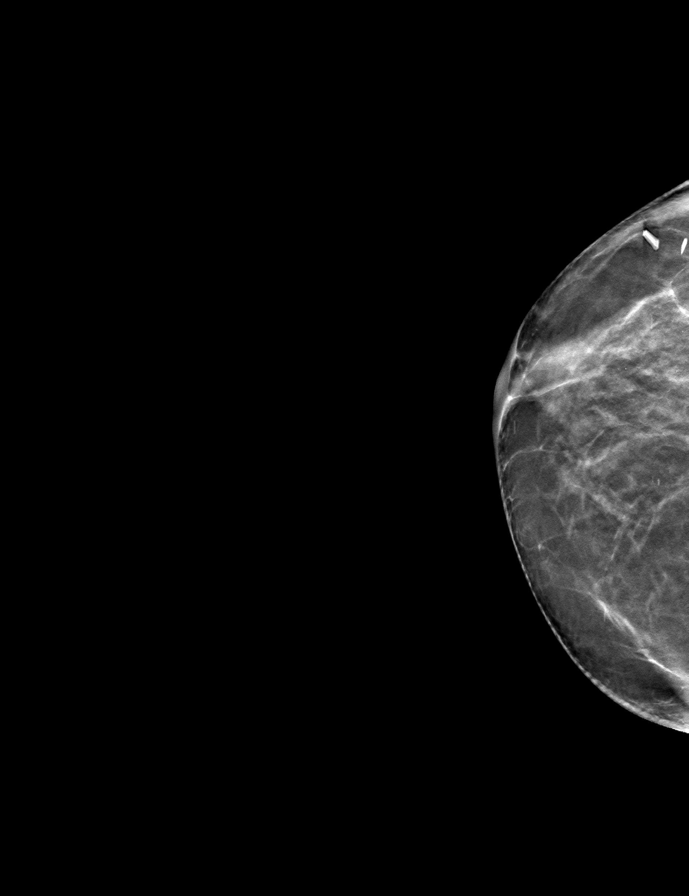

[R MLO tomo · tomo slice 27/54.0]
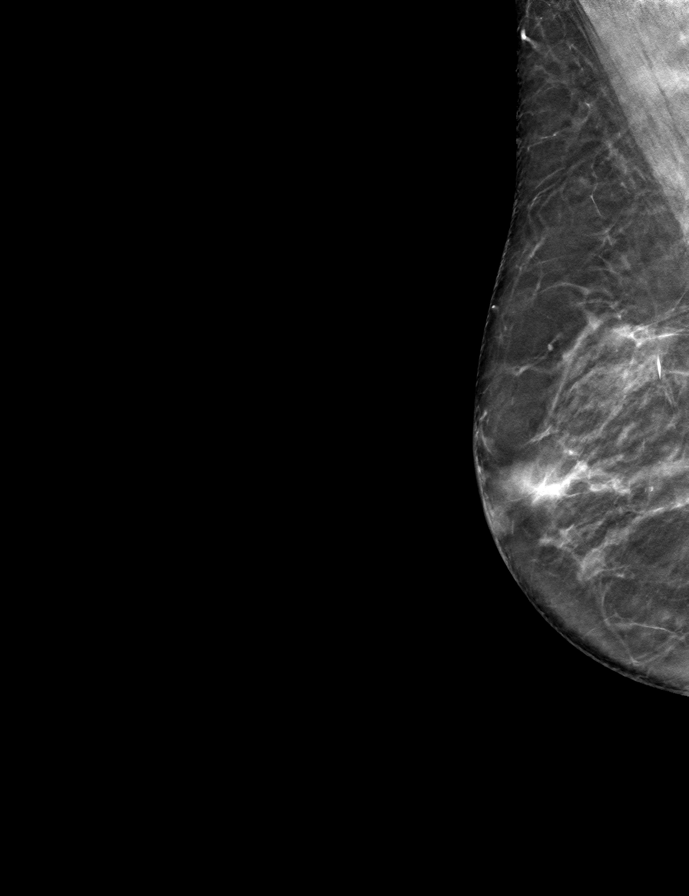

[9 of 25 positions shown; findings below may reference images not displayed]

ACR Breast Density Category b: There are scattered areas of
fibroglandular density.
FINDINGS: Post operative changes are seen in both breasts. No suspicious mass,
distortion, or microcalcifications are identified to suggest
presence of malignancy.
IMPRESSION: No mammographic evidence for malignancy.

RECOMMENDATION:
Per protocol, as the patient is now 2 or more years status post
lumpectomy, she may return to annual screening mammography in 1
year. However, given the history of breast cancer, the patient
remains eligible for annual diagnostic mammography if preferred.

I have discussed the findings and recommendations with the patient.
If applicable, a reminder letter will be sent to the patient
regarding the next appointment.

BI-RADS CATEGORY  2: Benign.

## 2023-08-08 NOTE — Therapy (Signed)
OUTPATIENT PHYSICAL THERAPY TREATMENT NOTE   Patient Name: Angela Buchanan MRN: [id number] DOB:[birth year], 72 y.o., female Today's Date: 08/08/2023  PCP:  Garth Bigness, MD   REFERRING PROVIDER:  Garth Bigness, MD       END OF SESSION:   PT End of Session - 08/08/23 1446     Visit Number 48    Date for PT Re-Evaluation 09/28/23    Authorization Type [employer]    Authorization Time Period 10 visits 10/21-12/27    Authorization - Visit Number 3    Authorization - Number of Visits 10    Progress Note Due on Visit 50    PT Start Time 1403    PT Stop Time 1445    PT Time Calculation (min) 42 min    Equipment Utilized During Treatment Gait belt    Activity Tolerance Patient tolerated treatment well    Behavior During Therapy WFL for tasks assessed/performed                            Past Medical History:  Diagnosis Date   Atypical chest pain 07/19/2021   Bigeminy    no current med.   Breast cancer (HCC) 06/2017   right   Bruises easily    CAD in native artery 10/31/2022   Carotid bruit 11/13/2019   Dental crowns present    History of diverticulitis    Hypertension    states under control with med., has been on med. x 5 yr.   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   Past Surgical History:  Procedure Laterality Date   ABDOMINAL HYSTERECTOMY     partial   BREAST LUMPECTOMY Right 06/22/2017   Malignant   BREAST LUMPECTOMY WITH RADIOACTIVE SEED AND SENTINEL LYMPH NODE BIOPSY Right 06/22/2017   Procedure: RIGHT BREAST LUMPECTOMY WITH RADIOACTIVE SEED AND RIGHT AXILLARY DEEP SENTINEL LYMPH NODE BIOPSY WITH BLUE DYE INJECTION;  Surgeon: Claud Kelp, MD;  Location: [facility];  Service: General;  Laterality: Right;   BREAST LUMPECTOMY WITH RADIOACTIVE SEED LOCALIZATION Left 06/22/2017   Procedure: LEFT BREAST LUMPECTOMY WITH RADIOACTIVE SEED LOCALIZATION;  Surgeon: Claud Kelp, MD;  Location: [facility];  Service: General;  Laterality: Left;   CATARACT EXTRACTION W/ INTRAOCULAR LENS  IMPLANT, BILATERAL Bilateral    EXCISION OF BREAST BIOPSY Left 06/22/2017   benign   LOOP RECORDER INSERTION N/A 05/16/2022   Procedure: LOOP RECORDER INSERTION;  Surgeon: Lanier Prude, MD;  Location: [facility];  Service: Cardiovascular;  Laterality: N/A;   PERCUTANEOUS PINNING Right 06/22/2022   Procedure: PERCUTANEOUS PINNING OF RIGHT HIP;  Surgeon: Bjorn Pippin, MD;  Location: [facility];  Service: Orthopedics;  Laterality: Right;   TONSILLECTOMY     age 40   Patient Active Problem List   Diagnosis Date Noted   Right spastic hemiplegia (HCC) 12/14/2022   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   CAD in native artery 10/31/2022   Malnutrition of moderate degree 06/22/2022   Closed fracture of femur, neck (HCC) 06/21/2022   Urinary frequency 06/21/2022   Hypokalemia 06/21/2022   Heart murmur 06/21/2022   Hypothyroidism 06/21/2022   Dysphagia 05/17/2022   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  CVA (cerebral vascular accident) (HCC) 05/12/2022   Osteoporosis 01/24/2022   Atypical chest pain 07/19/2021   Temporomandibular joint (TMJ) pain 07/16/2020   Referred otalgia of both ears 07/16/2020   PAC (premature atrial contraction) 05/11/2020   PVC (premature ventricular contraction) 05/11/2020   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   Carotid bruit 11/13/2019   Pure hypercholesterolemia 11/13/2019   Malignant neoplasm of upper-outer quadrant of right breast in female, estrogen receptor positive (HCC) 05/22/2017   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   Diplopia 09/17/2014   Dizziness and giddiness 09/17/2014   Essential  hypertension 07/22/2014   Chest pain 07/22/2014   Family history of heart disease 07/22/2014    REFERRING DIAG:  R29.6 (ICD-10-CM) - Repeated falls, closed fracture of Rt hip    THERAPY DIAG:  Muscle weakness (generalized)  Other abnormalities of gait and mobility  Abnormal posture  Rationale for Evaluation and Treatment Rehabilitation  PERTINENT HISTORY: hypertension, breast cancer, hyperlipidemia, Lt hip fracture secondary to fall 06/2022, CVA 06/2022   PRECAUTIONS:  Fall, Rt breast cancer with node removal, osteoporosis    SUBJECTIVE:                                                                                                                                                                                      SUBJECTIVE STATEMENT:  I am doing well.  I I walk slowly with the dog for about 45 minutes and I don't sit down during that time.   Pt spouse present during visit.   PAIN:  PAIN:  Are you having pain? No  OBJECTIVE: (objective measures completed at initial evaluation unless otherwise dated)  DIAGNOSTIC FINDINGS: Rt hip fracture and ORIF    COGNITION: Overall cognitive status: Within functional limits for tasks assessed                         SENSATION: WFL  POSTURE: rounded shoulders, forward head, flexed trunk , and weight shift left   PALPATION: NA   LOWER EXTREMITY ROM: Rt hip limited by 50%, hamstrings limited by 50% bil.    LOWER EXTREMITY MMT:   MMT Right eval Right  09/27/22 Left eval Right 10/30/22 Right  01/17/23 Right 05/23/23 Left 10/30/22 Left 01/17/23  Hip flexion 3+ 4- 4- 4- 4 4 4- 4+  Hip extension            Hip abduction 3-  4     4  Hip adduction            Hip internal rotation            Hip external rotation  Knee flexion 3- 4- 3+  4 4  4+  Knee extension 3+ 4 4- 4 painful hip 4+ 4+ 4- 4+  Ankle dorsiflexion 3+ 4- 4- 4-   4   Ankle plantarflexion            Ankle inversion            Ankle eversion              (Blank rows = not tested)   FUNCTIONAL TESTS:   Eval: Timed up and go (TUG): 1 min, 27 seconds   09/27/22: 5x sit to stand: 29 seconds with use of Lt hand  TUG: 27 seconds   10/30/22: 5x sit to stand: 23 sec use of LT hand     TUG: 26 sec with standard cane   11/20/22: 5x sit to stand: 18.68 seconds    TUG: 19.45 without device    3 minute walk test: 225 feet with a standard cane   01/10/23: 5x sit to stand: 15.77 seconds   01/17/23: TUG with cane: 15.41 seconds with cane    3 min walk test: 313 feet with cane    03/07/23: 3 min walk 275 feet with single point cane   TUG: 16 seconds   03/14/23: 6 min walk test: 723 feet with standard cane    TUG: 14.16 without cane    5x sit to stand: 12.15 seconds without hand   03/28/23 TUG 11 seconds without cane  5x STS 10.6 seconds no Ues  04/18/2023: 6 min walk test:  532 ft with [facility]  05/09/23: 6 min walk test with SPC: 667 feet - RPE 5  05/23/23: TUG: 11 seconds without cane 5x sit to stand: 12.71 seconds  6 min walk test: 763 feet  06/06/23:  5x sit to stand: 11.98 seconds    07/11/23:  5x sit to stand: 11.47 seconds   Short Physical Performance Battery:    Balance:       Side by side stance:    points (1)-1  Semi -tandem:     points (1)-1  Tandem:      points (2)-2    Gait Speed: (64m=9.84 feet) done 2x take the best time:      points                                           6.31 seconds-2 pts     Repeated Chair Stands:      points   (timer stopped when straight on 5th stand)                                             9.69 seconds- 4 pts  Total score=  10/12  points <10/12 predictive of 1 or more mobility limitations and increased risk of mobility disability 6 or less/12 associated with a higher fall rate         07/18/23: 6 min walk test-674 feet  TODAY'S TREATMENT:   DATE:  08/08/23 Nustep level 3 x10 minutes with LE only >55 spm PT present  throughout to discuss status Farmers carry 5# Lt UE-full length of hallway between gyms.  Rest at end and repeat.  step tap on 6" step 2x10 Rt only to work on clearance Sit to stand with 6# weight: 2x10  Leg Press (seat at 5) 55# 2x10, single leg: Lt 35#, Rt 15# 2x10 bil each- continues to be challenged by weight Weave in/out of cones: gait belt and CGA by PT.  Step to over hurdles with Lt UE support on barre: Lt lead and Rt lead with verbal cues for step height, sidestepping at barre   DATE:  07/30/23 Nustep level 3 x10 minutes with LE only >55 spm PT present throughout to discuss status Farmers carry 6# Lt UE from gym mat table to mirror 2 laps step tap on 6" step 2x10 Rt only to work on clearance Sit to stand with 6# weight: 2x10  Leg Press (seat at 5) 55# 2x10, single leg: Lt 35#, Rt 15# 2x10 bil each Step to over hurdles with Lt UE support on barre: Lt lead and Rt lead with verbal cues for step height, sidestepping at barre -verbal cues to clear Rt LE  DATE:  07/25/23 Nustep level 3 x10 minutes with LE only. >55 spm PT present throughout to discuss status Farmers carry 6# Lt UE from gym mat table to mirror 2 laps Alternating step tap on 6" step 2x10 with CGA Sit to stand with 6# weight: 2x10- tried 7# but too heavy Leg Press (seat at 5) 55# 2x10, single leg: Lt 35#, Rt 15# 2x10 bil each Step to over hurdles with Lt UE support on barre: Lt lead and Rt lead with verbal cues for step height, sidestepping at barre -verbal cues to clear Rt LE  PATIENT EDUCATION:  Education details: Access Code: [email or web address] Person educated: Patient Education method: Explanation, Demonstration, and Handouts Education comprehension: verbalized understanding and returned demonstration   HOME EXERCISE PROGRAM: Access Code: [email or web address] URL: [email or web address] Date: 09/27/2022 Prepared by: Tresa Endo  Exercises - Seated Long Arc Quad  - 3 x daily - 7 x weekly - 2 sets - 10 reps - 5 hold - Seated  March   - 3 x daily - 7 x weekly - 3 sets - 10 reps - Seated Heel Toe Raises   - 3 x daily - 7 x weekly - 2 sets - 10 reps - Seated Isometric Hip Adduction with Ball  - 3 x daily - 7 x weekly - 2 sets - 10 reps - Sit to Stand with Armchair  - 2 x daily - 7 x weekly - 2 sets - 5-10 reps - Standing Hip Abduction with Counter Support  - 1 x daily - 7 x weekly - 2 sets - 10 reps - Heel Raises with Counter Support  - 2 x daily - 7 x weekly - 2 sets - 10 reps  Patient Education - Walking with a Single [employer] - Modified 4 Point Gait Pattern  ASSESSMENT:   CLINICAL IMPRESSION:     Pt continues to walk regularly with her dog for about 45 minutes.  She continues to be more independent with exercises in the clinic and requires closer supervision with higher level tasks. She did well with increased distance with farmer's carry.   Her gait speed is slow and Rt LE weakness secondary to CVA puts her at falls risk due to difficulty clearing  the Rt foot when she is fatigued. Pt had improved foot clearance of Rt foot today with hurdles.  Patient tolerated treatment session well and is appropriately challenged by current exercises.  Patient will benefit from skilled PT to address the below impairments and improve overall function.    OBJECTIVE IMPAIRMENTS: Abnormal gait, decreased activity tolerance, decreased balance, decreased mobility, difficulty walking, decreased strength, decreased safety awareness, impaired perceived functional ability, impaired flexibility, postural dysfunction, and pain.    ACTIVITY LIMITATIONS: carrying, lifting, sitting, standing, stairs, transfers, hygiene/grooming, and locomotion level   PARTICIPATION LIMITATIONS: meal prep, cleaning, laundry, driving, shopping, and community activity   PERSONAL FACTORS: Age, Past/current experiences, and 1-2 comorbidities: CVA, falls, Rt hip fracture with ORIF  are also affecting patient's functional outcome.    REHAB POTENTIAL: Good    CLINICAL DECISION MAKING: Evolving/moderate complexity   EVALUATION COMPLEXITY: Moderate     GOALS: Goals reviewed with patient? Yes   SHORT TERM GOALS: Target date: 09/06/2022   Be independent in initial HEP Baseline: Goal status: Goal met 08/14/22   2.  Improve LE strength to perform sit to stand with moderate Lt UE support Baseline:  Goal status: Goal met 08/28/22  3.  Perform TUG in < or = to 60 seconds to reduce falls risk Baseline: 27 seconds (09/27/22) Goal status: MET      LONG TERM GOALS: Target date:  09/28/23   Be independent in advanced HEP Baseline: independent in current HEP and has tolerated progress when appropriate (07/18/23)  Goal status: in progress    2.  Perform TUG in < or = to 13 seconds to reduce falls risk Baseline: 11 seconds without cane (05/23/23) Goal status: met   3.  ambulate > or = to 750 feet in 6 minutes to improve community ambulation and independence  Baseline: this has fluctuated between 674-763 feet- not consistent ( 07/18/23) Goal status: revised     4.  perform 5x sit to stand in < or = to 12 seconds to reduce falls risk  Baseline: 11.47 seconds (07/11/23)  Goal Status: MET  5. Improve balance to walk her dog short distances with independence   Baseline: walking with dog now, slowly (07/18/23)  Goal status: MET      6. Improve gait speed to perform 45m walk in < or = to 3.6 seconds    Baseline: 6.31 seconds (07/18/23)    Goal Status: NEW   PLAN:   PT FREQUENCY: 1x/week   PT DURATION: 10 weeks   PLANNED INTERVENTIONS: Therapeutic exercises, Therapeutic activity, Neuromuscular re-education, Balance training, Gait training, Patient/Family education, Self Care, Joint mobilization, Stair training, Dry Needling, Electrical stimulation, Cryotherapy, Moist heat, Taping, Manual therapy, and Re-evaluation   PLAN FOR NEXT SESSION: Continue to work on gait , strength and balance. Endurance, change of direction.    Lorrene Reid,  PT 08/08/23 2:47 PM   [facility] [address] [place], [place] [address] Phone # [phone] Fax [phone]

## 2023-08-15 ENCOUNTER — Ambulatory Visit: Payer: Medicare PPO

## 2023-08-15 ENCOUNTER — Ambulatory Visit: Payer: Medicare PPO | Admitting: Occupational Therapy

## 2023-08-15 DIAGNOSIS — M6281 Muscle weakness (generalized): Secondary | ICD-10-CM

## 2023-08-15 DIAGNOSIS — R2689 Other abnormalities of gait and mobility: Secondary | ICD-10-CM | POA: Diagnosis not present

## 2023-08-15 DIAGNOSIS — I69351 Hemiplegia and hemiparesis following cerebral infarction affecting right dominant side: Secondary | ICD-10-CM | POA: Diagnosis not present

## 2023-08-15 DIAGNOSIS — R278 Other lack of coordination: Secondary | ICD-10-CM | POA: Diagnosis not present

## 2023-08-15 DIAGNOSIS — R293 Abnormal posture: Secondary | ICD-10-CM

## 2023-08-15 DIAGNOSIS — M25611 Stiffness of right shoulder, not elsewhere classified: Secondary | ICD-10-CM

## 2023-08-15 NOTE — Progress Notes (Signed)
[facility] Summary Report / Loop Recorder 

## 2023-08-15 NOTE — Therapy (Signed)
OUTPATIENT PHYSICAL THERAPY TREATMENT NOTE   Patient Name: Angela Buchanan MRN: [id number] DOB:12/19/1950, 72 y.o., female Today's Date: 08/15/2023  PCP:  Garth Bigness, MD   REFERRING PROVIDER:  Garth Bigness, MD       END OF SESSION:   PT End of Session - 08/15/23 1504     Visit Number 49    Date for PT Re-Evaluation 09/28/23    Authorization Type [employer]    Authorization Time Period 10 visits 10/21-12/27    Authorization - Visit Number 4    Authorization - Number of Visits 10    Progress Note Due on Visit 50    PT Start Time 1402    PT Stop Time 1448    PT Time Calculation (min) 46 min    Equipment Utilized During Treatment Gait belt    Activity Tolerance Patient tolerated treatment well    Behavior During Therapy WFL for tasks assessed/performed                             Past Medical History:  Diagnosis Date   Atypical chest pain 07/19/2021   Bigeminy    no current med.   Breast cancer (HCC) 06/2017   right   Bruises easily    CAD in native artery 10/31/2022   Carotid bruit 11/13/2019   Dental crowns present    History of diverticulitis    Hypertension    states under control with med., has been on med. x 5 yr.   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   Past Surgical History:  Procedure Laterality Date   ABDOMINAL HYSTERECTOMY     partial   BREAST LUMPECTOMY Right 06/22/2017   Malignant   BREAST LUMPECTOMY WITH RADIOACTIVE SEED AND SENTINEL LYMPH NODE BIOPSY Right 06/22/2017   Procedure: RIGHT BREAST LUMPECTOMY WITH RADIOACTIVE SEED AND RIGHT AXILLARY DEEP SENTINEL LYMPH NODE BIOPSY WITH BLUE DYE INJECTION;  Surgeon: Claud Kelp, MD;  Location: [facility];  Service: General;  Laterality: Right;   BREAST LUMPECTOMY WITH RADIOACTIVE SEED LOCALIZATION Left 06/22/2017   Procedure: LEFT BREAST LUMPECTOMY WITH RADIOACTIVE SEED LOCALIZATION;  Surgeon: Claud Kelp, MD;  Location: [facility];  Service: General;  Laterality: Left;   CATARACT EXTRACTION W/ INTRAOCULAR LENS  IMPLANT, BILATERAL Bilateral    EXCISION OF BREAST BIOPSY Left 06/22/2017   benign   LOOP RECORDER INSERTION N/A 05/16/2022   Procedure: LOOP RECORDER INSERTION;  Surgeon: Lanier Prude, MD;  Location: [facility];  Service: Cardiovascular;  Laterality: N/A;   PERCUTANEOUS PINNING Right 06/22/2022   Procedure: PERCUTANEOUS PINNING OF RIGHT HIP;  Surgeon: Bjorn Pippin, MD;  Location: [facility];  Service: Orthopedics;  Laterality: Right;   TONSILLECTOMY     age 66   Patient Active Problem List   Diagnosis Date Noted   Right spastic hemiplegia (HCC) 12/14/2022   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   CAD in native artery 10/31/2022   Malnutrition of moderate degree 06/22/2022   Closed fracture of femur, neck (HCC) 06/21/2022   Urinary frequency 06/21/2022   Hypokalemia 06/21/2022   Heart murmur 06/21/2022   Hypothyroidism 06/21/2022   Dysphagia 05/17/2022   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   CVA (cerebral vascular accident) (HCC) 05/12/2022   Osteoporosis 01/24/2022   Atypical chest pain 07/19/2021   Temporomandibular joint (TMJ) pain 07/16/2020   Referred otalgia of both ears 07/16/2020   PAC (premature atrial contraction) 05/11/2020   PVC (premature ventricular contraction) 05/11/2020   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   Carotid bruit 11/13/2019   Pure hypercholesterolemia 11/13/2019   Malignant neoplasm of upper-outer quadrant of right breast in female, estrogen receptor positive (HCC) 05/22/2017   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   Diplopia 09/17/2014   Dizziness and giddiness 09/17/2014   Essential  hypertension 07/22/2014   Chest pain 07/22/2014   Family history of heart disease 07/22/2014    REFERRING DIAG:  R29.6 (ICD-10-CM) - Repeated falls, closed fracture of Rt hip    THERAPY DIAG:  Muscle weakness (generalized)  Other abnormalities of gait and mobility  Abnormal posture  Rationale for Evaluation and Treatment Rehabilitation  PERTINENT HISTORY: hypertension, breast cancer, hyperlipidemia, Lt hip fracture secondary to fall 06/2022, CVA 06/2022   PRECAUTIONS:  Fall, Rt breast cancer with node removal, osteoporosis    SUBJECTIVE:                                                                                                                                                                                      SUBJECTIVE STATEMENT:  I am working on change of direction.   Pt spouse present during visit.   PAIN:  PAIN:  Are you having pain? No  OBJECTIVE: (objective measures completed at initial evaluation unless otherwise dated)  DIAGNOSTIC FINDINGS: Rt hip fracture and ORIF    COGNITION: Overall cognitive status: Within functional limits for tasks assessed                         SENSATION: WFL  POSTURE: rounded shoulders, forward head, flexed trunk , and weight shift left   PALPATION: NA   LOWER EXTREMITY ROM: Rt hip limited by 50%, hamstrings limited by 50% bil.    LOWER EXTREMITY MMT:   MMT Right eval Right  09/27/22 Left eval Right 10/30/22 Right  01/17/23 Right 05/23/23 Left 10/30/22 Left 01/17/23  Hip flexion 3+ 4- 4- 4- 4 4 4- 4+  Hip extension            Hip abduction 3-  4     4  Hip adduction            Hip internal rotation            Hip external rotation            Knee flexion 3- 4- 3+  4 4  4+  Knee extension 3+ 4 4- 4 painful hip 4+ 4+ 4- 4+  Ankle dorsiflexion 3+ 4- 4- 4-   4   Ankle plantarflexion            Ankle inversion            Ankle eversion             (Blank rows = not tested)   FUNCTIONAL TESTS:   Eval: Timed up  and go (TUG): 1 min, 27 seconds   09/27/22: 5x sit to stand: 29 seconds with use of Lt hand  TUG: 27 seconds   10/30/22: 5x sit to stand: 23 sec use of LT hand     TUG: 26 sec with standard cane   11/20/22: 5x sit to stand: 18.68 seconds    TUG: 19.45 without device    3 minute walk test: 225 feet with a standard cane   01/10/23: 5x sit to stand: 15.77 seconds   01/17/23: TUG with cane: 15.41 seconds with cane    3 min walk test: 313 feet with cane    03/07/23: 3 min walk 275 feet with single point cane   TUG: 16 seconds   03/14/23: 6 min walk test: 723 feet with standard cane    TUG: 14.16 without cane    5x sit to stand: 12.15 seconds without hand   03/28/23 TUG 11 seconds without cane  5x STS 10.6 seconds no Ues  04/18/2023: 6 min walk test:  532 ft with [facility]  05/09/23: 6 min walk test with SPC: 667 feet - RPE 5  05/23/23: TUG: 11 seconds without cane 5x sit to stand: 12.71 seconds  6 min walk test: 763 feet  06/06/23:  5x sit to stand: 11.98 seconds    07/11/23:  5x sit to stand: 11.47 seconds   Short Physical Performance Battery:    Balance:       Side by side stance:    points (1)-1  Semi -tandem:     points (1)-1  Tandem:      points (2)-2    Gait Speed: (92m=9.84 feet) done 2x take the best time:      points                                           6.31 seconds-2 pts     Repeated Chair Stands:      points   (timer stopped when straight on 5th stand)                                             9.69 seconds- 4 pts  Total score=  10/12  points <10/12 predictive of 1 or more mobility limitations and increased risk of mobility disability 6 or less/12 associated with a higher fall rate         07/18/23: 6 min walk test-674 feet  TODAY'S TREATMENT:   DATE:  08/15/23 Nustep level 3 x10 minutes with LE only >55 spm PT present throughout to discuss status Farmers carry 5# Lt UE-full length of  hallway between gyms.  Rest at end and repeat.  Gait with looking in varying directions up and down the hallway with gait belt Sit to stand with 6# weight: 2x10  Leg Press (seat at 5) 55# 2x10, single leg: Lt 35#, Rt 15# 2x10 bil each- continues to be challenged by weight Weave in/out of cones: gait belt and CGA by PT.  Step to over hurdles with Lt UE support on barre: Lt lead and Rt lead with verbal cues for step height, sidestepping at barre   DATE:  08/08/23 Nustep level 3 x10 minutes with LE only >55 spm PT present throughout to discuss status [employer] carry 5# Lt UE-full length of hallway between gyms.  Rest at end and repeat.  step tap on 6" step 2x10 Rt only to work on clearance Sit to stand with 6# weight: 2x10  Leg Press (seat at 5) 55# 2x10, single leg: Lt 35#, Rt 15# 2x10 bil each- continues to be challenged by weight Weave in/out of cones: gait belt and CGA by PT.  Step to over hurdles with Lt UE support on barre: Lt lead and Rt lead with verbal cues for step height, sidestepping at barre   DATE:  07/30/23 Nustep level 3 x10 minutes with LE only >55 spm PT present throughout to discuss status [employer] carry 6# Lt UE from gym mat table to mirror 2 laps step tap on 6" step 2x10 Rt only to work on clearance Sit to stand with 6# weight: 2x10  Leg Press (seat at 5) 55# 2x10, single leg: Lt 35#, Rt 15# 2x10 bil each Step to over hurdles with Lt UE support on barre: Lt lead and Rt lead with verbal cues for step height, sidestepping at barre -verbal cues to clear Rt LE   PATIENT EDUCATION:  Education details: Access Code: [email or web address] Person educated: Patient Education method: Explanation, Demonstration, and Handouts Education comprehension: verbalized understanding and returned demonstration   HOME EXERCISE PROGRAM: Access Code: [email or web address] URL: [email or web address] Date: 09/27/2022 Prepared by: Tresa Endo  Exercises - Seated Long Arc Quad  - 3 x daily - 7 x weekly - 2 sets  - 10 reps - 5 hold - Seated March   - 3 x daily - 7 x weekly - 3 sets - 10 reps - Seated Heel Toe Raises   - 3 x daily - 7 x weekly - 2 sets - 10 reps - Seated Isometric Hip Adduction with Ball  - 3 x daily - 7 x weekly - 2 sets - 10 reps - Sit to Stand with Armchair  - 2 x daily - 7 x weekly - 2 sets - 5-10 reps - Standing Hip Abduction with Counter Support  - 1 x daily - 7 x weekly - 2 sets - 10 reps - Heel Raises with Counter Support  - 2 x daily - 7 x weekly - 2 sets - 10 reps  Patient Education - Walking with a Single [employer] - Modified 4 Point Gait Pattern  ASSESSMENT:   CLINICAL IMPRESSION:     Pt continues to walk regularly with her dog for about 45 minutes.  She continues to be more independent with exercises in the clinic and requires closer supervision with higher level tasks. She did well with increased distance with cone weaving.   Her gait speed  is slow and Rt LE weakness secondary to CVA puts her at falls risk due to difficulty clearing the Rt foot when she is fatigued. Pt was challenged by looking in different directions, CGA by PT on gait belt.   Patient tolerated treatment session well and is appropriately challenged by current exercises.  Patient will benefit from skilled PT to address the below impairments and improve overall function.    OBJECTIVE IMPAIRMENTS: Abnormal gait, decreased activity tolerance, decreased balance, decreased mobility, difficulty walking, decreased strength, decreased safety awareness, impaired perceived functional ability, impaired flexibility, postural dysfunction, and pain.    ACTIVITY LIMITATIONS: carrying, lifting, sitting, standing, stairs, transfers, hygiene/grooming, and locomotion level   PARTICIPATION LIMITATIONS: meal prep, cleaning, laundry, driving, shopping, and community activity   PERSONAL FACTORS: Age, Past/current experiences, and 1-2 comorbidities: CVA, falls, Rt hip fracture with ORIF  are also affecting patient's functional  outcome.    REHAB POTENTIAL: Good   CLINICAL DECISION MAKING: Evolving/moderate complexity   EVALUATION COMPLEXITY: Moderate     GOALS: Goals reviewed with patient? Yes   SHORT TERM GOALS: Target date: 09/06/2022   Be independent in initial HEP Baseline: Goal status: Goal met 08/14/22   2.  Improve LE strength to perform sit to stand with moderate Lt UE support Baseline:  Goal status: Goal met 08/28/22  3.  Perform TUG in < or = to 60 seconds to reduce falls risk Baseline: 27 seconds (09/27/22) Goal status: MET      LONG TERM GOALS: Target date:  09/28/23   Be independent in advanced HEP Baseline: independent in current HEP and has tolerated progress when appropriate (07/18/23)  Goal status: in progress    2.  Perform TUG in < or = to 13 seconds to reduce falls risk Baseline: 11 seconds without cane (05/23/23) Goal status: met   3.  ambulate > or = to 750 feet in 6 minutes to improve community ambulation and independence  Baseline: this has fluctuated between 674-763 feet- not consistent ( 07/18/23) Goal status: revised     4.  perform 5x sit to stand in < or = to 12 seconds to reduce falls risk  Baseline: 11.47 seconds (07/11/23)  Goal Status: MET  5. Improve balance to walk her dog short distances with independence   Baseline: walking with dog now, slowly (07/18/23)  Goal status: MET      6. Improve gait speed to perform 70m walk in < or = to 3.6 seconds    Baseline: 6.31 seconds (07/18/23)    Goal Status: NEW   PLAN:   PT FREQUENCY: 1x/week   PT DURATION: 10 weeks   PLANNED INTERVENTIONS: Therapeutic exercises, Therapeutic activity, Neuromuscular re-education, Balance training, Gait training, Patient/Family education, Self Care, Joint mobilization, Stair training, Dry Needling, Electrical stimulation, Cryotherapy, Moist heat, Taping, Manual therapy, and Re-evaluation   PLAN FOR NEXT SESSION: Continue to work on gait , strength and balance. Endurance,  change of direction.    Lorrene Reid, PT 08/15/23 3:08 PM   [facility] [address] [place], [place] [address] Phone # [phone] Fax [phone]

## 2023-08-15 NOTE — Therapy (Signed)
OUTPATIENT OCCUPATIONAL THERAPY NEURO  Treatment Note & Re-cert  Patient Name: Angela Buchanan MRN: [id number] DOB:07-14-1951, 72 y.o., female Today's Date: 08/15/2023  PCP: Shon Hale, MD REFERRING PROVIDER: Shon Hale, MD    END OF SESSION:  OT End of Session - 08/15/23 1642     Visit Number 22    Number of Visits 27    Date for OT Re-Evaluation 09/07/23    Authorization Type [employer]    OT Start Time 1532    OT Stop Time 1614    OT Time Calculation (min) 42 min    Behavior During Therapy [facility] for tasks assessed/performed                                  Past Medical History:  Diagnosis Date   Atypical chest pain 07/19/2021   Bigeminy    no current med.   Breast cancer (HCC) 06/2017   right   Bruises easily    CAD in native artery 10/31/2022   Carotid bruit 11/13/2019   Dental crowns present    History of diverticulitis    Hypertension    states under control with med., has been on med. x 5 yr.   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   Past Surgical History:  Procedure Laterality Date   ABDOMINAL HYSTERECTOMY     partial   BREAST LUMPECTOMY Right 06/22/2017   Malignant   BREAST LUMPECTOMY WITH RADIOACTIVE SEED AND SENTINEL LYMPH NODE BIOPSY Right 06/22/2017   Procedure: RIGHT BREAST LUMPECTOMY WITH RADIOACTIVE SEED AND RIGHT AXILLARY DEEP SENTINEL LYMPH NODE BIOPSY WITH BLUE DYE INJECTION;  Surgeon: Claud Kelp, MD;  Location: [facility];  Service: General;  Laterality: Right;   BREAST LUMPECTOMY WITH RADIOACTIVE SEED LOCALIZATION Left 06/22/2017   Procedure: LEFT BREAST LUMPECTOMY WITH RADIOACTIVE SEED LOCALIZATION;  Surgeon: Claud Kelp, MD;  Location: [facility];  Service: General;  Laterality: Left;   CATARACT EXTRACTION W/ INTRAOCULAR LENS  IMPLANT, BILATERAL Bilateral    EXCISION OF BREAST BIOPSY Left 06/22/2017   benign   LOOP RECORDER INSERTION N/A 05/16/2022   Procedure: LOOP RECORDER INSERTION;  Surgeon: Lanier Prude, MD;  Location: [facility];  Service: Cardiovascular;  Laterality: N/A;   PERCUTANEOUS PINNING Right 06/22/2022   Procedure: PERCUTANEOUS PINNING OF RIGHT HIP;  Surgeon: Bjorn Pippin, MD;  Location: [facility];  Service: Orthopedics;  Laterality: Right;   TONSILLECTOMY     age 74   Patient Active Problem List   Diagnosis Date Noted   Right spastic hemiplegia (HCC) 12/14/2022   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 05/11/2020   PVC (  premature ventricular contraction) 05/11/2020   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   Carotid bruit 11/13/2019   Pure hypercholesterolemia 11/13/2019   Malignant neoplasm of upper-outer quadrant of right breast in female, estrogen receptor positive (HCC) 05/22/2017   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   Diplopia 09/17/2014   Dizziness and giddiness 09/17/2014   Essential hypertension 07/22/2014   Chest pain 07/22/2014   Family history of heart disease 07/22/2014    ONSET DATE: 05/13/22 (new referral 04/09/23)  REFERRING DIAG: G83.23 (ICD-10-CM) - 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 Pt accompanied by: self and significant other ([name])  PERTINENT HISTORY: hypertension, breast cancer, hyperlipidemia, Lt hip fracture secondary to fall 06/2022, CVA 05/2022    PRECAUTIONS: Fall, Rt breast cancer with node removal, osteoporosis   WEIGHT BEARING RESTRICTIONS: No  PAIN:  Are you having pain? No  FALLS: Has patient fallen in last 6 months? No  LIVING ENVIRONMENT: Lives with: lives with their spouse Lives in: House/apartment Stairs: Yes: External: 2 steps; on left going up Has following equipment at home: Quad cane large base, Environmental consultant - 2 wheeled, Hemi walker, Wheelchair (manual), shower chair, and Grab bars  PLOF: Independent and Independent with basic ADLs  PATIENT GOALS: to have better use my arm and hand  OBJECTIVE:   HAND DOMINANCE: Right  ADLs:  Transfers/ambulation related to ADLs: Utilizes [facility] for mobility in the community, 80% in the house but will walk without occasionally Eating: husband cuts foods, utilizing built up handles for self-feeding Grooming: needs assistance with washing hair UB Dressing: needs assistance with fastening bra LB Dressing: Mod-max assist for donning pants due to fear of falling, able to don socks and slip on shoes - unable to tie shoes Toileting: Mod I Bathing: Supervision Tub Shower transfers: Supervision, but able to complete transfers with use of grab bars Equipment: Shower seat with back, Grab bars, and Walk in shower  IADLs: Handwriting:  TBA at next session  MOBILITY  STATUS: Needs Assist: utilized [facility] for mobility and Hx of falls  POSTURE COMMENTS:  rounded shoulders and forward head  ACTIVITY TOLERANCE: Activity tolerance: fatigues with increased mobility and effort  FUNCTIONAL OUTCOME MEASURES: Upper Extremity Functional Scale (UEFS): TBD  UPPER EXTREMITY ROM:    Active ROM Right eval Right 05/31/23 Right 06/14/23 Right 07/30/22  Shoulder flexion 87 93 98 94  Shoulder abduction 78 82 86 85  Shoulder adduction      Shoulder extension      Shoulder internal rotation 90% 100% 100%   Shoulder external rotation 50% 75% 95% (requires increased time)   Elbow flexion 132 137 138   Elbow extension -17 -12 -12   Wrist flexion 19 40 48   Wrist extension 35 35 40   Wrist ulnar deviation 15 10 15    Wrist radial deviation 13 15 15    Wrist pronation WNL  WNL   Wrist supination 20 from neutral 65* 65   (Blank rows = not tested)  UPPER EXTREMITY MMT:     MMT Right eval Left eval  Shoulder flexion    Shoulder abduction  Shoulder adduction    Shoulder extension    Shoulder internal rotation    Shoulder external rotation    Middle trapezius    Lower trapezius    Elbow flexion    Elbow extension    Wrist flexion    Wrist extension    Wrist ulnar deviation    Wrist radial deviation    Wrist pronation    Wrist supination    (Blank rows = not tested)    Active ROM Extension (as flexion to loose gross grasp) Right eval Right 05/31/23 Right 06/14/23  Thumb MCP (0-60)     Thumb IP (0-80)     Thumb Radial abd/add (0-55)     Thumb Palmar abd/add (0-45)     Thumb opposition to index     Index MCP (0-90) 0  0  Index PIP (0-100) -30 -50 -22  Index DIP (0-70) -40 -35 -35  Long MCP (0-90) -5 0 0  Long PIP (0-100) -45 -35 -40  Long DIP (0-70) -5 -25 -15  Ring MCP (0-90_ 0 0   Ring PIP (0-100) -40 -40   Ring DIP (0-70) -20 -20   Little MCP (0-90) -7 0   Little PIP (0-100) -25 -25   Little DIP (0-70) -5 0   (Blank rows = not tested)     HAND FUNCTION: 07/31/23 Grip strength: Right: 6#  05/31/23 Grip strength: Right: 9#  Grip strength: Right: 7,9 = 8# lbs; Left: 30 lbs  COORDINATION: 07/31/23 Box and blocs: Right 15 blocks  05/31/23 9 Hole Peg test: Right: able to pick up one peg with thumb and long finger, however unable to rotate to place into peg board Box and Blocks:  Right 15 blocks  Finger Nose Finger test: slow and deliberate with RUE, but able to touch both nose and finger at this time 9 Hole Peg test: Right: unable to pick up any pegs with RUE  Box and Blocks:  Right 16 blocks, Left 55 blocks  SENSATION: Light touch: WFL Hot/Cold: WFL  EDEMA: no swelling on eval  MUSCLE TONE: RUE: Mild and Hypertonic  COGNITION: Overall cognitive status: Within functional limits for tasks assessed  VISION: Subjective report: pt reports wearing glasses more since CVA Baseline vision: Wears glasses all the time, double vision after cataract removal Visual history: cataracts  OBSERVATIONS: Pt attempting grasp and pinch activities, frequently not incorporating index finger on R with increased flexion away from task.   TODAY'S TREATMENT:                                                           08/15/23 Cones: engaged in functional reach with picking up cups from mat table at R side and stacking in front on table top.  OT providing cues to visually attend to hand when grasping and releasing to focus on increased opening of hand.  OT downgraded task to skinny cones instead of cups to allow for increased success with grasp. Cards: engaged in picking up cards with focus on [facility] and pinch to pick up, followed by forearm rotation and shoulder flexion to reach to place into matching stacks by suit.  Pt requiring cards flat on table top instead of picking up from stack and utilizing mod sliding of cards to pick up vs picking up from table  top.  Pt demonstrating good shoulder flexion and abduction when placing into matching  stacks. Connect 4 pieces:  engaged in picking up and placing connect 4 pieces into grid to further challenge pinch to pick up from table top and then rotate to place into vertical grid.  Pt demonstrating improved success with picking up connect 4 pieces over cards.  OT then increased challenge to having pt stack and [employer] 4 pieces.  Pt requiring increased time to stack, knocking over 2.  OT providing cues to visually attend to RUE to ensure proper grasp/pinch and then pt demonstrating improved success with stacking/unstacking.    07/31/23 Self-care: lengthy conversation with [employer] rep regarding questions about process, outcomes, and pt concerns in regards to undergoing surgical procedure.  OT and [employer] rep (also an OT) answering questions within scope and encouraging pt to f/u with [employer] ambassador (previous PT who has undergone Viv[employer] implant and therapy). UE ROM: engaging in functional reach to pick up Jenga blocks from table top to faciliate shoulder abduction and grasp.  Pt then challenged to rotate items to place on 3x3 pattern.  Pt with increased difficulty with rotation of blocks clockwise. Bean bag toss: engaged in tossing bean bags in standing with focus on functional reach, grasp, and then release.  Pt continues to demonstrate decreased index finger extension during opening to prepare for grasp, but overall improvements in gross grasp.  Pt demonstrating good shoulder flexion and release of grasp to toss bean bags at target.     PATIENT EDUCATION: Education details: ongoing condition specific education. Person educated: Patient and Spouse Education method: Explanation, Demonstration, and Verbal cues Education comprehension: verbalized understanding and needs further education  HOME EXERCISE PROGRAM: Access Code: [email or web address] URL: [email or web address] Date: 04/23/2023 Prepared by: [facility] - [facility]    GOALS: Goals  reviewed with patient? Yes   NEW SHORT TERM GOALS: Target date: 07/06/23  Pt and spouse will be independent with advanced HEP for ROM, strengthening, and coordination to D/C at end of this certification period. Baseline: Goal status: IN PROGRESS  2.  Pt will verbalize understanding of additional treatment interventions (ie Vivistim) to facilitate increased functional return of dominant RUE. Baseline: Goal status: IN PROGRESS    NEW LONG TERM GOALS: Target date: 09/07/23  Pt will demonstrate improved functional grasp in RUE by 5# to turn on/off water, open jar, and self-feeding.  Baseline: R: 9# on 05/31/23 (6# on 07/31/23) Goal status: REVISED  2.  Pt will demonstrate improved UE functional use for ADLs as evidenced by increasing box/ blocks score by 4 blocks with RUE. Baseline: R: 18 blocks on 06/14/23 (15 blocks on 07/17/23) Goal status: IN PROGRESS  3.  Pt will demonstrate improved shoulder flexion as needed for IADLs (to place a dish in the cabinet and/or laundry on a shelf at eye level) Baseline:  Goal status: IN PROGRESS  4.  Pt will demonstrate improvement of finger extension by by 10* TAM of index and 15* TAM of long ringer finger to allow for more functional hand opening.  Baseline: -57 IF on 06/14/23 , -55 LF Goal status: IN PROGRESS  5.  Pt will demonstrate improved handwriting with ability to write 10 item simple grocery list with 90% legibility. Baseline:  Goal status: IN PROGRESS   ASSESSMENT:  CLINICAL IMPRESSION: Pt demonstrating improved hand opening and pinch on Connect 4 pieces when cued to visually attend to hand and quality of movement.  Pt demonstrating improvements in shoulder  flexion, abduction, and supination in more functional, leisure tasks.  PERFORMANCE DEFICITS: in functional skills including ADLs, IADLs, coordination, dexterity, tone, ROM, strength, pain, flexibility, Fine motor control, Gross motor control, mobility, balance, body mechanics,  endurance, decreased knowledge of precautions, decreased knowledge of use of DME, and UE functional use and psychosocial skills including environmental adaptation and routines and behaviors.   IMPAIRMENTS: are limiting patient from ADLs and IADLs.   CO-MORBIDITIES: may have co-morbidities  that affects occupational performance. Patient will benefit from skilled OT to address above impairments and improve overall function.  MODIFICATION OR ASSISTANCE TO COMPLETE EVALUATION: Min-Moderate modification of tasks or assist with assess necessary to complete an evaluation.  OT OCCUPATIONAL PROFILE AND HISTORY: Detailed assessment: Review of records and additional review of physical, cognitive, psychosocial history related to current functional performance.  CLINICAL DECISION MAKING: Moderate - several treatment options, min-mod task modification necessary  REHAB POTENTIAL: Good  EVALUATION COMPLEXITY: Moderate    PLAN:  OT FREQUENCY: 2x/week  OT DURATION: 6 weeks  PLANNED INTERVENTIONS: self care/ADL training, therapeutic exercise, therapeutic activity, neuromuscular re-education, manual therapy, passive range of motion, functional mobility training, splinting, electrical stimulation, ultrasound, compression bandaging, moist heat, cryotherapy, patient/family education, psychosocial skills training, energy conservation, coping strategies training, and DME and/or AE instructions  RECOMMENDED OTHER SERVICES: NA  CONSULTED AND AGREED WITH PLAN OF CARE: Patient and family member/caregiver  PLAN FOR NEXT SESSION: Engage in shoulder flexion in sitting and standing to facilitate increased strength and endurance with ROM, continue with gross grasp tasks, further address wrist flexion/extension and ulnar/radial deviation, supination, and finger flexion and extension.    Rosalio Loud, OTR/L 08/15/2023, 4:43 PM  [facility] at [facility] [address] [place], [address] [place], [place] [address] Phone # [phone] Fax # [phone]

## 2023-08-20 ENCOUNTER — Ambulatory Visit: Payer: Medicare PPO | Attending: Family Medicine | Admitting: Occupational Therapy

## 2023-08-20 DIAGNOSIS — R278 Other lack of coordination: Secondary | ICD-10-CM | POA: Insufficient documentation

## 2023-08-20 DIAGNOSIS — I69351 Hemiplegia and hemiparesis following cerebral infarction affecting right dominant side: Secondary | ICD-10-CM | POA: Insufficient documentation

## 2023-08-20 DIAGNOSIS — M25611 Stiffness of right shoulder, not elsewhere classified: Secondary | ICD-10-CM | POA: Diagnosis not present

## 2023-08-20 DIAGNOSIS — M6281 Muscle weakness (generalized): Secondary | ICD-10-CM | POA: Insufficient documentation

## 2023-08-20 NOTE — Therapy (Signed)
OUTPATIENT OCCUPATIONAL THERAPY NEURO  Treatment Note  Patient Name: Angela Buchanan MRN: [id number] DOB:October 22, 1950, 72 y.o., female Today's Date: 08/20/2023  PCP: Shon Hale, MD REFERRING PROVIDER: Shon Hale, MD    END OF SESSION:  OT End of Session - 08/20/23 1404     Visit Number 23    Number of Visits 27    Date for OT Re-Evaluation 09/07/23    Authorization Type [employer]    OT Start Time 1315    OT Stop Time 1400    OT Time Calculation (min) 45 min    Behavior During Therapy [facility] for tasks assessed/performed                                   Past Medical History:  Diagnosis Date   Atypical chest pain 07/19/2021   Bigeminy    no current med.   Breast cancer (HCC) 06/2017   right   Bruises easily    CAD in native artery 10/31/2022   Carotid bruit 11/13/2019   Dental crowns present    History of diverticulitis    Hypertension    states under control with med., has been on med. x 5 yr.   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   Past Surgical History:  Procedure Laterality Date   ABDOMINAL HYSTERECTOMY     partial   BREAST LUMPECTOMY Right 06/22/2017   Malignant   BREAST LUMPECTOMY WITH RADIOACTIVE SEED AND SENTINEL LYMPH NODE BIOPSY Right 06/22/2017   Procedure: RIGHT BREAST LUMPECTOMY WITH RADIOACTIVE SEED AND RIGHT AXILLARY DEEP SENTINEL LYMPH NODE BIOPSY WITH BLUE DYE INJECTION;  Surgeon: Claud Kelp, MD;  Location: [facility];  Service: General;  Laterality: Right;   BREAST LUMPECTOMY WITH RADIOACTIVE SEED LOCALIZATION Left 06/22/2017   Procedure: LEFT BREAST LUMPECTOMY WITH RADIOACTIVE SEED LOCALIZATION;  Surgeon: Claud Kelp, MD;   Location: [facility];  Service: General;  Laterality: Left;   CATARACT EXTRACTION W/ INTRAOCULAR LENS  IMPLANT, BILATERAL Bilateral    EXCISION OF BREAST BIOPSY Left 06/22/2017   benign   LOOP RECORDER INSERTION N/A 05/16/2022   Procedure: LOOP RECORDER INSERTION;  Surgeon: Lanier Prude, MD;  Location: [facility];  Service: Cardiovascular;  Laterality: N/A;   PERCUTANEOUS PINNING Right 06/22/2022   Procedure: PERCUTANEOUS PINNING OF RIGHT HIP;  Surgeon: Bjorn Pippin, MD;  Location: [facility];  Service: Orthopedics;  Laterality: Right;   TONSILLECTOMY     age 10   Patient Active Problem List   Diagnosis Date Noted   Right spastic hemiplegia (HCC) 12/14/2022   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 05/11/2020   PVC (premature  ventricular contraction) 05/11/2020   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   Carotid bruit 11/13/2019   Pure hypercholesterolemia 11/13/2019   Malignant neoplasm of upper-outer quadrant of right breast in female, estrogen receptor positive (HCC) 05/22/2017   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   Diplopia 09/17/2014   Dizziness and giddiness 09/17/2014   Essential hypertension 07/22/2014   Chest pain 07/22/2014   Family history of heart disease 07/22/2014    ONSET DATE: 05/13/22 (new referral 04/09/23)  REFERRING DIAG: G83.23 (ICD-10-CM) - 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 Pt accompanied by: self and significant other ([name])  PERTINENT HISTORY: hypertension, breast cancer, hyperlipidemia, Lt hip fracture secondary to fall 06/2022, CVA 05/2022    PRECAUTIONS: Fall, Rt breast cancer with node removal, osteoporosis   WEIGHT BEARING RESTRICTIONS: No  PAIN:  Are you having pain? Yes: NPRS scale: 1-2/10 Pain location: R hand Pain description: aching Aggravating factors: overuse Relieving factors: rest  FALLS: Has patient fallen in last 6 months? No  LIVING ENVIRONMENT: Lives with: lives with their spouse Lives in: House/apartment Stairs: Yes: External: 2 steps; on left going up Has following equipment at home: Quad cane large base, Environmental consultant - 2 wheeled, Hemi walker, Wheelchair (manual), shower chair, and Grab bars  PLOF: Independent and Independent with basic ADLs  PATIENT GOALS: to have better use my arm and hand  OBJECTIVE:   HAND DOMINANCE: Right  ADLs:  Transfers/ambulation related to ADLs: Utilizes [facility] for mobility in the community, 80% in the house but will walk without occasionally Eating: husband cuts foods, utilizing built up handles for self-feeding Grooming: needs assistance with washing hair UB Dressing: needs assistance with fastening bra LB Dressing: Mod-max assist for donning pants due to fear of falling, able to don socks and slip on shoes - unable to tie shoes Toileting: Mod I Bathing: Supervision Tub Shower transfers: Supervision, but able to complete transfers with use of grab bars Equipment: Shower  seat with back, Grab bars, and Walk in shower  IADLs: Handwriting:  TBA at next session  MOBILITY STATUS: Needs Assist: utilized [facility] for mobility and Hx of falls  POSTURE COMMENTS:  rounded shoulders and forward head  ACTIVITY TOLERANCE: Activity tolerance: fatigues with increased mobility and effort  FUNCTIONAL OUTCOME MEASURES: Upper Extremity Functional Scale (UEFS): TBD  UPPER EXTREMITY ROM:    Active ROM Right eval Right 05/31/23 Right 06/14/23 Right 07/30/22  Shoulder flexion 87 93 98 94  Shoulder abduction 78 82 86 85  Shoulder adduction      Shoulder extension      Shoulder internal rotation 90% 100% 100%   Shoulder external rotation 50% 75% 95% (requires increased time)   Elbow flexion 132 137 138   Elbow extension -17 -12 -12   Wrist flexion 19 40 48   Wrist extension 35 35 40   Wrist ulnar deviation 15 10 15    Wrist radial deviation 13 15 15    Wrist pronation WNL  WNL   Wrist supination 20 from neutral 65* 65   (Blank rows = not tested)  UPPER EXTREMITY MMT:     MMT Right eval Left eval  Shoulder flexion    Shoulder abduction    Shoulder adduction    Shoulder extension    Shoulder internal rotation    Shoulder external rotation    Middle trapezius    Lower trapezius    Elbow flexion    Elbow extension    Wrist flexion    Wrist extension    Wrist ulnar deviation    Wrist radial deviation    Wrist pronation    Wrist supination    (Blank rows = not tested)    Active ROM Extension (as flexion to loose gross grasp) Right eval Right 05/31/23 Right 06/14/23  Thumb MCP (0-60)     Thumb IP (0-80)     Thumb Radial abd/add (0-55)     Thumb Palmar abd/add (0-45)     Thumb opposition to index     Index MCP (0-90) 0  0  Index PIP (0-100) -30 -50 -22  Index DIP (0-70) -40 -35 -35  Long MCP (0-90) -5 0 0  Long PIP (0-100) -45 -35 -40  Long DIP (0-70) -5 -25 -15  Ring MCP (0-90_ 0 0   Ring PIP (0-100) -40 -40   Ring DIP (0-70) -20 -20   Little  MCP (0-90) -7 0   Little PIP (0-100) -25 -25   Little DIP (0-70) -5 0   (Blank rows = not tested)    HAND FUNCTION: 07/31/23 Grip strength: Right: 6#  05/31/23 Grip strength: Right: 9#  Grip strength: Right: 7,9 = 8# lbs; Left: 30 lbs  COORDINATION: 07/31/23 Box and blocs: Right 15 blocks  05/31/23 9 Hole Peg test: Right: able to pick up one peg with thumb and long finger, however unable to rotate to place into peg board Box and Blocks:  Right 15 blocks  Finger Nose Finger test: slow and deliberate with RUE, but able to touch both nose and finger at this time 9 Hole Peg test: Right: unable to pick up any pegs with RUE  Box and Blocks:  Right 16 blocks, Left 55 blocks  SENSATION: Light touch: WFL Hot/Cold: WFL  EDEMA: no swelling on eval  MUSCLE TONE: RUE: Mild and Hypertonic  COGNITION: Overall cognitive status: Within functional limits for tasks assessed  VISION: Subjective report: pt reports wearing glasses more since CVA Baseline vision: Wears glasses all the time, double vision after cataract removal Visual history: cataracts  OBSERVATIONS: Pt attempting grasp and pinch activities, frequently not incorporating index finger on R with increased flexion away from task.   TODAY'S TREATMENT:                                                           08/20/23 Bracing: pt reports wearing resting hand splint "a lot" because she likes the way it feels.  OT encouraged her to wear just at night time to allow for functional use of RUE during the day.  Pt brought in other handmade splint with built up foam roll in hand - pt reports wearing when watching TV and focusing on grip strength.  OT educated on hand placement to ensure index finger extension around foam roll as well as increased focus on functional grip - on wash cloth, opening/closing lids/crumpling paper. Crumpling paper: engaged in crumpling paper with R hand and with both hands with OT providing cues  to open fingers for  increased "scrunch"  Clothespins: 2# resistance clothespins with RUE for mid functional reaching and sustained pinch. Pt picking up and placing on horizontal dowel with gross grasp but able to complete.  OT then challenging pt to place on vertical dowel to facilitate increased shoulder flexion.  Pt able to reach to ~80* with resistance clothespins with increased difficulty.  Review HEP: OT removed redundant and/or duplicate exercises and starred most important ones.  Pt demonstrating and verbalizing understanding of each.  OT reviewed functional carryover of each, encouraging not just completion of HEP but to continue to engage in functional tasks that incorporate each exercise.   08/15/23 Cones: engaged in functional reach with picking up cups from mat table at R side and stacking in front on table top.  OT providing cues to visually attend to hand when grasping and releasing to focus on increased opening of hand.  OT downgraded task to skinny cones instead of cups to allow for increased success with grasp. Cards: engaged in picking up cards with focus on [facility] and pinch to pick up, followed by forearm rotation and shoulder flexion to reach to place into matching stacks by suit.  Pt requiring cards flat on table top instead of picking up from stack and utilizing mod sliding of cards to pick up vs picking up from table top.  Pt demonstrating good shoulder flexion and abduction when placing into matching stacks. Connect 4 pieces:  engaged in picking up and placing connect 4 pieces into grid to further challenge pinch to pick up from table top and then rotate to place into vertical grid.  Pt demonstrating improved success with picking up connect 4 pieces over cards.  OT then increased challenge to having pt stack and [employer] 4 pieces.  Pt requiring increased time to stack, knocking over 2.  OT providing cues to visually attend to RUE to ensure proper grasp/pinch and then pt demonstrating improved success  with stacking/unstacking.    07/31/23 Self-care: lengthy conversation with [employer] rep regarding questions about process, outcomes, and pt concerns in regards to undergoing surgical procedure.  OT and [employer] rep (also an OT) answering questions within scope and encouraging pt to f/u with [employer] ambassador (previous PT who has undergone Vivistim implant and therapy). UE ROM: engaging in functional reach to pick up Jenga blocks from table top to faciliate shoulder abduction and grasp.  Pt then challenged to rotate items to place on 3x3 pattern.  Pt with increased difficulty with rotation of blocks clockwise. Bean bag toss: engaged in tossing bean bags in standing with focus on functional reach, grasp, and then release.  Pt continues to demonstrate decreased index finger extension during opening to prepare for grasp, but overall improvements in gross grasp.  Pt demonstrating good shoulder flexion and release of grasp to toss bean bags at target.     PATIENT EDUCATION: Education details: ongoing condition specific education. Person educated: Patient and Spouse Education method: Explanation, Demonstration, and Verbal cues Education comprehension: verbalized understanding and needs further education  HOME EXERCISE PROGRAM: Access Code: [email or web address] URL: [email or web address] Date: 04/23/2023 Prepared by: [facility] - Outpatient  Rehab - [facility]    GOALS: Goals reviewed with patient? Yes   NEW SHORT TERM GOALS: Target date: 07/06/23  Pt and spouse will be independent with advanced HEP for ROM, strengthening, and coordination to D/C at end of this certification period. Baseline: Goal status: IN PROGRESS  2.  Pt will verbalize understanding of additional treatment interventions (  ie Vivistim) to facilitate increased functional return of dominant RUE. Baseline: Goal status: IN PROGRESS    NEW LONG TERM GOALS: Target date: 09/07/23  Pt will demonstrate improved  functional grasp in RUE by 5# to turn on/off water, open jar, and self-feeding.  Baseline: R: 9# on 05/31/23 (6# on 07/31/23) Goal status: REVISED  2.  Pt will demonstrate improved UE functional use for ADLs as evidenced by increasing box/ blocks score by 4 blocks with RUE. Baseline: R: 18 blocks on 06/14/23 (15 blocks on 07/17/23) Goal status: IN PROGRESS  3.  Pt will demonstrate improved shoulder flexion as needed for IADLs (to place a dish in the cabinet and/or laundry on a shelf at eye level) Baseline:  Goal status: IN PROGRESS  4.  Pt will demonstrate improvement of finger extension by by 10* TAM of index and 15* TAM of long ringer finger to allow for more functional hand opening.  Baseline: -57 IF on 06/14/23 , -55 LF Goal status: IN PROGRESS  5.  Pt will demonstrate improved handwriting with ability to write 10 item simple grocery list with 90% legibility. Baseline:  Goal status: IN PROGRESS   ASSESSMENT:  CLINICAL IMPRESSION: Pt still limited in shoulder flexion, particularly with functional tasks and against resistance.  Pt tolerating activities this session, reporting and demonstrating difficulty with resistance clothespins especially when incorporating functional reach.  Pt appreciative of cleaning up and prioritizing particular exercises/movements.  PERFORMANCE DEFICITS: in functional skills including ADLs, IADLs, coordination, dexterity, tone, ROM, strength, pain, flexibility, Fine motor control, Gross motor control, mobility, balance, body mechanics, endurance, decreased knowledge of precautions, decreased knowledge of use of DME, and UE functional use and psychosocial skills including environmental adaptation and routines and behaviors.   IMPAIRMENTS: are limiting patient from ADLs and IADLs.   CO-MORBIDITIES: may have co-morbidities  that affects occupational performance. Patient will benefit from skilled OT to address above impairments and improve overall  function.  MODIFICATION OR ASSISTANCE TO COMPLETE EVALUATION: Min-Moderate modification of tasks or assist with assess necessary to complete an evaluation.  OT OCCUPATIONAL PROFILE AND HISTORY: Detailed assessment: Review of records and additional review of physical, cognitive, psychosocial history related to current functional performance.  CLINICAL DECISION MAKING: Moderate - several treatment options, min-mod task modification necessary  REHAB POTENTIAL: Good  EVALUATION COMPLEXITY: Moderate    PLAN:  OT FREQUENCY: 2x/week  OT DURATION: 6 weeks  PLANNED INTERVENTIONS: self care/ADL training, therapeutic exercise, therapeutic activity, neuromuscular re-education, manual therapy, passive range of motion, functional mobility training, splinting, electrical stimulation, ultrasound, compression bandaging, moist heat, cryotherapy, patient/family education, psychosocial skills training, energy conservation, coping strategies training, and DME and/or AE instructions  RECOMMENDED OTHER SERVICES: NA  CONSULTED AND AGREED WITH PLAN OF CARE: Patient and family member/caregiver  PLAN FOR NEXT SESSION: Engage in shoulder flexion in supine and progress to completing in sitting and standing to facilitate increased strength and endurance with ROM, continue with gross grasp tasks, further address wrist flexion/extension and ulnar/radial deviation, supination, and finger flexion and extension.    Rosalio Loud, OTR/L 08/20/2023, 2:04 PM  [facility] at [facility] [address] [place], [address] [place], [place] [address] Phone # [phone] Fax # [phone]

## 2023-08-21 DIAGNOSIS — Z23 Encounter for immunization: Secondary | ICD-10-CM | POA: Diagnosis not present

## 2023-08-22 ENCOUNTER — Ambulatory Visit: Payer: Medicare PPO | Attending: Family Medicine

## 2023-08-22 DIAGNOSIS — R2681 Unsteadiness on feet: Secondary | ICD-10-CM | POA: Insufficient documentation

## 2023-08-22 DIAGNOSIS — R2689 Other abnormalities of gait and mobility: Secondary | ICD-10-CM | POA: Insufficient documentation

## 2023-08-22 DIAGNOSIS — M6281 Muscle weakness (generalized): Secondary | ICD-10-CM | POA: Diagnosis not present

## 2023-08-22 NOTE — Therapy (Signed)
OUTPATIENT PHYSICAL THERAPY TREATMENT NOTE   Patient Name: Angela Buchanan MRN: [id number] DOB:09/15/1951, 72 y.o., female Today's Date: 08/22/2023  PCP:  Garth Bigness, MD   REFERRING PROVIDER:  Garth Bigness, MD   Progress Note Reporting Period 06/20/23 to 08/22/23  See note below for Objective Data and Assessment of Progress/Goals.        END OF SESSION:   PT End of Session - 08/22/23 1445     Visit Number 50    Date for PT Re-Evaluation 09/28/23    Authorization Type [employer]    Authorization Time Period 10 visits 10/21-12/27    Authorization - Visit Number 5    Authorization - Number of Visits 10    Progress Note Due on Visit 60    PT Start Time 1402    PT Stop Time 1446    PT Time Calculation (min) 44 min    Equipment Utilized During Treatment Gait belt    Activity Tolerance Patient tolerated treatment well    Behavior During Therapy WFL for tasks assessed/performed                              Past Medical History:  Diagnosis Date   Atypical chest pain 07/19/2021   Bigeminy    no current med.   Breast cancer (HCC) 06/2017   right   Bruises easily    CAD in native artery 10/31/2022   Carotid bruit 11/13/2019   Dental crowns present    History of diverticulitis    Hypertension    states under control with med., has been on med. x 5 yr.   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   Past Surgical History:  Procedure Laterality Date   ABDOMINAL HYSTERECTOMY     partial   BREAST LUMPECTOMY Right 06/22/2017   Malignant   BREAST LUMPECTOMY WITH RADIOACTIVE SEED AND SENTINEL LYMPH NODE BIOPSY Right 06/22/2017   Procedure: RIGHT BREAST LUMPECTOMY WITH RADIOACTIVE SEED AND  RIGHT AXILLARY DEEP SENTINEL LYMPH NODE BIOPSY WITH BLUE DYE INJECTION;  Surgeon: Claud Kelp, MD;  Location: [facility];  Service: General;  Laterality: Right;   BREAST LUMPECTOMY WITH RADIOACTIVE SEED LOCALIZATION Left 06/22/2017   Procedure: LEFT BREAST LUMPECTOMY WITH RADIOACTIVE SEED LOCALIZATION;  Surgeon: Claud Kelp, MD;  Location: [facility];  Service: General;  Laterality: Left;   CATARACT EXTRACTION W/ INTRAOCULAR LENS  IMPLANT, BILATERAL Bilateral    EXCISION OF BREAST BIOPSY Left 06/22/2017   benign   LOOP RECORDER INSERTION N/A 05/16/2022   Procedure: LOOP RECORDER INSERTION;  Surgeon: Lanier Prude, MD;  Location: [facility];  Service: Cardiovascular;  Laterality: N/A;   PERCUTANEOUS PINNING Right 06/22/2022   Procedure: PERCUTANEOUS PINNING OF RIGHT HIP;  Surgeon: Bjorn Pippin, MD;  Location: [facility];  Service: Orthopedics;  Laterality: Right;   TONSILLECTOMY     age 1   Patient Active Problem List   Diagnosis Date Noted   Right spastic hemiplegia (HCC) 12/14/2022   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   CAD in native artery 10/31/2022   Malnutrition of moderate degree 06/22/2022   Closed fracture of femur, neck (HCC) 06/21/2022   Urinary frequency 06/21/2022  Hypokalemia 06/21/2022   Heart murmur 06/21/2022   Hypothyroidism 06/21/2022   Dysphagia 05/17/2022   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 05/11/2020   PVC (premature ventricular contraction) 05/11/2020   Ventricular bigeminy 11/13/2019   Carotid bruit 11/13/2019   Pure hypercholesterolemia 11/13/2019   Malignant neoplasm of upper-outer quadrant of right breast in female, estrogen receptor positive (HCC) 05/22/2017    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   Diplopia 09/17/2014   Dizziness and giddiness 09/17/2014   Essential hypertension 07/22/2014   Chest pain 07/22/2014   Family history of heart disease 07/22/2014    REFERRING DIAG:  R29.6 (ICD-10-CM) - Repeated falls, closed fracture of Rt hip    THERAPY DIAG:  Muscle weakness (generalized)  Other abnormalities of gait and mobility  Unsteadiness on feet  Rationale for Evaluation and Treatment Rehabilitation  PERTINENT HISTORY: hypertension, breast cancer, hyperlipidemia, Lt hip fracture secondary to fall 06/2022, CVA 06/2022   PRECAUTIONS:  Fall, Rt breast cancer with node removal, osteoporosis    SUBJECTIVE:                                                                                                                                                                                      SUBJECTIVE STATEMENT:  I am working on my new OT exercises.  Therapy is helping me.  I can walk longer with my dog.    Pt spouse present during visit.   PAIN:  PAIN:  Are you having pain? No  OBJECTIVE: (objective measures completed at initial evaluation unless otherwise dated)  DIAGNOSTIC FINDINGS: Rt hip fracture and ORIF    COGNITION: Overall cognitive status: Within functional limits for tasks assessed                         SENSATION: WFL  POSTURE: rounded shoulders, forward head, flexed trunk , and weight shift left   PALPATION: NA   LOWER EXTREMITY ROM: Rt hip limited by 50%, hamstrings limited by 50% bil.    LOWER EXTREMITY MMT:   MMT Right eval Right  09/27/22 Left eval Right 10/30/22 Right  01/17/23 Right 05/23/23 Left 10/30/22 Left 01/17/23  Hip flexion 3+ 4- 4- 4- 4 4 4- 4+  Hip extension            Hip abduction 3-  4     4  Hip adduction  Hip internal rotation            Hip external rotation            Knee flexion 3- 4- 3+  4 4  4+  Knee extension 3+ 4 4- 4 painful hip 4+ 4+ 4- 4+  Ankle  dorsiflexion 3+ 4- 4- 4-   4   Ankle plantarflexion            Ankle inversion            Ankle eversion             (Blank rows = not tested)   FUNCTIONAL TESTS:   Eval: Timed up and go (TUG): 1 min, 27 seconds   09/27/22: 5x sit to stand: 29 seconds with use of Lt hand  TUG: 27 seconds   10/30/22: 5x sit to stand: 23 sec use of LT hand     TUG: 26 sec with standard cane   11/20/22: 5x sit to stand: 18.68 seconds    TUG: 19.45 without device    3 minute walk test: 225 feet with a standard cane   01/10/23: 5x sit to stand: 15.77 seconds   01/17/23: TUG with cane: 15.41 seconds with cane    3 min walk test: 313 feet with cane    03/07/23: 3 min walk 275 feet with single point cane   TUG: 16 seconds   03/14/23: 6 min walk test: 723 feet with standard cane    TUG: 14.16 without cane    5x sit to stand: 12.15 seconds without hand   03/28/23 TUG 11 seconds without cane  5x STS 10.6 seconds no Ues  04/18/2023: 6 min walk test:  532 ft with [facility]  05/09/23: 6 min walk test with SPC: 667 feet - RPE 5  05/23/23: TUG: 11 seconds without cane 5x sit to stand: 12.71 seconds  6 min walk test: 763 feet  06/06/23:  5x sit to stand: 11.98 seconds    07/11/23:  5x sit to stand: 11.47 seconds   Short Physical Performance Battery:    Balance:       Side by side stance:    points (1)-1  Semi -tandem:     points (1)-1  Tandem:      points (2)-2    Gait Speed: (63m=9.84 feet) done 2x take the best time:   points                                           6.31 seconds-2 pts     Repeated Chair Stands:      points   (timer stopped when straight on 5th stand)                                             9.69 seconds- 4 pts  Total score=  10/12  points <10/12 predictive of 1 or more mobility limitations and increased risk of mobility disability 6 or less/12 associated with a higher fall rate         07/18/23: 6 min walk test-674 feet           08/22/23:  3 meter test: 4.91 seconds- still 2 pts on  SPPB  TODAY'S TREATMENT:   DATE:  08/22/23 Nustep level 3 x10 minutes with LE only >55 spm PT present throughout to discuss status [employer] carry 5# Lt UE-full length of hallway between gyms.  Rest at end and repeat.  Gait with looking in varying directions up and down the hallway with gait belt Sit to stand with 6# weight: 2x10  Leg Press (seat at 5) 55# 2x10, single leg: Lt 35#, Rt 15# 2x10 bil each- continues to be challenged by weight Weaving in/out of cones gait belt and CGA by PT.  Step to over hurdles with Lt UE support on barre: Lt lead and Rt lead with verbal cues for step height, sidestepping at barre   DATE:  08/15/23 Nustep level 3 x10 minutes with LE only >55 spm PT present throughout to discuss status [employer] carry 5# Lt UE-full length of hallway between gyms.  Rest at end and repeat.  Gait with looking in varying directions up and down the hallway with gait belt Sit to stand with 6# weight: 2x10  Leg Press (seat at 5) 55# 2x10, single leg: Lt 35#, Rt 15# 2x10 bil each- continues to be challenged by weight Weave in/out of cones: gait belt and CGA by PT.  Step to over hurdles with Lt UE support on barre: Lt lead and Rt lead with verbal cues for step height, sidestepping at barre   DATE:  08/08/23 Nustep level 3 x10 minutes with LE only >55 spm PT present throughout to discuss status [employer] carry 5# Lt UE-full length of hallway between gyms.  Rest at end and repeat.  step tap on 6" step 2x10 Rt only to work on clearance Sit to stand with 6# weight: 2x10  Leg Press (seat at 5) 55# 2x10, single leg: Lt 35#, Rt 15# 2x10 bil each- continues to be challenged by weight Weave in/out of cones: gait belt and CGA by PT.  Step to over hurdles with Lt UE support on barre: Lt lead and Rt lead with verbal cues for step height, sidestepping at barre   PATIENT EDUCATION:  Education details: Access Code: [email or web address] Person  educated: Patient Education method: Explanation, Demonstration, and Handouts Education comprehension: verbalized understanding and returned demonstration   HOME EXERCISE PROGRAM: Access Code: [email or web address] URL: [email or web address] Date: 09/27/2022 Prepared by: Tresa Endo  Exercises - Seated Long Arc Quad  - 3 x daily - 7 x weekly - 2 sets - 10 reps - 5 hold - Seated March   - 3 x daily - 7 x weekly - 3 sets - 10 reps - Seated Heel Toe Raises   - 3 x daily - 7 x weekly - 2 sets - 10 reps - Seated Isometric Hip Adduction with Ball  - 3 x daily - 7 x weekly - 2 sets - 10 reps - Sit to Stand with Armchair  - 2 x daily - 7 x weekly - 2 sets - 5-10 reps - Standing Hip Abduction with Counter Support  - 1 x daily - 7 x weekly - 2 sets - 10 reps - Heel Raises with Counter Support  - 2 x daily - 7 x weekly - 2 sets - 10 reps  Patient Education - Walking with a Single [employer] - Modified 4 Point Gait Pattern  ASSESSMENT:   CLINICAL IMPRESSION:     Pt continues to walk regularly with her dog for about 45 minutes.  Pt had increased difficulty clearing her Rt LE with gait and hurdles today and required verbal cues  to flex Rt knee to assist with this. 3 meter walk test is improved today.   Her gait speed is slow and Rt LE weakness secondary to CVA puts her at falls risk due to difficulty clearing the Rt foot when she is fatigued. Pt was challenged by looking in different directions, CGA by PT on gait belt.   Patient tolerated treatment session well and is appropriately challenged by current exercises.  Slow progress due to chronic condition and deficits associated with stroke. Patient will benefit from skilled PT to address the below impairments and improve overall function.    OBJECTIVE IMPAIRMENTS: Abnormal gait, decreased activity tolerance, decreased balance, decreased mobility, difficulty walking, decreased strength, decreased safety awareness, impaired perceived functional ability,  impaired flexibility, postural dysfunction, and pain.    ACTIVITY LIMITATIONS: carrying, lifting, sitting, standing, stairs, transfers, hygiene/grooming, and locomotion level   PARTICIPATION LIMITATIONS: meal prep, cleaning, laundry, driving, shopping, and community activity   PERSONAL FACTORS: Age, Past/current experiences, and 1-2 comorbidities: CVA, falls, Rt hip fracture with ORIF  are also affecting patient's functional outcome.    REHAB POTENTIAL: Good   CLINICAL DECISION MAKING: Evolving/moderate complexity   EVALUATION COMPLEXITY: Moderate     GOALS: Goals reviewed with patient? Yes   SHORT TERM GOALS: Target date: 09/06/2022   Be independent in initial HEP Baseline: Goal status: Goal met 08/14/22   2.  Improve LE strength to perform sit to stand with moderate Lt UE support Baseline:  Goal status: Goal met 08/28/22  3.  Perform TUG in < or = to 60 seconds to reduce falls risk Baseline: 27 seconds (09/27/22) Goal status: MET      LONG TERM GOALS: Target date:  09/28/23   Be independent in advanced HEP Baseline: independent in current HEP and has tolerated progress when appropriate (07/18/23)  Goal status: in progress    2.  Perform TUG in < or = to 13 seconds to reduce falls risk Baseline: 11 seconds without cane (05/23/23) Goal status: met   3.  ambulate > or = to 750 feet in 6 minutes to improve community ambulation and independence  Baseline: this has fluctuated between 674-763 feet- not consistent ( 07/18/23) Goal status: revised     4.  perform 5x sit to stand in < or = to 12 seconds to reduce falls risk  Baseline: 11.47 seconds (07/11/23)  Goal Status: MET  5. Improve balance to walk her dog short distances with independence   Baseline: walking with dog now, slowly (07/18/23)  Goal status: MET      6. Improve gait speed to perform 29m walk in < or = to 3.6 seconds    Baseline: 6.31 seconds (07/18/23)    Goal Status: NEW   PLAN:   PT FREQUENCY:  1x/week   PT DURATION: 10 weeks   PLANNED INTERVENTIONS: Therapeutic exercises, Therapeutic activity, Neuromuscular re-education, Balance training, Gait training, Patient/Family education, Self Care, Joint mobilization, Stair training, Dry Needling, Electrical stimulation, Cryotherapy, Moist heat, Taping, Manual therapy, and Re-evaluation   PLAN FOR NEXT SESSION: Continue to work on gait , strength and balance. Endurance, change of direction.    Lorrene Reid, PT 08/22/23 2:47 PM   [facility] [address], [address] [place], [place] [address] Phone # [phone] Fax [phone]

## 2023-08-23 ENCOUNTER — Encounter: Payer: Medicare PPO | Admitting: Physical Medicine & Rehabilitation

## 2023-08-27 ENCOUNTER — Ambulatory Visit (HOSPITAL_BASED_OUTPATIENT_CLINIC_OR_DEPARTMENT_OTHER): Payer: Medicare PPO | Admitting: Cardiovascular Disease

## 2023-08-27 ENCOUNTER — Encounter (HOSPITAL_BASED_OUTPATIENT_CLINIC_OR_DEPARTMENT_OTHER): Payer: Self-pay | Admitting: Cardiovascular Disease

## 2023-08-27 VITALS — BP 136/70 | HR 54 | Ht 62.0 in | Wt 104.3 lb

## 2023-08-27 DIAGNOSIS — E7800 Pure hypercholesterolemia, unspecified: Secondary | ICD-10-CM | POA: Diagnosis not present

## 2023-08-27 DIAGNOSIS — R072 Precordial pain: Secondary | ICD-10-CM

## 2023-08-27 DIAGNOSIS — Z5181 Encounter for therapeutic drug level monitoring: Secondary | ICD-10-CM

## 2023-08-27 DIAGNOSIS — I493 Ventricular premature depolarization: Secondary | ICD-10-CM | POA: Diagnosis not present

## 2023-08-27 DIAGNOSIS — I639 Cerebral infarction, unspecified: Secondary | ICD-10-CM | POA: Diagnosis not present

## 2023-08-27 DIAGNOSIS — I10 Essential (primary) hypertension: Secondary | ICD-10-CM

## 2023-08-27 DIAGNOSIS — I2510 Atherosclerotic heart disease of native coronary artery without angina pectoris: Secondary | ICD-10-CM | POA: Diagnosis not present

## 2023-08-27 DIAGNOSIS — I491 Atrial premature depolarization: Secondary | ICD-10-CM

## 2023-08-27 MED ORDER — EZETIMIBE 10 MG PO TABS: 10.0000 mg | ORAL_TABLET | Freq: Every day | ORAL | 3 refills | Status: DC

## 2023-08-27 MED ORDER — METOPROLOL TARTRATE 25 MG PO TABS: ORAL_TABLET | ORAL | Status: DC

## 2023-08-27 MED ORDER — ESCITALOPRAM OXALATE 5 MG PO TABS: 5.0000 mg | ORAL_TABLET | Freq: Every day | ORAL | 1 refills | Status: DC

## 2023-08-27 MED ORDER — METOPROLOL TARTRATE 25 MG PO TABS: ORAL_TABLET | ORAL | 3 refills | Status: DC

## 2023-08-27 MED ORDER — LISINOPRIL 40 MG PO TABS: 40.0000 mg | ORAL_TABLET | Freq: Every day | ORAL | 3 refills | Status: DC

## 2023-08-27 MED ORDER — ISOSORBIDE MONONITRATE ER 30 MG PO TB24
30.0000 mg | ORAL_TABLET | Freq: Every day | ORAL | 1 refills | Status: DC
Start: 2023-08-27 — End: 2023-12-19

## 2023-08-27 MED ORDER — AMLODIPINE BESYLATE 10 MG PO TABS: 10.0000 mg | ORAL_TABLET | Freq: Every day | ORAL | 3 refills | Status: DC

## 2023-08-27 NOTE — Patient Instructions (Signed)
Medication Instructions:  START LEXAPRO 5 MG DAILY   INCREASE ISOSORBIDE TO FULL TABLET DAILY   DECREASE METOPROLOL TO 1/2 TABLET DAILY   *If you need a refill on your cardiac medications before your next appointment, please call your pharmacy*  Lab Work: LP/CMET TODAY   If you have labs (blood work) drawn today and your tests are completely normal, you will receive your results only by: MyChart Message (if you have MyChart) OR A paper copy in the mail If you have any lab test that is abnormal or we need to change your treatment, we will call you to review the results.  Testing/Procedures: NONE    Follow-Up: At [facility], you and your health needs are our priority.  As part of our continuing mission to provide you with exceptional heart care, we have created designated Provider Care Teams.  These Care Teams include your primary Cardiologist (physician) and Advanced Practice Providers (APPs -  Physician Assistants and Nurse Practitioners) who all work together to provide you with the care you need, when you need it.  We recommend signing up for the patient portal called "MyChart".  Sign up information is provided on this After Visit Summary.  MyChart is used to connect with patients for Virtual Visits (Telemedicine).  Patients are able to view lab/test results, encounter notes, upcoming appointments, etc.  Non-urgent messages can be sent to your provider as well.   To learn more about what you can do with MyChart, go to [email or web address].    Your next appointment:   6 month(s)  Provider:   Chilton Si, MD or Gillian Shields, NP

## 2023-08-27 NOTE — Progress Notes (Signed)
Cardiology Office Note:  .   Date:  09/09/2023  ID:  Angela Buchanan, DOB [birth year], MRN [id number] PCP: Shon Hale, MD  [facility] Providers Cardiologist:  Chilton Si, MD    History of Present Illness: .   Angela Buchanan is a 72 y.o. female with CVA, Celiac disease, breast cancer s/p lumpectomy and XRT, R subclavian DVT here for follow-up.  In 2017 Angela Buchanan was seen for palpitations.  She wore an ambulatory monitor that revealed sinus rhythm with frequent PVCs.  She also had ventricular bigeminy and trigeminy.  She had a Lexiscan Myoview in 2015 that was negative for ischemia.  It was not gated due to PVCs. She was again seen 11/2019 for the evaluation of palpitations and CV risk assessment.  Her blood pressure is elevated so lisinopril was increased to 40mg .  Lipids were elevated so rosuvastatin was started 11/2019.  She developed severe muscle discomfort. She was asked to reduce the dose and take it every other day.  She wore a 30-day monitor that revealed some PACs and PVCs but was otherwise unremarkable.  She was started on metoprolol.  She had an echo 12/2019 that revealed normal EF and grade 1 diastolic dysfunction.  Carotid Dopplers were normal.  Since starting the metoprolol she has been feeling much better.  She followed up with our pharmacist and her BP was poorly controlled.  HCTZ was increased and metoprolol was reduced.     Angela Buchanan developed hyponatremia and poorly controlled blood pressure. Hydrochlorothiazide was discontinued and she was started on amlodipine.  Her BP was then labile ranging from 100/72 to 174/84.  She was under a lot of stress caring for her husband was stage IV cancer.  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  Sometimes the pain is so excruciating she wants to pull her fingers off.  She had an episode of chest pain and shortness of breath and was referred for [facility] 10/2020 that  revealed LVEF 70% with a fixed perfusion defect at the apex, apical anterior, lateral, and inferior walls not to be artifact.  There is no ischemia.  Metoprolol was increased. She was not taking her HCTZ. She noted her blood pressure was controlled at home but not in the office.  She was switched to pravastatin due to myalgias on rosuvastatin.   Angela Buchanan continued to have joint pain despite stopping her pravastatin one month prior. She complained of occasional sharp chest pain. Home blood pressures were averaging 130s-160s systolic. We restarted pravastatin and added amlodipine 5 mg daily. She had a coronary CTA 08/2021 revealing a coronary calcium score of 14.4 (47th percentile), minimal plaque in the PDA, and mild aortic atherosclerosis. LCx appeared congenitally absent. She was admitted 05/2022 with cryptogenic stroke. Echo revealed LVEF 60-65% with normal diastolic function. An ILR was implanted and no arrhythmias have been detected.  Blood pressure remained uncontrolled and amlodipine was increased to 10 mg.  She follow-up with Gillian Shields, NP 01/2023 and her blood pressure is better controlled.  She is struggling with pill dysphagia.  She followed up with her PCP 03/2023 after awakening from sleep with shortness of breath and stabbing left-sided chest pain.  EKG was unremarkable.  She noted some swelling and shortness of breath.  She was started on Imdur.  She had a coronary CT-A 04/2023 which revealed a calcium score to 56 which was 59th percentile.  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  She had mild plaque in the distal RCA and distal LAD.  Stress test 05/2023 revealed normal perfusion and was low risk  Angela Buchanan presents with a persistent feeling of unwellness and daily struggle. She continues to struggle from her stroke.  She reports experiencing skipped heartbeats, which have been less frequent since starting potassium supplementation. However, she also reports persistent  low energy and occasional lightheadedness, which may be related to a consistently low heart rate in the 50s.  She has been attending physical therapy and reports feeling better during exercise, but also experiences occasional chest discomfort, which is less severe than in the past. The discomfort is noted more when the patient is at rest rather than during physical activity.  In addition to physical symptoms, the patient also reports feeling down and struggling with daily life, particularly in the mornings. She has tried sertraline for mood improvement in the past, but discontinued due to headache side effects. The patient has experienced significant life changes and losses in recent years, which may be contributing to her mood and overall sense of wellbeing.      ROS:  As per HPI  Studies Reviewed: .         Risk Assessment/Calculations:          Physical Exam:    VS:  BP 136/70 (BP Location: Left Arm, Patient Position: Sitting, Cuff Size: Normal)   Pulse (!) 54   Ht 5\' 2"  (1.575 m)   Wt 104 lb 4.8 oz (47.3 kg)   LMP  (LMP Unknown)   SpO2 99%   BMI 19.08 kg/m  , BMI Body mass index is 19.08 kg/m. GENERAL:  Chronically ill-appearing HEENT: Pupils equal round and reactive, fundi not visualized, oral mucosa unremarkable NECK:  No jugular venous distention, waveform within normal limits, carotid upstroke brisk and symmetric, no bruits, no thyromegaly LUNGS:  Clear to auscultation bilaterally HEART:  RRR.  PMI not displaced or sustained,S1 and S2 within normal limits, no S3, no S4, no clicks, no rubs, no murmurs ABD:  Flat, positive bowel sounds normal in frequency in pitch, no bruits, no rebound, no guarding, no midline pulsatile mass, no hepatomegaly, no splenomegaly EXT:  2 plus pulses throughout, no edema, no cyanosis no clubbing SKIN:  No rashes no nodules NEURO:  R sided weakness PSYCH:  Cognitively intact, oriented to person place and time   ASSESSMENT AND PLAN: .    #  CVA: - Continue with clopidogrel, Zetia and pravastatin  # Premature Ventricular Contractions (PVCs) Less frequent since starting potassium supplementation. Loop recorder shows no atrial fibrillation. -Continue potassium supplementation.  # Bradycardia Heart rate consistently in the 50s, possibly contributing to fatigue and occasional lightheadedness. Likely secondary to Metoprolol use. -Reduce Metoprolol to half a pill daily to potentially increase heart rate and improve energy levels.  # Depression Reports feeling down and struggling daily. Currently on Sertraline but discontinued due to headaches. -Start Lexapro to help improve mood. -Consider referral for therapy.  # Angina Occasional chest discomfort, improved since starting Imdur. -Increase Imdur to 30mg  daily.  # Hypertension: BP slightly above goal. Increase Imdur as above.  Reducing metoprolol due to bradycardia.  Continue lisinopril and amlodipine.  General Health Maintenance -Order lipid panel and comprehensive metabolic panel today to check cholesterol and potassium levels.       Dispo:  -Follow-up in six months.  Signed, Chilton Si, MD

## 2023-08-28 ENCOUNTER — Ambulatory Visit: Payer: Medicare PPO | Admitting: Occupational Therapy

## 2023-08-28 DIAGNOSIS — M25611 Stiffness of right shoulder, not elsewhere classified: Secondary | ICD-10-CM | POA: Diagnosis not present

## 2023-08-28 DIAGNOSIS — M6281 Muscle weakness (generalized): Secondary | ICD-10-CM

## 2023-08-28 DIAGNOSIS — I69351 Hemiplegia and hemiparesis following cerebral infarction affecting right dominant side: Secondary | ICD-10-CM | POA: Diagnosis not present

## 2023-08-28 DIAGNOSIS — R278 Other lack of coordination: Secondary | ICD-10-CM

## 2023-08-28 LAB — COMPREHENSIVE METABOLIC PANEL
ALT: 40 [IU]/L — ABNORMAL HIGH (ref 0–32)
AST: 48 [IU]/L — ABNORMAL HIGH (ref 0–40)
Albumin: 4.7 g/dL (ref 3.8–4.8)
Alkaline Phosphatase: 124 [IU]/L — ABNORMAL HIGH (ref 44–121)
BUN/Creatinine Ratio: 13 (ref 12–28)
BUN: 9 mg/dL (ref 8–27)
Bilirubin Total: 0.5 mg/dL (ref 0.0–1.2)
CO2: 24 mmol/L (ref 20–29)
Calcium: 9.7 mg/dL (ref 8.7–10.3)
Chloride: 99 mmol/L (ref 96–106)
Creatinine, Ser: 0.72 mg/dL (ref 0.57–1.00)
Globulin, Total: 2.3 g/dL (ref 1.5–4.5)
Glucose: 96 mg/dL (ref 70–99)
Potassium: 4.1 mmol/L (ref 3.5–5.2)
Sodium: 135 mmol/L (ref 134–144)
Total Protein: 7 g/dL (ref 6.0–8.5)
eGFR: 88 mL/min/{1.73_m2} (ref >59)

## 2023-08-28 LAB — LIPID PANEL
Chol/HDL Ratio: 2 {ratio} (ref 0.0–4.4)
Cholesterol, Total: 163 mg/dL (ref 100–199)
HDL: 83 mg/dL (ref >39)
LDL Chol Calc (NIH): 61 mg/dL (ref 0–99)
Triglycerides: 110 mg/dL (ref 0–149)
VLDL Cholesterol Cal: 19 mg/dL (ref 5–40)

## 2023-08-28 NOTE — Therapy (Signed)
OUTPATIENT OCCUPATIONAL THERAPY NEURO  Treatment Note  Patient Name: Angela Buchanan MRN: [id number] DOB:15-Nov-1950, 72 y.o., female Today's Date: 08/28/2023  PCP: Shon Hale, MD REFERRING PROVIDER: Shon Hale, MD    END OF SESSION:  OT End of Session - 08/28/23 1410     Visit Number 24    Number of Visits 27    Date for OT Re-Evaluation 09/07/23    Authorization Type [employer]    OT Start Time 1404    OT Stop Time 1444    OT Time Calculation (min) 40 min    Behavior During Therapy [facility] for tasks assessed/performed                                    Past Medical History:  Diagnosis Date   Atypical chest pain 07/19/2021   Bigeminy    no current med.   Breast cancer (HCC) 06/2017   right   Bruises easily    CAD in native artery 10/31/2022   Carotid bruit 11/13/2019   Dental crowns present    History of diverticulitis    Hypertension    states under control with med., has been on med. x 5 yr.   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   Past Surgical History:  Procedure Laterality Date   ABDOMINAL HYSTERECTOMY     partial   BREAST LUMPECTOMY Right 06/22/2017   Malignant   BREAST LUMPECTOMY WITH RADIOACTIVE SEED AND SENTINEL LYMPH NODE BIOPSY Right 06/22/2017   Procedure: RIGHT BREAST LUMPECTOMY WITH RADIOACTIVE SEED AND RIGHT AXILLARY DEEP SENTINEL LYMPH NODE BIOPSY WITH BLUE DYE INJECTION;  Surgeon: Claud Kelp, MD;  Location: [facility];  Service: General;  Laterality: Right;   BREAST LUMPECTOMY WITH RADIOACTIVE SEED LOCALIZATION Left 06/22/2017   Procedure: LEFT BREAST LUMPECTOMY WITH RADIOACTIVE SEED LOCALIZATION;  Surgeon: Claud Kelp, MD;   Location: [facility];  Service: General;  Laterality: Left;   CATARACT EXTRACTION W/ INTRAOCULAR LENS  IMPLANT, BILATERAL Bilateral    EXCISION OF BREAST BIOPSY Left 06/22/2017   benign   LOOP RECORDER INSERTION N/A 05/16/2022   Procedure: LOOP RECORDER INSERTION;  Surgeon: Lanier Prude, MD;  Location: [facility];  Service: Cardiovascular;  Laterality: N/A;   PERCUTANEOUS PINNING Right 06/22/2022   Procedure: PERCUTANEOUS PINNING OF RIGHT HIP;  Surgeon: Bjorn Pippin, MD;  Location: [facility];  Service: Orthopedics;  Laterality: Right;   TONSILLECTOMY     age 59   Patient Active Problem List   Diagnosis Date Noted   Right spastic hemiplegia (HCC) 12/14/2022   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 05/11/2020   PVC (  premature ventricular contraction) 05/11/2020   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   Carotid bruit 11/13/2019   Pure hypercholesterolemia 11/13/2019   Malignant neoplasm of upper-outer quadrant of right breast in female, estrogen receptor positive (HCC) 05/22/2017   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   Diplopia 09/17/2014   Dizziness and giddiness 09/17/2014   Essential hypertension 07/22/2014   Chest pain 07/22/2014   Family history of heart disease 07/22/2014    ONSET DATE: 05/13/22 (new referral 04/09/23)  REFERRING DIAG: G83.23 (ICD-10-CM) - 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 Pt accompanied by: self and significant other ([name])  PERTINENT HISTORY: hypertension, breast cancer, hyperlipidemia, Lt hip fracture secondary to fall 06/2022, CVA 05/2022    PRECAUTIONS: Fall, Rt breast cancer with node removal, osteoporosis   WEIGHT BEARING RESTRICTIONS: No  PAIN:  Are you having pain? No  FALLS: Has patient fallen in last 6 months? No  LIVING ENVIRONMENT: Lives with: lives with their spouse Lives in: House/apartment Stairs: Yes: External: 2 steps; on left going up Has following equipment at home: Quad cane large base, Environmental consultant - 2 wheeled, Hemi walker, Wheelchair (manual), shower chair, and Grab bars  PLOF: Independent and Independent with basic ADLs  PATIENT GOALS: to have better use my arm and hand  OBJECTIVE:   HAND DOMINANCE: Right  ADLs:  Transfers/ambulation related to ADLs: Utilizes [facility] for mobility in the community, 80% in the house but will walk without occasionally Eating: husband cuts foods, utilizing built up handles for self-feeding Grooming: needs assistance with washing hair UB Dressing: needs assistance with fastening bra LB Dressing: Mod-max assist for donning pants due to fear of falling, able to don socks and slip on shoes - unable to tie shoes Toileting: Mod I Bathing: Supervision Tub Shower transfers: Supervision, but able to complete transfers with use of grab bars Equipment: Shower seat with back, Grab bars, and Walk in shower  IADLs: Handwriting:  TBA at next session  MOBILITY STATUS: Needs Assist:  utilized [facility] for mobility and Hx of falls  POSTURE COMMENTS:  rounded shoulders and forward head  ACTIVITY TOLERANCE: Activity tolerance: fatigues with increased mobility and effort  FUNCTIONAL OUTCOME MEASURES: Upper Extremity Functional Scale (UEFS): TBD  UPPER EXTREMITY ROM:    Active ROM Right eval Right 05/31/23 Right 06/14/23 Right 07/30/22  Shoulder flexion 87 93 98 94  Shoulder abduction 78 82 86 85  Shoulder adduction      Shoulder extension      Shoulder internal rotation 90% 100% 100%   Shoulder external rotation 50% 75% 95% (requires increased time)   Elbow flexion 132 137 138   Elbow extension -17 -12 -12   Wrist flexion 19 40 48   Wrist extension 35 35 40   Wrist ulnar deviation 15 10 15    Wrist radial deviation 13 15 15    Wrist pronation WNL  WNL   Wrist supination 20 from neutral 65* 65   (Blank rows = not tested)  UPPER EXTREMITY MMT:     MMT Right eval Left eval  Shoulder flexion    Shoulder abduction    Shoulder adduction  Shoulder extension    Shoulder internal rotation    Shoulder external rotation    Middle trapezius    Lower trapezius    Elbow flexion    Elbow extension    Wrist flexion    Wrist extension    Wrist ulnar deviation    Wrist radial deviation    Wrist pronation    Wrist supination    (Blank rows = not tested)    Active ROM Extension (as flexion to loose gross grasp) Right eval Right 05/31/23 Right 06/14/23  Thumb MCP (0-60)     Thumb IP (0-80)     Thumb Radial abd/add (0-55)     Thumb Palmar abd/add (0-45)     Thumb opposition to index     Index MCP (0-90) 0  0  Index PIP (0-100) -30 -50 -22  Index DIP (0-70) -40 -35 -35  Long MCP (0-90) -5 0 0  Long PIP (0-100) -45 -35 -40  Long DIP (0-70) -5 -25 -15  Ring MCP (0-90_ 0 0   Ring PIP (0-100) -40 -40   Ring DIP (0-70) -20 -20   Little MCP (0-90) -7 0   Little PIP (0-100) -25 -25   Little DIP (0-70) -5 0   (Blank rows = not tested)    HAND  FUNCTION: 07/31/23 Grip strength: Right: 6#  05/31/23 Grip strength: Right: 9#  Grip strength: Right: 7,9 = 8# lbs; Left: 30 lbs  COORDINATION: 07/31/23 Box and blocs: Right 15 blocks  05/31/23 9 Hole Peg test: Right: able to pick up one peg with thumb and long finger, however unable to rotate to place into peg board Box and Blocks:  Right 15 blocks  Finger Nose Finger test: slow and deliberate with RUE, but able to touch both nose and finger at this time 9 Hole Peg test: Right: unable to pick up any pegs with RUE  Box and Blocks:  Right 16 blocks, Left 55 blocks  SENSATION: Light touch: WFL Hot/Cold: WFL  EDEMA: no swelling on eval  MUSCLE TONE: RUE: Mild and Hypertonic  COGNITION: Overall cognitive status: Within functional limits for tasks assessed  VISION: Subjective report: pt reports wearing glasses more since CVA Baseline vision: Wears glasses all the time, double vision after cataract removal Visual history: cataracts  OBSERVATIONS: Pt attempting grasp and pinch activities, frequently not incorporating index finger on R with increased flexion away from task.   TODAY'S TREATMENT:                                                           08/28/23 Peg board: engaged in reaching for pegs on mat table to facilitate increased external rotation and shoulder extension.  Then placing large grip pegs into resistive peg board to challenge pinch and manipulation.  Then removing pegs to place into container at head level to facilitate increased shoulder flexion.  Pt demonstrating min trunk rotation to compensate for decreased external rotation.  Pt requiring increased time when picking up pegs due to decreased motor control, but able to pick up and place into table top peg board against resistance.  Pt then removing with good pinch strength and able to reach to ~80-90* shoulder flexion with increased time to release pegs due to decreased finger extension.    08/20/23 Bracing: pt  reports wearing resting hand splint "a lot" because she likes the way it feels.  OT encouraged her to wear just at night time to allow for functional use of RUE during the day.  Pt brought in other handmade splint with built up foam roll in hand - pt reports wearing when watching TV and focusing on grip strength.  OT educated on hand placement to ensure index finger extension around foam roll as well as increased focus on functional grip - on wash cloth, opening/closing lids/crumpling paper. Crumpling paper: engaged in crumpling paper with R hand and with both hands with OT providing cues to open fingers for increased "scrunch"  Clothespins: 2# resistance clothespins with RUE for mid functional reaching and sustained pinch. Pt picking up and placing on horizontal dowel with gross grasp but able to complete.  OT then challenging pt to place on vertical dowel to facilitate increased shoulder flexion.  Pt able to reach to ~80* with resistance clothespins with increased difficulty.  Review HEP: OT removed redundant and/or duplicate exercises and starred most important ones.  Pt demonstrating and verbalizing understanding of each.  OT reviewed functional carryover of each, encouraging not just completion of HEP but to continue to engage in functional tasks that incorporate each exercise.   08/15/23 Cones: engaged in functional reach with picking up cups from mat table at R side and stacking in front on table top.  OT providing cues to visually attend to hand when grasping and releasing to focus on increased opening of hand.  OT downgraded task to skinny cones instead of cups to allow for increased success with grasp. Cards: engaged in picking up cards with focus on [facility] and pinch to pick up, followed by forearm rotation and shoulder flexion to reach to place into matching stacks by suit.  Pt requiring cards flat on table top instead of picking up from stack and utilizing mod sliding of cards to pick up vs picking  up from table top.  Pt demonstrating good shoulder flexion and abduction when placing into matching stacks. Connect 4 pieces:  engaged in picking up and placing connect 4 pieces into grid to further challenge pinch to pick up from table top and then rotate to place into vertical grid.  Pt demonstrating improved success with picking up connect 4 pieces over cards.  OT then increased challenge to having pt stack and [employer] 4 pieces.  Pt requiring increased time to stack, knocking over 2.  OT providing cues to visually attend to RUE to ensure proper grasp/pinch and then pt demonstrating improved success with stacking/unstacking.   PATIENT EDUCATION: Education details: ongoing condition specific education. Person educated: Patient and Spouse Education method: Explanation, Demonstration, and Verbal cues Education comprehension: verbalized understanding and needs further education  HOME EXERCISE PROGRAM: Access Code: [email or web address] URL: [email or web address] Date: 04/23/2023 Prepared by: [facility] - Outpatient  Rehab - [facility]    GOALS: Goals reviewed with patient? Yes   NEW SHORT TERM GOALS: Target date: 07/06/23  Pt and spouse will be independent with advanced HEP for ROM, strengthening, and coordination to D/C at end of this certification period. Baseline: Goal status: IN PROGRESS  2.  Pt will verbalize understanding of additional treatment interventions (ie Vivistim) to facilitate increased functional return of dominant RUE. Baseline: Goal status: IN PROGRESS    NEW LONG TERM GOALS: Target date: 09/07/23  Pt will demonstrate improved functional grasp in RUE by 5# to turn on/off water, open jar, and self-feeding.  Baseline: R: 9#  on 05/31/23 (6# on 07/31/23) Goal status: REVISED  2.  Pt will demonstrate improved UE functional use for ADLs as evidenced by increasing box/ blocks score by 4 blocks with RUE. Baseline: R: 18 blocks on 06/14/23 (15 blocks on  07/17/23) Goal status: IN PROGRESS  3.  Pt will demonstrate improved shoulder flexion as needed for IADLs (to place a dish in the cabinet and/or laundry on a shelf at eye level) Baseline:  Goal status: IN PROGRESS  4.  Pt will demonstrate improvement of finger extension by by 10* TAM of index and 15* TAM of long ringer finger to allow for more functional hand opening.  Baseline: -57 IF on 06/14/23 , -55 LF Goal status: IN PROGRESS  5.  Pt will demonstrate improved handwriting with ability to write 10 item simple grocery list with 90% legibility. Baseline:  Goal status: IN PROGRESS   ASSESSMENT:  CLINICAL IMPRESSION: Pt still limited in shoulder flexion and external rotation, particularly with functional tasks.  Pt tolerating activities this session, reporting and demonstrating increasing difficulty as UE fatigued.  OT providing cues for rest breaks and to attend to quality of movement to not overcompensate with shoulder hike.    PERFORMANCE DEFICITS: in functional skills including ADLs, IADLs, coordination, dexterity, tone, ROM, strength, pain, flexibility, Fine motor control, Gross motor control, mobility, balance, body mechanics, endurance, decreased knowledge of precautions, decreased knowledge of use of DME, and UE functional use and psychosocial skills including environmental adaptation and routines and behaviors.   IMPAIRMENTS: are limiting patient from ADLs and IADLs.   CO-MORBIDITIES: may have co-morbidities  that affects occupational performance. Patient will benefit from skilled OT to address above impairments and improve overall function.  MODIFICATION OR ASSISTANCE TO COMPLETE EVALUATION: Min-Moderate modification of tasks or assist with assess necessary to complete an evaluation.  OT OCCUPATIONAL PROFILE AND HISTORY: Detailed assessment: Review of records and additional review of physical, cognitive, psychosocial history related to current functional performance.  CLINICAL  DECISION MAKING: Moderate - several treatment options, min-mod task modification necessary  REHAB POTENTIAL: Good  EVALUATION COMPLEXITY: Moderate    PLAN:  OT FREQUENCY: 2x/week  OT DURATION: 6 weeks  PLANNED INTERVENTIONS: self care/ADL training, therapeutic exercise, therapeutic activity, neuromuscular re-education, manual therapy, passive range of motion, functional mobility training, splinting, electrical stimulation, ultrasound, compression bandaging, moist heat, cryotherapy, patient/family education, psychosocial skills training, energy conservation, coping strategies training, and DME and/or AE instructions  RECOMMENDED OTHER SERVICES: NA  CONSULTED AND AGREED WITH PLAN OF CARE: Patient and family member/caregiver  PLAN FOR NEXT SESSION: Engage in shoulder flexion in sitting and standing to facilitate increased strength and endurance with ROM,   continue with gross grasp tasks, further address wrist flexion/extension and ulnar/radial deviation, supination, and finger flexion and extension.  Review HEP and prepare for upcoming d/c    Rosalio Loud, OTR/L 08/28/2023, 2:10 PM  [facility] at [facility] [address] [place], [address] [place], [place] [address] Phone # [phone] Fax # [phone]

## 2023-09-03 ENCOUNTER — Ambulatory Visit: Payer: Medicare PPO | Attending: Family Medicine | Admitting: Occupational Therapy

## 2023-09-03 ENCOUNTER — Ambulatory Visit (INDEPENDENT_AMBULATORY_CARE_PROVIDER_SITE_OTHER): Payer: Medicare PPO

## 2023-09-03 DIAGNOSIS — M79601 Pain in right arm: Secondary | ICD-10-CM | POA: Insufficient documentation

## 2023-09-03 DIAGNOSIS — I69351 Hemiplegia and hemiparesis following cerebral infarction affecting right dominant side: Secondary | ICD-10-CM | POA: Insufficient documentation

## 2023-09-03 DIAGNOSIS — R278 Other lack of coordination: Secondary | ICD-10-CM | POA: Insufficient documentation

## 2023-09-03 DIAGNOSIS — M6281 Muscle weakness (generalized): Secondary | ICD-10-CM | POA: Insufficient documentation

## 2023-09-03 DIAGNOSIS — M25611 Stiffness of right shoulder, not elsewhere classified: Secondary | ICD-10-CM | POA: Insufficient documentation

## 2023-09-03 DIAGNOSIS — I639 Cerebral infarction, unspecified: Secondary | ICD-10-CM | POA: Diagnosis not present

## 2023-09-03 LAB — CUP PACEART REMOTE DEVICE CHECK
Date Time Interrogation Session: 20241202003900
Implantable Pulse Generator Implant Date: 20230815
Pulse Gen Serial Number: 183424

## 2023-09-03 NOTE — Therapy (Signed)
OUTPATIENT OCCUPATIONAL THERAPY NEURO  Treatment Note  Patient Name: Angela Buchanan MRN: [id number] DOB:1951-05-15, 72 y.o., female Today's Date: 09/03/2023  PCP: Shon Hale, MD REFERRING PROVIDER: Shon Hale, MD    END OF SESSION:  OT End of Session - 09/03/23 1407     Visit Number 25    Number of Visits 27    Date for OT Re-Evaluation 09/07/23    Authorization Type [employer]    OT Start Time 1403    OT Stop Time 1445    OT Time Calculation (min) 42 min    Behavior During Therapy [facility] for tasks assessed/performed                                     Past Medical History:  Diagnosis Date   Atypical chest pain 07/19/2021   Bigeminy    no current med.   Breast cancer (HCC) 06/2017   right   Bruises easily    CAD in native artery 10/31/2022   Carotid bruit 11/13/2019   Dental crowns present    History of diverticulitis    Hypertension    states under control with med., has been on med. x 5 yr.   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   Past Surgical History:  Procedure Laterality Date   ABDOMINAL HYSTERECTOMY     partial   BREAST LUMPECTOMY Right 06/22/2017   Malignant   BREAST LUMPECTOMY WITH RADIOACTIVE SEED AND SENTINEL LYMPH NODE BIOPSY Right 06/22/2017   Procedure: RIGHT BREAST LUMPECTOMY WITH RADIOACTIVE SEED AND RIGHT AXILLARY DEEP SENTINEL LYMPH NODE BIOPSY WITH BLUE DYE INJECTION;  Surgeon: Claud Kelp, MD;  Location: [facility];  Service: General;  Laterality: Right;   BREAST LUMPECTOMY WITH RADIOACTIVE SEED LOCALIZATION Left 06/22/2017   Procedure: LEFT BREAST LUMPECTOMY WITH RADIOACTIVE SEED LOCALIZATION;  Surgeon: Claud Kelp, MD;   Location: [facility];  Service: General;  Laterality: Left;   CATARACT EXTRACTION W/ INTRAOCULAR LENS  IMPLANT, BILATERAL Bilateral    EXCISION OF BREAST BIOPSY Left 06/22/2017   benign   LOOP RECORDER INSERTION N/A 05/16/2022   Procedure: LOOP RECORDER INSERTION;  Surgeon: Lanier Prude, MD;  Location: [facility];  Service: Cardiovascular;  Laterality: N/A;   PERCUTANEOUS PINNING Right 06/22/2022   Procedure: PERCUTANEOUS PINNING OF RIGHT HIP;  Surgeon: Bjorn Pippin, MD;  Location: [facility];  Service: Orthopedics;  Laterality: Right;   TONSILLECTOMY     age 27   Patient Active Problem List   Diagnosis Date Noted   Right spastic hemiplegia (HCC) 12/14/2022   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 05/11/2020  PVC (premature ventricular contraction) 05/11/2020   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   Carotid bruit 11/13/2019   Pure hypercholesterolemia 11/13/2019   Malignant neoplasm of upper-outer quadrant of right breast in female, estrogen receptor positive (HCC) 05/22/2017   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   Diplopia 09/17/2014   Dizziness and giddiness 09/17/2014   Essential hypertension 07/22/2014   Chest pain 07/22/2014   Family history of heart disease 07/22/2014    ONSET DATE: 05/13/22 (new referral 04/09/23)  REFERRING DIAG: G83.23 (ICD-10-CM) - 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 Pt accompanied by: self and significant other ([name])  PERTINENT HISTORY: hypertension, breast cancer, hyperlipidemia, Lt hip fracture secondary to fall 06/2022, CVA 05/2022    PRECAUTIONS: Fall, Rt breast cancer with node removal, osteoporosis   WEIGHT BEARING RESTRICTIONS: No  PAIN:  Are you having pain? No  FALLS: Has patient fallen in last 6 months? No  LIVING ENVIRONMENT: Lives with: lives with their spouse Lives in: House/apartment Stairs: Yes: External: 2 steps; on left going up Has following equipment at home: Quad cane large base, Environmental consultant - 2 wheeled, Hemi walker, Wheelchair (manual), shower chair, and Grab bars  PLOF: Independent and Independent with basic ADLs  PATIENT GOALS: to have better use my arm and hand  OBJECTIVE:   HAND DOMINANCE: Right  ADLs:  Transfers/ambulation related to ADLs: Utilizes [facility] for mobility in the community, 80% in the house but will walk without occasionally Eating: husband cuts foods, utilizing built up handles for self-feeding Grooming: needs assistance with washing hair UB Dressing: needs assistance with fastening bra LB Dressing: Mod-max assist for donning pants due to fear of falling, able to don socks and slip on shoes - unable to tie shoes Toileting: Mod I Bathing: Supervision Tub Shower transfers: Supervision, but able to complete transfers with use of grab bars Equipment: Shower seat with back, Grab bars, and Walk in shower  IADLs: Handwriting:  TBA at next  session  MOBILITY STATUS: Needs Assist: utilized [facility] for mobility and Hx of falls  POSTURE COMMENTS:  rounded shoulders and forward head  ACTIVITY TOLERANCE: Activity tolerance: fatigues with increased mobility and effort  FUNCTIONAL OUTCOME MEASURES: Upper Extremity Functional Scale (UEFS): TBD  UPPER EXTREMITY ROM:    Active ROM Right eval Right 05/31/23 Right 06/14/23 Right 07/31/23  Shoulder flexion 87 93 98 94  Shoulder abduction 78 82 86 85  Shoulder adduction      Shoulder extension      Shoulder internal rotation 90% 100% 100%   Shoulder external rotation 50% 75% 95% (requires increased time)   Elbow flexion 132 137 138   Elbow extension -17 -12 -12   Wrist flexion 19 40 48   Wrist extension 35 35 40   Wrist ulnar deviation 15 10 15    Wrist radial deviation 13 15 15    Wrist pronation WNL  WNL   Wrist supination 20 from neutral 65* 65   (Blank rows = not tested)    Active ROM Extension (as flexion to loose gross grasp) Right eval Right 05/31/23 Right 06/14/23  Thumb MCP (0-60)     Thumb IP (0-80)  Thumb Radial abd/add (0-55)     Thumb Palmar abd/add (0-45)     Thumb opposition to index     Index MCP (0-90) 0  0  Index PIP (0-100) -30 -50 -22  Index DIP (0-70) -40 -35 -35  Long MCP (0-90) -5 0 0  Long PIP (0-100) -45 -35 -40  Long DIP (0-70) -5 -25 -15  Ring MCP (0-90_ 0 0   Ring PIP (0-100) -40 -40   Ring DIP (0-70) -20 -20   Little MCP (0-90) -7 0   Little PIP (0-100) -25 -25   Little DIP (0-70) -5 0   (Blank rows = not tested)    HAND FUNCTION: 07/31/23 Grip strength: Right: 6#  05/31/23 Grip strength: Right: 9#  Grip strength: Right: 7,9 = 8# lbs; Left: 30 lbs  COORDINATION: 07/31/23 Box and blocs: Right 15 blocks  05/31/23 9 Hole Peg test: Right: able to pick up one peg with thumb and long finger, however unable to rotate to place into peg board Box and Blocks:  Right 15 blocks  Finger Nose Finger test: slow and deliberate with  RUE, but able to touch both nose and finger at this time 9 Hole Peg test: Right: unable to pick up any pegs with RUE  Box and Blocks:  Right 16 blocks, Left 55 blocks  SENSATION: Light touch: WFL Hot/Cold: WFL  EDEMA: no swelling on eval  MUSCLE TONE: RUE: Mild and Hypertonic  COGNITION: Overall cognitive status: Within functional limits for tasks assessed  VISION: Subjective report: pt reports wearing glasses more since CVA Baseline vision: Wears glasses all the time, double vision after cataract removal Visual history: cataracts  OBSERVATIONS: Pt attempting grasp and pinch activities, frequently not incorporating index finger on R with increased flexion away from task.   TODAY'S TREATMENT:                                                           09/03/23 Self-care: engaged in discussion regarding pt current focus and plan with OT.  Pt expressing desire to continue with therapy on a weekly basis and she feels that therapy is continuing to challenge her and provide her with additional exercises and activities to increase functional use of RUE.  Pt reports that she spoke with previous Vivistim pt who was able to answer some questions about Vivistim use and benefits.  OT reiterated progressive benefits of therapy and Vivistim as adjunct to therapy.  Pt reports not wanting to continue to pursue Vivistim at this time.   Handwriting: pt writing name and 6 item grocery list on lined paper with built up foam handle with good legibility, however utilizing larger font on lined paper and significant increased time. Functional reach: attempted to engage in placing cups onto low shelf, however due to short stature and limited shoulder ROM modified task to placing cups on basket on counter top.  Pt able to pick up narrow cones with RUE with minimal finger opening and unable to pick up cups due to wider circumference of cups.  Pt able to pick up from lip of cup and place on basket.  Pt demonstrating  80-90* shoulder flexion, however fatiguing with repetition to 8 cups/cones.    08/28/23 Peg board: engaged in reaching for pegs on mat table to facilitate increased  external rotation and shoulder extension.  Then placing large grip pegs into resistive peg board to challenge pinch and manipulation.  Then removing pegs to place into container at head level to facilitate increased shoulder flexion.  Pt demonstrating min trunk rotation to compensate for decreased external rotation.  Pt requiring increased time when picking up pegs due to decreased motor control, but able to pick up and place into table top peg board against resistance.  Pt then removing with good pinch strength and able to reach to ~80-90* shoulder flexion with increased time to release pegs due to decreased finger extension.    08/20/23 Bracing: pt reports wearing resting hand splint "a lot" because she likes the way it feels.  OT encouraged her to wear just at night time to allow for functional use of RUE during the day.  Pt brought in other handmade splint with built up foam roll in hand - pt reports wearing when watching TV and focusing on grip strength.  OT educated on hand placement to ensure index finger extension around foam roll as well as increased focus on functional grip - on wash cloth, opening/closing lids/crumpling paper. Crumpling paper: engaged in crumpling paper with R hand and with both hands with OT providing cues to open fingers for increased "scrunch"  Clothespins: 2# resistance clothespins with RUE for mid functional reaching and sustained pinch. Pt picking up and placing on horizontal dowel with gross grasp but able to complete.  OT then challenging pt to place on vertical dowel to facilitate increased shoulder flexion.  Pt able to reach to ~80* with resistance clothespins with increased difficulty.  Review HEP: OT removed redundant and/or duplicate exercises and starred most important ones.  Pt demonstrating and  verbalizing understanding of each.  OT reviewed functional carryover of each, encouraging not just completion of HEP but to continue to engage in functional tasks that incorporate each exercise.   PATIENT EDUCATION: Education details: ongoing condition specific education. Person educated: Patient and Spouse Education method: Explanation, Demonstration, and Verbal cues Education comprehension: verbalized understanding and needs further education  HOME EXERCISE PROGRAM: Access Code: [email or web address] URL: [email or web address] Date: 04/23/2023 Prepared by: [facility] - Outpatient  Rehab - [facility]    GOALS: Goals reviewed with patient? Yes   NEW SHORT TERM GOALS: Target date: 07/06/23  Pt and spouse will be independent with advanced HEP for ROM, strengthening, and coordination to D/C at end of this certification period. Baseline: Goal status: IN PROGRESS  2.  Pt will verbalize understanding of additional treatment interventions (ie Vivistim) to facilitate increased functional return of dominant RUE. Baseline: Goal status: IN PROGRESS    NEW LONG TERM GOALS: Target date: 09/07/23  Pt will demonstrate improved functional grasp in RUE by 5# to turn on/off water, open jar, and self-feeding.  Baseline: R: 9# on 05/31/23 (6# on 07/31/23) Goal status: REVISED  2.  Pt will demonstrate improved UE functional use for ADLs as evidenced by increasing box/ blocks score by 4 blocks with RUE. Baseline: R: 18 blocks on 06/14/23 (15 blocks on 07/17/23) Goal status: IN PROGRESS  3.  Pt will demonstrate improved shoulder flexion as needed for IADLs (to place a dish in the cabinet and/or laundry on a shelf at eye level) Baseline:  Goal status: IN PROGRESS  4.  Pt will demonstrate improvement of finger extension by by 10* TAM of index and 15* TAM of long ringer finger to allow for more functional hand opening.  Baseline: -57 IF on 06/14/23 , -  55 LF Goal status: IN PROGRESS  5.  Pt  will demonstrate improved handwriting with ability to write 10 item simple grocery list with 90% legibility. Baseline:  Goal status: IN PROGRESS   ASSESSMENT:  CLINICAL IMPRESSION: Pt still limited in shoulder flexion and internal/external rotation as needed with functional reach to place cups onto shelf.  Pt demonstrating improved compensatory strategies and adaptations to pick up cups by lip of cup when unable to pick up around edge of cup due to decreased hand opening.  Pt demonstrating increased functional use of RUE with opening chap stick lid and with handwriting this session.  Pt will continue to benefit from occupational therapy services to continue to address decreased ROM, strength, and functional use of dominant RUE.   PERFORMANCE DEFICITS: in functional skills including ADLs, IADLs, coordination, dexterity, tone, ROM, strength, pain, flexibility, Fine motor control, Gross motor control, mobility, balance, body mechanics, endurance, decreased knowledge of precautions, decreased knowledge of use of DME, and UE functional use and psychosocial skills including environmental adaptation and routines and behaviors.   IMPAIRMENTS: are limiting patient from ADLs and IADLs.   CO-MORBIDITIES: may have co-morbidities  that affects occupational performance. Patient will benefit from skilled OT to address above impairments and improve overall function.  MODIFICATION OR ASSISTANCE TO COMPLETE EVALUATION: Min-Moderate modification of tasks or assist with assess necessary to complete an evaluation.  OT OCCUPATIONAL PROFILE AND HISTORY: Detailed assessment: Review of records and additional review of physical, cognitive, psychosocial history related to current functional performance.  CLINICAL DECISION MAKING: Moderate - several treatment options, min-mod task modification necessary  REHAB POTENTIAL: Good  EVALUATION COMPLEXITY: Moderate    PLAN:  OT FREQUENCY: 2x/week  OT DURATION: 6  weeks  PLANNED INTERVENTIONS: self care/ADL training, therapeutic exercise, therapeutic activity, neuromuscular re-education, manual therapy, passive range of motion, functional mobility training, splinting, electrical stimulation, ultrasound, compression bandaging, moist heat, cryotherapy, patient/family education, psychosocial skills training, energy conservation, coping strategies training, and DME and/or AE instructions  RECOMMENDED OTHER SERVICES: NA  CONSULTED AND AGREED WITH PLAN OF CARE: Patient and family member/caregiver  PLAN FOR NEXT SESSION: Engage in shoulder flexion in sitting and standing to facilitate increased strength and endurance with ROM,   continue with gross grasp tasks, further address wrist flexion/extension and ulnar/radial deviation, supination, and finger flexion and extension.  Review HEP and prepare for upcoming d/c    Rosalio Loud, OTR/L 09/03/2023, 2:09 PM  [facility] at [facility] [address] [place], [address] [place], [place] [address] Phone # [phone] Fax # [phone]

## 2023-09-05 ENCOUNTER — Ambulatory Visit: Payer: Medicare PPO | Attending: Family Medicine

## 2023-09-05 DIAGNOSIS — M6281 Muscle weakness (generalized): Secondary | ICD-10-CM | POA: Diagnosis not present

## 2023-09-05 DIAGNOSIS — R2689 Other abnormalities of gait and mobility: Secondary | ICD-10-CM | POA: Insufficient documentation

## 2023-09-05 DIAGNOSIS — R293 Abnormal posture: Secondary | ICD-10-CM | POA: Diagnosis not present

## 2023-09-05 DIAGNOSIS — R2681 Unsteadiness on feet: Secondary | ICD-10-CM | POA: Diagnosis not present

## 2023-09-05 NOTE — Therapy (Signed)
OUTPATIENT PHYSICAL THERAPY TREATMENT NOTE   Patient Name: Angela Buchanan MRN: [id number] DOB:[birth year], 72 y.o., female Today's Date: 09/05/2023  PCP:  Garth Bigness, MD   REFERRING PROVIDER:  Garth Bigness, MD        END OF SESSION:   PT End of Session - 09/05/23 1446     Visit Number 51    Date for PT Re-Evaluation 09/28/23    Authorization Type [employer]    Authorization Time Period 10 visits 10/21-12/27    Authorization - Visit Number 6    Authorization - Number of Visits 10    Progress Note Due on Visit 60    PT Start Time 1401    PT Stop Time 1447    PT Time Calculation (min) 46 min    Equipment Utilized During Treatment Gait belt    Activity Tolerance Patient tolerated treatment well    Behavior During Therapy WFL for tasks assessed/performed                               Past Medical History:  Diagnosis Date   Atypical chest pain 07/19/2021   Bigeminy    no current med.   Breast cancer (HCC) 06/2017   right   Bruises easily    CAD in native artery 10/31/2022   Carotid bruit 11/13/2019   Dental crowns present    History of diverticulitis    Hypertension    states under control with med., has been on med. x 5 yr.   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   Past Surgical History:  Procedure Laterality Date   ABDOMINAL HYSTERECTOMY     partial   BREAST LUMPECTOMY Right 06/22/2017   Malignant   BREAST LUMPECTOMY WITH RADIOACTIVE SEED AND SENTINEL LYMPH NODE BIOPSY Right 06/22/2017   Procedure: RIGHT BREAST LUMPECTOMY WITH RADIOACTIVE SEED AND RIGHT AXILLARY DEEP SENTINEL LYMPH NODE BIOPSY WITH BLUE DYE INJECTION;  Surgeon: Claud Kelp, MD;  Location: [facility];  Service: General;  Laterality: Right;   BREAST LUMPECTOMY WITH RADIOACTIVE SEED LOCALIZATION Left 06/22/2017   Procedure: LEFT BREAST LUMPECTOMY WITH RADIOACTIVE SEED LOCALIZATION;  Surgeon: Claud Kelp, MD;  Location: [facility];  Service: General;  Laterality: Left;   CATARACT EXTRACTION W/ INTRAOCULAR LENS  IMPLANT, BILATERAL Bilateral    EXCISION OF BREAST BIOPSY Left 06/22/2017   benign   LOOP RECORDER INSERTION N/A 05/16/2022   Procedure: LOOP RECORDER INSERTION;  Surgeon: Lanier Prude, MD;  Location: [facility];  Service: Cardiovascular;  Laterality: N/A;   PERCUTANEOUS PINNING Right 06/22/2022   Procedure: PERCUTANEOUS PINNING OF RIGHT HIP;  Surgeon: Bjorn Pippin, MD;  Location: [facility];  Service: Orthopedics;  Laterality: Right;   TONSILLECTOMY     age 88   Patient Active Problem List   Diagnosis Date Noted   Right spastic hemiplegia (HCC) 12/14/2022   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   CAD in native artery 10/31/2022   Malnutrition of moderate degree 06/22/2022   Closed fracture of femur, neck (HCC) 06/21/2022   Urinary frequency 06/21/2022   Hypokalemia 06/21/2022   Heart murmur 06/21/2022   Hypothyroidism 06/21/2022   Dysphagia 05/17/2022  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 05/11/2020   PVC (premature ventricular contraction) 05/11/2020   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   Carotid bruit 11/13/2019   Pure hypercholesterolemia 11/13/2019   Malignant neoplasm of upper-outer quadrant of right breast in female, estrogen receptor positive (HCC) 05/22/2017   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   Diplopia 09/17/2014   Dizziness and giddiness 09/17/2014   Essential  hypertension 07/22/2014   Chest pain 07/22/2014   Family history of heart disease 07/22/2014    REFERRING DIAG:  R29.6 (ICD-10-CM) - Repeated falls, closed fracture of Rt hip    THERAPY DIAG:  Muscle weakness (generalized)  Unsteadiness on feet  Other abnormalities of gait and mobility  Abnormal posture  Rationale for Evaluation and Treatment Rehabilitation  PERTINENT HISTORY: hypertension, breast cancer, hyperlipidemia, Lt hip fracture secondary to fall 06/2022, CVA 06/2022   PRECAUTIONS:  Fall, Rt breast cancer with node removal, osteoporosis    SUBJECTIVE:                                                                                                                                                                                      SUBJECTIVE STATEMENT:  I'm walking when I can.  It is cold so I'm not able walk as much.   Pt spouse present during visit.   PAIN:  PAIN:  Are you having pain? No  OBJECTIVE: (objective measures completed at initial evaluation unless otherwise dated)  DIAGNOSTIC FINDINGS: Rt hip fracture and ORIF    COGNITION: Overall cognitive status: Within functional limits for tasks assessed                         SENSATION: WFL  POSTURE: rounded shoulders, forward head, flexed trunk , and weight shift left   PALPATION: NA   LOWER EXTREMITY ROM: Rt hip limited by 50%, hamstrings limited by 50% bil.    LOWER EXTREMITY MMT:   MMT Right eval Right  09/27/22 Left eval Right 10/30/22 Right  01/17/23 Right 05/23/23 Left 10/30/22 Left 01/17/23  Hip flexion 3+ 4- 4- 4- 4 4 4- 4+  Hip extension            Hip abduction 3-  4     4  Hip adduction            Hip internal rotation            Hip external rotation  Knee flexion 3- 4- 3+  4 4  4+  Knee extension 3+ 4 4- 4 painful hip 4+ 4+ 4- 4+  Ankle dorsiflexion 3+ 4- 4- 4-   4   Ankle plantarflexion            Ankle inversion            Ankle eversion             (Blank rows =  not tested)   FUNCTIONAL TESTS:   Eval: Timed up and go (TUG): 1 min, 27 seconds   09/27/22: 5x sit to stand: 29 seconds with use of Lt hand  TUG: 27 seconds   10/30/22: 5x sit to stand: 23 sec use of LT hand     TUG: 26 sec with standard cane   11/20/22: 5x sit to stand: 18.68 seconds    TUG: 19.45 without device    3 minute walk test: 225 feet with a standard cane   01/10/23: 5x sit to stand: 15.77 seconds   01/17/23: TUG with cane: 15.41 seconds with cane    3 min walk test: 313 feet with cane    03/07/23: 3 min walk 275 feet with single point cane   TUG: 16 seconds   03/14/23: 6 min walk test: 723 feet with standard cane    TUG: 14.16 without cane    5x sit to stand: 12.15 seconds without hand   03/28/23 TUG 11 seconds without cane  5x STS 10.6 seconds no Ues  04/18/2023: 6 min walk test:  532 ft with [facility]  05/09/23: 6 min walk test with SPC: 667 feet - RPE 5  05/23/23: TUG: 11 seconds without cane 5x sit to stand: 12.71 seconds  6 min walk test: 763 feet  06/06/23:  5x sit to stand: 11.98 seconds    07/11/23:  5x sit to stand: 11.47 seconds   Short Physical Performance Battery:    Balance:       Side by side stance:    points (1)-1  Semi -tandem:     points (1)-1  Tandem:      points (2)-2    Gait Speed: (44m=9.84 feet) done 2x take the best time:   points                                           6.31 seconds-2 pts     Repeated Chair Stands:      points   (timer stopped when straight on 5th stand)                                             9.69 seconds- 4 pts  Total score=  10/12  points <10/12 predictive of 1 or more mobility limitations and increased risk of mobility disability 6 or less/12 associated with a higher fall rate         07/18/23: 6 min walk test-674 feet           08/22/23:  3 meter test: 4.91 seconds- still 2 pts on SPPB  TODAY'S TREATMENT:   DATE:  09/05/23 Nustep level 3  x10 minutes with LE only >55 spm PT present throughout to discuss status [employer] carry 5# Lt UE-full length of hallway between gyms.  Rest at end and repeat.  Gait with looking in varying directions up and down the hallway with gait belt Sit to stand with 6# weight: 2x10  Rt hip flexor stretch on step 3x20 seconds  Leg Press (seat at 5) 55# 2x10, single leg: Lt 35#, Rt 15# 2x10 bil each- continues to be challenged by weight Weaving in/out of cones gait belt and CGA by PT.  Step to over hurdles with Lt UE support on barre: Lt lead and Rt lead with verbal cues for step height to clear hurdle sidestepping at barre with single arm support   DATE:  08/22/23 Nustep level 3 x10 minutes with LE only >55 spm PT present throughout to discuss status [employer] carry 5# Lt UE-full length of hallway between gyms.  Rest at end and repeat.  Gait with looking in varying directions up and down the hallway with gait belt Sit to stand with 6# weight: 2x10  Leg Press (seat at 5) 55# 2x10, single leg: Lt 35#, Rt 15# 2x10 bil each- continues to be challenged by weight Weaving in/out of cones gait belt and CGA by PT.  Step to over hurdles with Lt UE support on barre: Lt lead and Rt lead with verbal cues for step height, sidestepping at barre   DATE:  08/15/23 Nustep level 3 x10 minutes with LE only >55 spm PT present throughout to discuss status [employer] carry 5# Lt UE-full length of hallway between gyms.  Rest at end and repeat.  Gait with looking in varying directions up and down the hallway with gait belt Sit to stand with 6# weight: 2x10  Leg Press (seat at 5) 55# 2x10, single leg: Lt 35#, Rt 15# 2x10 bil each- continues to be challenged by weight Weave in/out of cones: gait belt and CGA by PT.  Step to over hurdles with Lt UE support on barre: Lt lead and Rt lead with verbal cues for step height, sidestepping at barre   PATIENT EDUCATION:  Education details: Access Code: [email or web address] Person educated:  Patient Education method: Explanation, Demonstration, and Handouts Education comprehension: verbalized understanding and returned demonstration   HOME EXERCISE PROGRAM: Access Code: [email or web address] URL: [email or web address] Date: 09/27/2022 Prepared by: Tresa Endo  Exercises - Seated Long Arc Quad  - 3 x daily - 7 x weekly - 2 sets - 10 reps - 5 hold - Seated March   - 3 x daily - 7 x weekly - 3 sets - 10 reps - Seated Heel Toe Raises   - 3 x daily - 7 x weekly - 2 sets - 10 reps - Seated Isometric Hip Adduction with Ball  - 3 x daily - 7 x weekly - 2 sets - 10 reps - Sit to Stand with Armchair  - 2 x daily - 7 x weekly - 2 sets - 5-10 reps - Standing Hip Abduction with Counter Support  - 1 x daily - 7 x weekly - 2 sets - 10 reps - Heel Raises with Counter Support  - 2 x daily - 7 x weekly - 2 sets - 10 reps  Patient Education - Walking with a Single [employer] - Modified 4 Point Gait Pattern  ASSESSMENT:   CLINICAL IMPRESSION:     Pt continues to walk regularly with her dog for about 45  minutes as able.  Pt reports Rt anterior hip pain and PT showed her how to stretch this at home.  She plans to follow-up with Dr Everardo Pacific to make sure her surgical pins are intact.  Her gait speed is slow and Rt LE weakness secondary to CVA puts her at falls risk due to difficulty clearing the Rt foot when she is fatigued.   Patient tolerated treatment session well and is appropriately challenged by current exercises.  Slow progress due to chronic condition and deficits associated with stroke. Patient will benefit from skilled PT to address the below impairments and improve overall function.   OBJECTIVE IMPAIRMENTS: Abnormal gait, decreased activity tolerance, decreased balance, decreased mobility, difficulty walking, decreased strength, decreased safety awareness, impaired perceived functional ability, impaired flexibility, postural dysfunction, and pain.    ACTIVITY LIMITATIONS: carrying, lifting,  sitting, standing, stairs, transfers, hygiene/grooming, and locomotion level   PARTICIPATION LIMITATIONS: meal prep, cleaning, laundry, driving, shopping, and community activity   PERSONAL FACTORS: Age, Past/current experiences, and 1-2 comorbidities: CVA, falls, Rt hip fracture with ORIF  are also affecting patient's functional outcome.    REHAB POTENTIAL: Good   CLINICAL DECISION MAKING: Evolving/moderate complexity   EVALUATION COMPLEXITY: Moderate     GOALS: Goals reviewed with patient? Yes   SHORT TERM GOALS: Target date: 09/06/2022   Be independent in initial HEP Baseline: Goal status: Goal met 08/14/22   2.  Improve LE strength to perform sit to stand with moderate Lt UE support Baseline:  Goal status: Goal met 08/28/22  3.  Perform TUG in < or = to 60 seconds to reduce falls risk Baseline: 27 seconds (09/27/22) Goal status: MET      LONG TERM GOALS: Target date:  09/28/23   Be independent in advanced HEP Baseline: independent in current HEP and has tolerated progress when appropriate (07/18/23)  Goal status: in progress    2.  Perform TUG in < or = to 13 seconds to reduce falls risk Baseline: 11 seconds without cane (05/23/23) Goal status: met   3.  ambulate > or = to 750 feet in 6 minutes to improve community ambulation and independence  Baseline: this has fluctuated between 674-763 feet- not consistent ( 07/18/23) Goal status: revised     4.  perform 5x sit to stand in < or = to 12 seconds to reduce falls risk  Baseline: 11.47 seconds (07/11/23)  Goal Status: MET  5. Improve balance to walk her dog short distances with independence   Baseline: walking with dog now, slowly (07/18/23)  Goal status: MET      6. Improve gait speed to perform 66m walk in < or = to 3.6 seconds    Baseline: 6.31 seconds (07/18/23)    Goal Status: NEW   PLAN:   PT FREQUENCY: 1x/week   PT DURATION: 10 weeks   PLANNED INTERVENTIONS: Therapeutic exercises, Therapeutic  activity, Neuromuscular re-education, Balance training, Gait training, Patient/Family education, Self Care, Joint mobilization, Stair training, Dry Needling, Electrical stimulation, Cryotherapy, Moist heat, Taping, Manual therapy, and Re-evaluation   PLAN FOR NEXT SESSION: Continue to work on gait , strength and balance. Endurance, change of direction.    Lorrene Reid, PT 09/05/23 2:47 PM   [facility] [address], [address] [place], [place] [address] Phone # [phone] Fax [phone]

## 2023-09-07 ENCOUNTER — Telehealth: Payer: Self-pay | Admitting: Cardiovascular Disease

## 2023-09-07 DIAGNOSIS — R7989 Other specified abnormal findings of blood chemistry: Secondary | ICD-10-CM

## 2023-09-07 MED ORDER — PRAVASTATIN SODIUM 20 MG PO TABS: 20.0000 mg | ORAL_TABLET | Freq: Every day | ORAL | 3 refills | Status: DC

## 2023-09-07 NOTE — Telephone Encounter (Signed)
-----   Message from Alver Sorrow sent at 09/07/2023  5:15 PM EST ----- Cholesterol numbers look fantastic! Normal kidneys, electrolytes.   Liver enzymes elevated. Ensure avoiding alcohol, acetaminophen (tylenol). Would be unusual for this to be due to Pravastatin as she has been on it for >1 year. Out of abundance of caution, reduce Pravastatin to 20mg  daily with repeat LFT in 4 weeks.   Would also recommend follow up with PCP regarding elevated liver enzymes. CCing PCP as Lorain Childes.

## 2023-09-07 NOTE — Telephone Encounter (Signed)
 Patient is requesting call back in regards to results.

## 2023-09-07 NOTE — Telephone Encounter (Signed)
Advised patient of lab results  Patient she does not drink and has been using Advil Printed and mailed  Stated she would follow up with PCP

## 2023-09-09 ENCOUNTER — Encounter (HOSPITAL_BASED_OUTPATIENT_CLINIC_OR_DEPARTMENT_OTHER): Payer: Self-pay | Admitting: Cardiovascular Disease

## 2023-09-10 ENCOUNTER — Ambulatory Visit: Payer: Medicare PPO | Admitting: Occupational Therapy

## 2023-09-10 DIAGNOSIS — M25611 Stiffness of right shoulder, not elsewhere classified: Secondary | ICD-10-CM | POA: Diagnosis not present

## 2023-09-10 DIAGNOSIS — M79601 Pain in right arm: Secondary | ICD-10-CM

## 2023-09-10 DIAGNOSIS — I69351 Hemiplegia and hemiparesis following cerebral infarction affecting right dominant side: Secondary | ICD-10-CM

## 2023-09-10 DIAGNOSIS — M6281 Muscle weakness (generalized): Secondary | ICD-10-CM

## 2023-09-10 DIAGNOSIS — R278 Other lack of coordination: Secondary | ICD-10-CM | POA: Diagnosis not present

## 2023-09-10 NOTE — Therapy (Addendum)
 OUTPATIENT OCCUPATIONAL THERAPY NEURO  Treatment Note  Patient Name: Angela Buchanan MRN: [id number] DOB:1951/06/23, 72 y.o., female Today's Date: 09/10/2023  PCP: Shon Hale, MD REFERRING PROVIDER: Shon Hale, MD    END OF SESSION:  OT End of Session - 09/10/23 1408     Visit Number 26    Number of Visits 32    Date for OT Re-Evaluation 10/19/23    Authorization Type [employer]    OT Start Time 1404    OT Stop Time 1446    OT Time Calculation (min) 42 min    Behavior During Therapy [facility] for tasks assessed/performed                                      Past Medical History:  Diagnosis Date   Atypical chest pain 07/19/2021   Bigeminy    no current med.   Breast cancer (HCC) 06/2017   right   Bruises easily    CAD in native artery 10/31/2022   Carotid bruit 11/13/2019   Dental crowns present    History of diverticulitis    Hypertension    states under control with med., has been on med. x 5 yr.   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   Past Surgical History:  Procedure Laterality Date   ABDOMINAL HYSTERECTOMY     partial   BREAST LUMPECTOMY Right 06/22/2017   Malignant   BREAST LUMPECTOMY WITH RADIOACTIVE SEED AND SENTINEL LYMPH NODE BIOPSY Right 06/22/2017   Procedure: RIGHT BREAST LUMPECTOMY WITH RADIOACTIVE SEED AND RIGHT AXILLARY DEEP SENTINEL LYMPH NODE BIOPSY WITH BLUE DYE INJECTION;  Surgeon: Claud Kelp, MD;  Location: [facility];  Service: General;  Laterality: Right;   BREAST LUMPECTOMY WITH RADIOACTIVE SEED LOCALIZATION Left 06/22/2017   Procedure: LEFT BREAST LUMPECTOMY WITH RADIOACTIVE SEED LOCALIZATION;  Surgeon: Claud Kelp,  MD;  Location: [facility];  Service: General;  Laterality: Left;   CATARACT EXTRACTION W/ INTRAOCULAR LENS  IMPLANT, BILATERAL Bilateral    EXCISION OF BREAST BIOPSY Left 06/22/2017   benign   LOOP RECORDER INSERTION N/A 05/16/2022   Procedure: LOOP RECORDER INSERTION;  Surgeon: Lanier Prude, MD;  Location: [facility];  Service: Cardiovascular;  Laterality: N/A;   PERCUTANEOUS PINNING Right 06/22/2022   Procedure: PERCUTANEOUS PINNING OF RIGHT HIP;  Surgeon: Bjorn Pippin, MD;  Location: [facility];  Service: Orthopedics;  Laterality: Right;   TONSILLECTOMY     age 70   Patient Active Problem List   Diagnosis Date Noted   Right spastic hemiplegia (HCC) 12/14/2022   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 05/11/2020  PVC (premature ventricular contraction) 05/11/2020   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   Carotid bruit 11/13/2019   Pure hypercholesterolemia 11/13/2019   Malignant neoplasm of upper-outer quadrant of right breast in female, estrogen receptor positive (HCC) 05/22/2017   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   Diplopia 09/17/2014   Dizziness and giddiness 09/17/2014   Essential hypertension 07/22/2014   Chest pain 07/22/2014   Family history of heart disease 07/22/2014    ONSET DATE: 05/13/22 (new referral 04/09/23)  REFERRING DIAG: G83.23 (ICD-10-CM) - 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 Pt accompanied by: self and significant other ([name])  PERTINENT HISTORY: hypertension, breast cancer, hyperlipidemia, Lt hip fracture secondary to fall 06/2022, CVA 05/2022    PRECAUTIONS: Fall, Rt breast cancer with node removal, osteoporosis   WEIGHT BEARING RESTRICTIONS: No  PAIN:  Are you having pain? Yes: NPRS scale: 6/10 Pain location: R shoulder Pain description: sore, stiff, aching Aggravating factors: reaching and exercises Relieving factors: rest, repositioning  FALLS: Has patient fallen in last 6 months? No  LIVING ENVIRONMENT: Lives with: lives with their spouse Lives in: House/apartment Stairs: Yes: External: 2 steps; on left going up Has following equipment at home: Quad cane large base, Environmental consultant - 2 wheeled, Hemi walker, Wheelchair (manual), shower chair, and Grab bars  PLOF: Independent and Independent with basic ADLs  PATIENT GOALS: to have better use my arm and hand  OBJECTIVE:   HAND DOMINANCE: Right  ADLs:  Transfers/ambulation related to ADLs: Utilizes [facility] for mobility in the community, 80% in the house but will walk without occasionally Eating: husband cuts foods, utilizing built up handles for self-feeding Grooming: needs assistance with washing hair UB Dressing: needs assistance with fastening bra LB Dressing: Mod-max assist for donning pants due to fear of falling, able to don socks and slip on shoes - unable to tie shoes Toileting: Mod I Bathing: Supervision Tub Shower  transfers: Supervision, but able to complete transfers with use of grab bars Equipment: Shower seat with back, Grab bars, and Walk in shower  IADLs: Handwriting:  TBA at next session  MOBILITY STATUS: Needs Assist: utilized [facility] for mobility and Hx of falls  POSTURE COMMENTS:  rounded shoulders and forward head  ACTIVITY TOLERANCE: Activity tolerance: fatigues with increased mobility and effort  FUNCTIONAL OUTCOME MEASURES: Upper Extremity Functional Scale (UEFS): TBD  UPPER EXTREMITY ROM:    Active ROM Right eval Right 05/31/23 Right 06/14/23 Right 07/31/23 Right 09/10/23  Shoulder flexion 87 93 98 94 95  Shoulder abduction 78 82 86 85 85 (difficult to assess due to loose fitting clothing)  Shoulder adduction       Shoulder extension       Shoulder internal rotation 90% 100% 100%  100%  Shoulder external rotation 50% 75% 95% (requires increased time)  90%  Elbow flexion 132 137 138  134  Elbow extension -17 -12 -12  -15  Wrist flexion 19 40 48  44  Wrist extension 35 35 40  40  Wrist ulnar deviation 15 10 15     Wrist radial deviation 13 15 15  Wrist pronation WNL  WNL  WNL (slower)  Wrist supination 20 from neutral 65* 65  65  (Blank rows = not tested)    Active ROM Extension (as flexion to loose gross grasp) Right eval Right 05/31/23 Right 06/14/23 Right 09/10/23  Thumb MCP (0-60)      Thumb IP (0-80)      Thumb Radial abd/add (0-55)      Thumb Palmar abd/add (0-45)      Thumb opposition to index      Index MCP (0-90) 0  0   Index PIP (0-100) -30 -50 -22 Unable to assess due   Index DIP (0-70) -40 -35 -35 To increased   Long MCP (0-90) -5 0 0 Tone with difficulty  Long PIP (0-100) -45 -35 -40 With active open/close  Long DIP (0-70) -5 -25 -15   Ring MCP (0-90_ 0 0    Ring PIP (0-100) -40 -40    Ring DIP (0-70) -20 -20    Little MCP (0-90) -7 0    Little PIP (0-100) -25 -25    Little DIP (0-70) -5 0    (Blank rows = not tested)    HAND  FUNCTION: 09/10/23 Grip strength: 5#  07/31/23 Grip strength: Right: 6#  05/31/23 Grip strength: Right: 9#  Grip strength: Right: 7,9 = 8# lbs; Left: 30 lbs  COORDINATION: 09/10/23 Box and blocks: Right 14 blocks  07/31/23 Box and blocs: Right 15 blocks  05/31/23 9 Hole Peg test: Right: able to pick up one peg with thumb and long finger, however unable to rotate to place into peg board Box and Blocks:  Right 15 blocks  Finger Nose Finger test: slow and deliberate with RUE, but able to touch both nose and finger at this time 9 Hole Peg test: Right: unable to pick up any pegs with RUE  Box and Blocks:  Right 16 blocks, Left 55 blocks  SENSATION: Light touch: WFL Hot/Cold: WFL  EDEMA: no swelling on eval  MUSCLE TONE: RUE: Mild and Hypertonic  COGNITION: Overall cognitive status: Within functional limits for tasks assessed  VISION: Subjective report: pt reports wearing glasses more since CVA Baseline vision: Wears glasses all the time, double vision after cataract removal Visual history: cataracts  OBSERVATIONS: Pt attempting grasp and pinch activities, frequently not incorporating index finger on R with increased flexion away from task.   TODAY'S TREATMENT:                                                           09/10/23 Self-care: engaged in discussion regarding tone in RUE and RLE.  Pt asking questions about next steps as reporting increased pain in shoulder with exercises.  Pt asking if Cortizone shot may be beneficial.  OT educated on tone impacting ease of movement, however pt expressing fear of new meds/procedures due to Botox not going well.  OT educated on possible medications and/or other treatment strategies that may aid in reduction of tone and improved movement. Plan to f/u with PCP in regards to next steps. Therapeutic activity: engaged in measurements (see above) with pt demonstrating similar or decreased ROM at shoulder and elbow and significant impairments  with finger flexion and extension this session.  Pt with limited functional use of RUE due to increase in tone, impacting handwriting, box  and blocks, and grip strength. PROM: engaged in PROM at wrist and digits with focus on wrist extension and flexion with cues for technique and hand positioning.  OT educated on engaging in finger extension with wrist flexed to allow for increased extension.  Pt tolerating ROM.    09/03/23 Self-care: engaged in discussion regarding pt current focus and plan with OT.  Pt expressing desire to continue with therapy on a weekly basis and she feels that therapy is continuing to challenge her and provide her with additional exercises and activities to increase functional use of RUE.  Pt reports that she spoke with previous Vivistim pt who was able to answer some questions about Vivistim use and benefits.  OT reiterated progressive benefits of therapy and Vivistim as adjunct to therapy.  Pt reports not wanting to continue to pursue Vivistim at this time.   Handwriting: pt writing name and 6 item grocery list on lined paper with built up foam handle with good legibility, however utilizing larger font on lined paper and significant increased time. Functional reach: attempted to engage in placing cups onto low shelf, however due to short stature and limited shoulder ROM modified task to placing cups on basket on counter top.  Pt able to pick up narrow cones with RUE with minimal finger opening and unable to pick up cups due to wider circumference of cups.  Pt able to pick up from lip of cup and place on basket.  Pt demonstrating 80-90* shoulder flexion, however fatiguing with repetition to 8 cups/cones.    08/28/23 Peg board: engaged in reaching for pegs on mat table to facilitate increased external rotation and shoulder extension.  Then placing large grip pegs into resistive peg board to challenge pinch and manipulation.  Then removing pegs to place into container at head level  to facilitate increased shoulder flexion.  Pt demonstrating min trunk rotation to compensate for decreased external rotation.  Pt requiring increased time when picking up pegs due to decreased motor control, but able to pick up and place into table top peg board against resistance.  Pt then removing with good pinch strength and able to reach to ~80-90* shoulder flexion with increased time to release pegs due to decreased finger extension.    PATIENT EDUCATION: Education details: ongoing condition specific education. Person educated: Patient and Spouse Education method: Explanation, Demonstration, and Verbal cues Education comprehension: verbalized understanding and needs further education  HOME EXERCISE PROGRAM: Access Code: [email or web address] URL: [email or web address] Date: 04/23/2023 Prepared by: [facility] - Outpatient  Rehab - [facility]    GOALS: Goals reviewed with patient? Yes   NEW SHORT TERM GOALS: Target date: 07/06/23  Pt and spouse will be independent with advanced HEP for ROM, strengthening, and coordination to D/C at end of this certification period. Baseline: Goal status: IN PROGRESS  2.  Pt will verbalize understanding of additional treatment interventions (ie Vivistim) to facilitate increased functional return of dominant RUE. Baseline: Goal status: IN PROGRESS    NEW LONG TERM GOALS: Target date: 09/07/23  Pt will demonstrate improved functional grasp in RUE by 5# to turn on/off water, open jar, and self-feeding.  Baseline: R: 9# on 05/31/23 (6# on 07/31/23) Goal status: NOT MET - 5# on 09/10/23  2.  Pt will demonstrate improved UE functional use for ADLs as evidenced by increasing box/ blocks score by 4 blocks with RUE. Baseline: R: 18 blocks on 06/14/23 (15 blocks on 07/17/23) Goal status:  NOT MET - 14 blocks on 09/10/23  3.  Pt will demonstrate improved shoulder flexion as needed for IADLs (to place a dish in the cabinet and/or laundry on a shelf  at eye level) Baseline:  Goal status: NOT MET - 09/03/23  4.  Pt will demonstrate improvement of finger extension by by 10* TAM of index and 15* TAM of long ringer finger to allow for more functional hand opening.  Baseline: -57 IF on 06/14/23 , -55 LF Goal status: NOT MET - 09/10/23  5.  Pt will demonstrate improved handwriting with ability to write 10 item simple grocery list with 90% legibility. Baseline:  Goal status: NOT MET - 6 items on 09/03/23   NEW LONG TERM GOALS: Target date: 10/19/23  Pt will demonstrate improved functional grasp in RUE by 5# to turn on/off water, open jar, and self-feeding.  Baseline: R: 9# on 05/31/23 (5# on 09/10/23) Goal status: ONGOING  2.  Pt will demonstrate improved UE functional use for ADLs as evidenced by increasing box/ blocks score by 4 blocks with RUE. Baseline: R: 18 blocks on 06/14/23 (15 blocks on 09/10/23) Goal status: ONGOING  3.  Pt will demonstrate improved shoulder flexion as needed for IADLs (to place a dish in the cabinet and/or laundry on a shelf at eye level) Baseline:  Goal status: ONGOING  4.  Pt will demonstrate improvement of finger extension by by 10* TAM of index and 15* TAM of long ringer finger to allow for more functional hand opening.  Baseline: -57 IF on 06/14/23 , -55 LF Goal status: ONGOING  5.  Pt will demonstrate improved handwriting with ability to write 10 item simple grocery list with 90% legibility. Baseline:  Goal status: ONGOING   ASSESSMENT:  CLINICAL IMPRESSION: Pt still limited in shoulder flexion and external rotation as needed with functional reach to reach to wash/brush hair and place items onto shelf.  Pt demonstrating increased tone this session and reports of pain in shoulder with attempts at HEP.  Engaged in lengthy conversation about next steps as pt may benefit from a muscle relaxer or other type of medication to aid in reducing spasticity.  Pt with decreased finger flexion and extension this session  with difficulty opening hand without assist from other hand.  Pt with decreased measurements with therapeutic activities, strength, and ROM.   Pt will continue to benefit from occupational therapy services to continue to address decreased ROM, strength, and functional use of dominant RUE.  OT to f/u with PCP in regards to next steps.  Pt in agreement with hold on therapy ~2-4 weeks to allow for f/u with PCP and to initiate next steps in regards to tone management.  PERFORMANCE DEFICITS: in functional skills including ADLs, IADLs, coordination, dexterity, tone, ROM, strength, pain, flexibility, Fine motor control, Gross motor control, mobility, balance, body mechanics, endurance, decreased knowledge of precautions, decreased knowledge of use of DME, and UE functional use and psychosocial skills including environmental adaptation and routines and behaviors.   IMPAIRMENTS: are limiting patient from ADLs and IADLs.   CO-MORBIDITIES: may have co-morbidities  that affects occupational performance. Patient will benefit from skilled OT to address above impairments and improve overall function.  MODIFICATION OR ASSISTANCE TO COMPLETE EVALUATION: Min-Moderate modification of tasks or assist with assess necessary to complete an evaluation.  OT OCCUPATIONAL PROFILE AND HISTORY: Detailed assessment: Review of records and additional review of physical, cognitive, psychosocial history related to current functional performance.  CLINICAL DECISION MAKING: Moderate - several treatment options, min-mod task modification necessary  REHAB POTENTIAL:  Good  EVALUATION COMPLEXITY: Moderate    PLAN:  OT FREQUENCY: 1-2x/week  OT DURATION: 6 weeks  PLANNED INTERVENTIONS: self care/ADL training, therapeutic exercise, therapeutic activity, neuromuscular re-education, manual therapy, passive range of motion, functional mobility training, splinting, electrical stimulation, ultrasound, compression bandaging, moist heat,  cryotherapy, patient/family education, psychosocial skills training, energy conservation, coping strategies training, and DME and/or AE instructions  RECOMMENDED OTHER SERVICES: NA  CONSULTED AND AGREED WITH PLAN OF CARE: Patient and family member/caregiver  PLAN FOR NEXT SESSION: Reassess finger flexion/extension post muscle relaxer.  Engage in shoulder flexion in sitting and standing to facilitate increased strength and endurance with ROM,   continue with gross grasp tasks, further address wrist flexion/extension and ulnar/radial deviation, supination, and finger flexion and extension.  Initiate E-stim if cleared by MD.   Rosalio Loud, OTR/L 09/10/2023, 3:06 PM  [facility] at [facility] [address] [place], [address] [place], [place] [address] Phone # [phone] Fax # [phone]    OCCUPATIONAL THERAPY DISCHARGE SUMMARY  Visits from Start of Care: 26  Current functional level related to goals / functional outcomes: Unsure as pt has not been seen since 09/10/23.  Plan was for pt to take a break as she was overwhelmed by Vivistim option and/ or other options for tone management.  Pt was to f/u with PCP in regards to meds and/or other treatments to manage tone and then possible return to therapy.  Pt with mixed reports on goals of therapy as she had made initial progress but did not meet any LTGs at last visit.  OT did f/u with PCP who reports pt on baclofen for tone management and could benefit from shot in shoulder from sports med if pt desires, communication was to be shared with pt via CMA.  Pt did not initiate return to therapy after update from PCP/CMA.  Of note, pt discharged from PT in January 2025.  Due to >60 days since last seen, pt will be discharged from OT services.  Pt will need new referral if desire to return to therapy services.     Remaining deficits: Impaired grip strength, coordination, and finger extension in dominant RUE   Education  / Equipment: Coordination and AROM/PROM HEP, splint wear and care, functional activities, and education on additional resources (Vivistim) for further functional return.   Patient agrees to discharge. Patient goals were not met. Patient is being discharged due to not returning since the last visit.   Rosalio Loud, OT 12/31/23

## 2023-09-12 ENCOUNTER — Ambulatory Visit: Payer: Medicare PPO

## 2023-09-12 DIAGNOSIS — M6281 Muscle weakness (generalized): Secondary | ICD-10-CM

## 2023-09-12 DIAGNOSIS — R2681 Unsteadiness on feet: Secondary | ICD-10-CM

## 2023-09-12 DIAGNOSIS — R2689 Other abnormalities of gait and mobility: Secondary | ICD-10-CM

## 2023-09-12 DIAGNOSIS — R293 Abnormal posture: Secondary | ICD-10-CM | POA: Diagnosis not present

## 2023-09-12 NOTE — Therapy (Signed)
OUTPATIENT PHYSICAL THERAPY TREATMENT NOTE   Patient Name: Angela Buchanan MRN: [id number] DOB:1951-05-10, 72 y.o., female Today's Date: 09/12/2023  PCP:  Garth Bigness, MD   REFERRING PROVIDER:  Garth Bigness, MD        END OF SESSION:   PT End of Session - 09/12/23 1446     Visit Number 52    Date for PT Re-Evaluation 09/28/23    Authorization Type [employer]    Authorization Time Period 10 visits 10/21-12/27    Authorization - Visit Number 7    Authorization - Number of Visits 10    Progress Note Due on Visit 60    PT Start Time 1401    PT Stop Time 1446    PT Time Calculation (min) 45 min    Equipment Utilized During Treatment Gait belt    Activity Tolerance Patient tolerated treatment well    Behavior During Therapy WFL for tasks assessed/performed                                Past Medical History:  Diagnosis Date   Atypical chest pain 07/19/2021   Bigeminy    no current med.   Breast cancer (HCC) 06/2017   right   Bruises easily    CAD in native artery 10/31/2022   Carotid bruit 11/13/2019   Dental crowns present    History of diverticulitis    Hypertension    states under control with med., has been on med. x 5 yr.   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   Past Surgical History:  Procedure Laterality Date   ABDOMINAL HYSTERECTOMY     partial   BREAST LUMPECTOMY Right 06/22/2017   Malignant   BREAST LUMPECTOMY WITH RADIOACTIVE SEED AND SENTINEL LYMPH NODE BIOPSY Right 06/22/2017   Procedure: RIGHT BREAST LUMPECTOMY WITH RADIOACTIVE SEED AND RIGHT AXILLARY DEEP SENTINEL LYMPH NODE BIOPSY WITH BLUE DYE INJECTION;  Surgeon: Claud Kelp, MD;  Location: [facility];  Service: General;  Laterality: Right;   BREAST LUMPECTOMY WITH RADIOACTIVE SEED LOCALIZATION Left 06/22/2017   Procedure: LEFT BREAST LUMPECTOMY WITH RADIOACTIVE SEED LOCALIZATION;  Surgeon: Claud Kelp, MD;  Location: [facility];  Service: General;  Laterality: Left;   CATARACT EXTRACTION W/ INTRAOCULAR LENS  IMPLANT, BILATERAL Bilateral    EXCISION OF BREAST BIOPSY Left 06/22/2017   benign   LOOP RECORDER INSERTION N/A 05/16/2022   Procedure: LOOP RECORDER INSERTION;  Surgeon: Lanier Prude, MD;  Location: [facility];  Service: Cardiovascular;  Laterality: N/A;   PERCUTANEOUS PINNING Right 06/22/2022   Procedure: PERCUTANEOUS PINNING OF RIGHT HIP;  Surgeon: Bjorn Pippin, MD;  Location: [facility];  Service: Orthopedics;  Laterality: Right;   TONSILLECTOMY     age 82   Patient Active Problem List   Diagnosis Date Noted   Right spastic hemiplegia (HCC) 12/14/2022   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   CAD in native artery 10/31/2022   Malnutrition of moderate degree 06/22/2022   Closed fracture of femur, neck (HCC) 06/21/2022   Urinary frequency 06/21/2022   Hypokalemia 06/21/2022   Heart murmur 06/21/2022   Hypothyroidism 06/21/2022   Dysphagia 05/17/2022  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 05/11/2020   PVC (premature ventricular contraction) 05/11/2020   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   Carotid bruit 11/13/2019   Pure hypercholesterolemia 11/13/2019   Malignant neoplasm of upper-outer quadrant of right breast in female, estrogen receptor positive (HCC) 05/22/2017   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   Diplopia 09/17/2014   Dizziness and giddiness 09/17/2014   Essential  hypertension 07/22/2014   Chest pain 07/22/2014   Family history of heart disease 07/22/2014    REFERRING DIAG:  R29.6 (ICD-10-CM) - Repeated falls, closed fracture of Rt hip    THERAPY DIAG:  Muscle weakness (generalized)  Unsteadiness on feet  Other abnormalities of gait and mobility  Rationale for Evaluation and Treatment Rehabilitation  PERTINENT HISTORY: hypertension, breast cancer, hyperlipidemia, Lt hip fracture secondary to fall 06/2022, CVA 06/2022   PRECAUTIONS:  Fall, Rt breast cancer with node removal, osteoporosis    SUBJECTIVE:                                                                                                                                                                                      SUBJECTIVE STATEMENT:  I will see Dr Everardo Pacific 12/23 at 10:15 to have him check my hip.     Pt spouse present during visit.   PAIN:  PAIN:  Are you having pain? No  OBJECTIVE: (objective measures completed at initial evaluation unless otherwise dated)  DIAGNOSTIC FINDINGS: Rt hip fracture and ORIF    COGNITION: Overall cognitive status: Within functional limits for tasks assessed                         SENSATION: WFL  POSTURE: rounded shoulders, forward head, flexed trunk , and weight shift left   PALPATION: NA   LOWER EXTREMITY ROM: Rt hip limited by 50%, hamstrings limited by 50% bil.    LOWER EXTREMITY MMT:   MMT Right eval Right  09/27/22 Left eval Right 10/30/22 Right  01/17/23 Right 05/23/23 Left 10/30/22 Left 01/17/23  Hip flexion 3+ 4- 4- 4- 4 4 4- 4+  Hip extension            Hip abduction 3-  4     4  Hip adduction            Hip internal rotation            Hip external rotation  Knee flexion 3- 4- 3+  4 4  4+  Knee extension 3+ 4 4- 4 painful hip 4+ 4+ 4- 4+  Ankle dorsiflexion 3+ 4- 4- 4-   4   Ankle plantarflexion            Ankle inversion            Ankle eversion             (Blank rows = not tested)    FUNCTIONAL TESTS:   Eval: Timed up and go (TUG): 1 min, 27 seconds   09/27/22: 5x sit to stand: 29 seconds with use of Lt hand  TUG: 27 seconds   10/30/22: 5x sit to stand: 23 sec use of LT hand     TUG: 26 sec with standard cane   11/20/22: 5x sit to stand: 18.68 seconds    TUG: 19.45 without device    3 minute walk test: 225 feet with a standard cane   01/10/23: 5x sit to stand: 15.77 seconds   01/17/23: TUG with cane: 15.41 seconds with cane    3 min walk test: 313 feet with cane    03/07/23: 3 min walk 275 feet with single point cane   TUG: 16 seconds   03/14/23: 6 min walk test: 723 feet with standard cane    TUG: 14.16 without cane    5x sit to stand: 12.15 seconds without hand   03/28/23 TUG 11 seconds without cane  5x STS 10.6 seconds no Ues  04/18/2023: 6 min walk test:  532 ft with [facility]  05/09/23: 6 min walk test with SPC: 667 feet - RPE 5  05/23/23: TUG: 11 seconds without cane 5x sit to stand: 12.71 seconds  6 min walk test: 763 feet  06/06/23:  5x sit to stand: 11.98 seconds    07/11/23:  5x sit to stand: 11.47 seconds   Short Physical Performance Battery:    Balance:       Side by side stance:    points (1)-1  Semi -tandem:     points (1)-1  Tandem:      points (2)-2    Gait Speed: (32m=9.84 feet) done 2x take the best time:   points                                           6.31 seconds-2 pts     Repeated Chair Stands:      points   (timer stopped when straight on 5th stand)                                             9.69 seconds- 4 pts  Total score=  10/12  points <10/12 predictive of 1 or more mobility limitations and increased risk of mobility disability 6 or less/12 associated with a higher fall rate         07/18/23: 6 min walk test-674 feet           08/22/23:  3 meter test: 4.91 seconds- still 2 pts on SPPB  09/12/23: 6 min walk test: 594 feet- with gait belt and single point cane  TODAY'S TREATMENT:   DATE:  09/12/23 Nustep level 3 x10 minutes with LE only >55 spm PT present throughout to discuss status 6 min walk test- see above  Farmers carry 5# Lt UE-full length of hallway between gyms.  Rest at end and repeat.  Gait with looking in varying directions up and down the hallway with gait belt Sit to stand with 6# weight: 2x10  Leg Press (seat at 5) 55# 2x10, single leg: Lt 35#, Rt 15# 2x10 bil each- continues to be challenged by weight Weaving in/out of cones gait belt and CGA by PT.  sidestepping at barre with single arm support  DATE:  09/05/23 Nustep level 3 x10 minutes with LE only >55 spm PT present throughout to discuss status Farmers carry 5# Lt UE-full length of hallway between gyms.  Rest at end and repeat.  Gait with looking in varying directions up and down the hallway with gait belt Sit to stand with 6# weight: 2x10  Rt hip flexor stretch on step 3x20 seconds  Leg Press (seat at 5) 55# 2x10, single leg: Lt 35#, Rt 15# 2x10 bil each- continues to be challenged by weight Weaving in/out of cones gait belt and CGA by PT.  Step to over hurdles with Lt UE support on barre: Lt lead and Rt lead with verbal cues for step height to clear hurdle sidestepping at barre with single arm support   DATE:  08/22/23 Nustep level 3 x10 minutes with LE only >55 spm PT present throughout to discuss status [employer] carry 5# Lt UE-full length of hallway between gyms.  Rest at end and repeat.  Gait with looking in varying directions up and down the hallway with gait belt Sit to stand with 6# weight: 2x10  Leg Press (seat at 5) 55# 2x10, single leg: Lt 35#, Rt 15# 2x10 bil each- continues to be challenged by weight Weaving in/out of cones gait belt and CGA by PT.  Step to over hurdles with Lt UE support on barre: Lt lead and Rt lead with verbal cues for step height, sidestepping at barre   PATIENT EDUCATION:  Education details: Access Code: [email or web address] Person educated:  Patient Education method: Explanation, Demonstration, and Handouts Education comprehension: verbalized understanding and returned demonstration   HOME EXERCISE PROGRAM: Access Code: [email or web address] URL: [email or web address] Date: 09/27/2022 Prepared by: Tresa Endo  Exercises - Seated Long Arc Quad  - 3 x daily - 7 x weekly - 2 sets - 10 reps - 5 hold - Seated March   - 3 x daily - 7 x weekly - 3 sets - 10 reps - Seated Heel Toe Raises   - 3 x daily - 7 x weekly - 2 sets - 10 reps - Seated Isometric Hip Adduction with Ball  - 3 x daily - 7 x weekly - 2 sets - 10 reps - Sit to Stand with Armchair  - 2 x daily - 7 x weekly - 2 sets - 5-10 reps - Standing Hip Abduction with Counter Support  - 1 x daily - 7 x weekly - 2 sets - 10 reps - Heel Raises with Counter Support  - 2 x daily - 7 x weekly - 2 sets - 10 reps  Patient Education - Walking with a Single [employer] - Modified 4 Point Gait Pattern  ASSESSMENT:   CLINICAL IMPRESSION:     Pt continues to walk regularly with her dog for about 45 minutes as able.  Pt reports Rt anterior hip pain and PT  showed her how to stretch this at home.  She plans to follow-up with Dr Everardo Pacific to make sure her surgical pins are intact.  No change in 6 min walk time, this appears to be a plateau distance.  Her gait speed is slow and Rt LE weakness secondary to CVA puts her at falls risk due to difficulty clearing the Rt foot when she is fatigued.  Balance is improving as pt requires less guard with balance exercises.  Slow progress due to chronic condition and deficits associated with stroke. Patient will benefit from skilled PT to address the below impairments and improve overall function.   OBJECTIVE IMPAIRMENTS: Abnormal gait, decreased activity tolerance, decreased balance, decreased mobility, difficulty walking, decreased strength, decreased safety awareness, impaired perceived functional ability, impaired flexibility, postural dysfunction, and pain.     ACTIVITY LIMITATIONS: carrying, lifting, sitting, standing, stairs, transfers, hygiene/grooming, and locomotion level   PARTICIPATION LIMITATIONS: meal prep, cleaning, laundry, driving, shopping, and community activity   PERSONAL FACTORS: Age, Past/current experiences, and 1-2 comorbidities: CVA, falls, Rt hip fracture with ORIF  are also affecting patient's functional outcome.    REHAB POTENTIAL: Good   CLINICAL DECISION MAKING: Evolving/moderate complexity   EVALUATION COMPLEXITY: Moderate     GOALS: Goals reviewed with patient? Yes   SHORT TERM GOALS: Target date: 09/06/2022   Be independent in initial HEP Baseline: Goal status: Goal met 08/14/22   2.  Improve LE strength to perform sit to stand with moderate Lt UE support Baseline:  Goal status: Goal met 08/28/22  3.  Perform TUG in < or = to 60 seconds to reduce falls risk Baseline: 27 seconds (09/27/22) Goal status: MET      LONG TERM GOALS: Target date:  09/28/23   Be independent in advanced HEP Baseline: independent in current HEP and has tolerated progress when appropriate (07/18/23)  Goal status: in progress    2.  Perform TUG in < or = to 13 seconds to reduce falls risk Baseline: 11 seconds without cane (05/23/23) Goal status: met   3.  ambulate > or = to 750 feet in 6 minutes to improve community ambulation and independence  Baseline: this has fluctuated between 674-763 feet- not consistent ( 07/18/23) Goal status: revised     4.  perform 5x sit to stand in < or = to 12 seconds to reduce falls risk  Baseline: 11.47 seconds (07/11/23)  Goal Status: MET  5. Improve balance to walk her dog short distances with independence   Baseline: walking with dog now, slowly (07/18/23)  Goal status: MET      6. Improve gait speed to perform 44m walk in < or = to 3.6 seconds    Baseline: 6.31 seconds (07/18/23)    Goal Status: NEW   PLAN:   PT FREQUENCY: 1x/week   PT DURATION: 10 weeks   PLANNED  INTERVENTIONS: Therapeutic exercises, Therapeutic activity, Neuromuscular re-education, Balance training, Gait training, Patient/Family education, Self Care, Joint mobilization, Stair training, Dry Needling, Electrical stimulation, Cryotherapy, Moist heat, Taping, Manual therapy, and Re-evaluation   PLAN FOR NEXT SESSION: Continue to work on gait , strength and balance. Endurance, change of direction.    Lorrene Reid, PT 09/12/23 2:47 PM   [facility] [address], [address] [place], [place] [address] Phone # [phone] Fax [phone]

## 2023-09-19 ENCOUNTER — Ambulatory Visit: Payer: Medicare PPO

## 2023-09-19 DIAGNOSIS — M6281 Muscle weakness (generalized): Secondary | ICD-10-CM

## 2023-09-19 DIAGNOSIS — R2689 Other abnormalities of gait and mobility: Secondary | ICD-10-CM

## 2023-09-19 DIAGNOSIS — R293 Abnormal posture: Secondary | ICD-10-CM | POA: Diagnosis not present

## 2023-09-19 DIAGNOSIS — R2681 Unsteadiness on feet: Secondary | ICD-10-CM | POA: Diagnosis not present

## 2023-09-19 NOTE — Therapy (Signed)
OUTPATIENT PHYSICAL THERAPY TREATMENT NOTE   Patient Name: Angela Buchanan MRN: [id number] DOB:Nov 06, 1950, 72 y.o., female Today's Date: 09/19/2023  PCP:  Garth Bigness, MD   REFERRING PROVIDER:  Garth Bigness, MD        END OF SESSION:   PT End of Session - 09/19/23 1445     Visit Number 53    Date for PT Re-Evaluation 09/28/23    Authorization Type [employer]    Authorization Time Period 10 visits 10/21-12/27    Authorization - Visit Number 8    Authorization - Number of Visits 10    Progress Note Due on Visit 60    PT Start Time 1402    PT Stop Time 1446    PT Time Calculation (min) 44 min    Activity Tolerance Patient tolerated treatment well    Behavior During Therapy [facility] for tasks assessed/performed                                 Past Medical History:  Diagnosis Date   Atypical chest pain 07/19/2021   Bigeminy    no current med.   Breast cancer (HCC) 06/2017   right   Bruises easily    CAD in native artery 10/31/2022   Carotid bruit 11/13/2019   Dental crowns present    History of diverticulitis    Hypertension    states under control with med., has been on med. x 5 yr.   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   Past Surgical History:  Procedure Laterality Date   ABDOMINAL HYSTERECTOMY     partial   BREAST LUMPECTOMY Right 06/22/2017   Malignant   BREAST LUMPECTOMY WITH RADIOACTIVE SEED AND SENTINEL LYMPH NODE BIOPSY Right 06/22/2017   Procedure: RIGHT BREAST LUMPECTOMY WITH RADIOACTIVE SEED AND RIGHT AXILLARY DEEP SENTINEL LYMPH NODE BIOPSY WITH BLUE DYE INJECTION;  Surgeon: Claud Kelp, MD;  Location: [facility];  Service: General;  Laterality:  Right;   BREAST LUMPECTOMY WITH RADIOACTIVE SEED LOCALIZATION Left 06/22/2017   Procedure: LEFT BREAST LUMPECTOMY WITH RADIOACTIVE SEED LOCALIZATION;  Surgeon: Claud Kelp, MD;  Location: [facility];  Service: General;  Laterality: Left;   CATARACT EXTRACTION W/ INTRAOCULAR LENS  IMPLANT, BILATERAL Bilateral    EXCISION OF BREAST BIOPSY Left 06/22/2017   benign   LOOP RECORDER INSERTION N/A 05/16/2022   Procedure: LOOP RECORDER INSERTION;  Surgeon: Lanier Prude, MD;  Location: [facility];  Service: Cardiovascular;  Laterality: N/A;   PERCUTANEOUS PINNING Right 06/22/2022   Procedure: PERCUTANEOUS PINNING OF RIGHT HIP;  Surgeon: Bjorn Pippin, MD;  Location: [facility];  Service: Orthopedics;  Laterality: Right;   TONSILLECTOMY     age 79   Patient Active Problem List   Diagnosis Date Noted   Right spastic hemiplegia (HCC) 12/14/2022   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   CAD in native artery 10/31/2022   Malnutrition of moderate degree 06/22/2022   Closed fracture of femur, neck (HCC) 06/21/2022   Urinary frequency 06/21/2022   Hypokalemia 06/21/2022   Heart murmur 06/21/2022   Hypothyroidism 06/21/2022   Dysphagia 05/17/2022   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   CVA (  cerebral vascular accident) (HCC) 05/12/2022   Osteoporosis 01/24/2022   Atypical chest pain 07/19/2021   Temporomandibular joint (TMJ) pain 07/16/2020   Referred otalgia of both ears 07/16/2020   PAC (premature atrial contraction) 05/11/2020   PVC (premature ventricular contraction) 05/11/2020   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   Carotid bruit 11/13/2019   Pure hypercholesterolemia 11/13/2019   Malignant neoplasm of upper-outer quadrant of right breast in female, estrogen receptor positive (HCC) 05/22/2017   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   Diplopia 09/17/2014   Dizziness and giddiness 09/17/2014   Essential hypertension 07/22/2014   Chest pain 07/22/2014    Family history of heart disease 07/22/2014    REFERRING DIAG:  R29.6 (ICD-10-CM) - Repeated falls, closed fracture of Rt hip    THERAPY DIAG:  Muscle weakness (generalized)  Unsteadiness on feet  Other abnormalities of gait and mobility  Rationale for Evaluation and Treatment Rehabilitation  PERTINENT HISTORY: hypertension, breast cancer, hyperlipidemia, Lt hip fracture secondary to fall 06/2022, CVA 06/2022   PRECAUTIONS:  Fall, Rt breast cancer with node removal, osteoporosis    SUBJECTIVE:                                                                                                                                                                                      SUBJECTIVE STATEMENT:  I am going to see someone in sports medicine to address the tone in my Rt arm.   I walked with the dog today.  When I was coming down the steps, I had hip pain in the front.  I see the MD next week.    Pt spouse present during visit.   PAIN:  PAIN:  Are you having pain? No  OBJECTIVE: (objective measures completed at initial evaluation unless otherwise dated)  DIAGNOSTIC FINDINGS: Rt hip fracture and ORIF    COGNITION: Overall cognitive status: Within functional limits for tasks assessed                         SENSATION: WFL  POSTURE: rounded shoulders, forward head, flexed trunk , and weight shift left   PALPATION: NA   LOWER EXTREMITY ROM: Rt hip limited by 50%, hamstrings limited by 50% bil.    LOWER EXTREMITY MMT:   MMT Right eval Right  09/27/22 Left eval Right 10/30/22 Right  01/17/23 Right 05/23/23 Left 10/30/22 Left 01/17/23  Hip flexion 3+ 4- 4- 4- 4 4 4- 4+  Hip extension            Hip abduction 3-  4     4  Hip adduction  Hip internal rotation            Hip external rotation            Knee flexion 3- 4- 3+  4 4  4+  Knee extension 3+ 4 4- 4 painful hip 4+ 4+ 4- 4+  Ankle dorsiflexion 3+ 4- 4- 4-   4   Ankle plantarflexion            Ankle  inversion            Ankle eversion             (Blank rows = not tested)   FUNCTIONAL TESTS:   Eval: Timed up and go (TUG): 1 min, 27 seconds   09/27/22: 5x sit to stand: 29 seconds with use of Lt hand  TUG: 27 seconds   10/30/22: 5x sit to stand: 23 sec use of LT hand     TUG: 26 sec with standard cane   11/20/22: 5x sit to stand: 18.68 seconds    TUG: 19.45 without device    3 minute walk test: 225 feet with a standard cane   01/10/23: 5x sit to stand: 15.77 seconds   01/17/23: TUG with cane: 15.41 seconds with cane    3 min walk test: 313 feet with cane    03/07/23: 3 min walk 275 feet with single point cane   TUG: 16 seconds   03/14/23: 6 min walk test: 723 feet with standard cane    TUG: 14.16 without cane    5x sit to stand: 12.15 seconds without hand   03/28/23 TUG 11 seconds without cane  5x STS 10.6 seconds no Ues  04/18/2023: 6 min walk test:  532 ft with [facility]  05/09/23: 6 min walk test with SPC: 667 feet - RPE 5  05/23/23: TUG: 11 seconds without cane 5x sit to stand: 12.71 seconds  6 min walk test: 763 feet  06/06/23:  5x sit to stand: 11.98 seconds    07/11/23:  5x sit to stand: 11.47 seconds   Short Physical Performance Battery:    Balance:       Side by side stance:    points (1)-1  Semi -tandem:     points (1)-1  Tandem:      points (2)-2    Gait Speed: (102m=9.84 feet) done 2x take the best time:   points                                           6.31 seconds-2 pts     Repeated Chair Stands:      points   (timer stopped when straight on 5th stand)                                             9.69 seconds- 4 pts  Total score=  10/12  points <10/12 predictive of 1 or more mobility limitations and increased risk of mobility disability 6 or less/12 associated with a higher fall rate         07/18/23: 6 min walk test-674 feet           08/22/23:  3 meter test: 4.91 seconds- still 2 pts on SPPB  09/12/23: 6 min walk test: 594 feet-  with gait belt and single  point cane                                                                         TODAY'S TREATMENT:   DATE:  09/19/23 Nustep level 3 x10 minutes with LE only >55 spm PT present throughout to discuss status Seated hamstring stretch 2x20 seconds, adduction bias 2x10 Supine butterfly stretch 3x20 seconds  Supine abduction with yellow band: Rt x10-challenged with this Sit to stand with 6# weight: 2x10  Leg Press (seat at 5) 60# 2x10, single leg: Lt 40#, Rt 15# 2x10 bil each- continues to be challenged by weight    DATE:  09/12/23 Nustep level 3 x10 minutes with LE only >55 spm PT present throughout to discuss status 6 min walk test- see above  Farmers carry 5# Lt UE-full length of hallway between gyms.  Rest at end and repeat.  Gait with looking in varying directions up and down the hallway with gait belt Sit to stand with 6# weight: 2x10  Leg Press (seat at 5) 55# 2x10, single leg: Lt 35#, Rt 15# 2x10 bil each- continues to be challenged by weight Weaving in/out of cones gait belt and CGA by PT.  sidestepping at barre with single arm support  DATE:  09/05/23 Nustep level 3 x10 minutes with LE only >55 spm PT present throughout to discuss status Farmers carry 5# Lt UE-full length of hallway between gyms.  Rest at end and repeat.  Gait with looking in varying directions up and down the hallway with gait belt Sit to stand with 6# weight: 2x10  Rt hip flexor stretch on step 3x20 seconds  Leg Press (seat at 5) 55# 2x10, single leg: Lt 35#, Rt 15# 2x10 bil each- continues to be challenged by weight Weaving in/out of cones gait belt and CGA by PT.  Step to over hurdles with Lt UE support on barre: Lt lead and Rt lead with verbal cues for step height to clear hurdle sidestepping at barre with single arm support    PATIENT EDUCATION:  Education details: Access Code: [email or web address] Person educated: Patient Education method: Explanation, Demonstration, and Handouts Education comprehension:  verbalized understanding and returned demonstration   HOME EXERCISE PROGRAM: Access Code: [email or web address] URL: [email or web address] Date: 09/19/2023 Prepared by: Tresa Endo  Exercises - Seated Long Arc Quad  - 3 x daily - 7 x weekly - 2 sets - 10 reps - 5 hold - Seated Heel Toe Raises   - 3 x daily - 7 x weekly - 2 sets - 10 reps - Seated Isometric Hip Adduction with Ball  - 3 x daily - 7 x weekly - 2 sets - 10 reps - Sit to Stand with Armchair  - 2 x daily - 7 x weekly - 2 sets - 5-10 reps - Heel Raises with Counter Support  - 2 x daily - 7 x weekly - 2 sets - 10 reps - Standing March with Counter Support  - 2 x daily - 7 x weekly - 10 reps - Supine Butterfly Groin Stretch  - 2 x daily - 7 x weekly - 1 sets - 3 reps - 20 hold - Seated Hamstring Stretch  - 2 x daily -  7 x weekly - 1 sets - 3 reps - 20 hold - Seated Hip Abduction with Resistance  - 1 x daily - 7 x weekly - 1-2 sets - 10 reps - Hooklying Isometric Clamshell  - 1 x daily - 7 x weekly - 1-2 sets - 10 reps  Patient Education - Walking with a Single [employer] - Modified 4 Point Gait Pattern  ASSESSMENT:   CLINICAL IMPRESSION:     Pt has continued Rt anterior/lateral hip pain and will see Dr Everardo Pacific next week.  PT added exercises to help with this pain.  Pt is as active as she is able and PT will work over the next month to finalize her HEP for independence with progression after D/C.  is improving as pt requires less guard with balance exercises.  Slow progress due to chronic condition and deficits associated with stroke. Patient will benefit from skilled PT to address the below impairments and improve overall function.   OBJECTIVE IMPAIRMENTS: Abnormal gait, decreased activity tolerance, decreased balance, decreased mobility, difficulty walking, decreased strength, decreased safety awareness, impaired perceived functional ability, impaired flexibility, postural dysfunction, and pain.    ACTIVITY LIMITATIONS: carrying,  lifting, sitting, standing, stairs, transfers, hygiene/grooming, and locomotion level   PARTICIPATION LIMITATIONS: meal prep, cleaning, laundry, driving, shopping, and community activity   PERSONAL FACTORS: Age, Past/current experiences, and 1-2 comorbidities: CVA, falls, Rt hip fracture with ORIF  are also affecting patient's functional outcome.    REHAB POTENTIAL: Good   CLINICAL DECISION MAKING: Evolving/moderate complexity   EVALUATION COMPLEXITY: Moderate     GOALS: Goals reviewed with patient? Yes   SHORT TERM GOALS: Target date: 09/06/2022   Be independent in initial HEP Baseline: Goal status: Goal met 08/14/22   2.  Improve LE strength to perform sit to stand with moderate Lt UE support Baseline:  Goal status: Goal met 08/28/22  3.  Perform TUG in < or = to 60 seconds to reduce falls risk Baseline: 27 seconds (09/27/22) Goal status: MET      LONG TERM GOALS: Target date:  09/28/23   Be independent in advanced HEP Baseline: independent in current HEP and has tolerated progress when appropriate (07/18/23)  Goal status: in progress    2.  Perform TUG in < or = to 13 seconds to reduce falls risk Baseline: 11 seconds without cane (05/23/23) Goal status: met   3.  ambulate > or = to 750 feet in 6 minutes to improve community ambulation and independence  Baseline: this has fluctuated between 674-763 feet- not consistent ( 07/18/23) Goal status: revised     4.  perform 5x sit to stand in < or = to 12 seconds to reduce falls risk  Baseline: 11.47 seconds (07/11/23)  Goal Status: MET  5. Improve balance to walk her dog short distances with independence   Baseline: walking with dog now, slowly (07/18/23)  Goal status: MET      6. Improve gait speed to perform 69m walk in < or = to 3.6 seconds    Baseline: 6.31 seconds (07/18/23)    Goal Status: NEW   PLAN:   PT FREQUENCY: 1x/week   PT DURATION: 10 weeks   PLANNED INTERVENTIONS: Therapeutic exercises,  Therapeutic activity, Neuromuscular re-education, Balance training, Gait training, Patient/Family education, Self Care, Joint mobilization, Stair training, Dry Needling, Electrical stimulation, Cryotherapy, Moist heat, Taping, Manual therapy, and Re-evaluation   PLAN FOR NEXT SESSION: ERO next week to determine future PT.  Strength, gait, and balance. Request  more visits    Lorrene Reid, PT 09/19/23 2:46 PM   [facility] [address], [address] [place], [place] [address] Phone # [phone] Fax [phone]

## 2023-09-24 ENCOUNTER — Ambulatory Visit: Payer: Medicare PPO

## 2023-09-24 DIAGNOSIS — R2689 Other abnormalities of gait and mobility: Secondary | ICD-10-CM

## 2023-09-24 DIAGNOSIS — R293 Abnormal posture: Secondary | ICD-10-CM | POA: Diagnosis not present

## 2023-09-24 DIAGNOSIS — M6281 Muscle weakness (generalized): Secondary | ICD-10-CM | POA: Diagnosis not present

## 2023-09-24 DIAGNOSIS — R2681 Unsteadiness on feet: Secondary | ICD-10-CM | POA: Diagnosis not present

## 2023-09-24 NOTE — Therapy (Signed)
OUTPATIENT PHYSICAL THERAPY TREATMENT NOTE   Patient Name: Angela Buchanan MRN: [id number] DOB:[birth year], 72 y.o., female Today's Date: 09/24/2023  PCP:  Garth Bigness, MD   REFERRING PROVIDER:  Garth Bigness, MD        END OF SESSION:   PT End of Session - 09/24/23 1310     Visit Number 54    Date for PT Re-Evaluation 11/02/23    Authorization Type [employer]    Authorization Time Period 10 visits 10/21-12/27- requested visits on 09/24/23    Authorization - Visit Number 9    Authorization - Number of Visits 10    Progress Note Due on Visit 60    PT Start Time 1231    PT Stop Time 1319    PT Time Calculation (min) 48 min    Activity Tolerance Patient tolerated treatment well    Behavior During Therapy [facility] for tasks assessed/performed                                  Past Medical History:  Diagnosis Date   Atypical chest pain 07/19/2021   Bigeminy    no current med.   Breast cancer (HCC) 06/2017   right   Bruises easily    CAD in native artery 10/31/2022   Carotid bruit 11/13/2019   Dental crowns present    History of diverticulitis    Hypertension    states under control with med., has been on med. x 5 yr.   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   Past Surgical History:  Procedure Laterality Date   ABDOMINAL HYSTERECTOMY     partial   BREAST LUMPECTOMY Right 06/22/2017   Malignant   BREAST LUMPECTOMY WITH RADIOACTIVE SEED AND SENTINEL LYMPH NODE BIOPSY Right 06/22/2017   Procedure: RIGHT BREAST LUMPECTOMY WITH RADIOACTIVE SEED AND RIGHT AXILLARY DEEP SENTINEL LYMPH NODE BIOPSY WITH BLUE DYE INJECTION;  Surgeon: Claud Kelp, MD;  Location: [facility];   Service: General;  Laterality: Right;   BREAST LUMPECTOMY WITH RADIOACTIVE SEED LOCALIZATION Left 06/22/2017   Procedure: LEFT BREAST LUMPECTOMY WITH RADIOACTIVE SEED LOCALIZATION;  Surgeon: Claud Kelp, MD;  Location: [facility];  Service: General;  Laterality: Left;   CATARACT EXTRACTION W/ INTRAOCULAR LENS  IMPLANT, BILATERAL Bilateral    EXCISION OF BREAST BIOPSY Left 06/22/2017   benign   LOOP RECORDER INSERTION N/A 05/16/2022   Procedure: LOOP RECORDER INSERTION;  Surgeon: Lanier Prude, MD;  Location: [facility];  Service: Cardiovascular;  Laterality: N/A;   PERCUTANEOUS PINNING Right 06/22/2022   Procedure: PERCUTANEOUS PINNING OF RIGHT HIP;  Surgeon: Bjorn Pippin, MD;  Location: [facility];  Service: Orthopedics;  Laterality: Right;   TONSILLECTOMY     age 70   Patient Active Problem List   Diagnosis Date Noted   Right spastic hemiplegia (HCC) 12/14/2022   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   CAD in native artery 10/31/2022   Malnutrition of moderate degree 06/22/2022   Closed fracture of femur, neck (HCC) 06/21/2022   Urinary frequency 06/21/2022   Hypokalemia 06/21/2022   Heart murmur 06/21/2022   Hypothyroidism 06/21/2022   Dysphagia 05/17/2022   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   CVA (cerebral vascular accident) (HCC) 05/12/2022   Osteoporosis 01/24/2022   Atypical chest pain 07/19/2021   Temporomandibular joint (TMJ) pain 07/16/2020   Referred otalgia of both ears 07/16/2020   PAC (premature atrial contraction) 05/11/2020   PVC (premature ventricular contraction) 05/11/2020   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   Carotid bruit 11/13/2019   Pure hypercholesterolemia 11/13/2019   Malignant neoplasm of upper-outer quadrant of right breast in female, estrogen receptor positive (HCC) 05/22/2017   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   Diplopia 09/17/2014   Dizziness and giddiness 09/17/2014   Essential hypertension  07/22/2014   Chest pain 07/22/2014   Family history of heart disease 07/22/2014    REFERRING DIAG:  R29.6 (ICD-10-CM) - Repeated falls, closed fracture of Rt hip    THERAPY DIAG:  Muscle weakness (generalized) - Plan: PT plan of care cert/re-cert  Unsteadiness on feet - Plan: PT plan of care cert/re-cert  Other abnormalities of gait and mobility - Plan: PT plan of care cert/re-cert  Rationale for Evaluation and Treatment Rehabilitation  PERTINENT HISTORY: hypertension, breast cancer, hyperlipidemia, Lt hip fracture secondary to fall 06/2022, CVA 06/2022   PRECAUTIONS:  Fall, Rt breast cancer with node removal, osteoporosis    SUBJECTIVE:                                                                                                                                                                                     SUBJECTIVE STATEMENT:  I am going to postpone seeing Dr Everardo Pacific for my hip until I see the sports medicine doctor.  I've been doing the exercises and it is feeling somewhat better.  I've been walking but not trying to push it.    Pt spouse present during visit.   PAIN:  PAIN:  Are you having pain? Yes 5/10 Rt hip pain-lateral Aggravated by standing and walking Reduced with stretching and rest  OBJECTIVE: (objective measures completed at initial evaluation unless otherwise dated)  DIAGNOSTIC FINDINGS: Rt hip fracture and ORIF    COGNITION: Overall cognitive status: Within functional limits for tasks assessed                         SENSATION: WFL  POSTURE: rounded shoulders, forward head, flexed trunk , and weight shift left   PALPATION: NA   LOWER EXTREMITY ROM: Rt hip limited by 50%, hamstrings limited by 50% bil.    LOWER EXTREMITY MMT:   MMT Right eval Right  09/27/22 Left eval Right 10/30/22 Right  01/17/23 Right 05/23/23 Left 10/30/22 Left 01/17/23  Hip flexion 3+ 4- 4- 4- 4 4 4- 4+  Hip  extension            Hip abduction 3-  4     4  Hip  adduction            Hip internal rotation            Hip external rotation            Knee flexion 3- 4- 3+  4 4  4+  Knee extension 3+ 4 4- 4 painful hip 4+ 4+ 4- 4+  Ankle dorsiflexion 3+ 4- 4- 4-   4   Ankle plantarflexion            Ankle inversion            Ankle eversion             (Blank rows = not tested)   FUNCTIONAL TESTS:   Eval: Timed up and go (TUG): 1 min, 27 seconds   09/27/22: 5x sit to stand: 29 seconds with use of Lt hand  TUG: 27 seconds   10/30/22: 5x sit to stand: 23 sec use of LT hand     TUG: 26 sec with standard cane   11/20/22: 5x sit to stand: 18.68 seconds    TUG: 19.45 without device    3 minute walk test: 225 feet with a standard cane   01/10/23: 5x sit to stand: 15.77 seconds   01/17/23: TUG with cane: 15.41 seconds with cane    3 min walk test: 313 feet with cane    03/07/23: 3 min walk 275 feet with single point cane   TUG: 16 seconds   03/14/23: 6 min walk test: 723 feet with standard cane    TUG: 14.16 without cane    5x sit to stand: 12.15 seconds without hand   03/28/23 TUG 11 seconds without cane  5x STS 10.6 seconds no Ues  04/18/2023: 6 min walk test:  532 ft with [facility]  05/09/23: 6 min walk test with SPC: 667 feet - RPE 5  05/23/23: TUG: 11 seconds without cane 5x sit to stand: 12.71 seconds  6 min walk test: 763 feet  06/06/23:  5x sit to stand: 11.98 seconds    07/11/23:  5x sit to stand: 11.47 seconds   Short Physical Performance Battery:    Balance:       Side by side stance:    points (1)-1  Semi -tandem:     points (1)-1  Tandem:      points (2)-2    Gait Speed: (43m=9.84 feet) done 2x take the best time:   points                                           6.31 seconds-2 pts     Repeated Chair Stands:      points   (timer stopped when straight on 5th stand)                                             9.69 seconds- 4 pts  Total score=  10/12  points <10/12 predictive of 1 or more mobility limitations and increased risk of  mobility disability 6 or less/12 associated with a higher fall rate  07/18/23: 6 min walk test-674 feet           08/22/23:  3 meter test: 4.91 seconds- still 2 pts on SPPB  09/12/23: 6 min walk test: 594 feet- with gait belt and single point cane  09/12/23:  3 meter walk: 4.32- 3 pts on SPPB  Repeated chair stands: 9.7 seconds  SPPB 11/12                                                                 TODAY'S TREATMENT:   DATE:  09/24/23 Nustep level 3 x10 minutes with LE only >55 spm PT present throughout to discuss status Tested objective measures 11/12 on SPPB Seated hamstring stretch 2x20 seconds, adduction bias 2x10 Weaving in and out of cones: gait belt used  Sit to stand with 6# weight: 2x10  Leg Press (seat at 5) 60# 2x10, single leg: Lt 40#, Rt 15# 2x10 bil each- continues to be challenged by weight  DATE:  09/19/23 Nustep level 3 x10 minutes with LE only >55 spm PT present throughout to discuss status Seated hamstring stretch 2x20 seconds, adduction bias 2x10 Supine butterfly stretch 3x20 seconds  Supine abduction with yellow band: Rt x10-challenged with this Sit to stand with 6# weight: 2x10  Leg Press (seat at 5) 60# 2x10, single leg: Lt 40#, Rt 15# 2x10 bil each- continues to be challenged by weight    DATE:  09/12/23 Nustep level 3 x10 minutes with LE only >55 spm PT present throughout to discuss status 6 min walk test- see above  Farmers carry 5# Lt UE-full length of hallway between gyms.  Rest at end and repeat.  Gait with looking in varying directions up and down the hallway with gait belt Sit to stand with 6# weight: 2x10  Leg Press (seat at 5) 55# 2x10, single leg: Lt 35#, Rt 15# 2x10 bil each- continues to be challenged by weight Weaving in/out of cones gait belt and CGA by PT.  sidestepping at barre with single arm support   PATIENT EDUCATION:  Education details: Access Code: [email or web address] Person educated: Patient Education method: Programmer, multimedia,  Demonstration, and Handouts Education comprehension: verbalized understanding and returned demonstration   HOME EXERCISE PROGRAM: Access Code: [email or web address] URL: [email or web address] Date: 09/19/2023 Prepared by: Tresa Endo  Exercises - Seated Long Arc Quad  - 3 x daily - 7 x weekly - 2 sets - 10 reps - 5 hold - Seated Heel Toe Raises   - 3 x daily - 7 x weekly - 2 sets - 10 reps - Seated Isometric Hip Adduction with Ball  - 3 x daily - 7 x weekly - 2 sets - 10 reps - Sit to Stand with Armchair  - 2 x daily - 7 x weekly - 2 sets - 5-10 reps - Heel Raises with Counter Support  - 2 x daily - 7 x weekly - 2 sets - 10 reps - Standing March with Counter Support  - 2 x daily - 7 x weekly - 10 reps - Supine Butterfly Groin Stretch  - 2 x daily - 7 x weekly - 1 sets - 3 reps - 20 hold - Seated Hamstring Stretch  - 2 x daily - 7 x weekly - 1 sets - 3 reps - 20 hold -  Seated Hip Abduction with Resistance  - 1 x daily - 7 x weekly - 1-2 sets - 10 reps - Hooklying Isometric Clamshell  - 1 x daily - 7 x weekly - 1-2 sets - 10 reps  Patient Education - Walking with a Single [employer] - Modified 4 Point Gait Pattern  ASSESSMENT:   CLINICAL IMPRESSION:     She is not going to see Dr Everardo Pacific until she sees the MD regarding her Rt UE spasticity. Pt is making steady progress toward goals and balance and functional mobility is improving .  She is limited by post-stroke weakness and spasticity in the Lt side.  Pt is walking her dog as able and remains at a falls risk due to weakness.  Pt is as active as she is able and PT will work over the next month to finalize her HEP for independence with progression after D/C.  SPPB test is improved from 10/12 to 11/12, indicating reduced mobility deficits. Pt is improving as pt requires less guard with balance exercises.  Slow progress due to chronic condition and deficits associated with stroke. Patient will benefit from skilled PT to address the below impairments  and improve overall function.   OBJECTIVE IMPAIRMENTS: Abnormal gait, decreased activity tolerance, decreased balance, decreased mobility, difficulty walking, decreased strength, decreased safety awareness, impaired perceived functional ability, impaired flexibility, postural dysfunction, and pain.    ACTIVITY LIMITATIONS: carrying, lifting, sitting, standing, stairs, transfers, hygiene/grooming, and locomotion level   PARTICIPATION LIMITATIONS: meal prep, cleaning, laundry, driving, shopping, and community activity   PERSONAL FACTORS: Age, Past/current experiences, and 1-2 comorbidities: CVA, falls, Rt hip fracture with ORIF  are also affecting patient's functional outcome.    REHAB POTENTIAL: Good   CLINICAL DECISION MAKING: Evolving/moderate complexity   EVALUATION COMPLEXITY: Moderate     GOALS: Goals reviewed with patient? Yes   SHORT TERM GOALS: Target date: 09/06/2022   Be independent in initial HEP Baseline: Goal status: Goal met 08/14/22   2.  Improve LE strength to perform sit to stand with moderate Lt UE support Baseline:  Goal status: Goal met 08/28/22  3.  Perform TUG in < or = to 60 seconds to reduce falls risk Baseline: 27 seconds (09/27/22) Goal status: MET      LONG TERM GOALS: Target date: 11/02/23   Be independent in advanced HEP for continued gains after D/C Baseline: advanced last session and will advance again before D/C in 4 weeks (09/24/23) Goal status: in progress    2.  Perform TUG in < or = to 13 seconds to reduce falls risk Baseline: 11 seconds without cane (05/23/23) Goal status: met   3.  ambulate > or = to 750 feet in 6 minutes to improve community ambulation and independence  Baseline: this has fluctuated between 674-763 feet- plateau with this- 09/24/23 Goal status: deferred      4.  perform 5x sit to stand in < or = to 12 seconds to reduce falls risk  Baseline: 11.47 seconds (07/11/23)  Goal Status: MET  5. Improve balance to walk  her dog short distances with independence   Baseline: walking with dog now, slowly (07/18/23)  Goal status: MET      6. Improve gait speed to perform 25m walk in < or = to 3.6 seconds    Baseline: 4.91 seconds (09/24/23)    Goal Status: in progress   PLAN:   PT FREQUENCY: 1x/week   PT DURATION: 4 weeks   PLANNED INTERVENTIONS: Therapeutic exercises,  Therapeutic activity, Neuromuscular re-education, Balance training, Gait training, Patient/Family education, Self Care, Joint mobilization, Stair training, Dry Needling, Electrical stimulation, Cryotherapy, Moist heat, Taping, Manual therapy, and Re-evaluation   PLAN FOR NEXT SESSION:   Strength, gait, and balance. Check on [employer] visits    Lorrene Reid, PT 09/24/23 1:18 PM   [facility] [address] [place], [place] [address] Phone # [phone] Fax [phone]

## 2023-10-01 ENCOUNTER — Ambulatory Visit: Payer: Medicare PPO

## 2023-10-01 ENCOUNTER — Encounter: Payer: Self-pay | Admitting: Adult Health

## 2023-10-01 DIAGNOSIS — M6281 Muscle weakness (generalized): Secondary | ICD-10-CM | POA: Diagnosis not present

## 2023-10-01 DIAGNOSIS — R2689 Other abnormalities of gait and mobility: Secondary | ICD-10-CM

## 2023-10-01 DIAGNOSIS — R293 Abnormal posture: Secondary | ICD-10-CM | POA: Diagnosis not present

## 2023-10-01 DIAGNOSIS — R2681 Unsteadiness on feet: Secondary | ICD-10-CM

## 2023-10-01 NOTE — Therapy (Signed)
OUTPATIENT PHYSICAL THERAPY TREATMENT NOTE   Patient Name: Angela Buchanan MRN: [id number] DOB:[birth year], 72 y.o., female Today's Date: 10/01/2023  PCP:  Garth Bigness, MD   REFERRING PROVIDER:  Garth Bigness, MD        END OF SESSION:   PT End of Session - 10/01/23 1444     Visit Number 55    Date for PT Re-Evaluation 11/02/23    Authorization Type [employer]    Authorization Time Period 4 visits-12-30/2024-11/02/2023    Authorization - Visit Number 1    Authorization - Number of Visits 4    Progress Note Due on Visit 60    PT Start Time 1401    PT Stop Time 1444    PT Time Calculation (min) 43 min    Equipment Utilized During Treatment Gait belt    Activity Tolerance Patient tolerated treatment well    Behavior During Therapy WFL for tasks assessed/performed                                   Past Medical History:  Diagnosis Date   Atypical chest pain 07/19/2021   Bigeminy    no current med.   Breast cancer (HCC) 06/2017   right   Bruises easily    CAD in native artery 10/31/2022   Carotid bruit 11/13/2019   Dental crowns present    History of diverticulitis    Hypertension    states under control with med., has been on med. x 5 yr.   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   Past Surgical History:  Procedure Laterality Date   ABDOMINAL HYSTERECTOMY     partial   BREAST LUMPECTOMY Right 06/22/2017   Malignant   BREAST LUMPECTOMY WITH RADIOACTIVE SEED AND SENTINEL LYMPH NODE BIOPSY Right 06/22/2017   Procedure: RIGHT BREAST LUMPECTOMY WITH RADIOACTIVE SEED AND RIGHT AXILLARY DEEP SENTINEL LYMPH NODE BIOPSY WITH BLUE DYE INJECTION;  Surgeon: Claud Kelp, MD;   Location: [facility];  Service: General;  Laterality: Right;   BREAST LUMPECTOMY WITH RADIOACTIVE SEED LOCALIZATION Left 06/22/2017   Procedure: LEFT BREAST LUMPECTOMY WITH RADIOACTIVE SEED LOCALIZATION;  Surgeon: Claud Kelp, MD;  Location: [facility];  Service: General;  Laterality: Left;   CATARACT EXTRACTION W/ INTRAOCULAR LENS  IMPLANT, BILATERAL Bilateral    EXCISION OF BREAST BIOPSY Left 06/22/2017   benign   LOOP RECORDER INSERTION N/A 05/16/2022   Procedure: LOOP RECORDER INSERTION;  Surgeon: Lanier Prude, MD;  Location: [facility];  Service: Cardiovascular;  Laterality: N/A;   PERCUTANEOUS PINNING Right 06/22/2022   Procedure: PERCUTANEOUS PINNING OF RIGHT HIP;  Surgeon: Bjorn Pippin, MD;  Location: [facility];  Service: Orthopedics;  Laterality: Right;   TONSILLECTOMY     age 42   Patient Active Problem List   Diagnosis Date Noted   Right spastic hemiplegia (HCC) 12/14/2022   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   CAD in native artery 10/31/2022   Malnutrition of moderate degree 06/22/2022   Closed fracture of femur, neck (HCC) 06/21/2022   Urinary frequency 06/21/2022   Hypokalemia 06/21/2022   Heart murmur 06/21/2022   Hypothyroidism 06/21/2022   Dysphagia  05/17/2022   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 05/11/2020   PVC (premature ventricular contraction) 05/11/2020   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   Carotid bruit 11/13/2019   Pure hypercholesterolemia 11/13/2019   Malignant neoplasm of upper-outer quadrant of right breast in female, estrogen receptor positive (HCC) 05/22/2017   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   Diplopia 09/17/2014   Dizziness and giddiness  09/17/2014   Essential hypertension 07/22/2014   Chest pain 07/22/2014   Family history of heart disease 07/22/2014    REFERRING DIAG:  R29.6 (ICD-10-CM) - Repeated falls, closed fracture of Rt hip    THERAPY DIAG:  Muscle weakness (generalized)  Unsteadiness on feet  Other abnormalities of gait and mobility  Rationale for Evaluation and Treatment Rehabilitation  PERTINENT HISTORY: hypertension, breast cancer, hyperlipidemia, Lt hip fracture secondary to fall 06/2022, CVA 06/2022   PRECAUTIONS:  Fall, Rt breast cancer with node removal, osteoporosis    SUBJECTIVE:                                                                                                                                                                                     SUBJECTIVE STATEMENT:  I've been doing my stretches and they feel good.  My hip is feeling better since I've been doing the stretches.   Pt spouse present during visit.   PAIN:  PAIN:  Are you having pain? Yes 0-4/10 Rt hip pain-lateral Aggravated by standing and walking Reduced with stretching and rest  OBJECTIVE: (objective measures completed at initial evaluation unless otherwise dated)  DIAGNOSTIC FINDINGS: Rt hip fracture and ORIF    COGNITION: Overall cognitive status: Within functional limits for tasks assessed                         SENSATION: WFL  POSTURE: rounded shoulders, forward head, flexed trunk , and weight shift left   PALPATION: NA   LOWER EXTREMITY ROM: Rt hip limited by 50%, hamstrings limited by 50% bil.    LOWER EXTREMITY MMT:   MMT Right eval Right  09/27/22 Left eval Right 10/30/22 Right  01/17/23 Right 05/23/23 Left 10/30/22 Left 01/17/23  Hip flexion 3+ 4- 4- 4- 4 4 4- 4+  Hip extension            Hip abduction 3-  4     4  Hip adduction  Hip internal rotation            Hip external rotation            Knee flexion 3- 4- 3+  4 4  4+  Knee extension 3+ 4 4- 4 painful hip 4+ 4+  4- 4+  Ankle dorsiflexion 3+ 4- 4- 4-   4   Ankle plantarflexion            Ankle inversion            Ankle eversion             (Blank rows = not tested)   FUNCTIONAL TESTS:   Eval: Timed up and go (TUG): 1 min, 27 seconds   09/27/22: 5x sit to stand: 29 seconds with use of Lt hand  TUG: 27 seconds   10/30/22: 5x sit to stand: 23 sec use of LT hand     TUG: 26 sec with standard cane   11/20/22: 5x sit to stand: 18.68 seconds    TUG: 19.45 without device    3 minute walk test: 225 feet with a standard cane   01/10/23: 5x sit to stand: 15.77 seconds   01/17/23: TUG with cane: 15.41 seconds with cane    3 min walk test: 313 feet with cane    03/07/23: 3 min walk 275 feet with single point cane   TUG: 16 seconds   03/14/23: 6 min walk test: 723 feet with standard cane    TUG: 14.16 without cane    5x sit to stand: 12.15 seconds without hand   03/28/23 TUG 11 seconds without cane  5x STS 10.6 seconds no Ues  04/18/2023: 6 min walk test:  532 ft with [facility]  05/09/23: 6 min walk test with SPC: 667 feet - RPE 5  05/23/23: TUG: 11 seconds without cane 5x sit to stand: 12.71 seconds  6 min walk test: 763 feet  06/06/23:  5x sit to stand: 11.98 seconds    07/11/23:  5x sit to stand: 11.47 seconds   Short Physical Performance Battery:    Balance:       Side by side stance:    points (1)-1  Semi -tandem:     points (1)-1  Tandem:      points (2)-2    Gait Speed: (10m=9.84 feet) done 2x take the best time:   points                                           6.31 seconds-2 pts     Repeated Chair Stands:      points   (timer stopped when straight on 5th stand)                                             9.69 seconds- 4 pts  Total score=  10/12  points <10/12 predictive of 1 or more mobility limitations and increased risk of mobility disability 6 or less/12 associated with a higher fall rate         07/18/23: 6 min walk test-674 feet           08/22/23:  3 meter test: 4.91 seconds-  still 2 pts on SPPB  09/12/23: 6 min walk test: 594  feet- with gait belt and single point cane  09/12/23:  3 meter walk: 4.32- 3 pts on SPPB  Repeated chair stands: 9.7 seconds  SPPB 11/12                                                                 TODAY'S TREATMENT:   DATE:  10/01/23 Nustep level 3 x10 minutes with LE only >55 spm PT present throughout to discuss status Low trunk rotation: 3x20 seconds  Seated hamstring stretch 2x20 seconds, adduction bias 2x10 Weaving in and out of cones: gait belt used  Sit to stand with 6# weight: 2x10  Leg Press (seat at 5) 60# 2x10, single leg: Lt 40#, Rt 15# 2x10 bil each- continues to be challenged by weight Gait around building: emphasis on symmetry and heel strike bil- improved pattern today and didn't fatigue as quickly  DATE:  09/24/23 Nustep level 3 x10 minutes with LE only >55 spm PT present throughout to discuss status Tested objective measures 11/12 on SPPB Seated hamstring stretch 2x20 seconds, adduction bias 2x10 Weaving in and out of cones: gait belt used  Sit to stand with 6# weight: 2x10  Leg Press (seat at 5) 60# 2x10, single leg: Lt 40#, Rt 15# 2x10 bil each- continues to be challenged by weight  DATE:  09/19/23 Nustep level 3 x10 minutes with LE only >55 spm PT present throughout to discuss status Seated hamstring stretch 2x20 seconds, adduction bias 2x10 Supine butterfly stretch 3x20 seconds  Supine abduction with yellow band: Rt x10-challenged with this Sit to stand with 6# weight: 2x10  Leg Press (seat at 5) 60# 2x10, single leg: Lt 40#, Rt 15# 2x10 bil each- continues to be challenged by weight   PATIENT EDUCATION:  Education details: Access Code: [email or web address] Person educated: Patient Education method: Explanation, Demonstration, and Handouts Education comprehension: verbalized understanding and returned demonstration   HOME EXERCISE PROGRAM: Access Code: [email or web address] URL: [email or web address] Date:  09/19/2023 Prepared by: Tresa Endo  Exercises - Seated Long Arc Quad  - 3 x daily - 7 x weekly - 2 sets - 10 reps - 5 hold - Seated Heel Toe Raises   - 3 x daily - 7 x weekly - 2 sets - 10 reps - Seated Isometric Hip Adduction with Ball  - 3 x daily - 7 x weekly - 2 sets - 10 reps - Sit to Stand with Armchair  - 2 x daily - 7 x weekly - 2 sets - 5-10 reps - Heel Raises with Counter Support  - 2 x daily - 7 x weekly - 2 sets - 10 reps - Standing March with Counter Support  - 2 x daily - 7 x weekly - 10 reps - Supine Butterfly Groin Stretch  - 2 x daily - 7 x weekly - 1 sets - 3 reps - 20 hold - Seated Hamstring Stretch  - 2 x daily - 7 x weekly - 1 sets - 3 reps - 20 hold - Seated Hip Abduction with Resistance  - 1 x daily - 7 x weekly - 1-2 sets - 10 reps - Hooklying Isometric Clamshell  - 1 x daily - 7 x weekly - 1-2 sets - 10 reps  Patient Education - Walking with a Single [employer] -  Modified 4 Point Gait Pattern  ASSESSMENT:   CLINICAL IMPRESSION:     Pt has less pain in her Rt hip after doing the stretches that she has been prescribed.  Pt is as active as she is able and PT will work over the next month to finalize her HEP for independence with progression after D/C.  SPPB test is improved last week from 10/12 to 11/12, indicating reduced mobility deficits. Pt is more independent with getting her leg on to the leg press independently.  Improved symmetry and heel strike bilaterally today and showed less fatigue with full loop around the building today.  Slow progress due to chronic condition and deficits associated with stroke. Patient will benefit from skilled PT to address the below impairments and improve overall function.   OBJECTIVE IMPAIRMENTS: Abnormal gait, decreased activity tolerance, decreased balance, decreased mobility, difficulty walking, decreased strength, decreased safety awareness, impaired perceived functional ability, impaired flexibility, postural dysfunction, and pain.     ACTIVITY LIMITATIONS: carrying, lifting, sitting, standing, stairs, transfers, hygiene/grooming, and locomotion level   PARTICIPATION LIMITATIONS: meal prep, cleaning, laundry, driving, shopping, and community activity   PERSONAL FACTORS: Age, Past/current experiences, and 1-2 comorbidities: CVA, falls, Rt hip fracture with ORIF  are also affecting patient's functional outcome.    REHAB POTENTIAL: Good   CLINICAL DECISION MAKING: Evolving/moderate complexity   EVALUATION COMPLEXITY: Moderate     GOALS: Goals reviewed with patient? Yes   SHORT TERM GOALS: Target date: 09/06/2022   Be independent in initial HEP Baseline: Goal status: Goal met 08/14/22   2.  Improve LE strength to perform sit to stand with moderate Lt UE support Baseline:  Goal status: Goal met 08/28/22  3.  Perform TUG in < or = to 60 seconds to reduce falls risk Baseline: 27 seconds (09/27/22) Goal status: MET      LONG TERM GOALS: Target date: 11/02/23   Be independent in advanced HEP for continued gains after D/C Baseline: advanced last session and will advance again before D/C in 4 weeks (09/24/23) Goal status: in progress    2.  Perform TUG in < or = to 13 seconds to reduce falls risk Baseline: 11 seconds without cane (05/23/23) Goal status: met   3.  ambulate > or = to 750 feet in 6 minutes to improve community ambulation and independence  Baseline: this has fluctuated between 674-763 feet- plateau with this- 09/24/23 Goal status: deferred      4.  perform 5x sit to stand in < or = to 12 seconds to reduce falls risk  Baseline: 11.47 seconds (07/11/23)  Goal Status: MET  5. Improve balance to walk her dog short distances with independence   Baseline: walking with dog now, slowly (07/18/23)  Goal status: MET      6. Improve gait speed to perform 71m walk in < or = to 3.6 seconds    Baseline: 4.91 seconds (09/24/23)    Goal Status: in progress   PLAN:   PT FREQUENCY: 1x/week   PT  DURATION: 4 weeks   PLANNED INTERVENTIONS: Therapeutic exercises, Therapeutic activity, Neuromuscular re-education, Balance training, Gait training, Patient/Family education, Self Care, Joint mobilization, Stair training, Dry Needling, Electrical stimulation, Cryotherapy, Moist heat, Taping, Manual therapy, and Re-evaluation   PLAN FOR NEXT SESSION:   Strength, gait, and balance.    Lorrene Reid, PT 10/01/23 2:45 PM   [facility] [address], [address] [place], [place] [address] Phone # [phone] Fax [phone]

## 2023-10-04 ENCOUNTER — Ambulatory Visit (INDEPENDENT_AMBULATORY_CARE_PROVIDER_SITE_OTHER): Payer: Medicare PPO

## 2023-10-04 DIAGNOSIS — I639 Cerebral infarction, unspecified: Secondary | ICD-10-CM

## 2023-10-04 LAB — CUP PACEART REMOTE DEVICE CHECK
Date Time Interrogation Session: 20250102004800
Implantable Pulse Generator Implant Date: 20230815
Pulse Gen Serial Number: 183424

## 2023-10-10 ENCOUNTER — Ambulatory Visit: Payer: Medicare PPO | Attending: Family Medicine

## 2023-10-10 DIAGNOSIS — R2681 Unsteadiness on feet: Secondary | ICD-10-CM | POA: Insufficient documentation

## 2023-10-10 DIAGNOSIS — M6281 Muscle weakness (generalized): Secondary | ICD-10-CM | POA: Diagnosis not present

## 2023-10-10 DIAGNOSIS — R2689 Other abnormalities of gait and mobility: Secondary | ICD-10-CM | POA: Diagnosis not present

## 2023-10-10 NOTE — Therapy (Signed)
 OUTPATIENT PHYSICAL THERAPY TREATMENT NOTE   Patient Name: Angela Buchanan MRN: [id number] DOB:[birth year], 73 y.o., female Today's Date: 10/10/2023  PCP:  Chrystal Collar, MD   REFERRING PROVIDER:  Chrystal Collar, MD        END OF SESSION:   PT End of Session - 10/10/23 1525     Visit Number 56    Date for PT Re-Evaluation 11/02/23    Authorization Type [employer]    Authorization Time Period 4 visits-12-30/2024-11/02/2023    Authorization - Visit Number 2    Authorization - Number of Visits 4    Progress Note Due on Visit 60    PT Start Time 1442    PT Stop Time 1527    PT Time Calculation (min) 45 min    Equipment Utilized During Treatment Gait belt    Activity Tolerance Patient tolerated treatment well    Behavior During Therapy WFL for tasks assessed/performed                                    Past Medical History:  Diagnosis Date   Atypical chest pain 07/19/2021   Bigeminy    no current med.   Breast cancer (HCC) 06/2017   right   Bruises easily    CAD in native artery 10/31/2022   Carotid bruit 11/13/2019   Dental crowns present    History of diverticulitis    Hypertension    states under control with med., has been on med. x 5 yr.   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   Past Surgical History:  Procedure Laterality Date   ABDOMINAL HYSTERECTOMY     partial   BREAST LUMPECTOMY Right 06/22/2017   Malignant   BREAST LUMPECTOMY WITH RADIOACTIVE SEED AND SENTINEL LYMPH NODE BIOPSY Right 06/22/2017   Procedure: RIGHT BREAST LUMPECTOMY WITH RADIOACTIVE SEED AND RIGHT AXILLARY DEEP SENTINEL LYMPH NODE BIOPSY WITH BLUE DYE INJECTION;  Surgeon: Gail Favorite, MD;   Location: [facility];  Service: General;  Laterality: Right;   BREAST LUMPECTOMY WITH RADIOACTIVE SEED LOCALIZATION Left 06/22/2017   Procedure: LEFT BREAST LUMPECTOMY WITH RADIOACTIVE SEED LOCALIZATION;  Surgeon: Gail Favorite, MD;  Location: [facility];  Service: General;  Laterality: Left;   CATARACT EXTRACTION W/ INTRAOCULAR LENS  IMPLANT, BILATERAL Bilateral    EXCISION OF BREAST BIOPSY Left 06/22/2017   benign   LOOP RECORDER INSERTION N/A 05/16/2022   Procedure: LOOP RECORDER INSERTION;  Surgeon: Cindie Ole DASEN, MD;  Location: [facility];  Service: Cardiovascular;  Laterality: N/A;   PERCUTANEOUS PINNING Right 06/22/2022   Procedure: PERCUTANEOUS PINNING OF RIGHT HIP;  Surgeon: Cristy Bonner DASEN, MD;  Location: [facility];  Service: Orthopedics;  Laterality: Right;   TONSILLECTOMY     age 65   Patient Active Problem List   Diagnosis Date Noted   Right spastic hemiplegia (HCC) 12/14/2022   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   CAD in native artery 10/31/2022   Malnutrition of moderate degree 06/22/2022   Closed fracture of femur, neck (HCC) 06/21/2022   Urinary frequency 06/21/2022   Hypokalemia 06/21/2022   Heart murmur 06/21/2022   Hypothyroidism 06/21/2022  Dysphagia 05/17/2022   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 05/11/2020   PVC (premature ventricular contraction) 05/11/2020   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   Carotid bruit 11/13/2019   Pure hypercholesterolemia 11/13/2019   Malignant neoplasm of upper-outer quadrant of right breast in female, estrogen receptor positive (HCC) 05/22/2017   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   Diplopia 09/17/2014   Dizziness and giddiness  09/17/2014   Essential hypertension 07/22/2014   Chest pain 07/22/2014   Family history of heart disease 07/22/2014    REFERRING DIAG:  R29.6 (ICD-10-CM) - Repeated falls, closed fracture of Rt hip    THERAPY DIAG:  Muscle weakness (generalized)  Unsteadiness on feet  Other abnormalities of gait and mobility  Rationale for Evaluation and Treatment Rehabilitation  PERTINENT HISTORY: hypertension, breast cancer, hyperlipidemia, Lt hip fracture secondary to fall 06/2022, CVA 06/2022   PRECAUTIONS:  Fall, Rt breast cancer with node removal, osteoporosis    SUBJECTIVE:                                                                                                                                                                                     SUBJECTIVE STATEMENT:  I am having Rt hip pain with going down the steps.  It isn't all the time.   Pt spouse present during visit.   PAIN: 10/10/23 PAIN:  Are you having pain? Yes 0-4/10 Rt hip pain-lateral Aggravated by standing and walking Reduced with stretching and rest  OBJECTIVE: (objective measures completed at initial evaluation unless otherwise dated)  DIAGNOSTIC FINDINGS: Rt hip fracture and ORIF    COGNITION: Overall cognitive status: Within functional limits for tasks assessed                         SENSATION: WFL  POSTURE: rounded shoulders, forward head, flexed trunk , and weight shift left   PALPATION: NA   LOWER EXTREMITY ROM: Rt hip limited by 50%, hamstrings limited by 50% bil.    LOWER EXTREMITY MMT:   MMT Right eval Right  09/27/22 Left eval Right 10/30/22 Right  01/17/23 Right 05/23/23 Left 10/30/22 Left 01/17/23  Hip flexion 3+ 4- 4- 4- 4 4 4- 4+  Hip extension            Hip abduction 3-  4     4  Hip adduction            Hip internal rotation  Hip external rotation            Knee flexion 3- 4- 3+  4 4  4+  Knee extension 3+ 4 4- 4 painful hip 4+ 4+ 4- 4+  Ankle dorsiflexion 3+ 4-  4- 4-   4   Ankle plantarflexion            Ankle inversion            Ankle eversion             (Blank rows = not tested)   FUNCTIONAL TESTS:   Eval: Timed up and go (TUG): 1 min, 27 seconds   09/27/22: 5x sit to stand: 29 seconds with use of Lt hand  TUG: 27 seconds   10/30/22: 5x sit to stand: 23 sec use of LT hand     TUG: 26 sec with standard cane   11/20/22: 5x sit to stand: 18.68 seconds    TUG: 19.45 without device    3 minute walk test: 225 feet with a standard cane   01/10/23: 5x sit to stand: 15.77 seconds   01/17/23: TUG with cane: 15.41 seconds with cane    3 min walk test: 313 feet with cane    03/07/23: 3 min walk 275 feet with single point cane   TUG: 16 seconds   03/14/23: 6 min walk test: 723 feet with standard cane    TUG: 14.16 without cane    5x sit to stand: 12.15 seconds without hand   03/28/23 TUG 11 seconds without cane  5x STS 10.6 seconds no Ues  04/18/2023: 6 min walk test:  532 ft with [facility]  05/09/23: 6 min walk test with SPC: 667 feet - RPE 5  05/23/23: TUG: 11 seconds without cane 5x sit to stand: 12.71 seconds  6 min walk test: 763 feet  06/06/23:  5x sit to stand: 11.98 seconds    07/11/23:  5x sit to stand: 11.47 seconds   Short Physical Performance Battery:    Balance:       Side by side stance:    points (1)-1  Semi -tandem:     points (1)-1  Tandem:      points (2)-2    Gait Speed: (28m=9.84 feet) done 2x take the best time:   points                                           6.31 seconds-2 pts     Repeated Chair Stands:      points   (timer stopped when straight on 5th stand)                                             9.69 seconds- 4 pts  Total score=  10/12  points <10/12 predictive of 1 or more mobility limitations and increased risk of mobility disability 6 or less/12 associated with a higher fall rate         07/18/23: 6 min walk test-674 feet           08/22/23:  3 meter test: 4.91 seconds- still 2 pts on SPPB  09/12/23:  6 min walk test: 594 feet- with gait belt and single point cane  09/12/23:  3 meter walk:  4.32- 3 pts on SPPB  Repeated chair stands: 9.7 seconds  SPPB 11/12                                                                 TODAY'S TREATMENT:   DATE:  10/10/23 Nustep level 3 x10 minutes with LE only >55 spm PT present throughout to discuss status Seated hamstring stretch 2x20 seconds, adduction bias 2x10 Weaving in and out of cones: gait belt used  Sit to stand with 6# weight: 2x10  Seated march with 5# added on top of thigh: 2x10 on Lt  Leg Press (seat at 5) 60# 2x10, single leg: Lt 40#, Rt 15# 2x10 bil each- continues to be challenged by weight Gait around building: emphasis on symmetry and heel strike bil- improved pattern today and didn't fatigue as quickly  DATE:  10/01/23 Nustep level 3 x10 minutes with LE only >55 spm PT present throughout to discuss status Low trunk rotation: 3x20 seconds  Seated hamstring stretch 2x20 seconds, adduction bias 2x10 Weaving in and out of cones: gait belt used  Sit to stand with 6# weight: 2x10  Leg Press (seat at 5) 60# 2x10, single leg: Lt 40#, Rt 15# 2x10 bil each- continues to be challenged by weight Gait around building: emphasis on symmetry and heel strike bil- improved pattern today and didn't fatigue as quickly  DATE:  09/24/23 Nustep level 3 x10 minutes with LE only >55 spm PT present throughout to discuss status Tested objective measures 11/12 on SPPB Seated hamstring stretch 2x20 seconds, adduction bias 2x10 Weaving in and out of cones: gait belt used  Sit to stand with 6# weight: 2x10  Leg Press (seat at 5) 60# 2x10, single leg: Lt 40#, Rt 15# 2x10 bil each- continues to be challenged by weight  PATIENT EDUCATION:  Education details: Access Code: [email or web address] Person educated: Patient Education method: Explanation, Demonstration, and Handouts Education comprehension: verbalized understanding and returned demonstration   HOME EXERCISE  PROGRAM: Access Code: [email or web address] URL: [email or web address] Date: 09/19/2023 Prepared by: Burnard  Exercises - Seated Long Arc Quad  - 3 x daily - 7 x weekly - 2 sets - 10 reps - 5 hold - Seated Heel Toe Raises   - 3 x daily - 7 x weekly - 2 sets - 10 reps - Seated Isometric Hip Adduction with Ball  - 3 x daily - 7 x weekly - 2 sets - 10 reps - Sit to Stand with Armchair  - 2 x daily - 7 x weekly - 2 sets - 5-10 reps - Heel Raises with Counter Support  - 2 x daily - 7 x weekly - 2 sets - 10 reps - Standing March with Counter Support  - 2 x daily - 7 x weekly - 10 reps - Supine Butterfly Groin Stretch  - 2 x daily - 7 x weekly - 1 sets - 3 reps - 20 hold - Seated Hamstring Stretch  - 2 x daily - 7 x weekly - 1 sets - 3 reps - 20 hold - Seated Hip Abduction with Resistance  - 1 x daily - 7 x weekly - 1-2 sets - 10 reps - Hooklying Isometric Clamshell  - 1 x daily - 7 x weekly - 1-2 sets - 10 reps  Patient Education - Walking with a Single Directv - Modified 4 Point Gait Pattern  ASSESSMENT:   CLINICAL IMPRESSION:       Pt is as active as she is able and PT will work over the next 2-3 weeks to finalize her HEP for independence with progression after D/C.  Pt continues to be more independent with getting her leg on to the leg press without assistance.  Improved symmetry and heel strike bilaterally today and showed less fatigue with full loop around the building again today.  Slow progress due to chronic condition and deficits associated with stroke. Patient will benefit from skilled PT to address the below impairments and improve overall function.   OBJECTIVE IMPAIRMENTS: Abnormal gait, decreased activity tolerance, decreased balance, decreased mobility, difficulty walking, decreased strength, decreased safety awareness, impaired perceived functional ability, impaired flexibility, postural dysfunction, and pain.    ACTIVITY LIMITATIONS: carrying, lifting, sitting, standing,  stairs, transfers, hygiene/grooming, and locomotion level   PARTICIPATION LIMITATIONS: meal prep, cleaning, laundry, driving, shopping, and community activity   PERSONAL FACTORS: Age, Past/current experiences, and 1-2 comorbidities: CVA, falls, Rt hip fracture with ORIF  are also affecting patient's functional outcome.    REHAB POTENTIAL: Good   CLINICAL DECISION MAKING: Evolving/moderate complexity   EVALUATION COMPLEXITY: Moderate     GOALS: Goals reviewed with patient? Yes   SHORT TERM GOALS: Target date: 09/06/2022   Be independent in initial HEP Baseline: Goal status: Goal met 08/14/22   2.  Improve LE strength to perform sit to stand with moderate Lt UE support Baseline:  Goal status: Goal met 08/28/22  3.  Perform TUG in < or = to 60 seconds to reduce falls risk Baseline: 27 seconds (09/27/22) Goal status: MET      LONG TERM GOALS: Target date: 11/02/23   Be independent in advanced HEP for continued gains after D/C Baseline: advanced last session and will advance again before D/C in 4 weeks (09/24/23) Goal status: in progress    2.  Perform TUG in < or = to 13 seconds to reduce falls risk Baseline: 11 seconds without cane (05/23/23) Goal status: met   3.  ambulate > or = to 750 feet in 6 minutes to improve community ambulation and independence  Baseline: this has fluctuated between 674-763 feet- plateau with this- 09/24/23 Goal status: deferred      4.  perform 5x sit to stand in < or = to 12 seconds to reduce falls risk  Baseline: 11.47 seconds (07/11/23)  Goal Status: MET  5. Improve balance to walk her dog short distances with independence   Baseline: walking with dog now, slowly (07/18/23)  Goal status: MET      6. Improve gait speed to perform 30m walk in < or = to 3.6 seconds    Baseline: 4.91 seconds (09/24/23)    Goal Status: in progress   PLAN:   PT FREQUENCY: 1x/week   PT DURATION: 4 weeks   PLANNED INTERVENTIONS: Therapeutic exercises,  Therapeutic activity, Neuromuscular re-education, Balance training, Gait training, Patient/Family education, Self Care, Joint mobilization, Stair training, Dry Needling, Electrical stimulation, Cryotherapy, Moist heat, Taping, Manual therapy, and Re-evaluation   PLAN FOR NEXT SESSION:   Strength, gait, and balance.    Burnard Joy, PT 10/10/23 3:31 PM   [facility] [address] [place], [place] [address] Phone # [phone] Fax [phone]

## 2023-10-17 ENCOUNTER — Ambulatory Visit: Payer: Medicare PPO

## 2023-10-17 DIAGNOSIS — R2681 Unsteadiness on feet: Secondary | ICD-10-CM | POA: Diagnosis not present

## 2023-10-17 DIAGNOSIS — M6281 Muscle weakness (generalized): Secondary | ICD-10-CM

## 2023-10-17 DIAGNOSIS — R2689 Other abnormalities of gait and mobility: Secondary | ICD-10-CM

## 2023-10-17 NOTE — Therapy (Signed)
 OUTPATIENT PHYSICAL THERAPY TREATMENT NOTE   Patient Name: Angela Buchanan MRN: [id number] DOB:1951/02/16, 73 y.o., female Today's Date: 10/17/2023  PCP:  Denna Fish, MD   REFERRING PROVIDER:  Denna Fish, MD        END OF SESSION:   PT End of Session - 10/17/23 1458     Visit Number 57    Authorization Time Period 4 visits-12-30/2024-11/02/2023    Authorization - Visit Number 3    Authorization - Number of Visits 4    PT Start Time 1403    PT Stop Time 1447    PT Time Calculation (min) 44 min    Equipment Utilized During Treatment Gait belt    Activity Tolerance Patient tolerated treatment well    Behavior During Therapy [facility] for tasks assessed/performed                                     Past Medical History:  Diagnosis Date   Atypical chest pain 07/19/2021   Bigeminy    no current med.   Breast cancer (HCC) 06/2017   right   Bruises easily    CAD in native artery 10/31/2022   Carotid bruit 11/13/2019   Dental crowns present    History of diverticulitis    Hypertension    states under control with med., has been on med. x 5 yr.   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   Past Surgical History:  Procedure Laterality Date   ABDOMINAL HYSTERECTOMY     partial   BREAST LUMPECTOMY Right 06/22/2017   Malignant   BREAST LUMPECTOMY WITH RADIOACTIVE SEED AND SENTINEL LYMPH NODE BIOPSY Right 06/22/2017   Procedure: RIGHT BREAST LUMPECTOMY WITH RADIOACTIVE SEED AND RIGHT AXILLARY DEEP SENTINEL LYMPH NODE BIOPSY WITH BLUE DYE INJECTION;  Surgeon: Boyce Byes, MD;  Location: [facility];  Service: General;  Laterality: Right;   BREAST LUMPECTOMY WITH RADIOACTIVE  SEED LOCALIZATION Left 06/22/2017   Procedure: LEFT BREAST LUMPECTOMY WITH RADIOACTIVE SEED LOCALIZATION;  Surgeon: Boyce Byes, MD;  Location: [facility];  Service: General;  Laterality: Left;   CATARACT EXTRACTION W/ INTRAOCULAR LENS  IMPLANT, BILATERAL Bilateral    EXCISION OF BREAST BIOPSY Left 06/22/2017   benign   LOOP RECORDER INSERTION N/A 05/16/2022   Procedure: LOOP RECORDER INSERTION;  Surgeon: Boyce Byes, MD;  Location: [facility];  Service: Cardiovascular;  Laterality: N/A;   PERCUTANEOUS PINNING Right 06/22/2022   Procedure: PERCUTANEOUS PINNING OF RIGHT HIP;  Surgeon: Micheline Ahr, MD;  Location: [facility];  Service: Orthopedics;  Laterality: Right;   TONSILLECTOMY     age 24   Patient Active Problem List   Diagnosis Date Noted   Right spastic hemiplegia (HCC) 12/14/2022   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   CAD in native artery 10/31/2022   Malnutrition of moderate degree 06/22/2022   Closed fracture of femur, neck (HCC) 06/21/2022   Urinary frequency 06/21/2022   Hypokalemia 06/21/2022   Heart murmur 06/21/2022   Hypothyroidism 06/21/2022   Dysphagia 05/17/2022   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   CVA (cerebral vascular accident) (HCC) 05/12/2022   Osteoporosis 01/24/2022   Atypical  chest pain 07/19/2021   Temporomandibular joint (TMJ) pain 07/16/2020   Referred otalgia of both ears 07/16/2020   PAC (premature atrial contraction) 05/11/2020   PVC (premature ventricular contraction) 05/11/2020   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   Carotid bruit 11/13/2019   Pure hypercholesterolemia 11/13/2019   Malignant neoplasm of upper-outer quadrant of right breast in female, estrogen receptor positive (HCC) 05/22/2017   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   Diplopia 09/17/2014   Dizziness and giddiness 09/17/2014   Essential hypertension 07/22/2014   Chest pain 07/22/2014   Family history of heart disease 07/22/2014     REFERRING DIAG:  R29.6 (ICD-10-CM) - Repeated falls, closed fracture of Rt hip    THERAPY DIAG:  Muscle weakness (generalized)  Unsteadiness on feet  Other abnormalities of gait and mobility  Rationale for Evaluation and Treatment Rehabilitation  PERTINENT HISTORY: hypertension, breast cancer, hyperlipidemia, Lt hip fracture secondary to fall 06/2022, CVA 06/2022   PRECAUTIONS:  Fall, Rt breast cancer with node removal, osteoporosis    SUBJECTIVE:                                                                                                                                                                                     SUBJECTIVE STATEMENT:  Ready for D/C to HEP.  I am going to keep exercising.   Pt spouse present during visit.   PAIN: 10/17/23 PAIN:  Are you having pain? Yes 0-4/10 Rt hip pain-lateral Aggravated by standing and walking Reduced with stretching and rest  OBJECTIVE: (objective measures completed at initial evaluation unless otherwise dated)  DIAGNOSTIC FINDINGS: Rt hip fracture and ORIF    COGNITION: Overall cognitive status: Within functional limits for tasks assessed                         SENSATION: WFL  POSTURE: rounded shoulders, forward head, flexed trunk , and weight shift left   PALPATION: NA   LOWER EXTREMITY ROM: Rt hip limited by 50%, hamstrings limited by 50% bil.    LOWER EXTREMITY MMT:   MMT Right eval Right  09/27/22 Left eval Right 10/30/22 Right  01/17/23 Right 05/23/23 Left 10/30/22 Left 01/17/23  Hip flexion 3+ 4- 4- 4- 4 4 4- 4+  Hip extension            Hip abduction 3-  4     4  Hip adduction            Hip internal rotation            Hip external rotation            Knee flexion 3-  4- 3+  4 4  4+  Knee extension 3+ 4 4- 4 painful hip 4+ 4+ 4- 4+  Ankle dorsiflexion 3+ 4- 4- 4-   4   Ankle plantarflexion            Ankle inversion            Ankle eversion             (Blank rows = not tested)    FUNCTIONAL TESTS:   Eval: Timed up and go (TUG): 1 min, 27 seconds   09/27/22: 5x sit to stand: 29 seconds with use of Lt hand  TUG: 27 seconds   10/30/22: 5x sit to stand: 23 sec use of LT hand     TUG: 26 sec with standard cane   11/20/22: 5x sit to stand: 18.68 seconds    TUG: 19.45 without device    3 minute walk test: 225 feet with a standard cane   01/10/23: 5x sit to stand: 15.77 seconds   01/17/23: TUG with cane: 15.41 seconds with cane    3 min walk test: 313 feet with cane    03/07/23: 3 min walk 275 feet with single point cane   TUG: 16 seconds   03/14/23: 6 min walk test: 723 feet with standard cane    TUG: 14.16 without cane    5x sit to stand: 12.15 seconds without hand   03/28/23 TUG 11 seconds without cane  5x STS 10.6 seconds no Ues  04/18/2023: 6 min walk test:  532 ft with [facility]  05/09/23: 6 min walk test with SPC: 667 feet - RPE 5  05/23/23: TUG: 11 seconds without cane 5x sit to stand: 12.71 seconds  6 min walk test: 763 feet  06/06/23:  5x sit to stand: 11.98 seconds    07/11/23:  5x sit to stand: 11.47 seconds   Short Physical Performance Battery:    Balance:       Side by side stance:    points (1)-1  Semi -tandem:     points (1)-1  Tandem:      points (2)-2    Gait Speed: (17m=9.84 feet) done 2x take the best time:   points                                           6.31 seconds-2 pts     Repeated Chair Stands:      points   (timer stopped when straight on 5th stand)                                             9.69 seconds- 4 pts  Total score=  10/12  points <10/12 predictive of 1 or more mobility limitations and increased risk of mobility disability 6 or less/12 associated with a higher fall rate         07/18/23: 6 min walk test-674 feet           08/22/23:  3 meter test: 4.91 seconds- still 2 pts on SPPB  09/12/23: 6 min walk test: 594 feet- with gait belt and single point cane  09/12/23:  3 meter walk: 4.32- 3 pts on SPPB  Repeated chair  stands: 9.7 seconds  SPPB 11/12  10/16/22 3  meter walk: 4.02 seconds                                                                 TODAY'S TREATMENT:   DATE:  10/17/23 Nustep level 3 x10 minutes with LE only >55 spm PT present throughout to discuss status Weaving in and out of cones: gait belt used  Sit to stand with 6# weight: 2x10  Step over hurdles with CGA on gait belt  Leg Press (seat at 5) 60# 2x10, single leg: Lt 40#, Rt 15# 2x10 bil each- continues to be challenged by weight Gait around building: emphasis on symmetry and heel strike bil- improved pattern today and didn't fatigue as quickly  DATE:  10/10/23 Nustep level 3 x10 minutes with LE only >55 spm PT present throughout to discuss status Seated hamstring stretch 2x20 seconds, adduction bias 2x10 Weaving in and out of cones: gait belt used  Sit to stand with 6# weight: 2x10  Seated march with 5# added on top of thigh: 2x10 on Lt  Leg Press (seat at 5) 60# 2x10, single leg: Lt 40#, Rt 15# 2x10 bil each- continues to be challenged by weight Gait around building: emphasis on symmetry and heel strike bil- improved pattern today and didn't fatigue as quickly  DATE:  10/01/23 Nustep level 3 x10 minutes with LE only >55 spm PT present throughout to discuss status Low trunk rotation: 3x20 seconds  Seated hamstring stretch 2x20 seconds, adduction bias 2x10 Weaving in and out of cones: gait belt used  Sit to stand with 6# weight: 2x10  Leg Press (seat at 5) 60# 2x10, single leg: Lt 40#, Rt 15# 2x10 bil each- continues to be challenged by weight Gait around building: emphasis on symmetry and heel strike bil- improved pattern today and didn't fatigue as quickly  PATIENT EDUCATION:  Education details: Access Code: [email or web address] Person educated: Patient Education method: Explanation, Demonstration, and Handouts Education comprehension: verbalized understanding and returned demonstration   HOME EXERCISE PROGRAM: Access Code:  [email or web address] URL: [email or web address] Date: 09/19/2023 Prepared by: Loetta Ringer  Exercises - Seated Long Arc Quad  - 3 x daily - 7 x weekly - 2 sets - 10 reps - 5 hold - Seated Heel Toe Raises   - 3 x daily - 7 x weekly - 2 sets - 10 reps - Seated Isometric Hip Adduction with Ball  - 3 x daily - 7 x weekly - 2 sets - 10 reps - Sit to Stand with Armchair  - 2 x daily - 7 x weekly - 2 sets - 5-10 reps - Heel Raises with Counter Support  - 2 x daily - 7 x weekly - 2 sets - 10 reps - Standing March with Counter Support  - 2 x daily - 7 x weekly - 10 reps - Supine Butterfly Groin Stretch  - 2 x daily - 7 x weekly - 1 sets - 3 reps - 20 hold - Seated Hamstring Stretch  - 2 x daily - 7 x weekly - 1 sets - 3 reps - 20 hold - Seated Hip Abduction with Resistance  - 1 x daily - 7 x weekly - 1-2 sets - 10 reps - Hooklying Isometric Clamshell  - 1 x daily - 7 x weekly - 1-2 sets -  10 reps  Patient Education - Walking with a Single [employer] - Modified 4 Point Gait Pattern  ASSESSMENT:   CLINICAL IMPRESSION:       Pt is ready for D/C to HEP.  She has reached a plateau at this time and is limited by Rt UE and LE weakness. Rt hip is no longer consistently painful and she continues to do her stretches. Pt with improved functional measures consistently since the start of care.  Patient will benefit from skilled PT to address the below impairments and improve overall function.   OBJECTIVE IMPAIRMENTS: Abnormal gait, decreased activity tolerance, decreased balance, decreased mobility, difficulty walking, decreased strength, decreased safety awareness, impaired perceived functional ability, impaired flexibility, postural dysfunction, and pain.    ACTIVITY LIMITATIONS: carrying, lifting, sitting, standing, stairs, transfers, hygiene/grooming, and locomotion level   PARTICIPATION LIMITATIONS: meal prep, cleaning, laundry, driving, shopping, and community activity   PERSONAL FACTORS: Age, Past/current  experiences, and 1-2 comorbidities: CVA, falls, Rt hip fracture with ORIF  are also affecting patient's functional outcome.    REHAB POTENTIAL: Good   CLINICAL DECISION MAKING: Evolving/moderate complexity   EVALUATION COMPLEXITY: Moderate     GOALS: Goals reviewed with patient? Yes   SHORT TERM GOALS: Target date: 09/06/2022   Be independent in initial HEP Baseline: Goal status: Goal met 08/14/22   2.  Improve LE strength to perform sit to stand with moderate Lt UE support Baseline:  Goal status: Goal met 08/28/22  3.  Perform TUG in < or = to 60 seconds to reduce falls risk Baseline: 27 seconds (09/27/22) Goal status: MET      LONG TERM GOALS: Target date: 11/02/23   Be independent in advanced HEP for continued gains after D/C Baseline: independent in current HEP Goal status: MET   2.  Perform TUG in < or = to 13 seconds to reduce falls risk Baseline: 11 seconds without cane (05/23/23) Goal status: met   3.  ambulate > or = to 750 feet in 6 minutes to improve community ambulation and independence  Baseline: this has fluctuated between 674-763 feet- plateau with this- 09/24/23 Goal status: deferred      4.  perform 5x sit to stand in < or = to 12 seconds to reduce falls risk  Baseline: 11.47 seconds (07/11/23)  Goal Status: MET  5. Improve balance to walk her dog short distances with independence   Baseline: walking with dog now, slowly (07/18/23)  Goal status: MET      6. Improve gait speed to perform 7m walk in < or = to 3.6 seconds    Baseline: 4.02 seconds (10/17/23)    Goal Status: in progress   PLAN: PHYSICAL THERAPY DISCHARGE SUMMARY  Visits from Start of Care: 57  Current functional level related to goals / functional outcomes: See above for current status.  Pt with antalgic gait due to Rt LE weakness.  She has HEP in place to address remaining deficits.    Remaining deficits: See above.     Education / Equipment: HEP, fall prevention    Patient agrees to discharge. Patient goals were partially met. Patient is being discharged due to being pleased with the current functional level.    Luella Sager, PT 10/17/23 3:06 PM   [facility] [address], [address] [place], [place] [address] Phone # [phone] Fax [phone]

## 2023-10-18 ENCOUNTER — Telehealth (HOSPITAL_BASED_OUTPATIENT_CLINIC_OR_DEPARTMENT_OTHER): Payer: Self-pay | Admitting: Cardiovascular Disease

## 2023-10-18 NOTE — Telephone Encounter (Signed)
Pt c/o medication issue:  1. Name of Medication:   metoprolol tartrate (LOPRESSOR) 25 MG tablet   2. How are you currently taking this medication (dosage and times per day)?   As prescribed  3. Are you having a reaction (difficulty breathing--STAT)?   4. What is your medication issue?   Patient stated her dosage of this medication was reduced to 1/2 tablet.  Patient stated she want to go back to the full tablet as she is still having chest pains.   Pt c/o of Chest Pain: STAT if active CP, including tightness, pressure, jaw pain, radiating pain to shoulder/upper arm/back, CP unrelieved by Nitro. Symptoms reported of SOB, nausea, vomiting, sweating.  1. Are you having CP right now?   Yes   2. Are you experiencing any other symptoms (ex. SOB, nausea, vomiting, sweating)?   No  3. Is your CP continuous or coming and going?   Continuous  4. Have you taken Nitroglycerin?     5. How long have you been experiencing CP?   A few weeks after she started taking the half dosage  6. If NO CP at time of call then end call with telling Pt to call back or call 911 if Chest pain returns prior to return call from triage team.

## 2023-10-18 NOTE — Telephone Encounter (Signed)
Spoke with pt, her metoprolol was decreased because of low heart rate and fatigue. She does not know what her heart rate has been running but she does report an improvement in the energy level. The chest pain can come on at rest or activity and she reports it is the same she had prior to starting the metoprolol and since the decrease in the dose, the pain has returned. Aware will forward to dr Duke Salvia for her recommendations. She is currently pain free.

## 2023-10-19 MED ORDER — METOPROLOL TARTRATE 25 MG PO TABS: 25.0000 mg | ORAL_TABLET | Freq: Every day | ORAL | 3 refills | Status: DC

## 2023-10-19 NOTE — Telephone Encounter (Signed)
Pt returning call

## 2023-10-19 NOTE — Telephone Encounter (Signed)
Spoke with pt, aware okay to increase metoprolol back to 25 mg daily as before. New script sent to the pharmacy

## 2023-10-19 NOTE — Telephone Encounter (Signed)
Unable to reach pt or leave a message  

## 2023-10-29 DIAGNOSIS — R7989 Other specified abnormal findings of blood chemistry: Secondary | ICD-10-CM | POA: Diagnosis not present

## 2023-10-30 ENCOUNTER — Telehealth: Payer: Self-pay | Admitting: Cardiovascular Disease

## 2023-10-30 DIAGNOSIS — G8929 Other chronic pain: Secondary | ICD-10-CM | POA: Diagnosis not present

## 2023-10-30 DIAGNOSIS — M25511 Pain in right shoulder: Secondary | ICD-10-CM | POA: Diagnosis not present

## 2023-10-30 DIAGNOSIS — M7061 Trochanteric bursitis, right hip: Secondary | ICD-10-CM | POA: Diagnosis not present

## 2023-10-30 LAB — HEPATIC FUNCTION PANEL
ALT: 18 [IU]/L (ref 0–32)
AST: 29 [IU]/L (ref 0–40)
Albumin: 4.2 g/dL (ref 3.8–4.8)
Alkaline Phosphatase: 126 [IU]/L — ABNORMAL HIGH (ref 44–121)
Bilirubin Total: 0.3 mg/dL (ref 0.0–1.2)
Bilirubin, Direct: 0.15 mg/dL (ref 0.00–0.40)
Total Protein: 6.5 g/dL (ref 6.0–8.5)

## 2023-10-30 NOTE — Telephone Encounter (Signed)
Will forward to the pharmacist to review

## 2023-10-30 NOTE — Telephone Encounter (Signed)
Rameka from [facility] is requesting a callback to see if they can prescribe pt Meloxicam. She stated if she doesn't answer then it's okay to leave a voicemail. Please advise

## 2023-10-31 ENCOUNTER — Telehealth (HOSPITAL_BASED_OUTPATIENT_CLINIC_OR_DEPARTMENT_OTHER): Payer: Self-pay

## 2023-10-31 NOTE — Telephone Encounter (Signed)
-----   Message from Alver Sorrow sent at 10/30/2023  5:11 PM EST ----- Liver function (ALT and AST) have normalized. Good result!   Mildly elevated alkaline phosphatase is stable from previous and mildly elevated. Continue to avoid fried or fatty foods.

## 2023-10-31 NOTE — Telephone Encounter (Signed)
Called and spoke to pt; discussed results. She verbalized understanding.   ----- Message from Alver Sorrow sent at 10/30/2023  5:11 PM EST ----- Liver function (ALT and AST) have normalized. Good result!    Mildly elevated alkaline phosphatase is stable from previous and mildly elevated. Continue to avoid fried or fatty foods.

## 2023-11-01 NOTE — Telephone Encounter (Signed)
Will defer to Dr. Duke Salvia but given her hx of CVA on plavix and hx of HTN I would recommend against.

## 2023-11-02 NOTE — Telephone Encounter (Signed)
Patient is requesting a call back to discuss results again.

## 2023-11-02 NOTE — Telephone Encounter (Signed)
Called and spoke to pt about results. Will mail lab results to pt as well.

## 2023-11-05 ENCOUNTER — Ambulatory Visit (INDEPENDENT_AMBULATORY_CARE_PROVIDER_SITE_OTHER): Payer: Medicare PPO

## 2023-11-05 DIAGNOSIS — I639 Cerebral infarction, unspecified: Secondary | ICD-10-CM | POA: Diagnosis not present

## 2023-11-05 LAB — CUP PACEART REMOTE DEVICE CHECK
Date Time Interrogation Session: 20250203015700
Implantable Pulse Generator Implant Date: 20230815
Pulse Gen Serial Number: 183424

## 2023-11-07 ENCOUNTER — Telehealth: Payer: Self-pay | Admitting: Family

## 2023-11-07 NOTE — Telephone Encounter (Signed)
Spoke with patient regarding PREP program Will reach out tomorrow to see if patient can participate

## 2023-11-07 NOTE — Telephone Encounter (Signed)
 Pt called in asking to inform Cailtin, NP that she tried to get into the PREP program but they do not accept her insurance. She asked for any other suggestions?

## 2023-11-07 NOTE — Telephone Encounter (Signed)
 Left message to call back

## 2023-11-07 NOTE — Telephone Encounter (Signed)
 Patient returned RN's call.

## 2023-11-08 NOTE — Telephone Encounter (Signed)
 Emial sent to Howard Macho who is currently working with [employer] program

## 2023-11-12 NOTE — Telephone Encounter (Signed)
 Received message from [place], patient unable to use PREP secondary to not being in [facility]  Right Start program information below given to patient  Right Start Program at [facility]  Sessions include Structured exercise sessions 2 group sessions per week for 9 weeks Monitored by fitness app 20 to 40-minute sessions Post program complications such as a neck step.  It is free for [facility] Members or $99 for non-members. Financial assistance is available.  No referral required.  Please call [phone], visit [employer] in person, or register online at [email or web address]

## 2023-11-12 NOTE — Telephone Encounter (Signed)
 Routing to your box

## 2023-11-15 NOTE — Progress Notes (Signed)
[facility] Summary Report / Loop Recorder

## 2023-11-19 NOTE — Telephone Encounter (Signed)
Patient identification verified by 2 forms. Shade Flood, RN     Per DPR left detailed vm with Provider and pharmacy response regarding meloxicam.   Chilton Si, MD  Malena Peer D, RPH-CPP15 hours ago (10:36 PM)    Agree, I don't think that meloxicam is a good idea for her.  Roselind Messier, RPH-CPP routed conversation to Chilton Si, MD2 weeks ago   Malena Peer D, RPH-CPP2 weeks ago    Will defer to Dr. Duke Salvia but given her hx of CVA on plavix and hx of HTN I would recommend against.      Note   Freddi Starr, RN routed conversation to [employer] [facility] weeks ago

## 2023-12-03 DIAGNOSIS — M25559 Pain in unspecified hip: Secondary | ICD-10-CM | POA: Diagnosis not present

## 2023-12-03 DIAGNOSIS — M7501 Adhesive capsulitis of right shoulder: Secondary | ICD-10-CM | POA: Diagnosis not present

## 2023-12-10 ENCOUNTER — Ambulatory Visit (INDEPENDENT_AMBULATORY_CARE_PROVIDER_SITE_OTHER)

## 2023-12-10 DIAGNOSIS — I639 Cerebral infarction, unspecified: Secondary | ICD-10-CM | POA: Diagnosis not present

## 2023-12-10 LAB — CUP PACEART REMOTE DEVICE CHECK
Date Time Interrogation Session: 20250310010800
Implantable Pulse Generator Implant Date: 20230815
Pulse Gen Serial Number: 183424

## 2023-12-12 NOTE — Progress Notes (Signed)
 [facility]sx Loop Recorder

## 2023-12-18 ENCOUNTER — Other Ambulatory Visit (HOSPITAL_BASED_OUTPATIENT_CLINIC_OR_DEPARTMENT_OTHER): Payer: Self-pay | Admitting: Cardiovascular Disease

## 2023-12-18 DIAGNOSIS — R072 Precordial pain: Secondary | ICD-10-CM

## 2023-12-31 ENCOUNTER — Encounter: Payer: Self-pay | Admitting: Neurology

## 2023-12-31 ENCOUNTER — Ambulatory Visit: Payer: Medicare PPO | Admitting: Neurology

## 2023-12-31 VITALS — BP 129/60 | HR 56 | Ht 62.0 in | Wt 101.8 lb

## 2023-12-31 DIAGNOSIS — G8110 Spastic hemiplegia affecting unspecified side: Secondary | ICD-10-CM | POA: Diagnosis not present

## 2023-12-31 DIAGNOSIS — Z8673 Personal history of transient ischemic attack (TIA), and cerebral infarction without residual deficits: Secondary | ICD-10-CM | POA: Diagnosis not present

## 2023-12-31 DIAGNOSIS — R296 Repeated falls: Secondary | ICD-10-CM

## 2023-12-31 NOTE — Patient Instructions (Addendum)
 I  had a long d/w patient about remote stroke, spastic right hemiparesis and frequent falls backwards risk for recurrent stroke/TIAs, personally independently reviewed imaging studies and stroke evaluation results and answered questions.Continue clopidogrel 75 mg daily alone now and discontinue aspirin for secondary stroke prevention and maintain strict control of hypertension with blood pressure goal below 130/90, diabetes with hemoglobin A1c goal below 6.5% and lipids with LDL cholesterol goal below 70 mg/dL. I also advised the patient to eat a healthy diet with plenty of whole grains, cereals, fruits and vegetables, exercise regularly and maintain ideal body weight .continue ongoing physical and occupational therapy and ambulate using a cane at all times.  We also discussed fall safety precautions.  Check screening lipid profile and hemoglobin A1c.  Check MRI scan of the brain with MRA of the brain and neck to rule out interval new stroke.  We also discussed fall prevention precautions.  Return for followup in the future with me in 1 year r call earlier if necessary.  Fall Prevention in the Home, Adult Falls can cause injuries and affect people of all ages. There are many simple things that you can do to make your home safe and to help prevent falls. If you need it, ask for help making these changes. What actions can I take to prevent falls? General information Use good lighting in all rooms. Make sure to: Replace any light bulbs that burn out. Turn on lights if it is dark and use night-lights. Keep items that you use often in easy-to-reach places. Lower the shelves around your home if needed. Move furniture so that there are clear paths around it. Do not keep throw rugs or other things on the floor that can make you trip. If any of your floors are uneven, fix them. Add color or contrast paint or tape to clearly mark and help you see: Grab bars or handrails. First and last steps of  staircases. Where the edge of each step is. If you use a ladder or stepladder: Make sure that it is fully opened. Do not climb a closed ladder. Make sure the sides of the ladder are locked in place. Have someone hold the ladder while you use it. Know where your pets are as you move through your home. What can I do in the bathroom?     Keep the floor dry. Clean up any water that is on the floor right away. Remove soap buildup in the bathtub or shower. Buildup makes bathtubs and showers slippery. Use non-skid mats or decals on the floor of the bathtub or shower. Attach bath mats securely with double-sided, non-slip rug tape. If you need to sit down while you are in the shower, use a non-slip stool. Install grab bars by the toilet and in the bathtub and shower. Do not use towel bars as grab bars. What can I do in the bedroom? Make sure that you have a light by your bed that is easy to reach. Do not use any sheets or blankets on your bed that hang to the floor. Have a firm bench or chair with side arms that you can use for support when you get dressed. What can I do in the kitchen? Clean up any spills right away. If you need to reach something above you, use a sturdy step stool that has a grab bar. Keep electrical cables out of the way. Do not use floor polish or wax that makes floors slippery. What can I do with my stairs?  Do not leave anything on the stairs. Make sure that you have a light switch at the top and the bottom of the stairs. Have them installed if you do not have them. Make sure that there are handrails on both sides of the stairs. Fix handrails that are broken or loose. Make sure that handrails are as long as the staircases. Install non-slip stair treads on all stairs in your home if they do not have carpet. Avoid having throw rugs at the top or bottom of stairs, or secure the rugs with carpet tape to prevent them from moving. Choose a carpet design that does not hide the  edge of steps on the stairs. Make sure that carpet is firmly attached to the stairs. Fix any carpet that is loose or worn. What can I do on the outside of my home? Use bright outdoor lighting. Repair the edges of walkways and driveways and fix any cracks. Clear paths of anything that can make you trip, such as tools or rocks. Add color or contrast paint or tape to clearly mark and help you see high doorway thresholds. Trim any bushes or trees on the main path into your home. Check that handrails are securely fastened and in good repair. Both sides of all steps should have handrails. Install guardrails along the edges of any raised decks or porches. Have leaves, snow, and ice cleared regularly. Use sand, salt, or ice melt on walkways during winter months if you live where there is ice and snow. In the garage, clean up any spills right away, including grease or oil spills. What other actions can I take? Review your medicines with your health care provider. Some medicines can make you confused or feel dizzy. This can increase your chance of falling. Wear closed-toe shoes that fit well and support your feet. Wear shoes that have rubber soles and low heels. Use a cane, walker, scooter, or crutches that help you move around if needed. Talk with your provider about other ways that you can decrease your risk of falls. This may include seeing a physical therapist to learn to do exercises to improve movement and strength. Where to find more information Centers for Disease Control and Prevention, STEADI: TonerPromos.no General [place] on Aging: BaseRingTones.pl National Institute on Aging: BaseRingTones.pl Contact a health care provider if: You are afraid of falling at home. You feel weak, drowsy, or dizzy at home. You fall at home. Get help right away if you: Lose consciousness or have trouble moving after a fall. Have a fall that causes a head injury. These symptoms may be an emergency. Get help right away. Call  911. Do not wait to see if the symptoms will go away. Do not drive yourself to the hospital. This information is not intended to replace advice given to you by your health care provider. Make sure you discuss any questions you have with your health care provider. Document Revised: 05/22/2022 Document Reviewed: 05/22/2022 Elsevier Patient Education  2024 [employer].

## 2023-12-31 NOTE — Progress Notes (Signed)
 [facility] [address] [place]. [place] [address] [phone]       OFFICE FOLLOW-UP NOTE  [name] Date of Birth:  [birth year] Medical Record Number:  [id number]   HPI: Initial visit 06/05/2023 Angela Buchanan is a 73 year old lady seen today for initial office follow-up visit following hospital consultation for stroke in August 2023.  She is accompanied by her husband.  History is obtained from them and review of electronic medical records.  I personally reviewed pertinent available imaging films in PACS.  She has past medical history of hypertension, hyperlipidemia, coronary artery disease who presented on 05/12/2022 with sudden onset of right-sided weakness and difficulty speaking.  She developed sudden onset of difficulty using the right hand while writing and had trouble forming numbers.  This lasted about 30 minutes and she was not sure if this involves her speech as well but she did notice difficulty walking with right-sided weakness and numbness.  She had recurrent fluctuating symptoms which persisted and been evaluated in the emergency room she had mild right-sided weakness and numbness.  NIH stroke scale was 4.  She was given IV TNK and observed in the ICU and did well.  Code stroke CT head showed no acute abnormality with aspects score of 10.  CT angiogram head and neck showed no large vessel stenosis or occlusion.  Since she had fluctuating symptoms CT angiogram was repeated the next day which again showed no significant abnormality.  MRI scan of the brain showed acute large left corona radiator subcortical infarct involving lateral margin of the left lateral ventricle.  2D echo showed ejection fraction of 60 to 65%.  LDL cholesterol 167 mg percent.  Hemoglobin A1c was 5.7.  Patient's stroke was felt to be cryptogenic in etiology given the large size and she underwent loop recorder insertion which so far has not yet shown paroxysmal A-fib and last checked on 05/31/2023.   Patient was started on dual antiplatelet therapy of aspirin and Plavix for 3 weeks and then Plavix alone however she is continuing to take both of them and is tolerating it with minor bruising and no bleeding.  She initially went to inpatient rehab and did well and was discharged home but a week later she unfortunately fell and fractured her right hip requiring surgery and another prolonged stay in rehab.  She is currently at home.  She is doing outpatient physical and Occupational Therapy.  She ambulates with a cane.  She still has some mild right-sided weakness.  Her speech is much better though occasionally when she is tired she slurs some words.  She has fortunately had no recurrent falls.  She is tolerating aspirin and Plavix well with bruising but no bleeding.  She is tolerating for alcohol well without muscle aches and pains. Update 12/31/2023 : She returns for follow-up after last visit 6 months ago.  She is accompanied by her husband.  Patient states she is doing well.  She has had no recurrent stroke or TIA symptoms.  She continues to have spastic right hemiparesis.  She is able to ambulate with a cane.  She however has had frequent falls.  The falls are mostly backwards.  She is standing abruptly all of a sudden she tends to fall backwards.  Most of the time she has had a cane and managed to break the fall.  She denies any presyncopal symptoms or loss of vision prior to falls.  She did not lose consciousness.  She has not had  any neurovascular imaging done for evaluation.  She continues to take Plavix which is tolerating well with minor bruising and no bleeding.  She is tolerating Pravachol well without muscle aches and pains.  She did see Dr. Wynn Banker and tried Botox for spasticity but it did not help and she had a lot of side effects and she stopped it.  She was considered for redo stim program but did not qualify.  Loop recorder interrogation on 12/10/2023 showed no evidence of paroxysmal A-fib yet.   Carotid ultrasound on 06/11/2023 showed no significant extracranial stenosis.  Lipid profile on 08/27/2023 showed LDL-cholesterol to be optimal at 61 mg percent. ROS:   14 system review of systems is positive for weakness, stiffness, gait difficulty, slurred speech, bruising all other systems negative  PMH:  Past Medical History:  Diagnosis Date   Atypical chest pain 07/19/2021   Bigeminy    no current med.   Breast cancer (HCC) 06/2017   right   Bruises easily    CAD in native artery 10/31/2022   Carotid bruit 11/13/2019   Dental crowns present    History of diverticulitis    Hypertension    states under control with med., has been on med. x 5 yr.   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    Social History:  Social History   Socioeconomic History   Marital status: Married    Spouse name: Not on file   Number of children: 0   Years of education: college   Highest education level: Not on file  Occupational History   Occupation: [employer] features editor: [employer]  Tobacco Use   Smoking status: Never   Smokeless tobacco: Never  Vaping Use   Vaping status: Never Used  Substance and Sexual Activity   Alcohol use: No    Alcohol/week: 0.0 standard drinks of alcohol   Drug use: No   Sexual activity: Never    Birth control/protection: Surgical    Comment: hyst  Other Topics Concern   Not on file  Social History Narrative   Patient lives at home with husband  ([name]).    Patient is rtired caoll   Right handed.   Caffeine 8oz . Caffeine daily   Social Drivers of Corporate investment banker Strain: Not on file  Food Insecurity: No Food Insecurity (06/21/2022)   Hunger Vital Sign    Worried About Running Out of  Food in the Last Year: Never true    Ran Out of Food in the Last Year: Never true  Transportation Needs: No Transportation Needs (06/21/2022)   PRAPARE - Administrator, Civil Service (Medical): No    Lack of Transportation (Non-Medical): No  Physical Activity: Not on file  Stress: Not on file  Social Connections: Not on file  Intimate Partner Violence: Not At Risk (06/21/2022)   Humiliation, Afraid, Rape, and Kick questionnaire    Fear of Current or Ex-Partner: No    Emotionally Abused: No    Physically Abused: No    Sexually Abused: No    Medications:   Current Outpatient Medications on File Prior to Visit  Medication Sig Dispense Refill   amLODipine (NORVASC) 10 MG tablet Take 1 tablet (10 mg total) by mouth  daily. 90 tablet 3   calcium-vitamin D (OSCAL WITH D) 500-5 MG-MCG tablet Take 1 tablet by mouth daily with breakfast. 30 tablet 0   cetirizine (ZYRTEC) 10 MG tablet Take 10 mg by mouth daily.     Cholecalciferol (VITAMIN D) 50 MCG (2000 UT) tablet Take 2,000 Units by mouth daily.     clobetasol ointment (TEMOVATE) 0.05 % Apply topically 2 (two) times daily as needed (itching). (Patient taking differently: Apply 1 Application topically 2 (two) times daily as needed (itching).) 30 g 0   clopidogrel (PLAVIX) 75 MG tablet Take 1 tablet (75 mg total) by mouth daily. 18 tablet 0   docusate sodium (COLACE) 100 MG capsule Take 1 capsule (100 mg total) by mouth 2 (two) times daily. 10 capsule 0   escitalopram (LEXAPRO) 5 MG tablet Take 1 tablet (5 mg total) by mouth daily. 90 tablet 1   ezetimibe (ZETIA) 10 MG tablet Take 1 tablet (10 mg total) by mouth daily. 90 tablet 3   feeding supplement (ENSURE ENLIVE / ENSURE PLUS) LIQD Take 237 mLs by mouth 2 (two) times daily between meals. 237 mL 12   fluticasone (FLONASE) 50 MCG/ACT nasal spray Place 1 spray into both nostrils daily as needed for allergies or rhinitis.     HYDROcodone-acetaminophen (NORCO) 5-325 MG tablet Take 1  tablet by mouth every 6 (six) hours as needed for severe pain. 30 tablet 0   hydrOXYzine (ATARAX) 25 MG tablet Take 25 mg by mouth 3 (three) times daily as needed for nausea or vomiting.     isosorbide mononitrate (IMDUR) 30 MG 24 hr tablet Take 1 tablet by mouth once daily 90 tablet 0   levothyroxine (SYNTHROID) 25 MCG tablet Take 1 tablet (25 mcg total) by mouth daily at 6 (six) AM. 30 tablet 0   lisinopril (ZESTRIL) 40 MG tablet Take 1 tablet (40 mg total) by mouth daily. 90 tablet 3   meclizine (ANTIVERT) 25 MG tablet Take 25 mg by mouth daily as needed for dizziness.     methocarbamol (ROBAXIN) 500 MG tablet Take 1 tablet (500 mg total) by mouth every 6 (six) hours as needed for muscle spasms. 30 tablet 0   metoprolol tartrate (LOPRESSOR) 25 MG tablet Take 1 tablet (25 mg total) by mouth daily. 90 tablet 3   Multiple Vitamins-Minerals (CENTRUM SILVER 50+WOMEN PO) Take 1 tablet by mouth daily.     nystatin (MYCOSTATIN) 100000 UNIT/ML suspension Take 5 mLs (500,000 Units total) by mouth 4 (four) times daily. 60 mL 1   oxyCODONE-acetaminophen (PERCOCET) 5-325 MG tablet Take 1-2 tablets by mouth every 6 (six) hours as needed. 20 tablet 0   pantoprazole (PROTONIX) 40 MG tablet Take 1 tablet (40 mg total) by mouth daily at 12 noon. 30 tablet 0   polyethylene glycol (MIRALAX / GLYCOLAX) 17 g packet Take 17 g by mouth daily as needed for mild constipation. 14 each 0   potassium chloride SA (KLOR-CON M20) 20 MEQ tablet Take 1 tablet (20 mEq total) by mouth daily. Alternate 20 and tablets every other day 90 tablet 3   pravastatin (PRAVACHOL) 20 MG tablet Take 1 tablet (20 mg total) by mouth daily. 90 tablet 3   senna (SENOKOT) 8.6 MG TABS tablet Take 2 tablets (17.2 mg total) by mouth daily as needed for mild constipation. 120 tablet 0   No current facility-administered medications on file prior to visit.    Allergies:   Allergies  Allergen Reactions   Atenolol Other (See  Comments)    CHEST  PAIN   Bisoprolol Other (See Comments)    "FEELS WEIRD"   Prednisone Other (See Comments)    UNABLE TO FOCUS   Amoxicillin     unknown   Amoxicillin-Pot Clavulanate Diarrhea and Other (See Comments)   Hctz [Hydrochlorothiazide]     Hyponatremia    Rosuvastatin     myalgias   Latex Rash    Physical Exam General: well developed, well nourished pleasant elderly Caucasian lady, seated, in no evident distress Head: head normocephalic and atraumatic.  Neck: supple with no carotid or supraclavicular bruits Cardiovascular: regular rate and rhythm, no murmurs Musculoskeletal: no deformity Skin:  no rash/petichiae Vascular:  Normal pulses all extremities Vitals:   12/31/23 1247  BP: 129/60  Pulse: (!) 56   Neurologic Exam Mental Status: Awake and fully alert. Oriented to place and time. Recent and remote memory intact. Attention span, concentration and fund of knowledge appropriate. Mood and affect appropriate.  Mild dysarthria.  No aphasia. Cranial Nerves: Fundoscopic exam not done. Pupils equal, briskly reactive to light. Extraocular movements full without nystagmus. Visual fields full to confrontation. Hearing intact. Facial sensation intact.  Mild right lower facial weakness., tongue, palate moves normally and symmetrically.  Motor: Mild right upper extremity drift.  Weakness of right grip and intrinsic hand muscles.  Orbits left or right upper extremity.  Mild right hip flexor and ankle dorsiflexor weakness.  Increased tone on the right with mild spasticity.   Sensory.: intact to touch ,pinprick .position and vibratory sensation.  Coordination: Rapid alternating movements normal in all extremities. Finger-to-nose and heel-to-shin performed accurately bilaterally. Gait and Station: Arises from chair without difficulty. Stance is normal. Gait demonstrate spastic hemiplegic gait with mild right foot drop and stiffness of the right leg.  Poor truncal balance and falls backwards when  pushed. Reflexes: 2+ and asymmetric brisker on the right. Toes downgoing.   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  Vascular risk factors of hypertension and hyperlipidemia.  Increase frequency of abruptly falling backwards of unclear etiology needs further evaluation.     PLAN: I had a long d/w patient about remote stroke, spastic right hemiparesis and frequent falls backwards risk for recurrent stroke/TIAs, personally independently reviewed imaging studies and stroke evaluation results and answered questions.Continue clopidogrel 75 mg daily alone now and discontinue aspirin for secondary stroke prevention and maintain strict control of hypertension with blood pressure goal below 130/90, diabetes with hemoglobin A1c goal below 6.5% and lipids with LDL cholesterol goal below 70 mg/dL. I also advised the patient to eat a healthy diet with plenty of whole grains, cereals, fruits and vegetables, exercise regularly and maintain ideal body weight .continue ongoing physical and occupational therapy and ambulate using a cane at all times.  We also discussed fall safety precautions.  Check screening lipid profile and hemoglobin A1c.  Check MRI scan of the brain with MRA of the brain and neck to rule out interval new stroke.  We also discussed fall prevention precautions.  Return for followup in the future with me in 1 year r call earlier if necessary. Greater than 50% of time during this 40 minute visit was spent on counseling,explanation of diagnosis, planning of further management, discussion with patient and family and coordination of care Delia Heady, MD Note: This document was prepared with digital dictation and possible smart phrase technology. Any transcriptional errors  that result from this process are unintentional

## 2024-01-01 LAB — LIPID PANEL
Chol/HDL Ratio: 2.1 ratio (ref 0.0–4.4)
Cholesterol, Total: 184 mg/dL (ref 100–199)
HDL: 88 mg/dL (ref >39)
LDL Chol Calc (NIH): 80 mg/dL (ref 0–99)
Triglycerides: 92 mg/dL (ref 0–149)
VLDL Cholesterol Cal: 16 mg/dL (ref 5–40)

## 2024-01-01 LAB — HEMOGLOBIN A1C
Est. average glucose Bld gHb Est-mCnc: 126 mg/dL
Hgb A1c MFr Bld: 6 % — ABNORMAL HIGH (ref 4.8–5.6)

## 2024-01-02 ENCOUNTER — Telehealth: Payer: Self-pay | Admitting: Neurology

## 2024-01-02 DIAGNOSIS — M7501 Adhesive capsulitis of right shoulder: Secondary | ICD-10-CM | POA: Diagnosis not present

## 2024-01-02 NOTE — Telephone Encounter (Signed)
 Francine Graven Berkley Harvey: [id number] exp. 01/02/24-03/02/24 sent to [facility] [phone]

## 2024-01-03 DIAGNOSIS — M7501 Adhesive capsulitis of right shoulder: Secondary | ICD-10-CM | POA: Diagnosis not present

## 2024-01-04 ENCOUNTER — Telehealth: Payer: Self-pay

## 2024-01-04 DIAGNOSIS — M7501 Adhesive capsulitis of right shoulder: Secondary | ICD-10-CM | POA: Diagnosis not present

## 2024-01-04 NOTE — Progress Notes (Signed)
 Kindly inform the patient that screening test for diabetes as well as cholesterol profile are both borderline but acceptable.

## 2024-01-04 NOTE — Telephone Encounter (Signed)
-----   Message from Delia Heady sent at 01/04/2024  8:13 AM EDT ----- Joneen Roach inform the patient that screening test for diabetes as well as cholesterol profile are both borderline but acceptable.

## 2024-01-07 NOTE — Telephone Encounter (Signed)
 Pt called back wanting to speak to there nurse requesting the numbers from her lab work. She is wanting to know what her Cholesterol and Diabetes numbers are.  Please advise.

## 2024-01-07 NOTE — Telephone Encounter (Signed)
 Called and spoke to pt and clarified that her ldl is 66 and will mail her results. Pt voiced gratitude and understanding

## 2024-01-09 DIAGNOSIS — M7501 Adhesive capsulitis of right shoulder: Secondary | ICD-10-CM | POA: Diagnosis not present

## 2024-01-14 ENCOUNTER — Ambulatory Visit (INDEPENDENT_AMBULATORY_CARE_PROVIDER_SITE_OTHER)

## 2024-01-14 DIAGNOSIS — I639 Cerebral infarction, unspecified: Secondary | ICD-10-CM | POA: Diagnosis not present

## 2024-01-14 DIAGNOSIS — M7501 Adhesive capsulitis of right shoulder: Secondary | ICD-10-CM | POA: Diagnosis not present

## 2024-01-14 NOTE — Progress Notes (Signed)
 [facility]sx Loop Recorder

## 2024-01-15 ENCOUNTER — Telehealth (HOSPITAL_BASED_OUTPATIENT_CLINIC_OR_DEPARTMENT_OTHER): Payer: Self-pay | Admitting: Family

## 2024-01-15 ENCOUNTER — Encounter: Payer: Self-pay | Admitting: Cardiology

## 2024-01-15 DIAGNOSIS — R7989 Other specified abnormal findings of blood chemistry: Secondary | ICD-10-CM

## 2024-01-15 DIAGNOSIS — E785 Hyperlipidemia, unspecified: Secondary | ICD-10-CM

## 2024-01-15 DIAGNOSIS — E7800 Pure hypercholesterolemia, unspecified: Secondary | ICD-10-CM

## 2024-01-15 LAB — CUP PACEART REMOTE DEVICE CHECK
Date Time Interrogation Session: 20250415115200
Implantable Pulse Generator Implant Date: 20230815
Pulse Gen Serial Number: 183424

## 2024-01-15 NOTE — Telephone Encounter (Signed)
 Appreciate her making us  aware. Low suspicion labs were fasting as collected at 1:24PM which could affect LDL accuracy.  If not fasting at time of labs, recommend return for 'direct LDL' lab to reassess LDL prior to making changes.  If she was fasting, okay to adjust to Pravastatin 40mg  and 20mg  every other day (take 2 tablets on M/W/F and 1 tablet on T/R/Sa/Su). Repeat liver enzymes in 1 month for monitoring and repeat FLP/LFT in 3 months.   Deyja Sochacki S Paola Flynt, NP

## 2024-01-15 NOTE — Telephone Encounter (Signed)
 Patient and her husband stopped by to drop off a note and recent lipid panel lab results.The lab results look like they are in her chart already. Per pt's note, she now has MyChart so I counseled patient that she could use MyChart messaging to communicate with her care team rather than writing notes since patient reports writing is difficult post-stroke. Patient said she would look for a MyChart message from Caitlin as a response to the note and labs. Placed paperwork in Caitlin's box at front desk.

## 2024-01-15 NOTE — Telephone Encounter (Signed)
 FYI papers on your desk

## 2024-01-16 DIAGNOSIS — M7501 Adhesive capsulitis of right shoulder: Secondary | ICD-10-CM | POA: Diagnosis not present

## 2024-01-17 ENCOUNTER — Ambulatory Visit (HOSPITAL_COMMUNITY)
Admission: RE | Admit: 2024-01-17 | Discharge: 2024-01-17 | Disposition: A | Discharge status: Home / Self Care | Source: Ambulatory Visit | Attending: Neurology | Admitting: Neurology

## 2024-01-17 DIAGNOSIS — I639 Cerebral infarction, unspecified: Secondary | ICD-10-CM | POA: Diagnosis not present

## 2024-01-17 DIAGNOSIS — R479 Unspecified speech disturbances: Secondary | ICD-10-CM | POA: Insufficient documentation

## 2024-01-17 DIAGNOSIS — Z8673 Personal history of transient ischemic attack (TIA), and cerebral infarction without residual deficits: Secondary | ICD-10-CM | POA: Diagnosis not present

## 2024-01-17 DIAGNOSIS — G8191 Hemiplegia, unspecified affecting right dominant side: Secondary | ICD-10-CM | POA: Insufficient documentation

## 2024-01-17 DIAGNOSIS — I6622 Occlusion and stenosis of left posterior cerebral artery: Secondary | ICD-10-CM | POA: Diagnosis not present

## 2024-01-17 MED ORDER — GADOBUTROL 1 MMOL/ML IV SOLN
4.0000 mL | Freq: Once | INTRAVENOUS | Status: AC | PRN
  Administered 2024-01-17: 4 mL via INTRAVENOUS

## 2024-01-17 MED ORDER — PRAVASTATIN SODIUM 20 MG PO TABS: ORAL_TABLET | ORAL | 3 refills | Status: DC

## 2024-01-17 NOTE — Addendum Note (Signed)
 Addended by: Guss Legacy on: 01/17/2024 07:21 AM   Modules accepted: Orders

## 2024-01-17 NOTE — Telephone Encounter (Signed)
Rx updated and labs ordered.

## 2024-01-21 DIAGNOSIS — M7501 Adhesive capsulitis of right shoulder: Secondary | ICD-10-CM | POA: Diagnosis not present

## 2024-01-24 ENCOUNTER — Telehealth: Payer: Self-pay | Admitting: Neurology

## 2024-01-24 NOTE — Telephone Encounter (Signed)
 Pt  called to check on MRA results

## 2024-01-24 NOTE — Telephone Encounter (Signed)
 As of this moment the results from the MRA have not yet been resulted. Once the radiologist reviews they will send to provider to review and result and then we will be in touch once we have the results.

## 2024-01-25 DIAGNOSIS — M7501 Adhesive capsulitis of right shoulder: Secondary | ICD-10-CM | POA: Diagnosis not present

## 2024-01-28 DIAGNOSIS — M7501 Adhesive capsulitis of right shoulder: Secondary | ICD-10-CM | POA: Diagnosis not present

## 2024-02-18 ENCOUNTER — Ambulatory Visit (INDEPENDENT_AMBULATORY_CARE_PROVIDER_SITE_OTHER)

## 2024-02-18 DIAGNOSIS — I639 Cerebral infarction, unspecified: Secondary | ICD-10-CM

## 2024-02-18 LAB — CUP PACEART REMOTE DEVICE CHECK
Date Time Interrogation Session: 20250519015700
Implantable Pulse Generator Implant Date: 20230815
Pulse Gen Serial Number: 183424

## 2024-02-19 ENCOUNTER — Ambulatory Visit: Payer: Self-pay | Admitting: Cardiology

## 2024-03-01 ENCOUNTER — Ambulatory Visit: Payer: Self-pay | Admitting: Neurology

## 2024-03-04 ENCOUNTER — Ambulatory Visit (HOSPITAL_BASED_OUTPATIENT_CLINIC_OR_DEPARTMENT_OTHER): Admitting: Family

## 2024-03-04 NOTE — Addendum Note (Signed)
 Addended by: Edra Govern D on: 03/04/2024 02:14 PM   Modules accepted: Orders

## 2024-03-04 NOTE — Progress Notes (Signed)
 Clarity Loop Recorder

## 2024-03-17 ENCOUNTER — Ambulatory Visit (HOSPITAL_BASED_OUTPATIENT_CLINIC_OR_DEPARTMENT_OTHER): Payer: Medicare PPO | Admitting: Cardiovascular Disease

## 2024-03-20 ENCOUNTER — Other Ambulatory Visit (HOSPITAL_COMMUNITY): Payer: Self-pay | Admitting: Family Medicine

## 2024-03-20 DIAGNOSIS — E7800 Pure hypercholesterolemia, unspecified: Secondary | ICD-10-CM | POA: Diagnosis not present

## 2024-03-20 DIAGNOSIS — E785 Hyperlipidemia, unspecified: Secondary | ICD-10-CM | POA: Diagnosis not present

## 2024-03-21 ENCOUNTER — Ambulatory Visit (HOSPITAL_BASED_OUTPATIENT_CLINIC_OR_DEPARTMENT_OTHER): Payer: Self-pay | Admitting: Family

## 2024-03-21 LAB — HEPATIC FUNCTION PANEL
ALT: 31 IU/L (ref 0–32)
AST: 43 IU/L — ABNORMAL HIGH (ref 0–40)
Albumin: 4.6 g/dL (ref 3.8–4.8)
Alkaline Phosphatase: 111 IU/L (ref 44–121)
Bilirubin Total: 0.5 mg/dL (ref 0.0–1.2)
Bilirubin, Direct: 0.21 mg/dL (ref 0.00–0.40)
Total Protein: 6.4 g/dL (ref 6.0–8.5)

## 2024-03-21 LAB — LIPID PANEL
Chol/HDL Ratio: 1.8 ratio (ref 0.0–4.4)
Cholesterol, Total: 154 mg/dL (ref 100–199)
HDL: 88 mg/dL (ref >39)
LDL Chol Calc (NIH): 56 mg/dL (ref 0–99)
Triglycerides: 47 mg/dL (ref 0–149)
VLDL Cholesterol Cal: 10 mg/dL (ref 5–40)

## 2024-03-24 ENCOUNTER — Ambulatory Visit (INDEPENDENT_AMBULATORY_CARE_PROVIDER_SITE_OTHER)

## 2024-03-24 DIAGNOSIS — I639 Cerebral infarction, unspecified: Secondary | ICD-10-CM

## 2024-03-24 LAB — CUP PACEART REMOTE DEVICE CHECK
Date Time Interrogation Session: 20250623030100
Implantable Pulse Generator Implant Date: 20230815
Pulse Gen Serial Number: 183424

## 2024-03-25 ENCOUNTER — Ambulatory Visit: Payer: Self-pay | Admitting: Cardiology

## 2024-03-27 ENCOUNTER — Telehealth: Payer: Self-pay | Admitting: Family

## 2024-03-27 NOTE — Telephone Encounter (Signed)
 Pt would like to go over her MRI results at her appt tomorrow. Please advise

## 2024-03-27 NOTE — Telephone Encounter (Signed)
Appointment notes updated.

## 2024-03-28 ENCOUNTER — Encounter (HOSPITAL_BASED_OUTPATIENT_CLINIC_OR_DEPARTMENT_OTHER): Payer: Self-pay | Admitting: Family

## 2024-03-28 ENCOUNTER — Ambulatory Visit (HOSPITAL_BASED_OUTPATIENT_CLINIC_OR_DEPARTMENT_OTHER): Admitting: Family

## 2024-03-28 VITALS — BP 110/62 | HR 73 | Ht 62.0 in | Wt 100.0 lb

## 2024-03-28 DIAGNOSIS — I10 Essential (primary) hypertension: Secondary | ICD-10-CM

## 2024-03-28 DIAGNOSIS — Z8673 Personal history of transient ischemic attack (TIA), and cerebral infarction without residual deficits: Secondary | ICD-10-CM

## 2024-03-28 DIAGNOSIS — E785 Hyperlipidemia, unspecified: Secondary | ICD-10-CM | POA: Diagnosis not present

## 2024-03-28 MED ORDER — CLOPIDOGREL BISULFATE 75 MG PO TABS: 75.0000 mg | ORAL_TABLET | Freq: Every day | ORAL | 3 refills | Status: DC

## 2024-03-28 MED ORDER — EZETIMIBE 10 MG PO TABS: 10.0000 mg | ORAL_TABLET | Freq: Every day | ORAL | 3 refills | Status: DC

## 2024-03-28 MED ORDER — ISOSORBIDE MONONITRATE ER 30 MG PO TB24: 30.0000 mg | ORAL_TABLET | Freq: Every day | ORAL | 3 refills | Status: DC

## 2024-03-28 MED ORDER — AMLODIPINE BESYLATE 10 MG PO TABS: 10.0000 mg | ORAL_TABLET | Freq: Every day | ORAL | 3 refills | Status: DC

## 2024-03-28 MED ORDER — LISINOPRIL 40 MG PO TABS: 40.0000 mg | ORAL_TABLET | Freq: Every day | ORAL | 3 refills | Status: DC

## 2024-03-28 MED ORDER — PRAVASTATIN SODIUM 20 MG PO TABS: ORAL_TABLET | ORAL | 3 refills | Status: DC

## 2024-03-28 MED ORDER — METOPROLOL TARTRATE 25 MG PO TABS: 25.0000 mg | ORAL_TABLET | Freq: Every day | ORAL | 3 refills | Status: DC

## 2024-03-28 NOTE — Patient Instructions (Addendum)
 Medication Instructions:  Your physician recommends that you continue on your current medications as directed. Please refer to the Current Medication list given to you today.   Follow-Up: Your next appointment:   6 month(s)  Provider:   Annabella Scarce, MD or Reche Finder, NP    Other Instructions  [facility] Stroke Support Group: Contact: Ms. Connell Kiss, DPT [phone] [email or web address] Meeting on the 2nd Thursday of each month 4pm-5pm at [facility] at [facility])   To prevent or reduce lower extremity swelling: Eat a low salt diet. Salt makes the body hold onto extra fluid which causes swelling. Sit with legs elevated. For example, in the recliner or on an ottoman.  Wear knee-high compression stockings during the daytime. Ones labeled 15-20 mmHg provide good compression. Can purchase ones with a zipper if you wish.

## 2024-03-28 NOTE — Progress Notes (Signed)
 Cardiology Office Note   Date:  03/28/2024  ID:  Aleyna, Cueva 1951-04-05, MRN [id number] PCP: Chrystal Lamarr RAMAN, MD  [facility] Providers Cardiologist:  Annabella Scarce, MD     History of Present Illness BETH GOODLIN is a 73 y.o. female  with a hx of celiac, breast cancer s/p lumpectomy and XRT, R subclavian DVT, HTN, CVA, palpitations, PAC/PVC, HLD, CAD.   Prior monitor 2017 for palpitations revealed frequent PVCs. Myoview  2015 with no ischemia. Echo 12/2019 normal LVEF, gr1DD and normal carotid dopplers. Palpitations much improved on Metoprolol . HCTZ transitioned to Amlodipine  due to hyponatremia. Myoview  10/2020 with LVEF 70%, fixed perfusion defect at apex, apical anterior, lateral and inferior walls to felt to be artifact with no ischemia. She had been transitioned from Rosuvastatin  to Pravasatatin due to myalgias. Cardiac CTA11/2022 due to reports of chest pain which showed coronary calcium  score of 14.4 with minimal nonobstructive coronary disease.     Admitted 05/2022 with cryptogenic stroke.  Echo LVEF 60 to 65%, normal diastolic function.  ILR implanted and no arrhythmias have been detected.   She saw pharmacy team 12/07/2022.  LDL not at goal of less than 70.  Pravastatin  continued and Zetia  10 mg daily added.     Seen in clinic 04/04/23 with persistent chest discomfort and dyspnea.  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  There was haziness in the distal LAD poorly-defined unable to rule out significant soft plaque.  Subsequent nuclear stress test 05/04/2023 low risk study.  Last seen by Dr. Scarce 08/27/23 due to fatigue and bradycardia metoprolol  reduced to half tablet daily.  She was recommended to start Lexapro  5 mg daily due to depression.  She had discontinued sertraline due to headaches.  Since that time  pravastatin  increased to 20 mg to 40 mg every other day due to elevated LDL with labs 03/20/2024 LDL 56, AST 43, ALT 31.  Presents today for follow-up with her husband.  Enjoys spending time with her dog.  Notes residual right-sided deficits poststroke and has completed physical therapy.  She is considering starting water aerobics at the [facility]. 01/2024 MRI bilateral carotid artery normal, no vertebral artery stenosis, mild stenosis of left PCA at P1 segment ordered by Dr. Rosemarie of neurology. Reviewed with her ather request, reassurance provided. Reports right frontal headache over last couple weeks. Has not used Tylenol  nor Advil. Discussed she could safely use Tylenol  or Advil once per day for headache. Notes mild R ankle swelling by end of day that is not present in morning, discussed venous insufficiency and leg elevation. She did not start Lexapro  as was worried about adverse effects. We reviewed its efficacy and safety. She feels her mood overall has been better than when last seen.  ROS: Please see the history of present illness.    All other systems reviewed and are negative.   Studies Reviewed      Cardiac Studies & Procedures   ______________________________________________________________________________________________   STRESS TESTS  MYOCARDIAL PERFUSION IMAGING 05/04/2023  Narrative   The study is normal. The study is low risk.   Nonspecific <22mm horizontal ST depression was noted in inferior leads   LV perfusion is normal. There is no evidence of ischemia. There is no evidence of infarction.   Left ventricular function is normal. End diastolic cavity size is normal. End systolic cavity size is normal.  Prior study available for comparison from 10/05/2020.  Fixed perfusion defect at apex with normal wall motion consistent with artifact Low risk study   ECHOCARDIOGRAM  ECHOCARDIOGRAM COMPLETE 05/13/2022  Narrative ECHOCARDIOGRAM REPORT    Patient Name:   RHETT NAJERA Date  of Exam: 05/13/2022 Medical Rec #:  [id number]     Height:       62.0 in Accession #:    [id number]    Weight:       120.8 lb Date of Birth:  Oct 15, 1950     BSA:          1.543 m Patient Age:    70 years      BP:           133/75 mmHg Patient Gender: F             HR:           69 bpm. Exam Location:  Inpatient  Procedure: 2D Echo, Cardiac Doppler and Color Doppler  Indications:    Stroke  History:        Patient has no prior history of Echocardiogram examinations.  Sonographer:    Vernell Hammersmith RDCS Referring Phys: [id number] ERIC LINDZEN  IMPRESSIONS   1. Left ventricular ejection fraction, by estimation, is 60 to 65%. The left ventricle has normal function. The left ventricle has no regional wall motion abnormalities. Left ventricular diastolic parameters were normal. 2. Right ventricular systolic function is normal. The right ventricular size is normal. Tricuspid regurgitation signal is inadequate for assessing PA pressure. 3. The mitral valve is grossly normal. Trivial mitral valve regurgitation. No evidence of mitral stenosis. 4. The aortic valve is tricuspid. Aortic valve regurgitation is not visualized. No aortic stenosis is present. 5. The inferior vena cava is normal in size with greater than 50% respiratory variability, suggesting right atrial pressure of 3 mmHg.  Conclusion(s)/Recommendation(s): No intracardiac source of embolism detected on this transthoracic study. Consider a transesophageal echocardiogram to exclude cardiac source of embolism if clinically indicated.  FINDINGS Left Ventricle: Left ventricular ejection fraction, by estimation, is 60 to 65%. The left ventricle has normal function. The left ventricle has no regional wall motion abnormalities. The left ventricular internal cavity size was normal in size. There is no left ventricular hypertrophy. Left ventricular diastolic parameters were normal.  Right Ventricle: The right ventricular size is normal. No increase in  right ventricular wall thickness. Right ventricular systolic function is normal. Tricuspid regurgitation signal is inadequate for assessing PA pressure.  Left Atrium: Left atrial size was normal in size.  Right Atrium: Right atrial size was normal in size.  Pericardium: There is no evidence of pericardial effusion.  Mitral Valve: The mitral valve is grossly normal. Trivial mitral valve regurgitation. No evidence of mitral valve stenosis.  Tricuspid Valve: The tricuspid valve is grossly normal. Tricuspid valve regurgitation is not demonstrated. No evidence of tricuspid stenosis.  Aortic Valve: The aortic valve is tricuspid. Aortic valve regurgitation is not visualized. No aortic stenosis is present.  Pulmonic Valve: The pulmonic valve was grossly normal. Pulmonic valve regurgitation is not visualized. No evidence of pulmonic stenosis.  Aorta: The aortic root and ascending aorta are structurally normal, with no evidence of dilitation.  Venous: The inferior vena cava is normal in size with greater than 50% respiratory variability, suggesting right atrial pressure of 3 mmHg.  IAS/Shunts: The atrial septum is grossly normal.   LEFT VENTRICLE PLAX 2D LVIDd:         3.60 cm  Diastology LVIDs:         2.60 cm   LV e' medial:    10.30 cm/s LV PW:         0.80 cm   LV E/e' medial:  6.4 LV IVS:        0.60 cm   LV e' lateral:   8.05 cm/s LVOT diam:     1.80 cm   LV E/e' lateral: 8.2 LV SV:         56 LV SV Index:   36 LVOT Area:     2.54 cm   RIGHT VENTRICLE RV Basal diam:  2.60 cm  LEFT ATRIUM             Index        RIGHT ATRIUM           Index LA diam:        2.60 cm 1.69 cm/m   RA Area:     12.60 cm LA Vol (A2C):   50.2 ml 32.53 ml/m  RA Volume:   27.80 ml  18.02 ml/m LA Vol (A4C):   33.5 ml 21.71 ml/m LA Biplane Vol: 40.9 ml 26.51 ml/m AORTIC VALVE LVOT Vmax:   108.00 cm/s LVOT Vmean:  67.200 cm/s LVOT VTI:    0.221 m  AORTA Ao Root diam: 2.90 cm Ao Asc diam:   2.80 cm  MITRAL VALVE MV Area (PHT): 3.91 cm    SHUNTS MV Decel Time: 194 msec    Systemic VTI:  0.22 m MV E velocity: 65.70 cm/s  Systemic Diam: 1.80 cm MV A velocity: 98.50 cm/s MV E/A ratio:  0.67  Darryle Decent MD Electronically signed by Darryle Decent MD Signature Date/Time: 05/13/2022/6:21:45 PM    Final    MONITORS  CARDIAC EVENT MONITOR 01/09/2020  Narrative 30 Day Event Monitor  Quality: Fair.  Baseline artifact. Predominant rhythm: sinus rhythm Average heart rate: 69 bpm Max heart rate: 131 bpm Min heart rate: 47 bpm Pauses >2.5 seconds: none  Patient did submit a symptom diary.  She reported multiple episodes of chest pain and fluttering.  At which time most of the time sinus rhythm with noted.  She did have occasional PACs or PVCs. 4 beats of atrial tachycardia. Occasional PACs and PVCs noted.  Tiffany C. Raford, MD, [facility] 01/11/2020 4:24 PM   CT SCANS  CT CORONARY MORPH W/CTA COR W/SCORE 04/19/2023  Addendum 04/29/2023 12:19 AM ADDENDUM REPORT: 04/29/2023 00:17  EXAM: OVER-READ INTERPRETATION  CT CHEST  The following report is an over-read performed by radiologist Dr. Suzen Dials of [facility] on 04/29/2023. This over-read does not include interpretation of cardiac or coronary anatomy or pathology. The coronary calcium  score/coronary CTA interpretation by the cardiologist is attached.  COMPARISON:  August 02, 2021  FINDINGS: Cardiovascular: There are no significant extracardiac vascular findings.  Mediastinum/Nodes: There are no enlarged lymph nodes within the visualized mediastinum.  Lungs/Pleura: There is no pleural effusion. The visualized lungs appear clear.  Upper abdomen: No significant findings in the visualized upper abdomen.  Musculoskeletal/Chest wall: No chest wall mass or suspicious osseous findings within the visualized chest.  IMPRESSION: No significant extracardiac findings within the visualized  chest.   Electronically Signed By: Suzen Dials M.D. On: 04/29/2023 00:17  Narrative CLINICAL DATA:  Chest pain  EXAM: Cardiac/Coronary CTA  TECHNIQUE: A non-contrast, gated CT scan was obtained with axial slices of 3 mm through the heart for calcium  scoring. Calcium  scoring was performed using the Agatston  method. A 120 kV prospective, gated, contrast cardiac scan was obtained. Gantry rotation speed was 250 msecs and collimation was 0.6 mm. Two sublingual nitroglycerin  tablets (0.8 mg) were given. The 3D data set was reconstructed in 5% intervals of the 35-75% of the R-R cycle. Diastolic phases were analyzed on a dedicated workstation using MPR, MIP, and VRT modes. The patient received 95 cc of contrast.  FINDINGS: Image quality: Excellent.  Noise artifact is: Limited.  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 Right dominance.  Left main: Absent  Left anterior descending artery: The LAD is a large vessel that comes directly off the left coronary cusp. There is mild calcified plaque in the distal LAD with associated stenosis of 25-49% as well as a haziness below the plaque that is poorly defined. Cannot rule out soft plaque in that area. The LAD gives off a large bifurcating patent diagonal #1 branch and patent small D2.  Left circumflex artery: The LCX is non-dominant and arises anomalously from the distal RCA and is patent with no evidence of plaque or stenosis.  Right coronary artery: The RCA is dominant with normal take off from the right coronary cusp. There is mild calcified plaque in the proximal and distal RCA with associated stenosis of 25-49%. The RCA terminates as a PDA and right posterolateral branch. There is mild to moderate calcified plaque in the mid PDA with associated stenosis of 25-49% but could be >50%. The distal RCA gives rise to a small LCx artery that is patent with no  stenosis.  Right Atrium: Right atrial size is within normal limits.  Right Ventricle: The right ventricular cavity is within normal limits.  Left Atrium: Left atrial size is normal in size with no left atrial appendage filling defect.  Left Ventricle: The ventricular cavity size is within normal limits.  Pulmonary arteries: Normal in size.  Pulmonary veins: Normal pulmonary venous drainage.  Pericardium: Normal thickness without significant effusion or calcium  present.  Cardiac valves: The aortic valve is trileaflet without significant calcification. The mitral valve is normal without significant calcification.  Aorta: Normal caliber without significant disease.  Extra-cardiac findings: See attached radiology report for non-cardiac structures.  IMPRESSION: 1. Coronary calcium  score of 56.2. This was 59th percentile for age-, sex, and race-matched controls.  2. 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 Right dominance.  3. Mild atherosclerosis (25-49% proximal and distal RCA/mid RPDA/distal LAD). There is a haziness in the distal LAD that is poorly defined but cannot rule out significant soft plaque. CAD RADS 2.  4. Recommend preventive therapy and risk factor modification.  5. Consider Stress PET CT or Nuclear stress test as distal LAD is not well visualized.  RECOMMENDATIONS: 1. CAD-RADS 0: No evidence of CAD (0%). Consider non-atherosclerotic causes of chest pain.  2. CAD-RADS 1: Minimal non-obstructive CAD (0-24%). Consider non-atherosclerotic causes of chest pain. Consider preventive therapy and risk factor modification.  3. CAD-RADS 2: Mild non-obstructive CAD (25-49%). Consider non-atherosclerotic causes of chest pain. Consider preventive therapy and risk factor modification.  4. CAD-RADS 3: Moderate stenosis. Consider symptom-guided anti-ischemic pharmacotherapy as well as risk factor modification per guideline  directed care. Additional analysis with CT FFR will be submitted.  5. CAD-RADS 4: Severe stenosis. (70-99% or > 50% left main). Cardiac catheterization or CT FFR is recommended. Consider symptom-guided anti-ischemic pharmacotherapy as well as risk factor modification per guideline directed care. Invasive coronary angiography recommended with revascularization per  published guideline statements.  6. CAD-RADS 5: Total coronary occlusion (100%). Consider cardiac catheterization or viability assessment. Consider symptom-guided anti-ischemic pharmacotherapy as well as risk factor modification per guideline directed care.  7. CAD-RADS N: Non-diagnostic study. Obstructive CAD can't be excluded. Alternative evaluation is recommended.  Wilbert Bihari, MD  Electronically Signed: By: Wilbert Bihari M.D. On: 04/19/2023 18:03   CT SCANS  CT CORONARY MORPH W/CTA COR W/SCORE 08/02/2021  Addendum 08/04/2021  8:31 AM ADDENDUM REPORT: 08/04/2021 08:28  EXAM: OVER-READ INTERPRETATION  CT CHEST  The following report is an over-read performed by radiologist Dr. Norleen Kill [facility] on 08/03/2021. This over-read does not include interpretation of cardiac or coronary anatomy or pathology. The coronary calcium  score/coronary CTA interpretation by the cardiologist is attached.  COMPARISON:  None.  FINDINGS: No suspicious nodules, masses, or infiltrates are identified in the visualized portion of the lungs. No pleural fluid seen.  The visualized portions of the mediastinum and chest wall are unremarkable.  IMPRESSION: No significant non-cardiac abnormality in visualized portion of the thorax.   Electronically Signed By: Norleen DELENA Kil M.D. On: 08/04/2021 08:28  Narrative CLINICAL DATA:  73 yo female with chest pain  EXAM: Cardiac/Coronary CTA  TECHNIQUE: A non-contrast, gated CT scan was obtained with axial slices of 3 mm through the heart for calcium  scoring.  Calcium  scoring was performed using the Agatston method. A 120 kV prospective, gated, contrast cardiac scan was obtained. Gantry rotation speed was 250 msecs and collimation was 0.6 mm. Two sublingual nitroglycerin  tablets (0.8 mg) were given. The 3D data set was reconstructed in 5% intervals of the 35-75% of the R-R cycle. Diastolic phases were analyzed on a dedicated workstation using MPR, MIP, and VRT modes. The patient received 95 cc of contrast.  FINDINGS: Image quality: Average.  Noise artifact is: Limited.  Coronary Arteries:  Normal coronary origin.  Right dominance.  Left main: The left main is a large caliber vessel with a normal take off from the left coronary cusp that bifurcates to form a left anterior descending artery. There is no plaque or stenosis.  Left anterior descending artery: The LAD is patent without evidence of plaque or stenosis. The LAD gives off branching D1, smaller D2 and very smalll D3.  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  Right coronary artery: The RCA is dominant with normal take off from the right coronary cusp. There is no evidence of plaque or stenosis. The RCA gives off PDA, 2 posterolateral branches and then terminates in the left circumflex territory. There is minimal (0-24) calcified plaque in the PDA.  Right Atrium: Right atrial size is within normal limits.  Right Ventricle: The right ventricular cavity is within normal limits.  Left Atrium: Left atrial size is normal in size with no left atrial appendage filling defect.  Left Ventricle: The ventricular cavity size is within normal limits. There are no stigmata of prior infarction. There is no abnormal filling defect.  Pulmonary arteries: Normal in size.  Pulmonary veins: Normal pulmonary venous drainage.  Pericardium: Normal thickness with no significant effusion or calcium  present.  Cardiac valves: The aortic valve is trileaflet without  significant calcification. The mitral valve is normal structure without significant calcification.  Aorta: Normal caliber with mild atherosclerosis.  Extra-cardiac findings: See attached radiology report for non-cardiac structures.  IMPRESSION: 1. Coronary calcium  score of 14.4. This was 61 percentile for age-, sex, and race-matched controls.  2. Normal coronary origin with right dominance; congenitally absent  left circumflex; RCA gives off PDA and 2 posterolateral branches and terminates in the left circumflex distribution.  3. Minimal plaque in the PDA.  4. Mild aortic atherosclerosis.  RECOMMENDATIONS: CAD-RADS 1: Minimal non-obstructive CAD (0-24%). Consider non-atherosclerotic causes of chest pain. Consider preventive therapy and risk factor modification.  Redell Shallow, MD  interpretation by the cardiologist is attached.  Electronically Signed: By: Redell Shallow M.D. On: 08/02/2021 15:54     ______________________________________________________________________________________________      Risk Assessment/Calculations           Physical Exam VS:  BP 110/62 (BP Location: Left Arm, Patient Position: Sitting, Cuff Size: Normal)   Pulse (!) 47   Ht 5' 2 (1.575 m)   Wt 100 lb (45.4 kg)   LMP  (LMP Unknown)   SpO2 98%   BMI 18.29 kg/m        Wt Readings from Last 3 Encounters:  03/28/24 100 lb (45.4 kg)  12/31/23 101 lb 12.8 oz (46.2 kg)  08/27/23 104 lb 4.8 oz (47.3 kg)    GEN: Well nourished, well developed in no acute distress NECK: No JVD; No carotid bruits CARDIAC: RRR, no murmurs, rubs, gallops RESPIRATORY:  Clear to auscultation without rales, wheezing or rhonchi  ABDOMEN: Soft, non-tender, non-distended EXTREMITIES:  No edema; No deformity   ASSESSMENT AND PLAN HLD, LDL goal <70 - 03/07/23 total cholesterol 166, triglycerides 103, HDL 84, LDL 64. Continue Pravastatin , Zetia . ALT 43 which is minimally elevated: does not drink etoh, limits  fried foods, takes tylenol  sparingly. Consider LFT at 6 mos f/u for monitoring. LFT overall improved from previous.   HTN - BP well controlled. Continue current antihypertensive regimen. Refills provided.  Heart healthy diet and regular cardiovascular exercise encouraged.     History of CVA- ILR with no arrhythmias. Continue lipid control with Pravastatin , Zetia  as above.Continue optimal BP control with goal <130/80. Given info for stroke support group of [facility].   PAC - Well controlled on Metoprolol  25mg  daily.    Hypokalemia - Maintained on potassium supplement.    Nonobstructive CAD- cardiac CTA 08/2021 with coronary calcium  score 14.4 which was 47th percentile with minimal calcified plaque in the PDA. 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 Repeat CTA 04/2019 for coronary calcium  score 56.2 placing her in the 59th percentile for age/sex/race matched control, normal coronary origin with left circumflex arising from distal RCA.  Overall mild atherosclerosis 25 to 29% in proximal and distal RCA/mid RPDA/distal LAD.  Haziness distal LAD poorly defined with subsequent Myoview  05/04/2023 with no ischemia.  Stable with no anginal symptoms. No indication for ischemic evaluation.    GDMT includes Imdur , Aspirin , Metoprolol , Pravastatin , Zetia , Imdur .   Depression - did not start Lexapro  as prescribed. Feels her mood is better. Reviewed safety and efficacy of Lexapro  if she elects to start in the future.           Dispo: follow up in 6 months with Dr. Raford  Signed, Reche GORMAN Finder, NP

## 2024-04-08 NOTE — Progress Notes (Signed)
 Clarity loop Recorder

## 2024-04-11 ENCOUNTER — Telehealth: Payer: Self-pay | Admitting: Cardiovascular Disease

## 2024-04-11 MED ORDER — POTASSIUM CHLORIDE CRYS ER 20 MEQ PO TBCR: 20.0000 meq | EXTENDED_RELEASE_TABLET | Freq: Every day | ORAL | 3 refills | Status: DC

## 2024-04-11 NOTE — Telephone Encounter (Signed)
*  STAT* If patient is at the pharmacy, call can be transferred to refill team.   1. Which medications need to be refilled? (please list name of each medication and dose if known)   potassium chloride  SA (KLOR-CON  M20) 20 MEQ tablet    2. Which pharmacy/location (including street and city if local pharmacy) is medication to be sent to? [facility] [address] [place], [place] - [address]    3. Do they need a 30 day or 90 day supply?  90 day supply

## 2024-04-11 NOTE — Telephone Encounter (Signed)
 Rx sent to requested Pharmacy.

## 2024-04-15 DIAGNOSIS — R519 Headache, unspecified: Secondary | ICD-10-CM | POA: Diagnosis not present

## 2024-04-15 DIAGNOSIS — R42 Dizziness and giddiness: Secondary | ICD-10-CM | POA: Diagnosis not present

## 2024-04-15 DIAGNOSIS — R051 Acute cough: Secondary | ICD-10-CM | POA: Diagnosis not present

## 2024-04-15 DIAGNOSIS — I700 Atherosclerosis of aorta: Secondary | ICD-10-CM | POA: Diagnosis not present

## 2024-04-24 ENCOUNTER — Ambulatory Visit

## 2024-04-24 ENCOUNTER — Ambulatory Visit: Payer: Self-pay | Admitting: Cardiology

## 2024-04-24 DIAGNOSIS — I639 Cerebral infarction, unspecified: Secondary | ICD-10-CM | POA: Diagnosis not present

## 2024-04-24 LAB — CUP PACEART REMOTE DEVICE CHECK
Date Time Interrogation Session: 20250724020400
Implantable Pulse Generator Implant Date: 20230815
Pulse Gen Serial Number: 183424

## 2024-04-28 ENCOUNTER — Encounter

## 2024-04-28 NOTE — Progress Notes (Signed)
 [facility]sx Loop Recorder

## 2024-05-06 DIAGNOSIS — F321 Major depressive disorder, single episode, moderate: Secondary | ICD-10-CM | POA: Diagnosis not present

## 2024-05-06 DIAGNOSIS — E785 Hyperlipidemia, unspecified: Secondary | ICD-10-CM | POA: Diagnosis not present

## 2024-05-06 DIAGNOSIS — Z0000 Encounter for general adult medical examination without abnormal findings: Secondary | ICD-10-CM | POA: Diagnosis not present

## 2024-05-06 DIAGNOSIS — E039 Hypothyroidism, unspecified: Secondary | ICD-10-CM | POA: Diagnosis not present

## 2024-05-06 DIAGNOSIS — I25118 Atherosclerotic heart disease of native coronary artery with other forms of angina pectoris: Secondary | ICD-10-CM | POA: Diagnosis not present

## 2024-05-06 DIAGNOSIS — E559 Vitamin D deficiency, unspecified: Secondary | ICD-10-CM | POA: Diagnosis not present

## 2024-05-06 DIAGNOSIS — E440 Moderate protein-calorie malnutrition: Secondary | ICD-10-CM | POA: Diagnosis not present

## 2024-05-06 DIAGNOSIS — Z1211 Encounter for screening for malignant neoplasm of colon: Secondary | ICD-10-CM | POA: Diagnosis not present

## 2024-05-06 DIAGNOSIS — R7303 Prediabetes: Secondary | ICD-10-CM | POA: Diagnosis not present

## 2024-05-06 DIAGNOSIS — E538 Deficiency of other specified B group vitamins: Secondary | ICD-10-CM | POA: Diagnosis not present

## 2024-05-26 ENCOUNTER — Ambulatory Visit

## 2024-05-26 DIAGNOSIS — I639 Cerebral infarction, unspecified: Secondary | ICD-10-CM

## 2024-05-27 LAB — CUP PACEART REMOTE DEVICE CHECK
Date Time Interrogation Session: 20250825030900
Implantable Pulse Generator Implant Date: 20230815
Pulse Gen Serial Number: 183424

## 2024-05-28 ENCOUNTER — Ambulatory Visit: Payer: Self-pay | Admitting: Cardiology

## 2024-05-29 ENCOUNTER — Encounter

## 2024-06-05 ENCOUNTER — Emergency Department (HOSPITAL_BASED_OUTPATIENT_CLINIC_OR_DEPARTMENT_OTHER)
Admission: EM | Admit: 2024-06-05 | Discharge: 2024-06-05 | Disposition: A | Discharge status: Home / Self Care | Source: Ambulatory Visit | Attending: Emergency Medicine | Admitting: Emergency Medicine

## 2024-06-05 ENCOUNTER — Other Ambulatory Visit: Payer: Self-pay

## 2024-06-05 ENCOUNTER — Encounter (HOSPITAL_BASED_OUTPATIENT_CLINIC_OR_DEPARTMENT_OTHER): Payer: Self-pay

## 2024-06-05 DIAGNOSIS — L03115 Cellulitis of right lower limb: Secondary | ICD-10-CM | POA: Diagnosis not present

## 2024-06-05 DIAGNOSIS — M25571 Pain in right ankle and joints of right foot: Secondary | ICD-10-CM | POA: Diagnosis not present

## 2024-06-05 DIAGNOSIS — M25471 Effusion, right ankle: Secondary | ICD-10-CM | POA: Insufficient documentation

## 2024-06-05 DIAGNOSIS — H109 Unspecified conjunctivitis: Secondary | ICD-10-CM | POA: Diagnosis not present

## 2024-06-05 DIAGNOSIS — Z91040 Latex allergy status: Secondary | ICD-10-CM | POA: Insufficient documentation

## 2024-06-05 DIAGNOSIS — Z7902 Long term (current) use of antithrombotics/antiplatelets: Secondary | ICD-10-CM | POA: Diagnosis not present

## 2024-06-05 DIAGNOSIS — M7989 Other specified soft tissue disorders: Secondary | ICD-10-CM | POA: Diagnosis not present

## 2024-06-05 DIAGNOSIS — R2241 Localized swelling, mass and lump, right lower limb: Secondary | ICD-10-CM | POA: Diagnosis not present

## 2024-06-05 MED ORDER — ENOXAPARIN SODIUM 40 MG/0.4ML IJ SOSY
40.0000 mg | PREFILLED_SYRINGE | Freq: Once | INTRAMUSCULAR | Status: AC
  Administered 2024-06-05: 40 mg via SUBCUTANEOUS
  Filled 2024-06-05: qty 0.4

## 2024-06-05 NOTE — ED Provider Notes (Signed)
 [name] Provider Note   CSN: [id number] Arrival date & time: 06/05/24  1945     Patient presents with: Leg Swelling   Angela Buchanan is a 73 y.o. female.   Patient presents to the emergency department today for evaluation of right ankle swelling.  Patient was seen at [facility] walk-in clinic prior to arrival and was sent for DVT rule out.  Patient has a history of stroke and is on Plavix  daily.  She denies any active bleeding symptoms.  She reports swelling to the right ankle with associated pain.  Pain does radiate up to the calf bit, but is mostly in the ankle.  Pain is worse with movement and bearing weight.  At urgent care they were concerned about cellulitis, potential inflammatory arthritis, or DVT.  She was sent for rule out.  She was prescribed doxycycline trial.  No fevers.  Symptoms started about 2 days ago.  No injuries reported.  No chest pain or shortness of breath.       Prior to Admission medications   Medication Sig Start Date End Date Taking? Authorizing Provider  amLODipine  (NORVASC ) 10 MG tablet Take 1 tablet (10 mg total) by mouth daily. 03/28/24   Vannie Reche RAMAN, NP  calcium -vitamin D  (OSCAL WITH D) 500-5 MG-MCG tablet Take 1 tablet by mouth daily with breakfast. 05/18/22   Waddell Karna LABOR, NP  cetirizine (ZYRTEC) 10 MG tablet Take 10 mg by mouth daily.    [provider]  Cholecalciferol  (VITAMIN D ) 50 MCG (2000 UT) tablet Take 2,000 Units by mouth daily.    [provider]  clobetasol  ointment (TEMOVATE ) 0.05 % Apply topically 2 (two) times daily as needed (itching). 05/17/22   Waddell Karna LABOR, NP  clopidogrel  (PLAVIX ) 75 MG tablet Take 1 tablet (75 mg total) by mouth daily. 03/28/24   Vannie Reche RAMAN, NP  docusate sodium  (COLACE) 100 MG capsule Take 1 capsule (100 mg total) by mouth 2 (two) times daily. 06/26/22   Akula, Vijaya, MD  escitalopram  (LEXAPRO ) 5 MG tablet Take 1 tablet (5 mg total) by mouth  daily. Patient not taking: Reported on 03/28/2024 08/27/23   Raford Riggs, MD  ezetimibe  (ZETIA ) 10 MG tablet Take 1 tablet (10 mg total) by mouth daily. 03/28/24   Walker, Caitlin S, NP  feeding supplement (ENSURE ENLIVE / ENSURE PLUS) LIQD Take 237 mLs by mouth 2 (two) times daily between meals. 06/26/22   Akula, Vijaya, MD  fluticasone (FLONASE) 50 MCG/ACT nasal spray Place 1 spray into both nostrils daily as needed for allergies or rhinitis.    [provider]  HYDROcodone -acetaminophen  (NORCO) 5-325 MG tablet Take 1 tablet by mouth every 6 (six) hours as needed for severe pain. 06/23/22   McBane, Caroline N, PA-C  hydrOXYzine  (ATARAX ) 25 MG tablet Take 25 mg by mouth 3 (three) times daily as needed for nausea or vomiting.    [provider]  isosorbide  mononitrate (IMDUR ) 30 MG 24 hr tablet Take 1 tablet (30 mg total) by mouth daily. 03/28/24   Walker, Caitlin S, NP  levothyroxine  (SYNTHROID ) 25 MCG tablet Take 1 tablet (25 mcg total) by mouth daily at 6 (six) AM. 05/18/22   Waddell Karna LABOR, NP  lisinopril  (ZESTRIL ) 40 MG tablet Take 1 tablet (40 mg total) by mouth daily. 03/28/24   Walker, Caitlin S, NP  meclizine (ANTIVERT) 25 MG tablet Take 25 mg by mouth daily as needed for dizziness.    [provider]  methocarbamol  (ROBAXIN ) 500 MG tablet Take 1 tablet (500 mg total) by mouth every 6 (six) hours as needed for muscle spasms. 06/26/22   Akula, Vijaya, MD  metoprolol  tartrate (LOPRESSOR ) 25 MG tablet Take 1 tablet (25 mg total) by mouth daily. 03/28/24   Vannie Reche RAMAN, NP  Multiple Vitamins-Minerals (CENTRUM SILVER 50+WOMEN PO) Take 1 tablet by mouth daily.    [provider]  nystatin  (MYCOSTATIN ) 100000 UNIT/ML suspension Take 5 mLs (500,000 Units total) by mouth 4 (four) times daily. 07/31/22   Geroldine Berg, MD  oxyCODONE -acetaminophen  (PERCOCET) 5-325 MG tablet Take 1-2 tablets by mouth every 6 (six) hours as needed. 07/31/22   Geroldine Berg, MD   pantoprazole  (PROTONIX ) 40 MG tablet Take 1 tablet (40 mg total) by mouth daily at 12 noon. 05/17/22   Waddell Karna LABOR, NP  polyethylene glycol (MIRALAX  / GLYCOLAX ) 17 g packet Take 17 g by mouth daily as needed for mild constipation. 06/26/22   Akula, Vijaya, MD  potassium chloride  SA (KLOR-CON  M20) 20 MEQ tablet Take 1 tablet (20 mEq total) by mouth daily. Alternate 20 and 40meq tablets every other day 04/11/24   Raford Riggs, MD  pravastatin  (PRAVACHOL ) 20 MG tablet Pravastatin  40mg  and 20mg  every other day (take 2 tablets on M/W/F and 1 tablet on T/R/Sa/Su) 03/28/24   Walker, Caitlin S, NP  senna (SENOKOT) 8.6 MG TABS tablet Take 2 tablets (17.2 mg total) by mouth daily as needed for mild constipation. 06/26/22   Akula, Vijaya, MD    Allergies: Atenolol, Bisoprolol, Prednisone, Amoxicillin, Amoxicillin-pot clavulanate, Hctz [hydrochlorothiazide ], Rosuvastatin , and Latex    Review of Systems  Updated Vital Signs BP 110/65   Pulse 65   Temp 98.2 F (36.8 C) (Oral)   Resp 18   Ht 5' 2 (1.575 m)   Wt 45.4 kg   LMP  (LMP Unknown)   SpO2 100%   BMI 18.29 kg/m   Physical Exam Vitals and nursing note reviewed.  Constitutional:      Appearance: She is well-developed.  HENT:     Head: Normocephalic and atraumatic.  Eyes:     Conjunctiva/sclera: Conjunctivae normal.  Pulmonary:     Effort: No respiratory distress.  Musculoskeletal:     Cervical back: Normal range of motion and neck supple.     Right knee: Normal range of motion. No tenderness.     Right lower leg: Swelling (Minimal) and tenderness (Minimal) present.     Right ankle: Swelling present. Tenderness present. Normal range of motion.     Comments: Patient with some soft tissue swelling about the right ankle, fairly localized, mild swelling to the distal calf and mild calf tenderness to palpation.  No overlying erythema or redness.  I am able to passively range the ankle with some discomfort, but patient is able to do so  and exam is not consistent with septic arthritis.  Skin:    General: Skin is warm and dry.  Neurological:     Mental Status: She is alert.     (all labs ordered are listed, but only abnormal results are displayed) Labs Reviewed - No data to display  EKG: None  Radiology: No results found.   Procedures   Medications Ordered in the ED  enoxaparin  (LOVENOX ) injection 40 mg (40 mg Subcutaneous Given 06/05/24 2225)   ED Course  Patient seen and examined. History obtained directly from patient.  She has paperwork from [facility] walk-in clinic with her that I have reviewed.  Labs/EKG:  None ordered  Imaging: None ordered  Medications/Fluids: Ordered: Lovenox  40 mg x 1.  Patient is on Plavix  but does not report any current bleeding symptoms.  Most recent vital signs reviewed and are as follows: BP 110/65   Pulse 65   Temp 98.2 F (36.8 C) (Oral)   Resp 18   Ht 5' 2 (1.575 m)   Wt 45.4 kg   LMP  (LMP Unknown)   SpO2 100%   BMI 18.29 kg/m   Initial impression: Right ankle swelling, rule out DVT.  We do not have ultrasound here tonight unfortunately.  We have made arrangements for patient to be called tomorrow around noon with a time to come back for DVT study.  Patient requesting late morning, early afternoon appointment.  Home treatment plan: Patient was prescribed and will be taking doxycycline prescribed by walk-in clinic.  Return instructions discussed with patient: New or worsening symptoms, chest pain or shortness of breath  Follow-up instructions discussed with patient: PCP follow-up to ensure improvement with prescribed treatment.                                   Medical Decision Making Risk Prescription drug management.   Patient with right lower extremity swelling, more focal to the right ankle.  No obvious cellulitis.  No history of gout or inflammatory arthritis.  Sent for DVT rule out today.  Arrangements made for her to return tomorrow.  She is covered  with a dose of Lovenox  overnight.  No signs or symptoms of PE at this time.     Final diagnoses:  Right ankle swelling    ED Discharge Orders          Ordered    Lower Ext Right Venous US        Comments: IMPORTANT PATIENT INSTRUCTIONS:  You have been scheduled for an Outpatient Ultrasound.    Your appointment has been scheduled for:  _______ am/pm on _______________ (date).  If your appointment is scheduled for a Saturday, Sunday or holiday, please go to the [employer] at [facility] Emergency Department Registration Desk at least 15 minutes prior to your appointment time and tell them you are there for an ultrasound.    If your appointment is scheduled for a weekday (Monday - Friday), please go directly to the [facility] at [facility] Radiology Department reception area at least 15 minutes prior to your appointment time and tell them you are there for an ultrasound.  Please call [phone] with questions.   06/05/24 2208               Desiderio Chew, PA-C 06/05/24 2239    Geraldene Hamilton, MD 06/06/24 [phone]

## 2024-06-05 NOTE — Discharge Instructions (Addendum)
 Please read and follow all provided instructions.  Your diagnoses today include:  1. Right ankle swelling    Tests performed today include: Vital signs. See below for your results today.   Medications prescribed:  None  Take any prescribed medications only as directed.  Additional Information:  Follow any educational materials contained in this packet.  Although you appear stable for discharge, you may still have a blood clot in your leg(s) called a DVT (deep venous thrombosis). You may have received initial treatment with a blood thinner to treat DVTs, but low risk patients do not always get treated before an ultrasound is performed to diagnose or rule out a DVT, due to the risks of bleeding from the medication used. It is important you follow up for an ultrasound within one day as directed.  Follow-up instructions: The current plan is for our team to call you tomorrow to schedule an appointment after lunch.    Please follow-up with your primary care provider in the next 1-2 days for further evaluation of your symptoms.   Return instructions:  Please return to the Emergency Department if you experience worsening symptoms. Return immediately if you develop chest pain, shortness of breath, dizziness or fainting. Return with new color change or weakness/numbness to your affected leg(s). Return with bleeding, severe headache, confusion or altered mental status. Please return if you have any other emergent concerns.  Additional Information:  Your vital signs today were: BP 110/65   Pulse 65   Temp 98.2 F (36.8 C) (Oral)   Resp 18   Ht 5' 2 (1.575 m)   Wt 45.4 kg   LMP  (LMP Unknown)   SpO2 100%   BMI 18.29 kg/m  If your blood pressure (BP) was elevated above 135/85 this visit, please have this repeated by your doctor within one month. --------------

## 2024-06-05 NOTE — ED Triage Notes (Signed)
 Pt c/o R lower leg, ankle, and foot swelling. Ongoing x2-3 days. Denies any known injury or trauma. Seen at [facility] today and recommended to come here for DVT rule out. Pt denies CP/SHOB. Swelling noted to RLE. Painful to palpation and with ambulation. Prior hx clots, stroke 2023.

## 2024-06-05 NOTE — ED Notes (Signed)
 Pt d/c instructions, medications, and follow-up care reviewed with pt. Pt verbalized understanding and had no further questions at time of d/c. Pt CA&Ox4, ambulatory, and in NAD at time of d/c

## 2024-06-06 DIAGNOSIS — I83891 Varicose veins of right lower extremities with other complications: Secondary | ICD-10-CM | POA: Diagnosis not present

## 2024-06-06 DIAGNOSIS — M79661 Pain in right lower leg: Secondary | ICD-10-CM | POA: Diagnosis not present

## 2024-06-08 DIAGNOSIS — L03115 Cellulitis of right lower limb: Secondary | ICD-10-CM | POA: Diagnosis not present

## 2024-06-09 ENCOUNTER — Ambulatory Visit: Admitting: Occupational Therapy

## 2024-06-18 NOTE — Progress Notes (Signed)
 Remote Loop Recorder Transmission

## 2024-06-25 DIAGNOSIS — W19XXXA Unspecified fall, initial encounter: Secondary | ICD-10-CM | POA: Diagnosis not present

## 2024-06-25 DIAGNOSIS — R0781 Pleurodynia: Secondary | ICD-10-CM | POA: Diagnosis not present

## 2024-06-26 ENCOUNTER — Ambulatory Visit (INDEPENDENT_AMBULATORY_CARE_PROVIDER_SITE_OTHER)

## 2024-06-26 DIAGNOSIS — I639 Cerebral infarction, unspecified: Secondary | ICD-10-CM | POA: Diagnosis not present

## 2024-06-26 LAB — CUP PACEART REMOTE DEVICE CHECK
Date Time Interrogation Session: 20250925011300
Implantable Pulse Generator Implant Date: 20230815
Pulse Gen Serial Number: 183424

## 2024-06-29 ENCOUNTER — Encounter

## 2024-07-02 NOTE — Progress Notes (Signed)
 Remote Loop Recorder Transmission

## 2024-07-03 NOTE — Progress Notes (Signed)
 Remote Loop Recorder Transmission

## 2024-07-04 ENCOUNTER — Ambulatory Visit: Payer: Self-pay | Admitting: Cardiology

## 2024-07-10 DIAGNOSIS — Z23 Encounter for immunization: Secondary | ICD-10-CM | POA: Diagnosis not present

## 2024-07-16 DIAGNOSIS — Z1211 Encounter for screening for malignant neoplasm of colon: Secondary | ICD-10-CM | POA: Diagnosis not present

## 2024-07-24 ENCOUNTER — Other Ambulatory Visit: Payer: Self-pay | Admitting: Family Medicine

## 2024-07-24 DIAGNOSIS — Z1231 Encounter for screening mammogram for malignant neoplasm of breast: Secondary | ICD-10-CM

## 2024-07-28 ENCOUNTER — Ambulatory Visit (INDEPENDENT_AMBULATORY_CARE_PROVIDER_SITE_OTHER)

## 2024-07-28 DIAGNOSIS — I639 Cerebral infarction, unspecified: Secondary | ICD-10-CM

## 2024-07-28 LAB — CUP PACEART REMOTE DEVICE CHECK
Date Time Interrogation Session: 20251027011700
Implantable Pulse Generator Implant Date: 20230815
Pulse Gen Serial Number: 183424

## 2024-07-30 ENCOUNTER — Encounter

## 2024-07-30 ENCOUNTER — Ambulatory Visit: Payer: Self-pay | Admitting: Cardiology

## 2024-08-05 NOTE — Progress Notes (Signed)
 Remote Loop Recorder Transmission

## 2024-08-14 ENCOUNTER — Ambulatory Visit
Admission: RE | Admit: 2024-08-14 | Discharge: 2024-08-14 | Disposition: A | Discharge status: Home / Self Care | Source: Ambulatory Visit | Attending: Family Medicine | Admitting: Family Medicine

## 2024-08-14 DIAGNOSIS — Z1231 Encounter for screening mammogram for malignant neoplasm of breast: Secondary | ICD-10-CM

## 2024-08-18 ENCOUNTER — Encounter (HOSPITAL_BASED_OUTPATIENT_CLINIC_OR_DEPARTMENT_OTHER): Payer: Self-pay | Admitting: Family

## 2024-08-28 ENCOUNTER — Ambulatory Visit

## 2024-08-28 DIAGNOSIS — I639 Cerebral infarction, unspecified: Secondary | ICD-10-CM | POA: Diagnosis not present

## 2024-08-29 LAB — CUP PACEART REMOTE DEVICE CHECK
Date Time Interrogation Session: 20251127002100
Implantable Pulse Generator Implant Date: 20230815
Pulse Gen Serial Number: 183424

## 2024-08-30 ENCOUNTER — Encounter

## 2024-09-01 ENCOUNTER — Ambulatory Visit: Payer: Self-pay | Admitting: Cardiology

## 2024-09-02 NOTE — Progress Notes (Signed)
 Remote Loop Recorder Transmission

## 2024-09-08 ENCOUNTER — Ambulatory Visit (HOSPITAL_BASED_OUTPATIENT_CLINIC_OR_DEPARTMENT_OTHER): Admitting: Family

## 2024-09-12 ENCOUNTER — Ambulatory Visit (HOSPITAL_BASED_OUTPATIENT_CLINIC_OR_DEPARTMENT_OTHER): Admitting: Family

## 2024-09-12 ENCOUNTER — Encounter (HOSPITAL_BASED_OUTPATIENT_CLINIC_OR_DEPARTMENT_OTHER): Payer: Self-pay | Admitting: Family

## 2024-09-12 VITALS — BP 122/60 | HR 50 | Ht 62.0 in | Wt 102.1 lb

## 2024-09-12 DIAGNOSIS — E876 Hypokalemia: Secondary | ICD-10-CM

## 2024-09-12 DIAGNOSIS — I25118 Atherosclerotic heart disease of native coronary artery with other forms of angina pectoris: Secondary | ICD-10-CM

## 2024-09-12 DIAGNOSIS — I10 Essential (primary) hypertension: Secondary | ICD-10-CM

## 2024-09-12 DIAGNOSIS — Z8673 Personal history of transient ischemic attack (TIA), and cerebral infarction without residual deficits: Secondary | ICD-10-CM

## 2024-09-12 DIAGNOSIS — E785 Hyperlipidemia, unspecified: Secondary | ICD-10-CM

## 2024-09-12 MED ORDER — EZETIMIBE 10 MG PO TABS: 10.0000 mg | ORAL_TABLET | Freq: Every day | ORAL | 3 refills | Status: AC

## 2024-09-12 MED ORDER — METOPROLOL TARTRATE 25 MG PO TABS: 25.0000 mg | ORAL_TABLET | Freq: Every day | ORAL | 3 refills | Status: AC

## 2024-09-12 MED ORDER — CLOPIDOGREL BISULFATE 75 MG PO TABS: 75.0000 mg | ORAL_TABLET | Freq: Every day | ORAL | 3 refills | Status: AC

## 2024-09-12 MED ORDER — POTASSIUM CHLORIDE CRYS ER 20 MEQ PO TBCR: 20.0000 meq | EXTENDED_RELEASE_TABLET | Freq: Every day | ORAL | 3 refills | Status: AC

## 2024-09-12 MED ORDER — AMLODIPINE BESYLATE 10 MG PO TABS: 10.0000 mg | ORAL_TABLET | Freq: Every day | ORAL | 3 refills | Status: AC

## 2024-09-12 MED ORDER — ISOSORBIDE MONONITRATE ER 30 MG PO TB24: 30.0000 mg | ORAL_TABLET | Freq: Every day | ORAL | 3 refills | Status: AC

## 2024-09-12 MED ORDER — PRAVASTATIN SODIUM 20 MG PO TABS: ORAL_TABLET | ORAL | 3 refills | Status: AC

## 2024-09-12 MED ORDER — ESCITALOPRAM OXALATE 5 MG PO TABS: 5.0000 mg | ORAL_TABLET | Freq: Every day | ORAL | 1 refills | Status: AC

## 2024-09-12 MED ORDER — LISINOPRIL 40 MG PO TABS: 40.0000 mg | ORAL_TABLET | Freq: Every day | ORAL | 3 refills | Status: AC

## 2024-09-12 NOTE — Patient Instructions (Addendum)
 Medication Instructions:   START Lexapro  5mg  daily to help with mood  *If you need a refill on your cardiac medications before your next appointment, please call your pharmacy*  Lab Work: Your blood work in August showed your liver and cholesterol looked great!  Follow-Up: At [facility], you and your health needs are our priority.  As part of our continuing mission to provide you with exceptional heart care, our providers are all part of one team.  This team includes your primary Cardiologist (physician) and Advanced Practice Providers or APPs (Physician Assistants and Nurse Practitioners) who all work together to provide you with the care you need, when you need it.  Your next appointment:   6 months with Annabella Scarce, MD or Reche Finder, NP    We recommend signing up for the patient portal called MyChart.  Sign up information is provided on this After Visit Summary.  MyChart is used to connect with patients for Virtual Visits (Telemedicine).  Patients are able to view lab/test results, encounter notes, upcoming appointments, etc.  Non-urgent messages can be sent to your provider as well.   To learn more about what you can do with MyChart, go to [email or web address].   Other Instructions If you decide you want to do physical therapy, simply call and let us  know and we will place a referral.   [facility] Stroke Support Group: Contact: Ms. Connell Kiss, DPT [phone] [email or web address] Meeting on the 2nd Thursday of each month 4pm-5pm at [facility] at [place] [phone])

## 2024-09-12 NOTE — Progress Notes (Signed)
 Cardiology Office Note   Date:  09/12/2024  ID:  Angela Buchanan, Angela Buchanan [birth year], MRN [id number] PCP: Chrystal Lamarr RAMAN, MD  [facility] Providers Cardiologist:  Annabella Scarce, MD     History of Present Illness Angela Buchanan is a 73 y.o. female  with a hx of celiac, breast cancer s/p lumpectomy and XRT, R subclavian DVT, HTN, CVA, palpitations, PAC/PVC, HLD, CAD.   Prior monitor 2017 for palpitations revealed frequent PVCs. Myoview  2015 with no ischemia. Echo 12/2019 normal LVEF, gr1DD and normal carotid dopplers. Palpitations much improved on Metoprolol . HCTZ transitioned to Amlodipine  due to hyponatremia. Myoview  10/2020 with LVEF 70%, fixed perfusion defect at apex, apical anterior, lateral and inferior walls to felt to be artifact with no ischemia. She had been transitioned from Rosuvastatin  to Pravasatatin due to myalgias. Cardiac CTA11/2022 due to reports of chest pain which showed coronary calcium  score of 14.4 with minimal nonobstructive coronary disease.     Admitted 05/2022 with cryptogenic stroke.  Echo LVEF 60 to 65%, normal diastolic function.  ILR implanted and no arrhythmias have been detected.   She saw pharmacy team 12/07/2022.  LDL not at goal less than 70.  Pravastatin  continued and Zetia  10 mg daily added.     Seen in clinic 04/04/23 with persistent chest discomfort and dyspnea.  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  There was haziness in the distal LAD poorly-defined unable to rule out significant soft plaque.  Subsequent nuclear stress test 05/04/2023 low risk study.  At visit 08/27/23 due to fatigue and bradycardia metoprolol  reduced to half tablet daily.  She was recommended to start Lexapro  5 mg daily due to depression but never started.  She had discontinued sertraline due to headaches.  Pravastatin  later  increased to 20 mg to 40 mg every other day due to elevated LDL with labs 03/20/2024 LDL 56, AST 43, ALT 31.  Presents today for follow-up with her husband.  Enjoys spending time with her dog.  Most recently reports some falls where she will suddenly lose her balance. No lightheadedness, dizziness, syncope. Completed PT >6 months ago. Discussed if she feels necessary again, we are happy to refer but she prefers to think about it. Reports no shortness of breath nor dyspnea on exertion. Reports no chest pain, pressure, or tightness. No edema, orthopnea, PND. Reports no palpitations.  Reviewed how her medications help prevent recurrent stroke which she is reassured by. She inquires about liver function on Pravastatin , reviewed normal liver function on labs 05/2024.   ROS: Please see the history of present illness.    All other systems reviewed and are negative.   Studies Reviewed      Cardiac Studies & Procedures   ______________________________________________________________________________________________   STRESS TESTS  MYOCARDIAL PERFUSION IMAGING 05/04/2023  Interpretation Summary   The study is normal. The study is low risk.   Nonspecific <68mm horizontal ST depression was noted in inferior leads   LV perfusion is normal. There is no evidence of ischemia. There is no evidence of infarction.   Left ventricular function is normal. End diastolic cavity size is normal. End systolic cavity size is normal.   Prior study available for comparison from 10/05/2020.  Fixed perfusion defect at apex with normal wall motion consistent with artifact Low risk study   ECHOCARDIOGRAM  ECHOCARDIOGRAM COMPLETE 05/13/2022  Narrative ECHOCARDIOGRAM REPORT  Patient Name:   Angela Buchanan Date of Exam: 05/13/2022 Medical Rec #:  [id number]     Height:       62.0 in Accession #:    [id number]    Weight:       120.8 lb Date of Birth:  01-29-1951     BSA:          1.543 m Patient Age:    70 years      BP:            133/75 mmHg Patient Gender: F             HR:           69 bpm. Exam Location:  Inpatient  Procedure: 2D Echo, Cardiac Doppler and Color Doppler  Indications:    Stroke  History:        Patient has no prior history of Echocardiogram examinations.  Sonographer:    Vernell Hammersmith RDCS Referring Phys: [id number] ERIC LINDZEN  IMPRESSIONS   1. Left ventricular ejection fraction, by estimation, is 60 to 65%. The left ventricle has normal function. The left ventricle has no regional wall motion abnormalities. Left ventricular diastolic parameters were normal. 2. Right ventricular systolic function is normal. The right ventricular size is normal. Tricuspid regurgitation signal is inadequate for assessing PA pressure. 3. The mitral valve is grossly normal. Trivial mitral valve regurgitation. No evidence of mitral stenosis. 4. The aortic valve is tricuspid. Aortic valve regurgitation is not visualized. No aortic stenosis is present. 5. The inferior vena cava is normal in size with greater than 50% respiratory variability, suggesting right atrial pressure of 3 mmHg.  Conclusion(s)/Recommendation(s): No intracardiac source of embolism detected on this transthoracic study. Consider a transesophageal echocardiogram to exclude cardiac source of embolism if clinically indicated.  FINDINGS Left Ventricle: Left ventricular ejection fraction, by estimation, is 60 to 65%. The left ventricle has normal function. The left ventricle has no regional wall motion abnormalities. The left ventricular internal cavity size was normal in size. There is no left ventricular hypertrophy. Left ventricular diastolic parameters were normal.  Right Ventricle: The right ventricular size is normal. No increase in right ventricular wall thickness. Right ventricular systolic function is normal. Tricuspid regurgitation signal is inadequate for assessing PA pressure.  Left Atrium: Left atrial size was normal in size.  Right  Atrium: Right atrial size was normal in size.  Pericardium: There is no evidence of pericardial effusion.  Mitral Valve: The mitral valve is grossly normal. Trivial mitral valve regurgitation. No evidence of mitral valve stenosis.  Tricuspid Valve: The tricuspid valve is grossly normal. Tricuspid valve regurgitation is not demonstrated. No evidence of tricuspid stenosis.  Aortic Valve: The aortic valve is tricuspid. Aortic valve regurgitation is not visualized. No aortic stenosis is present.  Pulmonic Valve: The pulmonic valve was grossly normal. Pulmonic valve regurgitation is not visualized. No evidence of pulmonic stenosis.  Aorta: The aortic root and ascending aorta are structurally normal, with no evidence of dilitation.  Venous: The inferior vena cava is normal in size with greater than 50% respiratory variability, suggesting right atrial pressure of 3 mmHg.  IAS/Shunts: The atrial septum is grossly normal.   LEFT VENTRICLE PLAX 2D LVIDd:         3.60 cm   Diastology LVIDs:         2.60 cm   LV e' medial:    10.30 cm/s LV PW:         0.80 cm  LV E/e' medial:  6.4 LV IVS:        0.60 cm   LV e' lateral:   8.05 cm/s LVOT diam:     1.80 cm   LV E/e' lateral: 8.2 LV SV:         56 LV SV Index:   36 LVOT Area:     2.54 cm   RIGHT VENTRICLE RV Basal diam:  2.60 cm  LEFT ATRIUM             Index        RIGHT ATRIUM           Index LA diam:        2.60 cm 1.69 cm/m   RA Area:     12.60 cm LA Vol (A2C):   50.2 ml 32.53 ml/m  RA Volume:   27.80 ml  18.02 ml/m LA Vol (A4C):   33.5 ml 21.71 ml/m LA Biplane Vol: 40.9 ml 26.51 ml/m AORTIC VALVE LVOT Vmax:   108.00 cm/s LVOT Vmean:  67.200 cm/s LVOT VTI:    0.221 m  AORTA Ao Root diam: 2.90 cm Ao Asc diam:  2.80 cm  MITRAL VALVE MV Area (PHT): 3.91 cm    SHUNTS MV Decel Time: 194 msec    Systemic VTI:  0.22 m MV E velocity: 65.70 cm/s  Systemic Diam: 1.80 cm MV A velocity: 98.50 cm/s MV E/A ratio:  0.67  Darryle Decent MD Electronically signed by Darryle Decent MD Signature Date/Time: 05/13/2022/6:21:45 PM    Final    MONITORS  CARDIAC EVENT MONITOR 01/09/2020  Narrative 30 Day Event Monitor  Quality: Fair.  Baseline artifact. Predominant rhythm: sinus rhythm Average heart rate: 69 bpm Max heart rate: 131 bpm Min heart rate: 47 bpm Pauses >2.5 seconds: none  Patient did submit a symptom diary.  She reported multiple episodes of chest pain and fluttering.  At which time most of the time sinus rhythm with noted.  She did have occasional PACs or PVCs. 4 beats of atrial tachycardia. Occasional PACs and PVCs noted.  Tiffany C. Raford, MD, [facility] 01/11/2020 4:24 PM   CT SCANS  CT CORONARY MORPH W/CTA COR W/SCORE 04/19/2023  Addendum 04/29/2023 12:19 AM ADDENDUM REPORT: 04/29/2023 00:17  EXAM: OVER-READ INTERPRETATION  CT CHEST  The following report is an over-read performed by radiologist Dr. Suzen Dials of [facility] on 04/29/2023. This over-read does not include interpretation of cardiac or coronary anatomy or pathology. The coronary calcium  score/coronary CTA interpretation by the cardiologist is attached.  COMPARISON:  August 02, 2021  FINDINGS: Cardiovascular: There are no significant extracardiac vascular findings.  Mediastinum/Nodes: There are no enlarged lymph nodes within the visualized mediastinum.  Lungs/Pleura: There is no pleural effusion. The visualized lungs appear clear.  Upper abdomen: No significant findings in the visualized upper abdomen.  Musculoskeletal/Chest wall: No chest wall mass or suspicious osseous findings within the visualized chest.  IMPRESSION: No significant extracardiac findings within the visualized chest.   Electronically Signed By: Suzen Dials M.D. On: 04/29/2023 00:17  Narrative CLINICAL DATA:  Chest pain  EXAM: Cardiac/Coronary CTA  TECHNIQUE: A non-contrast, gated CT scan was obtained with  axial slices of 3 mm through the heart for calcium  scoring. Calcium  scoring was performed using the Agatston method. A 120 kV prospective, gated, contrast cardiac scan was obtained. Gantry rotation speed was 250 msecs and collimation was 0.6 mm. Two sublingual nitroglycerin  tablets (0.8 mg) were given. The 3D data set was reconstructed  in 5% intervals of the 35-75% of the R-R cycle. Diastolic phases were analyzed on a dedicated workstation using MPR, MIP, and VRT modes. The patient received 95 cc of contrast.  FINDINGS: Image quality: Excellent.  Noise artifact is: Limited.  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 Right dominance.  Left main: Absent  Left anterior descending artery: The LAD is a large vessel that comes directly off the left coronary cusp. There is mild calcified plaque in the distal LAD with associated stenosis of 25-49% as well as a haziness below the plaque that is poorly defined. Cannot rule out soft plaque in that area. The LAD gives off a large bifurcating patent diagonal #1 branch and patent small D2.  Left circumflex artery: The LCX is non-dominant and arises anomalously from the distal RCA and is patent with no evidence of plaque or stenosis.  Right coronary artery: The RCA is dominant with normal take off from the right coronary cusp. There is mild calcified plaque in the proximal and distal RCA with associated stenosis of 25-49%. The RCA terminates as a PDA and right posterolateral branch. There is mild to moderate calcified plaque in the mid PDA with associated stenosis of 25-49% but could be >50%. The distal RCA gives rise to a small LCx artery that is patent with no stenosis.  Right Atrium: Right atrial size is within normal limits.  Right Ventricle: The right ventricular cavity is within normal limits.  Left Atrium: Left atrial size is normal in size with no left  atrial appendage filling defect.  Left Ventricle: The ventricular cavity size is within normal limits.  Pulmonary arteries: Normal in size.  Pulmonary veins: Normal pulmonary venous drainage.  Pericardium: Normal thickness without significant effusion or calcium  present.  Cardiac valves: The aortic valve is trileaflet without significant calcification. The mitral valve is normal without significant calcification.  Aorta: Normal caliber without significant disease.  Extra-cardiac findings: See attached radiology report for non-cardiac structures.  IMPRESSION: 1. Coronary calcium  score of 56.2. This was 59th percentile for age-, sex, and race-matched controls.  2. 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 Right dominance.  3. Mild atherosclerosis (25-49% proximal and distal RCA/mid RPDA/distal LAD). There is a haziness in the distal LAD that is poorly defined but cannot rule out significant soft plaque. CAD RADS 2.  4. Recommend preventive therapy and risk factor modification.  5. Consider Stress PET CT or Nuclear stress test as distal LAD is not well visualized.  RECOMMENDATIONS: 1. CAD-RADS 0: No evidence of CAD (0%). Consider non-atherosclerotic causes of chest pain.  2. CAD-RADS 1: Minimal non-obstructive CAD (0-24%). Consider non-atherosclerotic causes of chest pain. Consider preventive therapy and risk factor modification.  3. CAD-RADS 2: Mild non-obstructive CAD (25-49%). Consider non-atherosclerotic causes of chest pain. Consider preventive therapy and risk factor modification.  4. CAD-RADS 3: Moderate stenosis. Consider symptom-guided anti-ischemic pharmacotherapy as well as risk factor modification per guideline directed care. Additional analysis with CT FFR will be submitted.  5. CAD-RADS 4: Severe stenosis. (70-99% or > 50% left main). Cardiac catheterization or CT FFR is recommended. Consider  symptom-guided anti-ischemic pharmacotherapy as well as risk factor modification per guideline directed care. Invasive coronary angiography recommended with revascularization per published guideline statements.  6. CAD-RADS 5: Total coronary occlusion (100%). Consider cardiac catheterization or viability assessment. Consider symptom-guided anti-ischemic pharmacotherapy as well as risk factor modification per guideline directed care.  7. CAD-RADS N: Non-diagnostic  study. Obstructive CAD can't be excluded. Alternative evaluation is recommended.  Wilbert Bihari, MD  Electronically Signed: By: Wilbert Bihari M.D. On: 04/19/2023 18:03   CT SCANS  CT CORONARY MORPH W/CTA COR W/SCORE 08/02/2021  Addendum 08/04/2021  8:31 AM ADDENDUM REPORT: 08/04/2021 08:28  EXAM: OVER-READ INTERPRETATION  CT CHEST  The following report is an over-read performed by radiologist Dr. Norleen Kill [facility] on 08/03/2021. This over-read does not include interpretation of cardiac or coronary anatomy or pathology. The coronary calcium  score/coronary CTA interpretation by the cardiologist is attached.  COMPARISON:  None.  FINDINGS: No suspicious nodules, masses, or infiltrates are identified in the visualized portion of the lungs. No pleural fluid seen.  The visualized portions of the mediastinum and chest wall are unremarkable.  IMPRESSION: No significant non-cardiac abnormality in visualized portion of the thorax.   Electronically Signed By: Norleen DELENA Kil M.D. On: 08/04/2021 08:28  Narrative CLINICAL DATA:  73 yo female with chest pain  EXAM: Cardiac/Coronary CTA  TECHNIQUE: A non-contrast, gated CT scan was obtained with axial slices of 3 mm through the heart for calcium  scoring. Calcium  scoring was performed using the Agatston method. A 120 kV prospective, gated, contrast cardiac scan was obtained. Gantry rotation speed was 250 msecs and collimation was 0.6 mm. Two  sublingual nitroglycerin  tablets (0.8 mg) were given. The 3D data set was reconstructed in 5% intervals of the 35-75% of the R-R cycle. Diastolic phases were analyzed on a dedicated workstation using MPR, MIP, and VRT modes. The patient received 95 cc of contrast.  FINDINGS: Image quality: Average.  Noise artifact is: Limited.  Coronary Arteries:  Normal coronary origin.  Right dominance.  Left main: The left main is a large caliber vessel with a normal take off from the left coronary cusp that bifurcates to form a left anterior descending artery. There is no plaque or stenosis.  Left anterior descending artery: The LAD is patent without evidence of plaque or stenosis. The LAD gives off branching D1, smaller D2 and very smalll D3.  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  Right coronary artery: The RCA is dominant with normal take off from the right coronary cusp. There is no evidence of plaque or stenosis. The RCA gives off PDA, 2 posterolateral branches and then terminates in the left circumflex territory. There is minimal (0-24) calcified plaque in the PDA.  Right Atrium: Right atrial size is within normal limits.  Right Ventricle: The right ventricular cavity is within normal limits.  Left Atrium: Left atrial size is normal in size with no left atrial appendage filling defect.  Left Ventricle: The ventricular cavity size is within normal limits. There are no stigmata of prior infarction. There is no abnormal filling defect.  Pulmonary arteries: Normal in size.  Pulmonary veins: Normal pulmonary venous drainage.  Pericardium: Normal thickness with no significant effusion or calcium  present.  Cardiac valves: The aortic valve is trileaflet without significant calcification. The mitral valve is normal structure without significant calcification.  Aorta: Normal caliber with mild atherosclerosis.  Extra-cardiac findings: See attached  radiology report for non-cardiac structures.  IMPRESSION: 1. Coronary calcium  score of 14.4. This was 3 percentile for age-, sex, and race-matched controls.  2. Normal coronary origin with right dominance; congenitally absent left circumflex; RCA gives off PDA and 2 posterolateral branches and terminates in the left circumflex distribution.  3. Minimal plaque in the PDA.  4. Mild aortic atherosclerosis.  RECOMMENDATIONS: CAD-RADS 1: Minimal non-obstructive CAD (  0-24%). Consider non-atherosclerotic causes of chest pain. Consider preventive therapy and risk factor modification.  Redell Shallow, MD  interpretation by the cardiologist is attached.  Electronically Signed: By: Redell Shallow M.D. On: 08/02/2021 15:54     ______________________________________________________________________________________________      Risk Assessment/Calculations           Physical Exam VS:  BP 122/60   Pulse (!) 50   Ht 5' 2 (1.575 m)   Wt 102 lb 1.6 oz (46.3 kg)   LMP  (LMP Unknown)   SpO2 99%   BMI 18.67 kg/m        Wt Readings from Last 3 Encounters:  09/12/24 102 lb 1.6 oz (46.3 kg)  06/05/24 100 lb (45.4 kg)  03/28/24 100 lb (45.4 kg)    GEN: Well nourished, well developed in no acute distress NECK: No JVD; No carotid bruits CARDIAC: RRR, no murmurs, rubs, gallops RESPIRATORY:  Clear to auscultation without rales, wheezing or rhonchi  ABDOMEN: Soft, non-tender, non-distended EXTREMITIES:  No edema; No deformity   ASSESSMENT AND PLAN Falls -  a few recent falls with sudden loss of balance. No lightheadedness, dizziness, prodrome. Discussed possible repeating of PT. She will contact us  if interested in referral to outpatient PT at [facility].  HLD, LDL goal <70 - 05/2024 LCL 68. Continue Pravastatin  40mg  and 20mg  QOD, Zetia  10mg  daily. 05/2024 normal ALT.    HTN - BP well controlled. Continue current antihypertensive regimen amlodipine  10 mg daily, Imdur  30 mg daily,  lisinopril  40 mg daily.SABRA Refills provided.  Heart healthy diet and regular cardiovascular exercise encouraged.     History of CVA- ILR with no arrhythmias. Continue lipid control with Pravastatin , Zetia  as above.Continue optimal BP control with goal <130/80. Given info for stroke support group of [facility].   PAC - Well controlled on Metoprolol  tartrate 25mg  daily.  As not having significant palpitations we will continue daily dosing.   Hypokalemia - Maintained on potassium supplement.  Refill provided of potassium 20 mEq daily   Nonobstructive CAD- cardiac CTA 08/2021 with coronary calcium  score 14.4 which was 47th percentile with minimal calcified plaque in the PDA. 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 Repeat CTA 04/2019 for coronary calcium  score 56.2 placing her in the 59th percentile for age/sex/race matched control, normal coronary origin with left circumflex arising from distal RCA.  Overall mild atherosclerosis 25 to 29% in proximal and distal RCA/mid RPDA/distal LAD.  Haziness distal LAD poorly defined with subsequent Myoview  05/04/2023 with no ischemia.  Stable with no anginal symptoms. No indication for ischemic evaluation.    GDMT includes Imdur , Aspirin , Metoprolol , Pravastatin , Zetia , Imdur .  Recommend aiming for 150 minutes of moderate intensity activity per week and following a heart healthy diet.    Depression - did not start Lexapro  as prescribed. Reviewed safety and efficacy of Lexapro .  She would like to start an updated Rx for Lexapro  5 mg daily sent to her pharmacy.       Dispo: follow up in 6 months with Dr. Raford or Reche GORMAN Finder, NP   Signed, Reche GORMAN Finder, NP

## 2024-09-29 ENCOUNTER — Ambulatory Visit

## 2024-09-29 DIAGNOSIS — I639 Cerebral infarction, unspecified: Secondary | ICD-10-CM | POA: Diagnosis not present

## 2024-09-30 ENCOUNTER — Encounter

## 2024-09-30 LAB — CUP PACEART REMOTE DEVICE CHECK
Date Time Interrogation Session: 20251228002600
Implantable Pulse Generator Implant Date: 20230815
Pulse Gen Serial Number: 183424

## 2024-10-05 ENCOUNTER — Ambulatory Visit: Payer: Self-pay | Admitting: Student in an Organized Health Care Education/Training Program

## 2024-10-07 NOTE — Progress Notes (Signed)
 Remote Loop Recorder Transmission

## 2024-10-15 NOTE — Telephone Encounter (Signed)
 Patient would like a call back to review transmission. She would also like to know if she needs to start having in office ILR appointments. Please advise.

## 2024-10-17 NOTE — Telephone Encounter (Signed)
 Spoke w/ patient regarding request to review transmission and advise if in-office checks are needed. Informed patient ILR transmission was reviewed and has normal device function. No episodes or alerts noted. Patient verbalized understanding and appreciative for call.   Advised patient as long as ILR function is normal and there are no alerts, there is no need for in-clinic checks. At this time, we will continue remote monitoring and if anything is to change or an episode is noted we will be in contact with patient. Verbalized understanding and appreciative for call.   Will continue to monitor and update accordingly.

## 2024-10-17 NOTE — Progress Notes (Signed)
Attempted to contact patient. No answer, left message to call back

## 2024-10-30 ENCOUNTER — Encounter

## 2024-10-30 DIAGNOSIS — I639 Cerebral infarction, unspecified: Secondary | ICD-10-CM

## 2024-10-30 LAB — CUP PACEART REMOTE DEVICE CHECK
Date Time Interrogation Session: 20260129003600
Implantable Pulse Generator Implant Date: 20230815
Pulse Gen Serial Number: 183424

## 2024-10-31 ENCOUNTER — Encounter

## 2024-11-02 ENCOUNTER — Ambulatory Visit: Payer: Self-pay | Admitting: Student in an Organized Health Care Education/Training Program

## 2024-11-04 ENCOUNTER — Ambulatory Visit (HOSPITAL_BASED_OUTPATIENT_CLINIC_OR_DEPARTMENT_OTHER): Admitting: Family

## 2024-11-07 NOTE — Progress Notes (Signed)
 Remote Loop Recorder Transmission

## 2024-12-01 ENCOUNTER — Encounter

## 2024-12-30 ENCOUNTER — Ambulatory Visit: Admitting: Neurology

## 2025-01-01 ENCOUNTER — Encounter

## 2025-03-24 ENCOUNTER — Ambulatory Visit (HOSPITAL_BASED_OUTPATIENT_CLINIC_OR_DEPARTMENT_OTHER): Admitting: Family
